# Patient Record
Sex: Female | Born: 1935 | Race: White | Hispanic: No | Smoking: Never smoker
Health system: Southern US, Community
[De-identification: ages and names within clinical notes are randomized; demographics above are authoritative.]

## PROBLEM LIST (undated history)

## (undated) DIAGNOSIS — I10 Essential (primary) hypertension: Secondary | ICD-10-CM

## (undated) DIAGNOSIS — I509 Heart failure, unspecified: Secondary | ICD-10-CM

## (undated) DIAGNOSIS — I4891 Unspecified atrial fibrillation: Secondary | ICD-10-CM

## (undated) DIAGNOSIS — E119 Type 2 diabetes mellitus without complications: Secondary | ICD-10-CM

## (undated) DIAGNOSIS — I639 Cerebral infarction, unspecified: Secondary | ICD-10-CM

## (undated) HISTORY — PX: CHOLECYSTECTOMY: SHX55

## (undated) HISTORY — DX: Cerebral infarction, unspecified: I63.9

---

## 2009-04-05 ENCOUNTER — Inpatient Hospital Stay: Payer: Self-pay | Admitting: Specialist

## 2009-05-02 ENCOUNTER — Inpatient Hospital Stay: Payer: Self-pay | Admitting: Internal Medicine

## 2009-06-04 ENCOUNTER — Ambulatory Visit: Payer: Self-pay | Admitting: Family

## 2009-07-02 ENCOUNTER — Ambulatory Visit: Payer: Self-pay | Admitting: Family

## 2009-07-15 ENCOUNTER — Ambulatory Visit: Payer: Self-pay | Admitting: Unknown Physician Specialty

## 2009-09-17 ENCOUNTER — Ambulatory Visit: Payer: Self-pay | Admitting: Family

## 2010-02-04 ENCOUNTER — Ambulatory Visit: Payer: Self-pay | Admitting: Family

## 2010-03-23 ENCOUNTER — Ambulatory Visit: Payer: Self-pay | Admitting: Family Medicine

## 2010-05-06 ENCOUNTER — Ambulatory Visit: Payer: Self-pay | Admitting: Family

## 2010-07-29 ENCOUNTER — Ambulatory Visit: Payer: Self-pay | Admitting: Family

## 2011-08-03 ENCOUNTER — Ambulatory Visit: Payer: Self-pay | Admitting: Vascular Surgery

## 2011-08-03 LAB — BASIC METABOLIC PANEL
Anion Gap: 10 (ref 7–16)
BUN: 16 mg/dL (ref 7–18)
Calcium, Total: 9.5 mg/dL (ref 8.5–10.1)
Creatinine: 1.07 mg/dL (ref 0.60–1.30)
EGFR (African American): 56 — ABNORMAL LOW
EGFR (Non-African Amer.): 48 — ABNORMAL LOW
Glucose: 149 mg/dL — ABNORMAL HIGH (ref 65–99)
Sodium: 141 mmol/L (ref 136–145)

## 2011-08-10 ENCOUNTER — Ambulatory Visit: Payer: Self-pay | Admitting: Vascular Surgery

## 2012-01-18 ENCOUNTER — Ambulatory Visit: Payer: Self-pay | Admitting: Unknown Physician Specialty

## 2012-07-03 ENCOUNTER — Ambulatory Visit: Payer: Self-pay | Admitting: Family Medicine

## 2012-07-19 ENCOUNTER — Ambulatory Visit: Payer: Self-pay | Admitting: Unknown Physician Specialty

## 2013-07-31 ENCOUNTER — Ambulatory Visit: Payer: Self-pay | Admitting: Unknown Physician Specialty

## 2013-11-08 DIAGNOSIS — E1129 Type 2 diabetes mellitus with other diabetic kidney complication: Secondary | ICD-10-CM | POA: Insufficient documentation

## 2013-11-08 DIAGNOSIS — E559 Vitamin D deficiency, unspecified: Secondary | ICD-10-CM | POA: Insufficient documentation

## 2014-08-04 NOTE — Op Note (Signed)
PATIENT NAME:  [name], [name] MR#:  [id number] DATE OF BIRTH:  07/17/1925  DATE OF PROCEDURE:  08/10/2011  PREOPERATIVE DIAGNOSIS: Atherosclerotic occlusive disease bilateral lower extremities with rest pain left lower extremity.   POSTOPERATIVE DIAGNOSIS: Atherosclerotic occlusive disease bilateral lower extremities with rest pain left lower extremity.   PROCEDURES PERFORMED:  1. Abdominal aortogram.  2. Bilateral lower extremity runoff, third order catheter placement left lower extremity.  3. Percutaneous transluminal angioplasty and stent placement left superficial femoral artery.  4. Percutaneous transluminal angioplasty of the peroneal to 3 mm.   PROCEDURE PERFORMED BY: Katha Cabal, MD   SEDATION: Versed 4 mg plus fentanyl 150 mcg administered IV. Continuous ECG, pulse oximetry, and cardiopulmonary monitoring was performed throughout the entire procedure by the interventional radiology nurse. Total sedation time was 45 minutes.   ACCESS: 6 French sheath, right common femoral artery.   FLUORO TIME: 13.6 minutes.   CONTRAST: Isovue 112 mL.   INDICATIONS: Ms. [name] is an 81 year old woman who presented to the office with increasing pain in her left lower extremity. Physical examination as well as noninvasive studies demonstrated significant deterioration of her arterial status and her ABI is now 0.39 consistent with critical limb ischemia. The risks and benefits for angiography with the hope for intervention were reviewed. All questions are answered. The patient agrees to proceed.   PROCEDURE: The patient is taken to the Special Procedures Suite and placed in the supine position. After adequate sedation is achieved, both groins are prepped and draped in a sterile fashion. 1% lidocaine is infiltrated in the soft tissues overlying the right groin area. Ultrasound is placed in a sterile sleeve. Ultrasound is utilized secondary to lack of appropriate landmarks and to avoid vascular injury.  Under direct ultrasound visualization, the common femoral artery is identified. It is echolucent, homogeneous, and pulsatile indicating patency. Image is recorded for the permanent record. Micropuncture needle is inserted into the anterior wall and microwire followed by micro sheath, J-wire followed by 5 French sheath and 5 French pigtail catheter. Pigtail catheter is positioned at the level of T12 and bolus injection of contrast is utilized to demonstrate the aorta in an AP projection. The pigtail catheter is then repositioned to above the bifurcation and an RAO projection is obtained. Pigtail catheter and stiff angled Glidewire are then used to cross the aortic bifurcation. The pigtail catheter is positioned in the SFA. This was after AP projection of the femorals down through the knee level is obtained bilaterally. Using a stiff angled Glidewire, the 5 [place] sheath is exchanged for a 6 [place] Ansel. 4000 units of heparin is given and an additional 1000 units of heparin is given later in the case. Using a CXI 0.035 and the stiff angled Glidewire, the SFA and popliteal occlusion is crossed. Confirmation of intraluminal positioning is made with the CXI catheter. High-grade greater than 80% stenosis of the peroneal is noted as well distally. Peroneal is patent. Anterior tibial and posterior tibial are occluded.   0.014 wire is then advanced through the CXI catheter and a 3 x 6 balloon is advanced across the peroneal lesion and inflated to 14 atmospheres for one minute. A 4 x 30 Ultraverse balloon is then used to angioplasty from the mid popliteal up to the common femoral artery. Follow-up angiography demonstrates the SFA and popliteal are now patent but there is a flow-limiting dissection in the mid popliteal and this is covered with a 6 x 60 LifeStent. This is postdilated with a 5 x  6 balloon. Follow-up angiography of the more proximal SFA demonstrates several discrete areas that are still under dilated and a 5 x  22 Ultraverse balloon is used to dilate the SFA from the level of Hunter's canal to the common femoral artery. Follow-up angiography now demonstrates wide patency of the SFA throughout its entire course. There is a non-flow limiting dissection at Hunter's canal. There is patency of the stent which is well approximated in the popliteal and there is now patency of the peroneal artery in its proximal portion down to the foot.   Sheath is pulled into the right groin, oblique view is obtained, and a StarClose device deployed successfully without difficulty. There are no immediate complications.   INTERPRETATION: The abdominal aorta is opacified with a bolus injection of contrast. The aorta, bilateral common iliac and external iliac arteries are widely patent. Common femoral and profunda femoris are patent bilaterally. SFA on the right demonstrates several subtotal occlusions in Hunter's canal and there is poor visualization of the mid popliteal and distally.   On the left there is diffuse disease throughout the SFA which occludes at Hunter's canal and remains occluded into the mid popliteal at the level of the femoral condyles. The tibial vessels are severely diseased with occlusions of the anterior tibial and posterior tibial throughout their entire course. Peroneal is patent but has a greater than 80 to 90% stenosis in its proximal one third.   Following angioplasty to 3 mm, the peroneal artery is well treated and is widely patent. Following angioplasty to 4 mm, there is a flow-limiting dissection of the popliteal which is treated with a stent and there are still multiple undersized areas. However, following angioplasty of the SFA and popliteal to 5 mm, there is now complete resolution of the flow-limiting dissection status post stent and there is also patency of the SFA throughout its course.   SUMMARY: Successful recanalization of the left lower extremity as described above.    ____________________________ Katha Cabal, MD ggs:drc D: 08/10/2011 10:39:36 ET T: 08/10/2011 11:07:09 ET JOB#: [id number]  cc: Katha Cabal, MD, <Dictator> Sofie Hartigan, MD Katha Cabal MD ELECTRONICALLY SIGNED 08/20/2011 7:51

## 2014-09-19 DIAGNOSIS — N1832 Chronic kidney disease, stage 3b: Secondary | ICD-10-CM | POA: Insufficient documentation

## 2015-10-30 DIAGNOSIS — I340 Nonrheumatic mitral (valve) insufficiency: Secondary | ICD-10-CM | POA: Insufficient documentation

## 2016-12-31 ENCOUNTER — Other Ambulatory Visit (INDEPENDENT_AMBULATORY_CARE_PROVIDER_SITE_OTHER): Payer: Self-pay | Admitting: Vascular Surgery

## 2016-12-31 DIAGNOSIS — I779 Disorder of arteries and arterioles, unspecified: Secondary | ICD-10-CM

## 2016-12-31 DIAGNOSIS — I739 Peripheral vascular disease, unspecified: Secondary | ICD-10-CM

## 2017-01-10 ENCOUNTER — Encounter (INDEPENDENT_AMBULATORY_CARE_PROVIDER_SITE_OTHER): Payer: Self-pay

## 2017-01-10 ENCOUNTER — Ambulatory Visit (INDEPENDENT_AMBULATORY_CARE_PROVIDER_SITE_OTHER): Payer: Self-pay | Admitting: Vascular Surgery

## 2017-05-30 ENCOUNTER — Emergency Department
Admission: EM | Admit: 2017-05-30 | Discharge: 2017-05-30 | Disposition: A | Discharge status: Home / Self Care | Payer: Medicare Other | Attending: Emergency Medicine | Admitting: Emergency Medicine

## 2017-05-30 ENCOUNTER — Emergency Department: Payer: Medicare Other

## 2017-05-30 ENCOUNTER — Encounter: Payer: Self-pay | Admitting: Emergency Medicine

## 2017-05-30 DIAGNOSIS — R079 Chest pain, unspecified: Secondary | ICD-10-CM | POA: Insufficient documentation

## 2017-05-30 DIAGNOSIS — I110 Hypertensive heart disease with heart failure: Secondary | ICD-10-CM | POA: Insufficient documentation

## 2017-05-30 DIAGNOSIS — I509 Heart failure, unspecified: Secondary | ICD-10-CM | POA: Diagnosis not present

## 2017-05-30 DIAGNOSIS — E119 Type 2 diabetes mellitus without complications: Secondary | ICD-10-CM | POA: Diagnosis not present

## 2017-05-30 HISTORY — DX: Type 2 diabetes mellitus without complications: E11.9

## 2017-05-30 HISTORY — DX: Unspecified atrial fibrillation: I48.91

## 2017-05-30 HISTORY — DX: Heart failure, unspecified: I50.9

## 2017-05-30 HISTORY — DX: Essential (primary) hypertension: I10

## 2017-05-30 LAB — BASIC METABOLIC PANEL
Anion gap: 10 (ref 5–15)
BUN: 17 mg/dL (ref 6–20)
CO2: 28 mmol/L (ref 22–32)
CREATININE: 1.01 mg/dL — AB (ref 0.44–1.00)
Calcium: 8.9 mg/dL (ref 8.9–10.3)
Chloride: 101 mmol/L (ref 101–111)
GFR calc non Af Amer: 48 mL/min — ABNORMAL LOW (ref >60)
GFR, EST AFRICAN AMERICAN: 55 mL/min — AB (ref >60)
Glucose, Bld: 111 mg/dL — ABNORMAL HIGH (ref 65–99)
Potassium: 3.6 mmol/L (ref 3.5–5.1)
Sodium: 139 mmol/L (ref 135–145)

## 2017-05-30 LAB — CBC
HEMATOCRIT: 46.5 % (ref 35.0–47.0)
HEMOGLOBIN: 15.5 g/dL (ref 12.0–16.0)
MCH: 32.3 pg (ref 26.0–34.0)
MCHC: 33.3 g/dL (ref 32.0–36.0)
MCV: 97.1 fL (ref 80.0–100.0)
Platelets: 198 10*3/uL (ref 150–440)
RBC: 4.79 MIL/uL (ref 3.80–5.20)
RDW: 13.5 % (ref 11.5–14.5)
WBC: 7.1 10*3/uL (ref 3.6–11.0)

## 2017-05-30 LAB — TROPONIN I: Troponin I: <0.03 ng/mL (ref <0.03)

## 2017-05-30 LAB — PROTIME-INR
INR: 2.24
Prothrombin Time: 24.6 seconds — ABNORMAL HIGH (ref 11.4–15.2)

## 2017-05-30 MED ORDER — ACETAMINOPHEN 500 MG PO TABS
1000.0000 mg | ORAL_TABLET | Freq: Once | ORAL | Status: AC
  Administered 2017-05-30: 1000 mg via ORAL
  Filled 2017-05-30: qty 2

## 2017-05-30 MED ORDER — ONDANSETRON 4 MG PO TBDP
4.0000 mg | ORAL_TABLET | Freq: Once | ORAL | Status: AC
  Administered 2017-05-30: 4 mg via ORAL
  Filled 2017-05-30: qty 1

## 2017-05-30 NOTE — ED Provider Notes (Signed)
[facility] Emergency Department Provider Note       Time seen: ----------------------------------------- 1:30 PM on 05/30/2017 -----------------------------------------   I have reviewed the triage vital signs and the nursing notes.  HISTORY   Chief Complaint Chest Pain and Shortness of Breath    HPI Consepcion Utt Carlson is a 84 y.o. female with a history of atrial fibrillation, CHF, diabetes and hypertension who presents to the ED for chest pain or shortness of breath.  Discomfort is 8 out of 10 on the right and left.  Family describes somewhat of a poor appetite since yesterday.  Patient has chest pain radiating to the right scapular area with a dry cough.  Past Medical History:  Diagnosis Date  . A-fib ([place])   . CHF (congestive heart failure) ([place])   . Diabetes mellitus without complication ([place])   . Hypertension     There are no active problems to display for this patient.   Past Surgical History:  Procedure Laterality Date  . CHOLECYSTECTOMY      Allergies Sulfa antibiotics  Social History Social History   Tobacco Use  . Smoking status: Never Smoker  . Smokeless tobacco: Never Used  Substance Use Topics  . Alcohol use: No    Frequency: Never  . Drug use: No    Review of Systems Constitutional: Negative for fever. Cardiovascular: Positive for chest pain Respiratory: Negative for shortness of breath.  Positive for dry cough Gastrointestinal: Negative for abdominal pain, vomiting and diarrhea. Genitourinary: Negative for dysuria. Musculoskeletal: Negative for back pain. Skin: Negative for rash. Neurological: Negative for headaches, focal weakness or numbness.  All systems negative/normal/unremarkable except as stated in the HPI  ____________________________________________   PHYSICAL EXAM:  VITAL SIGNS: ED Triage Vitals  Enc Vitals Group     BP 05/30/17 1100 (!) 184/70     Pulse Rate 05/30/17 1100 84     Resp 05/30/17 1100  20     Temp 05/30/17 1100 98.2 F (36.8 C)     Temp Source 05/30/17 1100 Oral     SpO2 05/30/17 1100 96 %     Weight 05/30/17 1101 132 lb (59.9 kg)     Height --      Head Circumference --      Peak Flow --      Pain Score 05/30/17 1100 9     Pain Loc --      Pain Edu? --      Excl. in [place]? --     Constitutional: Alert and oriented. Well appearing and in no distress. Eyes: Conjunctivae are normal. Normal extraocular movements. ENT   Head: Normocephalic and atraumatic.   Nose: No congestion/rhinnorhea.   Mouth/Throat: Mucous membranes are moist.   Neck: No stridor. Cardiovascular: Normal rate, regular rhythm.  Slight systolic murmur noted Respiratory: Normal respiratory effort without tachypnea nor retractions. Breath sounds are clear and equal bilaterally. No wheezes/rales/rhonchi. Gastrointestinal: Soft and nontender. Normal bowel sounds Musculoskeletal: Nontender with normal range of motion in extremities. No lower extremity tenderness nor edema. Neurologic:  Normal speech and language. No gross focal neurologic deficits are appreciated.  Skin:  Skin is warm, dry and intact. No rash noted. Psychiatric: Mood and affect are normal. Speech and behavior are normal.  ____________________________________________  EKG: Interpreted by me.  Atrial fibrillation with a rate of 87 bpm, possible septal infarct age-indeterminate, normal QRS size, normal QT  ____________________________________________  ED COURSE:  As part of my medical decision making, I reviewed the following data  within the [place] History obtained from family if available, nursing notes, old chart and ekg, as well as notes from prior ED visits. Patient presented for chest pain or shortness of breath, we will assess with labs and imaging as indicated at this time. Clinical Course as of May 30 1328  Mon May 30, 2017  1309 Troponin I: <0.03 [RN]    Clinical Course User Index [RN] Dia Sitter, Student-PA   Procedures ____________________________________________   LABS (pertinent positives/negatives)  Labs Reviewed  BASIC METABOLIC PANEL - Abnormal; Notable for the following components:      Result Value   Glucose, Bld 111 (*)    Creatinine, Ser 1.01 (*)    GFR calc non Af Amer 48 (*)    GFR calc Af Amer 55 (*)    All other components within normal limits  PROTIME-INR - Abnormal; Notable for the following components:   Prothrombin Time 24.6 (*)    All other components within normal limits  CBC  TROPONIN I  TROPONIN I    RADIOLOGY Images were viewed by me  Chest x-ray IMPRESSION: Cardiomegaly. COPD/chronic changes. No active disease. ____________________________________________  DIFFERENTIAL DIAGNOSIS   Unstable angina, PE, pneumonia, dissection, pneumothorax, musculoskeletal pain, GERD  FINAL ASSESSMENT AND PLAN  Chest pain, shortness of breath   Plan: Patient had presented for chest pain and shortness of breath. Patient's labs were reassuring. Patient's imaging are also reassuring.  No clear etiology for her symptoms.  She is cleared for close outpatient follow-up.   Laurence Aly, MD   Note: This note was generated in part or whole with voice recognition software. Voice recognition is usually quite accurate but there are transcription errors that can and very often do occur. I apologize for any typographical errors that were not detected and corrected.     Earleen Newport, MD 05/30/17 [phone]

## 2017-05-30 NOTE — ED Triage Notes (Signed)
Pt with chest pain and shortness of breath.

## 2017-06-22 DIAGNOSIS — I350 Nonrheumatic aortic (valve) stenosis: Secondary | ICD-10-CM | POA: Insufficient documentation

## 2017-06-22 DIAGNOSIS — I4819 Other persistent atrial fibrillation: Secondary | ICD-10-CM | POA: Insufficient documentation

## 2017-07-18 ENCOUNTER — Encounter (INDEPENDENT_AMBULATORY_CARE_PROVIDER_SITE_OTHER): Payer: Self-pay | Admitting: Vascular Surgery

## 2017-07-18 ENCOUNTER — Ambulatory Visit (INDEPENDENT_AMBULATORY_CARE_PROVIDER_SITE_OTHER): Payer: Medicare Other | Admitting: Vascular Surgery

## 2017-07-18 DIAGNOSIS — I739 Peripheral vascular disease, unspecified: Secondary | ICD-10-CM

## 2017-07-18 DIAGNOSIS — E1151 Type 2 diabetes mellitus with diabetic peripheral angiopathy without gangrene: Secondary | ICD-10-CM

## 2017-07-18 DIAGNOSIS — I10 Essential (primary) hypertension: Secondary | ICD-10-CM

## 2017-07-18 DIAGNOSIS — Z794 Long term (current) use of insulin: Secondary | ICD-10-CM | POA: Diagnosis not present

## 2017-07-18 DIAGNOSIS — E782 Mixed hyperlipidemia: Secondary | ICD-10-CM | POA: Diagnosis not present

## 2017-07-18 DIAGNOSIS — M79605 Pain in left leg: Secondary | ICD-10-CM | POA: Diagnosis not present

## 2017-07-19 ENCOUNTER — Encounter (INDEPENDENT_AMBULATORY_CARE_PROVIDER_SITE_OTHER): Payer: Self-pay | Admitting: Vascular Surgery

## 2017-07-19 DIAGNOSIS — E119 Type 2 diabetes mellitus without complications: Secondary | ICD-10-CM | POA: Insufficient documentation

## 2017-07-19 DIAGNOSIS — I739 Peripheral vascular disease, unspecified: Secondary | ICD-10-CM | POA: Insufficient documentation

## 2017-07-19 DIAGNOSIS — M79605 Pain in left leg: Secondary | ICD-10-CM | POA: Insufficient documentation

## 2017-07-19 DIAGNOSIS — E1122 Type 2 diabetes mellitus with diabetic chronic kidney disease: Secondary | ICD-10-CM | POA: Insufficient documentation

## 2017-07-19 DIAGNOSIS — I10 Essential (primary) hypertension: Secondary | ICD-10-CM | POA: Insufficient documentation

## 2017-07-19 DIAGNOSIS — E785 Hyperlipidemia, unspecified: Secondary | ICD-10-CM | POA: Insufficient documentation

## 2017-07-19 NOTE — Progress Notes (Signed)
MRN : [id number]  [name] is a 84 y.o. (Nov 28, 1925) female who presents with chief complaint of  Chief Complaint  Patient presents with  . Follow-up    Left leg painful  .  History of Present Illness: The patient is seen for evaluation of painful left lower extremity. Patient notes the pain occurred abruptly and is located in the left lateral thigh area.  She saw her primary who thought it was a hematoma.  She denies any black and bleu appearing at the knee level  The patient denies rest pain or dangling of an extremity off the side of the bed during the night for relief. No open wounds or sores at this time. No history of DVT or phlebitis. No prior interventions or surgeries.  There is a  history of back problems and DJD of the lumbar and sacral spine.    Current Meds  Medication Sig  . allopurinol (ZYLOPRIM) 100 MG tablet Take 100 mg by mouth daily.  Marland Kitchen diltiazem (CARDIZEM CD) 120 MG 24 hr capsule Take 1 capsule by mouth daily.  . furosemide (LASIX) 20 MG tablet Take 20 mg by mouth 2 (two) times daily.  Marland Kitchen HUMALOG KWIKPEN 100 UNIT/ML KiwkPen Inject 8-10 Units into the skin 3 (three) times daily before meals.  Marland Kitchen LEVEMIR FLEXTOUCH 100 UNIT/ML Pen   . levothyroxine (SYNTHROID, LEVOTHROID) 88 MCG tablet Take 88 mcg by mouth every morning.  Marland Kitchen lisinopril (PRINIVIL,ZESTRIL) 40 MG tablet Take 40 mg by mouth daily.  Marland Kitchen lovastatin (MEVACOR) 20 MG tablet Take 20 mg by mouth at bedtime.  . meclizine (ANTIVERT) 25 MG tablet   . metFORMIN (GLUCOPHAGE) 500 MG tablet Take 500 mg by mouth 2 (two) times daily.  Marland Kitchen warfarin (COUMADIN) 2 MG tablet Take 2 mg by mouth daily.    Past Medical History:  Diagnosis Date  . A-fib ([place])   . CHF (congestive heart failure) ([place])   . Diabetes mellitus without complication ([place])   . Hypertension     Past Surgical History:  Procedure Laterality Date  . CHOLECYSTECTOMY      Social History Social History   Tobacco Use  . Smoking status: Never  Smoker  . Smokeless tobacco: Never Used  Substance Use Topics  . Alcohol use: No    Frequency: Never  . Drug use: No    Family History History reviewed. No pertinent family history. No family history of bleeding/clotting disorders, porphyria or autoimmune disease   Allergies  Allergen Reactions  . Sulfa Antibiotics      REVIEW OF SYSTEMS (Negative unless checked)  Constitutional: [] Weight loss  [] Fever  [] Chills Cardiac: [] Chest pain   [] Chest pressure   [] Palpitations   [] Shortness of breath when laying flat   [] Shortness of breath with exertion. Vascular:  [] Pain in legs with walking   [x] Pain in legs at rest  [] History of DVT   [] Phlebitis   [x] Swelling in legs   [] Varicose veins   [] Non-healing ulcers Pulmonary:   [] Uses home oxygen   [] Productive cough   [] Hemoptysis   [] Wheeze  [] COPD   [] Asthma Neurologic:  [] Dizziness   [] Seizures   [] History of stroke   [] History of TIA  [] Aphasia   [] Vissual changes   [] Weakness or numbness in arm   [] Weakness or numbness in leg Musculoskeletal:   [] Joint swelling   [x] Joint pain   [x] Low back pain Hematologic:  [] Easy bruising  [] Easy bleeding   [] Hypercoagulable state   [] Anemic Gastrointestinal:  [] Diarrhea   []   Vomiting  [] Gastroesophageal reflux/heartburn   [] Difficulty swallowing. Genitourinary:  [] Chronic kidney disease   [] Difficult urination  [] Frequent urination   [] Blood in urine Skin:  [] Rashes   [] Ulcers  Psychological:  [] History of anxiety   []  History of major depression.  Physical Examination  Vitals:   07/18/17 1000  BP: (!) 176/95  Pulse: 93  Resp: 14  Weight: 58.1 kg (128 lb)  Height: 5\' 3"  (1.6 m)   Body mass index is 22.67 kg/m. Gen: WD/WN, NAD Head: [place]/AT, No temporalis wasting.  Ear/Nose/Throat: Hearing grossly intact, nares w/o erythema or drainage, poor dentition Eyes: PER, EOMI, sclera nonicteric.  Neck: Supple, no masses.  No bruit or JVD.  Pulmonary:  Good air movement, clear to auscultation  bilaterally, no use of accessory muscles.  Cardiac: RRR, normal S1, S2, no Murmurs. Vascular:  Vessel Right Left  Radial Palpable Palpable  PT Not Palpable Not Palpable  DP Not Palpable Not Palpable  Gastrointestinal: soft, non-distended. No guarding/no peritoneal signs.  Musculoskeletal: M/S 5/5 throughout.  No deformity or atrophy.  Neurologic: CN 2-12 intact. Pain and light touch intact in extremities.  Symmetrical.  Speech is fluent. Motor exam as listed above. Psychiatric: Judgment intact, Mood & affect appropriate for pt's clinical situation. Dermatologic: No rashes or ulcers noted.  No changes consistent with cellulitis. Lymph : No Cervical lymphadenopathy, no lichenification or skin changes of chronic lymphedema.  CBC Lab Results  Component Value Date   WBC 7.1 05/30/2017   HGB 15.5 05/30/2017   HCT 46.5 05/30/2017   MCV 97.1 05/30/2017   PLT 198 05/30/2017    BMET    Component Value Date/Time   NA 139 05/30/2017 1118   NA 141 08/03/2011 0729   K 3.6 05/30/2017 1118   K 3.6 08/03/2011 0729   CL 101 05/30/2017 1118   CL 105 08/03/2011 0729   CO2 28 05/30/2017 1118   CO2 26 08/03/2011 0729   GLUCOSE 111 (H) 05/30/2017 1118   GLUCOSE 149 (H) 08/03/2011 0729   BUN 17 05/30/2017 1118   BUN 16 08/03/2011 0729   CREATININE 1.01 (H) 05/30/2017 1118   CREATININE 1.07 08/03/2011 0729   CALCIUM 8.9 05/30/2017 1118   CALCIUM 9.5 08/03/2011 0729   GFRNONAA 48 (L) 05/30/2017 1118   GFRNONAA 48 (L) 08/03/2011 0729   GFRAA 55 (L) 05/30/2017 1118   GFRAA 56 (L) 08/03/2011 0729   CrCl cannot be calculated (Patient's most recent lab result is older than the maximum 21 days allowed.).  COAG Lab Results  Component Value Date   INR 2.24 05/30/2017    Radiology No results found.   Assessment/Plan 1. Leg pain, lateral, left Recommend:  Patient should undergo arterial duplex of the lower extremity ASAP because there has been a significant deterioration in the patient's  lower extremity symptoms.  The patient states they are having increased pain and a marked decrease in the distance that they can walk.  The risks and benefits as well as the alternatives were discussed in detail with the patient.  All questions were answered.  Patient agrees to proceed and understands this could be a prelude to angiography and intervention.  The patient will follow up with me in the office to review the studies.   - VAS [place] LOWER EXTREMITY ARTERIAL DUPLEX; Future  2. PAD (peripheral artery disease) ([place]) See #1 - VAS [place] ABI WITH/WO TBI; Future  3. Type 2 diabetes mellitus with diabetic peripheral angiopathy without gangrene, with long-term current use of insulin (  [place]) Continue hypoglycemic medications as already ordered, these medications have been reviewed and there are no changes at this time.  Hgb A1C to be monitored as already arranged by primary service   4. Mixed hyperlipidemia Continue statin as ordered and reviewed, no changes at this time   5. Essential hypertension Continue antihypertensive medications as already ordered, these medications have been reviewed and there are no changes at this time.     [name], MD  07/19/2017 9:27 AM

## 2017-07-20 ENCOUNTER — Ambulatory Visit (INDEPENDENT_AMBULATORY_CARE_PROVIDER_SITE_OTHER): Payer: Medicare Other

## 2017-07-20 ENCOUNTER — Encounter (INDEPENDENT_AMBULATORY_CARE_PROVIDER_SITE_OTHER): Payer: Self-pay | Admitting: Vascular Surgery

## 2017-07-20 ENCOUNTER — Ambulatory Visit (INDEPENDENT_AMBULATORY_CARE_PROVIDER_SITE_OTHER): Payer: Medicare Other | Admitting: Vascular Surgery

## 2017-07-20 VITALS — BP 177/93 | HR 74 | Resp 16 | Ht 63.0 in | Wt 127.6 lb

## 2017-07-20 DIAGNOSIS — I10 Essential (primary) hypertension: Secondary | ICD-10-CM

## 2017-07-20 DIAGNOSIS — I739 Peripheral vascular disease, unspecified: Secondary | ICD-10-CM | POA: Diagnosis not present

## 2017-07-20 DIAGNOSIS — M79605 Pain in left leg: Secondary | ICD-10-CM

## 2017-07-20 DIAGNOSIS — Z794 Long term (current) use of insulin: Secondary | ICD-10-CM | POA: Diagnosis not present

## 2017-07-20 DIAGNOSIS — E1151 Type 2 diabetes mellitus with diabetic peripheral angiopathy without gangrene: Secondary | ICD-10-CM

## 2017-07-20 DIAGNOSIS — E782 Mixed hyperlipidemia: Secondary | ICD-10-CM

## 2017-07-20 NOTE — Progress Notes (Signed)
Subjective:    Patient ID: [name], female    DOB: 1925/10/09, 84 y.o.   MRN: [id number] Chief Complaint  Patient presents with  . Follow-up    pt conv abi,lle art   Patient was last seen on July 18, 2017 for evaluation of left lower extremity pain.  The patient presents today to review vascular studies.  The patient's left thigh pain is stable.  The patient underwent a bilateral ABI which was notable for monophasic tibials and abnormal great toe waveforms bilaterally.  A right lower extremity arterial duplex was notable for a 75-99% stenosis at the distal SFA with monophasic blood flow distally.  A left lower extremity arterial duplex was notable for biphasic blood flow transitioning to monophasic at the distal popliteal artery where a 50-74% stenosis was noted in the popliteal stent.  Left posterior tibial artery is occluded.  The patient denies any rest pain or ulceration to the bilateral lower extremity.  The patient denies any fever, nausea vomiting.  Review of Systems  Constitutional: Negative.   HENT: Negative.   Eyes: Negative.   Respiratory: Negative.   Cardiovascular:       Left thigh pain Peripheral artery disease  Gastrointestinal: Negative.   Endocrine: Negative.   Genitourinary: Negative.   Musculoskeletal: Negative.   Skin: Negative.   Allergic/Immunologic: Negative.   Neurological: Negative.   Hematological: Negative.   Psychiatric/Behavioral: Negative.       Objective:   Physical Exam  Constitutional: She is oriented to person, place, and time. She appears well-developed and well-nourished. No distress.  HENT:  Head: Normocephalic and atraumatic.  Eyes: Pupils are equal, round, and reactive to light. Conjunctivae are normal.  Neck: Normal range of motion.  Cardiovascular: Normal rate, regular rhythm, normal heart sounds and intact distal pulses.  Pulses:      Radial pulses are 2+ on the right side, and 2+ on the left side.  Hard to palpate pedal pulses  however the bilateral feet are warm  Pulmonary/Chest: Effort normal and breath sounds normal.  Musculoskeletal: Normal range of motion.  Neurological: She is alert and oriented to person, place, and time.  Skin: Skin is warm and dry. She is not diaphoretic.  Psychiatric: She has a normal mood and affect. Her behavior is normal. Judgment and thought content normal.  Vitals reviewed.  BP (!) 177/93 (BP Location: Right Arm)   Pulse 74   Resp 16   Ht 5\' 3"  (1.6 m)   Wt 127 lb 9.6 oz (57.9 kg)   BMI 22.60 kg/m   Past Medical History:  Diagnosis Date  . A-fib ([place])   . CHF (congestive heart failure) ([place])   . Diabetes mellitus without complication ([place])   . Hypertension    Social History   Socioeconomic History  . Marital status: Married    Spouse name: Not on file  . Number of children: Not on file  . Years of education: Not on file  . Highest education level: Not on file  Occupational History  . Not on file  Social Needs  . Financial resource strain: Not on file  . Food insecurity:    Worry: Not on file    Inability: Not on file  . Transportation needs:    Medical: Not on file    Non-medical: Not on file  Tobacco Use  . Smoking status: Never Smoker  . Smokeless tobacco: Never Used  Substance and Sexual Activity  . Alcohol use: No    Frequency:  Never  . Drug use: No  . Sexual activity: Not on file  Lifestyle  . Physical activity:    Days per week: Not on file    Minutes per session: Not on file  . Stress: Not on file  Relationships  . Social connections:    Talks on phone: Not on file    Gets together: Not on file    Attends religious service: Not on file    Active member of club or organization: Not on file    Attends meetings of clubs or organizations: Not on file    Relationship status: Not on file  . Intimate partner violence:    Fear of current or ex partner: Not on file    Emotionally abused: Not on file    Physically abused: Not on file    Forced  sexual activity: Not on file  Other Topics Concern  . Not on file  Social History Narrative  . Not on file   Past Surgical History:  Procedure Laterality Date  . CHOLECYSTECTOMY     No family history on file.  Allergies  Allergen Reactions  . Sulfa Antibiotics       Assessment & Plan:  Patient was last seen on July 18, 2017 for evaluation of left lower extremity pain.  The patient presents today to review vascular studies.  The patient's left thigh pain is stable.  The patient underwent a bilateral ABI which was notable for monophasic tibials and abnormal great toe waveforms bilaterally.  A right lower extremity arterial duplex was notable for a 75-99% stenosis at the distal SFA with monophasic blood flow distally.  A left lower extremity arterial duplex was notable for biphasic blood flow transitioning to monophasic at the distal popliteal artery where a 50-74% stenosis was noted in the popliteal stent.  Left posterior tibial artery is occluded.  The patient denies any rest pain or ulceration to the bilateral lower extremity.  The patient denies any fever, nausea vomiting.  1. PAD (peripheral artery disease) ([place]) - Stable Patient with a past medical history of peripheral artery disease with endovascular intervention to the left popliteal artery with stent Patient noted to have worsening disease to the bilateral lower extremity on arterial duplex today Recommend a right lower extremity angiogram with possible intervention to assess the patient's anatomy and for appropriate correct the area of stenosis in the distal SFA and attempt to revascularize the patient's leg.  I explained to the patient that this area of stenosis would likely worsen over time which could create an ischemic event in the future. At this time, the patient would like to think about it.  If the patient decides to move forward with the procedure she will call our office. I will bring the patient back in 6 months for a  repeat bilateral lower extremity arterial duplex to continue to monitor her disease as she may also need a left lower extremity intervention in the future. I have discussed with the patient at length the risk factors for and pathogenesis of atherosclerotic disease and encouraged a healthy diet, regular exercise regimen and blood pressure / glucose control.  The patient was encouraged to call the office in the interim if he experiences any claudication like symptoms, rest pain or ulcers to his feet / toes.  - VAS [place] ABI WITH/WO TBI; Future - VAS [place] LOWER EXTREMITY ARTERIAL DUPLEX; Future  2. Type 2 diabetes mellitus with diabetic peripheral angiopathy without gangrene, with long-term current use of insulin (  [place]) - Stable Encouraged good control as its slows the progression of atherosclerotic disease  3. Mixed hyperlipidemia - Stable Encouraged good control as its slows the progression of atherosclerotic disease  4. Essential hypertension - Stable Encouraged good control as its slows the progression of atherosclerotic disease  Current Outpatient Medications on File Prior to Visit  Medication Sig Dispense Refill  . allopurinol (ZYLOPRIM) 100 MG tablet Take 100 mg by mouth daily.    Marland Kitchen diltiazem (CARDIZEM CD) 120 MG 24 hr capsule Take 1 capsule by mouth daily.    . furosemide (LASIX) 20 MG tablet Take 20 mg by mouth 2 (two) times daily.    Marland Kitchen HUMALOG KWIKPEN 100 UNIT/ML KiwkPen Inject 8-10 Units into the skin 3 (three) times daily before meals.    Marland Kitchen LEVEMIR FLEXTOUCH 100 UNIT/ML Pen     . levothyroxine (SYNTHROID, LEVOTHROID) 88 MCG tablet Take 88 mcg by mouth every morning.    Marland Kitchen lisinopril (PRINIVIL,ZESTRIL) 40 MG tablet Take 40 mg by mouth daily.    Marland Kitchen lovastatin (MEVACOR) 20 MG tablet Take 20 mg by mouth at bedtime.    . meclizine (ANTIVERT) 25 MG tablet     . metFORMIN (GLUCOPHAGE) 500 MG tablet Take 500 mg by mouth 2 (two) times daily.    Marland Kitchen warfarin (COUMADIN) 2 MG tablet Take 2 mg by mouth  daily.     No current facility-administered medications on file prior to visit.    There are no Patient Instructions on file for this visit. No follow-ups on file.  KIMBERLY A STEGMAYER, PA-C

## 2017-08-01 ENCOUNTER — Telehealth (INDEPENDENT_AMBULATORY_CARE_PROVIDER_SITE_OTHER): Payer: Self-pay

## 2017-08-01 NOTE — Telephone Encounter (Signed)
Patient called and stated that she has had time to think about getting the procedure done, and that she is ready to move forward with getting the procedure scheduled.  It looks like you suggested a right leg angio with possible intervention? Is this what the patient needs done first?

## 2017-08-02 ENCOUNTER — Telehealth (INDEPENDENT_AMBULATORY_CARE_PROVIDER_SITE_OTHER): Payer: Self-pay | Admitting: Vascular Surgery

## 2017-08-02 ENCOUNTER — Encounter (INDEPENDENT_AMBULATORY_CARE_PROVIDER_SITE_OTHER): Payer: Self-pay

## 2017-08-02 NOTE — Telephone Encounter (Signed)
Patient called and said that she needs to have her rt leg catheter done.

## 2017-08-02 NOTE — Telephone Encounter (Signed)
The patient will need intervention on both of her legs.  I would start with the right extremity and then the left.

## 2017-08-02 NOTE — Telephone Encounter (Signed)
I got the patient scheduled for the Right leg angio with possible intervention. The patient didn't want to move forward with getting the Left leg done yet. She is scheduled for Aug 14, 2017 at 7:15 am. I mailed the patient her information today, and I am now waiting to hear back from Dr. Alveria Apley office concerning if the patient needs to stop her Warfarin for the procedure?

## 2017-08-08 ENCOUNTER — Other Ambulatory Visit (INDEPENDENT_AMBULATORY_CARE_PROVIDER_SITE_OTHER): Payer: Self-pay | Admitting: Vascular Surgery

## 2017-08-11 MED ORDER — CEFAZOLIN SODIUM-DEXTROSE 2-4 GM/100ML-% IV SOLN
2.0000 g | Freq: Once | INTRAVENOUS | Status: AC
  Administered 2017-08-12: 2 g via INTRAVENOUS

## 2017-08-12 ENCOUNTER — Ambulatory Visit
Admission: RE | Admit: 2017-08-12 | Discharge: 2017-08-12 | Disposition: A | Discharge status: Home / Self Care | Payer: Medicare Other | Source: Ambulatory Visit | Attending: Vascular Surgery | Admitting: Vascular Surgery

## 2017-08-12 ENCOUNTER — Encounter: Payer: Self-pay | Admitting: *Deleted

## 2017-08-12 ENCOUNTER — Encounter
Admission: RE | Disposition: A | Discharge status: Home / Self Care | Payer: Self-pay | Source: Ambulatory Visit | Attending: Vascular Surgery

## 2017-08-12 DIAGNOSIS — E782 Mixed hyperlipidemia: Secondary | ICD-10-CM | POA: Insufficient documentation

## 2017-08-12 DIAGNOSIS — I110 Hypertensive heart disease with heart failure: Secondary | ICD-10-CM | POA: Diagnosis not present

## 2017-08-12 DIAGNOSIS — I509 Heart failure, unspecified: Secondary | ICD-10-CM | POA: Insufficient documentation

## 2017-08-12 DIAGNOSIS — Z79899 Other long term (current) drug therapy: Secondary | ICD-10-CM | POA: Diagnosis not present

## 2017-08-12 DIAGNOSIS — Z9049 Acquired absence of other specified parts of digestive tract: Secondary | ICD-10-CM | POA: Insufficient documentation

## 2017-08-12 DIAGNOSIS — Z7901 Long term (current) use of anticoagulants: Secondary | ICD-10-CM | POA: Insufficient documentation

## 2017-08-12 DIAGNOSIS — Z79890 Hormone replacement therapy (postmenopausal): Secondary | ICD-10-CM | POA: Diagnosis not present

## 2017-08-12 DIAGNOSIS — I70221 Atherosclerosis of native arteries of extremities with rest pain, right leg: Secondary | ICD-10-CM | POA: Diagnosis not present

## 2017-08-12 DIAGNOSIS — E1151 Type 2 diabetes mellitus with diabetic peripheral angiopathy without gangrene: Secondary | ICD-10-CM | POA: Diagnosis not present

## 2017-08-12 DIAGNOSIS — Z794 Long term (current) use of insulin: Secondary | ICD-10-CM | POA: Insufficient documentation

## 2017-08-12 DIAGNOSIS — I4891 Unspecified atrial fibrillation: Secondary | ICD-10-CM | POA: Insufficient documentation

## 2017-08-12 DIAGNOSIS — Z882 Allergy status to sulfonamides status: Secondary | ICD-10-CM | POA: Diagnosis not present

## 2017-08-12 DIAGNOSIS — I70223 Atherosclerosis of native arteries of extremities with rest pain, bilateral legs: Secondary | ICD-10-CM | POA: Insufficient documentation

## 2017-08-12 DIAGNOSIS — I739 Peripheral vascular disease, unspecified: Secondary | ICD-10-CM

## 2017-08-12 HISTORY — PX: LOWER EXTREMITY ANGIOGRAPHY: CATH118251

## 2017-08-12 LAB — CREATININE, SERUM
CREATININE: 1.02 mg/dL — AB (ref 0.44–1.00)
GFR calc Af Amer: 55 mL/min — ABNORMAL LOW (ref >60)
GFR calc non Af Amer: 47 mL/min — ABNORMAL LOW (ref >60)

## 2017-08-12 LAB — GLUCOSE, CAPILLARY: GLUCOSE-CAPILLARY: 91 mg/dL (ref 65–99)

## 2017-08-12 LAB — PROTIME-INR
INR: 1.13
PROTHROMBIN TIME: 14.4 s (ref 11.4–15.2)

## 2017-08-12 LAB — BUN: BUN: 15 mg/dL (ref 6–20)

## 2017-08-12 LAB — POTASSIUM (ARMC VASCULAR LAB ONLY): POTASSIUM (ARMC VASCULAR LAB): 3.1 — AB (ref 3.5–5.1)

## 2017-08-12 SURGERY — LOWER EXTREMITY ANGIOGRAPHY
Anesthesia: Moderate Sedation | Laterality: Right

## 2017-08-12 MED ORDER — HEPARIN SODIUM (PORCINE) 1000 UNIT/ML IJ SOLN
INTRAMUSCULAR | Status: AC
  Filled 2017-08-12: qty 1

## 2017-08-12 MED ORDER — HYDROMORPHONE HCL 1 MG/ML IJ SOLN: 1.0000 mg | Freq: Once | INTRAMUSCULAR | Status: DC | PRN

## 2017-08-12 MED ORDER — ONDANSETRON HCL 4 MG/2ML IJ SOLN: 4.0000 mg | Freq: Four times a day (QID) | INTRAMUSCULAR | Status: DC | PRN

## 2017-08-12 MED ORDER — FENTANYL CITRATE (PF) 100 MCG/2ML IJ SOLN
INTRAMUSCULAR | Status: AC
  Filled 2017-08-12: qty 2

## 2017-08-12 MED ORDER — MIDAZOLAM HCL 2 MG/2ML IJ SOLN
INTRAMUSCULAR | Status: DC | PRN
  Administered 2017-08-12 (×2): 0.5 mg via INTRAVENOUS
  Administered 2017-08-12: 1 mg via INTRAVENOUS

## 2017-08-12 MED ORDER — LABETALOL HCL 5 MG/ML IV SOLN: 10.0000 mg | INTRAVENOUS | Status: DC | PRN

## 2017-08-12 MED ORDER — CLOPIDOGREL BISULFATE 75 MG PO TABS: 75.0000 mg | ORAL_TABLET | Freq: Every day | ORAL | 1 refills | Status: DC

## 2017-08-12 MED ORDER — ASPIRIN EC 81 MG PO TBEC: 81.0000 mg | DELAYED_RELEASE_TABLET | ORAL | Status: DC

## 2017-08-12 MED ORDER — HYDRALAZINE HCL 20 MG/ML IJ SOLN: 5.0000 mg | INTRAMUSCULAR | Status: DC | PRN

## 2017-08-12 MED ORDER — SODIUM CHLORIDE 0.9 % IV SOLN
INTRAVENOUS | Status: DC
  Administered 2017-08-12: 08:00:00 via INTRAVENOUS

## 2017-08-12 MED ORDER — LIDOCAINE HCL (PF) 1 % IJ SOLN
INTRAMUSCULAR | Status: AC
  Filled 2017-08-12: qty 30

## 2017-08-12 MED ORDER — FENTANYL CITRATE (PF) 100 MCG/2ML IJ SOLN
INTRAMUSCULAR | Status: DC | PRN
  Administered 2017-08-12 (×2): 12.5 ug via INTRAVENOUS
  Administered 2017-08-12: 50 ug via INTRAVENOUS

## 2017-08-12 MED ORDER — SODIUM CHLORIDE 0.9% FLUSH: 3.0000 mL | Freq: Two times a day (BID) | INTRAVENOUS | Status: DC

## 2017-08-12 MED ORDER — MORPHINE SULFATE (PF) 4 MG/ML IV SOLN: 2.0000 mg | INTRAVENOUS | Status: DC | PRN

## 2017-08-12 MED ORDER — FAMOTIDINE 20 MG PO TABS: 40.0000 mg | ORAL_TABLET | ORAL | Status: DC | PRN

## 2017-08-12 MED ORDER — ASPIRIN EC 81 MG PO TBEC: 81.0000 mg | DELAYED_RELEASE_TABLET | Freq: Every day | ORAL | 2 refills | Status: DC

## 2017-08-12 MED ORDER — METHYLPREDNISOLONE SODIUM SUCC 125 MG IJ SOLR: 125.0000 mg | INTRAMUSCULAR | Status: DC | PRN

## 2017-08-12 MED ORDER — SODIUM CHLORIDE 0.9 % IV SOLN: 250.0000 mL | INTRAVENOUS | Status: DC | PRN

## 2017-08-12 MED ORDER — HEPARIN (PORCINE) IN NACL 1000-0.9 UT/500ML-% IV SOLN
INTRAVENOUS | Status: AC
  Filled 2017-08-12: qty 1000

## 2017-08-12 MED ORDER — MIDAZOLAM HCL 5 MG/5ML IJ SOLN
INTRAMUSCULAR | Status: AC
  Filled 2017-08-12: qty 5

## 2017-08-12 MED ORDER — CLOPIDOGREL BISULFATE 75 MG PO TABS: 75.0000 mg | ORAL_TABLET | Freq: Every day | ORAL | 0 refills | Status: AC

## 2017-08-12 MED ORDER — IOPAMIDOL (ISOVUE-300) INJECTION 61%
INTRAVENOUS | Status: DC | PRN
  Administered 2017-08-12: 90 mL via INTRA_ARTERIAL

## 2017-08-12 MED ORDER — HEPARIN SODIUM (PORCINE) 1000 UNIT/ML IJ SOLN
INTRAMUSCULAR | Status: DC | PRN
  Administered 2017-08-12: 4000 [IU] via INTRAVENOUS

## 2017-08-12 MED ORDER — CLOPIDOGREL BISULFATE 75 MG PO TABS
150.0000 mg | ORAL_TABLET | ORAL | Status: AC
  Administered 2017-08-12: 150 mg via ORAL

## 2017-08-12 MED ORDER — SODIUM CHLORIDE 0.9 % IV SOLN: Freq: Once | INTRAVENOUS | Status: DC

## 2017-08-12 MED ORDER — SODIUM CHLORIDE 0.9 % IV SOLN: INTRAVENOUS | Status: DC

## 2017-08-12 MED ORDER — SODIUM CHLORIDE 0.9% FLUSH: 3.0000 mL | INTRAVENOUS | Status: DC | PRN

## 2017-08-12 MED ORDER — CLOPIDOGREL BISULFATE 75 MG PO TABS
ORAL_TABLET | ORAL | Status: AC
  Filled 2017-08-12: qty 2

## 2017-08-12 MED ORDER — OXYCODONE HCL 5 MG PO TABS: 5.0000 mg | ORAL_TABLET | ORAL | Status: DC | PRN

## 2017-08-12 MED ORDER — ACETAMINOPHEN 325 MG PO TABS: 650.0000 mg | ORAL_TABLET | ORAL | Status: DC | PRN

## 2017-08-12 SURGICAL SUPPLY — 30 items
BALLN LUTONIX 5X220X130 (BALLOONS) ×6
BALLN LUTONIX DCB 5X80X130 (BALLOONS) ×3
BALLN ULTRASCORE 4X150X130 (BALLOONS) ×3
BALLN ULTRVRSE 3X40X150 (BALLOONS) ×3
BALLOON LUTONIX 5X220X130 (BALLOONS) ×2 IMPLANT
BALLOON LUTONIX DCB 5X80X130 (BALLOONS) ×1 IMPLANT
BALLOON ULTRASCORE 4X150X130 (BALLOONS) ×1 IMPLANT
BALLOON ULTRVRSE 3X40X150 (BALLOONS) ×1 IMPLANT
CATH CROSSER 14S OTW 146CM (CATHETERS) ×3 IMPLANT
CATH CXI SUPP ANG 4FR 135 (CATHETERS) ×1 IMPLANT
CATH CXI SUPP ANG 4FR 135CM (CATHETERS) ×3
CATH PIG 70CM (CATHETERS) ×3 IMPLANT
CATH SIDEKICK XL ST 110CM (SHEATH) ×3 IMPLANT
DEVICE PRESTO INFLATION (MISCELLANEOUS) ×3 IMPLANT
DEVICE STARCLOSE SE CLOSURE (Vascular Products) ×3 IMPLANT
DEVICE TORQUE (MISCELLANEOUS) ×3 IMPLANT
GLIDECATH ANGLED 4FR 120CM (CATHETERS) ×3 IMPLANT
GLIDEWIRE ANGLED SS 035X260CM (WIRE) ×3 IMPLANT
KIT FLOWMATE PROCEDURAL (KITS) ×3 IMPLANT
LIFESTENT SOLO 6X200X135 (Permanent Stent) ×3 IMPLANT
NEEDLE ENTRY 21GA 7CM ECHOTIP (NEEDLE) ×3 IMPLANT
PACK ANGIOGRAPHY (CUSTOM PROCEDURE TRAY) ×3 IMPLANT
SET INTRO CAPELLA COAXIAL (SET/KITS/TRAYS/PACK) ×3 IMPLANT
SHEATH BRITE TIP 5FRX11 (SHEATH) ×3 IMPLANT
SHEATH FLEXOR ANSEL2 7FRX45 (SHEATH) ×3 IMPLANT
SHIELD RADPAD SCOOP 12X17 (MISCELLANEOUS) ×3 IMPLANT
TUBING CONTRAST HIGH PRESS 72 (TUBING) ×3 IMPLANT
WIRE J 3MM .035X145CM (WIRE) ×3 IMPLANT
WIRE MAGIC TORQUE 315CM (WIRE) ×3 IMPLANT
WIRE SPARTACORE .014X300CM (WIRE) ×6 IMPLANT

## 2017-08-12 NOTE — OR Nursing (Signed)
Patient received from vir lab after procedure.  Left groin site bleeding with small hematoma formation lateral to pubis.  erin maynor, rn held manual pressure from 11:20 to 11:40. Site now clear and vss.

## 2017-08-12 NOTE — Op Note (Signed)
[facility] Percutaneous Study/Intervention Procedural Note   Date of Surgery: 08/12/2017  Surgeon:  Katha Cabal, MD.  Pre-operative Diagnosis: Atherosclerotic occlusive disease bilateral lower extremities with rest pain bilateral lower extremities  Post-operative diagnosis: Same  Procedure(s) Performed: 1. Introduction catheter into right lower extremity 3rd order catheter placement  2. Contrast injection right lower extremity for distal runoff   3. Crosser atherectomy of the right SFA and popliteal arteries 4.  Percutaneous transluminal angioplasty and stent placement right superficial femoral artery and popliteal             5.  Crosser atherectomy of the right anterior tibial artery                         6.     Percutaneous transluminal angioplasty of the right anterior tibial artery                        7.   Star close closure left common femoral arteriotomy  Anesthesia: Conscious sedation was administered under my direct supervision by the interventional radiology RN. IV Versed plus fentanyl were utilized. Continuous ECG, pulse oximetry and blood pressure was monitored throughout the entire procedure. Conscious sedation was for a total of 128 minutes.  Sheath: 7 [place] Ansell left common femoral  Contrast: 90 cc  Fluoroscopy Time: 19.1 minutes  Indications: [name] Minshall presents with rest pain associated with atherosclerotic changes of the lower extremities.  Right leg is worse than the left.  This represents limb threatening ischemia.  The risks and benefits for revascularization for limb salvage are reviewed all questions answered patient agrees to proceed.  Procedure: [name] [name] Stillman is a 84 y.o. y.o. female who was identified and appropriate procedural time out was performed. The patient was then placed supine on the table and prepped and draped in the usual sterile fashion.   Ultrasound  was placed in the sterile sleeve and the left groin was evaluated the left common femoral artery was echolucent and pulsatile indicating patency.  Image was recorded for the permanent record and under real-time visualization a microneedle was inserted into the common femoral artery microwire followed by a micro-sheath.  A J-wire was then advanced through the micro-sheath and a  5 [place] sheath was then inserted over a J-wire. J-wire was then advanced and a 5 French pigtail catheter was positioned at the level of T12. AP projection of the aorta was then obtained. Pigtail catheter was repositioned to above the bifurcation and a LAO view of the pelvis was obtained.  Subsequently a pigtail catheter with the stiff angle Glidewire was used to cross the aortic bifurcation the catheter wire were advanced down into the right distal external iliac artery. Oblique view of the femoral bifurcation was then obtained and subsequently the wire was reintroduced and the pigtail catheter negotiated into the SFA representing third order catheter placement. Distal runoff was then performed.  5000 units of heparin was then given and allowed to circulate and a 7 [place] Ansell sheath was advanced up and over the bifurcation and positioned in the femoral artery  The 14 S Crosser catheter was then prepped on the field and a straight micro catheter was advanced into the cul-de-sac of the SFA under magnified imaging in the LAO projection. Using the Crosser catheter the occlusion of the SFA and popliteal was negotiated.  Injection of contrast confirmed intraluminal positioning.  Angled glide catheter and  stiff angle Glidewire were then advanced down into the distal popliteal.  Distal runoff was then completed by hand injection through the catheter.  The catheter was then advanced down to the level of the occluded tibial. Catheter was exchanged for the micro catheter and the 14 S Crosser was reintroduced. The occluded anterior tibial  artery was then treated. Hand injection of contrast was used to verify that the occluded segment had been crossed and that intraluminal positioning had been secured.  A 3 mm x 40 mm Ultraverse balloon was then used to angioplasty the anterior tibial artery. Inflations was to 10 atm for 1 full minute. Follow-up imaging demonstrated patency of the tibial artery and the SFA and popliteal were now addressed  A 4 mm x 200 mm ultra score balloon was used to angioplasty the superficial femoral and popliteal arteries. Inflations were to 12 atmospheres for 2 minutes. Follow-up imaging demonstrated patency with adequate preparation of the vessel for a drug-coated balloon.  Subsequently, a 5 mm x 22 mm Lutonix drug-eluting balloon and then a 5 mm x 80 mm Lutonix drug-eluting balloon was utilized inflating to 12 atm for 2 full minutes. Follow-up imaging demonstrated greater than 50% residual stenosis and therefore a 6 mm x 200 mm life stent was deployed and subsequently postdilated with a 6 mm Lutonix drug-eluting balloon.  Distal runoff was then reassessed.  After review of these images the sheath is pulled into the left external iliac oblique of the common femoral is obtained and a Star close device deployed. There no immediate Complications.  Findings: The abdominal aorta is opacified with a bolus injection contrast. Renal arteries are single and moderately diseased with a 40 to 50% stenosis on the right and a 60% stenosis on the left. The aorta itself has diffuse disease but no hemodynamically significant lesions. The common and external iliac arteries are widely patent bilaterally.  The right common femoral is widely patent as is the profunda femoris.  The SFA does indeed have a significant stenosis which then tapers to an occlusion just above Hunter's canal.  There is reconstitution at the knee with diffuse disease to the level of the tibial plateau within the popliteal artery.  The distal popliteal  demonstrates increasing disease and the trifurcation is heavily diseased with occlusion of the anterior tibial and posterior tibial artery.  Peroneal arteries demonstrate patency but is moderately diseased down to the ankle.  There is reconstitution of the anterior tibial which also then crosses the ankle.  Following angioplasty anterior tibial tibial now is in-line flow and looks quite nice with less than 5% residual stenosis. Angioplasty of the SFA and popliteal there is significant residual stenosis and therefore 6 mm life stent are placed and postdilated to 5 mm with excellent result with less than 10% residual stenosis.    Disposition: Patient was taken to the recovery room in stable condition having tolerated the procedure well.  Belenda Cruise Raymondo Garcialopez 08/12/2017,11:17 AM

## 2017-08-12 NOTE — H&P (Signed)
[facility] History & Physical Update  The patient was interviewed and re-examined.  The patient's previous History and Physical has been reviewed and is unchanged.  There is no change in the plan of care. We plan to proceed with the scheduled procedure.  [name], MD  08/12/2017, 7:45 AM

## 2017-08-15 ENCOUNTER — Encounter: Payer: Self-pay | Admitting: Vascular Surgery

## 2017-08-17 ENCOUNTER — Encounter (INDEPENDENT_AMBULATORY_CARE_PROVIDER_SITE_OTHER): Payer: Medicare Other

## 2017-08-17 ENCOUNTER — Ambulatory Visit (INDEPENDENT_AMBULATORY_CARE_PROVIDER_SITE_OTHER): Payer: Medicare Other | Admitting: Vascular Surgery

## 2017-08-22 ENCOUNTER — Ambulatory Visit (INDEPENDENT_AMBULATORY_CARE_PROVIDER_SITE_OTHER): Payer: Medicare Other

## 2017-08-22 ENCOUNTER — Encounter (INDEPENDENT_AMBULATORY_CARE_PROVIDER_SITE_OTHER): Payer: Self-pay | Admitting: Vascular Surgery

## 2017-08-22 ENCOUNTER — Ambulatory Visit (INDEPENDENT_AMBULATORY_CARE_PROVIDER_SITE_OTHER): Payer: Medicare Other | Admitting: Vascular Surgery

## 2017-08-22 VITALS — BP 178/93 | HR 67 | Resp 14 | Ht 62.5 in | Wt 127.0 lb

## 2017-08-22 DIAGNOSIS — E1151 Type 2 diabetes mellitus with diabetic peripheral angiopathy without gangrene: Secondary | ICD-10-CM

## 2017-08-22 DIAGNOSIS — I739 Peripheral vascular disease, unspecified: Secondary | ICD-10-CM

## 2017-08-22 DIAGNOSIS — E782 Mixed hyperlipidemia: Secondary | ICD-10-CM | POA: Diagnosis not present

## 2017-08-22 DIAGNOSIS — Z794 Long term (current) use of insulin: Secondary | ICD-10-CM

## 2017-08-22 DIAGNOSIS — I10 Essential (primary) hypertension: Secondary | ICD-10-CM | POA: Diagnosis not present

## 2017-08-23 ENCOUNTER — Encounter (INDEPENDENT_AMBULATORY_CARE_PROVIDER_SITE_OTHER): Payer: Self-pay | Admitting: Vascular Surgery

## 2017-08-23 NOTE — Progress Notes (Signed)
MRN : [id number]  [name] is a 84 y.o. (06-05-1925) female who presents with chief complaint of  Chief Complaint  Patient presents with  . Follow-up    2 week [facility] f/u  .  History of Present Illness:  The patient returns to the office for followup and review status post angiogram with intervention.   Procedure(s) Performed 08/12/2017: 1. Introduction catheter into right lower extremity 3rd order catheter placement  2. Contrast injection right lower extremity for distal runoff   3. Crosser atherectomy of the right SFA and popliteal arteries 4.  Percutaneous transluminal angioplasty and stent placement right superficial femoral artery and popliteal             5.  Crosser atherectomy of the right anterior tibial artery                         6.     Percutaneous transluminal angioplasty of the right anterior tibial artery     The patient notes improvement in the lower extremity symptoms. No interval shortening of the patient's claudication distance or rest pain symptoms. Previous wounds have now healed.  No new ulcers or wounds have occurred since the last visit.  There have been no significant changes to the patient's overall health care.  The patient denies amaurosis fugax or recent TIA symptoms. There are no recent neurological changes noted. The patient denies history of DVT, PE or superficial thrombophlebitis. The patient denies recent episodes of angina or shortness of breath.   ABI's Rt=1.00 and Lt=0.72  (previous ABI's Rt=0.74 and Lt=1.09)   Current Meds  Medication Sig  . ACCU-CHEK AVIVA PLUS test strip   . allopurinol (ZYLOPRIM) 100 MG tablet Take 100 mg by mouth daily.  Marland Kitchen aspirin EC 81 MG tablet Take 1 tablet (81 mg total) by mouth daily.  . [EXPIRED] clopidogrel (PLAVIX) 75 MG tablet Take 1 tablet (75 mg total) by mouth daily for 10 doses.  Marland Kitchen diltiazem (CARDIZEM CD) 120 MG 24 hr capsule Take 120 mg by mouth  daily.   . furosemide (LASIX) 20 MG tablet Take 40 mg by mouth daily.   Marland Kitchen HUMALOG KWIKPEN 100 UNIT/ML KiwkPen Inject 8-10 Units into the skin 3 (three) times daily before meals. Per sliding scale  . LEVEMIR FLEXTOUCH 100 UNIT/ML Pen Inject 12 Units into the skin at bedtime.   Marland Kitchen levothyroxine (SYNTHROID, LEVOTHROID) 88 MCG tablet Take 88 mcg by mouth daily before breakfast.   . lisinopril (PRINIVIL,ZESTRIL) 40 MG tablet Take 40 mg by mouth daily.  Marland Kitchen lovastatin (MEVACOR) 20 MG tablet Take 20 mg by mouth at bedtime.  . meclizine (ANTIVERT) 25 MG tablet Take 25 mg by mouth 3 (three) times daily as needed for dizziness.   . metFORMIN (GLUCOPHAGE) 500 MG tablet Take 500 mg by mouth 2 (two) times daily with a meal.   . warfarin (COUMADIN) 2 MG tablet Take 2 mg by mouth at bedtime.     Past Medical History:  Diagnosis Date  . A-fib ([place])   . CHF (congestive heart failure) ([place])   . Diabetes mellitus without complication ([place])   . Hypertension     Past Surgical History:  Procedure Laterality Date  . CHOLECYSTECTOMY    . LOWER EXTREMITY ANGIOGRAPHY Right 08/12/2017   Procedure: LOWER EXTREMITY ANGIOGRAPHY;  Surgeon: Katha Cabal, MD;  Location: [facility];  Service: Cardiovascular;  Laterality: Right;    Social History Social History  Tobacco Use  . Smoking status: Never Smoker  . Smokeless tobacco: Never Used  Substance Use Topics  . Alcohol use: No    Frequency: Never  . Drug use: No    Family History History reviewed. No pertinent family history.  Allergies  Allergen Reactions  . Sulfa Antibiotics Hives     REVIEW OF SYSTEMS (Negative unless checked)  Constitutional: [] Weight loss  [] Fever  [] Chills Cardiac: [] Chest pain   [] Chest pressure   [] Palpitations   [] Shortness of breath when laying flat   [] Shortness of breath with exertion. Vascular:  [x] Pain in legs with walking   [] Pain in legs at rest  [] History of DVT   [] Phlebitis   [] Swelling in legs    [] Varicose veins   [] Non-healing ulcers Pulmonary:   [] Uses home oxygen   [] Productive cough   [] Hemoptysis   [] Wheeze  [] COPD   [] Asthma Neurologic:  [] Dizziness   [] Seizures   [] History of stroke   [] History of TIA  [] Aphasia   [] Vissual changes   [] Weakness or numbness in arm   [] Weakness or numbness in leg Musculoskeletal:   [] Joint swelling   [] Joint pain   [] Low back pain Hematologic:  [] Easy bruising  [] Easy bleeding   [] Hypercoagulable state   [] Anemic Gastrointestinal:  [] Diarrhea   [] Vomiting  [] Gastroesophageal reflux/heartburn   [] Difficulty swallowing. Genitourinary:  [] Chronic kidney disease   [] Difficult urination  [] Frequent urination   [] Blood in urine Skin:  [] Rashes   [] Ulcers  Psychological:  [] History of anxiety   []  History of major depression.  Physical Examination  Vitals:   08/22/17 1440  BP: (!) 178/93  Pulse: 67  Resp: 14  Weight: 127 lb (57.6 kg)  Height: 5' 2.5" (1.588 m)   Body mass index is 22.86 kg/m. Gen: WD/WN, NAD Head: [place]/AT, No temporalis wasting.  Ear/Nose/Throat: Hearing grossly intact, nares w/o erythema or drainage Eyes: PER, EOMI, sclera nonicteric.  Neck: Supple, no large masses.   Pulmonary:  Good air movement, no audible wheezing bilaterally, no use of accessory muscles.  Cardiac: RRR, no JVD Vascular:  Vessel Right Left  Radial Palpable Palpable  PT Not Palpable Not Palpable  DP Not Palpable Not Palpable  Gastrointestinal: Non-distended. No guarding/no peritoneal signs.  Musculoskeletal: M/S 5/5 throughout.  No deformity or atrophy.  Neurologic: CN 2-12 intact. Symmetrical.  Speech is fluent. Motor exam as listed above. Psychiatric: Judgment intact, Mood & affect appropriate for pt's clinical situation. Dermatologic: No rashes or ulcers noted.  No changes consistent with cellulitis. Lymph : No lichenification or skin changes of chronic lymphedema.  CBC Lab Results  Component Value Date   WBC 7.1 05/30/2017   HGB 15.5  05/30/2017   HCT 46.5 05/30/2017   MCV 97.1 05/30/2017   PLT 198 05/30/2017    BMET    Component Value Date/Time   NA 139 05/30/2017 1118   NA 141 08/03/2011 0729   K 3.6 05/30/2017 1118   K 3.6 08/03/2011 0729   CL 101 05/30/2017 1118   CL 105 08/03/2011 0729   CO2 28 05/30/2017 1118   CO2 26 08/03/2011 0729   GLUCOSE 111 (H) 05/30/2017 1118   GLUCOSE 149 (H) 08/03/2011 0729   BUN 15 08/12/2017 0734   BUN 16 08/03/2011 0729   CREATININE 1.02 (H) 08/12/2017 0734   CREATININE 1.07 08/03/2011 0729   CALCIUM 8.9 05/30/2017 1118   CALCIUM 9.5 08/03/2011 0729   GFRNONAA 47 (L) 08/12/2017 0734   GFRNONAA 48 (L) 08/03/2011 6295  GFRAA 55 (L) 08/12/2017 0734   GFRAA 56 (L) 08/03/2011 0729   Estimated Creatinine Clearance: 30.3 mL/min (A) (by C-G formula based on SCr of 1.02 mg/dL (H)).  COAG Lab Results  Component Value Date   INR 1.13 08/12/2017   INR 2.24 05/30/2017    Radiology No results found.  Assessment/Plan 1. PAD (peripheral artery disease) (HCC)  Recommend:  The patient has evidence of atherosclerosis of the lower extremities with claudication.  The patient does not voice lifestyle limiting changes at this point in time.  Noninvasive studies do not suggest clinically significant change.  No invasive studies, angiography or surgery at this time The patient should continue walking and begin a more formal exercise program.  The patient should continue antiplatelet therapy and aggressive treatment of the lipid abnormalities  No changes in the patient's medications at this time  2. Essential hypertension Continue antihypertensive medications as already ordered, these medications have been reviewed and there are no changes at this time.   3. Type 2 diabetes mellitus with diabetic peripheral angiopathy without gangrene, with long-term current use of insulin ([place]) Continue hypoglycemic medications as already ordered, these medications have been reviewed and  there are no changes at this time.  Hgb A1C to be monitored as already arranged by primary service   4. Mixed hyperlipidemia Continue statin as ordered and reviewed, no changes at this time     [name], MD  08/23/2017 8:38 PM

## 2017-10-26 ENCOUNTER — Other Ambulatory Visit: Payer: Self-pay | Admitting: Nephrology

## 2017-10-26 DIAGNOSIS — N183 Chronic kidney disease, stage 3 unspecified: Secondary | ICD-10-CM

## 2017-10-31 ENCOUNTER — Ambulatory Visit
Admission: RE | Admit: 2017-10-31 | Discharge: 2017-10-31 | Disposition: A | Discharge status: Home / Self Care | Payer: Medicare Other | Source: Ambulatory Visit | Attending: Nephrology | Admitting: Nephrology

## 2017-10-31 DIAGNOSIS — N183 Chronic kidney disease, stage 3 unspecified: Secondary | ICD-10-CM

## 2017-10-31 DIAGNOSIS — N281 Cyst of kidney, acquired: Secondary | ICD-10-CM | POA: Insufficient documentation

## 2017-11-30 DIAGNOSIS — Z8639 Personal history of other endocrine, nutritional and metabolic disease: Secondary | ICD-10-CM | POA: Insufficient documentation

## 2017-11-30 DIAGNOSIS — E1169 Type 2 diabetes mellitus with other specified complication: Secondary | ICD-10-CM | POA: Insufficient documentation

## 2017-11-30 DIAGNOSIS — E785 Hyperlipidemia, unspecified: Secondary | ICD-10-CM | POA: Insufficient documentation

## 2017-12-07 ENCOUNTER — Other Ambulatory Visit: Payer: Self-pay | Admitting: Hematology and Oncology

## 2017-12-07 ENCOUNTER — Inpatient Hospital Stay: Payer: Medicare Other | Attending: Hematology and Oncology | Admitting: Hematology and Oncology

## 2017-12-07 ENCOUNTER — Encounter: Payer: Self-pay | Admitting: Hematology and Oncology

## 2017-12-07 ENCOUNTER — Inpatient Hospital Stay: Payer: Medicare Other

## 2017-12-07 VITALS — BP 166/97 | HR 85 | Temp 98.0°F | Resp 18 | Ht 63.0 in | Wt 126.1 lb

## 2017-12-07 DIAGNOSIS — I4891 Unspecified atrial fibrillation: Secondary | ICD-10-CM

## 2017-12-07 DIAGNOSIS — Z7982 Long term (current) use of aspirin: Secondary | ICD-10-CM | POA: Insufficient documentation

## 2017-12-07 DIAGNOSIS — Z79899 Other long term (current) drug therapy: Secondary | ICD-10-CM | POA: Diagnosis not present

## 2017-12-07 DIAGNOSIS — N183 Chronic kidney disease, stage 3 (moderate): Secondary | ICD-10-CM

## 2017-12-07 DIAGNOSIS — Z800 Family history of malignant neoplasm of digestive organs: Secondary | ICD-10-CM | POA: Insufficient documentation

## 2017-12-07 DIAGNOSIS — Z794 Long term (current) use of insulin: Secondary | ICD-10-CM | POA: Diagnosis not present

## 2017-12-07 DIAGNOSIS — Z7901 Long term (current) use of anticoagulants: Secondary | ICD-10-CM | POA: Insufficient documentation

## 2017-12-07 DIAGNOSIS — R778 Other specified abnormalities of plasma proteins: Secondary | ICD-10-CM | POA: Insufficient documentation

## 2017-12-07 DIAGNOSIS — E1122 Type 2 diabetes mellitus with diabetic chronic kidney disease: Secondary | ICD-10-CM | POA: Diagnosis not present

## 2017-12-07 DIAGNOSIS — I130 Hypertensive heart and chronic kidney disease with heart failure and stage 1 through stage 4 chronic kidney disease, or unspecified chronic kidney disease: Secondary | ICD-10-CM | POA: Insufficient documentation

## 2017-12-07 LAB — COMPREHENSIVE METABOLIC PANEL
ALT: 14 U/L (ref 0–44)
AST: 26 U/L (ref 15–41)
Albumin: 4.4 g/dL (ref 3.5–5.0)
Alkaline Phosphatase: 83 U/L (ref 38–126)
Anion gap: 12 (ref 5–15)
BUN: 18 mg/dL (ref 8–23)
CO2: 26 mmol/L (ref 22–32)
Calcium: 9.2 mg/dL (ref 8.9–10.3)
Chloride: 102 mmol/L (ref 98–111)
Creatinine, Ser: 1.13 mg/dL — ABNORMAL HIGH (ref 0.44–1.00)
GFR calc Af Amer: 48 mL/min — ABNORMAL LOW (ref >60)
GFR calc non Af Amer: 41 mL/min — ABNORMAL LOW (ref >60)
Glucose, Bld: 98 mg/dL (ref 70–99)
Potassium: 3.7 mmol/L (ref 3.5–5.1)
Sodium: 140 mmol/L (ref 135–145)
Total Bilirubin: 0.6 mg/dL (ref 0.3–1.2)
Total Protein: 8.4 g/dL — ABNORMAL HIGH (ref 6.5–8.1)

## 2017-12-07 LAB — CBC WITH DIFFERENTIAL/PLATELET
Basophils Absolute: 0.1 10*3/uL (ref 0–0.1)
Basophils Relative: 1 %
Eosinophils Absolute: 0.2 10*3/uL (ref 0–0.7)
Eosinophils Relative: 2 %
HCT: 45.6 % (ref 35.0–47.0)
Hemoglobin: 15.1 g/dL (ref 12.0–16.0)
Lymphocytes Relative: 28 %
Lymphs Abs: 2.1 10*3/uL (ref 1.0–3.6)
MCH: 31.8 pg (ref 26.0–34.0)
MCHC: 33.2 g/dL (ref 32.0–36.0)
MCV: 96 fL (ref 80.0–100.0)
Monocytes Absolute: 0.8 10*3/uL (ref 0.2–0.9)
Monocytes Relative: 11 %
Neutro Abs: 4.4 10*3/uL (ref 1.4–6.5)
Neutrophils Relative %: 58 %
Platelets: 233 10*3/uL (ref 150–440)
RBC: 4.76 MIL/uL (ref 3.80–5.20)
RDW: 14.9 % — ABNORMAL HIGH (ref 11.5–14.5)
WBC: 7.5 10*3/uL (ref 3.6–11.0)

## 2017-12-07 NOTE — Progress Notes (Signed)
Patient here today as new evaluation regarding abnormal serum  immuniofixation.  Referred by Dr. Holley Raring.

## 2017-12-07 NOTE — Progress Notes (Addendum)
[facility] day:  12/07/2017  Chief Complaint: [name] is a 84 y.o. female with an abnormal SPEP who is referred in consultation by Dr. Anthonette Legato for assessment and management.  HPI:  The patient has stage III chronic kidney disease, proteinuria, diabetes, and hypertension.  She was referred to Dr Holley Raring for chronic kidney disease and proteinuria.  Renal ultrasound revealed no hydronephrosis, increased echogenicity, and a left lower pole slightly complex renal cyst.  Labs on 09/24/2017 revealed a creatinine 1.26, calcium 9.3, albumen 3.8, and protein 7.0  Immunofixation revealed a poorly defined area of restricted protein mobility c/w lambda light chain.    Labs on 10/24/2017 revealed a hematocrit of 45.4, hemoglobin 15.6, MCV 93.7, platelets 272,000, WBC 8600.  Creatinine was 1.32.  SPEP revealed a faint protein band restricted in the gamma globulins that most likely represents circulating immune complexes.  UPEP revealed no abnormal protein bands.  ANA was positive.  Symptomatically, she feels "pretty good most of the time".  She notes of "bout of inner ear problems" on several different occasions.  She states that meclizine helps.  She denies any recurrent infections.  Weight has been stable except for a decrease of 10 pounds in the spring of 2019.  She was watching what she ate and eating small portions secondary to diabetes.  She denies any bone pain.  Her brother had esophageal cancer.   Past Medical History:  Diagnosis Date  . A-fib ([place])   . CHF (congestive heart failure) ([place])   . Diabetes mellitus without complication ([place])   . Hypertension     Past Surgical History:  Procedure Laterality Date  . CHOLECYSTECTOMY    . LOWER EXTREMITY ANGIOGRAPHY Right 08/12/2017   Procedure: LOWER EXTREMITY ANGIOGRAPHY;  Surgeon: Katha Cabal, MD;  Location: [facility];  Service: Cardiovascular;  Laterality: Right;    Family  History  Problem Relation Age of Onset  . Cancer Brother     Social History:  reports that she has never smoked. She has never used smokeless tobacco. She reports that she does not drink alcohol or use drugs.  She denies any exposure to radiation or toxins.  Her husband is [name]; they have been married for 83 years.  She lives in [place].  The patient is alone today.  Allergies:  Allergies  Allergen Reactions  . Sulfa Antibiotics Hives    Current Medications: Current Outpatient Medications  Medication Sig Dispense Refill  . ACCU-CHEK AVIVA PLUS test strip     . allopurinol (ZYLOPRIM) 100 MG tablet Take 100 mg by mouth daily.    Marland Kitchen aspirin EC 81 MG tablet Take 1 tablet (81 mg total) by mouth daily. 150 tablet 2  . Calcium Carbonate-Vitamin D 600-400 MG-UNIT tablet Take 1 tablet by mouth daily.    Marland Kitchen diltiazem (CARDIZEM CD) 120 MG 24 hr capsule Take 120 mg by mouth daily.     . furosemide (LASIX) 20 MG tablet Take 40 mg by mouth daily.     Marland Kitchen gabapentin (NEURONTIN) 100 MG capsule Take 100 mg by mouth at bedtime.    Marland Kitchen HUMALOG KWIKPEN 100 UNIT/ML KiwkPen Inject 8-10 Units into the skin 3 (three) times daily before meals. Per sliding scale    . LEVEMIR FLEXTOUCH 100 UNIT/ML Pen Inject 12 Units into the skin at bedtime.     Marland Kitchen levothyroxine (SYNTHROID, LEVOTHROID) 88 MCG tablet Take 88 mcg by mouth daily before breakfast.     .  lisinopril (PRINIVIL,ZESTRIL) 40 MG tablet Take 40 mg by mouth daily.    Marland Kitchen lovastatin (MEVACOR) 20 MG tablet Take 20 mg by mouth at bedtime.    . meclizine (ANTIVERT) 25 MG tablet Take 25 mg by mouth 3 (three) times daily as needed for dizziness.     . metFORMIN (GLUCOPHAGE) 500 MG tablet Take 500 mg by mouth 2 (two) times daily with a meal.     . potassium chloride SA (K-DUR,KLOR-CON) 20 MEQ tablet Take 20 mEq by mouth as needed.    . warfarin (COUMADIN) 2 MG tablet Take 2 mg by mouth at bedtime.      No current facility-administered medications for this visit.      Review of Systems:  GENERAL:  Feels "pretty good most of the time".  No fevers, sweats.  Weight loss of 10 pounds in the spring. PERFORMANCE STATUS (ECOG):  1 HEENT:  Macular degeneration.  No visual changes, runny nose, sore throat, mouth sores or tenderness. Lungs: Shortness of breath with exertion.  Cough secondary to allergies.  No hemoptysis. Cardiac:  Atrial fibrillation.  No chest pain, palpitations, orthopnea, or PND. GI:  Little constipation.  No nausea, vomiting, diarrhea, melena or hematochezia. GU:  Urgency.  Nofrequency, dysuria, or hematuria. Musculoskeletal:  No back pain.  No joint pain.  No muscle tenderness. Extremities:  No pain or swelling. Skin:  No rashes or skin changes. Neuro:  Headaches.  No numbness or weakness, balance or coordination issues. Endocrine:  No diabetes, thyroid issues, hot flashes or night sweats. Psych:  No mood changes, depression or anxiety. Pain:  No focal pain. Review of systems:  All other systems reviewed and found to be negative.  Physical Exam: Blood pressure (!) 166/97, pulse 85, temperature 98 F (36.7 C), temperature source Tympanic, resp. rate 18, height _0  (1.6 m), weight 126 lb 1.7 oz (57.2 kg). GENERAL:  Thin elderly woman sitting comfortably in the exam room in no acute distress. MENTAL STATUS:  Alert and oriented to person, place and time. HEAD:  Curly short hair.  Normocephalic, atraumatic, face symmetric, no Cushingoid features. EYES:  Glasses.  Blue eyes s/p cataract surgery.  Pupils equal round and reactive to light and accomodation.  No conjunctivitis or scleral icterus. ENT:  Oropharynx clear without lesion.  Tongue normal.  Dentures.  Mucous membranes moist.  RESPIRATORY:  Clear to auscultation without rales, wheezes or rhonchi. CARDIOVASCULAR:  Regular rate and rhythm without murmur, rub or gallop. ABDOMEN:  Soft, non-tender, with active bowel sounds, and no hepatosplenomegaly.  No masses. SKIN:  No rashes, ulcers  or lesions. EXTREMITIES: No edema, no skin discoloration or tenderness.  No palpable cords. LYMPH NODES: No palpable cervical, supraclavicular, axillary or inguinal adenopathy  NEUROLOGICAL: Unremarkable. PSYCH:  Appropriate.   Appointment on 12/07/2017  Component Date Value Ref Range Status  . Kappa free light chain 12/07/2017 71.9* 3.3 - 19.4 mg/L Final  . Lamda free light chains 12/07/2017 31.9* 5.7 - 26.3 mg/L Final  . Kappa, lamda light chain ratio 12/07/2017 2.25* 0.26 - 1.65 Final   Comment: (NOTE) Performed At: [facility] [address] [place], [place] [id number] Rush Farmer MD LH:[id number]   . IgG (Immunoglobin G), Serum 12/07/2017 1,370  700 - 1,600 mg/dL Final  . IgA 12/07/2017 286  64 - 422 mg/dL Final  . IgM (Immunoglobulin M), Srm 12/07/2017 125  26 - 217 mg/dL Final  . Total Protein ELP 12/07/2017 7.6  6.0 - 8.5 g/dL Corrected  . Albumin  SerPl Elph-Mcnc 12/07/2017 4.1  2.9 - 4.4 g/dL Corrected  . Alpha 1 12/07/2017 0.2  0.0 - 0.4 g/dL Corrected  . Alpha2 Glob SerPl Elph-Mcnc 12/07/2017 1.0  0.4 - 1.0 g/dL Corrected  . B-Globulin SerPl Elph-Mcnc 12/07/2017 1.2  0.7 - 1.3 g/dL Corrected  . Gamma Glob SerPl Elph-Mcnc 12/07/2017 1.2  0.4 - 1.8 g/dL Corrected  . M Protein SerPl Elph-Mcnc 12/07/2017 Not Observed  Not Observed g/dL Corrected  . Globulin, Total 12/07/2017 3.5  2.2 - 3.9 g/dL Corrected  . Albumin/Glob SerPl 12/07/2017 1.2  0.7 - 1.7 Corrected  . IFE 1 12/07/2017 Comment   Corrected   An apparent normal immunofixation pattern.  . Please Note 12/07/2017 Comment   Corrected   Comment: (NOTE) Protein electrophoresis scan will follow via computer, mail, or courier delivery. Performed At: [facility] [place], [place] [id number] Rush Farmer MD LG:[id number]   . Sodium 12/07/2017 140  135 - 145 mmol/L Final  . Potassium 12/07/2017 3.7  3.5 - 5.1 mmol/L Final  . Chloride 12/07/2017 102  98 - 111 mmol/L Final  . CO2  12/07/2017 26  22 - 32 mmol/L Final  . Glucose, Bld 12/07/2017 98  70 - 99 mg/dL Final  . BUN 12/07/2017 18  8 - 23 mg/dL Final  . Creatinine, Ser 12/07/2017 1.13* 0.44 - 1.00 mg/dL Final  . Calcium 12/07/2017 9.2  8.9 - 10.3 mg/dL Final  . Total Protein 12/07/2017 8.4* 6.5 - 8.1 g/dL Final  . Albumin 12/07/2017 4.4  3.5 - 5.0 g/dL Final  . AST 12/07/2017 26  15 - 41 U/L Final  . ALT 12/07/2017 14  0 - 44 U/L Final  . Alkaline Phosphatase 12/07/2017 83  38 - 126 U/L Final  . Total Bilirubin 12/07/2017 0.6  0.3 - 1.2 mg/dL Final  . GFR calc non Af Amer 12/07/2017 41* >60 mL/min Final  . GFR calc Af Amer 12/07/2017 48* >60 mL/min Final   Comment: (NOTE) The eGFR has been calculated using the CKD EPI equation. This calculation has not been validated in all clinical situations. eGFR's persistently <60 mL/min signify possible Chronic Kidney Disease.   Georgiann Hahn gap 12/07/2017 12  5 - 15 Final   Performed at [facility], [address], [place], [place] [address]  . WBC 12/07/2017 7.5  3.6 - 11.0 K/uL Final  . RBC 12/07/2017 4.76  3.80 - 5.20 MIL/uL Final  . Hemoglobin 12/07/2017 15.1  12.0 - 16.0 g/dL Final  . HCT 12/07/2017 45.6  35.0 - 47.0 % Final  . MCV 12/07/2017 96.0  80.0 - 100.0 fL Final  . MCH 12/07/2017 31.8  26.0 - 34.0 pg Final  . MCHC 12/07/2017 33.2  32.0 - 36.0 g/dL Final  . RDW 12/07/2017 14.9* 11.5 - 14.5 % Final  . Platelets 12/07/2017 233  150 - 440 K/uL Final  . Neutrophils Relative % 12/07/2017 58  % Final  . Neutro Abs 12/07/2017 4.4  1.4 - 6.5 K/uL Final  . Lymphocytes Relative 12/07/2017 28  % Final  . Lymphs Abs 12/07/2017 2.1  1.0 - 3.6 K/uL Final  . Monocytes Relative 12/07/2017 11  % Final  . Monocytes Absolute 12/07/2017 0.8  0.2 - 0.9 K/uL Final  . Eosinophils Relative 12/07/2017 2  % Final  . Eosinophils Absolute 12/07/2017 0.2  0 - 0.7 K/uL Final  . Basophils Relative 12/07/2017 1  % Final  . Basophils Absolute 12/07/2017 0.1  0 - 0.1 K/uL  Final   Performed at [facility], [address], [place], [place] [address]    Assessment:  Chalice Philbert Cancelliere is a 84 y.o. female with stage III chronic kidney disease and an abnormal SPEP.  SPEP on 10/24/2017 revealed a faint protein band restricted in the gamma globulins that most likely represents circulating immune complexes.  Immunofixation revealed a poorly defined area of restricted protein mobility c/w lambda light chain.  UPEP revealed no abnormal protein bands.   She denies any recurrent infections or bone pain.  CBC, calcium, albumen, and protein are normal.  Symptomatically, she feels "good most of the time".  Exam is unremarkable.  Plan: 1.  Discuss abnormal SPEP.  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  Differential includes a lymphoproliferative disorder such as multiple myeloma (doubt).  MGUS and myeloma discussed in general terms.  Discuss laboratory work-up.   2.  Labs today:  CBC with diff, CMP, myeloma panel, FLCA. 3.  24 hour UPEP with free light chains. 4.  RTC in 2 weeks for MD assessment, review of work-up and discussion regarding direction of therapy.   Lequita Asal, MD  12/07/2017, 5:35 PM

## 2017-12-08 DIAGNOSIS — R778 Other specified abnormalities of plasma proteins: Secondary | ICD-10-CM | POA: Diagnosis not present

## 2017-12-08 LAB — KAPPA/LAMBDA LIGHT CHAINS
Kappa free light chain: 71.9 mg/L — ABNORMAL HIGH (ref 3.3–19.4)
Kappa, lambda light chain ratio: 2.25 — ABNORMAL HIGH (ref 0.26–1.65)
Lambda free light chains: 31.9 mg/L — ABNORMAL HIGH (ref 5.7–26.3)

## 2017-12-09 ENCOUNTER — Inpatient Hospital Stay: Payer: Medicare Other

## 2017-12-09 DIAGNOSIS — R778 Other specified abnormalities of plasma proteins: Secondary | ICD-10-CM

## 2017-12-10 LAB — MULTIPLE MYELOMA PANEL, SERUM
Albumin SerPl Elph-Mcnc: 4.1 g/dL (ref 2.9–4.4)
Albumin/Glob SerPl: 1.2 (ref 0.7–1.7)
Alpha 1: 0.2 g/dL (ref 0.0–0.4)
Alpha2 Glob SerPl Elph-Mcnc: 1 g/dL (ref 0.4–1.0)
B-Globulin SerPl Elph-Mcnc: 1.2 g/dL (ref 0.7–1.3)
Gamma Glob SerPl Elph-Mcnc: 1.2 g/dL (ref 0.4–1.8)
Globulin, Total: 3.5 g/dL (ref 2.2–3.9)
IgA: 286 mg/dL (ref 64–422)
IgG (Immunoglobin G), Serum: 1370 mg/dL (ref 700–1600)
IgM (Immunoglobulin M), Srm: 125 mg/dL (ref 26–217)
Total Protein ELP: 7.6 g/dL (ref 6.0–8.5)

## 2017-12-19 LAB — IFE+PROTEIN ELECTRO, 24-HR UR
% BETA, Urine: 9.6 %
ALPHA 1 URINE: 2.5 %
Albumin, U: 72.8 %
Alpha 2, Urine: 6.4 %
GAMMA GLOBULIN URINE: 8.8 %
Total Protein, Urine-Ur/day: 190 mg/24 hr — ABNORMAL HIGH (ref 30–150)
Total Protein, Urine: 23.8 mg/dL
Total Volume: 800

## 2017-12-21 ENCOUNTER — Other Ambulatory Visit: Payer: Self-pay

## 2017-12-21 ENCOUNTER — Inpatient Hospital Stay: Payer: Medicare Other | Attending: Hematology and Oncology | Admitting: Hematology and Oncology

## 2017-12-21 ENCOUNTER — Encounter: Payer: Self-pay | Admitting: Hematology and Oncology

## 2017-12-21 VITALS — BP 160/74 | HR 69 | Temp 97.7°F | Resp 18 | Wt 126.5 lb

## 2017-12-21 DIAGNOSIS — Z7984 Long term (current) use of oral hypoglycemic drugs: Secondary | ICD-10-CM

## 2017-12-21 DIAGNOSIS — R778 Other specified abnormalities of plasma proteins: Secondary | ICD-10-CM

## 2017-12-21 DIAGNOSIS — E1122 Type 2 diabetes mellitus with diabetic chronic kidney disease: Secondary | ICD-10-CM

## 2017-12-21 DIAGNOSIS — N183 Chronic kidney disease, stage 3 (moderate): Secondary | ICD-10-CM

## 2017-12-21 DIAGNOSIS — I130 Hypertensive heart and chronic kidney disease with heart failure and stage 1 through stage 4 chronic kidney disease, or unspecified chronic kidney disease: Secondary | ICD-10-CM | POA: Diagnosis not present

## 2017-12-21 DIAGNOSIS — Z794 Long term (current) use of insulin: Secondary | ICD-10-CM | POA: Diagnosis not present

## 2017-12-21 DIAGNOSIS — Z79899 Other long term (current) drug therapy: Secondary | ICD-10-CM | POA: Diagnosis not present

## 2017-12-21 DIAGNOSIS — I4891 Unspecified atrial fibrillation: Secondary | ICD-10-CM | POA: Diagnosis not present

## 2017-12-21 DIAGNOSIS — Z7982 Long term (current) use of aspirin: Secondary | ICD-10-CM | POA: Diagnosis not present

## 2017-12-21 DIAGNOSIS — Z7901 Long term (current) use of anticoagulants: Secondary | ICD-10-CM | POA: Diagnosis not present

## 2017-12-21 NOTE — Progress Notes (Signed)
Here for follow up. Doing " well ...considering my age" per pt

## 2017-12-21 NOTE — Progress Notes (Signed)
[facility] day:  12/21/2017  Chief Complaint: [name] is a 84 y.o. female with an abnormal SPEP who is seen for review of work-up and discussion regarding direction of therapy.  HPI:  The patient was last seen in the hematology clinic on 12/07/2017 for initial consultation.  She was noted to have stage III chronic kidney disease and an abnormal SPEP.  Work-up on 12/07/2017 revealed a hematocrit of 45.6, hemoglobin 15.1, MCV 96.0, platelets 233,000, WBC 7500 with an ANC of 4400.  SPEP revealed no monoclonal protein.  Immunoglobulin levels were normal.  Kappa free light chains were 71.9, lambda free light chains 31.9 with a ratio of 2.25 (0.26-1.65).  Creatinine was 1.13 (CrCl 27.4 ml/min).  24 hour UPEP revealed 190 mg/24 hours (30-150) with no monoclonal protein and a normal immunofixation.  During the interim, she has done well.  She voices no new concerns.   Past Medical History:  Diagnosis Date  . A-fib ([place])   . CHF (congestive heart failure) ([place])   . Diabetes mellitus without complication ([place])   . Hypertension     Past Surgical History:  Procedure Laterality Date  . CHOLECYSTECTOMY    . LOWER EXTREMITY ANGIOGRAPHY Right 08/12/2017   Procedure: LOWER EXTREMITY ANGIOGRAPHY;  Surgeon: Katha Cabal, MD;  Location: [facility];  Service: Cardiovascular;  Laterality: Right;    Family History  Problem Relation Age of Onset  . Cancer Brother     Social History:  reports that she has never smoked. She has never used smokeless tobacco. She reports that she does not drink alcohol or use drugs.  She denies any exposure to radiation or toxins.  Her husband is [name]; they have been married for 33 years.  She lives in [place].  The patient is accompanied by her husband, [name], today.  Allergies:  Allergies  Allergen Reactions  . Sulfa Antibiotics Hives    Current Medications: Current Outpatient Medications  Medication  Sig Dispense Refill  . allopurinol (ZYLOPRIM) 100 MG tablet Take 100 mg by mouth daily.    Marland Kitchen aspirin EC 81 MG tablet Take 1 tablet (81 mg total) by mouth daily. 150 tablet 2  . Calcium Carbonate-Vitamin D 600-400 MG-UNIT tablet Take 1 tablet by mouth daily.    Marland Kitchen diltiazem (CARDIZEM CD) 120 MG 24 hr capsule Take 120 mg by mouth daily.     . furosemide (LASIX) 20 MG tablet Take 40 mg by mouth daily.     Marland Kitchen HUMALOG KWIKPEN 100 UNIT/ML KiwkPen Inject 8-10 Units into the skin 3 (three) times daily before meals. Per sliding scale    . LEVEMIR FLEXTOUCH 100 UNIT/ML Pen Inject 12 Units into the skin at bedtime.     Marland Kitchen levothyroxine (SYNTHROID, LEVOTHROID) 88 MCG tablet Take 88 mcg by mouth daily before breakfast.     . lisinopril (PRINIVIL,ZESTRIL) 40 MG tablet Take 40 mg by mouth daily.    Marland Kitchen lovastatin (MEVACOR) 20 MG tablet Take 20 mg by mouth at bedtime.    . metFORMIN (GLUCOPHAGE) 500 MG tablet Take 500 mg by mouth 2 (two) times daily with a meal.     . potassium chloride SA (K-DUR,KLOR-CON) 20 MEQ tablet Take 20 mEq by mouth as needed.    . warfarin (COUMADIN) 2 MG tablet Take 2 mg by mouth at bedtime.     Marland Kitchen ACCU-CHEK AVIVA PLUS test strip     . gabapentin (NEURONTIN) 100 MG capsule Take  100 mg by mouth at bedtime.    . meclizine (ANTIVERT) 25 MG tablet Take 25 mg by mouth 3 (three) times daily as needed for dizziness.      No current facility-administered medications for this visit.     Review of Systems:  GENERAL:  Feels good.  No fevers, sweats or weight loss. PERFORMANCE STATUS (ECOG):  1 HEENT:  Macular degeneration.  No visual changes, runny nose, sore throat, mouth sores or tenderness. Lungs: Shortness of breath with exertion.  Allergy related cough.  No hemoptysis. Cardiac:  Atrial fibrillation.  No chest pain, palpitations, orthopnea, or PND. GI:  Some constipation.  No nausea, vomiting, diarrhea, melena or hematochezia. GU:  Urgency.  No frequency, dysuria, or  hematuria. Musculoskeletal:  No back pain.  No joint pain.  No muscle tenderness. Extremities:  No pain or swelling. Skin:  No rashes or skin changes. Neuro:  No headache, numbness or weakness, balance or coordination issues. Endocrine:  No diabetes, thyroid issues, hot flashes or night sweats. Psych:  No mood changes, depression or anxiety. Pain:  No focal pain. Review of systems:  All other systems reviewed and found to be negative.   Physical Exam: Blood pressure (!) 160/74, pulse 69, temperature 97.7 F (36.5 C), temperature source Tympanic, resp. rate 18, weight 126 lb 8.7 oz (57.4 kg). GENERAL:  Thin elderly woman sitting comfortably in the exam room in no acute distress. MENTAL STATUS:  Alert and oriented to person, place and time. HEAD:  Blonde/gray hair.  Normocephalic, atraumatic, face symmetric, no Cushingoid features. EYES:  Glasses.  Blue eyes s/p cataract surgery.  No conjunctivitis or scleral icterus. NEUROLOGICAL: Unremarkable. PSYCH:  Appropriate.    No visits with results within 3 Day(s) from this visit.  Latest known visit with results is:  Appointment on 12/09/2017  Component Date Value Ref Range Status  . Total Protein, Urine 12/08/2017 23.8  Not Estab. mg/dL Final  . Total Protein, Urine-Ur/day 12/08/2017 190* 30 - 150 mg/24 hr Final  . Albumin, U 12/08/2017 72.8  % Final  . ALPHA 1 URINE 12/08/2017 2.5  % Final  . Alpha 2, Urine 12/08/2017 6.4  % Final  . % BETA, Urine 12/08/2017 9.6  % Final  . GAMMA GLOBULIN URINE 12/08/2017 8.8  % Final  . M-SPIKE %, Urine 12/08/2017 Not Observed  Not Observed % Final  . Immunofixation Result, Urine 12/08/2017 Comment   Final   An apparent normal immunofixation pattern.  . Note: 12/08/2017 Comment   Final   Comment: (NOTE) Protein electrophoresis scan will follow via computer, mail, or courier delivery. Performed At: [facility] [place], [place] [id number] Rush Farmer MD [id number]   .  Total Volume 12/08/2017 800   Final   Performed at [facility], [address] [place], [place] [address]    Assessment:  [name] is a 84 y.o. female with stage III chronic kidney disease and an abnormal SPEP.  SPEP on 10/24/2017 revealed a faint protein band restricted in the gamma globulins that most likely represents circulating immune complexes.  Immunofixation revealed a poorly defined area of restricted protein mobility c/w lambda light chain.  UPEP revealed no abnormal protein bands.   Work-up on 12/07/2017 revealed a hematocrit of 45.6, hemoglobin 15.1, MCV 96.0, platelets 233,000, WBC 7500 with an ANC of 4400.  SPEP revealed no monoclonal protein.  Immunoglobulin levels were normal.  Kappa free light chains were 71.9, lambda free light chains 31.9 with a  ratio of 2.25 (0.26-1.65).  Creatinine was 1.13 (CrCl 27.4 ml/min).  24 hour UPEP revealed 190 mg/24 hours (30-150) with no monoclonal protein and a normal immunofixation.  She denies any recurrent infections or bone pain.  CBC, calcium, albumen, and protein are normal.  Symptomatically, she denies any concerns.  Plan: 1.  Review work-up.  No evidence of a monoclonal gammopathy. 2.  RTC prn.   Lequita Asal, MD  12/21/2017, 3:44 PM

## 2018-01-19 ENCOUNTER — Ambulatory Visit (INDEPENDENT_AMBULATORY_CARE_PROVIDER_SITE_OTHER): Payer: Medicare Other | Admitting: Vascular Surgery

## 2018-01-19 ENCOUNTER — Encounter (INDEPENDENT_AMBULATORY_CARE_PROVIDER_SITE_OTHER): Payer: Medicare Other

## 2018-02-02 ENCOUNTER — Encounter: Payer: Self-pay | Admitting: Nephrology

## 2018-02-27 ENCOUNTER — Ambulatory Visit (INDEPENDENT_AMBULATORY_CARE_PROVIDER_SITE_OTHER): Payer: Medicare Other | Admitting: Vascular Surgery

## 2018-02-27 ENCOUNTER — Ambulatory Visit (INDEPENDENT_AMBULATORY_CARE_PROVIDER_SITE_OTHER): Payer: Medicare Other

## 2018-02-27 ENCOUNTER — Encounter (INDEPENDENT_AMBULATORY_CARE_PROVIDER_SITE_OTHER): Payer: Self-pay | Admitting: Vascular Surgery

## 2018-02-27 VITALS — BP 161/88 | HR 66 | Resp 16 | Ht 63.0 in | Wt 127.6 lb

## 2018-02-27 DIAGNOSIS — I10 Essential (primary) hypertension: Secondary | ICD-10-CM

## 2018-02-27 DIAGNOSIS — E782 Mixed hyperlipidemia: Secondary | ICD-10-CM | POA: Diagnosis not present

## 2018-02-27 DIAGNOSIS — I739 Peripheral vascular disease, unspecified: Secondary | ICD-10-CM

## 2018-02-27 DIAGNOSIS — Z794 Long term (current) use of insulin: Secondary | ICD-10-CM

## 2018-02-27 DIAGNOSIS — E1151 Type 2 diabetes mellitus with diabetic peripheral angiopathy without gangrene: Secondary | ICD-10-CM

## 2018-02-27 NOTE — Progress Notes (Signed)
MRN : [id number]  [name] is a 84 y.o. (1925/04/22) female who presents with chief complaint of  Chief Complaint  Patient presents with  . Follow-up    68month ultrasound follow up  .  History of Present Illness: The patient returns to the office for followup and review of the noninvasive studies. There have been no interval changes in lower extremity symptoms. No interval shortening of the patient's claudication distance or development of rest pain symptoms. No new ulcers or wounds have occurred since the last visit.  There have been no significant changes to the patient's overall health care.  The patient denies amaurosis fugax or recent TIA symptoms. There are no recent neurological changes noted. The patient denies history of DVT, PE or superficial thrombophlebitis. The patient denies recent episodes of angina or shortness of breath.   ABI Rt=0.89 and Lt=1.13  (previous ABI's Rt=1.00 and Lt=0.72) Duplex ultrasound of the bilateral lower extremities shows a >70% stenosis of the right SFA at the leading edge of the stent with monophasic signals distally, left leg does not show a focal stenoiss and there are bi to triphasic signals distally  Current Meds  Medication Sig  . ACCU-CHEK AVIVA PLUS test strip   . allopurinol (ZYLOPRIM) 100 MG tablet Take 100 mg by mouth daily.  Marland Kitchen aspirin EC 81 MG tablet Take 1 tablet (81 mg total) by mouth daily.  . Calcium Carbonate-Vitamin D 600-400 MG-UNIT tablet Take 1 tablet by mouth daily.  Marland Kitchen diltiazem (CARDIZEM CD) 120 MG 24 hr capsule Take 120 mg by mouth daily.   . furosemide (LASIX) 20 MG tablet Take 40 mg by mouth daily.   Marland Kitchen gabapentin (NEURONTIN) 100 MG capsule Take 100 mg by mouth at bedtime.  Marland Kitchen HUMALOG KWIKPEN 100 UNIT/ML KiwkPen Inject 8-10 Units into the skin 3 (three) times daily before meals. Per sliding scale  . LEVEMIR FLEXTOUCH 100 UNIT/ML Pen Inject 12 Units into the skin at bedtime.   Marland Kitchen levothyroxine (SYNTHROID, LEVOTHROID)  88 MCG tablet Take 88 mcg by mouth daily before breakfast.   . lisinopril (PRINIVIL,ZESTRIL) 40 MG tablet Take 40 mg by mouth daily.  Marland Kitchen lovastatin (MEVACOR) 20 MG tablet Take 20 mg by mouth at bedtime.  . meclizine (ANTIVERT) 25 MG tablet Take 25 mg by mouth 3 (three) times daily as needed for dizziness.   . metFORMIN (GLUCOPHAGE) 500 MG tablet Take 500 mg by mouth 2 (two) times daily with a meal.   . potassium chloride SA (K-DUR,KLOR-CON) 20 MEQ tablet Take 20 mEq by mouth as needed.  . warfarin (COUMADIN) 2 MG tablet Take 2 mg by mouth at bedtime.     Past Medical History:  Diagnosis Date  . A-fib ([place])   . CHF (congestive heart failure) ([place])   . Diabetes mellitus without complication ([place])   . Hypertension     Past Surgical History:  Procedure Laterality Date  . CHOLECYSTECTOMY    . LOWER EXTREMITY ANGIOGRAPHY Right 08/12/2017   Procedure: LOWER EXTREMITY ANGIOGRAPHY;  Surgeon: Katha Cabal, MD;  Location: [facility];  Service: Cardiovascular;  Laterality: Right;    Social History Social History   Tobacco Use  . Smoking status: Never Smoker  . Smokeless tobacco: Never Used  Substance Use Topics  . Alcohol use: No    Frequency: Never  . Drug use: No    Family History Family History  Problem Relation Age of Onset  . Cancer Brother     Allergies  Allergen Reactions  . Sulfa Antibiotics Hives     REVIEW OF SYSTEMS (Negative unless checked)  Constitutional: [] Weight loss  [] Fever  [] Chills Cardiac: [] Chest pain   [] Chest pressure   [] Palpitations   [] Shortness of breath when laying flat   [] Shortness of breath with exertion. Vascular:  [x] Pain in legs with walking   [] Pain in legs at rest  [] History of DVT   [] Phlebitis   [] Swelling in legs   [] Varicose veins   [] Non-healing ulcers Pulmonary:   [] Uses home oxygen   [] Productive cough   [] Hemoptysis   [] Wheeze  [] COPD   [] Asthma Neurologic:  [] Dizziness   [] Seizures   [] History of stroke   [] History  of TIA  [] Aphasia   [] Vissual changes   [] Weakness or numbness in arm   [x] Weakness or numbness in leg Musculoskeletal:   [] Joint swelling   [] Joint pain   [] Low back pain Hematologic:  [] Easy bruising  [] Easy bleeding   [] Hypercoagulable state   [] Anemic Gastrointestinal:  [] Diarrhea   [] Vomiting  [] Gastroesophageal reflux/heartburn   [] Difficulty swallowing. Genitourinary:  [] Chronic kidney disease   [] Difficult urination  [] Frequent urination   [] Blood in urine Skin:  [] Rashes   [] Ulcers  Psychological:  [] History of anxiety   []  History of major depression.  Physical Examination  Vitals:   02/27/18 1511  BP: (!) 161/88  Pulse: 66  Resp: 16  Weight: 127 lb 9.6 oz (57.9 kg)  Height: 5\' 3"  (1.6 m)   Body mass index is 22.6 kg/m. Gen: WD/WN, NAD Head: [place]/AT, No temporalis wasting.  Ear/Nose/Throat: Hearing grossly intact, nares w/o erythema or drainage Eyes: PER, EOMI, sclera nonicteric.  Neck: Supple, no large masses.   Pulmonary:  Good air movement, no audible wheezing bilaterally, no use of accessory muscles.  Cardiac: RRR, no JVD Vascular:  Vessel Right Left  Radial Palpable Palpable  PT Not Palpable Trace Palpable  DP Not Palpable Not Palpable  Gastrointestinal: Non-distended. No guarding/no peritoneal signs.  Musculoskeletal: M/S 5/5 throughout.  No deformity or atrophy.  Neurologic: CN 2-12 intact. Symmetrical.  Speech is fluent. Motor exam as listed above. Psychiatric: Judgment intact, Mood & affect appropriate for pt's clinical situation. Dermatologic: No rashes or ulcers noted.  No changes consistent with cellulitis. Lymph : No lichenification or skin changes of chronic lymphedema.  CBC Lab Results  Component Value Date   WBC 7.5 12/07/2017   HGB 15.1 12/07/2017   HCT 45.6 12/07/2017   MCV 96.0 12/07/2017   PLT 233 12/07/2017    BMET    Component Value Date/Time   NA 140 12/07/2017 1212   NA 141 08/03/2011 0729   K 3.7 12/07/2017 1212   K 3.6  08/03/2011 0729   CL 102 12/07/2017 1212   CL 105 08/03/2011 0729   CO2 26 12/07/2017 1212   CO2 26 08/03/2011 0729   GLUCOSE 98 12/07/2017 1212   GLUCOSE 149 (H) 08/03/2011 0729   BUN 18 12/07/2017 1212   BUN 16 08/03/2011 0729   CREATININE 1.13 (H) 12/07/2017 1212   CREATININE 1.07 08/03/2011 0729   CALCIUM 9.2 12/07/2017 1212   CALCIUM 9.5 08/03/2011 0729   GFRNONAA 41 (L) 12/07/2017 1212   GFRNONAA 48 (L) 08/03/2011 0729   GFRAA 48 (L) 12/07/2017 1212   GFRAA 56 (L) 08/03/2011 0729   CrCl cannot be calculated (Patient's most recent lab result is older than the maximum 21 days allowed.).  COAG Lab Results  Component Value Date   INR 1.13 08/12/2017  INR 2.24 05/30/2017    Radiology Vas [place] Burnard Bunting With/wo Tbi  Result Date: 02/27/2018 LOWER EXTREMITY DOPPLER STUDY Indications: Peripheral artery disease. High Risk Factors: Hypertension.  Vascular Interventions: 08/12/2017 PTA of the right SFA, popliteal and anterior                         tibial arteries with stent placement to the right SFA                         and popliteal arteries. Comparison Study: 08/22/2017 Performing Technologist: Concha Norway RVT  Examination Guidelines: A complete evaluation includes at minimum, Doppler waveform signals and systolic blood pressure reading at the level of bilateral brachial, anterior tibial, and posterior tibial arteries, when vessel segments are accessible. Bilateral testing is considered an integral part of a complete examination. Photoelectric Plethysmograph (PPG) waveforms and toe systolic pressure readings are included as required and additional duplex testing as needed. Limited examinations for reoccurring indications may be performed as noted.  ABI Findings: +---------+------------------+-----+----------+--------+ Right    Rt Pressure (mmHg)IndexWaveform  Comment  +---------+------------------+-----+----------+--------+ Brachial 157                                        +---------+------------------+-----+----------+--------+ ATA      103               0.66 monophasic         +---------+------------------+-----+----------+--------+ PTA      140               0.89 monophasic         +---------+------------------+-----+----------+--------+ Great Toe57                0.36 Abnormal           +---------+------------------+-----+----------+--------+ +---------+------------------+-----+----------+-------+ Left     Lt Pressure (mmHg)IndexWaveform  Comment +---------+------------------+-----+----------+-------+ Brachial 157                                      +---------+------------------+-----+----------+-------+ ATA      177               1.13 monophasic        +---------+------------------+-----+----------+-------+ PTA      170               1.08 biphasic          +---------+------------------+-----+----------+-------+ Great Toe79                0.50 Normal            +---------+------------------+-----+----------+-------+ +-------+-----------+-----------+------------+------------+ ABI/TBIToday's ABIToday's TBIPrevious ABIPrevious TBI +-------+-----------+-----------+------------+------------+ Right  .89        .36        1.00        .51          +-------+-----------+-----------+------------+------------+ Left   1.13       .50        .72         .50          +-------+-----------+-----------+------------+------------+ Right ABIs and TBIs appear decreased compared to prior study on 08/22/2017. Left ABIs appear increased compared to prior study on 08/22/2017.  Summary: Right: Resting right ankle-brachial index indicates mild right lower extremity arterial disease. The right  toe-brachial index is abnormal. PPG tracings appear dampened. Left: Resting left ankle-brachial index is within normal range. No evidence of significant left lower extremity arterial disease. The left toe-brachial index is normal.  *See table(s) above for  measurements and observations.  Electronically signed by [name] MD on 02/27/2018 at 4:50:01 PM.    Final    Vas [place] Lower Extremity Arterial Duplex  Result Date: 02/27/2018 LOWER EXTREMITY ARTERIAL DUPLEX STUDY  Vascular Interventions: H/O left popliteal artery stent; Rt PTA and SFA to Pop                         stent5/06/2017. Current ABI:            rt = .89 lt = 1.13 Comparison Study: 07/20/2017 pre Rt PTA/stent Performing Technologist: Concha Norway RVT  Examination Guidelines: A complete evaluation includes B-mode imaging, spectral Doppler, color Doppler, and power Doppler as needed of all accessible portions of each vessel. Bilateral testing is considered an integral part of a complete examination. Limited examinations for reoccurring indications may be performed as noted.  Right Duplex Findings: +----------+-------+-----+--------+----------+---------------------------------+           PSV    RatioStenosisWaveform  Comments                                    cm/s                                                            +----------+-------+-----+--------+----------+---------------------------------+ CFA Mid   73                  triphasic                                   +----------+-------+-----+--------+----------+---------------------------------+ DFA       56                  triphasic                                   +----------+-------+-----+--------+----------+---------------------------------+ SFA Prox  74                  monophasic                                  +----------+-------+-----+--------+----------+---------------------------------+ SFA Mid   460                 monophasicstent origin, stenotic proximal                                           stent                             +----------+-------+-----+--------+----------+---------------------------------+ SFA Distal36                  monophasicstent                              +----------+-------+-----+--------+----------+---------------------------------+  POP Distal22                  monophasic                                  +----------+-------+-----+--------+----------+---------------------------------+ ATA Prox  35                  monophasic                                  +----------+-------+-----+--------+----------+---------------------------------+ ATA Distal20                  monophasic                                  +----------+-------+-----+--------+----------+---------------------------------+ PTA Prox  53                  monophasic                                  +----------+-------+-----+--------+----------+---------------------------------+ PTA Distal21                  monophasic                                  +----------+-------+-----+--------+----------+---------------------------------+  Left Duplex Findings: +----------+--------+-----+--------+----------+--------+           PSV cm/sRatioStenosisWaveform  Comments +----------+--------+-----+--------+----------+--------+ CFA Mid   79                   biphasic           +----------+--------+-----+--------+----------+--------+ DFA       78                   biphasic           +----------+--------+-----+--------+----------+--------+ SFA Prox  87                   biphasic           +----------+--------+-----+--------+----------+--------+ SFA Mid   73                   biphasic           +----------+--------+-----+--------+----------+--------+ SFA Distal147                  biphasic  stenotic +----------+--------+-----+--------+----------+--------+ POP Distal130                  biphasic  stent    +----------+--------+-----+--------+----------+--------+ ATA Prox  58                   biphasic           +----------+--------+-----+--------+----------+--------+ ATA Distal20                   monophasic          +----------+--------+-----+--------+----------+--------+ PTA Prox  114                  biphasic           +----------+--------+-----+--------+----------+--------+ PTA Distal15  monophasic         +----------+--------+-----+--------+----------+--------+  Summary: Right: PPG tracings appear dampened. Patent right SFA to Popliteal stent with severe stenosis in the mid thigh area with velocities increasing from 74cm/s to 460 cm/sec and a ratio of 6.2 consistent with 50-99% stent stenosis. Monophasic flow distal to stenosis. Left: Distal SFA shows a moderate stenosis in the native vessel with velocities increasing from 73cm/s to 147cm/s and a ratio of 2.01 consistent with 40-59%. Patent distal popliteal TP trunk stent with no evidence of stenosis.  See table(s) above for measurements and observations. Electronically signed by [name] MD on 02/27/2018 at 4:49:57 PM.    Final      Assessment/Plan 1. PAD (peripheral artery disease) (HCC) Recommend:  The patient has not experienced increased symptoms but given the focal stenosis associated with the stents and the monophasic signals puts her right leg at high risk for failure and loss of her stents and return of her rest pain.   Given the above findings associated with the patient's lower extremity symptoms the patient should undergo right leg angiography and intervention.  Risk and benefits were reviewed the patient.  Indications for the procedure were reviewed.  All questions were answered, the patient does not agree to proceed she has requested that she call back to the office after she has thought about it.   The patient should continue walking and begin a more formal exercise program.  The patient should continue antiplatelet therapy and aggressive treatment of the lipid abnormalities  The patient will follow up with me after the right leg angiogram.   2. Mixed hyperlipidemia Continue statin as ordered and  reviewed, no changes at this time   3. Type 2 diabetes mellitus with diabetic peripheral angiopathy without gangrene, with long-term current use of insulin (HCC) Continue hypoglycemic medications as already ordered, these medications have been reviewed and there are no changes at this time.  Hgb A1C to be monitored as already arranged by primary service   4. Essential hypertension Continue antihypertensive medications as already ordered, these medications have been reviewed and there are no changes at this time.    [name], MD  02/27/2018 8:18 PM

## 2018-03-08 ENCOUNTER — Encounter (INDEPENDENT_AMBULATORY_CARE_PROVIDER_SITE_OTHER): Payer: Self-pay

## 2018-03-08 DIAGNOSIS — S065X9A Traumatic subdural hemorrhage with loss of consciousness of unspecified duration, initial encounter: Secondary | ICD-10-CM | POA: Insufficient documentation

## 2018-03-08 DIAGNOSIS — S065XAA Traumatic subdural hemorrhage with loss of consciousness status unknown, initial encounter: Secondary | ICD-10-CM | POA: Insufficient documentation

## 2018-03-16 ENCOUNTER — Telehealth (INDEPENDENT_AMBULATORY_CARE_PROVIDER_SITE_OTHER): Payer: Self-pay

## 2018-03-16 NOTE — Telephone Encounter (Signed)
Patients husband called wanting to cancel the patients procedure that was scheduled for 12/17 due to the patient being in the hospital. The patient had a fall and had to have surgery on her head and will be going to a rehab center.

## 2018-03-27 DIAGNOSIS — M109 Gout, unspecified: Secondary | ICD-10-CM | POA: Insufficient documentation

## 2018-03-27 DIAGNOSIS — E039 Hypothyroidism, unspecified: Secondary | ICD-10-CM | POA: Insufficient documentation

## 2018-03-28 ENCOUNTER — Other Ambulatory Visit (INDEPENDENT_AMBULATORY_CARE_PROVIDER_SITE_OTHER): Payer: Self-pay | Admitting: Nurse Practitioner

## 2018-03-28 ENCOUNTER — Encounter: Admission: RE | Payer: Self-pay | Source: Home / Self Care

## 2018-03-28 ENCOUNTER — Ambulatory Visit: Admission: RE | Admit: 2018-03-28 | Payer: Medicare Other | Source: Home / Self Care | Admitting: Vascular Surgery

## 2018-03-28 SURGERY — LOWER EXTREMITY ANGIOGRAPHY
Anesthesia: Moderate Sedation | Laterality: Right

## 2018-05-03 ENCOUNTER — Other Ambulatory Visit: Payer: Self-pay | Admitting: Nephrology

## 2018-05-03 DIAGNOSIS — N281 Cyst of kidney, acquired: Secondary | ICD-10-CM

## 2018-05-10 ENCOUNTER — Ambulatory Visit
Admission: RE | Admit: 2018-05-10 | Discharge: 2018-05-10 | Disposition: A | Discharge status: Home / Self Care | Payer: Medicare Other | Source: Ambulatory Visit | Attending: Nephrology | Admitting: Nephrology

## 2018-05-10 DIAGNOSIS — N281 Cyst of kidney, acquired: Secondary | ICD-10-CM | POA: Diagnosis present

## 2018-05-20 DIAGNOSIS — S22079A Unspecified fracture of T9-T10 vertebra, initial encounter for closed fracture: Secondary | ICD-10-CM | POA: Insufficient documentation

## 2018-05-20 DIAGNOSIS — M8008XA Age-related osteoporosis with current pathological fracture, vertebra(e), initial encounter for fracture: Secondary | ICD-10-CM | POA: Insufficient documentation

## 2018-06-02 ENCOUNTER — Ambulatory Visit
Admission: RE | Admit: 2018-06-02 | Discharge: 2018-06-02 | Disposition: A | Discharge status: Home / Self Care | Payer: Medicare Other | Source: Ambulatory Visit | Attending: Physician Assistant | Admitting: Physician Assistant

## 2018-06-02 ENCOUNTER — Encounter: Payer: Medicare Other | Attending: Physician Assistant | Admitting: Physician Assistant

## 2018-06-02 ENCOUNTER — Other Ambulatory Visit: Payer: Self-pay | Admitting: Physician Assistant

## 2018-06-02 DIAGNOSIS — L089 Local infection of the skin and subcutaneous tissue, unspecified: Secondary | ICD-10-CM

## 2018-06-02 DIAGNOSIS — N183 Chronic kidney disease, stage 3 (moderate): Secondary | ICD-10-CM | POA: Insufficient documentation

## 2018-06-02 DIAGNOSIS — L97818 Non-pressure chronic ulcer of other part of right lower leg with other specified severity: Secondary | ICD-10-CM | POA: Insufficient documentation

## 2018-06-02 DIAGNOSIS — I132 Hypertensive heart and chronic kidney disease with heart failure and with stage 5 chronic kidney disease, or end stage renal disease: Secondary | ICD-10-CM | POA: Insufficient documentation

## 2018-06-02 DIAGNOSIS — I509 Heart failure, unspecified: Secondary | ICD-10-CM | POA: Diagnosis not present

## 2018-06-02 DIAGNOSIS — E1122 Type 2 diabetes mellitus with diabetic chronic kidney disease: Secondary | ICD-10-CM | POA: Insufficient documentation

## 2018-06-02 DIAGNOSIS — I7389 Other specified peripheral vascular diseases: Secondary | ICD-10-CM | POA: Insufficient documentation

## 2018-06-02 DIAGNOSIS — I482 Chronic atrial fibrillation, unspecified: Secondary | ICD-10-CM | POA: Diagnosis not present

## 2018-06-04 NOTE — Progress Notes (Signed)
Kathleen, Carlson ([id number]) Visit Report for 06/02/2018 Abuse/Suicide Risk Screen Details Patient Name: ELYSIA, Carlson Date of Service: 06/02/2018 1:00 PM Medical Record Number: [id number] Patient Account Number: [id number] Date of Birth/Sex: 06-09-1925 (85 y.o. F) Treating RN: [name] Primary Care Saed Hudlow: Thereasa Distance Other Clinician: Referring Stepanie Graver: Thereasa Distance Treating Marria Mathison/Extender: Melburn Hake, HOYT Weeks in Treatment: 0 Abuse/Suicide Risk Screen Items Answer ABUSE/SUICIDE RISK SCREEN: Has anyone close to you tried to hurt or harm you recentlyo No Do you feel uncomfortable with anyone in your familyo No Has anyone forced you do things that you didnot want to doo No Do you have any thoughts of harming yourselfo No Patient displays signs or symptoms of abuse and/or neglect. No Electronic Signature(s) Signed: 06/02/2018 4:37:43 PM By: [name] Entered By: [name] on 06/02/2018 13:11:46 Carlson, Kathleen Long ([id number]) -------------------------------------------------------------------------------- Activities of Daily Living Details Patient Name: Kathleen, Carlson Date of Service: 06/02/2018 1:00 PM Medical Record Number: [id number] Patient Account Number: [id number] Date of Birth/Sex: [birth year] (85 y.o. F) Treating RN: [name] Primary Care Shoua Ressler: Thereasa Distance Other Clinician: Referring Jammy Plotkin: Thereasa Distance Treating Natalia Wittmeyer/Extender: Melburn Hake, HOYT Weeks in Treatment: 0 Activities of Daily Living Items Answer Activities of Daily Living (Please select one for each item) Drive Automobile Not Able Take Medications Completely Able Use Telephone Completely Able Care for Appearance Need Assistance Use Toilet Need Assistance Bath / Shower Need Assistance Dress Self Need Assistance Feed Self Completely Able Walk Need Assistance Get In / Out Bed Need Assistance Housework Need Assistance Prepare Meals Need Assistance Handle Money Need  Assistance Shop for Self Need Assistance Electronic Signature(s) Signed: 06/02/2018 4:37:43 PM By: [name] Entered By: [name] on 06/02/2018 13:12:15 Carlson, Kathleen Long ([id number]) -------------------------------------------------------------------------------- Education Assessment Details Patient Name: [name] Date of Service: 06/02/2018 1:00 PM Medical Record Number: [id number] Patient Account Number: [id number] Date of Birth/Sex: May 28, 1925 (85 y.o. F) Treating RN: [name] Primary Care Lan Mcneill: Thereasa Distance Other Clinician: Referring Derra Shartzer: Thereasa Distance Treating Omkar Stratmann/Extender: Melburn Hake, HOYT Weeks in Treatment: 0 Primary Learner Assessed: Patient Learning Preferences/Education Level/Primary Language Learning Preference: Explanation, Demonstration Highest Education Level: High School Preferred Language: English Cognitive Barrier Assessment/Beliefs Language Barrier: No Translator Needed: No Memory Deficit: No Emotional Barrier: No Cultural/Religious Beliefs Affecting Medical Care: No Physical Barrier Assessment Impaired Vision: No Impaired Hearing: No Decreased Hand dexterity: No Knowledge/Comprehension Assessment Knowledge Level: Medium Comprehension Level: Medium Ability to understand written Medium instructions: Ability to understand verbal Medium instructions: Motivation Assessment Anxiety Level: Calm Cooperation: Cooperative Education Importance: Acknowledges Need Interest in Health Problems: Asks Questions Perception: Coherent Willingness to Engage in Self- Medium Management Activities: Readiness to Engage in Self- Medium Management Activities: Electronic Signature(s) Signed: 06/02/2018 4:37:43 PM By: [name] Entered By: [name] on 06/02/2018 13:12:37 Carlson, Kathleen Long ([id number]) -------------------------------------------------------------------------------- Fall Risk Assessment Details Patient Name:  [name] Date of Service: 06/02/2018 1:00 PM Medical Record Number: [id number] Patient Account Number: [id number] Date of Birth/Sex: 1926-03-05 (85 y.o. F) Treating RN: [name] Primary Care Dequavion Follette: Thereasa Distance Other Clinician: Referring Kurtis Anastasia: Thereasa Distance Treating Luisalberto Beegle/Extender: Melburn Hake, HOYT Weeks in Treatment: 0 Fall Risk Assessment Items Have you had 2 or more falls in the last 12 monthso 0 Yes Have you had any fall that resulted in injury in the last 12 monthso 0 Yes FALL RISK ASSESSMENT: History of falling - immediate or within 3 months 25 Yes Secondary diagnosis 0 No Ambulatory aid None/bed rest/wheelchair/nurse 0 No Crutches/cane/walker 15 Yes Furniture 0  No IV Access/Saline Lock 0 No Gait/Training Normal/bed rest/immobile 0 No Weak 10 Yes Impaired 20 Yes Mental Status Oriented to own ability 0 Yes Electronic Signature(s) Signed: 06/02/2018 4:37:43 PM By: [name] Entered By: [name] on 06/02/2018 13:12:46 Carlson, Kathleen Long ([id number]) -------------------------------------------------------------------------------- Foot Assessment Details Patient Name: [name] Date of Service: 06/02/2018 1:00 PM Medical Record Number: [id number] Patient Account Number: [id number] Date of Birth/Sex: 1926-03-04 (85 y.o. F) Treating RN: [name] Primary Care Early Steel: Thereasa Distance Other Clinician: Referring Davina Howlett: Thereasa Distance Treating Buryl Bamber/Extender: Melburn Hake, HOYT Weeks in Treatment: 0 Foot Assessment Items Site Locations + = Sensation present, - = Sensation absent, C = Callus, U = Ulcer R = Redness, W = Warmth, M = Maceration, PU = Pre-ulcerative lesion F = Fissure, S = Swelling, D = Dryness Assessment Right: Left: Other Deformity: No No Prior Foot Ulcer: No No Prior Amputation: No No Charcot Joint: No No Ambulatory Status: Ambulatory With Help Assistance Device: Walker Gait: Actor) Signed: 06/02/2018 4:37:43 PM By: [name] Entered By: [name] on 06/02/2018 13:21:03 Carlson, Kathleen Long ([id number]) -------------------------------------------------------------------------------- Nutrition Risk Assessment Details Patient Name: [name] Date of Service: 06/02/2018 1:00 PM Medical Record Number: [id number] Patient Account Number: [id number] Date of Birth/Sex: 01/04/1926 (85 y.o. F) Treating RN: [name] Primary Care Asya Derryberry: Thereasa Distance Other Clinician: Referring Mayra Brahm: Thereasa Distance Treating Chade Pitner/Extender: Melburn Hake, HOYT Weeks in Treatment: 0 Height (in): 63 Weight (lbs): 102 Body Mass Index (BMI): 18.1 Nutrition Risk Assessment Items NUTRITION RISK SCREEN: I have an illness or condition that made me change the kind and/or amount of 2 Yes food I eat I eat fewer than two meals per day 0 No I eat few fruits and vegetables, or milk products 0 No I have three or more drinks of beer, liquor or wine almost every day 0 No I have tooth or mouth problems that make it hard for me to eat 0 No I don't always have enough money to buy the food I need 0 No I eat alone most of the time 0 No I take three or more different prescribed or over-the-counter drugs a day 1 Yes Without wanting to, I have lost or gained 10 pounds in the last six months 0 No I am not always physically able to shop, cook and/or feed myself 2 Yes Nutrition Protocols Good Risk Protocol Provide education on Moderate Risk Protocol 0 nutrition Electronic Signature(s) Signed: 06/02/2018 4:37:43 PM By: [name] Entered By: [name] on 06/02/2018 13:13:12

## 2018-06-04 NOTE — Progress Notes (Signed)
RAYCHEL, DOWLER ([id number]) Visit Report for 06/02/2018 Chief Complaint Document Details Patient Name: [name], [name] Date of Service: 06/02/2018 1:00 PM Medical Record Number: [id number] Patient Account Number: [id number] Date of Birth/Sex: Sep 23, 1925 (85 y.o. F) Treating RN: Harold Barban Primary Care Provider: Thereasa Distance Other Clinician: Referring Provider: Thereasa Distance Treating Provider/Extender: Melburn Hake, Lukasz Rogus Weeks in Treatment: 0 Information Obtained from: Patient Chief Complaint Right Lower leg Achilles ulcer Electronic Signature(s) Signed: 06/02/2018 2:25:44 PM By: Worthy Keeler PA-C Entered By: Worthy Keeler on 06/02/2018 13:30:40 [place], [name] -------------------------------------------------------------------------------- HPI Details Patient Name: [name] Date of Service: 06/02/2018 1:00 PM Medical Record Number: [id number] Patient Account Number: [id number] Date of Birth/Sex: [birth year] (85 y.o. F) Treating RN: Harold Barban Primary Care Provider: Thereasa Distance Other Clinician: Referring Provider: Thereasa Distance Treating Provider/Extender: Melburn Hake, Finch Costanzo Weeks in Treatment: 0 History of Present Illness HPI Description: 06/02/18 patient presents today for initial evaluation our clinic concerning the ulcers that she has on the right Achilles location. 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 She was subsequently supposed to have had a scheduled likely stent placement in December 2019 unfortunately she had a fall with a sub dural hematoma which subsequently had to be evacuated partially at least. For that reason she has never gotten back in with Dr. Delana Meyer to have the vascular procedure. She does not have any appointment scheduled with him at this time. The patient does have a history of chronic kidney disease stage III, congestive heart failure, trophy relation, and at  this point home health who has been coming out has been using [facility]. This is been since she's been home towards the end of December as best we can tell. She does have a hemoglobin A1c of 7.0 which was obtained on 11/18/17. Currently the patient's right lower extremity that showed diminished blood flow according to her arterial study which is dated 02/27/18. At that point her right ABI was 0.89 with a TBI of 0.36 and her left ABI was 1.13 with a TBI of 0.50. Her prior study which was in May on the 13th 2019 showed that she had a right ABI of one and a right TBI of 0.51 with a left ABI of 0.72 and a TBI of 0.50. Obviously since May till now her right leg has diminished as far as the blood flows concerned patient's son who was with her today states that Dr. 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 I do believe that for limb salvage this patient needs to have the above stated procedure with Dr. Delana Meyer as soon as possible. Electronic Signature(s) Signed: 06/02/2018 2:25:44 PM By: Worthy Keeler PA-C Entered By: Worthy Keeler on 06/02/2018 14:20:15 [place], [name] ([id number]) -------------------------------------------------------------------------------- Physical Exam Details Patient Name: [name] Date of Service: 06/02/2018 1:00 PM Medical Record Number: [id number] Patient Account Number: [id number] Date of Birth/Sex: [birth year] (85 y.o. F) Treating RN: Harold Barban Primary Care Provider: Thereasa Distance Other Clinician: Referring Provider: Thereasa Distance Treating Provider/Extender: Melburn Hake, Alben Jepsen Weeks in Treatment: 0 Constitutional sitting or standing blood pressure is within target range for patient.. pulse regular and within target range for patient.Marland Kitchen respirations regular, non-labored and within target range for patient.Marland Kitchen temperature within target range for patient.. Well- nourished and well-hydrated in no acute distress. Eyes conjunctiva  clear no eyelid edema noted. pupils equal round and reactive to light and accommodation. Ears, Nose,  Mouth, and Throat no gross abnormality of ear auricles or external auditory canals. normal hearing noted during conversation. mucus membranes moist. Respiratory normal breathing without difficulty. clear to auscultation bilaterally. Cardiovascular regular rate and rhythm with normal S1, S2. Absent posterior tibial and dorsalis pedis pulses bilateral lower extremities. trace pitting edema of the bilateral lower extremities. Gastrointestinal (GI) soft, non-tender, non-distended, +BS. no ventral hernia noted. Musculoskeletal Patient unable to walk without assistance. no significant deformity or arthritic changes, no loss or range of motion, no clubbing. Psychiatric this patient is able to make decisions and demonstrates good insight into disease process. Alert and Oriented x 3. pleasant and cooperative. Notes Patient's wound bed currently actually shows to be necrotic tissue overlying the surface of the wound in the posterior Achilles region on the right. Subsequently this appears to be necrotic tendon as well no debridement was performed today due to the patient's vascular status I think that this would be more detrimental than good. With that being said I do think the necrotic tissue needs to be loose enough I believe Santyl would be appropriate in this regard. Electronic Signature(s) Signed: 06/02/2018 2:25:44 PM By: Worthy Keeler PA-C Entered By: Worthy Keeler on 06/02/2018 14:21:12 [place], [name] ([id number]) -------------------------------------------------------------------------------- Physician Orders Details Patient Name: [name] Date of Service: 06/02/2018 1:00 PM Medical Record Number: [id number] Patient Account Number: [id number] Date of Birth/Sex: Jun 03, 1925 (85 y.o. F) Treating RN: Harold Barban Primary Care Provider: Thereasa Distance Other Clinician: Referring  Provider: Thereasa Distance Treating Provider/Extender: Melburn Hake, Anjanae Woehrle Weeks in Treatment: 0 Verbal / Phone Orders: No Diagnosis Coding ICD-10 Coding Code Description I73.89 Other specified peripheral vascular diseases L97.818 Non-pressure chronic ulcer of other part of right lower leg with other specified severity N18.3 Chronic kidney disease, stage 3 (moderate) I48.20 Chronic atrial fibrillation, unspecified Wound Cleansing Wound #1 Right Achilles o Clean wound with Normal Saline. Primary Wound Dressing Wound #1 Right Achilles o Santyl Ointment Secondary Dressing Wound #1 Right Achilles o ABD and Kerlix/Conform Dressing Change Frequency Wound #1 Right Achilles o Change dressing every day. Follow-up Appointments Wound #1 Right Achilles o Return Appointment in 1 week. Medications-please add to medication list. Wound #1 Right Achilles o P.O. Antibiotics - Complete all medications Consults o Vascular - Dr. Delana Meyer, evaluation Radiology o X-ray, ankle - Right Achilles Patient Medications Allergies: Sulfa (Sulfonamide Antibiotics) [place], Brandie J. ([id number]) Notifications Medication Indication Start End Santyl 06/02/2018 DOSE 0 - topical 250 unit/gram ointment - ointment topical applied nickel thick to the wound bed and then cover with a dressing as directed doxycycline hyclate 06/02/2018 DOSE 2 - oral 50 mg capsule - 2 capsule oral taken 2 times a day for 14 days Electronic Signature(s) Signed: 06/02/2018 2:23:55 PM By: Worthy Keeler PA-C Entered By: Worthy Keeler on 06/02/2018 14:23:54 [place], [name] ([id number]) -------------------------------------------------------------------------------- Problem List Details Patient Name: [name] Date of Service: 06/02/2018 1:00 PM Medical Record Number: [id number] Patient Account Number: [id number] Date of Birth/Sex: [birth year] (85 y.o. F) Treating RN: Harold Barban Primary Care Provider: Thereasa Distance  Other Clinician: Referring Provider: Thereasa Distance Treating Provider/Extender: Melburn Hake, Chai Routh Weeks in Treatment: 0 Active Problems ICD-10 Evaluated Encounter Code Description Active Date Today Diagnosis I73.89 Other specified peripheral vascular diseases 06/02/2018 No Yes L97.818 Non-pressure chronic ulcer of other part of right lower leg 06/02/2018 No Yes with other specified severity N18.3 Chronic kidney disease, stage 3 (moderate) 06/02/2018 No Yes I48.20 Chronic atrial fibrillation, unspecified 06/02/2018 No Yes Inactive Problems Resolved Problems  Electronic Signature(s) Signed: 06/02/2018 2:25:44 PM By: Worthy Keeler PA-C Entered By: Worthy Keeler on 06/02/2018 13:30:18 [place]clen[place], [name] ([id number]) -------------------------------------------------------------------------------- Progress Note Details Patient Name: [name] Date of Service: 06/02/2018 1:00 PM Medical Record Number: [id number] Patient Account Number: [id number] Date of Birth/Sex: 05-Oct-1925 (85 y.o. F) Treating RN: Harold Barban Primary Care Provider: Thereasa Distance Other Clinician: Referring Provider: Thereasa Distance Treating Provider/Extender: Melburn Hake, Lovell Roe Weeks in Treatment: 0 Subjective Chief Complaint Information obtained from Patient Right Lower leg Achilles ulcer History of Present Illness (HPI) 06/02/18 patient presents today for initial evaluation our clinic concerning the ulcers that she has on the right Achilles location. 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 She was subsequently supposed to have had a scheduled likely stent placement in December 2019 unfortunately she had a fall with a sub dural hematoma which subsequently had to be evacuated partially at least. For that reason she has never gotten back in with Dr. Delana Meyer to have the vascular procedure. She does not have any appointment scheduled with him at this  time. The patient does have a history of chronic kidney disease stage III, congestive heart failure, trophy relation, and at this point home health who has been coming out has been using [facility]. This is been since she's been home towards the end of December as best we can tell. She does have a hemoglobin A1c of 7.0 which was obtained on 11/18/17. Currently the patient's right lower extremity that showed diminished blood flow according to her arterial study which is dated 02/27/18. At that point her right ABI was 0.89 with a TBI of 0.36 and her left ABI was 1.13 with a TBI of 0.50. Her prior study which was in May on the 13th 2019 showed that she had a right ABI of one and a right TBI of 0.51 with a left ABI of 0.72 and a TBI of 0.50. Obviously since May till now her right leg has diminished as far as the blood flows concerned patient's son who was with her today states that Dr. 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 I do believe that for limb salvage this patient needs to have the above stated procedure with Dr. Delana Meyer as soon as possible. Wound History Patient presents with 1 open wound that has been present for approximately 2 months. Patient has been treating wound in the following manner: medihoney. Laboratory tests have not been performed in the last month. Patient reportedly has not tested positive for an antibiotic resistant organism. Patient reportedly has not tested positive for osteomyelitis. Patient reportedly has had testing performed to evaluate circulation in the legs. Patient History Information obtained from Patient. Allergies Sulfa (Sulfonamide Antibiotics) Family History Cancer - Siblings, Heart Disease - Mother,Siblings,Father, Hypertension - Mother,Father,Siblings, Stroke - Father, No family history of Diabetes, Hereditary Spherocytosis, Kidney Disease, Lung Disease, Seizures, Thyroid Problems, Tuberculosis. Social History Never smoker,  Marital Status - Married, Alcohol Use - Never, Drug Use - No History, Caffeine Use - Daily. Medical History Eyes Denies history of Cataracts, Glaucoma Ear/Nose/Mouth/Throat Daddona, NYKIRA REDDIX. ([id number]) Denies history of Chronic sinus problems/congestion, Middle ear problems Hematologic/Lymphatic Patient has history of Anemia Denies history of Hemophilia, Human Immunodeficiency Virus, Lymphedema, Sickle Cell Disease Respiratory Denies history of Aspiration, Asthma, Chronic Obstructive Pulmonary Disease (COPD), Pneumothorax, Sleep Apnea, Tuberculosis Cardiovascular Patient has history of Arrhythmia - a fib, Congestive Heart Failure, Hypertension Denies history of Angina,  Coronary Artery Disease, Deep Vein Thrombosis, Hypotension, Myocardial Infarction, Peripheral Arterial Disease, Peripheral Venous Disease, Phlebitis, Vasculitis Gastrointestinal Denies history of Cirrhosis , Colitis, Crohn s, Hepatitis A, Hepatitis B, Hepatitis C Endocrine Patient has history of Type II Diabetes Denies history of Type I Diabetes Genitourinary Patient has history of End Stage Renal Disease - CKD stage 3 Immunological Denies history of Lupus Erythematosus, Raynaud s, Scleroderma Integumentary (Skin) Denies history of History of Burn, History of pressure wounds Musculoskeletal Patient has history of Osteoarthritis Denies history of Gout, Rheumatoid Arthritis, Osteomyelitis Neurologic Denies history of Dementia, Neuropathy, Quadriplegia, Paraplegia, Seizure Disorder Oncologic Denies history of Received Chemotherapy, Received Radiation Psychiatric Denies history of Anorexia/bulimia, Confinement Anxiety Patient is treated with Insulin. Blood sugar is tested. Medical And Surgical History Notes Cardiovascular HLD, mitral insufficiency Musculoskeletal 4 broken ribs Review of Systems (ROS) Constitutional Symptoms (General Health) Denies complaints or symptoms of Fatigue, Fever, Chills, Marked Weight  Change. Eyes Complains or has symptoms of Glasses / Contacts - glasses. Denies complaints or symptoms of Dry Eyes, Vision Changes. Ear/Nose/Mouth/Throat Denies complaints or symptoms of Difficult clearing ears, Sinusitis. Hematologic/Lymphatic Denies complaints or symptoms of Bleeding / Clotting Disorders, Human Immunodeficiency Virus. Respiratory Denies complaints or symptoms of Chronic or frequent coughs. Cardiovascular Denies complaints or symptoms of Chest pain, LE edema. Gastrointestinal Denies complaints or symptoms of Frequent diarrhea, Nausea, Vomiting. Endocrine Denies complaints or symptoms of Hepatitis, Thyroid disease, Polydypsia (Excessive Thirst). Genitourinary Complains or has symptoms of Kidney failure/ Dialysis - CKD stage 3. JAYDY, FITZHENRY. ([id number]) Denies complaints or symptoms of Incontinence/dribbling. Immunological Denies complaints or symptoms of Hives, Itching. Integumentary (Skin) Complains or has symptoms of Wounds. Denies complaints or symptoms of Bleeding or bruising tendency, Breakdown, Swelling. Musculoskeletal Denies complaints or symptoms of Muscle Pain, Muscle Weakness. Neurologic Denies complaints or symptoms of Numbness/parasthesias, Focal/Weakness. Psychiatric Denies complaints or symptoms of Anxiety, Claustrophobia. Objective Constitutional sitting or standing blood pressure is within target range for patient.. pulse regular and within target range for patient.Marland Kitchen respirations regular, non-labored and within target range for patient.Marland Kitchen temperature within target range for patient.. Well- nourished and well-hydrated in no acute distress. Vitals Time Taken: 12:57 PM, Height: 63 in, Source: Stated, Weight: 102 lbs, Source: Stated, BMI: 18.1, Temperature: 97.4  F, Pulse: 74 bpm, Respiratory Rate: 16 breaths/min, Blood Pressure: 124/70 mmHg. Eyes conjunctiva clear no eyelid edema noted. pupils equal round and reactive to light and  accommodation. Ears, Nose, Mouth, and Throat no gross abnormality of ear auricles or external auditory canals. normal hearing noted during conversation. mucus membranes moist. Respiratory normal breathing without difficulty. clear to auscultation bilaterally. Cardiovascular regular rate and rhythm with normal S1, S2. Absent posterior tibial and dorsalis pedis pulses bilateral lower extremities. trace pitting edema of the bilateral lower extremities. Gastrointestinal (GI) soft, non-tender, non-distended, +BS. no ventral hernia noted. Musculoskeletal Patient unable to walk without assistance. no significant deformity or arthritic changes, no loss or range of motion, no clubbing. Psychiatric this patient is able to make decisions and demonstrates good insight into disease process. Alert and Oriented x 3. pleasant and cooperative. General Notes: Patient's wound bed currently actually shows to be necrotic tissue overlying the surface of the wound in the [place], Arwilda J. ([id number]) posterior Achilles region on the right. Subsequently this appears to be necrotic tendon as well no debridement was performed today due to the patient's vascular status I think that this would be more detrimental than good. With that being said I do think the necrotic tissue needs to be  loose enough I believe Santyl would be appropriate in this regard. Integumentary (Hair, Skin) Wound #1 status is Open. Original cause of wound was Gradually Appeared. The wound is located on the Right Achilles. The wound measures 3.2cm length x 1.9cm width x 0.1cm depth; 4.775cm^2 area and 0.478cm^3 volume. There is Fat Layer (Subcutaneous Tissue) Exposed exposed. There is no tunneling or undermining noted. There is a medium amount of purulent drainage noted. The wound margin is flat and intact. There is no granulation within the wound bed. There is a large (67-100%) amount of necrotic tissue within the wound bed including Eschar and  Adherent Slough. The periwound skin appearance exhibited: Erythema. The periwound skin appearance did not exhibit: Callus, Crepitus, Excoriation, Induration, Rash, Scarring, Dry/Scaly, Maceration, Atrophie Blanche, Cyanosis, Ecchymosis, Hemosiderin Staining, Mottled, Pallor, Rubor. The surrounding wound skin color is noted with erythema which is circumferential. Periwound temperature was noted as No Abnormality. The periwound has tenderness on palpation. Assessment Active Problems ICD-10 Other specified peripheral vascular diseases Non-pressure chronic ulcer of other part of right lower leg with other specified severity Chronic kidney disease, stage 3 (moderate) Chronic atrial fibrillation, unspecified Plan Wound Cleansing: Wound #1 Right Achilles: Clean wound with Normal Saline. Primary Wound Dressing: Wound #1 Right Achilles: Santyl Ointment Secondary Dressing: Wound #1 Right Achilles: ABD and Kerlix/Conform Dressing Change Frequency: Wound #1 Right Achilles: Change dressing every day. Follow-up Appointments: Wound #1 Right Achilles: Return Appointment in 1 week. Medications-please add to medication list.: Wound #1 Right Achilles: P.O. Antibiotics - Complete all medications Radiology ordered were: X-ray, ankle - Right Achilles Consults ordered were: Vascular - Dr. Delana Meyer, evaluation [place], [name] ([id number]) The following medication(s) was prescribed: Santyl topical 250 unit/gram ointment 0 ointment topical applied nickel thick to the wound bed and then cover with a dressing as directed starting 06/02/2018 doxycycline hyclate oral 50 mg capsule 2 2 capsule oral taken 2 times a day for 14 days starting 06/02/2018 My suggestion currently is gonna be to initiate treatment with [facility] for the time being. Also think that the patient needs to get in to see Dr. Delana Meyer as soon as possible. This was discussed with the patient and her son today as well. There are in agreement with  this plan. Subsequently we are gonna see were things stand at follow-up in one weeks time as part of empiric therapy I am gonna also go ahead and place the patient on doxycycline as it does appear there's a bigger thing on potentially this infection could be something that would be more significant if left unchecked. I am gonna give her 50 mg pills to take two of these two times a day due to her inability to swallow larger pills which I completely understand. We will see were things stand at follow-up. Please see above for specific wound care orders. We will see patient for re-evaluation in 1 week(s) here in the clinic. If anything worsens or changes patient will contact our office for additional recommendations. I'm also sending the patient for an x-ray today further evaluate for the deeper infection.cc Electronic Signature(s) Signed: 06/02/2018 2:25:44 PM By: Worthy Keeler PA-C Entered By: Worthy Keeler on 06/02/2018 14:24:04 [place], [name] ([id number]) -------------------------------------------------------------------------------- ROS/PFSH Details Patient Name: [name] Date of Service: 06/02/2018 1:00 PM Medical Record Number: [id number] Patient Account Number: [id number] Date of Birth/Sex: November 25, 1925 (85 y.o. F) Treating RN: [name] Primary Care Provider: Thereasa Distance Other Clinician: Referring Provider: Thereasa Distance Treating Provider/Extender: Melburn Hake, Casimer Russett Weeks in Treatment:  0 Information Obtained From Patient Wound History Do you currently have one or more open woundso Yes How many open wounds do you currently haveo 1 Approximately how long have you had your woundso 2 months How have you been treating your wound(s) until nowo medihoney Has your wound(s) ever healed and then re-openedo No Have you had any lab work done in the past montho No Have you tested positive for an antibiotic resistant organism (MRSA, VRE)o No Have you tested positive for  osteomyelitis (bone infection)o No Have you had any tests for circulation on your legso Yes Where was the test doneo [facility] Constitutional Symptoms (General Health) Complaints and Symptoms: Negative for: Fatigue; Fever; Chills; Marked Weight Change Eyes Complaints and Symptoms: Positive for: Glasses / Contacts - glasses Negative for: Dry Eyes; Vision Changes Medical History: Negative for: Cataracts; Glaucoma Ear/Nose/Mouth/Throat Complaints and Symptoms: Negative for: Difficult clearing ears; Sinusitis Medical History: Negative for: Chronic sinus problems/congestion; Middle ear problems Hematologic/Lymphatic Complaints and Symptoms: Negative for: Bleeding / Clotting Disorders; Human Immunodeficiency Virus Medical History: Positive for: Anemia Negative for: Hemophilia; Human Immunodeficiency Virus; Lymphedema; Sickle Cell Disease Respiratory Complaints and Symptoms: Negative for: Chronic or frequent coughs Basulto, Dewey J. ([id number]) Medical History: Negative for: Aspiration; Asthma; Chronic Obstructive Pulmonary Disease (COPD); Pneumothorax; Sleep Apnea; Tuberculosis Cardiovascular Complaints and Symptoms: Negative for: Chest pain; LE edema Medical History: Positive for: Arrhythmia - a fib; Congestive Heart Failure; Hypertension Negative for: Angina; Coronary Artery Disease; Deep Vein Thrombosis; Hypotension; Myocardial Infarction; Peripheral Arterial Disease; Peripheral Venous Disease; Phlebitis; Vasculitis Past Medical History Notes: HLD, mitral insufficiency Gastrointestinal Complaints and Symptoms: Negative for: Frequent diarrhea; Nausea; Vomiting Medical History: Negative for: Cirrhosis ; Colitis; Crohnos; Hepatitis A; Hepatitis B; Hepatitis C Endocrine Complaints and Symptoms: Negative for: Hepatitis; Thyroid disease; Polydypsia (Excessive Thirst) Medical History: Positive for: Type II Diabetes Negative for: Type I Diabetes Treated with: Insulin Blood sugar  tested every day: Yes Tested : Genitourinary Complaints and Symptoms: Positive for: Kidney failure/ Dialysis - CKD stage 3 Negative for: Incontinence/dribbling Medical History: Positive for: End Stage Renal Disease - CKD stage 3 Immunological Complaints and Symptoms: Negative for: Hives; Itching Medical History: Negative for: Lupus Erythematosus; Raynaudos; Scleroderma Integumentary (Skin) Complaints and Symptoms: Positive for: Wounds Negative for: Bleeding or bruising tendency; Breakdown; Swelling Ossa, Karmela J. ([id number]) Medical History: Negative for: History of Burn; History of pressure wounds Musculoskeletal Complaints and Symptoms: Negative for: Muscle Pain; Muscle Weakness Medical History: Positive for: Osteoarthritis Negative for: Gout; Rheumatoid Arthritis; Osteomyelitis Past Medical History Notes: 4 broken ribs Neurologic Complaints and Symptoms: Negative for: Numbness/parasthesias; Focal/Weakness Medical History: Negative for: Dementia; Neuropathy; Quadriplegia; Paraplegia; Seizure Disorder Psychiatric Complaints and Symptoms: Negative for: Anxiety; Claustrophobia Medical History: Negative for: Anorexia/bulimia; Confinement Anxiety Oncologic Medical History: Negative for: Received Chemotherapy; Received Radiation Immunizations Pneumococcal Vaccine: Received Pneumococcal Vaccination: Yes Immunization Notes: up to date Implantable Devices Family and Social History Cancer: Yes - Siblings; Diabetes: No; Heart Disease: Yes - Mother,Siblings,Father; Hereditary Spherocytosis: No; Hypertension: Yes - Mother,Father,Siblings; Kidney Disease: No; Lung Disease: No; Seizures: No; Stroke: Yes - Father; Thyroid Problems: No; Tuberculosis: No; Never smoker; Marital Status - Married; Alcohol Use: Never; Drug Use: No History; Caffeine Use: Daily; Financial Concerns: No; Food, Clothing or Shelter Needs: No; Support System Lacking: No; Transportation Concerns: No; Advanced  Directives: Yes; Medical Power of Attorney: Yes - Gaylene Brooks Electronic Signature(s) Signed: 06/02/2018 2:25:44 PM By: Worthy Keeler PA-C Signed: 06/02/2018 4:37:43 PM By: [name] Entered By: [name] on 06/02/2018 13:19:11 [place], [place]  Lenna Sciara ([id number]) -------------------------------------------------------------------------------- SuperBill Details Patient Name: CHERELL, COLVIN Date of Service: 06/02/2018 Medical Record Number: [id number] Patient Account Number: [id number] Date of Birth/Sex: 28-Sep-1925 (85 y.o. F) Treating RN: Harold Barban Primary Care Provider: Thereasa Distance Other Clinician: Referring Provider: Thereasa Distance Treating Provider/Extender: Melburn Hake, Khandi Kernes Weeks in Treatment: 0 Diagnosis Coding ICD-10 Codes Code Description I73.89 Other specified peripheral vascular diseases L97.818 Non-pressure chronic ulcer of other part of right lower leg with other specified severity N18.3 Chronic kidney disease, stage 3 (moderate) I48.20 Chronic atrial fibrillation, unspecified Facility Procedures CPT4 Code: [id number] Description: 99213 - WOUND CARE VISIT-LEV 3 EST PT Modifier: Quantity: 1 Physician Procedures CPT4 Code Description: [id number] 47207 - WC PHYS LEVEL 4 - NEW PT ICD-10 Diagnosis Description I73.89 Other specified peripheral vascular diseases L97.818 Non-pressure chronic ulcer of other part of right lower leg wit N18.3 Chronic kidney disease, stage 3  (moderate) I48.20 Chronic atrial fibrillation, unspecified Modifier: h other specified Quantity: 1 severity Electronic Signature(s) Signed: 06/02/2018 2:25:44 PM By: Worthy Keeler PA-C Entered By: Worthy Keeler on 06/02/2018 14:24:15

## 2018-06-05 ENCOUNTER — Other Ambulatory Visit (INDEPENDENT_AMBULATORY_CARE_PROVIDER_SITE_OTHER): Payer: Self-pay | Admitting: Vascular Surgery

## 2018-06-05 ENCOUNTER — Telehealth (INDEPENDENT_AMBULATORY_CARE_PROVIDER_SITE_OTHER): Payer: Self-pay | Admitting: Vascular Surgery

## 2018-06-05 DIAGNOSIS — I739 Peripheral vascular disease, unspecified: Secondary | ICD-10-CM

## 2018-06-05 NOTE — Telephone Encounter (Signed)
I spoke with Schnier and the patient need to be schedule for a asap abi and right le arterial see provider

## 2018-06-06 ENCOUNTER — Encounter (INDEPENDENT_AMBULATORY_CARE_PROVIDER_SITE_OTHER): Payer: Self-pay

## 2018-06-06 NOTE — Progress Notes (Signed)
[name], [name] ([id number]) Visit Report for 06/02/2018 Allergy List Details Patient Name: [name], [name] Date of Service: 06/02/2018 1:00 PM Medical Record Number: [id number] Patient Account Number: [id number] Date of Birth/Sex: 1926/01/04 (85 y.o. F) Treating RN: [name] Primary Care Sheralyn Pinegar: Thereasa Distance Other Clinician: Referring Tykee Heideman: Thereasa Distance Treating Lorelie Biermann/Extender: Melburn Hake, HOYT Weeks in Treatment: 0 Allergies Active Allergies Sulfa (Sulfonamide Antibiotics) Allergy Notes Electronic Signature(s) Signed: 06/02/2018 4:37:43 PM By: [name] Entered By: [name] on 06/02/2018 13:11:37 Finnigan, [name] ([id number]) -------------------------------------------------------------------------------- Arrival Information Details Patient Name: [name] Date of Service: 06/02/2018 1:00 PM Medical Record Number: [id number] Patient Account Number: [id number] Date of Birth/Sex: 1925-07-18 (85 y.o. F) Treating RN: Harold Barban Primary Care Klever Twyford: Thereasa Distance Other Clinician: Referring Remona Boom: Thereasa Distance Treating Keelyn Fjelstad/Extender: Melburn Hake, HOYT Weeks in Treatment: 0 Visit Information Patient Arrived: Wheel Chair Arrival Time: 12:56 Accompanied By: son [name] Transfer Assistance: Manual Patient Identification Verified: Yes Secondary Verification Process Completed: Yes Patient Has Alerts: Yes Patient Alerts: DMII Electronic Signature(s) Signed: 06/02/2018 4:37:43 PM By: [name] Entered By: [name] on 06/02/2018 13:23:35 [name], [name] ([id number]) -------------------------------------------------------------------------------- Clinic Level of Care Assessment Details Patient Name: [name] Date of Service: 06/02/2018 1:00 PM Medical Record Number: [id number] Patient Account Number: [id number] Date of Birth/Sex: 10-23-1925 (85 y.o. F) Treating RN: Harold Barban Primary Care Kealohilani Maiorino: Thereasa Distance Other  Clinician: Referring Emberly Tomasso: Thereasa Distance Treating Netha Dafoe/Extender: Melburn Hake, HOYT Weeks in Treatment: 0 Clinic Level of Care Assessment Items TOOL 4 Quantity Score []  - Use when only an EandM is performed on FOLLOW-UP visit 0 ASSESSMENTS - Nursing Assessment / Reassessment X - Reassessment of Co-morbidities (includes updates in patient status) 1 10 X- 1 5 Reassessment of Adherence to Treatment Plan ASSESSMENTS - Wound and Skin Assessment / Reassessment X - Simple Wound Assessment / Reassessment - one wound 1 5 []  - 0 Complex Wound Assessment / Reassessment - multiple wounds []  - 0 Dermatologic / Skin Assessment (not related to wound area) ASSESSMENTS - Focused Assessment []  - Circumferential Edema Measurements - multi extremities 0 []  - 0 Nutritional Assessment / Counseling / Intervention []  - 0 Lower Extremity Assessment (monofilament, tuning fork, pulses) []  - 0 Peripheral Arterial Disease Assessment (using hand held doppler) ASSESSMENTS - Ostomy and/or Continence Assessment and Care []  - Incontinence Assessment and Management 0 []  - 0 Ostomy Care Assessment and Management (repouching, etc.) PROCESS - Coordination of Care X - Simple Patient / Family Education for ongoing care 1 15 []  - 0 Complex (extensive) Patient / Family Education for ongoing care X- 1 10 Staff obtains Programmer, systems, Records, Test Results / Process Orders X- 1 10 Staff telephones HHA, Nursing Homes / Clarify orders / etc []  - 0 Routine Transfer to another Facility (non-emergent condition) []  - 0 Routine Hospital Admission (non-emergent condition) []  - 0 New Admissions / Biomedical engineer / Ordering NPWT, Apligraf, etc. []  - 0 Emergency Hospital Admission (emergent condition) X- 1 10 Simple Discharge Coordination Monarrez, Zaiya J. ([id number]) []  - 0 Complex (extensive) Discharge Coordination PROCESS - Special Needs []  - Pediatric / Minor Patient Management 0 []  - 0 Isolation Patient  Management []  - 0 Hearing / Language / Visual special needs []  - 0 Assessment of Community assistance (transportation, D/C planning, etc.) []  - 0 Additional assistance / Altered mentation []  - 0 Support Surface(s) Assessment (bed, cushion, seat, etc.) INTERVENTIONS - Wound Cleansing / Measurement X - Simple Wound Cleansing - one wound 1 5 []  -  0 Complex Wound Cleansing - multiple wounds X- 1 5 Wound Imaging (photographs - any number of wounds) []  - 0 Wound Tracing (instead of photographs) X- 1 5 Simple Wound Measurement - one wound []  - 0 Complex Wound Measurement - multiple wounds INTERVENTIONS - Wound Dressings X - Small Wound Dressing one or multiple wounds 1 10 []  - 0 Medium Wound Dressing one or multiple wounds []  - 0 Large Wound Dressing one or multiple wounds X- 1 5 Application of Medications - topical []  - 0 Application of Medications - injection INTERVENTIONS - Miscellaneous []  - External ear exam 0 []  - 0 Specimen Collection (cultures, biopsies, blood, body fluids, etc.) []  - 0 Specimen(s) / Culture(s) sent or taken to Lab for analysis []  - 0 Patient Transfer (multiple staff / Civil Service fast streamer / Similar devices) []  - 0 Simple Staple / Suture removal (25 or less) []  - 0 Complex Staple / Suture removal (26 or more) []  - 0 Hypo / Hyperglycemic Management (close monitor of Blood Glucose) []  - 0 Ankle / Brachial Index (ABI) - do not check if billed separately X- 1 5 Vital Signs [name], Kathleen J. ([id number]) Has the patient been seen at the hospital within the last three years: Yes Total Score: 100 Level Of Care: New/Established - Level 3 Electronic Signature(s) Signed: 06/06/2018 11:13:25 AM By: Harold Barban Entered By: Harold Barban on 06/02/2018 13:48:06 [name], [name] ([id number]) -------------------------------------------------------------------------------- Encounter Discharge Information Details Patient Name: [name] Date of Service: 06/02/2018  1:00 PM Medical Record Number: [id number] Patient Account Number: [id number] Date of Birth/Sex: 1925/07/19 (85 y.o. F) Treating RN: Army Melia Primary Care Roshini Fulwider: Thereasa Distance Other Clinician: Referring Aleesa Sweigert: Thereasa Distance Treating Marialena Wollen/Extender: Melburn Hake, HOYT Weeks in Treatment: 0 Encounter Discharge Information Items Discharge Condition: Stable Ambulatory Status: Wheelchair Discharge Destination: Home Transportation: Private Auto Accompanied By: son Schedule Follow-up Appointment: Yes Clinical Summary of Care: Electronic Signature(s) Signed: 06/02/2018 1:56:11 PM By: Army Melia Entered By: Army Melia on 06/02/2018 13:56:10 Blankenbaker, [name] ([id number]) -------------------------------------------------------------------------------- Lower Extremity Assessment Details Patient Name: [name] Date of Service: 06/02/2018 1:00 PM Medical Record Number: [id number] Patient Account Number: [id number] Date of Birth/Sex: 08-21-1925 (85 y.o. F) Treating RN: [name] Primary Care Shantara Goosby: Thereasa Distance Other Clinician: Referring Riese Hellard: Thereasa Distance Treating Destiny Hagin/Extender: Melburn Hake, HOYT Weeks in Treatment: 0 Edema Assessment Assessed: [Left: No] [Right: No] Edema: [Left: No] [Right: No] Vascular Assessment Pulses: Dorsalis Pedis Palpable: [Left:Yes] [Right:Yes] Doppler Audible: [Left:Yes] [Right:Yes] Posterior Tibial Palpable: [Left:Yes] [Right:Yes] Doppler Audible: [Left:Yes] [Right:Yes] Extremity colors, hair growth, and conditions: Extremity Color: [Left:Normal] [Right:Normal] Hair Growth on Extremity: [Left:No] [Right:No] Temperature of Extremity: [Left:Cold] [Right:Cold] Capillary Refill: [Left:> 3 seconds] [Right:> 3 seconds] Toe Nail Assessment Left: Right: Thick: Yes Yes Discolored: Yes Yes Deformed: Yes Yes Improper Length and Hygiene: Yes Yes Notes ABI 02/27/18 L 1.13 R .89 with TBI L.5 and R .36 Electronic  Signature(s) Signed: 06/02/2018 4:37:43 PM By: [name] Entered By: [name] on 06/02/2018 13:23:23 [name], [name] ([id number]) -------------------------------------------------------------------------------- Multi Wound Chart Details Patient Name: [name] Date of Service: 06/02/2018 1:00 PM Medical Record Number: [id number] Patient Account Number: [id number] Date of Birth/Sex: [birth year] (85 y.o. F) Treating RN: Harold Barban Primary Care Tarryn Bogdan: Thereasa Distance Other Clinician: Referring Kerith Sherley: Thereasa Distance Treating Ineta Sinning/Extender: Melburn Hake, HOYT Weeks in Treatment: 0 Vital Signs Height(in): 63 Pulse(bpm): 74 Weight(lbs): 102 Blood Pressure(mmHg): 124/70 Body Mass Index(BMI): 18 Temperature(F): 97.4 Respiratory Rate 16 (breaths/min): Photos: [1:No Photos] [N/A:N/A] Wound Location: [1:Right  Achilles] [N/A:N/A] Wounding Event: [1:Gradually Appeared] [N/A:N/A] Primary Etiology: [1:Arterial Insufficiency Ulcer] [N/A:N/A] Secondary Etiology: [1:Diabetic Wound/Ulcer of the Lower Extremity] [N/A:N/A] Comorbid History: [1:Anemia, Arrhythmia, Congestive Heart Failure, Hypertension, Type II Diabetes, End Stage Renal Disease, Osteoarthritis] [N/A:N/A] Date Acquired: [1:04/01/2018] [N/A:N/A] Weeks of Treatment: [1:0] [N/A:N/A] Wound Status: [1:Open] [N/A:N/A] Pending Amputation on [1:Yes] [N/A:N/A] Presentation: Measurements L x W x D [1:3.2x1.9x0.1] [N/A:N/A] (cm) Area (cm) : [1:4.775] [N/A:N/A] Volume (cm) : [1:0.478] [N/A:N/A] Classification: [1:Unclassifiable] [N/A:N/A] Exudate Amount: [1:Medium] [N/A:N/A] Exudate Type: [1:Purulent] [N/A:N/A] Exudate Color: [1:yellow, brown, green] [N/A:N/A] Wound Margin: [1:Flat and Intact] [N/A:N/A] Granulation Amount: [1:None Present (0%)] [N/A:N/A] Necrotic Amount: [1:Large (67-100%)] [N/A:N/A] Necrotic Tissue: [1:Eschar, Adherent Slough] [N/A:N/A] Exposed Structures: [1:Fat Layer (Subcutaneous  Tissue) Exposed: Yes Fascia: No Tendon: No Muscle: No Joint: No Bone: No] [N/A:N/A] Epithelialization: [1:None] [N/A:N/A] Periwound Skin Texture: [N/A:N/A] Excoriation: No Induration: No Callus: No Crepitus: No Rash: No Scarring: No Periwound Skin Moisture: Maceration: No N/A N/A Dry/Scaly: No Periwound Skin Color: Erythema: Yes N/A N/A Atrophie Blanche: No Cyanosis: No Ecchymosis: No Hemosiderin Staining: No Mottled: No Pallor: No Rubor: No Erythema Location: Circumferential N/A N/A Temperature: No Abnormality N/A N/A Tenderness on Palpation: Yes N/A N/A Wound Preparation: Ulcer Cleansing: N/A N/A Rinsed/Irrigated with Saline Topical Anesthetic Applied: Other: lidocaine 4% Treatment Notes Electronic Signature(s) Signed: 06/06/2018 11:13:25 AM By: Harold Barban Entered By: Harold Barban on 06/02/2018 13:34:36 [name], [name] ([id number]) -------------------------------------------------------------------------------- [place] Details Patient Name: [name] Date of Service: 06/02/2018 1:00 PM Medical Record Number: [id number] Patient Account Number: [id number] Date of Birth/Sex: 12-10-1925 (85 y.o. F) Treating RN: Harold Barban Primary Care Landree Fernholz: Thereasa Distance Other Clinician: Referring Lennis Korb: Thereasa Distance Treating Chay Mazzoni/Extender: Melburn Hake, HOYT Weeks in Treatment: 0 Active Inactive Wound/Skin Impairment Nursing Diagnoses: Impaired tissue integrity Knowledge deficit related to ulceration/compromised skin integrity Goals: Ulcer/skin breakdown will have a volume reduction of 30% by week 4 Date Initiated: 06/02/2018 Target Resolution Date: 07/01/2018 Goal Status: Active Interventions: Assess patient/caregiver ability to obtain necessary supplies Assess patient/caregiver ability to perform ulcer/skin care regimen upon admission and as needed Assess ulceration(s) every visit Notes: Electronic Signature(s) Signed:  06/06/2018 11:13:25 AM By: Harold Barban Entered By: Harold Barban on 06/02/2018 13:34:01 [name], [name] ([id number]) -------------------------------------------------------------------------------- Pain Assessment Details Patient Name: [name] Date of Service: 06/02/2018 1:00 PM Medical Record Number: [id number] Patient Account Number: [id number] Date of Birth/Sex: 08/13/1925 (85 y.o. F) Treating RN: Harold Barban Primary Care Claribel Sachs: Thereasa Distance Other Clinician: Referring Makih Stefanko: Thereasa Distance Treating Meili Kleckley/Extender: Melburn Hake, HOYT Weeks in Treatment: 0 Active Problems Location of Pain Severity and Description of Pain Patient Has Paino Yes Site Locations Rate the pain. Current Pain Level: 8 Pain Management and Medication Current Pain Management: Electronic Signature(s) Signed: 06/02/2018 3:46:48 PM By: Lorine Bears RCP, RRT, CHT Signed: 06/06/2018 11:13:25 AM By: Harold Barban Entered By: Lorine Bears on 06/02/2018 12:57:20 [name], [name] ([id number]) -------------------------------------------------------------------------------- Patient/Caregiver Education Details Patient Name: [name] Date of Service: 06/02/2018 1:00 PM Medical Record Number: [id number] Patient Account Number: [id number] Date of Birth/Gender: 1926/02/03 (85 y.o. F) Treating RN: Harold Barban Primary Care Physician: Thereasa Distance Other Clinician: Referring Physician: Thereasa Distance Treating Physician/Extender: Sharalyn Ink in Treatment: 0 Education Assessment Education Provided To: Patient Education Topics Provided Wound/Skin Impairment: Handouts: Caring for Your Ulcer Methods: Demonstration, Explain/Verbal Responses: State content correctly Electronic Signature(s) Signed: 06/06/2018 11:13:25 AM By: Harold Barban Entered By: Harold Barban on 06/02/2018 13:34:49 [name], [name]  ([id number]) -------------------------------------------------------------------------------- Wound Assessment Details  Patient Name: [name], [name] Date of Service: 06/02/2018 1:00 PM Medical Record Number: [id number] Patient Account Number: [id number] Date of Birth/Sex: 04-17-1925 (85 y.o. 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 Wounding Event: Gradually Appeared Etiology: Date Acquired: 04/01/2018 Wound Open Weeks Of Treatment: 0 Status: Clustered Wound: No Comorbid Anemia, Arrhythmia, Congestive Heart Failure, Pending Amputation On Presentation History: Hypertension, Type II Diabetes, End Stage Renal Disease, Osteoarthritis Photos Photo Uploaded By: [name] on 06/02/2018 16:35:13 Wound Measurements Length: (cm) 3.2 Width: (cm) 1.9 Depth: (cm) 0.1 Area: (cm) 4.775 Volume: (cm) 0.478 % Reduction in Area: % Reduction in Volume: Epithelialization: None Tunneling: No Undermining: No Wound Description Classification: Unclassifiable Wound Margin: Flat and Intact Exudate Amount: Medium Exudate Type: Purulent Exudate Color: yellow, brown, green Foul Odor After Cleansing: No Slough/Fibrino Yes Wound Bed Granulation Amount: None Present (0%) Exposed Structure Necrotic Amount: Large (67-100%) Fascia Exposed: No Necrotic Quality: Eschar, Adherent Slough Fat Layer (Subcutaneous Tissue) Exposed: Yes Tendon Exposed: No Muscle Exposed: No Joint Exposed: No Bone Exposed: No [name], Kathleen J. ([id number]) Periwound Skin Texture Texture Color No Abnormalities Noted: No No Abnormalities Noted: No Callus: No Atrophie Blanche: No Crepitus: No Cyanosis:  No Excoriation: No Ecchymosis: No Induration: No Erythema: Yes Rash: No Erythema Location: Circumferential Scarring: No Hemosiderin Staining: No Mottled: No Moisture Pallor: No No Abnormalities Noted: No Rubor: No Dry / Scaly: No Maceration: No Temperature / Pain Temperature: No Abnormality Tenderness on Palpation: Yes Wound Preparation Ulcer Cleansing: Rinsed/Irrigated with Saline Topical Anesthetic Applied: Other: lidocaine 4%, Treatment Notes Wound #1 (Right Achilles) 1. Cleansed with: Clean wound with Normal Saline 4. Dressing Applied: Santyl Ointment 5. Secondary Dressing Applied Bordered Foam Dressing Electronic Signature(s) Signed: 06/02/2018 4:37:43 PM By: [name] Entered By: [name] on 06/02/2018 13:20:19 [name], [name] ([id number]) -------------------------------------------------------------------------------- Vitals Details Patient Name: [name] Date of Service: 06/02/2018 1:00 PM Medical Record Number: [id number] Patient Account Number: [id number] Date of Birth/Sex: 1925/06/16 (85 y.o. F) Treating RN: Harold Barban Primary Care Marteze Vecchio: Thereasa Distance Other Clinician: Referring Gaelyn Tukes: Thereasa Distance Treating Cameryn Schum/Extender: Melburn Hake, HOYT Weeks in Treatment: 0 Vital Signs Time Taken: 12:57 Temperature (F): 97.4 Height (in): 63 Pulse (bpm): 74 Source: Stated Respiratory Rate (breaths/min): 16 Weight (lbs): 102 Blood Pressure (mmHg): 124/70 Source: Stated Reference Range: 80 - 120 mg / dl Body Mass Index (BMI): 18.1 Electronic Signature(s) Signed: 06/02/2018 3:46:48 PM By: Lorine Bears RCP, RRT, CHT Entered By: Lorine Bears on 06/02/2018 13:01:36

## 2018-06-07 ENCOUNTER — Encounter (INDEPENDENT_AMBULATORY_CARE_PROVIDER_SITE_OTHER): Payer: Medicare Other

## 2018-06-07 ENCOUNTER — Ambulatory Visit (INDEPENDENT_AMBULATORY_CARE_PROVIDER_SITE_OTHER): Payer: Medicare Other | Admitting: Vascular Surgery

## 2018-06-07 ENCOUNTER — Other Ambulatory Visit (INDEPENDENT_AMBULATORY_CARE_PROVIDER_SITE_OTHER): Payer: Self-pay | Admitting: Nurse Practitioner

## 2018-06-09 ENCOUNTER — Encounter: Payer: Medicare Other | Admitting: Physician Assistant

## 2018-06-09 DIAGNOSIS — L97818 Non-pressure chronic ulcer of other part of right lower leg with other specified severity: Secondary | ICD-10-CM | POA: Diagnosis not present

## 2018-06-11 NOTE — Progress Notes (Signed)
RILLIE, RIFFEL ([id number]) Visit Report for 06/09/2018 Chief Complaint Document Details Patient Name: [name], [name] Date of Service: 06/09/2018 3:00 PM Medical Record Number: [id number] Patient Account Number: [id number] Date of Birth/Sex: Sep 17, 1925 (85 y.o. Female) Treating RN: Harold Barban Primary Care Provider: Thereasa Distance Other Clinician: Referring Provider: Thereasa Distance Treating Provider/Extender: Melburn Hake, HOYT Weeks in Treatment: 1 Information Obtained from: Patient Chief Complaint Right Lower leg Achilles ulcer Electronic Signature(s) Signed: 06/09/2018 5:19:05 PM By: Worthy Keeler PA-C Entered By: Worthy Keeler on 06/09/2018 15:36:42 Resurreccion, [name] ([id number]) -------------------------------------------------------------------------------- HPI Details Patient Name: [name] Date of Service: 06/09/2018 3:00 PM Medical Record Number: [id number] Patient Account Number: [id number] Date of Birth/Sex: 10-23-1925 (85 y.o. Female) Treating RN: Harold Barban Primary Care Provider: Thereasa Distance Other Clinician: Referring Provider: Thereasa Distance Treating Provider/Extender: Melburn Hake, HOYT Weeks in Treatment: 1 History of Present Illness HPI Description: 06/02/18 patient presents today for initial evaluation our clinic concerning the ulcers that she has on the right Achilles location. 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 She was subsequently supposed to have had a scheduled likely stent placement in December 2019 unfortunately she had a fall with a sub dural hematoma which subsequently had to be evacuated partially at least. For that reason she has never gotten back in with Dr. Delana Meyer to have the vascular procedure. She does not have any appointment scheduled with him at this time. The patient does have a history of chronic kidney disease stage III, congestive heart failure, trophy  relation, and at this point home health who has been coming out has been using [facility]. This is been since she's been home towards the end of December as best we can tell. She does have a hemoglobin A1c of 7.0 which was obtained on 11/18/17. Currently the patient's right lower extremity that showed diminished blood flow according to her arterial study which is dated 02/27/18. At that point her right ABI was 0.89 with a TBI of 0.36 and her left ABI was 1.13 with a TBI of 0.50. Her prior study which was in May on the 13th 2019 showed that she had a right ABI of one and a right TBI of 0.51 with a left ABI of 0.72 and a TBI of 0.50. Obviously since May till now her right leg has diminished as far as the blood flows concerned patient's son who was with her today states that Dr. 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 I do believe that for limb salvage this patient needs to have the above stated procedure with Dr. Delana Meyer as soon as possible. 06/09/18 on evaluation today patient actually appears to be doing about the same in regard to her Achilles ulcer. This actually does have tended exposed however the tenant is remaining moist which is good news that's exactly what we want to see. Fortunately there's no signs of infection she does have an appointment with Dr. Delana Meyer this coming Tuesday, June 13, 2018. Electronic Signature(s) Signed: 06/09/2018 5:19:05 PM By: Worthy Keeler PA-C Entered By: Worthy Keeler on 06/09/2018 15:58:42 Stotler, [name] ([id number]) -------------------------------------------------------------------------------- Physical Exam Details Patient Name: [name] Date of Service: 06/09/2018 3:00 PM Medical Record Number: [id number] Patient Account Number: [id number] Date of Birth/Sex: [birth year] (85 y.o. Female) Treating RN: Harold Barban Primary Care Provider: Thereasa Distance Other Clinician: Referring Provider: Thereasa Distance Treating  Provider/Extender: Melburn Hake, HOYT Weeks in Treatment:  1 Constitutional Well-nourished and well-hydrated in no acute distress. Respiratory normal breathing without difficulty. clear to auscultation bilaterally. Cardiovascular regular rate and rhythm with normal S1, S2. Psychiatric this patient is able to make decisions and demonstrates good insight into disease process. Alert and Oriented x 3. pleasant and cooperative. Notes Patient's wound bed currently shows evidence of exposed tendon with some of this be necrotic but fortunately nothing appears to be worse compared to last week. Fortunately there is no signs of worsening infection. She is tolerating the doxycycline very well. At this time which is also good news. I'm very pleased in this regard. Electronic Signature(s) Signed: 06/09/2018 5:19:05 PM By: Worthy Keeler PA-C Entered By: Worthy Keeler on 06/09/2018 15:59:42 Tinch, [name] ([id number]) -------------------------------------------------------------------------------- Physician Orders Details Patient Name: [name] Date of Service: 06/09/2018 3:00 PM Medical Record Number: [id number] Patient Account Number: [id number] Date of Birth/Sex: 10-30-1925 (85 y.o. Female) Treating RN: Harold Barban Primary Care Provider: Thereasa Distance Other Clinician: Referring Provider: Thereasa Distance Treating Provider/Extender: Melburn Hake, HOYT Weeks in Treatment: 1 Verbal / Phone Orders: No Diagnosis Coding ICD-10 Coding Code Description I73.89 Other specified peripheral vascular diseases L97.818 Non-pressure chronic ulcer of other part of right lower leg with other specified severity N18.3 Chronic kidney disease, stage 3 (moderate) I48.20 Chronic atrial fibrillation, unspecified Wound Cleansing Wound #1 Right Achilles o Clean wound with Normal Saline. Primary Wound Dressing Wound #1 Right Achilles o Santyl Ointment Secondary Dressing Wound #1 Right Achilles o ABD  and Kerlix/Conform Dressing Change Frequency Wound #1 Right Achilles o Change dressing every day. Follow-up Appointments Wound #1 Right Achilles o Return Appointment in 1 week. Medications-please add to medication list. Wound #1 Right Achilles o P.O. Antibiotics - Complete all medications Electronic Signature(s) Signed: 06/09/2018 5:19:05 PM By: Worthy Keeler PA-C Signed: 06/09/2018 5:19:20 PM By: Harold Barban Entered By: Harold Barban on 06/09/2018 15:57:30 Chalfant, [name] ([id number]) -------------------------------------------------------------------------------- Problem List Details Patient Name: [name] Date of Service: 06/09/2018 3:00 PM Medical Record Number: [id number] Patient Account Number: [id number] Date of Birth/Sex: 07-02-1925 (85 y.o. Female) Treating RN: Harold Barban Primary Care Provider: Thereasa Distance Other Clinician: Referring Provider: Thereasa Distance Treating Provider/Extender: Melburn Hake, HOYT Weeks in Treatment: 1 Active Problems ICD-10 Evaluated Encounter Code Description Active Date Today Diagnosis I73.89 Other specified peripheral vascular diseases 06/02/2018 No Yes L97.818 Non-pressure chronic ulcer of other part of right lower leg 06/02/2018 No Yes with other specified severity N18.3 Chronic kidney disease, stage 3 (moderate) 06/02/2018 No Yes I48.20 Chronic atrial fibrillation, unspecified 06/02/2018 No Yes Inactive Problems Resolved Problems Electronic Signature(s) Signed: 06/09/2018 5:19:05 PM By: Worthy Keeler PA-C Entered By: Worthy Keeler on 06/09/2018 15:36:38 Spilde, [name] ([id number]) -------------------------------------------------------------------------------- Progress Note Details Patient Name: [name] Date of Service: 06/09/2018 3:00 PM Medical Record Number: [id number] Patient Account Number: [id number] Date of Birth/Sex: Apr 28, 1925 (85 y.o. Female) Treating RN: Harold Barban Primary Care Provider:  Thereasa Distance Other Clinician: Referring Provider: Thereasa Distance Treating Provider/Extender: Melburn Hake, HOYT Weeks in Treatment: 1 Subjective Chief Complaint Information obtained from Patient Right Lower leg Achilles ulcer History of Present Illness (HPI) 06/02/18 patient presents today for initial evaluation our clinic concerning the ulcers that she has on the right Achilles location. 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 She was subsequently supposed to have had a scheduled likely stent placement in December 2019 unfortunately she had a fall with a sub dural hematoma  which subsequently had to be evacuated partially at least. For that reason she has never gotten back in with Dr. Delana Meyer to have the vascular procedure. She does not have any appointment scheduled with him at this time. The patient does have a history of chronic kidney disease stage III, congestive heart failure, trophy relation, and at this point home health who has been coming out has been using [facility]. This is been since she's been home towards the end of December as best we can tell. She does have a hemoglobin A1c of 7.0 which was obtained on 11/18/17. Currently the patient's right lower extremity that showed diminished blood flow according to her arterial study which is dated 02/27/18. At that point her right ABI was 0.89 with a TBI of 0.36 and her left ABI was 1.13 with a TBI of 0.50. Her prior study which was in May on the 13th 2019 showed that she had a right ABI of one and a right TBI of 0.51 with a left ABI of 0.72 and a TBI of 0.50. Obviously since May till now her right leg has diminished as far as the blood flows concerned patient's son who was with her today states that Dr. 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 I do believe that for limb salvage this patient needs to have the above stated procedure with Dr.  Delana Meyer as soon as possible. 06/09/18 on evaluation today patient actually appears to be doing about the same in regard to her Achilles ulcer. This actually does have tended exposed however the tenant is remaining moist which is good news that's exactly what we want to see. Fortunately there's no signs of infection she does have an appointment with Dr. Delana Meyer this coming Tuesday, June 13, 2018. Patient History Information obtained from Patient. Family History Cancer - Siblings, Heart Disease - Mother,Siblings,Father, Hypertension - Mother,Father,Siblings, Stroke - Father, No family history of Diabetes, Hereditary Spherocytosis, Kidney Disease, Lung Disease, Seizures, Thyroid Problems, Tuberculosis. Social History Never smoker, Marital Status - Married, Alcohol Use - Never, Drug Use - No History, Caffeine Use - Daily. Medical History Eyes Denies history of Cataracts, Glaucoma Ear/Nose/Mouth/Throat Denies history of Chronic sinus problems/congestion, Middle ear problems Hematologic/Lymphatic Patient has history of Anemia Denies history of Hemophilia, Human Immunodeficiency Virus, Lymphedema, Sickle Cell Disease Respiratory Denies history of Aspiration, Asthma, Chronic Obstructive Pulmonary Disease (COPD), Pneumothorax, Sleep Apnea, Knoff, Brittny J. ([id number]) Tuberculosis Cardiovascular Patient has history of Arrhythmia - a fib, Congestive Heart Failure, Hypertension Denies history of Angina, Coronary Artery Disease, Deep Vein Thrombosis, Hypotension, Myocardial Infarction, Peripheral Arterial Disease, Peripheral Venous Disease, Phlebitis, Vasculitis Gastrointestinal Denies history of Cirrhosis , Colitis, Crohn s, Hepatitis A, Hepatitis B, Hepatitis C Endocrine Patient has history of Type II Diabetes Denies history of Type I Diabetes Genitourinary Patient has history of End Stage Renal Disease - CKD stage 3 Immunological Denies history of Lupus Erythematosus, Raynaud s,  Scleroderma Integumentary (Skin) Denies history of History of Burn, History of pressure wounds Musculoskeletal Patient has history of Osteoarthritis Denies history of Gout, Rheumatoid Arthritis, Osteomyelitis Neurologic Denies history of Dementia, Neuropathy, Quadriplegia, Paraplegia, Seizure Disorder Oncologic Denies history of Received Chemotherapy, Received Radiation Psychiatric Denies history of Anorexia/bulimia, Confinement Anxiety Medical And Surgical History Notes Cardiovascular HLD, mitral insufficiency Musculoskeletal 4 broken ribs Review of Systems (ROS) Constitutional Symptoms (General Health) Denies complaints or symptoms of Fever, Chills. Respiratory The patient has no complaints or symptoms. Cardiovascular The patient has no complaints or symptoms. Psychiatric The patient  has no complaints or symptoms. Objective Constitutional Well-nourished and well-hydrated in no acute distress. Vitals Time Taken: 3:21 PM, Height: 63 in, Weight: 102 lbs, BMI: 18.1, Temperature: 97.9 F, Pulse: 66 bpm, Respiratory Rate: 16 breaths/min, Blood Pressure: 134/60 mmHg. Yarbro, Aiman J. ([id number]) Respiratory normal breathing without difficulty. clear to auscultation bilaterally. Cardiovascular regular rate and rhythm with normal S1, S2. Psychiatric this patient is able to make decisions and demonstrates good insight into disease process. Alert and Oriented x 3. pleasant and cooperative. General Notes: Patient's wound bed currently shows evidence of exposed tendon with some of this be necrotic but fortunately nothing appears to be worse compared to last week. Fortunately there is no signs of worsening infection. She is tolerating the doxycycline very well. At this time which is also good news. I'm very pleased in this regard. Integumentary (Hair, Skin) Wound #1 status is Open. Original cause of wound was Gradually Appeared. The wound is located on the Right Achilles. The wound  measures 3.2cm length x 1.9cm width x 0.1cm depth; 4.775cm^2 area and 0.478cm^3 volume. There is Fat Layer (Subcutaneous Tissue) Exposed exposed. There is no tunneling or undermining noted. There is a medium amount of serous drainage noted. The wound margin is flat and intact. There is no granulation within the wound bed. There is a small (1-33%) amount of necrotic tissue within the wound bed including Eschar and Adherent Slough. The periwound skin appearance exhibited: Erythema. The periwound skin appearance did not exhibit: Callus, Crepitus, Excoriation, Induration, Rash, Scarring, Dry/Scaly, Maceration, Atrophie Blanche, Cyanosis, Ecchymosis, Hemosiderin Staining, Mottled, Pallor, Rubor. The surrounding wound skin color is noted with erythema which is circumferential. Periwound temperature was noted as No Abnormality. The periwound has tenderness on palpation. Assessment Active Problems ICD-10 Other specified peripheral vascular diseases Non-pressure chronic ulcer of other part of right lower leg with other specified severity Chronic kidney disease, stage 3 (moderate) Chronic atrial fibrillation, unspecified Plan Wound Cleansing: Wound #1 Right Achilles: Clean wound with Normal Saline. Primary Wound Dressing: Wound #1 Right Achilles: Santyl Ointment Secondary Dressing: Wound #1 Right Achilles: ABD and Kerlix/Conform Dressing Change Frequency: Wound #1 Right Achilles: Change dressing every day. BRYNLEE, PENNYWELL ([id number]) Follow-up Appointments: Wound #1 Right Achilles: Return Appointment in 1 week. Medications-please add to medication list.: Wound #1 Right Achilles: P.O. Antibiotics - Complete all medications I'm gonna recommend that we continue with the Current wound care measures we will wait and see what Dr. Delana Meyer says next week following her angiogram. If anything changes meantime patient or family will contact her office and let us know. Please see above for specific  wound care orders. We will see patient for re-evaluation in 1 week(s) here in the clinic. If anything worsens or changes patient will contact our office for additional recommendations. Electronic Signature(s) Signed: 06/09/2018 5:19:05 PM By: Worthy Keeler PA-C Entered By: Worthy Keeler on 06/09/2018 16:00:15 Brissette, [name] ([id number]) -------------------------------------------------------------------------------- ROS/PFSH Details Patient Name: [name] Date of Service: 06/09/2018 3:00 PM Medical Record Number: [id number] Patient Account Number: [id number] Date of Birth/Sex: [birth year] (85 y.o. Female) Treating RN: Harold Barban Primary Care Provider: Thereasa Distance Other Clinician: Referring Provider: Thereasa Distance Treating Provider/Extender: Melburn Hake, HOYT Weeks in Treatment: 1 Information Obtained From Patient Wound History Do you currently have one or more open woundso Yes How many open wounds do you currently haveo 1 Approximately how long have you had your woundso 2 months How have you been treating your wound(s) until nowo [facility] Has your wound(s) ever  healed and then re-openedo No Have you had any lab work done in the past montho No Have you tested positive for an antibiotic resistant organism (MRSA, VRE)o No Have you tested positive for osteomyelitis (bone infection)o No Have you had any tests for circulation on your legso Yes Where was the test doneo [facility] Constitutional Symptoms (General Health) Complaints and Symptoms: Negative for: Fever; Chills Eyes Medical History: Negative for: Cataracts; Glaucoma Ear/Nose/Mouth/Throat Medical History: Negative for: Chronic sinus problems/congestion; Middle ear problems Hematologic/Lymphatic Medical History: Positive for: Anemia Negative for: Hemophilia; Human Immunodeficiency Virus; Lymphedema; Sickle Cell Disease Respiratory Complaints and Symptoms: No Complaints or Symptoms Medical History: Negative  for: Aspiration; Asthma; Chronic Obstructive Pulmonary Disease (COPD); Pneumothorax; Sleep Apnea; Tuberculosis Cardiovascular Complaints and Symptoms: No Complaints or Symptoms Yebra, Anoushka J. ([id number]) Medical History: Positive for: Arrhythmia - a fib; Congestive Heart Failure; Hypertension Negative for: Angina; Coronary Artery Disease; Deep Vein Thrombosis; Hypotension; Myocardial Infarction; Peripheral Arterial Disease; Peripheral Venous Disease; Phlebitis; Vasculitis Past Medical History Notes: HLD, mitral insufficiency Gastrointestinal Medical History: Negative for: Cirrhosis ; Colitis; Crohnos; Hepatitis A; Hepatitis B; Hepatitis C Endocrine Medical History: Positive for: Type II Diabetes Negative for: Type I Diabetes Treated with: Insulin Blood sugar tested every day: Yes Tested : Genitourinary Medical History: Positive for: End Stage Renal Disease - CKD stage 3 Immunological Medical History: Negative for: Lupus Erythematosus; Raynaudos; Scleroderma Integumentary (Skin) Medical History: Negative for: History of Burn; History of pressure wounds Musculoskeletal Medical History: Positive for: Osteoarthritis Negative for: Gout; Rheumatoid Arthritis; Osteomyelitis Past Medical History Notes: 4 broken ribs Neurologic Medical History: Negative for: Dementia; Neuropathy; Quadriplegia; Paraplegia; Seizure Disorder Oncologic Medical History: Negative for: Received Chemotherapy; Received Radiation Psychiatric Complaints and Symptoms: No Complaints or Symptoms Medical History: MINAH, AXELROD ([id number]) Negative for: Anorexia/bulimia; Confinement Anxiety Immunizations Pneumococcal Vaccine: Received Pneumococcal Vaccination: Yes Immunization Notes: up to date Implantable Devices No devices added Family and Social History Cancer: Yes - Siblings; Diabetes: No; Heart Disease: Yes - Mother,Siblings,Father; Hereditary Spherocytosis: No; Hypertension: Yes -  Mother,Father,Siblings; Kidney Disease: No; Lung Disease: No; Seizures: No; Stroke: Yes - Father; Thyroid Problems: No; Tuberculosis: No; Never smoker; Marital Status - Married; Alcohol Use: Never; Drug Use: No History; Caffeine Use: Daily; Financial Concerns: No; Food, Clothing or Shelter Needs: No; Support System Lacking: No; Transportation Concerns: No; Advanced Directives: Yes (Not Provided); Patient does not want information on Advanced Directives; Medical Power of Attorney: Yes - Diany Formosa Joaquim Lai (Not Provided) Physician Affirmation I have reviewed and agree with the above information. Electronic Signature(s) Signed: 06/09/2018 5:19:05 PM By: Worthy Keeler PA-C Signed: 06/09/2018 5:19:20 PM By: Harold Barban Entered By: Worthy Keeler on 06/09/2018 15:58:59 Olshefski, [name] ([id number]) -------------------------------------------------------------------------------- SuperBill Details Patient Name: [name] Date of Service: 06/09/2018 Medical Record Number: [id number] Patient Account Number: [id number] Date of Birth/Sex: 1926-03-28 (85 y.o. Female) Treating RN: Harold Barban Primary Care Provider: Thereasa Distance Other Clinician: Referring Provider: Thereasa Distance Treating Provider/Extender: Melburn Hake, HOYT Weeks in Treatment: 1 Diagnosis Coding ICD-10 Codes Code Description I73.89 Other specified peripheral vascular diseases L97.818 Non-pressure chronic ulcer of other part of right lower leg with other specified severity N18.3 Chronic kidney disease, stage 3 (moderate) I48.20 Chronic atrial fibrillation, unspecified Facility Procedures CPT4 Code: [id number] Description: 98921 - WOUND CARE VISIT-LEV 2 EST PT Modifier: Quantity: 1 Physician Procedures CPT4 Code Description: [id number] 99214 - WC PHYS LEVEL 4 - EST PT ICD-10 Diagnosis Description I73.89 Other specified peripheral vascular diseases L97.818 Non-pressure chronic ulcer of other part of  right lower leg wit  N18.3 Chronic kidney disease, stage 3  (moderate) I48.20 Chronic atrial fibrillation, unspecified Modifier: h other specified Quantity: 1 severity Electronic Signature(s) Signed: 06/09/2018 5:19:05 PM By: Worthy Keeler PA-C Entered By: Worthy Keeler on 06/09/2018 16:00:28

## 2018-06-11 NOTE — Progress Notes (Signed)
[name], [name] ([id number]) Visit Report for 06/09/2018 Arrival Information Details Patient Name: [name], [name] Date of Service: 06/09/2018 3:00 PM Medical Record Number: [id number] Patient Account Number: [id number] Date of Birth/Sex: December 13, 1925 (85 y.o. Female) Treating RN: [name] Primary Care Sinthia Karabin: Thereasa Distance Other Clinician: Referring Levon Penning: Thereasa Distance Treating Jacaden Forbush/Extender: [name], [name] Weeks in Treatment: 1 Visit Information History Since Last Visit Added or deleted any medications: No Patient Arrived: Wheel Chair Any new allergies or adverse reactions: No Arrival Time: 15:19 Had a fall or experienced change in No Accompanied By: daughter activities of daily living that may affect Transfer Assistance: Manual risk of falls: Patient Identification Verified: Yes Signs or symptoms of abuse/neglect since last visito No Secondary Verification Process Completed: Yes Hospitalized since last visit: No Patient Has Alerts: Yes Implantable device outside of the clinic excluding No Patient Alerts: DMII cellular tissue based products placed in the center since last visit: Has Dressing in Place as Prescribed: Yes Pain Present Now: Yes Electronic Signature(s) Signed: 06/09/2018 4:16:49 PM By: Lorine Bears RCP, RRT, CHT Entered By: Lorine Bears on 06/09/2018 15:20:54 Galbreath, [name] ([id number]) -------------------------------------------------------------------------------- Clinic Level of Care Assessment Details Patient Name: [name] Date of Service: 06/09/2018 3:00 PM Medical Record Number: [id number] Patient Account Number: [id number] Date of Birth/Sex: 06/19/1925 (85 y.o. Female) Treating RN: [name] Primary Care Kehinde Bowdish: Thereasa Distance Other Clinician: Referring Abdulkarim Eberlin: Thereasa Distance Treating [name]/Extender: [name], [name] Weeks in Treatment: 1 Clinic Level of Care Assessment Items TOOL 4  Quantity Score []  - Use when only an EandM is performed on FOLLOW-UP visit 0 ASSESSMENTS - Nursing Assessment / Reassessment X - Reassessment of Co-morbidities (includes updates in patient status) 1 10 X- 1 5 Reassessment of Adherence to Treatment Plan ASSESSMENTS - Wound and Skin Assessment / Reassessment X - Simple Wound Assessment / Reassessment - one wound 1 5 []  - 0 Complex Wound Assessment / Reassessment - multiple wounds []  - 0 Dermatologic / Skin Assessment (not related to wound area) ASSESSMENTS - Focused Assessment []  - Circumferential Edema Measurements - multi extremities 0 []  - 0 Nutritional Assessment / Counseling / Intervention []  - 0 Lower Extremity Assessment (monofilament, tuning fork, pulses) []  - 0 Peripheral Arterial Disease Assessment (using hand held doppler) ASSESSMENTS - Ostomy and/or Continence Assessment and Care []  - Incontinence Assessment and Management 0 []  - 0 Ostomy Care Assessment and Management (repouching, etc.) PROCESS - Coordination of Care X - Simple Patient / Family Education for ongoing care 1 15 []  - 0 Complex (extensive) Patient / Family Education for ongoing care []  - 0 Staff obtains Programmer, systems, Records, Test Results / Process Orders []  - 0 Staff telephones HHA, Nursing Homes / Clarify orders / etc []  - 0 Routine Transfer to another Facility (non-emergent condition) []  - 0 Routine Hospital Admission (non-emergent condition) []  - 0 New Admissions / Biomedical engineer / Ordering NPWT, Apligraf, etc. []  - 0 Emergency Hospital Admission (emergent condition) X- 1 10 Simple Discharge Coordination Aspinall, Aleah J. ([id number]) []  - 0 Complex (extensive) Discharge Coordination PROCESS - Special Needs []  - Pediatric / Minor Patient Management 0 []  - 0 Isolation Patient Management []  - 0 Hearing / Language / Visual special needs []  - 0 Assessment of Community assistance (transportation, D/C planning, etc.) []  - 0 Additional  assistance / Altered mentation []  - 0 Support Surface(s) Assessment (bed, cushion, seat, etc.) INTERVENTIONS - Wound Cleansing / Measurement X - Simple Wound Cleansing - one wound 1 5 []  -  0 Complex Wound Cleansing - multiple wounds X- 1 5 Wound Imaging (photographs - any number of wounds) []  - 0 Wound Tracing (instead of photographs) X- 1 5 Simple Wound Measurement - one wound []  - 0 Complex Wound Measurement - multiple wounds INTERVENTIONS - Wound Dressings X - Small Wound Dressing one or multiple wounds 1 10 []  - 0 Medium Wound Dressing one or multiple wounds []  - 0 Large Wound Dressing one or multiple wounds []  - 0 Application of Medications - topical []  - 0 Application of Medications - injection INTERVENTIONS - Miscellaneous []  - External ear exam 0 []  - 0 Specimen Collection (cultures, biopsies, blood, body fluids, etc.) []  - 0 Specimen(s) / Culture(s) sent or taken to Lab for analysis []  - 0 Patient Transfer (multiple staff / Civil Service fast streamer / Similar devices) []  - 0 Simple Staple / Suture removal (25 or less) []  - 0 Complex Staple / Suture removal (26 or more) []  - 0 Hypo / Hyperglycemic Management (close monitor of Blood Glucose) []  - 0 Ankle / Brachial Index (ABI) - do not check if billed separately X- 1 5 Vital Signs Mcculley, Camylle J. ([id number]) Has the patient been seen at the hospital within the last three years: Yes Total Score: 75 Level Of Care: New/Established - Level 2 Electronic Signature(s) Signed: 06/09/2018 5:19:20 PM By: [name] Entered By: [name] on 06/09/2018 15:58:03 Jenniges, [name] ([id number]) -------------------------------------------------------------------------------- Encounter Discharge Information Details Patient Name: [name] Date of Service: 06/09/2018 3:00 PM Medical Record Number: [id number] Patient Account Number: [id number] Date of Birth/Sex: 1926/02/23 (85 y.o. Female) Treating RN: [name] Primary  Care Derita Michelsen: Thereasa Distance Other Clinician: Referring Shakeena Kafer: Thereasa Distance Treating Lucciana Head/Extender: [name], [name] Weeks in Treatment: 1 Encounter Discharge Information Items Discharge Condition: Stable Ambulatory Status: Wheelchair Discharge Destination: Home Transportation: Private Auto Accompanied By: daughter Schedule Follow-up Appointment: Yes Clinical Summary of Care: Electronic Signature(s) Signed: 06/09/2018 5:19:20 PM By: [name] Entered By: [name] on 06/09/2018 15:58:52 Ragone, [name] ([id number]) -------------------------------------------------------------------------------- Lower Extremity Assessment Details Patient Name: [name] Date of Service: 06/09/2018 3:00 PM Medical Record Number: [id number] Patient Account Number: [id number] Date of Birth/Sex: [birth year] (85 y.o. Female) Treating RN: Secundino Ginger Primary Care Jendaya Gossett: Thereasa Distance Other Clinician: Referring Ardell Aaronson: Thereasa Distance Treating Mihail Prettyman/Extender: [name], [name] Weeks in Treatment: 1 Edema Assessment Assessed: [Left: No] [Right: No] Edema: [Left: N] [Right: o] Calf Left: Right: Point of Measurement: 31 cm From Medial Instep cm 25 cm Ankle Left: Right: Point of Measurement: 12 cm From Medial Instep cm 18 cm Vascular Assessment Claudication: Claudication Assessment [Right:None] Pulses: Dorsalis Pedis Palpable: [Right:Yes] Posterior Tibial Extremity colors, hair growth, and conditions: Extremity Color: [Right:Normal] Hair Growth on Extremity: [Right:No] Temperature of Extremity: [Right:Warm] Capillary Refill: [Right:> 3 seconds] Toe Nail Assessment Left: Right: Thick: Yes Discolored: Yes Deformed: Yes Improper Length and Hygiene: Yes Electronic Signature(s) Signed: 06/09/2018 4:21:56 PM By: Secundino Ginger Entered By: Secundino Ginger on 06/09/2018 15:37:13 [place]bury, [name]  ([id number]) -------------------------------------------------------------------------------- Multi Wound Chart Details Patient Name: [name] Date of Service: 06/09/2018 3:00 PM Medical Record Number: [id number] Patient Account Number: [id number] Date of Birth/Sex: 03/09/1926 (85 y.o. Female) Treating RN: [name] Primary Care Jerrica Thorman: Thereasa Distance Other Clinician: Referring Francine Hannan: Thereasa Distance Treating Hind Chesler/Extender: [name], [name] Weeks in Treatment: 1 Vital Signs Height(in): 63 Pulse(bpm): 51 Weight(lbs): 102 Blood Pressure(mmHg): 134/60 Body Mass Index(BMI): 18 Temperature(F): 97.9 Respiratory Rate 16 (breaths/min): Photos: [1:No Photos] [N/A:N/A] Wound Location: [1:Right Achilles] [N/A:N/A] Wounding  Event: [1:Gradually Appeared] [N/A:N/A] Primary Etiology: [1:Arterial Insufficiency Ulcer] [N/A:N/A] Secondary Etiology: [1:Diabetic Wound/Ulcer of the Lower Extremity] [N/A:N/A] Comorbid History: [1:Anemia, Arrhythmia, Congestive Heart Failure, Hypertension, Type II Diabetes, End Stage Renal Disease, Osteoarthritis] [N/A:N/A] Date Acquired: [1:04/01/2018] [N/A:N/A] Weeks of Treatment: [1:1] [N/A:N/A] Wound Status: [1:Open] [N/A:N/A] Pending Amputation on [1:Yes] [N/A:N/A] Presentation: Measurements L x W x D [1:3.2x1.9x0.1] [N/A:N/A] (cm) Area (cm) : [1:4.775] [N/A:N/A] Volume (cm) : [1:0.478] [N/A:N/A] % Reduction in Area: [1:0.00%] [N/A:N/A] % Reduction in Volume: [1:0.00%] [N/A:N/A] Classification: [1:Unclassifiable] [N/A:N/A] Exudate Amount: [1:Medium] [N/A:N/A] Exudate Type: [1:Serous] [N/A:N/A] Exudate Color: [1:amber] [N/A:N/A] Wound Margin: [1:Flat and Intact] [N/A:N/A] Granulation Amount: [1:None Present (0%)] [N/A:N/A] Necrotic Amount: [1:Small (1-33%)] [N/A:N/A] Necrotic Tissue: [1:Eschar, Adherent Slough] [N/A:N/A] Exposed Structures: [1:Fat Layer (Subcutaneous Tissue) Exposed: Yes Fascia: No Tendon: No Muscle: No Joint: No  Bone: No] [N/A:N/A] Epithelialization: None N/A N/A Periwound Skin Texture: Excoriation: No N/A N/A Induration: No Callus: No Crepitus: No Rash: No Scarring: No Periwound Skin Moisture: Maceration: No N/A N/A Dry/Scaly: No Periwound Skin Color: Erythema: Yes N/A N/A Atrophie Blanche: No Cyanosis: No Ecchymosis: No Hemosiderin Staining: No Mottled: No Pallor: No Rubor: No Erythema Location: Circumferential N/A N/A Temperature: No Abnormality N/A N/A Tenderness on Palpation: Yes N/A N/A Wound Preparation: Ulcer Cleansing: N/A N/A Rinsed/Irrigated with Saline Topical Anesthetic Applied: Other: lidocaine 4% Treatment Notes Electronic Signature(s) Signed: 06/09/2018 5:19:20 PM By: [name] Entered By: [name] on 06/09/2018 15:55:49 Lecy, [name] ([id number]) -------------------------------------------------------------------------------- Multi-Disciplinary Care Plan Details Patient Name: [name] Date of Service: 06/09/2018 3:00 PM Medical Record Number: [id number] Patient Account Number: [id number] Date of Birth/Sex: 10-Apr-1926 (85 y.o. Female) Treating RN: [name] Primary Care Adesuwa Osgood: Thereasa Distance Other Clinician: Referring Naveyah Iacovelli: Thereasa Distance Treating Ashiyah Pavlak/Extender: [name], [name] Weeks in Treatment: 1 Active Inactive Wound/Skin Impairment Nursing Diagnoses: Impaired tissue integrity Knowledge deficit related to ulceration/compromised skin integrity Goals: Ulcer/skin breakdown will have a volume reduction of 30% by week 4 Date Initiated: 06/02/2018 Target Resolution Date: 07/01/2018 Goal Status: Active Interventions: Assess patient/caregiver ability to obtain necessary supplies Assess patient/caregiver ability to perform ulcer/skin care regimen upon admission and as needed Assess ulceration(s) every visit Notes: Electronic Signature(s) Signed: 06/09/2018 5:19:20 PM By: [name] Entered By: [name] on  06/09/2018 15:55:30 Donalson, [name] ([id number]) -------------------------------------------------------------------------------- Pain Assessment Details Patient Name: [name] Date of Service: 06/09/2018 3:00 PM Medical Record Number: [id number] Patient Account Number: [id number] Date of Birth/Sex: 06/18/1925 (85 y.o. Female) Treating RN: [name] Primary Care Nezzie Manera: Thereasa Distance Other Clinician: Referring Jezlyn Westerfield: Thereasa Distance Treating Evian Derringer/Extender: [name], [name] Weeks in Treatment: 1 Active Problems Location of Pain Severity and Description of Pain Patient Has Paino Yes Site Locations Rate the pain. Current Pain Level: 8 Pain Management and Medication Current Pain Management: Electronic Signature(s) Signed: 06/09/2018 4:16:49 PM By: Lorine Bears RCP, RRT, CHT Signed: 06/09/2018 5:19:20 PM By: [name] Entered By: Lorine Bears on 06/09/2018 15:21:07 Ruesch, [name] ([id number]) -------------------------------------------------------------------------------- Patient/Caregiver Education Details Patient Name: [name] Date of Service: 06/09/2018 3:00 PM Medical Record Number: [id number] Patient Account Number: [id number] Date of Birth/Gender: 12/19/1925 (85 y.o. Female) Treating RN: [name] Primary Care Physician: Thereasa Distance Other Clinician: Referring Physician: Thereasa Distance Treating Physician/Extender: Sharalyn Ink in Treatment: 1 Education Assessment Education Provided To: Patient Education Topics Provided Wound/Skin Impairment: Handouts: Caring for Your Ulcer Methods: Demonstration, Explain/Verbal Responses: State content correctly Electronic Signature(s) Signed: 06/09/2018 5:19:20 PM By: [name] Entered By: [name] on 06/09/2018 15:59:00  [name], [name] ([id number]) -------------------------------------------------------------------------------- Wound  Assessment Details Patient Name: [name], [name] Date of Service: 06/09/2018 3:00 PM Medical Record Number: [id number] Patient Account Number: [id number] Date of Birth/Sex: [birth year] (85 y.o. 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 Wounding Event: Gradually Appeared Etiology: Date Acquired: 04/01/2018 Wound Open Weeks Of Treatment: 1 Status: Clustered Wound: No Comorbid Anemia, Arrhythmia, Congestive Heart Failure, Pending Amputation On Presentation History: Hypertension, Type II Diabetes, End Stage Renal Disease, Osteoarthritis Photos Photo Uploaded By: Secundino Ginger on 06/09/2018 16:18:25 Wound Measurements Length: (cm) 3.2 Width: (cm) 1.9 Depth: (cm) 0.1 Area: (cm) 4.775 Volume: (cm) 0.478 % Reduction in Area: 0% % Reduction in Volume: 0% Epithelialization: None Tunneling: No Undermining: No Wound Description Classification: Unclassifiable Foul Odor A Wound Margin: Flat and Intact Slough/Fibr Exudate Amount: Medium Exudate Type: Serous Exudate Color: amber fter Cleansing: No ino Yes Wound Bed Granulation Amount: None Present (0%) Exposed Structure Necrotic Amount: Small (1-33%) Fascia Exposed: No Necrotic Quality: Eschar, Adherent Slough Fat Layer (Subcutaneous Tissue) Exposed: Yes Tendon Exposed: No Muscle Exposed: No Joint Exposed: No Bone Exposed: No Ishee, Letta J. ([id number]) Periwound Skin Texture Texture Color No Abnormalities Noted: No No Abnormalities Noted: No Callus: No Atrophie Blanche: No Crepitus: No Cyanosis: No Excoriation: No Ecchymosis: No Induration: No Erythema: Yes Rash: No Erythema Location: Circumferential Scarring:  No Hemosiderin Staining: No Mottled: No Moisture Pallor: No No Abnormalities Noted: No Rubor: No Dry / Scaly: No Maceration: No Temperature / Pain Temperature: No Abnormality Tenderness on Palpation: Yes Wound Preparation Ulcer Cleansing: Rinsed/Irrigated with Saline Topical Anesthetic Applied: Other: lidocaine 4%, Treatment Notes Wound #1 (Right Achilles) Notes santyl, BFD Electronic Signature(s) Signed: 06/09/2018 4:21:56 PM By: Secundino Ginger Entered By: Secundino Ginger on 06/09/2018 15:33:50 Breden, [name] ([id number]) -------------------------------------------------------------------------------- [place] Details Patient Name: [name] Date of Service: 06/09/2018 3:00 PM Medical Record Number: [id number] Patient Account Number: [id number] Date of Birth/Sex: 1925-10-03 (85 y.o. Female) Treating RN: [name] Primary Care Nikolay Demetriou: Thereasa Distance Other Clinician: Referring Donterrius Santucci: Thereasa Distance Treating Leimomi Zervas/Extender: [name], [name] Weeks in Treatment: 1 Vital Signs Time Taken: 15:21 Temperature (F): 97.9 Height (in): 63 Pulse (bpm): 66 Weight (lbs): 102 Respiratory Rate (breaths/min): 16 Body Mass Index (BMI): 18.1 Blood Pressure (mmHg): 134/60 Reference Range: 80 - 120 mg / dl Electronic Signature(s) Signed: 06/09/2018 4:16:49 PM By: Lorine Bears RCP, RRT, CHT Entered By: Lorine Bears on 06/09/2018 15:23:37

## 2018-06-12 MED ORDER — DEXTROSE 5 % IV SOLN
2.0000 g | Freq: Once | INTRAVENOUS | Status: DC
  Filled 2018-06-12: qty 20

## 2018-06-13 ENCOUNTER — Other Ambulatory Visit: Payer: Self-pay

## 2018-06-13 ENCOUNTER — Telehealth (INDEPENDENT_AMBULATORY_CARE_PROVIDER_SITE_OTHER): Payer: Self-pay | Admitting: Vascular Surgery

## 2018-06-13 ENCOUNTER — Encounter
Admission: RE | Disposition: A | Discharge status: Home / Self Care | Payer: Self-pay | Source: Home / Self Care | Attending: Vascular Surgery

## 2018-06-13 ENCOUNTER — Ambulatory Visit
Admission: RE | Admit: 2018-06-13 | Discharge: 2018-06-13 | Disposition: A | Discharge status: Home / Self Care | Payer: Medicare Other | Attending: Vascular Surgery | Admitting: Vascular Surgery

## 2018-06-13 DIAGNOSIS — I4891 Unspecified atrial fibrillation: Secondary | ICD-10-CM | POA: Insufficient documentation

## 2018-06-13 DIAGNOSIS — I70299 Other atherosclerosis of native arteries of extremities, unspecified extremity: Secondary | ICD-10-CM

## 2018-06-13 DIAGNOSIS — I70223 Atherosclerosis of native arteries of extremities with rest pain, bilateral legs: Secondary | ICD-10-CM | POA: Diagnosis not present

## 2018-06-13 DIAGNOSIS — I739 Peripheral vascular disease, unspecified: Secondary | ICD-10-CM | POA: Diagnosis not present

## 2018-06-13 DIAGNOSIS — E782 Mixed hyperlipidemia: Secondary | ICD-10-CM | POA: Diagnosis not present

## 2018-06-13 DIAGNOSIS — Z794 Long term (current) use of insulin: Secondary | ICD-10-CM | POA: Diagnosis not present

## 2018-06-13 DIAGNOSIS — I509 Heart failure, unspecified: Secondary | ICD-10-CM | POA: Insufficient documentation

## 2018-06-13 DIAGNOSIS — Z882 Allergy status to sulfonamides status: Secondary | ICD-10-CM | POA: Diagnosis not present

## 2018-06-13 DIAGNOSIS — Z79899 Other long term (current) drug therapy: Secondary | ICD-10-CM

## 2018-06-13 DIAGNOSIS — E1151 Type 2 diabetes mellitus with diabetic peripheral angiopathy without gangrene: Secondary | ICD-10-CM | POA: Diagnosis not present

## 2018-06-13 DIAGNOSIS — I70221 Atherosclerosis of native arteries of extremities with rest pain, right leg: Secondary | ICD-10-CM | POA: Diagnosis not present

## 2018-06-13 DIAGNOSIS — I110 Hypertensive heart disease with heart failure: Secondary | ICD-10-CM | POA: Diagnosis not present

## 2018-06-13 DIAGNOSIS — I10 Essential (primary) hypertension: Secondary | ICD-10-CM

## 2018-06-13 DIAGNOSIS — L97909 Non-pressure chronic ulcer of unspecified part of unspecified lower leg with unspecified severity: Secondary | ICD-10-CM

## 2018-06-13 HISTORY — PX: LOWER EXTREMITY ANGIOGRAPHY: CATH118251

## 2018-06-13 LAB — CREATININE, SERUM
Creatinine, Ser: 0.91 mg/dL (ref 0.44–1.00)
GFR calc Af Amer: >60 mL/min (ref >60)
GFR calc non Af Amer: 56 mL/min — ABNORMAL LOW (ref >60)

## 2018-06-13 LAB — GLUCOSE, CAPILLARY
Glucose-Capillary: 82 mg/dL (ref 70–99)
Glucose-Capillary: 134 mg/dL — ABNORMAL HIGH (ref 70–99)

## 2018-06-13 LAB — BUN: BUN: 20 mg/dL (ref 8–23)

## 2018-06-13 SURGERY — LOWER EXTREMITY ANGIOGRAPHY
Anesthesia: Moderate Sedation | Site: Leg Lower | Laterality: Right

## 2018-06-13 MED ORDER — LABETALOL HCL 5 MG/ML IV SOLN
10.0000 mg | Freq: Once | INTRAVENOUS | Status: AC
  Administered 2018-06-13: 10 mg via INTRAVENOUS

## 2018-06-13 MED ORDER — CLOPIDOGREL BISULFATE 75 MG PO TABS
150.0000 mg | ORAL_TABLET | ORAL | Status: AC
  Administered 2018-06-13: 150 mg via ORAL

## 2018-06-13 MED ORDER — FENTANYL CITRATE (PF) 100 MCG/2ML IJ SOLN
INTRAMUSCULAR | Status: AC
  Filled 2018-06-13: qty 2

## 2018-06-13 MED ORDER — CLOPIDOGREL BISULFATE 75 MG PO TABS
ORAL_TABLET | ORAL | Status: AC
  Administered 2018-06-13: 150 mg via ORAL
  Filled 2018-06-13: qty 2

## 2018-06-13 MED ORDER — CLOPIDOGREL BISULFATE 75 MG PO TABS: 75.0000 mg | ORAL_TABLET | Freq: Every day | ORAL | 11 refills | Status: DC

## 2018-06-13 MED ORDER — LABETALOL HCL 5 MG/ML IV SOLN
INTRAVENOUS | Status: AC
  Filled 2018-06-13: qty 4

## 2018-06-13 MED ORDER — HEPARIN SODIUM (PORCINE) 1000 UNIT/ML IJ SOLN
INTRAMUSCULAR | Status: AC
  Filled 2018-06-13: qty 1

## 2018-06-13 MED ORDER — HEPARIN (PORCINE) IN NACL 1000-0.9 UT/500ML-% IV SOLN
INTRAVENOUS | Status: AC
  Filled 2018-06-13: qty 500

## 2018-06-13 MED ORDER — NITROGLYCERIN 1 MG/10 ML FOR IR/CATH LAB
INTRA_ARTERIAL | Status: DC | PRN
  Administered 2018-06-13: 250 ug
  Administered 2018-06-13: 500 ug

## 2018-06-13 MED ORDER — HYDROMORPHONE HCL 1 MG/ML IJ SOLN
INTRAMUSCULAR | Status: AC
  Filled 2018-06-13: qty 0.5

## 2018-06-13 MED ORDER — ONDANSETRON HCL 4 MG/2ML IJ SOLN
INTRAMUSCULAR | Status: AC
  Administered 2018-06-13: 4 mg via INTRAVENOUS
  Filled 2018-06-13: qty 2

## 2018-06-13 MED ORDER — HEPARIN (PORCINE) IN NACL 1000-0.9 UT/500ML-% IV SOLN
INTRAVENOUS | Status: AC
  Filled 2018-06-13: qty 1000

## 2018-06-13 MED ORDER — DIPHENHYDRAMINE HCL 50 MG/ML IJ SOLN: 50.0000 mg | Freq: Once | INTRAMUSCULAR | Status: DC | PRN

## 2018-06-13 MED ORDER — LABETALOL HCL 5 MG/ML IV SOLN
INTRAVENOUS | Status: DC | PRN
  Administered 2018-06-13: 10 mg via INTRAVENOUS

## 2018-06-13 MED ORDER — FAMOTIDINE 20 MG PO TABS: 40.0000 mg | ORAL_TABLET | Freq: Once | ORAL | Status: DC | PRN

## 2018-06-13 MED ORDER — SODIUM CHLORIDE 0.9 % IV SOLN
INTRAVENOUS | Status: DC
  Administered 2018-06-13: 14:00:00 via INTRAVENOUS

## 2018-06-13 MED ORDER — IOHEXOL 300 MG/ML  SOLN
INTRAMUSCULAR | Status: DC | PRN
  Administered 2018-06-13: 125 mL via INTRAVENOUS

## 2018-06-13 MED ORDER — LIDOCAINE HCL (PF) 1 % IJ SOLN
INTRAMUSCULAR | Status: AC
  Filled 2018-06-13: qty 30

## 2018-06-13 MED ORDER — SODIUM CHLORIDE 0.9% FLUSH: 3.0000 mL | INTRAVENOUS | Status: DC | PRN

## 2018-06-13 MED ORDER — SODIUM CHLORIDE (PF) 0.9 % IJ SOLN
INTRAMUSCULAR | Status: AC
  Filled 2018-06-13: qty 50

## 2018-06-13 MED ORDER — ONDANSETRON HCL 4 MG/2ML IJ SOLN
4.0000 mg | Freq: Four times a day (QID) | INTRAMUSCULAR | Status: DC | PRN
  Administered 2018-06-13: 4 mg via INTRAVENOUS

## 2018-06-13 MED ORDER — HEPARIN SODIUM (PORCINE) 1000 UNIT/ML IJ SOLN
INTRAMUSCULAR | Status: DC | PRN
  Administered 2018-06-13: 1000 [IU] via INTRAVENOUS
  Administered 2018-06-13: 4000 [IU] via INTRAVENOUS

## 2018-06-13 MED ORDER — MIDAZOLAM HCL 2 MG/2ML IJ SOLN
INTRAMUSCULAR | Status: AC
  Filled 2018-06-13: qty 2

## 2018-06-13 MED ORDER — SODIUM CHLORIDE 0.9% FLUSH: 3.0000 mL | Freq: Two times a day (BID) | INTRAVENOUS | Status: DC

## 2018-06-13 MED ORDER — METHYLPREDNISOLONE SODIUM SUCC 125 MG IJ SOLR: 125.0000 mg | Freq: Once | INTRAMUSCULAR | Status: DC | PRN

## 2018-06-13 MED ORDER — LABETALOL HCL 5 MG/ML IV SOLN: 10.0000 mg | INTRAVENOUS | Status: DC | PRN

## 2018-06-13 MED ORDER — HYDRALAZINE HCL 20 MG/ML IJ SOLN: 5.0000 mg | INTRAMUSCULAR | Status: DC | PRN

## 2018-06-13 MED ORDER — MIDAZOLAM HCL 2 MG/2ML IJ SOLN
INTRAMUSCULAR | Status: DC | PRN
  Administered 2018-06-13: 1 mg via INTRAVENOUS

## 2018-06-13 MED ORDER — MORPHINE SULFATE (PF) 4 MG/ML IV SOLN: 2.0000 mg | INTRAVENOUS | Status: DC | PRN

## 2018-06-13 MED ORDER — SODIUM CHLORIDE 0.9 % IV SOLN
INTRAVENOUS | Status: DC
  Administered 2018-06-13: 10:00:00 via INTRAVENOUS

## 2018-06-13 MED ORDER — ONDANSETRON HCL 4 MG/2ML IJ SOLN: 4.0000 mg | Freq: Four times a day (QID) | INTRAMUSCULAR | Status: DC | PRN

## 2018-06-13 MED ORDER — FENTANYL CITRATE (PF) 100 MCG/2ML IJ SOLN
INTRAMUSCULAR | Status: DC | PRN
  Administered 2018-06-13: 12.5 ug via INTRAVENOUS
  Administered 2018-06-13: 25 ug via INTRAVENOUS

## 2018-06-13 MED ORDER — OXYCODONE HCL 5 MG PO TABS: 5.0000 mg | ORAL_TABLET | ORAL | Status: DC | PRN

## 2018-06-13 MED ORDER — SODIUM CHLORIDE 0.9 % IV SOLN: 250.0000 mL | INTRAVENOUS | Status: DC | PRN

## 2018-06-13 MED ORDER — ACETAMINOPHEN 325 MG PO TABS: 650.0000 mg | ORAL_TABLET | ORAL | Status: DC | PRN

## 2018-06-13 MED ORDER — NITROGLYCERIN 5 MG/ML IV SOLN
INTRAVENOUS | Status: AC
  Filled 2018-06-13: qty 10

## 2018-06-13 MED ORDER — MIDAZOLAM HCL 2 MG/ML PO SYRP: 8.0000 mg | ORAL_SOLUTION | Freq: Once | ORAL | Status: DC | PRN

## 2018-06-13 MED ORDER — HYDROMORPHONE HCL 1 MG/ML IJ SOLN
1.0000 mg | Freq: Once | INTRAMUSCULAR | Status: AC | PRN
  Administered 2018-06-13: 1 mg via INTRAVENOUS

## 2018-06-13 SURGICAL SUPPLY — 37 items
BALLN LUTONIX 018 5X100X130 (BALLOONS) ×3
BALLN LUTONIX 018 5X220X130 (BALLOONS) ×3
BALLN LUTONIX 018 5X80X130 (BALLOONS) ×3
BALLN LUTONIX 018 6X150X130 (BALLOONS) ×3
BALLN ULTRASCORE 014 3X40X150 (BALLOONS) ×3
BALLN ULTRASCORE 014 4X200X150 (BALLOONS) ×3
BALLOON LUTONIX 018 5X100X130 (BALLOONS) IMPLANT
BALLOON LUTONIX 018 5X220X130 (BALLOONS) IMPLANT
BALLOON LUTONIX 018 5X80X130 (BALLOONS) IMPLANT
BALLOON LUTONIX 018 6X150X130 (BALLOONS) IMPLANT
BALLOON ULTRSCRE 014 3X40X150 (BALLOONS) IMPLANT
BALLOON ULTRSCRE 014 4X200X150 (BALLOONS) IMPLANT
CATH CROSSER 14S OTW 146CM (CATHETERS) ×2 IMPLANT
CATH PIG 70CM (CATHETERS) ×2 IMPLANT
CATH SIDEKICK XL ST 110CM (SHEATH) ×2 IMPLANT
CATH VERT 5FR 125CM (CATHETERS) ×2 IMPLANT
DEVICE PRESTO INFLATION (MISCELLANEOUS) ×2 IMPLANT
DEVICE STARCLOSE SE CLOSURE (Vascular Products) ×2 IMPLANT
DEVICE TORQUE .025-.038 (MISCELLANEOUS) ×2 IMPLANT
GLIDEWIRE ADV .035X260CM (WIRE) ×2 IMPLANT
IV NS 250ML (IV SOLUTION) ×2
IV NS 250ML BAXH (IV SOLUTION) IMPLANT
KIT FLOWMATE PROCEDURAL (KITS) ×2 IMPLANT
NDL ENTRY 21GA 7CM ECHOTIP (NEEDLE) IMPLANT
NEEDLE ENTRY 21GA 7CM ECHOTIP (NEEDLE) ×3 IMPLANT
PACK ANGIOGRAPHY (CUSTOM PROCEDURE TRAY) ×3 IMPLANT
SET INTRO CAPELLA COAXIAL (SET/KITS/TRAYS/PACK) ×2 IMPLANT
SHEATH BRITE TIP 5FRX11 (SHEATH) ×2 IMPLANT
SHEATH FLEXOR ANSEL2 7FRX45 (SHEATH) ×2 IMPLANT
STENT LIFESTENT 5F 6X150X135 (Permanent Stent) ×2 IMPLANT
STENT LIFESTENT 5F 6X80X135 (Permanent Stent) ×2 IMPLANT
STENT LIFESTENT 6X170X130 (Permanent Stent) ×2 IMPLANT
SYR MEDRAD MARK 7 150ML (SYRINGE) ×2 IMPLANT
TUBING CONTRAST HIGH PRESS 72 (TUBING) ×2 IMPLANT
VALVE HEMO TOUHY BORST Y (ADAPTER) ×2 IMPLANT
WIRE J 3MM .035X145CM (WIRE) ×2 IMPLANT
WIRE SPARTACORE .014X300CM (WIRE) ×2 IMPLANT

## 2018-06-13 NOTE — Op Note (Signed)
[facility] Percutaneous Study/Intervention Procedural Note   Date of Surgery: 06/13/2018  Surgeon:  Katha Cabal, MD.  Pre-operative Diagnosis: Atherosclerotic occlusive disease bilateral lower extremities with increasing rest pain of the right lower extremity  Post-operative diagnosis: Same  Procedure(s) Performed: 1. Introduction catheter into right lower extremity 3rd order catheter placement  2. Contrast injection right lower extremity for distal runoff   3. Crosser atherectomy of the right SFA and popliteal arteries 4.  Percutaneous transluminal angioplasty and stent placement right superficial femoral artery and popliteal             5.  Percutaneous transluminal angioplasty to 3 mm right anterior tibial artery                         6.     Percutaneous transluminal angioplasty to 3 mm of the right peroneal artery               7.   Star close closure left common femoral arteriotomy  Anesthesia: Conscious sedation was administered by the radiology RN under my direct supervision. IV Versed plus fentanyl were utilized. Continuous ECG, pulse oximetry and blood pressure was monitored throughout the entire procedure. Conscious sedation was for a total of 126 minutes.  Sheath: 7 [place] Ansell left common femoral retrograde  Contrast: 125 cc  Fluoroscopy Time: 27.3 minutes  Indications: [name] presents with worsening right lower extremity pain.  [name] has known atherosclerotic occlusive disease and significant deterioration since her previous visit.  The risks and benefits are reviewed all questions answered patient agrees to proceed.  Procedure: [name] is a 85 y.o. y.o. female who was identified and appropriate procedural time out was performed. The patient was then placed supine on the table and prepped and draped in the usual sterile fashion.   Ultrasound was placed in the sterile  sleeve and the left groin was evaluated the left common femoral artery was echolucent and pulsatile indicating patency.  Image was recorded for the permanent record and under real-time visualization a microneedle was inserted into the common femoral artery microwire followed by a micro-sheath.  A J-wire was then advanced through the micro-sheath and a  5 [place] sheath was then inserted over a J-wire. J-wire was then advanced and a 5 French pigtail catheter was positioned at the level of T12. AP projection of the aorta was then obtained. Pigtail catheter was repositioned to above the bifurcation and a LAO view of the pelvis was obtained.  Subsequently a pigtail catheter with the stiff angle Glidewire was used to cross the aortic bifurcation the catheter wire were advanced down into the right distal external iliac artery. Oblique view of the femoral bifurcation was then obtained and subsequently the wire was reintroduced and the pigtail catheter negotiated into the profunda femoris representing third order catheter placement. Distal runoff was then performed.  Interpretation: The abdominal aorta is opacified with a bolus injection of contrast.  Is diffusely diseased but there are no hemodynamically significant lesions identified.  There are single renal arteries noted bilaterally.  Left renal artery appears to have a 60 to 70% stenosis in its midportion.  Bilateral nephrograms are normal size.  The bilateral common and external iliac arteries are patent without hemodynamically significant stenosis.  The right common femoral and profunda femoris are patent with diffuse disease but no hemodynamically significant lesions are identified.  The SFA is now occluded up to its origin.  There appears to be reconstitution of the mid to distal popliteal.  Trifurcation is diffusely diseased with a greater 70% stenosis at the origin of the anterior tibial and a greater than 90% stenosis at the origin of the peroneal.  The  posterior tibial is completely nonvisualized throughout its course.  The distal tibial vessels are poorly visualized secondary to limitations of contrast from more proximal injections.  Based on these findings I elected to proceed with intervention and revascularization.  5000 units of heparin was then given and allowed to circulate and a 7 [place] Ansell sheath was advanced up and over the bifurcation and positioned in the femoral artery  The 14 S Crosser catheter was then prepped on the field and a straight side kick catheter was advanced into the cul-de-sac of the SFA under magnified imaging in the LAO projection. Using the Crosser catheter the occlusion of the SFA and popliteal was negotiated.  Hand-injection of contrast through the side kick demonstrated the distal anatomy.  And an advantage wire and a vertebral catheter were then advanced down into the tibioperoneal trunk and then peroneal.    A 4 mm x 20 cm ultra score balloon was used to angioplasty the superficial femoral and popliteal arteries. Inflations were to 10-12 atmospheres for 1 minute a total of 3 inflations were required. Follow-up imaging demonstrated patency with multiple areas of greater than 60% residual stenosis both proximally in the midsection and throughout the entire length of the popliteal.  Given this finding and her previous intervention I elected to restent beginning at the origin of the SFA a 6 mm x 200 millimeters life stent was deployed this was subsequently postdilated with a 5 mm x 200 mm Lutonix drug-eluting balloon inflated to 10 atm for 1 minute.  Next a 6 mm x 170 mm life stent was deployed across Hunter's canal and this was postdilated with a 5 mm x 200 mm Lutonix drug-eluting balloon again inflated to 10 atm for 1 minute.  Secondary inflation across Hunter's canal was utilized with a 6 mm balloon inflated to 12 atm for 1 minute.  Lastly a 6 mm x 80 mm life stent was deployed extending down to the distal popliteal  and this was postdilated with a 5 mm x 80 mm Lutonix drug-eluting balloon inflated to 8 atm for 1 minute.  Follow-up imaging demonstrated less than 5% residual stenosis throughout the SFA popliteal stent.  Attention was then turned to the peroneal where the wire and catheter had already cross this lesion and a 3 mm x 40 mm ultra score balloon was inflated to 12 atm for 1 minute.  Follow-up imaging demonstrated less than 5% residual stenosis.  Next the wire was then negotiated into the anterior tibial and the catheter was advanced.  This represents additional third order catheter placement.  A 3 mm x 40 mm ultra score balloon was used to treat the greater than 80% stenosis at the ostia of the anterior tibial artery.  The vertebral catheter was then advanced over the 014 wire down to the distal popliteal where hand-injection contrast was used to demonstrate distal runoff.  Both angioplasty sites were widely patent with less than 5% residual stenosis.  The peroneal appears to occlude at the level of the ankle.  The anterior tibial is patent and crosses the foot and the dorsalis pedis.  Forefoot remains poorly visualized.  After review of these images the sheath is pulled into the left external iliac oblique of the common femoral is  obtained and a Star close device deployed. There no immediate Complications.  Findings:  The abdominal aorta is opacified with a bolus injection of contrast.  Is diffusely diseased but there are no hemodynamically significant lesions identified.  There are single renal arteries noted bilaterally.  Left renal artery appears to have a 60 to 70% stenosis in its midportion.  Bilateral nephrograms are normal size.  The bilateral common and external iliac arteries are patent without hemodynamically significant stenosis.  The right common femoral and profunda femoris are patent with diffuse disease but no hemodynamically significant lesions are identified.  The SFA is now occluded up  to its origin.  There appears to be reconstitution of the mid to distal popliteal.  Trifurcation is diffusely diseased with a greater 70% stenosis at the origin of the anterior tibial and a greater than 90% stenosis at the origin of the peroneal.  The posterior tibial is completely nonvisualized throughout its course.  The distal tibial vessels are poorly visualized secondary to limitations of contrast from more proximal injections.   Following crosser atherectomy there is successful recanalization but high-grade residual stenosis throughout the SFA and popliteal.  Following angioplasty and stent placement the SFA and popliteal are now widely patent with less than 5% residual stenosis.  Following angioplasty of the anterior tibial artery there is less than 5% residual stenosis at the origin and there now is in-line flow and looks quite nice. Angioplasty of the origin of the peroneal to 3 mm there is an excellent result with less than 5 % residual stenosis.  Now the there has been successful recanalization distal runoff is completed.  The peroneal appears to occlude at the level of the ankle.  The anterior tibial is patent and crosses the foot and the dorsalis pedis.  Forefoot remains poorly visualized.    Disposition: Patient was taken to the recovery room in stable condition having tolerated the procedure well.  [name] 06/13/2018,1:48 PM

## 2018-06-13 NOTE — Progress Notes (Signed)
Dr. Delana Meyer informed face to face that [name]'s right foot produced a PT pulse prior to procedure. PT pulse is unable to be found post procedure. Dr. Ronalee Belts observed the patient at the bedside and did not wish to make any changes or place any orders.

## 2018-06-13 NOTE — Discharge Instructions (Signed)
Moderate Conscious Sedation, Adult, Care After °These instructions provide you with information about caring for yourself after your procedure. Your health care provider may also give you more specific instructions. Your treatment has been planned according to current medical practices, but problems sometimes occur. Call your health care provider if you have any problems or questions after your procedure. °What can I expect after the procedure? °After your procedure, it is common: °· To feel sleepy for several hours. °· To feel clumsy and have poor balance for several hours. °· To have poor judgment for several hours. °· To vomit if you eat too soon. °Follow these instructions at home: °For at least 24 hours after the procedure: ° °· Do not: °? Participate in activities where you could fall or become injured. °? Drive. °? Use heavy machinery. °? Drink alcohol. °? Take sleeping pills or medicines that cause drowsiness. °? Make important decisions or sign legal documents. °? Take care of children on your own. °· Rest. °Eating and drinking °· Follow the diet recommended by your health care provider. °· If you vomit: °? Drink water, juice, or soup when you can drink without vomiting. °? Make sure you have little or no nausea before eating solid foods. °General instructions °· Have a responsible adult stay with you until you are awake and alert. °· Take over-the-counter and prescription medicines only as told by your health care provider. °· If you smoke, do not smoke without supervision. °· Keep all follow-up visits as told by your health care provider. This is important. °Contact a health care provider if: °· You keep feeling nauseous or you keep vomiting. °· You feel light-headed. °· You develop a rash. °· You have a fever. °Get help right away if: °· You have trouble breathing. °This information is not intended to replace advice given to you by your health care provider. Make sure you discuss any questions you have  with your health care provider. °Document Released: 01/17/2013 Document Revised: 09/01/2015 Document Reviewed: 07/19/2015 °Elsevier Interactive Patient Education © 2019 [employer] °Angiogram, Care After °This sheet gives you information about how to care for yourself after your procedure. Your doctor may also give you more specific instructions. If you have problems or questions, contact your doctor. °Follow these instructions at home: °Insertion site care °· Follow instructions from your doctor about how to take care of your long, thin tube (catheter) insertion area. Make sure you: °? Wash your hands with soap and water before you change your bandage (dressing). If you cannot use soap and water, use hand sanitizer. °? Change your bandage as told by your doctor. °? Leave stitches (sutures), skin glue, or skin tape (adhesive) strips in place. They may need to stay in place for 2 weeks or longer. If tape strips get loose and curl up, you may trim the loose edges. Do not remove tape strips completely unless your doctor says it is okay. °· Do not take baths, swim, or use a hot tub until your doctor says it is okay. °· You may shower 24-48 hours after the procedure or as told by your doctor. °? Gently wash the area with plain soap and water. °? Pat the area dry with a clean towel. °? Do not rub the area. This may cause bleeding. °· Do not apply powder or lotion to the area. Keep the area clean and dry. °· Check your insertion area every day for signs of infection. Check for: °? More redness, swelling, or pain. °?   Fluid or blood. °? Warmth. °? Pus or a bad smell. °Activity °· Rest as told by your doctor, usually for 1-2 days. °· Do not lift anything that is heavier than 10 lbs. (4.5 kg) or as told by your doctor. °· Do not drive for 24 hours if you were given a medicine to help you relax (sedative). °· Do not drive or use heavy machinery while taking prescription pain medicine. °General instructions ° °· Go back to  your normal activities as told by your doctor, usually in about a week. Ask your doctor what activities are safe for you. °· If the insertion area starts to bleed, lie flat and put pressure on the area. If the bleeding does not stop, get help right away. This is an emergency. °· Drink enough fluid to keep your pee (urine) clear or pale yellow. °· Take over-the-counter and prescription medicines only as told by your doctor. °· Keep all follow-up visits as told by your doctor. This is important. °Contact a doctor if: °· You have a fever. °· You have chills. °· You have more redness, swelling, or pain around your insertion area. °· You have fluid or blood coming from your insertion area. °· The insertion area feels warm to the touch. °· You have pus or a bad smell coming from your insertion area. °· You have more bruising around the insertion area. °· Blood collects in the tissue around the insertion area (hematoma) that may be painful to the touch. °Get help right away if: °· You have a lot of pain in the insertion area. °· The insertion area swells very fast. °· The insertion area is bleeding, and the bleeding does not stop after holding steady pressure on the area. °· The area near or just beyond the insertion area becomes pale, cool, tingly, or numb. °These symptoms may be an emergency. Do not wait to see if the symptoms will go away. Get medical help right away. Call your local emergency services (911 in the [place] Do not drive yourself to the hospital. °Summary °· After the procedure, it is common to have bruising and tenderness at the long, thin tube insertion area. °· After the procedure, it is important to rest and drink plenty of fluids. °· Do not take baths, swim, or use a hot tub until your doctor says it is okay to do so. You may shower 24-48 hours after the procedure or as told by your doctor. °· If the insertion area starts to bleed, lie flat and put pressure on the area. If the bleeding does not stop,  get help right away. This is an emergency. °This information is not intended to replace advice given to you by your health care provider. Make sure you discuss any questions you have with your health care provider. °Document Released: 06/25/2008 Document Revised: 03/23/2016 Document Reviewed: 03/23/2016 °Elsevier Interactive Patient Education © 2019 [employer] ° °

## 2018-06-13 NOTE — H&P (Addendum)
[facility] Admission History & Physical  MRN : [id number]  [name] is a 85 y.o. ([birth year]) female who presents with chief complaint of pain of my right leg.  History of Present Illness:   The patient presents to [facility] today for treatment secondary to increasing pain of her right lower extremity. There has been a significant deterioration in the lower extremity symptoms.  The patient notes interval shortening of their claudication distance and development of mild rest pain symptoms. No new ulcers or wounds have occurred since the last visit.  There have been no significant changes to the patient's overall health care.  The patient denies amaurosis fugax or recent TIA symptoms. There are no recent neurological changes noted. The patient denies history of DVT, PE or superficial thrombophlebitis. The patient denies recent episodes of angina or shortness of breath.   Previous ABI's Rt= 0.89 and Lt= 1.13 (previous ABI's Rt= 1.00 and Lt= 0.72) Previous duplex US of the lower extremity arterial system shows greater than 70% stenosis of the right SFA  Current Facility-Administered Medications  Medication Dose Route Frequency Provider Last Rate Last Dose  . labetalol (NORMODYNE,TRANDATE) 5 MG/ML injection           . 0.9 %  sodium chloride infusion   Intravenous Continuous Kris Hartmann, NP 75 mL/hr at 06/13/18 1013    . ceFAZolin (ANCEF) 2 g in dextrose 5 % 50 mL IVPB  2 g Intravenous Once Eulogio Ditch E, NP      . diphenhydrAMINE (BENADRYL) injection 50 mg  50 mg Intravenous Once PRN Kris Hartmann, NP      . famotidine (PEPCID) tablet 40 mg  40 mg Oral Once PRN Kris Hartmann, NP      . fentaNYL (SUBLIMAZE) 100 MCG/2ML injection           . Heparin (Porcine) in NaCl 1000-0.9 UT/500ML-% SOLN           . heparin 1000 UNIT/ML injection           . HYDROmorphone (DILAUDID) injection 1 mg  1 mg Intravenous Once PRN Eulogio Ditch E, NP      .  lidocaine (PF) (XYLOCAINE) 1 % injection           . methylPREDNISolone sodium succinate (SOLU-MEDROL) 125 mg/2 mL injection 125 mg  125 mg Intravenous Once PRN Kris Hartmann, NP      . midazolam (VERSED) 2 MG/2ML injection           . midazolam (VERSED) 2 MG/ML syrup 8 mg  8 mg Oral Once PRN Kris Hartmann, NP      . ondansetron [facility]) injection 4 mg  4 mg Intravenous Q6H PRN Kris Hartmann, NP        Past Medical History:  Diagnosis Date  . A-fib ([place])   . CHF (congestive heart failure) ([place])   . Diabetes mellitus without complication ([place])   . Hypertension     Past Surgical History:  Procedure Laterality Date  . CHOLECYSTECTOMY    . LOWER EXTREMITY ANGIOGRAPHY Right 08/12/2017   Procedure: LOWER EXTREMITY ANGIOGRAPHY;  Surgeon: Katha Cabal, MD;  Location: [facility];  Service: Cardiovascular;  Laterality: Right;    Social History Social History   Tobacco Use  . Smoking status: Never Smoker  . Smokeless tobacco: Never Used  Substance Use Topics  . Alcohol use: No    Frequency: Never  . Drug use: No  Family History Family History  Problem Relation Age of Onset  . Cancer Brother   No family history of bleeding/clotting disorders, porphyria or autoimmune disease   Allergies  Allergen Reactions  . Sulfa Antibiotics Hives     REVIEW OF SYSTEMS (Negative unless checked)  Constitutional: [] Weight loss  [] Fever  [] Chills Cardiac: [] Chest pain   [] Chest pressure   [] Palpitations   [] Shortness of breath when laying flat   [] Shortness of breath at rest   [] Shortness of breath with exertion. Vascular:  [x] Pain in legs with walking   [x] Pain in legs at rest   [] Pain in legs when laying flat   [] Claudication   [] Pain in feet when walking  [] Pain in feet at rest  [] Pain in feet when laying flat   [] History of DVT   [] Phlebitis   [] Swelling in legs   [] Varicose veins   [] Non-healing ulcers Pulmonary:   [] Uses home oxygen   [] Productive cough   [] Hemoptysis    [] Wheeze  [] COPD   [] Asthma Neurologic:  [] Dizziness  [] Blackouts   [] Seizures   [] History of stroke   [] History of TIA  [] Aphasia   [] Temporary blindness   [] Dysphagia   [] Weakness or numbness in arms   [] Weakness or numbness in legs Musculoskeletal:  [] Arthritis   [] Joint swelling   [] Joint pain   [] Low back pain Hematologic:  [] Easy bruising  [] Easy bleeding   [] Hypercoagulable state   [] Anemic  [] Hepatitis Gastrointestinal:  [] Blood in stool   [] Vomiting blood  [] Gastroesophageal reflux/heartburn   [] Difficulty swallowing. Genitourinary:  [] Chronic kidney disease   [] Difficult urination  [] Frequent urination  [] Burning with urination   [] Blood in urine Skin:  [] Rashes   [] Ulcers   [] Wounds Psychological:  [] History of anxiety   []  History of major depression.  Physical Examination  Vitals:   06/13/18 1001 06/13/18 1002  BP: (!) 160/107 (!) 201/86  Pulse: 84 80  Resp: (!) 24 (!) 27  Temp: (!) 97.5 F (36.4 C)   SpO2: 97% 96%  Weight: 56.2 kg   Height: 5\' 2"  (1.575 m)    Body mass index is 22.68 kg/m. Gen: WD/WN, NAD frail Head: [place]/AT, No temporalis wasting.  Ear/Nose/Throat: Hearing grossly intact, nares w/o erythema or drainage, oropharynx w/o Erythema/Exudate, Eyes: Sclera non-icteric, conjunctiva clear Neck: Supple, no nuchal rigidity.  No JVD.  Pulmonary:  Good air movement, no increased work of respiration or use of accessory muscles  Cardiac: RRR, normal S1, S2, no Murmurs, rubs or gallops. Vascular:  Vessel Right Left  Radial Palpable Palpable  PT Not Palpable Trace Palpable  DP Not Palpable Trace Palpable  Gastrointestinal: soft, non-tender/non-distended. No guarding/reflex. No masses, surgical incisions, or scars. Musculoskeletal: M/S 5/5 throughout.  No deformity or atrophy.  1+ edema bilaterally Neurologic: Sensation grossly intact in extremities.  Symmetrical.  Speech is fluent. Motor exam as listed above. Psychiatric: Judgment intact, Mood & affect appropriate  for pt's clinical situation. Dermatologic: No rashes or ulcers noted.  No cellulitis or open wounds. Lymph : No Cervical, Axillary, or Inguinal lymphadenopathy.      CBC Lab Results  Component Value Date   WBC 7.5 12/07/2017   HGB 15.1 12/07/2017   HCT 45.6 12/07/2017   MCV 96.0 12/07/2017   PLT 233 12/07/2017    BMET    Component Value Date/Time   NA 140 12/07/2017 1212   NA 141 08/03/2011 0729   K 3.7 12/07/2017 1212   K 3.6 08/03/2011 0729   CL 102 12/07/2017 1212  CL 105 08/03/2011 0729   CO2 26 12/07/2017 1212   CO2 26 08/03/2011 0729   GLUCOSE 98 12/07/2017 1212   GLUCOSE 149 (H) 08/03/2011 0729   BUN 20 06/13/2018 1008   BUN 16 08/03/2011 0729   CREATININE 0.91 06/13/2018 1008   CREATININE 1.07 08/03/2011 0729   CALCIUM 9.2 12/07/2017 1212   CALCIUM 9.5 08/03/2011 0729   GFRNONAA 56 (L) 06/13/2018 1008   GFRNONAA 48 (L) 08/03/2011 0729   GFRAA >60 06/13/2018 1008   GFRAA 56 (L) 08/03/2011 0729   Estimated Creatinine Clearance: 32.5 mL/min (by C-G formula based on SCr of 0.91 mg/dL).  COAG Lab Results  Component Value Date   INR 1.13 08/12/2017   INR 2.24 05/30/2017    Radiology Dg Ankle Complete Right  Result Date: 06/02/2018 CLINICAL DATA:  Open wound of the posterior right ankle for 1 month. EXAM: RIGHT ANKLE - COMPLETE 3+ VIEW COMPARISON:  None. FINDINGS: There is no evidence of fracture, dislocation, or joint effusion. Mild degenerative joint changes are identified of the midfoot. Soft tissues are unremarkable. IMPRESSION: No plain film evidence of osteomyelitis. Electronically Signed   By: Abelardo Diesel M.D.   On: 06/02/2018 16:04      Assessment/Plan: 1. PAD (peripheral artery disease) (HCC) Recommend:  The patient has not experienced increased symptoms but given the focal stenosis associated with the stents and the monophasic signals puts her right leg at high risk for failure and loss of her stents and return of her rest  pain.   Given the above findings associated with the patient's lower extremity symptoms the patient should undergo right leg angiography and intervention.  Risk and benefits were reviewed the patient.  Indications for the procedure were reviewed.  All questions were answered, the patient does not agree to proceed she has requested that she call back to the office after she has thought about it.   The patient should continue walking and begin a more formal exercise program.  The patient should continue antiplatelet therapy and aggressive treatment of the lipid abnormalities  The patient will follow up with me after the right leg angiogram.   2. Mixed hyperlipidemia Continue statin as ordered and reviewed, no changes at this time   3. Type 2 diabetes mellitus with diabetic peripheral angiopathy without gangrene, with long-term current use of insulin (HCC) Continue hypoglycemic medications as already ordered, these medications have been reviewed and there are no changes at this time.  Hgb A1C to be monitored as already arranged by primary service   4. Essential hypertension Continue antihypertensive medications as already ordered, these medications have been reviewed and there are no changes at this time.   [name], MD  06/13/2018 10:34 AM

## 2018-06-14 ENCOUNTER — Encounter: Payer: Self-pay | Admitting: Vascular Surgery

## 2018-06-15 ENCOUNTER — Telehealth (INDEPENDENT_AMBULATORY_CARE_PROVIDER_SITE_OTHER): Payer: Self-pay | Admitting: Vascular Surgery

## 2018-06-15 NOTE — Telephone Encounter (Signed)
Tiffany called stating that patient R leg is swollen, red and warm to touch. Also stated that her blood sugar was 503 at 1pm. Patient took insulin and at 3pm blood sugar was 193.   Spoke with Dr. Delana Meyer, he advised that the swelling and redness are normal. Advised getting patient in clinic on Monday to be seen by him. Advised patient to go to ED if patient in pain or if patient becomes concerned about infection.  [facility]/18.  Spoke with Tiffany (homehealth nurse) and advised her of the above. Also spoke with patient husband and patient son to advise of the above. They all verbalized understanding.  Patient scheduled to come in and see Schnier Monday at 10am.   Patient son did have questions about medication. Says patient hasnt been taking medications because after visit summery from discharge says not to. Per Schnier patient is advised to start taking all medications as prescribed including Plavix. Son verbalized understanding. AS, CMA

## 2018-06-16 ENCOUNTER — Encounter: Payer: Medicare Other | Attending: Physician Assistant | Admitting: Physician Assistant

## 2018-06-16 DIAGNOSIS — I482 Chronic atrial fibrillation, unspecified: Secondary | ICD-10-CM | POA: Diagnosis not present

## 2018-06-16 DIAGNOSIS — I132 Hypertensive heart and chronic kidney disease with heart failure and with stage 5 chronic kidney disease, or end stage renal disease: Secondary | ICD-10-CM | POA: Diagnosis not present

## 2018-06-16 DIAGNOSIS — Z823 Family history of stroke: Secondary | ICD-10-CM | POA: Insufficient documentation

## 2018-06-16 DIAGNOSIS — E785 Hyperlipidemia, unspecified: Secondary | ICD-10-CM | POA: Insufficient documentation

## 2018-06-16 DIAGNOSIS — I509 Heart failure, unspecified: Secondary | ICD-10-CM | POA: Diagnosis not present

## 2018-06-16 DIAGNOSIS — I340 Nonrheumatic mitral (valve) insufficiency: Secondary | ICD-10-CM | POA: Insufficient documentation

## 2018-06-16 DIAGNOSIS — Z809 Family history of malignant neoplasm, unspecified: Secondary | ICD-10-CM | POA: Insufficient documentation

## 2018-06-16 DIAGNOSIS — N186 End stage renal disease: Secondary | ICD-10-CM | POA: Diagnosis not present

## 2018-06-16 DIAGNOSIS — M1990 Unspecified osteoarthritis, unspecified site: Secondary | ICD-10-CM | POA: Insufficient documentation

## 2018-06-16 DIAGNOSIS — E11622 Type 2 diabetes mellitus with other skin ulcer: Secondary | ICD-10-CM | POA: Insufficient documentation

## 2018-06-16 DIAGNOSIS — Z8249 Family history of ischemic heart disease and other diseases of the circulatory system: Secondary | ICD-10-CM | POA: Insufficient documentation

## 2018-06-16 DIAGNOSIS — E1122 Type 2 diabetes mellitus with diabetic chronic kidney disease: Secondary | ICD-10-CM | POA: Diagnosis not present

## 2018-06-16 DIAGNOSIS — L97818 Non-pressure chronic ulcer of other part of right lower leg with other specified severity: Secondary | ICD-10-CM | POA: Diagnosis not present

## 2018-06-18 NOTE — Progress Notes (Signed)
[name] ([id number]) Visit Report for 06/16/2018 Arrival Information Details Patient Name: [name], [name] Date of Service: 06/16/2018 2:00 PM Medical Record Number: [id number] Patient Account Number: [id number] Date of Birth/Sex: 1925-09-09 (85 y.o. F) Treating RN: Harold Barban Primary Care Savior Himebaugh: Thereasa Distance Other Clinician: Referring Vladimir Lenhoff: Thereasa Distance Treating Latise Dilley/Extender: Melburn Hake, HOYT Weeks in Treatment: 2 Visit Information History Since Last Visit Added or deleted any medications: No Patient Arrived: Wheel Chair Any new allergies or adverse reactions: No Arrival Time: 14:18 Had a fall or experienced change in No Accompanied By: son activities of daily living that may affect Transfer Assistance: None risk of falls: Patient Identification Verified: Yes Signs or symptoms of abuse/neglect since last visito No Secondary Verification Process Completed: Yes Hospitalized since last visit: No Patient Has Alerts: Yes Has Dressing in Place as Prescribed: Yes Patient Alerts: DMII Pain Present Now: Yes Electronic Signature(s) Signed: 06/16/2018 3:21:09 PM By: Harold Barban Entered By: Harold Barban on 06/16/2018 14:18:56 Korf, [name] ([id number]) -------------------------------------------------------------------------------- Clinic Level of Care Assessment Details Patient Name: [name] Date of Service: 06/16/2018 2:00 PM Medical Record Number: [id number] Patient Account Number: [id number] Date of Birth/Sex: 03/18/1926 (85 y.o. F) Treating RN: [name] Primary Care Daina Cara: Thereasa Distance Other Clinician: Referring Viki Carrera: Thereasa Distance Treating Lorah Kalina/Extender: Melburn Hake, HOYT Weeks in Treatment: 2 Clinic Level of Care Assessment Items TOOL 4 Quantity Score []  - Use when only an EandM is performed on FOLLOW-UP visit 0 ASSESSMENTS - Nursing Assessment / Reassessment X - Reassessment of Co-morbidities (includes updates in  patient status) 1 10 X- 1 5 Reassessment of Adherence to Treatment Plan ASSESSMENTS - Wound and Skin Assessment / Reassessment X - Simple Wound Assessment / Reassessment - one wound 1 5 []  - 0 Complex Wound Assessment / Reassessment - multiple wounds []  - 0 Dermatologic / Skin Assessment (not related to wound area) ASSESSMENTS - Focused Assessment []  - Circumferential Edema Measurements - multi extremities 0 []  - 0 Nutritional Assessment / Counseling / Intervention X- 1 5 Lower Extremity Assessment (monofilament, tuning fork, pulses) []  - 0 Peripheral Arterial Disease Assessment (using hand held doppler) ASSESSMENTS - Ostomy and/or Continence Assessment and Care []  - Incontinence Assessment and Management 0 []  - 0 Ostomy Care Assessment and Management (repouching, etc.) PROCESS - Coordination of Care X - Simple Patient / Family Education for ongoing care 1 15 []  - 0 Complex (extensive) Patient / Family Education for ongoing care X- 1 10 Staff obtains Programmer, systems, Records, Test Results / Process Orders []  - 0 Staff telephones HHA, Nursing Homes / Clarify orders / etc []  - 0 Routine Transfer to another Facility (non-emergent condition) []  - 0 Routine Hospital Admission (non-emergent condition) X- 1 15 New Admissions / Biomedical engineer / Ordering NPWT, Apligraf, etc. []  - 0 Emergency Hospital Admission (emergent condition) X- 1 10 Simple Discharge Coordination Liddy, Lashawndra J. ([id number]) []  - 0 Complex (extensive) Discharge Coordination PROCESS - Special Needs []  - Pediatric / Minor Patient Management 0 []  - 0 Isolation Patient Management []  - 0 Hearing / Language / Visual special needs []  - 0 Assessment of Community assistance (transportation, D/C planning, etc.) []  - 0 Additional assistance / Altered mentation []  - 0 Support Surface(s) Assessment (bed, cushion, seat, etc.) INTERVENTIONS - Wound Cleansing / Measurement X - Simple Wound Cleansing - one wound 1  5 []  - 0 Complex Wound Cleansing - multiple wounds X- 1 5 Wound Imaging (photographs - any number of wounds) []  - 0 Wound Tracing (  instead of photographs) X- 1 5 Simple Wound Measurement - one wound []  - 0 Complex Wound Measurement - multiple wounds INTERVENTIONS - Wound Dressings []  - Small Wound Dressing one or multiple wounds 0 X- 1 15 Medium Wound Dressing one or multiple wounds []  - 0 Large Wound Dressing one or multiple wounds []  - 0 Application of Medications - topical []  - 0 Application of Medications - injection INTERVENTIONS - Miscellaneous []  - External ear exam 0 []  - 0 Specimen Collection (cultures, biopsies, blood, body fluids, etc.) []  - 0 Specimen(s) / Culture(s) sent or taken to Lab for analysis []  - 0 Patient Transfer (multiple staff / Civil Service fast streamer / Similar devices) []  - 0 Simple Staple / Suture removal (25 or less) []  - 0 Complex Staple / Suture removal (26 or more) []  - 0 Hypo / Hyperglycemic Management (close monitor of Blood Glucose) []  - 0 Ankle / Brachial Index (ABI) - do not check if billed separately X- 1 5 Vital Signs Killam, Adlene J. ([id number]) Has the patient been seen at the hospital within the last three years: Yes Total Score: 110 Level Of Care: New/Established - Level 3 Electronic Signature(s) Signed: 06/16/2018 4:27:08 PM By: [name] Entered By: [name] on 06/16/2018 14:49:45 Stoy, [name] ([id number]) -------------------------------------------------------------------------------- Lower Extremity Assessment Details Patient Name: [name] Date of Service: 06/16/2018 2:00 PM Medical Record Number: [id number] Patient Account Number: [id number] Date of Birth/Sex: [birth year] (85 y.o. F) Treating RN: Harold Barban Primary Care Libertie Hausler: Thereasa Distance Other Clinician: Referring Elga Santy: Thereasa Distance Treating Thaison Kolodziejski/Extender: Melburn Hake, HOYT Weeks in Treatment: 2 Edema Assessment Assessed: [Left: No] [Right:  No] [Left: Edema] [Right: :] Calf Left: Right: Point of Measurement: 31 cm From Medial Instep cm 28 cm Ankle Left: Right: Point of Measurement: 12 cm From Medial Instep cm 21 cm Vascular Assessment Pulses: Dorsalis Pedis Palpable: [Right:Yes] Posterior Tibial Palpable: [Right:Yes] Extremity colors, hair growth, and conditions: Extremity Color: [Right:Red] Hair Growth on Extremity: [Right:No] Temperature of Extremity: [Right:Warm] Capillary Refill: [Right:< 3 seconds] Toe Nail Assessment Left: Right: Thick: Yes Discolored: Yes Deformed: Yes Improper Length and Hygiene: Yes Electronic Signature(s) Signed: 06/16/2018 3:21:09 PM By: Harold Barban Entered By: Harold Barban on 06/16/2018 14:24:16 Larsh, [name] ([id number]) -------------------------------------------------------------------------------- Multi Wound Chart Details Patient Name: [name] Date of Service: 06/16/2018 2:00 PM Medical Record Number: [id number] Patient Account Number: [id number] Date of Birth/Sex: 1926-02-06 (85 y.o. F) Treating RN: [name] Primary Care Geovany Trudo: Thereasa Distance Other Clinician: Referring Consetta Cosner: Thereasa Distance Treating Alyxis Grippi/Extender: Melburn Hake, HOYT Weeks in Treatment: 2 Vital Signs Height(in): 48 Pulse(bpm): 9 Weight(lbs): 102 Blood Pressure(mmHg): 152/76 Body Mass Index(BMI): 18 Temperature(F): 97.8 Respiratory Rate 18 (breaths/min): Photos: [1:No Photos] [N/A:N/A] Wound Location: [1:Right Achilles] [N/A:N/A] Wounding Event: [1:Gradually Appeared] [N/A:N/A] Primary Etiology: [1:Arterial Insufficiency Ulcer] [N/A:N/A] Secondary Etiology: [1:Diabetic Wound/Ulcer of the Lower Extremity] [N/A:N/A] Comorbid History: [1:Anemia, Arrhythmia, Congestive Heart Failure, Hypertension, Type II Diabetes, End Stage Renal Disease, Osteoarthritis] [N/A:N/A] Date Acquired: [1:04/01/2018] [N/A:N/A] Weeks of Treatment: [1:2] [N/A:N/A] Wound Status: [1:Open]  [N/A:N/A] Pending Amputation on [1:Yes] [N/A:N/A] Presentation: Measurements L x W x D [1:3.4x2.5x0.3] [N/A:N/A] (cm) Area (cm) : [1:6.676] [N/A:N/A] Volume (cm) : [1:2.003] [N/A:N/A] % Reduction in Area: [1:-39.80%] [N/A:N/A] % Reduction in Volume: [1:-319.00%] [N/A:N/A] Classification: [1:Unclassifiable] [N/A:N/A] Exudate Amount: [1:Medium] [N/A:N/A] Exudate Type: [1:Serosanguineous] [N/A:N/A] Exudate Color: [1:red, brown] [N/A:N/A] Wound Margin: [1:Flat and Intact] [N/A:N/A] Granulation Amount: [1:None Present (0%)] [N/A:N/A] Necrotic Amount: [1:Small (1-33%)] [N/A:N/A] Necrotic Tissue: [1:Eschar, Adherent Slough] [N/A:N/A] Exposed Structures: [1:Fat Layer (  Subcutaneous Tissue) Exposed: Yes Fascia: No Tendon: No Muscle: No Joint: No Bone: No] [N/A:N/A] Epithelialization: None N/A N/A Periwound Skin Texture: Excoriation: No N/A N/A Induration: No Callus: No Crepitus: No Rash: No Scarring: No Periwound Skin Moisture: Maceration: No N/A N/A Dry/Scaly: No Periwound Skin Color: Erythema: Yes N/A N/A Atrophie Blanche: No Cyanosis: No Ecchymosis: No Hemosiderin Staining: No Mottled: No Pallor: No Rubor: No Erythema Location: Circumferential N/A N/A Temperature: No Abnormality N/A N/A Tenderness on Palpation: Yes N/A N/A Wound Preparation: Ulcer Cleansing: N/A N/A Rinsed/Irrigated with Saline Topical Anesthetic Applied: Other: lidocaine 4% Treatment Notes Electronic Signature(s) Signed: 06/16/2018 4:27:08 PM By: [name] Entered By: [name] on 06/16/2018 14:41:40 Hoefle, [name] ([id number]) -------------------------------------------------------------------------------- Multi-Disciplinary Care Plan Details Patient Name: [name] Date of Service: 06/16/2018 2:00 PM Medical Record Number: [id number] Patient Account Number: [id number] Date of Birth/Sex: [birth year] (85 y.o. F) Treating RN: [name] Primary Care Delores Edelstein: Thereasa Distance Other  Clinician: Referring Egan Berkheimer: Thereasa Distance Treating Afsheen Antony/Extender: Melburn Hake, HOYT Weeks in Treatment: 2 Active Inactive Abuse / Safety / Falls / Self Care Management Nursing Diagnoses: Potential for falls Goals: Patient will not experience any injury related to falls Date Initiated: 06/16/2018 Target Resolution Date: 09/16/2018 Goal Status: Active Interventions: Assess fall risk on admission and as needed Notes: Necrotic Tissue Nursing Diagnoses: Knowledge deficit related to management of necrotic/devitalized tissue Goals: Patient/caregiver will verbalize understanding of reason and process for debridement of necrotic tissue Date Initiated: 06/16/2018 Target Resolution Date: 09/16/2018 Goal Status: Active Interventions: Provide education on necrotic tissue and debridement process Notes: Nutrition Nursing Diagnoses: Imbalanced nutrition Goals: Patient/caregiver agrees to and verbalizes understanding of need to use nutritional supplements and/or vitamins as prescribed Date Initiated: 06/16/2018 Target Resolution Date: 09/16/2018 Goal Status: Active Interventions: Assess patient nutrition upon admission and as needed per policy RHYTHM, GUBBELS ([id number]) Notes: Wound/Skin Impairment Nursing Diagnoses: Impaired tissue integrity Knowledge deficit related to ulceration/compromised skin integrity Goals: Ulcer/skin breakdown will have a volume reduction of 30% by week 4 Date Initiated: 06/02/2018 Target Resolution Date: 07/01/2018 Goal Status: Active Interventions: Assess patient/caregiver ability to obtain necessary supplies Assess patient/caregiver ability to perform ulcer/skin care regimen upon admission and as needed Assess ulceration(s) every visit Notes: Electronic Signature(s) Signed: 06/16/2018 4:27:08 PM By: [name] Entered By: [name] on 06/16/2018 14:41:27 Ishee, [name]  ([id number]) -------------------------------------------------------------------------------- Pain Assessment Details Patient Name: [name] Date of Service: 06/16/2018 2:00 PM Medical Record Number: [id number] Patient Account Number: [id number] Date of Birth/Sex: 08-Jan-1926 (85 y.o. F) Treating RN: Harold Barban Primary Care Vicki Pasqual: Thereasa Distance Other Clinician: Referring Ayanah Snader: Thereasa Distance Treating Charlee Squibb/Extender: Melburn Hake, HOYT Weeks in Treatment: 2 Active Problems Location of Pain Severity and Description of Pain Patient Has Paino Yes Site Locations Pain Location: Pain in Ulcers Rate the pain. Current Pain Level: 5 Pain Management and Medication Current Pain Management: Electronic Signature(s) Signed: 06/16/2018 3:21:09 PM By: Harold Barban Entered By: Harold Barban on 06/16/2018 14:20:09 Garlitz, [name] ([id number]) -------------------------------------------------------------------------------- Patient/Caregiver Education Details Patient Name: [name] Date of Service: 06/16/2018 2:00 PM Medical Record Number: [id number] Patient Account Number: [id number] Date of Birth/Gender: 07/10/1925 (85 y.o. F) Treating RN: [name] Primary Care Physician: Thereasa Distance Other Clinician: Referring Physician: Thereasa Distance Treating Physician/Extender: Sharalyn Ink in Treatment: 2 Education Assessment Education Provided To: Patient and Caregiver Education Topics Provided Wound/Skin Impairment: Handouts: Other: wound care as ordered Methods: Demonstration, Explain/Verbal Responses: State content correctly Electronic Signature(s) Signed: 06/16/2018 4:27:08 PM By: [name] Entered  By: [name] on 06/16/2018 14:50:05 Setter, [name] ([id number]) -------------------------------------------------------------------------------- Wound Assessment Details Patient Name: DEALVA, LAFOY Date of Service: 06/16/2018 2:00 PM Medical  Record Number: [id number] Patient Account Number: [id number] Date of Birth/Sex: 14-Sep-1925 (85 y.o. 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 Wounding Event: Gradually Appeared Etiology: Date Acquired: 04/01/2018 Wound Open Weeks Of Treatment: 2 Status: Clustered Wound: No Comorbid Anemia, Arrhythmia, Congestive Heart Failure, Pending Amputation On Presentation History: Hypertension, Type II Diabetes, End Stage Renal Disease, Osteoarthritis Photos Photo Uploaded By: Army Melia on 06/16/2018 15:14:30 Wound Measurements Length: (cm) 3.4 Width: (cm) 2.5 Depth: (cm) 0.3 Area: (cm) 6.676 Volume: (cm) 2.003 % Reduction in Area: -39.8% % Reduction in Volume: -319% Epithelialization: None Tunneling: No Undermining: No Wound Description Classification: Unclassifiable Foul Odo Wound Margin: Flat and Intact Slough/F Exudate Amount: Medium Exudate Type: Serosanguineous Exudate Color: red, brown r After Cleansing: No ibrino Yes Wound Bed Granulation Amount: None Present (0%) Exposed Structure Necrotic Amount: Small (1-33%) Fascia Exposed: No Necrotic Quality: Eschar, Adherent Slough Fat Layer (Subcutaneous Tissue) Exposed: Yes Tendon Exposed: No Muscle Exposed: No Joint Exposed: No Bone Exposed: No Barnhart, Meta J. ([id number]) Periwound Skin Texture Texture Color No Abnormalities Noted: No No Abnormalities Noted: No Callus: No Atrophie Blanche: No Crepitus: No Cyanosis: No Excoriation: No Ecchymosis: No Induration: No Erythema: Yes Rash: No Erythema Location: Circumferential Scarring: No Hemosiderin Staining: No Mottled: No Moisture Pallor: No No  Abnormalities Noted: No Rubor: No Dry / Scaly: No Maceration: No Temperature / Pain Temperature: No Abnormality Tenderness on Palpation: Yes Wound Preparation Ulcer Cleansing: Rinsed/Irrigated with Saline Topical Anesthetic Applied: Other: lidocaine 4%, Electronic Signature(s) Signed: 06/16/2018 3:21:09 PM By: Harold Barban Entered By: Harold Barban on 06/16/2018 14:22:17 Wollman, [name] ([id number]) -------------------------------------------------------------------------------- Vitals Details Patient Name: [name] Date of Service: 06/16/2018 2:00 PM Medical Record Number: [id number] Patient Account Number: [id number] Date of Birth/Sex: Aug 13, 1925 (85 y.o. F) Treating RN: Harold Barban Primary Care Lonia Roane: Thereasa Distance Other Clinician: Referring Lyan Moyano: Thereasa Distance Treating Maejor Erven/Extender: Melburn Hake, HOYT Weeks in Treatment: 2 Vital Signs Time Taken: 14:20 Temperature (F): 97.8 Height (in): 63 Pulse (bpm): 85 Weight (lbs): 102 Respiratory Rate (breaths/min): 18 Body Mass Index (BMI): 18.1 Blood Pressure (mmHg): 152/76 Reference Range: 80 - 120 mg / dl Electronic Signature(s) Signed: 06/16/2018 3:21:09 PM By: Harold Barban Entered By: Harold Barban on 06/16/2018 14:20:33

## 2018-06-19 ENCOUNTER — Encounter (INDEPENDENT_AMBULATORY_CARE_PROVIDER_SITE_OTHER): Payer: Self-pay | Admitting: Vascular Surgery

## 2018-06-19 ENCOUNTER — Other Ambulatory Visit: Payer: Self-pay

## 2018-06-19 ENCOUNTER — Ambulatory Visit (INDEPENDENT_AMBULATORY_CARE_PROVIDER_SITE_OTHER): Payer: Medicare Other | Admitting: Vascular Surgery

## 2018-06-19 VITALS — BP 155/81 | HR 83 | Resp 10 | Ht 62.0 in | Wt 124.0 lb

## 2018-06-19 DIAGNOSIS — I739 Peripheral vascular disease, unspecified: Secondary | ICD-10-CM | POA: Diagnosis not present

## 2018-06-19 DIAGNOSIS — E1151 Type 2 diabetes mellitus with diabetic peripheral angiopathy without gangrene: Secondary | ICD-10-CM | POA: Diagnosis not present

## 2018-06-19 DIAGNOSIS — I10 Essential (primary) hypertension: Secondary | ICD-10-CM

## 2018-06-19 DIAGNOSIS — E782 Mixed hyperlipidemia: Secondary | ICD-10-CM

## 2018-06-19 DIAGNOSIS — Z794 Long term (current) use of insulin: Secondary | ICD-10-CM

## 2018-06-19 DIAGNOSIS — Z9862 Peripheral vascular angioplasty status: Secondary | ICD-10-CM

## 2018-06-19 DIAGNOSIS — Z79899 Other long term (current) drug therapy: Secondary | ICD-10-CM

## 2018-06-19 NOTE — Progress Notes (Signed)
TAHANI, POTIER ([id number]) Visit Report for 06/16/2018 Chief Complaint Document Details Patient Name: [name], [name] Date of Service: 06/16/2018 2:00 PM Medical Record Number: [id number] Patient Account Number: [id number] Date of Birth/Sex: 1925-11-07 (85 y.o. F) Treating RN: [name] Primary Care Provider: Thereasa Distance Other Clinician: Referring Provider: Thereasa Distance Treating Provider/Extender: Melburn Hake, HOYT Weeks in Treatment: 2 Information Obtained from: Patient Chief Complaint Right Lower leg Achilles ulcer Electronic Signature(s) Signed: 06/17/2018 6:13:30 AM By: Worthy Keeler PA-C Entered By: Worthy Keeler on 06/16/2018 14:37:17 [place], [name] ([id number]) -------------------------------------------------------------------------------- HPI Details Patient Name: [name] Date of Service: 06/16/2018 2:00 PM Medical Record Number: [id number] Patient Account Number: [id number] Date of Birth/Sex: Oct 11, 1925 (85 y.o. F) Treating RN: [name] Primary Care Provider: Thereasa Distance Other Clinician: Referring Provider: Thereasa Distance Treating Provider/Extender: Melburn Hake, HOYT Weeks in Treatment: 2 History of Present Illness HPI Description: 06/02/18 patient presents today for initial evaluation our clinic concerning the ulcers that she has on the right Achilles location. 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 She was subsequently supposed to have had a scheduled likely stent placement in December 2019 unfortunately she had a fall with a sub dural hematoma which subsequently had to be evacuated partially at least. For that reason she has never gotten back in with Dr. Delana Meyer to have the vascular procedure. She does not have any appointment scheduled with him at this time. The patient does have a history of chronic kidney disease stage III, congestive heart failure, trophy relation, and at this  point home health who has been coming out has been using [facility]. This is been since she's been home towards the end of December as best we can tell. She does have a hemoglobin A1c of 7.0 which was obtained on 11/18/17. Currently the patient's right lower extremity that showed diminished blood flow according to her arterial study which is dated 02/27/18. At that point her right ABI was 0.89 with a TBI of 0.36 and her left ABI was 1.13 with a TBI of 0.50. Her prior study which was in May on the 13th 2019 showed that she had a right ABI of one and a right TBI of 0.51 with a left ABI of 0.72 and a TBI of 0.50. Obviously since May till now her right leg has diminished as far as the blood flows concerned patient's son who was with her today states that Dr. 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 I do believe that for limb salvage this patient needs to have the above stated procedure with Dr. Delana Meyer as soon as possible. 06/09/18 on evaluation today patient actually appears to be doing about the same in regard to her Achilles ulcer. This actually does have tended exposed however the tenant is remaining moist which is good news that's exactly what we want to see. Fortunately there's no signs of infection she does have an appointment with Dr. Delana Meyer this coming Tuesday, June 13, 2018. 06/16/18 on evaluation today patient actually appears to be doing rather well in regard to her right lower extremity at this point. The Achilles ulcer actually showed signs of being a little bit better as far as the overall appearance of the wound was concerned today. Fortunately there did not appear to be the evidence of infection at this time which is good news. With that being said she did have her angiogram where she did actually  have a significant blockage of the 90% into her lower extremity. Fortunately between the balloon angioplasty followed by stating this was able to be improved to less  than 5% residual stenosis according to Dr. Nino Parsley note that I read. She still may have some limitation into her foot in particular as far as the runoff is concerned but nonetheless in general appears to be doing much better which is great news. Fortunately there is no sign of infection at this time. Electronic Signature(s) Signed: 06/17/2018 6:13:30 AM By: Worthy Keeler PA-C Entered By: Worthy Keeler on 06/17/2018 06:10:17 [place], [name] ([id number]) -------------------------------------------------------------------------------- Physical Exam Details Patient Name: [name] Date of Service: 06/16/2018 2:00 PM Medical Record Number: [id number] Patient Account Number: [id number] Date of Birth/Sex: Feb 08, 1926 (85 y.o. F) Treating RN: [name] Primary Care Provider: Thereasa Distance Other Clinician: Referring Provider: Thereasa Distance Treating Provider/Extender: Melburn Hake, HOYT Weeks in Treatment: 2 Constitutional Chronically ill appearing but in no apparent acute distress. Respiratory normal breathing without difficulty. clear to auscultation bilaterally. Cardiovascular regular rate and rhythm with normal S1, S2. Psychiatric this patient is able to make decisions and demonstrates good insight into disease process. Alert and Oriented x 3. pleasant and cooperative. Notes Patient's wound bed currently shows evidence of significant tenant exposure in the Achilles region unfortunately. Due to her multiple comorbidities I do not believe she's likely candidate for plastic surgery although this was something that was discussed today as well. Her son tends to think that she would not be a candidate really for much at all in relation to surgery. I'm definitely in agreement with this. With that being said I do think we could consider a skin substitute for her this may be of great benefit. Fortunately there's no signs of infection at this time which is good news. Electronic  Signature(s) Signed: 06/17/2018 6:13:30 AM By: Worthy Keeler PA-C Entered By: Worthy Keeler on 06/17/2018 06:11:18 [place], [name] ([id number]) -------------------------------------------------------------------------------- Physician Orders Details Patient Name: [name] Date of Service: 06/16/2018 2:00 PM Medical Record Number: [id number] Patient Account Number: [id number] Date of Birth/Sex: 1925/11/13 (85 y.o. F) Treating RN: [name] Primary Care Provider: Thereasa Distance Other Clinician: Referring Provider: Thereasa Distance Treating Provider/Extender: Melburn Hake, HOYT Weeks in Treatment: 2 Verbal / Phone Orders: No Diagnosis Coding ICD-10 Coding Code Description I73.89 Other specified peripheral vascular diseases L97.818 Non-pressure chronic ulcer of other part of right lower leg with other specified severity N18.3 Chronic kidney disease, stage 3 (moderate) I48.20 Chronic atrial fibrillation, unspecified Wound Cleansing Wound #1 Right Achilles o Clean wound with Normal Saline. Primary Wound Dressing Wound #1 Right Achilles o Mepitel One Contact layer - cover hydrogel and tendon o Hydrogel - or [place] Jelly over exposed tendon o Silver Collagen - over entire wound surface Secondary Dressing Wound #1 Right Achilles o ABD and Kerlix/Conform Dressing Change Frequency Wound #1 Right Achilles o Change dressing every other day. Follow-up Appointments Wound #1 Right Achilles o Return Appointment in 1 week. Home Health Wound #1 Right Achilles o Continue Home Health Visits o Home Health Nurse may visit PRN to address patientos wound care needs. o FACE TO FACE ENCOUNTER: MEDICARE and MEDICAID PATIENTS: I certify that this patient is under my care and that I had a face-to-face encounter that meets the physician face-to-face encounter requirements with this patient on this date. The encounter with the patient was in whole or in part for the following  MEDICAL CONDITION: (primary reason for [place]) MEDICAL  NECESSITY: I certify, that based on my findings, NURSING services are a medically necessary home health service. HOME BOUND STATUS: I certify that my clinical findings support that this patient is homebound (i.e., Due to illness or injury, pt requires aid of supportive devices such as crutches, cane, wheelchairs, walkers, the use of special transportation or the assistance of another person to leave their place of residence. There is a normal inability to leave the home Veloso, Lasharon J. ([id number]) and doing so requires considerable and taxing effort. Other absences are for medical reasons / religious services and are infrequent or of short duration when for other reasons). o If current dressing causes regression in wound condition, may D/C ordered dressing product/s and apply Normal Saline Moist Dressing daily until next [place] / Other MD appointment. [place] of regression in wound condition at [phone]. o Please direct any NON-WOUND related issues/requests for orders to patient's Primary Care Physician Medications-please add to medication list. Wound #1 Right Achilles o P.O. Antibiotics - Complete all medications Electronic Signature(s) Signed: 06/16/2018 4:27:08 PM By: [name] Signed: 06/17/2018 6:13:30 AM By: Worthy Keeler PA-C Entered By: [name] on 06/16/2018 14:49:09 Jarboe, [name] ([id number]) -------------------------------------------------------------------------------- Problem List Details Patient Name: [name] Date of Service: 06/16/2018 2:00 PM Medical Record Number: [id number] Patient Account Number: [id number] Date of Birth/Sex: 18-Nov-1925 (85 y.o. F) Treating RN: [name] Primary Care Provider: Thereasa Distance Other Clinician: Referring Provider: Thereasa Distance Treating Provider/Extender: Melburn Hake, HOYT Weeks in Treatment: 2 Active  Problems ICD-10 Evaluated Encounter Code Description Active Date Today Diagnosis I73.89 Other specified peripheral vascular diseases 06/02/2018 No Yes L97.818 Non-pressure chronic ulcer of other part of right lower leg 06/02/2018 No Yes with other specified severity N18.3 Chronic kidney disease, stage 3 (moderate) 06/02/2018 No Yes I48.20 Chronic atrial fibrillation, unspecified 06/02/2018 No Yes Inactive Problems Resolved Problems Electronic Signature(s) Signed: 06/17/2018 6:13:30 AM By: Worthy Keeler PA-C Entered By: Worthy Keeler on 06/16/2018 14:37:03 [place], [name] ([id number]) -------------------------------------------------------------------------------- Progress Note Details Patient Name: [name] Date of Service: 06/16/2018 2:00 PM Medical Record Number: [id number] Patient Account Number: [id number] Date of Birth/Sex: 1925/11/29 (85 y.o. F) Treating RN: [name] Primary Care Provider: Thereasa Distance Other Clinician: Referring Provider: Thereasa Distance Treating Provider/Extender: Melburn Hake, HOYT Weeks in Treatment: 2 Subjective Chief Complaint Information obtained from Patient Right Lower leg Achilles ulcer History of Present Illness (HPI) 06/02/18 patient presents today for initial evaluation our clinic concerning the ulcers that she has on the right Achilles location. 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 She was subsequently supposed to have had a scheduled likely stent placement in December 2019 unfortunately she had a fall with a sub dural hematoma which subsequently had to be evacuated partially at least. For that reason she has never gotten back in with Dr. Delana Meyer to have the vascular procedure. She does not have any appointment scheduled with him at this time. The patient does have a history of chronic kidney disease stage III, congestive heart failure, trophy relation, and at this point  home health who has been coming out has been using [facility]. This is been since she's been home towards the end of December as best we can tell. She does have a hemoglobin A1c of 7.0 which was obtained on 11/18/17. Currently the patient's right lower extremity that showed diminished blood flow according to her arterial study which is dated 02/27/18.  At that point her right ABI was 0.89 with a TBI of 0.36 and her left ABI was 1.13 with a TBI of 0.50. Her prior study which was in May on the 13th 2019 showed that she had a right ABI of one and a right TBI of 0.51 with a left ABI of 0.72 and a TBI of 0.50. Obviously since May till now her right leg has diminished as far as the blood flows concerned patient's son who was with her today states that Dr. 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 I do believe that for limb salvage this patient needs to have the above stated procedure with Dr. Delana Meyer as soon as possible. 06/09/18 on evaluation today patient actually appears to be doing about the same in regard to her Achilles ulcer. This actually does have tended exposed however the tenant is remaining moist which is good news that's exactly what we want to see. Fortunately there's no signs of infection she does have an appointment with Dr. Delana Meyer this coming Tuesday, June 13, 2018. 06/16/18 on evaluation today patient actually appears to be doing rather well in regard to her right lower extremity at this point. The Achilles ulcer actually showed signs of being a little bit better as far as the overall appearance of the wound was concerned today. Fortunately there did not appear to be the evidence of infection at this time which is good news. With that being said she did have her angiogram where she did actually have a significant blockage of the 90% into her lower extremity. Fortunately between the balloon angioplasty followed by stating this was able to be improved to less than 5%  residual stenosis according to Dr. Nino Parsley note that I read. She still may have some limitation into her foot in particular as far as the runoff is concerned but nonetheless in general appears to be doing much better which is great news. Fortunately there is no sign of infection at this time. Patient History Information obtained from Patient. Family History Cancer - Siblings, Heart Disease - Mother,Siblings,Father, Hypertension - Mother,Father,Siblings, Stroke - Father, No family history of Diabetes, Hereditary Spherocytosis, Kidney Disease, Lung Disease, Seizures, Thyroid Problems, Tuberculosis. Social History Never smoker, Marital Status - Married, Alcohol Use - Never, Drug Use - No History, Caffeine Use - Daily. Medical History [name], [name] ([id number]) Eyes Denies history of Cataracts, Glaucoma Ear/Nose/Mouth/Throat Denies history of Chronic sinus problems/congestion, Middle ear problems Hematologic/Lymphatic Patient has history of Anemia Denies history of Hemophilia, Human Immunodeficiency Virus, Lymphedema, Sickle Cell Disease Respiratory Denies history of Aspiration, Asthma, Chronic Obstructive Pulmonary Disease (COPD), Pneumothorax, Sleep Apnea, Tuberculosis Cardiovascular Patient has history of Arrhythmia - a fib, Congestive Heart Failure, Hypertension Denies history of Angina, Coronary Artery Disease, Deep Vein Thrombosis, Hypotension, Myocardial Infarction, Peripheral Arterial Disease, Peripheral Venous Disease, Phlebitis, Vasculitis Gastrointestinal Denies history of Cirrhosis , Colitis, Crohn s, Hepatitis A, Hepatitis B, Hepatitis C Endocrine Patient has history of Type II Diabetes Denies history of Type I Diabetes Genitourinary Patient has history of End Stage Renal Disease - CKD stage 3 Immunological Denies history of Lupus Erythematosus, Raynaud s, Scleroderma Integumentary (Skin) Denies history of History of Burn, History of pressure  wounds Musculoskeletal Patient has history of Osteoarthritis Denies history of Gout, Rheumatoid Arthritis, Osteomyelitis Neurologic Denies history of Dementia, Neuropathy, Quadriplegia, Paraplegia, Seizure Disorder Oncologic Denies history of Received Chemotherapy, Received Radiation Psychiatric Denies history of Anorexia/bulimia, Confinement Anxiety Medical And Surgical History Notes Cardiovascular HLD, mitral insufficiency  Musculoskeletal 4 broken ribs Review of Systems (ROS) Constitutional Symptoms (General Health) Denies complaints or symptoms of Fever, Chills. Respiratory The patient has no complaints or symptoms. Cardiovascular The patient has no complaints or symptoms. Psychiatric The patient has no complaints or symptoms. [name], [name]. ([id number]) Objective Constitutional Chronically ill appearing but in no apparent acute distress. Vitals Time Taken: 2:20 PM, Height: 63 in, Weight: 102 lbs, BMI: 18.1, Temperature: 97.8 F, Pulse: 85 bpm, Respiratory Rate: 18 breaths/min, Blood Pressure: 152/76 mmHg. Respiratory normal breathing without difficulty. clear to auscultation bilaterally. Cardiovascular regular rate and rhythm with normal S1, S2. Psychiatric this patient is able to make decisions and demonstrates good insight into disease process. Alert and Oriented x 3. pleasant and cooperative. General Notes: Patient's wound bed currently shows evidence of significant tenant exposure in the Achilles region unfortunately. Due to her multiple comorbidities I do not believe she's likely candidate for plastic surgery although this was something that was discussed today as well. Her son tends to think that she would not be a candidate really for much at all in relation to surgery. I'm definitely in agreement with this. With that being said I do think we could consider a skin substitute for her this may be of great benefit. Fortunately there's no signs of infection at this  time which is good news. Integumentary (Hair, Skin) Wound #1 status is Open. Original cause of wound was Gradually Appeared. The wound is located on the Right Achilles. The wound measures 3.4cm length x 2.5cm width x 0.3cm depth; 6.676cm^2 area and 2.003cm^3 volume. There is Fat Layer (Subcutaneous Tissue) Exposed exposed. There is no tunneling or undermining noted. There is a medium amount of serosanguineous drainage noted. The wound margin is flat and intact. There is no granulation within the wound bed. There is a small (1-33%) amount of necrotic tissue within the wound bed including Eschar and Adherent Slough. The periwound skin appearance exhibited: Erythema. The periwound skin appearance did not exhibit: Callus, Crepitus, Excoriation, Induration, Rash, Scarring, Dry/Scaly, Maceration, Atrophie Blanche, Cyanosis, Ecchymosis, Hemosiderin Staining, Mottled, Pallor, Rubor. The surrounding wound skin color is noted with erythema which is circumferential. Periwound temperature was noted as No Abnormality. The periwound has tenderness on palpation. Assessment Active Problems ICD-10 Other specified peripheral vascular diseases Non-pressure chronic ulcer of other part of right lower leg with other specified severity Chronic kidney disease, stage 3 (moderate) Chronic atrial fibrillation, unspecified [place], Alcie J. ([id number]) Wound Cleansing: Wound #1 Right Achilles: Clean wound with Normal Saline. Primary Wound Dressing: Wound #1 Right Achilles: Mepitel One Contact layer - cover hydrogel and tendon Hydrogel - or [place] Jelly over exposed tendon Silver Collagen - over entire wound surface Secondary Dressing: Wound #1 Right Achilles: ABD and Kerlix/Conform Dressing Change Frequency: Wound #1 Right Achilles: Change dressing every other day. Follow-up Appointments: Wound #1 Right Achilles: Return Appointment in 1 week. Home Health: Wound #1 Right Achilles: [place] Nurse may visit PRN to address patient s wound care needs. FACE TO FACE ENCOUNTER: MEDICARE and MEDICAID PATIENTS: I certify that this patient is under my care and that I had a face-to-face encounter that meets the physician face-to-face encounter requirements with this patient on this date. The encounter with the patient was in whole or in part for the following MEDICAL CONDITION: (primary reason for [place]) MEDICAL NECESSITY: I certify, that based on my findings, NURSING services are a medically necessary home health service. HOME BOUND STATUS: I certify that my  clinical findings support that this patient is homebound (i.e., Due to illness or injury, pt requires aid of supportive devices such as crutches, cane, wheelchairs, walkers, the use of special transportation or the assistance of another person to leave their place of residence. There is a normal inability to leave the home and doing so requires considerable and taxing effort. Other absences are for medical reasons / religious services and are infrequent or of short duration when for other reasons). If current dressing causes regression in wound condition, may D/C ordered dressing product/s and apply Normal Saline Moist Dressing daily until next [place] / Other MD appointment. [place] of regression in wound condition at [phone]. Please direct any NON-WOUND related issues/requests for orders to patient's Primary Care Physician Medications-please add to medication list.: Wound #1 Right Achilles: P.O. Antibiotics - Complete all medications My suggestion is that we could consider EpiFix for the patient to see if this will be beneficial as far as attempting to allow for new skin growth. I want to watch this at least however until she has her follow-up appointment with Dr. Delana Meyer to ensure that she overall still appears to be doing well as far as her arterial flow is concerned.  In the meantime we will however check on approval for this especially in light of the fact that he patient appears to have much better blood flow into her right lower extremity at this point status post procedure. We will see were things stand at follow-up. Please see above for specific wound care orders. We will see patient for re-evaluation in 1 week(s) here in the clinic. If anything worsens or changes patient will contact our office for additional recommendations. Electronic Signature(s) Signed: 06/17/2018 6:13:30 AM By: Worthy Keeler PA-C Entered By: Worthy Keeler on 06/17/2018 06:12:19 [place], [name] ([id number]) -------------------------------------------------------------------------------- ROS/PFSH Details Patient Name: [name] Date of Service: 06/16/2018 2:00 PM Medical Record Number: [id number] Patient Account Number: [id number] Date of Birth/Sex: October 02, 1925 (85 y.o. F) Treating RN: [name] Primary Care Provider: Thereasa Distance Other Clinician: Referring Provider: Thereasa Distance Treating Provider/Extender: Melburn Hake, HOYT Weeks in Treatment: 2 Information Obtained From Patient Wound History Do you currently have one or more open woundso Yes How many open wounds do you currently haveo 1 Approximately how long have you had your woundso 2 months How have you been treating your wound(s) until nowo [facility] Has your wound(s) ever healed and then re-openedo No Have you had any lab work done in the past montho No Have you tested positive for an antibiotic resistant organism (MRSA, VRE)o No Have you tested positive for osteomyelitis (bone infection)o No Have you had any tests for circulation on your legso Yes Where was the test doneo [facility] Constitutional Symptoms (General Health) Complaints and Symptoms: Negative for: Fever; Chills Eyes Medical History: Negative for: Cataracts; Glaucoma Ear/Nose/Mouth/Throat Medical History: Negative for: Chronic sinus  problems/congestion; Middle ear problems Hematologic/Lymphatic Medical History: Positive for: Anemia Negative for: Hemophilia; Human Immunodeficiency Virus; Lymphedema; Sickle Cell Disease Respiratory Complaints and Symptoms: No Complaints or Symptoms Medical History: Negative for: Aspiration; Asthma; Chronic Obstructive Pulmonary Disease (COPD); Pneumothorax; Sleep Apnea; Tuberculosis Cardiovascular Complaints and Symptoms: No Complaints or Symptoms Eberlin, Nickey J. ([id number]) Medical History: Positive for: Arrhythmia - a fib; Congestive Heart Failure; Hypertension Negative for: Angina; Coronary Artery Disease; Deep Vein Thrombosis; Hypotension; Myocardial Infarction; Peripheral Arterial Disease; Peripheral Venous Disease; Phlebitis; Vasculitis Past Medical History Notes: HLD, mitral insufficiency Gastrointestinal Medical History: Negative for: Cirrhosis ;  Colitis; [place]; Hepatitis A; Hepatitis B; Hepatitis C Endocrine Medical History: Positive for: Type II Diabetes Negative for: Type I Diabetes Treated with: Insulin Blood sugar tested every day: Yes Tested : Genitourinary Medical History: Positive for: End Stage Renal Disease - CKD stage 3 Immunological Medical History: Negative for: Lupus Erythematosus; Raynaudos; Scleroderma Integumentary (Skin) Medical History: Negative for: History of Burn; History of pressure wounds Musculoskeletal Medical History: Positive for: Osteoarthritis Negative for: Gout; Rheumatoid Arthritis; Osteomyelitis Past Medical History Notes: 4 broken ribs Neurologic Medical History: Negative for: Dementia; Neuropathy; Quadriplegia; Paraplegia; Seizure Disorder Oncologic Medical History: Negative for: Received Chemotherapy; Received Radiation Psychiatric Complaints and Symptoms: No Complaints or Symptoms Medical History: [name], [name] ([id number]) Negative for: Anorexia/bulimia; Confinement Anxiety Immunizations Pneumococcal  Vaccine: Received Pneumococcal Vaccination: Yes Immunization Notes: up to date Implantable Devices No devices added Family and Social History Cancer: Yes - Siblings; Diabetes: No; Heart Disease: Yes - Mother,Siblings,Father; Hereditary Spherocytosis: No; Hypertension: Yes - Mother,Father,Siblings; Kidney Disease: No; Lung Disease: No; Seizures: No; Stroke: Yes - Father; Thyroid Problems: No; Tuberculosis: No; Never smoker; Marital Status - Married; Alcohol Use: Never; Drug Use: No History; Caffeine Use: Daily; Financial Concerns: No; Food, Clothing or Shelter Needs: No; Support System Lacking: No; Transportation Concerns: No; Advanced Directives: Yes (Not Provided); Patient does not want information on Advanced Directives; Medical Power of Attorney: Yes - Tyishia Aune Joaquim Lai (Not Provided) Physician Affirmation I have reviewed and agree with the above information. Electronic Signature(s) Signed: 06/17/2018 6:13:30 AM By: Worthy Keeler PA-C Signed: 06/19/2018 10:44:46 AM By: [name] Entered By: Worthy Keeler on 06/17/2018 06:10:35 Hammers, [name] ([id number]) -------------------------------------------------------------------------------- SuperBill Details Patient Name: [name] Date of Service: 06/16/2018 Medical Record Number: [id number] Patient Account Number: [id number] Date of Birth/Sex: [birth year] (85 y.o. F) Treating RN: [name] Primary Care Provider: Thereasa Distance Other Clinician: Referring Provider: Thereasa Distance Treating Provider/Extender: Melburn Hake, HOYT Weeks in Treatment: 2 Diagnosis Coding ICD-10 Codes Code Description I73.89 Other specified peripheral vascular diseases L97.818 Non-pressure chronic ulcer of other part of right lower leg with other specified severity N18.3 Chronic kidney disease, stage 3 (moderate) I48.20 Chronic atrial fibrillation, unspecified Facility Procedures CPT4 Code: [id number] Description: 99213 - WOUND CARE VISIT-LEV 3  EST PT Modifier: Quantity: 1 Physician Procedures CPT4 Code Description: 2122482 50037 - WC PHYS LEVEL 4 - EST PT ICD-10 Diagnosis Description I73.89 Other specified peripheral vascular diseases L97.818 Non-pressure chronic ulcer of other part of right lower leg wit N18.3 Chronic kidney disease, stage 3  (moderate) I48.20 Chronic atrial fibrillation, unspecified Modifier: h other specified Quantity: 1 severity Electronic Signature(s) Signed: 06/17/2018 6:13:30 AM By: Worthy Keeler PA-C Entered By: Worthy Keeler on 06/17/2018 06:12:36

## 2018-06-19 NOTE — Progress Notes (Signed)
MRN : [id number]  [name] is a 85 y.o. (Nov 25, 1925) female who presents with chief complaint of No chief complaint on file. Marland Kitchen  History of Present Illness:   The patient returns to the office for followup and review status post right leg angiogram with intervention on 06/13/2018.   Procedure(s) Performed: 1. Introduction catheter into right lower extremity 3rd order catheter placement  2. Contrast injection right lower extremity for distal runoff   3. Crosser atherectomy of the right SFA and popliteal arteries 4.  Percutaneous transluminal angioplasty and stent placement right superficial femoral artery and popliteal             5.  Percutaneous transluminal angioplasty to 3 mm right anterior tibial artery                         6.     Percutaneous transluminal angioplasty to 3 mm of the right peroneal artery               7.   Star close closure left common femoral arteriotomy  The patient notes her right leg has become very swollen and red with increased tenderness.  Previous wounds have not yet healed.  No new ulcers or wounds have occurred since the last visit.  There have been no significant changes to the patient's overall health care.  The patient denies amaurosis fugax or recent TIA symptoms. There are no recent neurological changes noted. The patient denies history of DVT, PE or superficial thrombophlebitis. The patient denies recent episodes of angina or shortness of breath.     No outpatient medications have been marked as taking for the 06/19/18 encounter (Appointment) with Delana Meyer, Dolores Lory, MD.    Past Medical History:  Diagnosis Date  . A-fib ([place])   . CHF (congestive heart failure) ([place])   . Diabetes mellitus without complication ([place])   . Hypertension     Past Surgical History:  Procedure Laterality Date  . CHOLECYSTECTOMY    . LOWER EXTREMITY ANGIOGRAPHY Right 08/12/2017   Procedure: LOWER EXTREMITY  ANGIOGRAPHY;  Surgeon: Katha Cabal, MD;  Location: [facility];  Service: Cardiovascular;  Laterality: Right;  . LOWER EXTREMITY ANGIOGRAPHY Right 06/13/2018   Procedure: LOWER EXTREMITY ANGIOGRAPHY;  Surgeon: Katha Cabal, MD;  Location: [facility];  Service: Cardiovascular;  Laterality: Right;    Social History Social History   Tobacco Use  . Smoking status: Never Smoker  . Smokeless tobacco: Never Used  Substance Use Topics  . Alcohol use: No    Frequency: Never  . Drug use: No    Family History Family History  Problem Relation Age of Onset  . Cancer Brother     Allergies  Allergen Reactions  . Sulfa Antibiotics Hives     REVIEW OF SYSTEMS (Negative unless checked)  Constitutional: [] Weight loss  [] Fever  [] Chills Cardiac: [] Chest pain   [] Chest pressure   [] Palpitations   [] Shortness of breath when laying flat   [x] Shortness of breath with exertion. Vascular:  [x] Pain in legs with walking   [x] Pain in legs at rest  [] History of DVT   [] Phlebitis   [x] Swelling in legs   [] Varicose veins   [] Non-healing ulcers Pulmonary:   [] Uses home oxygen   [] Productive cough   [] Hemoptysis   [] Wheeze  [x] COPD   [] Asthma Neurologic:  [] Dizziness   [] Seizures   [x] History of stroke   [] History of TIA  [] Aphasia   []   Vissual changes   [] Weakness or numbness in arm   [] Weakness or numbness in leg Musculoskeletal:   [] Joint swelling   [x] Joint pain   [] Low back pain Hematologic:  [] Easy bruising  [] Easy bleeding   [] Hypercoagulable state   [] Anemic Gastrointestinal:  [] Diarrhea   [] Vomiting  [] Gastroesophageal reflux/heartburn   [] Difficulty swallowing. Genitourinary:  [] Chronic kidney disease   [] Difficult urination  [] Frequent urination   [] Blood in urine Skin:  [] Rashes   [] Ulcers  Psychological:  [] History of anxiety   []  History of major depression.  Physical Examination  There were no vitals filed for this visit. There is no height or weight on file to  calculate BMI. Gen: WD/WN, NAD Head: [place]/AT, No temporalis wasting.  Ear/Nose/Throat: Hearing grossly intact, nares w/o erythema or drainage Eyes: PER, EOMI, sclera nonicteric.  Neck: Supple, no large masses.   Pulmonary:  Good air movement, no audible wheezing bilaterally, no use of accessory muscles.  Cardiac: RRR, no JVD Vascular: right leg is red and warm hyperemic 3+ edema  Ulcers stable  Vessel Right Left  Radial Palpable Palpable  Popliteal Palpable Palpable  PT Not Palpable Not Palpable  DP Not Palpable Not Palpable  Gastrointestinal: Non-distended. No guarding/no peritoneal signs.  Musculoskeletal: M/S 5/5 throughout.  No deformity or atrophy.  Neurologic: CN 2-12 intact. Symmetrical.  Speech is fluent. Motor exam as listed above. Psychiatric: Judgment intact, Mood & affect appropriate for pt's clinical situation. Dermatologic: No rashes or ulcers noted.  No changes consistent with cellulitis. Lymph : No lichenification or skin changes of chronic lymphedema.  CBC Lab Results  Component Value Date   WBC 7.5 12/07/2017   HGB 15.1 12/07/2017   HCT 45.6 12/07/2017   MCV 96.0 12/07/2017   PLT 233 12/07/2017    BMET    Component Value Date/Time   NA 140 12/07/2017 1212   NA 141 08/03/2011 0729   K 3.7 12/07/2017 1212   K 3.6 08/03/2011 0729   CL 102 12/07/2017 1212   CL 105 08/03/2011 0729   CO2 26 12/07/2017 1212   CO2 26 08/03/2011 0729   GLUCOSE 98 12/07/2017 1212   GLUCOSE 149 (H) 08/03/2011 0729   BUN 20 06/13/2018 1008   BUN 16 08/03/2011 0729   CREATININE 0.91 06/13/2018 1008   CREATININE 1.07 08/03/2011 0729   CALCIUM 9.2 12/07/2017 1212   CALCIUM 9.5 08/03/2011 0729   GFRNONAA 56 (L) 06/13/2018 1008   GFRNONAA 48 (L) 08/03/2011 0729   GFRAA >60 06/13/2018 1008   GFRAA 56 (L) 08/03/2011 0729   Estimated Creatinine Clearance: 32.5 mL/min (by C-G formula based on SCr of 0.91 mg/dL).  COAG Lab Results  Component Value Date   INR 1.13 08/12/2017    INR 2.24 05/30/2017    Radiology Dg Ankle Complete Right  Result Date: 06/02/2018 CLINICAL DATA:  Open wound of the posterior right ankle for 1 month. EXAM: RIGHT ANKLE - COMPLETE 3+ VIEW COMPARISON:  None. FINDINGS: There is no evidence of fracture, dislocation, or joint effusion. Mild degenerative joint changes are identified of the midfoot. Soft tissues are unremarkable. IMPRESSION: No plain film evidence of osteomyelitis. Electronically Signed   By: Abelardo Diesel M.D.   On: 06/02/2018 16:04     Assessment/Plan 1. PAD (peripheral artery disease) ([place]) I believe she has moderate reperfusion edema  I will order an ABI   A total of 30 minutes was spent with this patient and her family and greater than 50% was spent in counseling and coordination  of care with the patient.  Discussion included the treatment options for vascular disease including indications for surgery and intervention.  Also discussed is the appropriate timing of treatment.  In addition medical therapy was discussed.  - VAS [place] ABI WITH/WO TBI; Future  2. Mixed hyperlipidemia Continue statin as ordered and reviewed, no changes at this time   3. Essential hypertension Continue antihypertensive medications as already ordered, these medications have been reviewed and there are no changes at this time.   4. Type 2 diabetes mellitus with diabetic peripheral angiopathy without gangrene, with long-term current use of insulin ([place]) Continue hypoglycemic medications as already ordered, these medications have been reviewed and there are no changes at this time.  Hgb A1C to be monitored as already arranged by primary service    [name], MD  06/19/2018 8:26 AM

## 2018-06-22 ENCOUNTER — Encounter (INDEPENDENT_AMBULATORY_CARE_PROVIDER_SITE_OTHER): Payer: Self-pay | Admitting: Vascular Surgery

## 2018-06-23 ENCOUNTER — Encounter: Payer: Medicare Other | Admitting: Physician Assistant

## 2018-06-23 ENCOUNTER — Other Ambulatory Visit: Payer: Self-pay

## 2018-06-23 DIAGNOSIS — E11622 Type 2 diabetes mellitus with other skin ulcer: Secondary | ICD-10-CM | POA: Diagnosis not present

## 2018-06-25 NOTE — Progress Notes (Signed)
ERINA, HAMME ([id number]) Visit Report for 06/23/2018 Chief Complaint Document Details Patient Name: [name], [name] Date of Service: 06/23/2018 2:00 PM Medical Record Number: [id number] Patient Account Number: [id number] Date of Birth/Sex: [birth year] (85 y.o. F) Treating RN: [name] Primary Care Provider: Thereasa Distance Other Clinician: Referring Provider: Thereasa Distance Treating Provider/Extender: Melburn Hake, Jontae Sonier Weeks in Treatment: 3 Information Obtained from: Patient Chief Complaint Right Lower leg Achilles ulcer Electronic Signature(s) Signed: 06/23/2018 5:40:27 PM By: Worthy Keeler PA-C Entered By: Worthy Keeler on 06/23/2018 14:02:16 Trimble, [name] ([id number]) -------------------------------------------------------------------------------- HPI Details Patient Name: [name] Date of Service: 06/23/2018 2:00 PM Medical Record Number: [id number] Patient Account Number: [id number] Date of Birth/Sex: 05/28/1925 (85 y.o. F) Treating RN: [name] Primary Care Provider: Thereasa Distance Other Clinician: Referring Provider: Thereasa Distance Treating Provider/Extender: Melburn Hake, Minyon Billiter Weeks in Treatment: 3 History of Present Illness HPI Description: 06/02/18 patient presents today for initial evaluation our clinic concerning the ulcers that she has on the right Achilles location. 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 She was subsequently supposed to have had a scheduled likely stent placement in December 2019 unfortunately she had a fall with a sub dural hematoma which subsequently had to be evacuated partially at least. For that reason she has never gotten back in with Dr. Delana Meyer to have the vascular procedure. She does not have any appointment scheduled with him at this time. The patient does have a history of chronic kidney disease stage III, congestive heart failure, trophy relation, and at  this point home health who has been coming out has been using [facility]. This is been since she's been home towards the end of December as best we can tell. She does have a hemoglobin A1c of 7.0 which was obtained on 11/18/17. Currently the patient's right lower extremity that showed diminished blood flow according to her arterial study which is dated 02/27/18. At that point her right ABI was 0.89 with a TBI of 0.36 and her left ABI was 1.13 with a TBI of 0.50. Her prior study which was in May on the 13th 2019 showed that she had a right ABI of one and a right TBI of 0.51 with a left ABI of 0.72 and a TBI of 0.50. Obviously since May till now her right leg has diminished as far as the blood flows concerned patient's son who was with her today states that Dr. 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 I do believe that for limb salvage this patient needs to have the above stated procedure with Dr. Delana Meyer as soon as possible. 06/09/18 on evaluation today patient actually appears to be doing about the same in regard to her Achilles ulcer. This actually does have tended exposed however the tenant is remaining moist which is good news that's exactly what we want to see. Fortunately there's no signs of infection she does have an appointment with Dr. Delana Meyer this coming Tuesday, June 13, 2018. 06/16/18 on evaluation today patient actually appears to be doing rather well in regard to her right lower extremity at this point. The Achilles ulcer actually showed signs of being a little bit better as far as the overall appearance of the wound was concerned today. Fortunately there did not appear to be the evidence of infection at this time which is good news. With that being said she did have her angiogram where she did actually  have a significant blockage of the 90% into her lower extremity. Fortunately between the balloon angioplasty followed by stating this was able to be improved to  less than 5% residual stenosis according to Dr. Nino Parsley note that I read. She still may have some limitation into her foot in particular as far as the runoff is concerned but nonetheless in general appears to be doing much better which is great news. Fortunately there is no sign of infection at this time. 06/23/18 on evaluation today patient's wound bed actually appears to be doing about the same although the central portion of the tendon actually appears to be somewhat dry this is definitely not good news. We want to try to keep this moist while allowing the edges of the wound hopefully start to cover the tendon that is the goal. Nonetheless I think that we may need to switch things up a little bit to try to improve the moisture trapping at this point. She does have better blood flow which is great we have applied for EpiFix although unfortunately as of right now this shows to be not covered. I think that we need to try to see about getting this approved as with the tenant expose me to do everything we can to try to get this covering of the skin as soon as possible. Again I'm not even sure that she'd be a candidate for plastic surgery intervention. Electronic Signature(s) Signed: 06/23/2018 5:40:27 PM By: Worthy Keeler PA-C Entered By: Worthy Keeler on 06/23/2018 15:01:55 Jared, [name] ([id number]) -------------------------------------------------------------------------------- Physical Exam Details Patient Name: [name] Date of Service: 06/23/2018 2:00 PM Medical Record Number: [id number] Patient Account Number: [id number] Date of Birth/Sex: 03-Sep-1925 (85 y.o. F) Treating RN: [name] Primary Care Provider: Thereasa Distance Other Clinician: Referring Provider: Thereasa Distance Treating Provider/Extender: Melburn Hake, Blayton Huttner Weeks in Treatment: 3 Constitutional Well-nourished and well-hydrated in no acute distress. Respiratory normal breathing without difficulty. clear to  auscultation bilaterally. Cardiovascular regular rate and rhythm with normal S1, S2. Psychiatric this patient is able to make decisions and demonstrates good insight into disease process. Alert and Oriented x 3. pleasant and cooperative. Notes Patient's wound currently again shows tenant exposure in the central portion of the attending somewhat dry around the edges is moist we need to make sure this doesn't dry out anymore. I'm gonna focus on trying to prevent any moisture loss will get you Xeroform galls see if this will be of benefit for her. No debridement was performed today. Electronic Signature(s) Signed: 06/23/2018 5:40:27 PM By: Worthy Keeler PA-C Entered By: Worthy Keeler on 06/23/2018 15:02:40 Neeson, [name] ([id number]) -------------------------------------------------------------------------------- Physician Orders Details Patient Name: [name] Date of Service: 06/23/2018 2:00 PM Medical Record Number: [id number] Patient Account Number: [id number] Date of Birth/Sex: 09-21-1925 (85 y.o. F) Treating RN: [name] Primary Care Provider: Thereasa Distance Other Clinician: Referring Provider: Thereasa Distance Treating Provider/Extender: Melburn Hake, Laylynn Campanella Weeks in Treatment: 3 Verbal / Phone Orders: No Diagnosis Coding ICD-10 Coding Code Description I73.89 Other specified peripheral vascular diseases L97.818 Non-pressure chronic ulcer of other part of right lower leg with other specified severity N18.3 Chronic kidney disease, stage 3 (moderate) I48.20 Chronic atrial fibrillation, unspecified Wound Cleansing Wound #1 Right Achilles o Clean wound with Normal Saline. Primary Wound Dressing Wound #1 Right Achilles o Hydrogel - or [place] Jelly over exposed tendon to prevent tendon from drying out o Silver Collagen - over entire wound surface o Xeroform - on top of silver  collagen to trap moisture on tendon Secondary Dressing Wound #1 Right Achilles o ABD and  Kerlix/Conform Dressing Change Frequency Wound #1 Right Achilles o Change dressing every other day. Follow-up Appointments Wound #1 Right Achilles o Return Appointment in 1 week. Home Health Wound #1 Right Achilles o Continue Home Health Visits o Home Health Nurse may visit PRN to address patientos wound care needs. o FACE TO FACE ENCOUNTER: MEDICARE and MEDICAID PATIENTS: I certify that this patient is under my care and that I had a face-to-face encounter that meets the physician face-to-face encounter requirements with this patient on this date. The encounter with the patient was in whole or in part for the following MEDICAL CONDITION: (primary reason for [place]) MEDICAL NECESSITY: I certify, that based on my findings, NURSING services are a medically necessary home health service. HOME BOUND STATUS: I certify that my clinical findings support that this patient is homebound (i.e., Due to illness or injury, pt requires aid of supportive devices such as crutches, cane, wheelchairs, walkers, the use of special transportation or the assistance of another person to leave their place of residence. There is a normal inability to leave the home Slayden, Lorea J. ([id number]) and doing so requires considerable and taxing effort. Other absences are for medical reasons / religious services and are infrequent or of short duration when for other reasons). o If current dressing causes regression in wound condition, may D/C ordered dressing product/s and apply Normal Saline Moist Dressing daily until next [place] / Other MD appointment. [place] of regression in wound condition at [phone]. o Please direct any NON-WOUND related issues/requests for orders to patient's Primary Care Physician Electronic Signature(s) Signed: 06/23/2018 5:05:27 PM By: [name] Signed: 06/23/2018 5:40:27 PM By: Worthy Keeler PA-C Entered By: [name] on  06/23/2018 14:44:50 Harwick, [name] ([id number]) -------------------------------------------------------------------------------- Problem List Details Patient Name: [name] Date of Service: 06/23/2018 2:00 PM Medical Record Number: [id number] Patient Account Number: [id number] Date of Birth/Sex: [birth year] (85 y.o. F) Treating RN: [name] Primary Care Provider: Thereasa Distance Other Clinician: Referring Provider: Thereasa Distance Treating Provider/Extender: Melburn Hake, Thimothy Barretta Weeks in Treatment: 3 Active Problems ICD-10 Evaluated Encounter Code Description Active Date Today Diagnosis I73.89 Other specified peripheral vascular diseases 06/02/2018 No Yes L97.818 Non-pressure chronic ulcer of other part of right lower leg 06/02/2018 No Yes with other specified severity N18.3 Chronic kidney disease, stage 3 (moderate) 06/02/2018 No Yes I48.20 Chronic atrial fibrillation, unspecified 06/02/2018 No Yes Inactive Problems Resolved Problems Electronic Signature(s) Signed: 06/23/2018 5:40:27 PM By: Worthy Keeler PA-C Entered By: Worthy Keeler on 06/23/2018 14:02:09 Mendell, [name] ([id number]) -------------------------------------------------------------------------------- Progress Note Details Patient Name: [name] Date of Service: 06/23/2018 2:00 PM Medical Record Number: [id number] Patient Account Number: [id number] Date of Birth/Sex: 09/30/1925 (85 y.o. F) Treating RN: [name] Primary Care Provider: Thereasa Distance Other Clinician: Referring Provider: Thereasa Distance Treating Provider/Extender: Melburn Hake, Antoni Stefan Weeks in Treatment: 3 Subjective Chief Complaint Information obtained from Patient Right Lower leg Achilles ulcer History of Present Illness (HPI) 06/02/18 patient presents today for initial evaluation our clinic concerning the ulcers that she has on the right Achilles location. 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 She was subsequently supposed to have had a scheduled likely stent placement in December 2019 unfortunately she had a fall with a sub dural hematoma which subsequently had to be evacuated partially at least. For  that reason she has never gotten back in with Dr. Delana Meyer to have the vascular procedure. She does not have any appointment scheduled with him at this time. The patient does have a history of chronic kidney disease stage III, congestive heart failure, trophy relation, and at this point home health who has been coming out has been using [facility]. This is been since she's been home towards the end of December as best we can tell. She does have a hemoglobin A1c of 7.0 which was obtained on 11/18/17. Currently the patient's right lower extremity that showed diminished blood flow according to her arterial study which is dated 02/27/18. At that point her right ABI was 0.89 with a TBI of 0.36 and her left ABI was 1.13 with a TBI of 0.50. Her prior study which was in May on the 13th 2019 showed that she had a right ABI of one and a right TBI of 0.51 with a left ABI of 0.72 and a TBI of 0.50. Obviously since May till now her right leg has diminished as far as the blood flows concerned patient's son who was with her today states that Dr. 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 I do believe that for limb salvage this patient needs to have the above stated procedure with Dr. Delana Meyer as soon as possible. 06/09/18 on evaluation today patient actually appears to be doing about the same in regard to her Achilles ulcer. This actually does have tended exposed however the tenant is remaining moist which is good news that's exactly what we want to see. Fortunately there's no signs of infection she does have an appointment with Dr. Delana Meyer this coming Tuesday, June 13, 2018. 06/16/18 on evaluation today patient actually appears to be doing rather well in  regard to her right lower extremity at this point. The Achilles ulcer actually showed signs of being a little bit better as far as the overall appearance of the wound was concerned today. Fortunately there did not appear to be the evidence of infection at this time which is good news. With that being said she did have her angiogram where she did actually have a significant blockage of the 90% into her lower extremity. Fortunately between the balloon angioplasty followed by stating this was able to be improved to less than 5% residual stenosis according to Dr. Nino Parsley note that I read. She still may have some limitation into her foot in particular as far as the runoff is concerned but nonetheless in general appears to be doing much better which is great news. Fortunately there is no sign of infection at this time. 06/23/18 on evaluation today patient's wound bed actually appears to be doing about the same although the central portion of the tendon actually appears to be somewhat dry this is definitely not good news. We want to try to keep this moist while allowing the edges of the wound hopefully start to cover the tendon that is the goal. Nonetheless I think that we may need to switch things up a little bit to try to improve the moisture trapping at this point. She does have better blood flow which is great we have applied for EpiFix although unfortunately as of right now this shows to be not covered. I think that we need to try to see about getting this approved as with the tenant expose me to do everything we can to try to get this covering of the skin as soon as possible.  Again I'm not even sure that she'd be a candidate for plastic surgery intervention. Patient History Information obtained from Patient. Family History Hobby, CATHLEEN YAGI ([id number]) Cancer - Siblings, Heart Disease - Mother,Siblings,Father, Hypertension - Mother,Father,Siblings, Stroke - Father, No family history of Diabetes,  Hereditary Spherocytosis, Kidney Disease, Lung Disease, Seizures, Thyroid Problems, Tuberculosis. Social History Never smoker, Marital Status - Married, Alcohol Use - Never, Drug Use - No History, Caffeine Use - Daily. Medical History Eyes Denies history of Cataracts, Glaucoma Ear/Nose/Mouth/Throat Denies history of Chronic sinus problems/congestion, Middle ear problems Hematologic/Lymphatic Patient has history of Anemia Denies history of Hemophilia, Human Immunodeficiency Virus, Lymphedema, Sickle Cell Disease Respiratory Denies history of Aspiration, Asthma, Chronic Obstructive Pulmonary Disease (COPD), Pneumothorax, Sleep Apnea, Tuberculosis Cardiovascular Patient has history of Arrhythmia - a fib, Congestive Heart Failure, Hypertension Denies history of Angina, Coronary Artery Disease, Deep Vein Thrombosis, Hypotension, Myocardial Infarction, Peripheral Arterial Disease, Peripheral Venous Disease, Phlebitis, Vasculitis Gastrointestinal Denies history of Cirrhosis , Colitis, Crohn s, Hepatitis A, Hepatitis B, Hepatitis C Endocrine Patient has history of Type II Diabetes Denies history of Type I Diabetes Genitourinary Patient has history of End Stage Renal Disease - CKD stage 3 Immunological Denies history of Lupus Erythematosus, Raynaud s, Scleroderma Integumentary (Skin) Denies history of History of Burn, History of pressure wounds Musculoskeletal Patient has history of Osteoarthritis Denies history of Gout, Rheumatoid Arthritis, Osteomyelitis Neurologic Denies history of Dementia, Neuropathy, Quadriplegia, Paraplegia, Seizure Disorder Oncologic Denies history of Received Chemotherapy, Received Radiation Psychiatric Denies history of Anorexia/bulimia, Confinement Anxiety Medical And Surgical History Notes Cardiovascular HLD, mitral insufficiency Musculoskeletal 4 broken ribs Review of Systems (ROS) Constitutional Symptoms (General Health) Denies complaints or  symptoms of Fever, Chills. Respiratory The patient has no complaints or symptoms. Cardiovascular The patient has no complaints or symptoms. Psychiatric The patient has no complaints or symptoms. SELENE, PELTZER ([id number]) Objective Constitutional Well-nourished and well-hydrated in no acute distress. Vitals Time Taken: 2:24 PM, Height: 63 in, Weight: 102 lbs, BMI: 18.1, Temperature: 98.2 F, Pulse: 66 bpm, Respiratory Rate: 16 breaths/min, Blood Pressure: 153/59 mmHg. Respiratory normal breathing without difficulty. clear to auscultation bilaterally. Cardiovascular regular rate and rhythm with normal S1, S2. Psychiatric this patient is able to make decisions and demonstrates good insight into disease process. Alert and Oriented x 3. pleasant and cooperative. General Notes: Patient's wound currently again shows tenant exposure in the central portion of the attending somewhat dry around the edges is moist we need to make sure this doesn't dry out anymore. I'm gonna focus on trying to prevent any moisture loss will get you Xeroform galls see if this will be of benefit for her. No debridement was performed today. Integumentary (Hair, Skin) Wound #1 status is Open. Original cause of wound was Gradually Appeared. The wound is located on the Right Achilles. The wound measures 4cm length x 2.4cm width x 0.3cm depth; 7.54cm^2 area and 2.262cm^3 volume. There is Fat Layer (Subcutaneous Tissue) Exposed exposed. There is no tunneling or undermining noted. There is a medium amount of serosanguineous drainage noted. The wound margin is flat and intact. There is small (1-33%) red granulation within the wound bed. There is a medium (34-66%) amount of necrotic tissue within the wound bed including Eschar and Adherent Slough. The periwound skin appearance exhibited: Erythema. The periwound skin appearance did not exhibit: Callus, Crepitus, Excoriation, Induration, Rash, Scarring, Dry/Scaly, Maceration,  Atrophie Blanche, Cyanosis, Ecchymosis, Hemosiderin Staining, Mottled, Pallor, Rubor. The surrounding wound skin color is noted with erythema which is circumferential. Periwound temperature  was noted as No Abnormality. The periwound has tenderness on palpation. Assessment Active Problems ICD-10 Other specified peripheral vascular diseases Non-pressure chronic ulcer of other part of right lower leg with other specified severity Chronic kidney disease, stage 3 (moderate) Chronic atrial fibrillation, unspecified Rohner, Jaydee J. ([id number]) Plan Wound Cleansing: Wound #1 Right Achilles: Clean wound with Normal Saline. Primary Wound Dressing: Wound #1 Right Achilles: Hydrogel - or [place] Jelly over exposed tendon to prevent tendon from drying out Silver Collagen - over entire wound surface Xeroform - on top of silver collagen to trap moisture on tendon Secondary Dressing: Wound #1 Right Achilles: ABD and Kerlix/Conform Dressing Change Frequency: Wound #1 Right Achilles: Change dressing every other day. Follow-up Appointments: Wound #1 Right Achilles: Return Appointment in 1 week. Home Health: Wound #1 Right Achilles: [place] Nurse may visit PRN to address patient s wound care needs. FACE TO FACE ENCOUNTER: MEDICARE and MEDICAID PATIENTS: I certify that this patient is under my care and that I had a face-to-face encounter that meets the physician face-to-face encounter requirements with this patient on this date. The encounter with the patient was in whole or in part for the following MEDICAL CONDITION: (primary reason for [place]) MEDICAL NECESSITY: I certify, that based on my findings, NURSING services are a medically necessary home health service. HOME BOUND STATUS: I certify that my clinical findings support that this patient is homebound (i.e., Due to illness or injury, pt requires aid of supportive devices such as crutches, cane, wheelchairs,  walkers, the use of special transportation or the assistance of another person to leave their place of residence. There is a normal inability to leave the home and doing so requires considerable and taxing effort. Other absences are for medical reasons / religious services and are infrequent or of short duration when for other reasons). If current dressing causes regression in wound condition, may D/C ordered dressing product/s and apply Normal Saline Moist Dressing daily until next [place] / Other MD appointment. [place] of regression in wound condition at [phone]. Please direct any NON-WOUND related issues/requests for orders to patient's Primary Care Physician My suggestion currently is to give this one more week this is the utilization of Prisma along with [place] jelly to help prevent this from drying out. Such will he will use your form over top of all this in order to prevent again loss of moisture and hopefully show signs of improvement over the next week. The patient is in agreement with plan as is her son. Subsequently if this is not doing significantly better than we may need to consider a referral to plastic surgery to see what they could potentially do although again I'm not sure there's much they could do at this point. Especially in regard to her health status. I discussed this with the sun previously as well. Otherwise will see were things stand in one weeks time. Please see above for specific wound care orders. We will see patient for re-evaluation in 1 week(s) here in the clinic. If anything worsens or changes patient will contact our office for additional recommendations. [place] Physicist, medical) Signed: 06/23/2018 5:40:27 PM By: Worthy Keeler PA-C Entered By: Worthy Keeler on 06/23/2018 15:03:41 Choate, TERRIONNA BRIDWELL ([id number]) JAZZMEN, RESTIVO  ([id number]) -------------------------------------------------------------------------------- ROS/PFSH Details Patient Name: [name] Date of Service: 06/23/2018 2:00 PM Medical Record Number: [id number] Patient Account Number: [id number] Date of Birth/Sex: 02-16-1926 (85 y.o. F) Treating  RN: [name] Primary Care Provider: Thereasa Distance Other Clinician: Referring Provider: Thereasa Distance Treating Provider/Extender: Melburn Hake, Avonne Berkery Weeks in Treatment: 3 Information Obtained From Patient Wound History Do you currently have one or more open woundso Yes How many open wounds do you currently haveo 1 Approximately how long have you had your woundso 2 months How have you been treating your wound(s) until nowo [facility] Has your wound(s) ever healed and then re-openedo No Have you had any lab work done in the past montho No Have you tested positive for an antibiotic resistant organism (MRSA, VRE)o No Have you tested positive for osteomyelitis (bone infection)o No Have you had any tests for circulation on your legso Yes Where was the test doneo [facility] Constitutional Symptoms (General Health) Complaints and Symptoms: Negative for: Fever; Chills Eyes Medical History: Negative for: Cataracts; Glaucoma Ear/Nose/Mouth/Throat Medical History: Negative for: Chronic sinus problems/congestion; Middle ear problems Hematologic/Lymphatic Medical History: Positive for: Anemia Negative for: Hemophilia; Human Immunodeficiency Virus; Lymphedema; Sickle Cell Disease Respiratory Complaints and Symptoms: No Complaints or Symptoms Medical History: Negative for: Aspiration; Asthma; Chronic Obstructive Pulmonary Disease (COPD); Pneumothorax; Sleep Apnea; Tuberculosis Cardiovascular Complaints and Symptoms: No Complaints or Symptoms Boch, Cyniah J. ([id number]) Medical History: Positive for: Arrhythmia - a fib; Congestive Heart Failure; Hypertension Negative for: Angina; Coronary Artery  Disease; Deep Vein Thrombosis; Hypotension; Myocardial Infarction; Peripheral Arterial Disease; Peripheral Venous Disease; Phlebitis; Vasculitis Past Medical History Notes: HLD, mitral insufficiency Gastrointestinal Medical History: Negative for: Cirrhosis ; Colitis; Crohnos; Hepatitis A; Hepatitis B; Hepatitis C Endocrine Medical History: Positive for: Type II Diabetes Negative for: Type I Diabetes Treated with: Insulin Blood sugar tested every day: Yes Tested : Genitourinary Medical History: Positive for: End Stage Renal Disease - CKD stage 3 Immunological Medical History: Negative for: Lupus Erythematosus; Raynaudos; Scleroderma Integumentary (Skin) Medical History: Negative for: History of Burn; History of pressure wounds Musculoskeletal Medical History: Positive for: Osteoarthritis Negative for: Gout; Rheumatoid Arthritis; Osteomyelitis Past Medical History Notes: 4 broken ribs Neurologic Medical History: Negative for: Dementia; Neuropathy; Quadriplegia; Paraplegia; Seizure Disorder Oncologic Medical History: Negative for: Received Chemotherapy; Received Radiation Psychiatric Complaints and Symptoms: No Complaints or Symptoms Medical History: MANESSA, BULEY ([id number]) Negative for: Anorexia/bulimia; Confinement Anxiety Immunizations Pneumococcal Vaccine: Received Pneumococcal Vaccination: Yes Immunization Notes: up to date Implantable Devices No devices added Family and Social History Cancer: Yes - Siblings; Diabetes: No; Heart Disease: Yes - Mother,Siblings,Father; Hereditary Spherocytosis: No; Hypertension: Yes - Mother,Father,Siblings; Kidney Disease: No; Lung Disease: No; Seizures: No; Stroke: Yes - Father; Thyroid Problems: No; Tuberculosis: No; Never smoker; Marital Status - Married; Alcohol Use: Never; Drug Use: No History; Caffeine Use: Daily; Financial Concerns: No; Food, Clothing or Shelter Needs: No; Support System Lacking: No; Transportation  Concerns: No; Advanced Directives: Yes (Not Provided); Patient does not want information on Advanced Directives; Medical Power of Attorney: Yes - Wilburta Milbourn Joaquim Lai (Not Provided) Physician Affirmation I have reviewed and agree with the above information. Electronic Signature(s) Signed: 06/23/2018 5:05:27 PM By: [name] Signed: 06/23/2018 5:40:27 PM By: Worthy Keeler PA-C Entered By: Worthy Keeler on 06/23/2018 15:02:19 Samonte, [name] ([id number]) -------------------------------------------------------------------------------- SuperBill Details Patient Name: [name] Date of Service: 06/23/2018 Medical Record Number: [id number] Patient Account Number: [id number] Date of Birth/Sex: 09/13/1925 (85 y.o. F) Treating RN: [name] Primary Care Provider: Thereasa Distance Other Clinician: Referring Provider: Thereasa Distance Treating Provider/Extender: Melburn Hake, Elan Mcelvain Weeks in Treatment: 3 Diagnosis Coding ICD-10 Codes Code Description I73.89 Other specified peripheral vascular diseases L97.818 Non-pressure chronic ulcer  of other part of right lower leg with other specified severity N18.3 Chronic kidney disease, stage 3 (moderate) I48.20 Chronic atrial fibrillation, unspecified Facility Procedures CPT4 Code: 82518984 Description: 99213 - WOUND CARE VISIT-LEV 3 EST PT Modifier: Quantity: 1 Physician Procedures CPT4 Code Description: 2103128 11886 - WC PHYS LEVEL 4 - EST PT ICD-10 Diagnosis Description I73.89 Other specified peripheral vascular diseases L97.818 Non-pressure chronic ulcer of other part of right lower leg wit N18.3 Chronic kidney disease, stage 3  (moderate) I48.20 Chronic atrial fibrillation, unspecified Modifier: h other specified Quantity: 1 severity Electronic Signature(s) Signed: 06/23/2018 5:40:27 PM By: Worthy Keeler PA-C Entered By: Worthy Keeler on 06/23/2018 15:03:52

## 2018-06-27 NOTE — Progress Notes (Signed)
CYSTAL, SHANNAHAN ([id number]) Visit Report for 06/23/2018 Arrival Information Details Patient Name: [name], [name] Date of Service: 06/23/2018 2:00 PM Medical Record Number: [id number] Patient Account Number: [id number] Date of Birth/Sex: 03/13/1926 (85 y.o. F) Treating RN: Army Melia Primary Care Jovontae Banko: Thereasa Distance Other Clinician: Referring Kaedan Richert: Thereasa Distance Treating Neilah Fulwider/Extender: Melburn Hake, HOYT Weeks in Treatment: 3 Visit Information History Since Last Visit Added or deleted any medications: No Patient Arrived: Wheel Chair Any new allergies or adverse reactions: No Arrival Time: 14:22 Had a fall or experienced change in No Accompanied By: son activities of daily living that may affect Transfer Assistance: None risk of falls: Patient Has Alerts: Yes Signs or symptoms of abuse/neglect since last visito No Patient Alerts: DMII Hospitalized since last visit: No Implantable device outside of the clinic excluding No cellular tissue based products placed in the center since last visit: Has Dressing in Place as Prescribed: Yes Pain Present Now: Yes Electronic Signature(s) Signed: 06/23/2018 3:17:15 PM By: Army Melia Entered By: Army Melia on 06/23/2018 14:23:10 Gavel, [name] ([id number]) -------------------------------------------------------------------------------- Clinic Level of Care Assessment Details Patient Name: [name] Date of Service: 06/23/2018 2:00 PM Medical Record Number: [id number] Patient Account Number: [id number] Date of Birth/Sex: 08/27/1925 (85 y.o. F) Treating RN: [name] Primary Care Oneill Bais: Thereasa Distance Other Clinician: Referring Brigit Doke: Thereasa Distance Treating Afrika Brick/Extender: Melburn Hake, HOYT Weeks in Treatment: 3 Clinic Level of Care Assessment Items TOOL 4 Quantity Score []  - Use when only an EandM is performed on FOLLOW-UP visit 0 ASSESSMENTS - Nursing Assessment / Reassessment X - Reassessment of  Co-morbidities (includes updates in patient status) 1 10 X- 1 5 Reassessment of Adherence to Treatment Plan ASSESSMENTS - Wound and Skin Assessment / Reassessment X - Simple Wound Assessment / Reassessment - one wound 1 5 []  - 0 Complex Wound Assessment / Reassessment - multiple wounds []  - 0 Dermatologic / Skin Assessment (not related to wound area) ASSESSMENTS - Focused Assessment []  - Circumferential Edema Measurements - multi extremities 0 []  - 0 Nutritional Assessment / Counseling / Intervention X- 1 5 Lower Extremity Assessment (monofilament, tuning fork, pulses) []  - 0 Peripheral Arterial Disease Assessment (using hand held doppler) ASSESSMENTS - Ostomy and/or Continence Assessment and Care []  - Incontinence Assessment and Management 0 []  - 0 Ostomy Care Assessment and Management (repouching, etc.) PROCESS - Coordination of Care X - Simple Patient / Family Education for ongoing care 1 15 []  - 0 Complex (extensive) Patient / Family Education for ongoing care X- 1 10 Staff obtains Programmer, systems, Records, Test Results / Process Orders []  - 0 Staff telephones HHA, Nursing Homes / Clarify orders / etc []  - 0 Routine Transfer to another Facility (non-emergent condition) []  - 0 Routine Hospital Admission (non-emergent condition) []  - 0 New Admissions / Biomedical engineer / Ordering NPWT, Apligraf, etc. []  - 0 Emergency Hospital Admission (emergent condition) X- 1 10 Simple Discharge Coordination Simi, Kayelee J. ([id number]) []  - 0 Complex (extensive) Discharge Coordination PROCESS - Special Needs []  - Pediatric / Minor Patient Management 0 []  - 0 Isolation Patient Management []  - 0 Hearing / Language / Visual special needs []  - 0 Assessment of Community assistance (transportation, D/C planning, etc.) []  - 0 Additional assistance / Altered mentation []  - 0 Support Surface(s) Assessment (bed, cushion, seat, etc.) INTERVENTIONS - Wound Cleansing / Measurement X -  Simple Wound Cleansing - one wound 1 5 []  - 0 Complex Wound Cleansing - multiple wounds X- 1 5 Wound Imaging (photographs -  any number of wounds) []  - 0 Wound Tracing (instead of photographs) X- 1 5 Simple Wound Measurement - one wound []  - 0 Complex Wound Measurement - multiple wounds INTERVENTIONS - Wound Dressings X - Small Wound Dressing one or multiple wounds 1 10 []  - 0 Medium Wound Dressing one or multiple wounds []  - 0 Large Wound Dressing one or multiple wounds []  - 0 Application of Medications - topical []  - 0 Application of Medications - injection INTERVENTIONS - Miscellaneous []  - External ear exam 0 []  - 0 Specimen Collection (cultures, biopsies, blood, body fluids, etc.) []  - 0 Specimen(s) / Culture(s) sent or taken to Lab for analysis []  - 0 Patient Transfer (multiple staff / Civil Service fast streamer / Similar devices) []  - 0 Simple Staple / Suture removal (25 or less) []  - 0 Complex Staple / Suture removal (26 or more) []  - 0 Hypo / Hyperglycemic Management (close monitor of Blood Glucose) []  - 0 Ankle / Brachial Index (ABI) - do not check if billed separately X- 1 5 Vital Signs Ortez, Dontasia J. ([id number]) Has the patient been seen at the hospital within the last three years: Yes Total Score: 90 Level Of Care: New/Established - Level 3 Electronic Signature(s) Signed: 06/23/2018 5:05:27 PM By: [name] Entered By: [name] on 06/23/2018 14:45:22 Obi, [name] ([id number]) -------------------------------------------------------------------------------- Encounter Discharge Information Details Patient Name: [name] Date of Service: 06/23/2018 2:00 PM Medical Record Number: [id number] Patient Account Number: [id number] Date of Birth/Sex: [birth year] (85 y.o. F) Treating RN: Cornell Barman Primary Care Jaikob Borgwardt: Thereasa Distance Other Clinician: Referring Ramonia Mcclaran: Thereasa Distance Treating Shawntia Mangal/Extender: Melburn Hake, HOYT Weeks in Treatment:  3 Encounter Discharge Information Items Discharge Condition: Stable Ambulatory Status: Wheelchair Discharge Destination: Home Transportation: Private Auto Accompanied By: son Schedule Follow-up Appointment: Yes Clinical Summary of Care: Electronic Signature(s) Signed: 06/26/2018 5:20:53 PM By: Gretta Cool, BSN, RN, CWS, Kim RN, BSN Entered By: Gretta Cool, BSN, RN, CWS, Kim on 06/23/2018 15:09:53 Kirker, [name] ([id number]) -------------------------------------------------------------------------------- Lower Extremity Assessment Details Patient Name: [name] Date of Service: 06/23/2018 2:00 PM Medical Record Number: [id number] Patient Account Number: [id number] Date of Birth/Sex: 1925-11-07 (85 y.o. F) Treating RN: Army Melia Primary Care Verle Wheeling: Thereasa Distance Other Clinician: Referring Abdifatah Colquhoun: Thereasa Distance Treating Lyn Joens/Extender: Melburn Hake, HOYT Weeks in Treatment: 3 Edema Assessment Assessed: [Left: No] [Right: No] Edema: [Left: N] [Right: o] Vascular Assessment Pulses: Dorsalis Pedis Palpable: [Right:Yes] Posterior Tibial Extremity colors, hair growth, and conditions: Extremity Color: [Right:Red] Hair Growth on Extremity: [Right:No] Temperature of Extremity: [Right:Warm] Capillary Refill: [Right:< 3 seconds] Toe Nail Assessment Left: Right: Thick: Yes Discolored: Yes Deformed: No Improper Length and Hygiene: No Electronic Signature(s) Signed: 06/23/2018 3:17:15 PM By: Army Melia Entered By: Army Melia on 06/23/2018 14:30:02 Alphin, [name] ([id number]) -------------------------------------------------------------------------------- Multi Wound Chart Details Patient Name: [name] Date of Service: 06/23/2018 2:00 PM Medical Record Number: [id number] Patient Account Number: [id number] Date of Birth/Sex: 1925-06-05 (85 y.o. F) Treating RN: [name] Primary Care Envi Eagleson: Thereasa Distance Other Clinician: Referring Noris Kulinski: Thereasa Distance Treating Jerre Vandrunen/Extender: Melburn Hake, HOYT Weeks in Treatment: 3 Vital Signs Height(in): 47 Pulse(bpm): 22 Weight(lbs): 102 Blood Pressure(mmHg): 153/59 Body Mass Index(BMI): 18 Temperature(F): 98.2 Respiratory Rate 16 (breaths/min): Photos: [1:No Photos] [N/A:N/A] Wound Location: [1:Right Achilles] [N/A:N/A] Wounding Event: [1:Gradually Appeared] [N/A:N/A] Primary Etiology: [1:Arterial Insufficiency Ulcer] [N/A:N/A] Secondary Etiology: [1:Diabetic Wound/Ulcer of the Lower Extremity] [N/A:N/A] Comorbid History: [1:Anemia, Arrhythmia, Congestive Heart Failure, Hypertension, Type II Diabetes, End Stage Renal Disease, Osteoarthritis] [N/A:N/A] Date Acquired: [  1:04/01/2018] [N/A:N/A] Weeks of Treatment: [1:3] [N/A:N/A] Wound Status: [1:Open] [N/A:N/A] Pending Amputation on [1:Yes] [N/A:N/A] Presentation: Measurements L x W x D [1:4x2.4x0.3] [N/A:N/A] (cm) Area (cm) : [1:7.54] [N/A:N/A] Volume (cm) : [1:2.262] [N/A:N/A] % Reduction in Area: [1:-57.90%] [N/A:N/A] % Reduction in Volume: [1:-373.20%] [N/A:N/A] Classification: [1:Partial Thickness] [N/A:N/A] Exudate Amount: [1:Medium] [N/A:N/A] Exudate Type: [1:Serosanguineous] [N/A:N/A] Exudate Color: [1:red, brown] [N/A:N/A] Wound Margin: [1:Flat and Intact] [N/A:N/A] Granulation Amount: [1:Small (1-33%)] [N/A:N/A] Granulation Quality: [1:Red] [N/A:N/A] Necrotic Amount: [1:Medium (34-66%)] [N/A:N/A] Necrotic Tissue: [1:Eschar, Adherent Slough] [N/A:N/A] Exposed Structures: [1:Fat Layer (Subcutaneous Tissue) Exposed: Yes Fascia: No Tendon: No Muscle: No] [N/A:N/A] Joint: No Bone: No Epithelialization: None N/A N/A Periwound Skin Texture: Excoriation: No N/A N/A Induration: No Callus: No Crepitus: No Rash: No Scarring: No Periwound Skin Moisture: Maceration: No N/A N/A Dry/Scaly: No Periwound Skin Color: Erythema: Yes N/A N/A Atrophie Blanche: No Cyanosis: No Ecchymosis: No Hemosiderin Staining:  No Mottled: No Pallor: No Rubor: No Erythema Location: Circumferential N/A N/A Temperature: No Abnormality N/A N/A Tenderness on Palpation: Yes N/A N/A Wound Preparation: Ulcer Cleansing: N/A N/A Rinsed/Irrigated with Saline Topical Anesthetic Applied: Other: lidocaine 4% Treatment Notes Electronic Signature(s) Signed: 06/23/2018 5:05:27 PM By: [name] Entered By: [name] on 06/23/2018 14:40:03 Yowell, [name] ([id number]) -------------------------------------------------------------------------------- Multi-Disciplinary Care Plan Details Patient Name: [name] Date of Service: 06/23/2018 2:00 PM Medical Record Number: [id number] Patient Account Number: [id number] Date of Birth/Sex: Jun 03, 1925 (85 y.o. F) Treating RN: [name] Primary Care Amadu Schlageter: Thereasa Distance Other Clinician: Referring Doyle Kunath: Thereasa Distance Treating Pasty Manninen/Extender: Melburn Hake, HOYT Weeks in Treatment: 3 Active Inactive Abuse / Safety / Falls / Self Care Management Nursing Diagnoses: Potential for falls Goals: Patient will not experience any injury related to falls Date Initiated: 06/16/2018 Target Resolution Date: 09/16/2018 Goal Status: Active Interventions: Assess fall risk on admission and as needed Notes: Necrotic Tissue Nursing Diagnoses: Knowledge deficit related to management of necrotic/devitalized tissue Goals: Patient/caregiver will verbalize understanding of reason and process for debridement of necrotic tissue Date Initiated: 06/16/2018 Target Resolution Date: 09/16/2018 Goal Status: Active Interventions: Provide education on necrotic tissue and debridement process Notes: Nutrition Nursing Diagnoses: Imbalanced nutrition Goals: Patient/caregiver agrees to and verbalizes understanding of need to use nutritional supplements and/or vitamins as prescribed Date Initiated: 06/16/2018 Target Resolution Date: 09/16/2018 Goal Status: Active Interventions: Assess  patient nutrition upon admission and as needed per policy SHONTEL, SANTEE ([id number]) Notes: Wound/Skin Impairment Nursing Diagnoses: Impaired tissue integrity Knowledge deficit related to ulceration/compromised skin integrity Goals: Ulcer/skin breakdown will have a volume reduction of 30% by week 4 Date Initiated: 06/02/2018 Target Resolution Date: 07/01/2018 Goal Status: Active Interventions: Assess patient/caregiver ability to obtain necessary supplies Assess patient/caregiver ability to perform ulcer/skin care regimen upon admission and as needed Assess ulceration(s) every visit Notes: Electronic Signature(s) Signed: 06/23/2018 5:05:27 PM By: [name] Entered By: [name] on 06/23/2018 14:39:30 Wedeking, [name] ([id number]) -------------------------------------------------------------------------------- Pain Assessment Details Patient Name: [name] Date of Service: 06/23/2018 2:00 PM Medical Record Number: [id number] Patient Account Number: [id number] Date of Birth/Sex: [birth year] (85 y.o. F) Treating RN: Army Melia Primary Care Tawney Vanorman: Thereasa Distance Other Clinician: Referring Mykelle Cockerell: Thereasa Distance Treating Aubriel Khanna/Extender: Melburn Hake, HOYT Weeks in Treatment: 3 Active Problems Location of Pain Severity and Description of Pain Patient Has Paino Yes Site Locations Pain Location: Pain in Ulcers With Dressing Change: Yes Rate the pain. Current Pain Level: 6 Pain Management and Medication Current Pain Management: Electronic Signature(s) Signed: 06/23/2018 3:17:15 PM By: Army Melia Entered By:  Army Melia on 06/23/2018 14:23:22 Sowle, MARIBELL DEMEO ([id number]) -------------------------------------------------------------------------------- Patient/Caregiver Education Details Patient Name: [name] Date of Service: 06/23/2018 2:00 PM Medical Record Number: [id number] Patient Account Number: [id number] Date of Birth/Gender: [birth year] (85 y.o.  F) Treating RN: [name] Primary Care Physician: Thereasa Distance Other Clinician: Referring Physician: Thereasa Distance Treating Physician/Extender: Sharalyn Ink in Treatment: 3 Education Assessment Education Provided To: Patient and Caregiver Education Topics Provided Nutrition: Handouts: Other: increased fluids and protein for wound healing Methods: Explain/Verbal Responses: State content correctly Electronic Signature(s) Signed: 06/23/2018 5:05:27 PM By: [name] Entered By: [name] on 06/23/2018 14:49:16 Vandevelde, [name] ([id number]) -------------------------------------------------------------------------------- Wound Assessment Details Patient Name: [name] Date of Service: 06/23/2018 2:00 PM Medical Record Number: [id number] Patient Account Number: [id number] Date of Birth/Sex: November 13, 1925 (85 y.o. 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 Wounding Event: Gradually Appeared Etiology: Date Acquired: 04/01/2018 Wound Open Weeks Of Treatment: 3 Status: Clustered Wound: No Comorbid Anemia, Arrhythmia, Congestive Heart Failure, Pending Amputation On Presentation History: Hypertension, Type II Diabetes, End Stage Renal Disease, Osteoarthritis Photos Photo Uploaded By: Army Melia on 06/23/2018 15:16:29 Wound Measurements Length: (cm) 4 Width: (cm) 2.4 Depth: (cm) 0.3 Area: (cm) 7.54 Volume: (cm) 2.262 % Reduction in Area: -57.9% % Reduction in Volume: -373.2% Epithelialization: None Tunneling: No Undermining: No Wound Description Classification: Partial Thickness Wound Margin: Flat and Intact Exudate Amount:  Medium Exudate Type: Serosanguineous Exudate Color: red, brown Foul Odor After Cleansing: No Slough/Fibrino Yes Wound Bed Granulation Amount: Small (1-33%) Exposed Structure Granulation Quality: Red Fascia Exposed: No Necrotic Amount: Medium (34-66%) Fat Layer (Subcutaneous Tissue) Exposed: Yes Necrotic Quality: Eschar, Adherent Slough Tendon Exposed: No Muscle Exposed: No Joint Exposed: No Bone Exposed: No Talmadge, Gracen J. ([id number]) Periwound Skin Texture Texture Color No Abnormalities Noted: No No Abnormalities Noted: No Callus: No Atrophie Blanche: No Crepitus: No Cyanosis: No Excoriation: No Ecchymosis: No Induration: No Erythema: Yes Rash: No Erythema Location: Circumferential Scarring: No Hemosiderin Staining: No Mottled: No Moisture Pallor: No No Abnormalities Noted: No Rubor: No Dry / Scaly: No Maceration: No Temperature / Pain Temperature: No Abnormality Tenderness on Palpation: Yes Wound Preparation Ulcer Cleansing: Rinsed/Irrigated with Saline Topical Anesthetic Applied: Other: lidocaine 4%, Treatment Notes Wound #1 (Right Achilles) 4. Dressing Applied: Prisma Ag Notes Prisma Ag, hydrogel, xeroform, telfa, abd, conform to secure Electronic Signature(s) Signed: 06/23/2018 3:17:15 PM By: Army Melia Entered By: Army Melia on 06/23/2018 14:29:22 Lagerstrom, [name] ([id number]) -------------------------------------------------------------------------------- Vitals Details Patient Name: [name] Date of Service: 06/23/2018 2:00 PM Medical Record Number: [id number] Patient Account Number: [id number] Date of Birth/Sex: March 20, 1926 (85 y.o. F) Treating RN: Army Melia Primary Care Notnamed Croucher: Thereasa Distance Other Clinician: Referring Siedah Sedor: Thereasa Distance Treating Kimball Appleby/Extender: Melburn Hake, HOYT Weeks in Treatment: 3 Vital Signs Time Taken: 14:24 Temperature (F): 98.2 Height (in): 63 Pulse (bpm): 66 Weight (lbs): 102 Respiratory  Rate (breaths/min): 16 Body Mass Index (BMI): 18.1 Blood Pressure (mmHg): 153/59 Reference Range: 80 - 120 mg / dl Electronic Signature(s) Signed: 06/23/2018 3:17:15 PM By: Army Melia Entered By: Army Melia on 06/23/2018 14:24:27

## 2018-06-29 ENCOUNTER — Other Ambulatory Visit: Payer: Self-pay

## 2018-06-29 ENCOUNTER — Ambulatory Visit (INDEPENDENT_AMBULATORY_CARE_PROVIDER_SITE_OTHER): Payer: Medicare Other

## 2018-06-29 ENCOUNTER — Encounter (INDEPENDENT_AMBULATORY_CARE_PROVIDER_SITE_OTHER): Payer: Self-pay | Admitting: Nurse Practitioner

## 2018-06-29 ENCOUNTER — Ambulatory Visit (INDEPENDENT_AMBULATORY_CARE_PROVIDER_SITE_OTHER): Payer: Medicare Other | Admitting: Nurse Practitioner

## 2018-06-29 VITALS — BP 163/79 | HR 97 | Resp 16

## 2018-06-29 DIAGNOSIS — I10 Essential (primary) hypertension: Secondary | ICD-10-CM

## 2018-06-29 DIAGNOSIS — I739 Peripheral vascular disease, unspecified: Secondary | ICD-10-CM

## 2018-06-29 DIAGNOSIS — E782 Mixed hyperlipidemia: Secondary | ICD-10-CM

## 2018-06-29 DIAGNOSIS — Z794 Long term (current) use of insulin: Secondary | ICD-10-CM

## 2018-06-29 DIAGNOSIS — Z79899 Other long term (current) drug therapy: Secondary | ICD-10-CM

## 2018-06-29 DIAGNOSIS — E1151 Type 2 diabetes mellitus with diabetic peripheral angiopathy without gangrene: Secondary | ICD-10-CM

## 2018-06-29 DIAGNOSIS — Z7984 Long term (current) use of oral hypoglycemic drugs: Secondary | ICD-10-CM

## 2018-06-30 ENCOUNTER — Encounter: Payer: Medicare Other | Admitting: Physician Assistant

## 2018-06-30 DIAGNOSIS — E11622 Type 2 diabetes mellitus with other skin ulcer: Secondary | ICD-10-CM | POA: Diagnosis not present

## 2018-07-01 NOTE — Progress Notes (Signed)
[name] ([id number]) Visit Report for 06/30/2018 Chief Complaint Document Details Patient Name: [name] Date of Service: 06/30/2018 2:15 PM Medical Record Number: [id number] Patient Account Number: [id number] Date of Birth/Sex: 1925/10/25 (85 y.o. F) Treating RN: Cornell Barman Primary Care Provider: Thereasa Distance Other Clinician: Referring Provider: Thereasa Distance Treating Provider/Extender: Melburn Hake, HOYT Weeks in Treatment: 4 Information Obtained from: Patient Chief Complaint Right Lower leg Achilles ulcer Electronic Signature(s) Signed: 06/30/2018 3:41:29 PM By: Worthy Keeler PA-C Entered By: Worthy Keeler on 06/30/2018 14:20:57 [name] ([id number]) -------------------------------------------------------------------------------- HPI Details Patient Name: [name] Date of Service: 06/30/2018 2:15 PM Medical Record Number: [id number] Patient Account Number: [id number] Date of Birth/Sex: 1926/04/04 (85 y.o. F) Treating RN: Cornell Barman Primary Care Provider: Thereasa Distance Other Clinician: Referring Provider: Thereasa Distance Treating Provider/Extender: Melburn Hake, HOYT Weeks in Treatment: 4 History of Present Illness HPI Description: 06/02/18 patient presents today for initial evaluation our clinic concerning the ulcers that she has on the right Achilles location. 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 She was subsequently supposed to have had a scheduled likely stent placement in December 2019 unfortunately she had a fall with a sub dural hematoma which subsequently had to be evacuated partially at least. For that reason she has never gotten back in with Dr. Delana Meyer to have the vascular procedure. She does not have any appointment scheduled with him at this time. The patient does have a history of chronic kidney disease stage III, congestive heart failure, trophy relation, and at this  point home health who has been coming out has been using [facility]. This is been since she's been home towards the end of December as best we can tell. She does have a hemoglobin A1c of 7.0 which was obtained on 11/18/17. Currently the patient's right lower extremity that showed diminished blood flow according to her arterial study which is dated 02/27/18. At that point her right ABI was 0.89 with a TBI of 0.36 and her left ABI was 1.13 with a TBI of 0.50. Her prior study which was in May on the 13th 2019 showed that she had a right ABI of one and a right TBI of 0.51 with a left ABI of 0.72 and a TBI of 0.50. Obviously since May till now her right leg has diminished as far as the blood flows concerned patient's son who was with her today states that Dr. 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 I do believe that for limb salvage this patient needs to have the above stated procedure with Dr. Delana Meyer as soon as possible. 06/09/18 on evaluation today patient actually appears to be doing about the same in regard to her Achilles ulcer. This actually does have tended exposed however the tenant is remaining moist which is good news that's exactly what we want to see. Fortunately there's no signs of infection she does have an appointment with Dr. Delana Meyer this coming Tuesday, June 13, 2018. 06/16/18 on evaluation today patient actually appears to be doing rather well in regard to her right lower extremity at this point. The Achilles ulcer actually showed signs of being a little bit better as far as the overall appearance of the wound was concerned today. Fortunately there did not appear to be the evidence of infection at this time which is good news. With that being said she did have her angiogram where she did actually  have a significant blockage of the 90% into her lower extremity. Fortunately between the balloon angioplasty followed by stating this was able to be improved to less  than 5% residual stenosis according to Dr. Nino Parsley note that I read. She still may have some limitation into her foot in particular as far as the runoff is concerned but nonetheless in general appears to be doing much better which is great news. Fortunately there is no sign of infection at this time. 06/23/18 on evaluation today patient's wound bed actually appears to be doing about the same although the central portion of the tendon actually appears to be somewhat dry this is definitely not good news. We want to try to keep this moist while allowing the edges of the wound hopefully start to cover the tendon that is the goal. Nonetheless I think that we may need to switch things up a little bit to try to improve the moisture trapping at this point. She does have better blood flow which is great we have applied for EpiFix although unfortunately as of right now this shows to be not covered. I think that we need to try to see about getting this approved as with the tenant expose me to do everything we can to try to get this covering of the skin as soon as possible. Again I'm not even sure that she'd be a candidate for plastic surgery intervention. 06/30/18 on evaluation today patient actually appears to be doing about the same in regard to her wound on the right Achilles region. The Achilles tendon is still exposed there does not appear to be any tissue growth around the edge while the tendon itself does not appear to be quite as dry as it was previous there's definitely necrosis of the tenant beginning to be noted unfortunately. For that reason I think that we really need to make an appointment for her to see a specialist in order to discuss the options with her in regard to this one. Fortunately there does not appear to be any signs of systemic infection or even local infection for that matter. Electronic Signature(s) Signed: 06/30/2018 3:41:29 PM By: Candi Leash, Sabirin Lenna Sciara  ([id number]) Entered By: Worthy Keeler on 06/30/2018 15:39:25 [name] ([id number]) -------------------------------------------------------------------------------- Physical Exam Details Patient Name: [name] Date of Service: 06/30/2018 2:15 PM Medical Record Number: [id number] Patient Account Number: [id number] Date of Birth/Sex: [birth year] (85 y.o. F) Treating RN: Cornell Barman Primary Care Provider: Thereasa Distance Other Clinician: Referring Provider: Thereasa Distance Treating Provider/Extender: Melburn Hake, HOYT Weeks in Treatment: 4 Constitutional Well-nourished and well-hydrated in no acute distress. Respiratory normal breathing without difficulty. clear to auscultation bilaterally. Cardiovascular regular rate and rhythm with normal S1, S2. Psychiatric this patient is able to make decisions and demonstrates good insight into disease process. Alert and Oriented x 3. pleasant and cooperative. Notes Patient's wound bed currently shows evidence of tendon exposure. This does not appear to be as dry as it was last week though unfortunately it is still exposed and there is nothing in the process noted on the surface of the tendon which is not good news. Nonetheless I really do not see any skin or tissue growth around the edge of the wound unfortunately. I think that she is going to likely need a surgical consult to discuss options for me treatment standpoint. Electronic Signature(s) Signed: 06/30/2018 3:41:29 PM By: Worthy Keeler PA-C Entered By: Worthy Keeler on 06/30/2018 15:40:14 Moorer, Arnold Carlson ([id number]) --------------------------------------------------------------------------------  Physician Orders Details Patient Name: [name], [name] Date of Service: 06/30/2018 2:15 PM Medical Record Number: [id number] Patient Account Number: [id number] Date of Birth/Sex: 1925-09-13 (85 y.o. F) Treating RN: Cornell Barman Primary Care Provider: Thereasa Distance Other  Clinician: Referring Provider: Thereasa Distance Treating Provider/Extender: Melburn Hake, HOYT Weeks in Treatment: 4 Verbal / Phone Orders: No Diagnosis Coding ICD-10 Coding Code Description I73.89 Other specified peripheral vascular diseases L97.818 Non-pressure chronic ulcer of other part of right lower leg with other specified severity N18.3 Chronic kidney disease, stage 3 (moderate) I48.20 Chronic atrial fibrillation, unspecified Wound Cleansing Wound #1 Right Achilles o Clean wound with Normal Saline. Primary Wound Dressing Wound #1 Right Achilles o Hydrogel - or [place] Jelly over exposed tendon to prevent tendon from drying out o Silver Collagen - over entire wound surface o Xeroform - on top of silver collagen to trap moisture on tendon Secondary Dressing Wound #1 Right Achilles o ABD and Kerlix/Conform Dressing Change Frequency Wound #1 Right Achilles o Change dressing every other day. Follow-up Appointments Wound #1 Right Achilles o Return Appointment in 1 week. Home Health Wound #1 Right Achilles o Continue Home Health Visits o Home Health Nurse may visit PRN to address patientos wound care needs. o FACE TO FACE ENCOUNTER: MEDICARE and MEDICAID PATIENTS: I certify that this patient is under my care and that I had a face-to-face encounter that meets the physician face-to-face encounter requirements with this patient on this date. The encounter with the patient was in whole or in part for the following MEDICAL CONDITION: (primary reason for [place]) MEDICAL NECESSITY: I certify, that based on my findings, NURSING services are a medically necessary home health service. HOME BOUND STATUS: I certify that my clinical findings support that this patient is homebound (i.e., Due to illness or injury, pt requires aid of supportive devices such as crutches, cane, wheelchairs, walkers, the use of special transportation or the assistance of another person to  leave their place of residence. There is a normal inability to leave the home [place], Manar J. ([id number]) and doing so requires considerable and taxing effort. Other absences are for medical reasons / religious services and are infrequent or of short duration when for other reasons). o If current dressing causes regression in wound condition, may D/C ordered dressing product/s and apply Normal Saline Moist Dressing daily until next [place] / Other MD appointment. [place] of regression in wound condition at [phone]. o Please direct any NON-WOUND related issues/requests for orders to patient's Primary Care Physician [place] ([place]) Electronic Signature(s) Signed: 06/30/2018 3:41:29 PM By: Worthy Keeler PA-C Signed: 06/30/2018 3:56:51 PM By: Gretta Cool, BSN, RN, CWS, Kim RN, BSN Entered By: Gretta Cool, BSN, RN, CWS, Kim on 06/30/2018 14:38:01 Mcnamee, Arnold Carlson ([id number]) -------------------------------------------------------------------------------- Problem List Details Patient Name: [name] Date of Service: 06/30/2018 2:15 PM Medical Record Number: [id number] Patient Account Number: [id number] Date of Birth/Sex: [birth year] (85 y.o. F) Treating RN: Cornell Barman Primary Care Provider: Thereasa Distance Other Clinician: Referring Provider: Thereasa Distance Treating Provider/Extender: Melburn Hake, HOYT Weeks in Treatment: 4 Active Problems ICD-10 Evaluated Encounter Code Description Active Date Today Diagnosis I73.89 Other specified peripheral vascular diseases 06/02/2018 No Yes L97.818 Non-pressure chronic ulcer of other part of right lower leg 06/02/2018 No Yes with other specified severity N18.3 Chronic kidney disease, stage 3 (moderate) 06/02/2018 No Yes I48.20 Chronic atrial fibrillation, unspecified 06/02/2018 No Yes Inactive Problems Resolved Problems Electronic Signature(s) Signed: 06/30/2018  3:41:29 PM  By: Worthy Keeler PA-C Entered By: Worthy Keeler on 06/30/2018 14:20:52 Milks, Arnold Carlson ([id number]) -------------------------------------------------------------------------------- Progress Note Details Patient Name: [name] Date of Service: 06/30/2018 2:15 PM Medical Record Number: [id number] Patient Account Number: [id number] Date of Birth/Sex: 1925/09/07 (85 y.o. F) Treating RN: Cornell Barman Primary Care Provider: Thereasa Distance Other Clinician: Referring Provider: Thereasa Distance Treating Provider/Extender: Melburn Hake, HOYT Weeks in Treatment: 4 Subjective Chief Complaint Information obtained from Patient Right Lower leg Achilles ulcer History of Present Illness (HPI) 06/02/18 patient presents today for initial evaluation our clinic concerning the ulcers that she has on the right Achilles location. 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 She was subsequently supposed to have had a scheduled likely stent placement in December 2019 unfortunately she had a fall with a sub dural hematoma which subsequently had to be evacuated partially at least. For that reason she has never gotten back in with Dr. Delana Meyer to have the vascular procedure. She does not have any appointment scheduled with him at this time. The patient does have a history of chronic kidney disease stage III, congestive heart failure, trophy relation, and at this point home health who has been coming out has been using [facility]. This is been since she's been home towards the end of December as best we can tell. She does have a hemoglobin A1c of 7.0 which was obtained on 11/18/17. Currently the patient's right lower extremity that showed diminished blood flow according to her arterial study which is dated 02/27/18. At that point her right ABI was 0.89 with a TBI of 0.36 and her left ABI was 1.13 with a TBI of 0.50. Her prior study which was in May on the  13th 2019 showed that she had a right ABI of one and a right TBI of 0.51 with a left ABI of 0.72 and a TBI of 0.50. Obviously since May till now her right leg has diminished as far as the blood flows concerned patient's son who was with her today states that Dr. 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 I do believe that for limb salvage this patient needs to have the above stated procedure with Dr. Delana Meyer as soon as possible. 06/09/18 on evaluation today patient actually appears to be doing about the same in regard to her Achilles ulcer. This actually does have tended exposed however the tenant is remaining moist which is good news that's exactly what we want to see. Fortunately there's no signs of infection she does have an appointment with Dr. Delana Meyer this coming Tuesday, June 13, 2018. 06/16/18 on evaluation today patient actually appears to be doing rather well in regard to her right lower extremity at this point. The Achilles ulcer actually showed signs of being a little bit better as far as the overall appearance of the wound was concerned today. Fortunately there did not appear to be the evidence of infection at this time which is good news. With that being said she did have her angiogram where she did actually have a significant blockage of the 90% into her lower extremity. Fortunately between the balloon angioplasty followed by stating this was able to be improved to less than 5% residual stenosis according to Dr. Nino Parsley note that I read. She still may have some limitation into her foot in particular as far as the runoff is concerned but nonetheless in general appears to be  doing much better which is great news. Fortunately there is no sign of infection at this time. 06/23/18 on evaluation today patient's wound bed actually appears to be doing about the same although the central portion of the tendon actually appears to be somewhat dry this is definitely not  good news. We want to try to keep this moist while allowing the edges of the wound hopefully start to cover the tendon that is the goal. Nonetheless I think that we may need to switch things up a little bit to try to improve the moisture trapping at this point. She does have better blood flow which is great we have applied for EpiFix although unfortunately as of right now this shows to be not covered. I think that we need to try to see about getting this approved as with the tenant expose me to do everything we can to try to get this covering of the skin as soon as possible. Again I'm not even sure that she'd be a candidate for plastic surgery intervention. 06/30/18 on evaluation today patient actually appears to be doing about the same in regard to her wound on the right Achilles region. The Achilles tendon is still exposed there does not appear to be any tissue growth around the edge while the tendon itself does not appear to be quite as dry as it was previous there's definitely necrosis of the tenant beginning to be noted unfortunately. For that reason I think that we really need to make an appointment for her to see a specialist in order to discuss the options with her in regard to this one. Fortunately there does not appear to be any signs of systemic infection or even local Fait, Marka J. ([id number]) infection for that matter. Patient History Information obtained from Patient. Family History Cancer - Siblings, Heart Disease - Mother,Siblings,Father, Hypertension - Mother,Father,Siblings, Stroke - Father, No family history of Diabetes, Hereditary Spherocytosis, Kidney Disease, Lung Disease, Seizures, Thyroid Problems, Tuberculosis. Social History Never smoker, Marital Status - Married, Alcohol Use - Never, Drug Use - No History, Caffeine Use - Daily. Medical History Eyes Denies history of Cataracts, Glaucoma Ear/Nose/Mouth/Throat Denies history of Chronic sinus problems/congestion,  Middle ear problems Hematologic/Lymphatic Patient has history of Anemia Denies history of Hemophilia, Human Immunodeficiency Virus, Lymphedema, Sickle Cell Disease Respiratory Denies history of Aspiration, Asthma, Chronic Obstructive Pulmonary Disease (COPD), Pneumothorax, Sleep Apnea, Tuberculosis Cardiovascular Patient has history of Arrhythmia - a fib, Congestive Heart Failure, Hypertension Denies history of Angina, Coronary Artery Disease, Deep Vein Thrombosis, Hypotension, Myocardial Infarction, Peripheral Arterial Disease, Peripheral Venous Disease, Phlebitis, Vasculitis Gastrointestinal Denies history of Cirrhosis , Colitis, Crohn s, Hepatitis A, Hepatitis B, Hepatitis C Endocrine Patient has history of Type II Diabetes Denies history of Type I Diabetes Genitourinary Patient has history of End Stage Renal Disease - CKD stage 3 Immunological Denies history of Lupus Erythematosus, Raynaud s, Scleroderma Integumentary (Skin) Denies history of History of Burn, History of pressure wounds Musculoskeletal Patient has history of Osteoarthritis Denies history of Gout, Rheumatoid Arthritis, Osteomyelitis Neurologic Denies history of Dementia, Neuropathy, Quadriplegia, Paraplegia, Seizure Disorder Oncologic Denies history of Received Chemotherapy, Received Radiation Psychiatric Denies history of Anorexia/bulimia, Confinement Anxiety Medical And Surgical History Notes Cardiovascular HLD, mitral insufficiency Musculoskeletal 4 broken ribs Review of Systems (ROS) Constitutional Symptoms (General Health) Denies complaints or symptoms of Fever, Chills. Respiratory Raspberry, Anyela J. ([id number]) The patient has no complaints or symptoms. Cardiovascular The patient has no complaints or symptoms. Psychiatric The patient has no complaints or symptoms.  Objective Constitutional Well-nourished and well-hydrated in no acute distress. Vitals Time Taken: 2:13 PM, Height: 63 in, Weight: 102  lbs, BMI: 18.1, Temperature: 98.4 F, Pulse: 85 bpm, Respiratory Rate: 16 breaths/min, Blood Pressure: 143/80 mmHg. Respiratory normal breathing without difficulty. clear to auscultation bilaterally. Cardiovascular regular rate and rhythm with normal S1, S2. Psychiatric this patient is able to make decisions and demonstrates good insight into disease process. Alert and Oriented x 3. pleasant and cooperative. General Notes: Patient's wound bed currently shows evidence of tendon exposure. This does not appear to be as dry as it was last week though unfortunately it is still exposed and there is nothing in the process noted on the surface of the tendon which is not good news. Nonetheless I really do not see any skin or tissue growth around the edge of the wound unfortunately. I think that she is going to likely need a surgical consult to discuss options for me treatment standpoint. Integumentary (Hair, Skin) Wound #1 status is Open. Original cause of wound was Gradually Appeared. The wound is located on the Right Achilles. The wound measures 3.9cm length x 2.7cm width x 0.3cm depth; 8.27cm^2 area and 2.481cm^3 volume. There is Fat Layer (Subcutaneous Tissue) Exposed exposed. There is no tunneling or undermining noted. There is a medium amount of serosanguineous drainage noted. The wound margin is flat and intact. There is small (1-33%) red granulation within the wound bed. There is a medium (34-66%) amount of necrotic tissue within the wound bed including Eschar and Adherent Slough. The periwound skin appearance exhibited: Callus, Dry/Scaly, Erythema. The periwound skin appearance did not exhibit: Crepitus, Excoriation, Induration, Rash, Scarring, Maceration, Atrophie Blanche, Cyanosis, Ecchymosis, Hemosiderin Staining, Mottled, Pallor, Rubor. The surrounding wound skin color is noted with erythema which is circumferential. Periwound temperature was noted as No Abnormality. The periwound has  tenderness on palpation. Assessment Active Problems ICD-10 [name], [name]. ([id number]) Other specified peripheral vascular diseases Non-pressure chronic ulcer of other part of right lower leg with other specified severity Chronic kidney disease, stage 3 (moderate) Chronic atrial fibrillation, unspecified Plan Wound Cleansing: Wound #1 Right Achilles: Clean wound with Normal Saline. Primary Wound Dressing: Wound #1 Right Achilles: Hydrogel - or [place] Jelly over exposed tendon to prevent tendon from drying out Silver Collagen - over entire wound surface Xeroform - on top of silver collagen to trap moisture on tendon Secondary Dressing: Wound #1 Right Achilles: ABD and Kerlix/Conform Dressing Change Frequency: Wound #1 Right Achilles: Change dressing every other day. Follow-up Appointments: Wound #1 Right Achilles: Return Appointment in 1 week. Home Health: Wound #1 Right Achilles: [place] Nurse may visit PRN to address patient s wound care needs. FACE TO FACE ENCOUNTER: MEDICARE and MEDICAID PATIENTS: I certify that this patient is under my care and that I had a face-to-face encounter that meets the physician face-to-face encounter requirements with this patient on this date. The encounter with the patient was in whole or in part for the following MEDICAL CONDITION: (primary reason for [place]) MEDICAL NECESSITY: I certify, that based on my findings, NURSING services are a medically necessary home health service. HOME BOUND STATUS: I certify that my clinical findings support that this patient is homebound (i.e., Due to illness or injury, pt requires aid of supportive devices such as crutches, cane, wheelchairs, walkers, the use of special transportation or the assistance of another person to leave their place of residence. There is a normal inability to leave the home and doing so  requires considerable and taxing effort. Other absences are for  medical reasons / religious services and are infrequent or of short duration when for other reasons). If current dressing causes regression in wound condition, may D/C ordered dressing product/s and apply Normal Saline Moist Dressing daily until next [place] / Other MD appointment. [place] of regression in wound condition at [phone]. Please direct any NON-WOUND related issues/requests for orders to patient's Primary Care Physician Consults ordered were: [place] [facility]) My suggestion currently is to go ahead and see about making a referral to orthopedics for them to discuss options in regard to her treatment. She's in agreement that plan. We will subsequently see her back for reevaluation in one weeks time otherwise to see were things stand we will continue with the Current wound care measures in the interim. If anything changes or worsens she let me know. I'm just not sure that this is gonna have a chance to heal short of some kind of surgical intervention I'm also unsure of her health status as far as the potential for surgery or not but I will leave that to the surgeon to determine in conversation with the patient and her son. [name], [name] ([id number]) Please see above for specific wound care orders. We will see patient for re-evaluation in 1 week(s) here in the clinic. If anything worsens or changes patient will contact our office for additional recommendations. Electronic Signature(s) Signed: 06/30/2018 3:41:29 PM By: Worthy Keeler PA-C Entered By: Worthy Keeler on 06/30/2018 15:40:59 Varricchio, Arnold Carlson ([id number]) -------------------------------------------------------------------------------- ROS/PFSH Details Patient Name: [name] Date of Service: 06/30/2018 2:15 PM Medical Record Number: [id number] Patient Account Number: [id number] Date of Birth/Sex: [birth year] (85 y.o. F) Treating RN: Cornell Barman Primary  Care Provider: Thereasa Distance Other Clinician: Referring Provider: Thereasa Distance Treating Provider/Extender: Melburn Hake, HOYT Weeks in Treatment: 4 Information Obtained From Patient Wound History Do you currently have one or more open woundso Yes How many open wounds do you currently haveo 1 Approximately how Carlson have you had your woundso 2 months How have you been treating your wound(s) until nowo medihoney Has your wound(s) ever healed and then re-openedo No Have you had any lab work done in the past montho No Have you tested positive for an antibiotic resistant organism (MRSA, VRE)o No Have you tested positive for osteomyelitis (bone infection)o No Have you had any tests for circulation on your legso Yes Where was the test doneo [facility] Constitutional Symptoms (General Health) Complaints and Symptoms: Negative for: Fever; Chills Eyes Medical History: Negative for: Cataracts; Glaucoma Ear/Nose/Mouth/Throat Medical History: Negative for: Chronic sinus problems/congestion; Middle ear problems Hematologic/Lymphatic Medical History: Positive for: Anemia Negative for: Hemophilia; Human Immunodeficiency Virus; Lymphedema; Sickle Cell Disease Respiratory Complaints and Symptoms: No Complaints or Symptoms Medical History: Negative for: Aspiration; Asthma; Chronic Obstructive Pulmonary Disease (COPD); Pneumothorax; Sleep Apnea; Tuberculosis Cardiovascular Complaints and Symptoms: No Complaints or Symptoms Drolet, Deion J. ([id number]) Medical History: Positive for: Arrhythmia - a fib; Congestive Heart Failure; Hypertension Negative for: Angina; Coronary Artery Disease; Deep Vein Thrombosis; Hypotension; Myocardial Infarction; Peripheral Arterial Disease; Peripheral Venous Disease; Phlebitis; Vasculitis Past Medical History Notes: HLD, mitral insufficiency Gastrointestinal Medical History: Negative for: Cirrhosis ; Colitis; Crohnos; Hepatitis A; Hepatitis B; Hepatitis  C Endocrine Medical History: Positive for: Type II Diabetes Negative for: Type I Diabetes Treated with: Insulin Blood sugar tested every day: Yes Tested : Genitourinary Medical History: Positive for: End Stage Renal Disease -  CKD stage 3 Immunological Medical History: Negative for: Lupus Erythematosus; Raynaudos; Scleroderma Integumentary (Skin) Medical History: Negative for: History of Burn; History of pressure wounds Musculoskeletal Medical History: Positive for: Osteoarthritis Negative for: Gout; Rheumatoid Arthritis; Osteomyelitis Past Medical History Notes: 4 broken ribs Neurologic Medical History: Negative for: Dementia; Neuropathy; Quadriplegia; Paraplegia; Seizure Disorder Oncologic Medical History: Negative for: Received Chemotherapy; Received Radiation Psychiatric Complaints and Symptoms: No Complaints or Symptoms Medical History: SYNTHIA, [employer] ([id number]) Negative for: Anorexia/bulimia; Confinement Anxiety Immunizations Pneumococcal Vaccine: Received Pneumococcal Vaccination: Yes Immunization Notes: up to date Implantable Devices No devices added Family and Social History Cancer: Yes - Siblings; Diabetes: No; Heart Disease: Yes - Mother,Siblings,Father; Hereditary Spherocytosis: No; Hypertension: Yes - Mother,Father,Siblings; Kidney Disease: No; Lung Disease: No; Seizures: No; Stroke: Yes - Father; Thyroid Problems: No; Tuberculosis: No; Never smoker; Marital Status - Married; Alcohol Use: Never; Drug Use: No History; Caffeine Use: Daily; Financial Concerns: No; Food, Clothing or Shelter Needs: No; Support System Lacking: No; Transportation Concerns: No; Advanced Directives: Yes (Not Provided); Patient does not want information on Advanced Directives; Medical Power of Attorney: Yes - Takoya Jonas Joaquim Lai (Not Provided) Physician Affirmation I have reviewed and agree with the above information. Electronic Signature(s) Signed: 06/30/2018 3:41:29 PM By: Worthy Keeler PA-C Signed: 06/30/2018 3:56:51 PM By: Gretta Cool, BSN, RN, CWS, Kim RN, BSN Entered By: Worthy Keeler on 06/30/2018 15:40:04 Hunn, Arnold Carlson ([id number]) -------------------------------------------------------------------------------- SuperBill Details Patient Name: [name] Date of Service: 06/30/2018 Medical Record Number: [id number] Patient Account Number: [id number] Date of Birth/Sex: 08-11-1925 (85 y.o. F) Treating RN: Cornell Barman Primary Care Provider: Thereasa Distance Other Clinician: Referring Provider: Thereasa Distance Treating Provider/Extender: Melburn Hake, HOYT Weeks in Treatment: 4 Diagnosis Coding ICD-10 Codes Code Description I73.89 Other specified peripheral vascular diseases L97.818 Non-pressure chronic ulcer of other part of right lower leg with other specified severity N18.3 Chronic kidney disease, stage 3 (moderate) I48.20 Chronic atrial fibrillation, unspecified Facility Procedures CPT4 Code: 02774128 Description: 99213 - WOUND CARE VISIT-LEV 3 EST PT Modifier: Quantity: 1 Physician Procedures CPT4 Code Description: 7867672 09470 - WC PHYS LEVEL 4 - EST PT ICD-10 Diagnosis Description I73.89 Other specified peripheral vascular diseases L97.818 Non-pressure chronic ulcer of other part of right lower leg wit N18.3 Chronic kidney disease, stage 3  (moderate) I48.20 Chronic atrial fibrillation, unspecified Modifier: h other specified Quantity: 1 severity Electronic Signature(s) Signed: 06/30/2018 3:41:29 PM By: Worthy Keeler PA-C Entered By: Worthy Keeler on 06/30/2018 15:41:10

## 2018-07-04 NOTE — Progress Notes (Signed)
[name] ([id number]) Visit Report for 06/30/2018 Arrival Information Details Patient Name: [name] Date of Service: 06/30/2018 2:15 PM Medical Record Number: [id number] Patient Account Number: [id number] Date of Birth/Sex: September 29, 1925 (85 y.o. F) Treating RN: Army Melia Primary Care Thalia Turkington: Thereasa Distance Other Clinician: Referring Ralene Gasparyan: Thereasa Distance Treating Alese Furniss/Extender: Melburn Hake, HOYT Weeks in Treatment: 4 Visit Information History Since Last Visit Added or deleted any medications: No Patient Arrived: Wheel Chair Any new allergies or adverse reactions: No Arrival Time: 14:13 Had a fall or experienced change in No Accompanied By: son activities of daily living that may affect Transfer Assistance: None risk of falls: Patient Has Alerts: Yes Signs or symptoms of abuse/neglect since last visito No Patient Alerts: DMII Hospitalized since last visit: No Implantable device outside of the clinic excluding No cellular tissue based products placed in the center since last visit: Has Dressing in Place as Prescribed: Yes Pain Present Now: No Electronic Signature(s) Signed: 06/30/2018 2:42:08 PM By: Army Melia Entered By: Army Melia on 06/30/2018 14:13:32 [name] ([id number]) -------------------------------------------------------------------------------- Clinic Level of Care Assessment Details Patient Name: [name] Date of Service: 06/30/2018 2:15 PM Medical Record Number: [id number] Patient Account Number: [id number] Date of Birth/Sex: February 16, 1926 (85 y.o. F) Treating RN: Cornell Barman Primary Care Fusaye Wachtel: Thereasa Distance Other Clinician: Referring Kalah Pflum: Thereasa Distance Treating Dameka Younker/Extender: Melburn Hake, HOYT Weeks in Treatment: 4 Clinic Level of Care Assessment Items TOOL 4 Quantity Score []  - Use when only an EandM is performed on FOLLOW-UP visit 0 ASSESSMENTS - Nursing Assessment / Reassessment []  - Reassessment of  Co-morbidities (includes updates in patient status) 0 X- 1 5 Reassessment of Adherence to Treatment Plan ASSESSMENTS - Wound and Skin Assessment / Reassessment X - Simple Wound Assessment / Reassessment - one wound 1 5 []  - 0 Complex Wound Assessment / Reassessment - multiple wounds []  - 0 Dermatologic / Skin Assessment (not related to wound area) ASSESSMENTS - Focused Assessment []  - Circumferential Edema Measurements - multi extremities 0 []  - 0 Nutritional Assessment / Counseling / Intervention []  - 0 Lower Extremity Assessment (monofilament, tuning fork, pulses) []  - 0 Peripheral Arterial Disease Assessment (using hand held doppler) ASSESSMENTS - Ostomy and/or Continence Assessment and Care []  - Incontinence Assessment and Management 0 []  - 0 Ostomy Care Assessment and Management (repouching, etc.) PROCESS - Coordination of Care X - Simple Patient / Family Education for ongoing care 1 15 []  - 0 Complex (extensive) Patient / Family Education for ongoing care X- 1 10 Staff obtains Programmer, systems, Records, Test Results / Process Orders []  - 0 Staff telephones HHA, Nursing Homes / Clarify orders / etc []  - 0 Routine Transfer to another Facility (non-emergent condition) []  - 0 Routine Hospital Admission (non-emergent condition) []  - 0 New Admissions / Biomedical engineer / Ordering NPWT, Apligraf, etc. []  - 0 Emergency Hospital Admission (emergent condition) X- 1 10 Simple Discharge Coordination Greenhaw, Erna J. ([id number]) []  - 0 Complex (extensive) Discharge Coordination PROCESS - Special Needs []  - Pediatric / Minor Patient Management 0 []  - 0 Isolation Patient Management []  - 0 Hearing / Language / Visual special needs []  - 0 Assessment of Community assistance (transportation, D/Kathleen planning, etc.) []  - 0 Additional assistance / Altered mentation []  - 0 Support Surface(s) Assessment (bed, cushion, seat, etc.) INTERVENTIONS - Wound Cleansing / Measurement X -  Simple Wound Cleansing - one wound 1 5 []  - 0 Complex Wound Cleansing - multiple wounds X- 1 5 Wound Imaging (photographs -  any number of wounds) []  - 0 Wound Tracing (instead of photographs) X- 1 5 Simple Wound Measurement - one wound []  - 0 Complex Wound Measurement - multiple wounds INTERVENTIONS - Wound Dressings []  - Small Wound Dressing one or multiple wounds 0 X- 1 15 Medium Wound Dressing one or multiple wounds []  - 0 Large Wound Dressing one or multiple wounds []  - 0 Application of Medications - topical []  - 0 Application of Medications - injection INTERVENTIONS - Miscellaneous []  - External ear exam 0 []  - 0 Specimen Collection (cultures, biopsies, blood, body fluids, etc.) []  - 0 Specimen(s) / Culture(s) sent or taken to Lab for analysis []  - 0 Patient Transfer (multiple staff / Civil Service fast streamer / Similar devices) []  - 0 Simple Staple / Suture removal (25 or less) []  - 0 Complex Staple / Suture removal (26 or more) []  - 0 Hypo / Hyperglycemic Management (close monitor of Blood Glucose) []  - 0 Ankle / Brachial Index (ABI) - do not check if billed separately X- 1 5 Vital Signs Chaudhuri, Lashon J. ([id number]) Has the patient been seen at the hospital within the last three years: Yes Total Score: 80 Level Of Care: New/Established - Level 3 Electronic Signature(s) Signed: 06/30/2018 3:56:51 PM By: Gretta Cool, BSN, RN, CWS, Kim RN, BSN Entered By: Gretta Cool, BSN, RN, CWS, Kim on 06/30/2018 14:39:23 [name] ([id number]) -------------------------------------------------------------------------------- Encounter Discharge Information Details Patient Name: [name] Date of Service: 06/30/2018 2:15 PM Medical Record Number: [id number] Patient Account Number: [id number] Date of Birth/Sex: 1925-12-19 (85 y.o. F) Treating RN: Harold Barban Primary Care Toniqua Melamed: Thereasa Distance Other Clinician: Referring Layza Summa: Thereasa Distance Treating Kynslie Ringle/Extender: Melburn Hake,  HOYT Weeks in Treatment: 4 Encounter Discharge Information Items Discharge Condition: Stable Ambulatory Status: Wheelchair Discharge Destination: Home Transportation: Private Auto Accompanied By: son Schedule Follow-up Appointment: Yes Clinical Summary of Care: Electronic Signature(s) Signed: 07/04/2018 9:04:30 AM By: Harold Barban Entered By: Harold Barban on 06/30/2018 15:05:19 Hirst, Arnold Carlson ([id number]) -------------------------------------------------------------------------------- Lower Extremity Assessment Details Patient Name: [name] Date of Service: 06/30/2018 2:15 PM Medical Record Number: [id number] Patient Account Number: [id number] Date of Birth/Sex: 10-Sep-1925 (85 y.o. F) Treating RN: Army Melia Primary Care Demonte Dobratz: Thereasa Distance Other Clinician: Referring Dorothey Oetken: Thereasa Distance Treating  Grosser/Extender: Melburn Hake, HOYT Weeks in Treatment: 4 Edema Assessment Assessed: [Left: No] [Right: No] Edema: [Left: N] [Right: o] Electronic Signature(s) Signed: 06/30/2018 2:42:08 PM By: Army Melia Entered By: Army Melia on 06/30/2018 14:18:24 Fadely, Arnold Carlson ([id number]) -------------------------------------------------------------------------------- Multi Wound Chart Details Patient Name: [name] Date of Service: 06/30/2018 2:15 PM Medical Record Number: [id number] Patient Account Number: [id number] Date of Birth/Sex: [birth year] (85 y.o. F) Treating RN: Cornell Barman Primary Care Mignonne Afonso: Thereasa Distance Other Clinician: Referring Shavone Nevers: Thereasa Distance Treating Margeret Stachnik/Extender: Melburn Hake, HOYT Weeks in Treatment: 4 Vital Signs Height(in): 63 Pulse(bpm): 85 Weight(lbs): 102 Blood Pressure(mmHg): 143/80 Body Mass Index(BMI): 18 Temperature(F): 98.4 Respiratory Rate 16 (breaths/min): Photos: [1:No Photos] [N/A:N/A] Wound Location: [1:Right Achilles] [N/A:N/A] Wounding Event: [1:Gradually Appeared] [N/A:N/A] Primary Etiology:  [1:Arterial Insufficiency Ulcer] [N/A:N/A] Secondary Etiology: [1:Diabetic Wound/Ulcer of the Lower Extremity] [N/A:N/A] Comorbid History: [1:Anemia, Arrhythmia, Congestive Heart Failure, Hypertension, Type II Diabetes, End Stage Renal Disease, Osteoarthritis] [N/A:N/A] Date Acquired: [1:04/01/2018] [N/A:N/A] Weeks of Treatment: [1:4] [N/A:N/A] Wound Status: [1:Open] [N/A:N/A] Pending Amputation on [1:Yes] [N/A:N/A] Presentation: Measurements L x W x D [1:3.9x2.7x0.3] [N/A:N/A] (cm) Area (cm) : [1:8.27] [N/A:N/A] Volume (cm) : [1:2.481] [N/A:N/A] % Reduction in Area: [1:-73.20%] [N/A:N/A] % Reduction in Volume: [1:-419.00%] [N/A:N/A] Classification: [  1:Partial Thickness] [N/A:N/A] Exudate Amount: [1:Medium] [N/A:N/A] Exudate Type: [1:Serosanguineous] [N/A:N/A] Exudate Color: [1:red, brown] [N/A:N/A] Wound Margin: [1:Flat and Intact] [N/A:N/A] Granulation Amount: [1:Small (1-33%)] [N/A:N/A] Granulation Quality: [1:Red] [N/A:N/A] Necrotic Amount: [1:Medium (34-66%)] [N/A:N/A] Necrotic Tissue: [1:Eschar, Adherent Slough] [N/A:N/A] Exposed Structures: [1:Fat Layer (Subcutaneous Tissue) Exposed: Yes Fascia: No Tendon: No Muscle: No] [N/A:N/A] Joint: No Bone: No Epithelialization: None N/A N/A Periwound Skin Texture: Callus: Yes N/A N/A Excoriation: No Induration: No Crepitus: No Rash: No Scarring: No Periwound Skin Moisture: Dry/Scaly: Yes N/A N/A Maceration: No Periwound Skin Color: Erythema: Yes N/A N/A Atrophie Blanche: No Cyanosis: No Ecchymosis: No Hemosiderin Staining: No Mottled: No Pallor: No Rubor: No Erythema Location: Circumferential N/A N/A Temperature: No Abnormality N/A N/A Tenderness on Palpation: Yes N/A N/A Wound Preparation: Ulcer Cleansing: N/A N/A Rinsed/Irrigated with Saline Topical Anesthetic Applied: Other: lidocaine 4% Treatment Notes Electronic Signature(s) Signed: 06/30/2018 3:56:51 PM By: Gretta Cool, BSN, RN, CWS, Kim RN, BSN Entered By:  Gretta Cool, BSN, RN, CWS, Kim on 06/30/2018 14:37:04 Duling, Arnold Carlson ([id number]) -------------------------------------------------------------------------------- Multi-Disciplinary Care Plan Details Patient Name: [name] Date of Service: 06/30/2018 2:15 PM Medical Record Number: [id number] Patient Account Number: [id number] Date of Birth/Sex: 10/22/1925 (85 y.o. F) Treating RN: Cornell Barman Primary Care Edword Cu: Thereasa Distance Other Clinician: Referring Collier Bohnet: Thereasa Distance Treating Krystn Dermody/Extender: Melburn Hake, HOYT Weeks in Treatment: 4 Active Inactive Abuse / Safety / Falls / Self Care Management Nursing Diagnoses: Potential for falls Goals: Patient will not experience any injury related to falls Date Initiated: 06/16/2018 Target Resolution Date: 09/16/2018 Goal Status: Active Interventions: Assess fall risk on admission and as needed Notes: Necrotic Tissue Nursing Diagnoses: Knowledge deficit related to management of necrotic/devitalized tissue Goals: Patient/caregiver will verbalize understanding of reason and process for debridement of necrotic tissue Date Initiated: 06/16/2018 Target Resolution Date: 09/16/2018 Goal Status: Active Interventions: Provide education on necrotic tissue and debridement process Notes: Nutrition Nursing Diagnoses: Imbalanced nutrition Goals: Patient/caregiver agrees to and verbalizes understanding of need to use nutritional supplements and/or vitamins as prescribed Date Initiated: 06/16/2018 Target Resolution Date: 09/16/2018 Goal Status: Active Interventions: Assess patient nutrition upon admission and as needed per policy MOMOKA, STRINGFIELD ([id number]) Notes: Wound/Skin Impairment Nursing Diagnoses: Impaired tissue integrity Knowledge deficit related to ulceration/compromised skin integrity Goals: Ulcer/skin breakdown will have a volume reduction of 30% by week 4 Date Initiated: 06/02/2018 Target Resolution Date: 07/01/2018 Goal Status:  Active Interventions: Assess patient/caregiver ability to obtain necessary supplies Assess patient/caregiver ability to perform ulcer/skin care regimen upon admission and as needed Assess ulceration(s) every visit Notes: Electronic Signature(s) Signed: 06/30/2018 3:56:51 PM By: Gretta Cool, BSN, RN, CWS, Kim RN, BSN Entered By: Gretta Cool, BSN, RN, CWS, Kim on 06/30/2018 14:34:20 Tabet, Arnold Carlson ([id number]) -------------------------------------------------------------------------------- Pain Assessment Details Patient Name: [name] Date of Service: 06/30/2018 2:15 PM Medical Record Number: [id number] Patient Account Number: [id number] Date of Birth/Sex: [birth year] (85 y.o. F) Treating RN: Army Melia Primary Care Cedricka Sackrider: Thereasa Distance Other Clinician: Referring Saatvik Thielman: Thereasa Distance Treating Jayveion Stalling/Extender: Melburn Hake, HOYT Weeks in Treatment: 4 Active Problems Location of Pain Severity and Description of Pain Patient Has Paino No Site Locations Pain Management and Medication Current Pain Management: Electronic Signature(s) Signed: 06/30/2018 2:42:08 PM By: Army Melia Entered By: Army Melia on 06/30/2018 14:13:38 Penaflor, Arnold Carlson ([id number]) -------------------------------------------------------------------------------- Patient/Caregiver Education Details Patient Name: [name] Date of Service: 06/30/2018 2:15 PM Medical Record Number: [id number] Patient Account Number: [id number] Date of Birth/Gender: 08-12-1925 (85 y.o. F) Treating RN: Cornell Barman Primary Care Physician: Thereasa Distance Other  Clinician: Referring Physician: Thereasa Distance Treating Physician/Extender: Sharalyn Ink in Treatment: 4 Education Assessment Education Provided To: Patient Education Topics Provided Wound Debridement: Wound/Skin Impairment: Handouts: Caring for Your Ulcer Methods: Demonstration, Explain/Verbal Responses: State content correctly Electronic  Signature(s) Signed: 07/04/2018 9:04:30 AM By: Harold Barban Entered By: Harold Barban on 06/30/2018 15:05:24 Balan, Arnold Carlson ([id number]) -------------------------------------------------------------------------------- Wound Assessment Details Patient Name: [name] Date of Service: 06/30/2018 2:15 PM Medical Record Number: [id number] Patient Account Number: [id number] Date of Birth/Sex: 1925/12/01 (85 y.o. 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 Wounding Event: Gradually Appeared Etiology: Date Acquired: 04/01/2018 Wound Open Weeks Of Treatment: 4 Status: Clustered Wound: No Comorbid Anemia, Arrhythmia, Congestive Heart Failure, Pending Amputation On Presentation History: Hypertension, Type II Diabetes, End Stage Renal Disease, Osteoarthritis Photos Photo Uploaded By: Army Melia on 06/30/2018 14:42:52 Wound Measurements Length: (cm) 3.9 Width: (cm) 2.7 Depth: (cm) 0.3 Area: (cm) 8.27 Volume: (cm) 2.481 % Reduction in Area: -73.2% % Reduction in Volume: -419% Epithelialization: None Tunneling: No Undermining: No Wound Description Classification: Partial Thickness Wound Margin: Flat and Intact Exudate Amount: Medium Exudate Type: Serosanguineous Exudate Color: red, brown Foul Odor After Cleansing: No Slough/Fibrino Yes Wound Bed Granulation Amount: Small (1-33%) Exposed Structure Granulation Quality: Red Fascia Exposed: No Necrotic Amount: Medium (34-66%) Fat Layer (Subcutaneous Tissue) Exposed: Yes Necrotic Quality: Eschar, Adherent Slough Tendon Exposed: No Muscle Exposed: No Joint Exposed: No Bone Exposed: No Olund, Nikitha J.  ([id number]) Periwound Skin Texture Texture Color No Abnormalities Noted: No No Abnormalities Noted: No Callus: Yes Atrophie Blanche: No Crepitus: No Cyanosis: No Excoriation: No Ecchymosis: No Induration: No Erythema: Yes Rash: No Erythema Location: Circumferential Scarring: No Hemosiderin Staining: No Mottled: No Moisture Pallor: No No Abnormalities Noted: No Rubor: No Dry / Scaly: Yes Maceration: No Temperature / Pain Temperature: No Abnormality Tenderness on Palpation: Yes Wound Preparation Ulcer Cleansing: Rinsed/Irrigated with Saline Topical Anesthetic Applied: Other: lidocaine 4%, Treatment Notes Wound #1 (Right Achilles) Notes Prisma Ag, hydrogel, xeroform, telfa, abd, conform to secure Electronic Signature(s) Signed: 06/30/2018 2:42:08 PM By: Army Melia Entered By: Army Melia on 06/30/2018 14:18:15 Rekowski, Arnold Carlson ([id number]) -------------------------------------------------------------------------------- Vitals Details Patient Name: [name] Date of Service: 06/30/2018 2:15 PM Medical Record Number: [id number] Patient Account Number: [id number] Date of Birth/Sex: 1925/07/17 (85 y.o. F) Treating RN: Army Melia Primary Care Jurney Overacker: Thereasa Distance Other Clinician: Referring Marsi Turvey: Thereasa Distance Treating Mishell Donalson/Extender: Melburn Hake, HOYT Weeks in Treatment: 4 Vital Signs Time Taken: 14:13 Temperature (F): 98.4 Height (in): 63 Pulse (bpm): 85 Weight (lbs): 102 Respiratory Rate (breaths/min): 16 Body Mass Index (BMI): 18.1 Blood Pressure (mmHg): 143/80 Reference Range: 80 - 120 mg / dl Electronic Signature(s) Signed: 06/30/2018 2:42:08 PM By: Army Melia Entered By: Army Melia on 06/30/2018 14:14:05

## 2018-07-06 ENCOUNTER — Encounter (INDEPENDENT_AMBULATORY_CARE_PROVIDER_SITE_OTHER): Payer: Self-pay | Admitting: Nurse Practitioner

## 2018-07-06 NOTE — Progress Notes (Signed)
SUBJECTIVE:  Patient ID: [name], female    DOB: 11/22/1925, 85 y.o.   MRN: [id number] Chief Complaint  Patient presents with  . Follow-up    ultrasound follow up    HPI  [name] is a 85 y.o. female The patient returns to the office for followup and review of the noninvasive studies. There have been no interval changes in lower extremity symptoms. No interval shortening of the patient's claudication distance or development of rest pain symptoms.  The patient's wound that is located on her right lower extremity proximal to her heel is still present.  The patient states that she is following up with wound care for treatment of this wound.  She states that her last report stated that it was healing currently.  There have been no significant changes to the patient's overall health care.  The patient denies amaurosis fugax or recent TIA symptoms. There are no recent neurological changes noted. The patient denies history of DVT, PE or superficial thrombophlebitis. The patient denies recent episodes of angina or shortness of breath.   ABI Rt=1.28 and Lt=[place]   (previous ABI's Rt=0.89 and Lt=1.13) The left tibial arteries have biphasic waveforms the right tibial arteries appears to have a biphasic waveform at the anterior tibial with monophasic at the posterior tibial.  Toe waveforms are normal on the right lower extremity and somewhat dampened on the left.  Duplex ultrasound of the right lower extremity reveals triphasic waveforms to the level of the tibial arteries which are monophasic.  Past Medical History:  Diagnosis Date  . A-fib ([place])   . CHF (congestive heart failure) ([place])   . Diabetes mellitus without complication ([place])   . Hypertension     Past Surgical History:  Procedure Laterality Date  . CHOLECYSTECTOMY    . LOWER EXTREMITY ANGIOGRAPHY Right 08/12/2017   Procedure: LOWER EXTREMITY ANGIOGRAPHY;  Surgeon: Katha Cabal, MD;  Location: [facility];   Service: Cardiovascular;  Laterality: Right;  . LOWER EXTREMITY ANGIOGRAPHY Right 06/13/2018   Procedure: LOWER EXTREMITY ANGIOGRAPHY;  Surgeon: Katha Cabal, MD;  Location: [facility];  Service: Cardiovascular;  Laterality: Right;    Social History   Socioeconomic History  . Marital status: Married    Spouse name: Not on file  . Number of children: Not on file  . Years of education: Not on file  . Highest education level: Not on file  Occupational History  . Not on file  Social Needs  . Financial resource strain: Not on file  . Food insecurity:    Worry: Not on file    Inability: Not on file  . Transportation needs:    Medical: Not on file    Non-medical: Not on file  Tobacco Use  . Smoking status: Never Smoker  . Smokeless tobacco: Never Used  Substance and Sexual Activity  . Alcohol use: No    Frequency: Never  . Drug use: No  . Sexual activity: Not on file  Lifestyle  . Physical activity:    Days per week: Not on file    Minutes per session: Not on file  . Stress: Not on file  Relationships  . Social connections:    Talks on phone: Not on file    Gets together: Not on file    Attends religious service: Not on file    Active member of club or organization: Not on file    Attends meetings of clubs or organizations: Not  on file    Relationship status: Not on file  . Intimate partner violence:    Fear of current or ex partner: Not on file    Emotionally abused: Not on file    Physically abused: Not on file    Forced sexual activity: Not on file  Other Topics Concern  . Not on file  Social History Narrative  . Not on file    Family History  Problem Relation Age of Onset  . Cancer Brother     Allergies  Allergen Reactions  . Sulfa Antibiotics Hives     Review of Systems   Review of Systems: Negative Unless Checked Constitutional: [] Weight loss  [] Fever  [] Chills Cardiac: [] Chest pain   [x]  Atrial Fibrillation  [] Palpitations    [] Shortness of breath when laying flat   [] Shortness of breath with exertion. [] Shortness of breath at rest Vascular:  [] Pain in legs with walking   [] Pain in legs with standing [] Pain in legs when laying flat   [] Claudication    [] Pain in feet when laying flat    [] History of DVT   [] Phlebitis   [x] Swelling in legs   [] Varicose veins   [x] Non-healing ulcers Pulmonary:   [] Uses home oxygen   [] Productive cough   [] Hemoptysis   [] Wheeze  [] COPD   [] Asthma Neurologic:  [] Dizziness   [] Seizures  [] Blackouts [] History of stroke   [] History of TIA  [] Aphasia   [] Temporary Blindness   [] Weakness or numbness in arm   [] Weakness or numbness in leg Musculoskeletal:   [] Joint swelling   [] Joint pain   [] Low back pain  []  History of Knee Replacement [] Arthritis [] back Surgeries  []  Spinal Stenosis    Hematologic:  [] Easy bruising  [] Easy bleeding   [] Hypercoagulable state   [] Anemic Gastrointestinal:  [] Diarrhea   [] Vomiting  [] Gastroesophageal reflux/heartburn   [] Difficulty swallowing. [] Abdominal pain Genitourinary:  [] Chronic kidney disease   [] Difficult urination  [] Anuric   [] Blood in urine [] Frequent urination  [] Burning with urination   [] Hematuria Skin:  [] Rashes   [x] Ulcers [] Wounds Psychological:  [] History of anxiety   []  History of major depression  []  Memory Difficulties      OBJECTIVE:   Physical Exam  BP (!) 163/79 (BP Location: Left Arm)   Pulse 97   Resp 16   Gen: WD/WN, NAD Head: [place]/AT, No temporalis wasting.  Ear/Nose/Throat: Hearing grossly intact, nares w/o erythema or drainage Eyes: PER, EOMI, sclera nonicteric.  Neck: Supple, no masses.  No JVD.  Pulmonary:  Good air movement, no use of accessory muscles.  Cardiac: RRR Vascular: large ulceration on right lower extremity above heel Vessel Right Left  Radial Palpable Palpable  Dorsalis Pedis Trace Palpable Not Palpable  Posterior Tibial Not Palpable Not Palpable   Gastrointestinal: soft, non-distended. No guarding/no  peritoneal signs.  Musculoskeletal: M/S 5/5 throughout.  No deformity or atrophy.  Neurologic: Pain and light touch intact in extremities.  Symmetrical.  Speech is fluent. Motor exam as listed above. Psychiatric: Judgment intact, Mood & affect appropriate for pt's clinical situation. Dermatologic: No Venous rashes. No Ulcers Noted.  No changes consistent with cellulitis. Lymph : No Cervical lymphadenopathy, no lichenification or skin changes of chronic lymphedema.       ASSESSMENT AND PLAN:  1. PAD (peripheral artery disease) (HCC)   Recommend:  The patient is status post successful angiogram with intervention.  The patient reports that the claudication symptoms and leg pain is essentially gone.   The patient denies lifestyle limiting changes  at this point in time.  No further invasive studies, angiography or surgery at this time The patient should continue walking and begin a more formal exercise program.  The patient should continue antiplatelet therapy and aggressive treatment of the lipid abnormalities  Based upon the patient's noninvasive studies today she should have adequate blood flow for wound healing.  Patient instructed to call us prior to her follow-up if wound healing is delayed or if wound worsens despite our intervention and wound center treatments.  Patient should undergo noninvasive studies as ordered. The patient will follow up with me after the studies.   The patient will return in 3 months for ABIs.  2. Essential hypertension Continue antihypertensive medications as already ordered, these medications have been reviewed and there are no changes at this time.   3. Mixed hyperlipidemia Continue statin as ordered and reviewed, no changes at this time   4. Type 2 diabetes mellitus with diabetic peripheral angiopathy without gangrene, with long-term current use of insulin (HCC) Continue hypoglycemic medications as already ordered, these medications have been  reviewed and there are no changes at this time.  Hgb A1C to be monitored as already arranged by primary service    Current Outpatient Medications on File Prior to Visit  Medication Sig Dispense Refill  . ACCU-CHEK AVIVA PLUS test strip     . acetaminophen (TYLENOL) 325 MG tablet Take 325-650 mg by mouth every 6 (six) hours as needed (for pain.).    Marland Kitchen allopurinol (ZYLOPRIM) 100 MG tablet Take 100 mg by mouth daily.    . Calcium Carb-Cholecalciferol (CALCIUM 600+D3 PO) Take 1 tablet by mouth daily.    . carvedilol (COREG) 6.25 MG tablet Take 6.25 mg by mouth 2 (two) times daily.    . cholecalciferol (VITAMIN D) 25 MCG (1000 UT) tablet Take 1,000 Units by mouth daily.    . clopidogrel (PLAVIX) 75 MG tablet Take 1 tablet (75 mg total) by mouth daily. 30 tablet 11  . diltiazem (CARDIZEM CD) 120 MG 24 hr capsule Take 120 mg by mouth daily.     Marland Kitchen doxycycline (VIBRAMYCIN) 50 MG capsule Take 100 mg by mouth 2 (two) times daily.    . furosemide (LASIX) 20 MG tablet Take 40 mg by mouth daily.     Marland Kitchen gabapentin (NEURONTIN) 100 MG capsule Take 100 mg by mouth daily as needed (pain).     Marland Kitchen HUMALOG KWIKPEN 100 UNIT/ML KiwkPen Inject 8-10 Units into the skin 3 (three) times daily before meals. Per sliding scale    . LEVEMIR FLEXTOUCH 100 UNIT/ML Pen Inject 8 Units into the skin at bedtime.     Marland Kitchen levothyroxine (SYNTHROID, LEVOTHROID) 88 MCG tablet Take 88 mcg by mouth daily before breakfast.     . lovastatin (MEVACOR) 20 MG tablet Take 20 mg by mouth daily with supper.     . meclizine (ANTIVERT) 25 MG tablet Take 25 mg by mouth 3 (three) times daily as needed for dizziness.     . Melatonin 3 MG TABS Take 3 mg by mouth at bedtime as needed (sleep).    . metFORMIN (GLUCOPHAGE) 500 MG tablet Take 500 mg by mouth 2 (two) times daily with a meal.     . Multiple Vitamins-Minerals (PRESERVISION AREDS 2 PO) Take 1 tablet by mouth every evening.    . potassium chloride SA (K-DUR,KLOR-CON) 20 MEQ tablet Take 20 mEq  by mouth daily as needed (cramping).      No current facility-administered medications on file prior  to visit.     There are no Patient Instructions on file for this visit. Return in about 3 months (around 09/29/2018).   Kris Hartmann, NP  This note was completed with Sales executive.  Any errors are purely unintentional.

## 2018-07-07 ENCOUNTER — Other Ambulatory Visit: Payer: Self-pay

## 2018-07-07 ENCOUNTER — Encounter: Payer: Medicare Other | Admitting: Physician Assistant

## 2018-07-07 DIAGNOSIS — E11622 Type 2 diabetes mellitus with other skin ulcer: Secondary | ICD-10-CM | POA: Diagnosis not present

## 2018-07-08 NOTE — Progress Notes (Signed)
Kathleen, Carlson ([id number]) Visit Report for 07/07/2018 Arrival Information Details Patient Name: [name], [name] Date of Service: 07/07/2018 2:30 PM Medical Record Number: [id number] Patient Account Number: [id number] Date of Birth/Sex: 06/26/1925 (85 y.o. F) Treating RN: Army Melia Primary Care Timiya Howells: Thereasa Distance Other Clinician: Referring Latarsha Zani: Thereasa Distance Treating Ndia Sampath/Extender: Melburn Hake, HOYT Weeks in Treatment: 5 Visit Information History Since Last Visit Added or deleted any medications: No Patient Arrived: Wheel Chair Any new allergies or adverse reactions: No Arrival Time: 14:29 Had a fall or experienced change in No Accompanied By: son activities of daily living that may affect Transfer Assistance: None risk of falls: Patient Has Alerts: Yes Signs or symptoms of abuse/neglect since last visito No Patient Alerts: DMII Hospitalized since last visit: No Implantable device outside of the clinic excluding No cellular tissue based products placed in the center since last visit: Has Dressing in Place as Prescribed: Yes Pain Present Now: No Electronic Signature(s) Signed: 07/07/2018 3:42:32 PM By: Army Melia Entered By: Army Melia on 07/07/2018 14:29:37 Carlson, [name] ([id number]) -------------------------------------------------------------------------------- Clinic Level of Care Assessment Details Patient Name: [name] Date of Service: 07/07/2018 2:30 PM Medical Record Number: [id number] Patient Account Number: [id number] Date of Birth/Sex: [birth year] (85 y.o. F) Treating RN: [name] Primary Care Erva Koke: Thereasa Distance Other Clinician: Referring Andoni Busch: Thereasa Distance Treating Iesha Summerhill/Extender: Melburn Hake, HOYT Weeks in Treatment: 5 Clinic Level of Care Assessment Items TOOL 4 Quantity Score []  - Use when only an EandM is performed on FOLLOW-UP visit 0 ASSESSMENTS - Nursing Assessment / Reassessment X - Reassessment of  Co-morbidities (includes updates in patient status) 1 10 X- 1 5 Reassessment of Adherence to Treatment Plan ASSESSMENTS - Wound and Skin Assessment / Reassessment X - Simple Wound Assessment / Reassessment - one wound 1 5 []  - 0 Complex Wound Assessment / Reassessment - multiple wounds []  - 0 Dermatologic / Skin Assessment (not related to wound area) ASSESSMENTS - Focused Assessment []  - Circumferential Edema Measurements - multi extremities 0 []  - 0 Nutritional Assessment / Counseling / Intervention X- 1 5 Lower Extremity Assessment (monofilament, tuning fork, pulses) []  - 0 Peripheral Arterial Disease Assessment (using hand held doppler) ASSESSMENTS - Ostomy and/or Continence Assessment and Care []  - Incontinence Assessment and Management 0 []  - 0 Ostomy Care Assessment and Management (repouching, etc.) PROCESS - Coordination of Care X - Simple Patient / Family Education for ongoing care 1 15 []  - 0 Complex (extensive) Patient / Family Education for ongoing care X- 1 10 Staff obtains Programmer, systems, Records, Test Results / Process Orders []  - 0 Staff telephones HHA, Nursing Homes / Clarify orders / etc []  - 0 Routine Transfer to another Facility (non-emergent condition) []  - 0 Routine Hospital Admission (non-emergent condition) []  - 0 New Admissions / Biomedical engineer / Ordering NPWT, Apligraf, etc. []  - 0 Emergency Hospital Admission (emergent condition) X- 1 10 Simple Discharge Coordination Carlson, Kathleen J. ([id number]) []  - 0 Complex (extensive) Discharge Coordination PROCESS - Special Needs []  - Pediatric / Minor Patient Management 0 []  - 0 Isolation Patient Management []  - 0 Hearing / Language / Visual special needs []  - 0 Assessment of Community assistance (transportation, D/C planning, etc.) []  - 0 Additional assistance / Altered mentation []  - 0 Support Surface(s) Assessment (bed, cushion, seat, etc.) INTERVENTIONS - Wound Cleansing / Measurement X -  Simple Wound Cleansing - one wound 1 5 []  - 0 Complex Wound Cleansing - multiple wounds X- 1 5 Wound Imaging (photographs -  any number of wounds) []  - 0 Wound Tracing (instead of photographs) X- 1 5 Simple Wound Measurement - one wound []  - 0 Complex Wound Measurement - multiple wounds INTERVENTIONS - Wound Dressings []  - Small Wound Dressing one or multiple wounds 0 X- 1 15 Medium Wound Dressing one or multiple wounds []  - 0 Large Wound Dressing one or multiple wounds []  - 0 Application of Medications - topical []  - 0 Application of Medications - injection INTERVENTIONS - Miscellaneous []  - External ear exam 0 []  - 0 Specimen Collection (cultures, biopsies, blood, body fluids, etc.) []  - 0 Specimen(s) / Culture(s) sent or taken to Lab for analysis []  - 0 Patient Transfer (multiple staff / Civil Service fast streamer / Similar devices) []  - 0 Simple Staple / Suture removal (25 or less) []  - 0 Complex Staple / Suture removal (26 or more) []  - 0 Hypo / Hyperglycemic Management (close monitor of Blood Glucose) []  - 0 Ankle / Brachial Index (ABI) - do not check if billed separately X- 1 5 Vital Signs Carlson, Kathleen J. ([id number]) Has the patient been seen at the hospital within the last three years: Yes Total Score: 95 Level Of Care: New/Established - Level 3 Electronic Signature(s) Signed: 07/07/2018 4:37:01 PM By: [name] Entered By: [name] on 07/07/2018 14:53:36 Carlson, [name] ([id number]) -------------------------------------------------------------------------------- Encounter Discharge Information Details Patient Name: [name] Date of Service: 07/07/2018 2:30 PM Medical Record Number: [id number] Patient Account Number: [id number] Date of Birth/Sex: 1925-04-17 (85 y.o. F) Treating RN: Army Melia Primary Care Kewanda Poland: Thereasa Distance Other Clinician: Referring Arthur Aydelotte: Thereasa Distance Treating Guillaume Weninger/Extender: Melburn Hake, HOYT Weeks in Treatment:  5 Encounter Discharge Information Items Discharge Condition: Stable Ambulatory Status: Wheelchair Discharge Destination: Home Transportation: Private Auto Accompanied By: son Schedule Follow-up Appointment: Yes Clinical Summary of Care: Electronic Signature(s) Signed: 07/07/2018 3:42:32 PM By: Army Melia Entered By: Army Melia on 07/07/2018 15:04:19 Lebeck, [name] ([id number]) -------------------------------------------------------------------------------- Lower Extremity Assessment Details Patient Name: [name] Date of Service: 07/07/2018 2:30 PM Medical Record Number: [id number] Patient Account Number: [id number] Date of Birth/Sex: 09-16-1925 (85 y.o. F) Treating RN: Army Melia Primary Care Czarina Gingras: Thereasa Distance Other Clinician: Referring Ahria Slappey: Thereasa Distance Treating Earlene Bjelland/Extender: Melburn Hake, HOYT Weeks in Treatment: 5 Edema Assessment Assessed: [Left: No] [Right: No] Edema: [Left: N] [Right: o] Vascular Assessment Pulses: Dorsalis Pedis Palpable: [Right:Yes] Extremity colors, hair growth, and conditions: Extremity Color: [Right:Normal] Hair Growth on Extremity: [Right:No] Temperature of Extremity: [Right:Warm < 3 seconds] Toe Nail Assessment Left: Right: Thick: Yes Discolored: No Deformed: No Improper Length and Hygiene: No Electronic Signature(s) Signed: 07/07/2018 3:42:32 PM By: Army Melia Entered By: Army Melia on 07/07/2018 14:36:02 Fulghum, [name] ([id number]) -------------------------------------------------------------------------------- Multi Wound Chart Details Patient Name: [name] Date of Service: 07/07/2018 2:30 PM Medical Record Number: [id number] Patient Account Number: [id number] Date of Birth/Sex: Nov 23, 1925 (85 y.o. F) Treating RN: [name] Primary Care Donnell Beauchamp: Thereasa Distance Other Clinician: Referring Nettie Wyffels: Thereasa Distance Treating Tremayne Sheldon/Extender: Melburn Hake, HOYT Weeks in Treatment: 5 Vital  Signs Height(in): 63 Pulse(bpm): 23 Weight(lbs): 102 Blood Pressure(mmHg): 158/83 Body Mass Index(BMI): 18 Temperature(F): 98.1 Respiratory Rate 16 (breaths/min): Photos: [N/A:N/A] Wound Location: Right Achilles N/A N/A Wounding Event: Gradually Appeared N/A N/A Primary Etiology: Arterial Insufficiency Ulcer N/A N/A Secondary Etiology: Diabetic Wound/Ulcer of the N/A N/A Lower Extremity Comorbid History: Anemia, Arrhythmia, N/A N/A Congestive Heart Failure, Hypertension, Type II Diabetes, End Stage Renal Disease, Osteoarthritis Date Acquired: 04/01/2018 N/A N/A Weeks of Treatment: 5 N/A N/A Wound  Status: Open N/A N/A Pending Amputation on Yes N/A N/A Presentation: Measurements L x W x D 4.2x2.6x0.3 N/A N/A (cm) Area (cm) : 8.577 N/A N/A Volume (cm) : 2.573 N/A N/A % Reduction in Area: -79.60% N/A N/A % Reduction in Volume: -438.30% N/A N/A Classification: Partial Thickness N/A N/A Exudate Amount: Medium N/A N/A Exudate Type: Serosanguineous N/A N/A Exudate Color: red, brown N/A N/A Wound Margin: Flat and Intact N/A N/A Granulation Amount: None Present (0%) N/A N/A Necrotic Amount: Large (67-100%) N/A N/A Necrotic Tissue: Eschar, Adherent Slough N/A N/A Bublitz, Kyliee J. ([id number]) Exposed Structures: Fat Layer (Subcutaneous N/A N/A Tissue) Exposed: Yes Fascia: No Tendon: No Muscle: No Joint: No Bone: No Epithelialization: None N/A N/A Periwound Skin Texture: Callus: Yes N/A N/A Excoriation: No Induration: No Crepitus: No Rash: No Scarring: No Periwound Skin Moisture: Dry/Scaly: Yes N/A N/A Maceration: No Periwound Skin Color: Erythema: Yes N/A N/A Atrophie Blanche: No Cyanosis: No Ecchymosis: No Hemosiderin Staining: No Mottled: No Pallor: No Rubor: No Erythema Location: Circumferential N/A N/A Temperature: No Abnormality N/A N/A Tenderness on Palpation: Yes N/A N/A Wound Preparation: Ulcer Cleansing: N/A N/A Rinsed/Irrigated with  Saline Topical Anesthetic Applied: Other: lidocaine 4% Treatment Notes Electronic Signature(s) Signed: 07/07/2018 4:37:01 PM By: [name] Entered By: [name] on 07/07/2018 14:52:18 Jerkins, [name] ([id number]) -------------------------------------------------------------------------------- Multi-Disciplinary Care Plan Details Patient Name: [name] Date of Service: 07/07/2018 2:30 PM Medical Record Number: [id number] Patient Account Number: [id number] Date of Birth/Sex: May 05, 1925 (85 y.o. F) Treating RN: [name] Primary Care Liani Caris: Thereasa Distance Other Clinician: Referring Ahmani Prehn: Thereasa Distance Treating Jerik Falletta/Extender: Melburn Hake, HOYT Weeks in Treatment: 5 Active Inactive Abuse / Safety / Falls / Self Care Management Nursing Diagnoses: Potential for falls Goals: Patient will not experience any injury related to falls Date Initiated: 06/16/2018 Target Resolution Date: 09/16/2018 Goal Status: Active Interventions: Assess fall risk on admission and as needed Notes: Necrotic Tissue Nursing Diagnoses: Knowledge deficit related to management of necrotic/devitalized tissue Goals: Patient/caregiver will verbalize understanding of reason and process for debridement of necrotic tissue Date Initiated: 06/16/2018 Target Resolution Date: 09/16/2018 Goal Status: Active Interventions: Provide education on necrotic tissue and debridement process Notes: Nutrition Nursing Diagnoses: Imbalanced nutrition Goals: Patient/caregiver agrees to and verbalizes understanding of need to use nutritional supplements and/or vitamins as prescribed Date Initiated: 06/16/2018 Target Resolution Date: 09/16/2018 Goal Status: Active Interventions: Assess patient nutrition upon admission and as needed per policy ANALENA, GAMA ([id number]) Notes: Wound/Skin Impairment Nursing Diagnoses: Impaired tissue integrity Knowledge deficit related to ulceration/compromised skin  integrity Goals: Ulcer/skin breakdown will have a volume reduction of 30% by week 4 Date Initiated: 06/02/2018 Target Resolution Date: 07/01/2018 Goal Status: Active Interventions: Assess patient/caregiver ability to obtain necessary supplies Assess patient/caregiver ability to perform ulcer/skin care regimen upon admission and as needed Assess ulceration(s) every visit Notes: Electronic Signature(s) Signed: 07/07/2018 4:37:01 PM By: [name] Entered By: [name] on 07/07/2018 14:52:11 Seiler, [name] ([id number]) -------------------------------------------------------------------------------- Pain Assessment Details Patient Name: [name] Date of Service: 07/07/2018 2:30 PM Medical Record Number: [id number] Patient Account Number: [id number] Date of Birth/Sex: [birth year] (85 y.o. F) Treating RN: Army Melia Primary Care Sarina Robleto: Thereasa Distance Other Clinician: Referring Joyanne Eddinger: Thereasa Distance Treating Mingo Siegert/Extender: Melburn Hake, HOYT Weeks in Treatment: 5 Active Problems Location of Pain Severity and Description of Pain Patient Has Paino No Site Locations Pain Management and Medication Current Pain Management: Electronic Signature(s) Signed: 07/07/2018 3:42:32 PM By: Army Melia Entered By: Army Melia on 07/07/2018 14:29:50 Dia, Dayzha  Lenna Sciara ([id number]) -------------------------------------------------------------------------------- Patient/Caregiver Education Details Patient Name: [name] Date of Service: 07/07/2018 2:30 PM Medical Record Number: [id number] Patient Account Number: [id number] Date of Birth/Gender: [birth year] (85 y.o. F) Treating RN: [name] Primary Care Physician: Thereasa Distance Other Clinician: Referring Physician: Thereasa Distance Treating Physician/Extender: Sharalyn Ink in Treatment: 5 Education Assessment Education Provided To: Patient Education Topics Provided Wound/Skin Impairment: Handouts: Other:  wound care as ordered Methods: Demonstration, Explain/Verbal Responses: State content correctly Electronic Signature(s) Signed: 07/07/2018 4:37:01 PM By: [name] Entered By: [name] on 07/07/2018 14:53:53 Randleman, [name] ([id number]) -------------------------------------------------------------------------------- Wound Assessment Details Patient Name: [name] Date of Service: 07/07/2018 2:30 PM Medical Record Number: [id number] Patient Account Number: [id number] Date of Birth/Sex: 06-24-1925 (85 y.o. 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 Wounding Event: Gradually Appeared Etiology: Date Acquired: 04/01/2018 Wound Open Weeks Of Treatment: 5 Status: Clustered Wound: No Comorbid Anemia, Arrhythmia, Congestive Heart Failure, Pending Amputation On Presentation History: Hypertension, Type II Diabetes, End Stage Renal Disease, Osteoarthritis Photos Photo Uploaded By: Army Melia on 07/07/2018 14:45:10 Wound Measurements Length: (cm) 4.2 Width: (cm) 2.6 Depth: (cm) 0.3 Area: (cm) 8.577 Volume: (cm) 2.573 % Reduction in Area: -79.6% % Reduction in Volume: -438.3% Epithelialization: None Tunneling: No Undermining: No Wound Description Classification: Partial Thickness Wound Margin: Flat and Intact Exudate Amount: Medium Exudate Type: Serosanguineous Exudate Color: red, brown Foul Odor After Cleansing: No Slough/Fibrino Yes Wound Bed Granulation Amount: None Present (0%) Exposed Structure Necrotic Amount: Large (67-100%) Fascia Exposed: No Necrotic Quality: Eschar, Adherent Slough Fat Layer (Subcutaneous Tissue) Exposed: Yes Tendon Exposed:  No Muscle Exposed: No Joint Exposed: No Bone Exposed: No Neale, Shadana J. ([id number]) Periwound Skin Texture Texture Color No Abnormalities Noted: No No Abnormalities Noted: No Callus: Yes Atrophie Blanche: No Crepitus: No Cyanosis: No Excoriation: No Ecchymosis: No Induration: No Erythema: Yes Rash: No Erythema Location: Circumferential Scarring: No Hemosiderin Staining: No Mottled: No Moisture Pallor: No No Abnormalities Noted: No Rubor: No Dry / Scaly: Yes Maceration: No Temperature / Pain Temperature: No Abnormality Tenderness on Palpation: Yes Wound Preparation Ulcer Cleansing: Rinsed/Irrigated with Saline Topical Anesthetic Applied: Other: lidocaine 4%, Treatment Notes Wound #1 (Right Achilles) Notes hydrogel, xeroform, telfa, abd, conform to secure Electronic Signature(s) Signed: 07/07/2018 3:42:32 PM By: Army Melia Entered By: Army Melia on 07/07/2018 14:35:41 Mittman, [name] ([id number]) -------------------------------------------------------------------------------- Vitals Details Patient Name: [name] Date of Service: 07/07/2018 2:30 PM Medical Record Number: [id number] Patient Account Number: [id number] Date of Birth/Sex: [birth year] (85 y.o. F) Treating RN: Army Melia Primary Care Almendra Loria: Thereasa Distance Other Clinician: Referring Valor Quaintance: Thereasa Distance Treating Aleeyah Bensen/Extender: Melburn Hake, HOYT Weeks in Treatment: 5 Vital Signs Time Taken: 14:29 Temperature (F): 98.1 Height (in): 63 Pulse (bpm): 86 Weight (lbs): 102 Respiratory Rate (breaths/min): 16 Body Mass Index (BMI): 18.1 Blood Pressure (mmHg): 158/83 Reference Range: 80 - 120 mg / dl Electronic Signature(s) Signed: 07/07/2018 3:42:32 PM By: Army Melia Entered By: Army Melia on 07/07/2018 14:30:07

## 2018-07-08 NOTE — Progress Notes (Signed)
ATZIRY, BARANSKI ([id number]) Visit Report for 07/07/2018 Chief Complaint Document Details Patient Name: [name], [name] Date of Service: 07/07/2018 2:30 PM Medical Record Number: [id number] Patient Account Number: [id number] Date of Birth/Sex: Dec 12, 1925 (85 y.o. F) Treating RN: [name] Primary Care Provider: Thereasa Distance Other Clinician: Referring Provider: Thereasa Distance Treating Provider/Extender: Melburn Hake, HOYT Weeks in Treatment: 5 Information Obtained from: Patient Chief Complaint Right Lower leg Achilles ulcer Electronic Signature(s) Signed: 07/07/2018 4:16:42 PM By: Worthy Keeler PA-C Entered By: Worthy Keeler on 07/07/2018 14:23:09 Muston, [name] ([id number]) -------------------------------------------------------------------------------- HPI Details Patient Name: [name] Date of Service: 07/07/2018 2:30 PM Medical Record Number: [id number] Patient Account Number: [id number] Date of Birth/Sex: May 18, 1925 (85 y.o. F) Treating RN: [name] Primary Care Provider: Thereasa Distance Other Clinician: Referring Provider: Thereasa Distance Treating Provider/Extender: Melburn Hake, HOYT Weeks in Treatment: 5 History of Present Illness HPI Description: 06/02/18 patient presents today for initial evaluation our clinic concerning the ulcers that she has on the right Achilles location. 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 She was subsequently supposed to have had a scheduled likely stent placement in December 2019 unfortunately she had a fall with a sub dural hematoma which subsequently had to be evacuated partially at least. For that reason she has never gotten back in with Dr. Delana Meyer to have the vascular procedure. She does not have any appointment scheduled with him at this time. The patient does have a history of chronic kidney disease stage III, congestive heart failure, trophy relation, and at  this point home health who has been coming out has been using [facility]. This is been since she's been home towards the end of December as best we can tell. She does have a hemoglobin A1c of 7.0 which was obtained on 11/18/17. Currently the patient's right lower extremity that showed diminished blood flow according to her arterial study which is dated 02/27/18. At that point her right ABI was 0.89 with a TBI of 0.36 and her left ABI was 1.13 with a TBI of 0.50. Her prior study which was in May on the 13th 2019 showed that she had a right ABI of one and a right TBI of 0.51 with a left ABI of 0.72 and a TBI of 0.50. Obviously since May till now her right leg has diminished as far as the blood flows concerned patient's son who was with her today states that Dr. 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 I do believe that for limb salvage this patient needs to have the above stated procedure with Dr. Delana Meyer as soon as possible. 06/09/18 on evaluation today patient actually appears to be doing about the same in regard to her Achilles ulcer. This actually does have tended exposed however the tenant is remaining moist which is good news that's exactly what we want to see. Fortunately there's no signs of infection she does have an appointment with Dr. Delana Meyer this coming Tuesday, June 13, 2018. 06/16/18 on evaluation today patient actually appears to be doing rather well in regard to her right lower extremity at this point. The Achilles ulcer actually showed signs of being a little bit better as far as the overall appearance of the wound was concerned today. Fortunately there did not appear to be the evidence of infection at this time which is good news. With that being said she did have her angiogram where she did actually  have a significant blockage of the 90% into her lower extremity. Fortunately between the balloon angioplasty followed by stating this was able to be improved to  less than 5% residual stenosis according to Dr. Nino Parsley note that I read. She still may have some limitation into her foot in particular as far as the runoff is concerned but nonetheless in general appears to be doing much better which is great news. Fortunately there is no sign of infection at this time. 06/23/18 on evaluation today patient's wound bed actually appears to be doing about the same although the central portion of the tendon actually appears to be somewhat dry this is definitely not good news. We want to try to keep this moist while allowing the edges of the wound hopefully start to cover the tendon that is the goal. Nonetheless I think that we may need to switch things up a little bit to try to improve the moisture trapping at this point. She does have better blood flow which is great we have applied for EpiFix although unfortunately as of right now this shows to be not covered. I think that we need to try to see about getting this approved as with the tenant expose me to do everything we can to try to get this covering of the skin as soon as possible. Again I'm not even sure that she'd be a candidate for plastic surgery intervention. 06/30/18 on evaluation today patient actually appears to be doing about the same in regard to her wound on the right Achilles region. The Achilles tendon is still exposed there does not appear to be any tissue growth around the edge while the tendon itself does not appear to be quite as dry as it was previous there's definitely necrosis of the tenant beginning to be noted unfortunately. For that reason I think that we really need to make an appointment for her to see a specialist in order to discuss the options with her in regard to this one. Fortunately there does not appear to be any signs of systemic infection or even local infection for that matter. 07/07/18 on evaluation today patient appears to be doing about the same in regard to her Achilles  region also. There does not appear to be any signs of active infection at this time which is good news. With that being said her Achilles tendon is very dry and appears to be more so due to the fact that apparently the last dressing that was put on was used hydrogel with a Dry gauze of the top not what had been previously recommended. For that reason again the tendon does appear to be quite a bit Durio, Cristi J. ([id number]) more dry than what it was previous unfortunately. They still have not heard from orthopedics is for the referral for her Achilles region. We had made this referral last week on Tuesday the sign still hadn't heard anything and the office told him that they did not have a referral from [place] we therefore reset the referral then he still has not heard anything. Electronic Signature(s) Signed: 07/07/2018 4:16:42 PM By: Worthy Keeler PA-C Entered By: Worthy Keeler on 07/07/2018 15:02:00 Claunch, [name] ([id number]) -------------------------------------------------------------------------------- Physical Exam Details Patient Name: [name] Date of Service: 07/07/2018 2:30 PM Medical Record Number: [id number] Patient Account Number: [id number] Date of Birth/Sex: Aug 07, 1925 (85 y.o. F) Treating RN: [name] Primary Care Provider: Thereasa Distance Other Clinician: Referring Provider: Thereasa Distance Treating Provider/Extender: Melburn Hake, HOYT Weeks in  Treatment: 5 Constitutional Well-nourished and well-hydrated in no acute distress. Respiratory normal breathing without difficulty. clear to auscultation bilaterally. Cardiovascular regular rate and rhythm with normal S1, S2. Psychiatric this patient is able to make decisions and demonstrates good insight into disease process. Alert and Oriented x 3. pleasant and cooperative. Notes At this point patient's wound bed again shows mainly exposed tendon in the Achilles region which is dry unfortunately. We need to try to  work on soften this up allow it to not become as necrotic. I'm trying to do as much as we can in this regard. Electronic Signature(s) Signed: 07/07/2018 4:16:42 PM By: Worthy Keeler PA-C Entered By: Worthy Keeler on 07/07/2018 15:02:24 Maroney, [name] ([id number]) -------------------------------------------------------------------------------- Physician Orders Details Patient Name: [name] Date of Service: 07/07/2018 2:30 PM Medical Record Number: [id number] Patient Account Number: [id number] Date of Birth/Sex: 1925-11-03 (85 y.o. F) Treating RN: [name] Primary Care Provider: Thereasa Distance Other Clinician: Referring Provider: Thereasa Distance Treating Provider/Extender: Melburn Hake, HOYT Weeks in Treatment: 5 Verbal / Phone Orders: No Diagnosis Coding ICD-10 Coding Code Description I73.89 Other specified peripheral vascular diseases L97.818 Non-pressure chronic ulcer of other part of right lower leg with other specified severity N18.3 Chronic kidney disease, stage 3 (moderate) I48.20 Chronic atrial fibrillation, unspecified Wound Cleansing Wound #1 Right Achilles o Clean wound with Normal Saline. Primary Wound Dressing Wound #1 Right Achilles o Hydrogel - or [place] Jelly over exposed tendon to prevent tendon from drying out o Xeroform - over hydrogel to trap moisture on tendon Secondary Dressing Wound #1 Right Achilles o ABD and Kerlix/Conform Dressing Change Frequency Wound #1 Right Achilles o Change dressing every other day. Follow-up Appointments Wound #1 Right Achilles o Return Appointment in 1 week. Home Health Wound #1 Right Achilles o Continue Home Health Visits o Home Health Nurse may visit PRN to address patientos wound care needs. o FACE TO FACE ENCOUNTER: MEDICARE and MEDICAID PATIENTS: I certify that this patient is under my care and that I had a face-to-face encounter that meets the physician face-to-face encounter requirements  with this patient on this date. The encounter with the patient was in whole or in part for the following MEDICAL CONDITION: (primary reason for [place]) MEDICAL NECESSITY: I certify, that based on my findings, NURSING services are a medically necessary home health service. HOME BOUND STATUS: I certify that my clinical findings support that this patient is homebound (i.e., Due to illness or injury, pt requires aid of supportive devices such as crutches, cane, wheelchairs, walkers, the use of special transportation or the assistance of another person to leave their place of residence. There is a normal inability to leave the home and doing so requires considerable and taxing effort. Other absences are for medical reasons / religious services and are infrequent or of short duration when for other reasons). MATHEA, FRIELING ([id number]) o If current dressing causes regression in wound condition, may D/C ordered dressing product/s and apply Normal Saline Moist Dressing daily until next [place] / Other MD appointment. [place] of regression in wound condition at [phone]. o Please direct any NON-WOUND related issues/requests for orders to patient's Primary Care Physician Electronic Signature(s) Signed: 07/07/2018 4:16:42 PM By: Worthy Keeler PA-C Signed: 07/07/2018 4:37:01 PM By: [name] Entered By: [name] on 07/07/2018 14:53:02 Conchas, [name] ([id number]) -------------------------------------------------------------------------------- Problem List Details Patient Name: [name] Date of Service: 07/07/2018 2:30 PM Medical Record Number: [id number] Patient Account Number:  [id number] Date of Birth/Sex: [birth year] (85 y.o. F) Treating RN: [name] Primary Care Provider: Thereasa Distance Other Clinician: Referring Provider: Thereasa Distance Treating Provider/Extender: Melburn Hake, HOYT Weeks in Treatment: 5 Active  Problems ICD-10 Evaluated Encounter Code Description Active Date Today Diagnosis I73.89 Other specified peripheral vascular diseases 06/02/2018 No Yes L97.818 Non-pressure chronic ulcer of other part of right lower leg 06/02/2018 No Yes with other specified severity N18.3 Chronic kidney disease, stage 3 (moderate) 06/02/2018 No Yes I48.20 Chronic atrial fibrillation, unspecified 06/02/2018 No Yes Inactive Problems Resolved Problems Electronic Signature(s) Signed: 07/07/2018 4:16:42 PM By: Worthy Keeler PA-C Entered By: Worthy Keeler on 07/07/2018 14:23:00 Semrad, [name] ([id number]) -------------------------------------------------------------------------------- Progress Note Details Patient Name: [name] Date of Service: 07/07/2018 2:30 PM Medical Record Number: [id number] Patient Account Number: [id number] Date of Birth/Sex: 03/02/1926 (85 y.o. F) Treating RN: [name] Primary Care Provider: Thereasa Distance Other Clinician: Referring Provider: Thereasa Distance Treating Provider/Extender: Melburn Hake, HOYT Weeks in Treatment: 5 Subjective Chief Complaint Information obtained from Patient Right Lower leg Achilles ulcer History of Present Illness (HPI) 06/02/18 patient presents today for initial evaluation our clinic concerning the ulcers that she has on the right Achilles location. 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 She was subsequently supposed to have had a scheduled likely stent placement in December 2019 unfortunately she had a fall with a sub dural hematoma which subsequently had to be evacuated partially at least. For that reason she has never gotten back in with Dr. Delana Meyer to have the vascular procedure. She does not have any appointment scheduled with him at this time. The patient does have a history of chronic kidney disease stage III, congestive heart failure, trophy relation, and at this  point home health who has been coming out has been using [facility]. This is been since she's been home towards the end of December as best we can tell. She does have a hemoglobin A1c of 7.0 which was obtained on 11/18/17. Currently the patient's right lower extremity that showed diminished blood flow according to her arterial study which is dated 02/27/18. At that point her right ABI was 0.89 with a TBI of 0.36 and her left ABI was 1.13 with a TBI of 0.50. Her prior study which was in May on the 13th 2019 showed that she had a right ABI of one and a right TBI of 0.51 with a left ABI of 0.72 and a TBI of 0.50. Obviously since May till now her right leg has diminished as far as the blood flows concerned patient's son who was with her today states that Dr. 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 I do believe that for limb salvage this patient needs to have the above stated procedure with Dr. Delana Meyer as soon as possible. 06/09/18 on evaluation today patient actually appears to be doing about the same in regard to her Achilles ulcer. This actually does have tended exposed however the tenant is remaining moist which is good news that's exactly what we want to see. Fortunately there's no signs of infection she does have an appointment with Dr. Delana Meyer this coming Tuesday, June 13, 2018. 06/16/18 on evaluation today patient actually appears to be doing rather well in regard to her right lower extremity at this point. The Achilles ulcer actually showed signs of being a little bit better as far as the overall appearance of the wound was concerned  today. Fortunately there did not appear to be the evidence of infection at this time which is good news. With that being said she did have her angiogram where she did actually have a significant blockage of the 90% into her lower extremity. Fortunately between the balloon angioplasty followed by stating this was able to be improved to less  than 5% residual stenosis according to Dr. Nino Parsley note that I read. She still may have some limitation into her foot in particular as far as the runoff is concerned but nonetheless in general appears to be doing much better which is great news. Fortunately there is no sign of infection at this time. 06/23/18 on evaluation today patient's wound bed actually appears to be doing about the same although the central portion of the tendon actually appears to be somewhat dry this is definitely not good news. We want to try to keep this moist while allowing the edges of the wound hopefully start to cover the tendon that is the goal. Nonetheless I think that we may need to switch things up a little bit to try to improve the moisture trapping at this point. She does have better blood flow which is great we have applied for EpiFix although unfortunately as of right now this shows to be not covered. I think that we need to try to see about getting this approved as with the tenant expose me to do everything we can to try to get this covering of the skin as soon as possible. Again I'm not even sure that she'd be a candidate for plastic surgery intervention. 06/30/18 on evaluation today patient actually appears to be doing about the same in regard to her wound on the right Achilles region. The Achilles tendon is still exposed there does not appear to be any tissue growth around the edge while the tendon itself does not appear to be quite as dry as it was previous there's definitely necrosis of the tenant beginning to be noted unfortunately. For that reason I think that we really need to make an appointment for her to see a specialist in order to discuss the options with her in regard to this one. Fortunately there does not appear to be any signs of systemic infection or even local Dowen, Kyri J. ([id number]) infection for that matter. 07/07/18 on evaluation today patient appears to be doing about the same in  regard to her Achilles region also. There does not appear to be any signs of active infection at this time which is good news. With that being said her Achilles tendon is very dry and appears to be more so due to the fact that apparently the last dressing that was put on was used hydrogel with a Dry gauze of the top not what had been previously recommended. For that reason again the tendon does appear to be quite a bit more dry than what it was previous unfortunately. They still have not heard from orthopedics is for the referral for her Achilles region. We had made this referral last week on Tuesday the sign still hadn't heard anything and the office told him that they did not have a referral from [place] we therefore reset the referral then he still has not heard anything. Patient History Information obtained from Patient. Family History Cancer - Siblings, Heart Disease - Mother,Siblings,Father, Hypertension - Mother,Father,Siblings, Stroke - Father, No family history of Diabetes, Hereditary Spherocytosis, Kidney Disease, Lung Disease, Seizures, Thyroid Problems, Tuberculosis. Social History Never smoker, Marital Status -  Married, Alcohol Use - Never, Drug Use - No History, Caffeine Use - Daily. Medical History Eyes Denies history of Cataracts, Glaucoma Ear/Nose/Mouth/Throat Denies history of Chronic sinus problems/congestion, Middle ear problems Hematologic/Lymphatic Patient has history of Anemia Denies history of Hemophilia, Human Immunodeficiency Virus, Lymphedema, Sickle Cell Disease Respiratory Denies history of Aspiration, Asthma, Chronic Obstructive Pulmonary Disease (COPD), Pneumothorax, Sleep Apnea, Tuberculosis Cardiovascular Patient has history of Arrhythmia - a fib, Congestive Heart Failure, Hypertension Denies history of Angina, Coronary Artery Disease, Deep Vein Thrombosis, Hypotension, Myocardial Infarction, Peripheral Arterial Disease, Peripheral Venous Disease, Phlebitis,  Vasculitis Gastrointestinal Denies history of Cirrhosis , Colitis, Crohn s, Hepatitis A, Hepatitis B, Hepatitis C Endocrine Patient has history of Type II Diabetes Denies history of Type I Diabetes Genitourinary Patient has history of End Stage Renal Disease - CKD stage 3 Immunological Denies history of Lupus Erythematosus, Raynaud s, Scleroderma Integumentary (Skin) Denies history of History of Burn, History of pressure wounds Musculoskeletal Patient has history of Osteoarthritis Denies history of Gout, Rheumatoid Arthritis, Osteomyelitis Neurologic Denies history of Dementia, Neuropathy, Quadriplegia, Paraplegia, Seizure Disorder Oncologic Denies history of Received Chemotherapy, Received Radiation Psychiatric Denies history of Anorexia/bulimia, Confinement Anxiety Medical And Surgical History Notes Cardiovascular Kapuscinski, Koya J. ([id number]) HLD, mitral insufficiency Musculoskeletal 4 broken ribs Review of Systems (ROS) Constitutional Symptoms (General Health) Denies complaints or symptoms of Fever, Chills. Respiratory The patient has no complaints or symptoms. Cardiovascular The patient has no complaints or symptoms. Psychiatric The patient has no complaints or symptoms. Objective Constitutional Well-nourished and well-hydrated in no acute distress. Vitals Time Taken: 2:29 PM, Height: 63 in, Weight: 102 lbs, BMI: 18.1, Temperature: 98.1 F, Pulse: 86 bpm, Respiratory Rate: 16 breaths/min, Blood Pressure: 158/83 mmHg. Respiratory normal breathing without difficulty. clear to auscultation bilaterally. Cardiovascular regular rate and rhythm with normal S1, S2. Psychiatric this patient is able to make decisions and demonstrates good insight into disease process. Alert and Oriented x 3. pleasant and cooperative. General Notes: At this point patient's wound bed again shows mainly exposed tendon in the Achilles region which is dry unfortunately. We need to try to work on  soften this up allow it to not become as necrotic. I'm trying to do as much as we can in this regard. Integumentary (Hair, Skin) Wound #1 status is Open. Original cause of wound was Gradually Appeared. The wound is located on the Right Achilles. The wound measures 4.2cm length x 2.6cm width x 0.3cm depth; 8.577cm^2 area and 2.573cm^3 volume. There is Fat Layer (Subcutaneous Tissue) Exposed exposed. There is no tunneling or undermining noted. There is a medium amount of serosanguineous drainage noted. The wound margin is flat and intact. There is no granulation within the wound bed. There is a large (67-100%) amount of necrotic tissue within the wound bed including Eschar and Adherent Slough. The periwound skin appearance exhibited: Callus, Dry/Scaly, Erythema. The periwound skin appearance did not exhibit: Crepitus, Excoriation, Induration, Rash, Scarring, Maceration, Atrophie Blanche, Cyanosis, Ecchymosis, Hemosiderin Staining, Mottled, Pallor, Rubor. The surrounding wound skin color is noted with erythema which is circumferential. Periwound temperature was noted as No Abnormality. The periwound has tenderness on palpation. TAMMERA, ENGERT ([id number]) Assessment Active Problems ICD-10 Other specified peripheral vascular diseases Non-pressure chronic ulcer of other part of right lower leg with other specified severity Chronic kidney disease, stage 3 (moderate) Chronic atrial fibrillation, unspecified Plan Wound Cleansing: Wound #1 Right Achilles: Clean wound with Normal Saline. Primary Wound Dressing: Wound #1 Right Achilles: Hydrogel - or [place] Jelly over exposed tendon to  prevent tendon from drying out Xeroform - over hydrogel to trap moisture on tendon Secondary Dressing: Wound #1 Right Achilles: ABD and Kerlix/Conform Dressing Change Frequency: Wound #1 Right Achilles: Change dressing every other day. Follow-up Appointments: Wound #1 Right Achilles: Return Appointment in 1  week. Home Health: Wound #1 Right Achilles: [place] Nurse may visit PRN to address patient s wound care needs. FACE TO FACE ENCOUNTER: MEDICARE and MEDICAID PATIENTS: I certify that this patient is under my care and that I had a face-to-face encounter that meets the physician face-to-face encounter requirements with this patient on this date. The encounter with the patient was in whole or in part for the following MEDICAL CONDITION: (primary reason for [place]) MEDICAL NECESSITY: I certify, that based on my findings, NURSING services are a medically necessary home health service. HOME BOUND STATUS: I certify that my clinical findings support that this patient is homebound (i.e., Due to illness or injury, pt requires aid of supportive devices such as crutches, cane, wheelchairs, walkers, the use of special transportation or the assistance of another person to leave their place of residence. There is a normal inability to leave the home and doing so requires considerable and taxing effort. Other absences are for medical reasons / religious services and are infrequent or of short duration when for other reasons). If current dressing causes regression in wound condition, may D/C ordered dressing product/s and apply Normal Saline Moist Dressing daily until next [place] / Other MD appointment. [place] of regression in wound condition at [phone]. Please direct any NON-WOUND related issues/requests for orders to patient's Primary Care Physician Patient's wound currently is doing about the same I do think that she needs to see orthopedics as soon as possible and we are going to attempt to get this scheduled I could give the number two patient son he's gonna call them again in order to see if LAYLANI, PUDWILL. ([id number]) they finally got the referral confirmation from [place] and can get the patient scheduled. Hopefully don't have that  many calls later today. If anything changes or worsens in the meantime he will contact the office and let me know. Otherwise will see were things stand in one weeks time. Please see above for specific wound care orders. We will see patient for re-evaluation in 1 week(s) here in the clinic. If anything worsens or changes patient will contact our office for additional recommendations. Electronic Signature(s) Signed: 07/07/2018 4:16:42 PM By: Worthy Keeler PA-C Entered By: Worthy Keeler on 07/07/2018 15:03:07 Demby, [name] ([id number]) -------------------------------------------------------------------------------- ROS/PFSH Details Patient Name: [name] Date of Service: 07/07/2018 2:30 PM Medical Record Number: [id number] Patient Account Number: [id number] Date of Birth/Sex: 06-07-1925 (85 y.o. F) Treating RN: [name] Primary Care Provider: Thereasa Distance Other Clinician: Referring Provider: Thereasa Distance Treating Provider/Extender: Melburn Hake, HOYT Weeks in Treatment: 5 Information Obtained From Patient Wound History Do you currently have one or more open woundso Yes How many open wounds do you currently haveo 1 Approximately how long have you had your woundso 2 months How have you been treating your wound(s) until nowo medihoney Has your wound(s) ever healed and then re-openedo No Have you had any lab work done in the past montho No Have you tested positive for an antibiotic resistant organism (MRSA, VRE)o No Have you tested positive for osteomyelitis (bone infection)o No Have you had any tests for circulation on your legso Yes Where was the  test doneo [facility] Constitutional Symptoms (General Health) Complaints and Symptoms: Negative for: Fever; Chills Eyes Medical History: Negative for: Cataracts; Glaucoma Ear/Nose/Mouth/Throat Medical History: Negative for: Chronic sinus problems/congestion; Middle ear problems Hematologic/Lymphatic Medical  History: Positive for: Anemia Negative for: Hemophilia; Human Immunodeficiency Virus; Lymphedema; Sickle Cell Disease Respiratory Complaints and Symptoms: No Complaints or Symptoms Medical History: Negative for: Aspiration; Asthma; Chronic Obstructive Pulmonary Disease (COPD); Pneumothorax; Sleep Apnea; Tuberculosis Cardiovascular Complaints and Symptoms: No Complaints or Symptoms Burchill, Tenille J. ([id number]) Medical History: Positive for: Arrhythmia - a fib; Congestive Heart Failure; Hypertension Negative for: Angina; Coronary Artery Disease; Deep Vein Thrombosis; Hypotension; Myocardial Infarction; Peripheral Arterial Disease; Peripheral Venous Disease; Phlebitis; Vasculitis Past Medical History Notes: HLD, mitral insufficiency Gastrointestinal Medical History: Negative for: Cirrhosis ; Colitis; Crohnos; Hepatitis A; Hepatitis B; Hepatitis C Endocrine Medical History: Positive for: Type II Diabetes Negative for: Type I Diabetes Treated with: Insulin Blood sugar tested every day: Yes Tested : Genitourinary Medical History: Positive for: End Stage Renal Disease - CKD stage 3 Immunological Medical History: Negative for: Lupus Erythematosus; Raynaudos; Scleroderma Integumentary (Skin) Medical History: Negative for: History of Burn; History of pressure wounds Musculoskeletal Medical History: Positive for: Osteoarthritis Negative for: Gout; Rheumatoid Arthritis; Osteomyelitis Past Medical History Notes: 4 broken ribs Neurologic Medical History: Negative for: Dementia; Neuropathy; Quadriplegia; Paraplegia; Seizure Disorder Oncologic Medical History: Negative for: Received Chemotherapy; Received Radiation Psychiatric Complaints and Symptoms: No Complaints or Symptoms Medical History: DASHANIQUE, BROWNSTEIN ([id number]) Negative for: Anorexia/bulimia; Confinement Anxiety Immunizations Pneumococcal Vaccine: Received Pneumococcal Vaccination: Yes Immunization Notes: up to  date Implantable Devices No devices added Family and Social History Cancer: Yes - Siblings; Diabetes: No; Heart Disease: Yes - Mother,Siblings,Father; Hereditary Spherocytosis: No; Hypertension: Yes - Mother,Father,Siblings; Kidney Disease: No; Lung Disease: No; Seizures: No; Stroke: Yes - Father; Thyroid Problems: No; Tuberculosis: No; Never smoker; Marital Status - Married; Alcohol Use: Never; Drug Use: No History; Caffeine Use: Daily; Financial Concerns: No; Food, Clothing or Shelter Needs: No; Support System Lacking: No; Transportation Concerns: No; Advanced Directives: Yes (Not Provided); Patient does not want information on Advanced Directives; Medical Power of Attorney: Yes - Tiearra Colwell Joaquim Lai (Not Provided) Physician Affirmation I have reviewed and agree with the above information. Electronic Signature(s) Signed: 07/07/2018 4:16:42 PM By: Worthy Keeler PA-C Signed: 07/07/2018 4:37:01 PM By: [name] Entered By: Worthy Keeler on 07/07/2018 15:02:12 Nielson, [name] ([id number]) -------------------------------------------------------------------------------- SuperBill Details Patient Name: [name] Date of Service: 07/07/2018 Medical Record Number: [id number] Patient Account Number: [id number] Date of Birth/Sex: [birth year] (85 y.o. F) Treating RN: [name] Primary Care Provider: Thereasa Distance Other Clinician: Referring Provider: Thereasa Distance Treating Provider/Extender: Melburn Hake, HOYT Weeks in Treatment: 5 Diagnosis Coding ICD-10 Codes Code Description I73.89 Other specified peripheral vascular diseases L97.818 Non-pressure chronic ulcer of other part of right lower leg with other specified severity N18.3 Chronic kidney disease, stage 3 (moderate) I48.20 Chronic atrial fibrillation, unspecified Facility Procedures CPT4 Code: [id number] Description: 99213 - WOUND CARE VISIT-LEV 3 EST PT Modifier: Quantity: 1 Physician Procedures CPT4 Code Description:  [id number] 06301 - WC PHYS LEVEL 4 - EST PT ICD-10 Diagnosis Description I73.89 Other specified peripheral vascular diseases L97.818 Non-pressure chronic ulcer of other part of right lower leg wit N18.3 Chronic kidney disease, stage 3  (moderate) I48.20 Chronic atrial fibrillation, unspecified Modifier: h other specified Quantity: 1 severity Electronic Signature(s) Signed: 07/07/2018 4:16:42 PM By: Worthy Keeler PA-C Entered By: Worthy Keeler on 07/07/2018 15:03:18

## 2018-07-14 ENCOUNTER — Encounter: Payer: Medicare Other | Attending: Physician Assistant | Admitting: Physician Assistant

## 2018-07-14 ENCOUNTER — Other Ambulatory Visit: Payer: Self-pay

## 2018-07-14 DIAGNOSIS — I132 Hypertensive heart and chronic kidney disease with heart failure and with stage 5 chronic kidney disease, or end stage renal disease: Secondary | ICD-10-CM | POA: Insufficient documentation

## 2018-07-14 DIAGNOSIS — Z823 Family history of stroke: Secondary | ICD-10-CM | POA: Diagnosis not present

## 2018-07-14 DIAGNOSIS — I509 Heart failure, unspecified: Secondary | ICD-10-CM | POA: Diagnosis not present

## 2018-07-14 DIAGNOSIS — I340 Nonrheumatic mitral (valve) insufficiency: Secondary | ICD-10-CM | POA: Insufficient documentation

## 2018-07-14 DIAGNOSIS — N186 End stage renal disease: Secondary | ICD-10-CM | POA: Diagnosis present

## 2018-07-14 DIAGNOSIS — Z809 Family history of malignant neoplasm, unspecified: Secondary | ICD-10-CM | POA: Diagnosis not present

## 2018-07-14 DIAGNOSIS — E785 Hyperlipidemia, unspecified: Secondary | ICD-10-CM | POA: Diagnosis not present

## 2018-07-14 DIAGNOSIS — Z8249 Family history of ischemic heart disease and other diseases of the circulatory system: Secondary | ICD-10-CM | POA: Diagnosis not present

## 2018-07-14 DIAGNOSIS — E1122 Type 2 diabetes mellitus with diabetic chronic kidney disease: Secondary | ICD-10-CM | POA: Diagnosis not present

## 2018-07-14 DIAGNOSIS — I482 Chronic atrial fibrillation, unspecified: Secondary | ICD-10-CM | POA: Insufficient documentation

## 2018-07-14 DIAGNOSIS — M1990 Unspecified osteoarthritis, unspecified site: Secondary | ICD-10-CM | POA: Diagnosis not present

## 2018-07-14 DIAGNOSIS — L97819 Non-pressure chronic ulcer of other part of right lower leg with unspecified severity: Secondary | ICD-10-CM | POA: Diagnosis not present

## 2018-07-14 DIAGNOSIS — E11622 Type 2 diabetes mellitus with other skin ulcer: Secondary | ICD-10-CM | POA: Diagnosis not present

## 2018-07-14 NOTE — Progress Notes (Signed)
Kathleen, Carlson ([id number]) Visit Report for 07/14/2018 Chief Complaint Document Details Patient Name: [name], [name] Date of Service: 07/14/2018 9:45 AM Medical Record Number: [id number] Patient Account Number: [id number] Date of Birth/Sex: [birth year] (85 y.o. F) Treating RN: [name] Primary Care Provider: Thereasa Distance Other Clinician: Referring Provider: Thereasa Distance Treating Provider/Extender: Melburn Hake, HOYT Weeks in Treatment: 6 Information Obtained from: Patient Chief Complaint Right Lower leg Achilles ulcer Electronic Signature(s) Signed: 07/14/2018 3:18:49 PM By: Worthy Keeler PA-C Entered By: Worthy Keeler on 07/14/2018 09:27:44 Moldovan, [name] ([id number]) -------------------------------------------------------------------------------- HPI Details Patient Name: [name] Date of Service: 07/14/2018 9:45 AM Medical Record Number: [id number] Patient Account Number: [id number] Date of Birth/Sex: 15-Aug-1925 (85 y.o. F) Treating RN: [name] Primary Care Provider: Thereasa Distance Other Clinician: Referring Provider: Thereasa Distance Treating Provider/Extender: Melburn Hake, HOYT Weeks in Treatment: 6 History of Present Illness HPI Description: 06/02/18 patient presents today for initial evaluation our clinic concerning the ulcers that she has on the right Achilles location. 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 She was subsequently supposed to have had a scheduled likely stent placement in December 2019 unfortunately she had a fall with a sub dural hematoma which subsequently had to be evacuated partially at least. For that reason she has never gotten back in with Dr. Delana Meyer to have the vascular procedure. She does not have any appointment scheduled with him at this time. The patient does have a history of chronic kidney disease stage III, congestive heart failure, trophy relation, and at this  point home health who has been coming out has been using [facility]. This is been since she's been home towards the end of December as best we can tell. She does have a hemoglobin A1c of 7.0 which was obtained on 11/18/17. Currently the patient's right lower extremity that showed diminished blood flow according to her arterial study which is dated 02/27/18. At that point her right ABI was 0.89 with a TBI of 0.36 and her left ABI was 1.13 with a TBI of 0.50. Her prior study which was in May on the 13th 2019 showed that she had a right ABI of one and a right TBI of 0.51 with a left ABI of 0.72 and a TBI of 0.50. Obviously since May till now her right leg has diminished as far as the blood flows concerned patient's son who was with her today states that Dr. 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 I do believe that for limb salvage this patient needs to have the above stated procedure with Dr. Delana Meyer as soon as possible. 06/09/18 on evaluation today patient actually appears to be doing about the same in regard to her Achilles ulcer. This actually does have tended exposed however the tenant is remaining moist which is good news that's exactly what we want to see. Fortunately there's no signs of infection she does have an appointment with Dr. Delana Meyer this coming Tuesday, June 13, 2018. 06/16/18 on evaluation today patient actually appears to be doing rather well in regard to her right lower extremity at this point. The Achilles ulcer actually showed signs of being a little bit better as far as the overall appearance of the wound was concerned today. Fortunately there did not appear to be the evidence of infection at this time which is good news. With that being said she did have her angiogram where she did actually  have a significant blockage of the 90% into her lower extremity. Fortunately between the balloon angioplasty followed by stating this was able to be improved to less  than 5% residual stenosis according to Dr. Nino Parsley note that I read. She still may have some limitation into her foot in particular as far as the runoff is concerned but nonetheless in general appears to be doing much better which is great news. Fortunately there is no sign of infection at this time. 06/23/18 on evaluation today patient's wound bed actually appears to be doing about the same although the central portion of the tendon actually appears to be somewhat dry this is definitely not good news. We want to try to keep this moist while allowing the edges of the wound hopefully start to cover the tendon that is the goal. Nonetheless I think that we may need to switch things up a little bit to try to improve the moisture trapping at this point. She does have better blood flow which is great we have applied for EpiFix although unfortunately as of right now this shows to be not covered. I think that we need to try to see about getting this approved as with the tenant expose me to do everything we can to try to get this covering of the skin as soon as possible. Again I'm not even sure that she'd be a candidate for plastic surgery intervention. 06/30/18 on evaluation today patient actually appears to be doing about the same in regard to her wound on the right Achilles region. The Achilles tendon is still exposed there does not appear to be any tissue growth around the edge while the tendon itself does not appear to be quite as dry as it was previous there's definitely necrosis of the tenant beginning to be noted unfortunately. For that reason I think that we really need to make an appointment for her to see a specialist in order to discuss the options with her in regard to this one. Fortunately there does not appear to be any signs of systemic infection or even local infection for that matter. 07/07/18 on evaluation today patient appears to be doing about the same in regard to her Achilles region  also. There does not appear to be any signs of active infection at this time which is good news. With that being said her Achilles tendon is very dry and appears to be more so due to the fact that apparently the last dressing that was put on was used hydrogel with a Dry gauze of the top not what had been previously recommended. For that reason again the tendon does appear to be quite a bit Shear, Mishika J. ([id number]) more dry than what it was previous unfortunately. They still have not heard from orthopedics is for the referral for her Achilles region. We had made this referral last week on Tuesday the sign still hadn't heard anything and the office told him that they did not have a referral from [place] we therefore reset the referral then he still has not heard anything. 07/14/18 on evaluation today patient appears to be doing about the same in regard to her right Achilles ulcer. She has been tolerating the dressing changes without complication. With that being said nothing seems to really improve significantly in my pinion. She did see the orthopedic doctor earlier this week they recommended a referral to Dr. Vickki Muff her podiatrist for further evaluation as well as a potential referral to plastic surgery although that is not  scheduled as of yet. The patient is scheduled to see her podiatrist on Monday. There's no signs of active infection at this time. Electronic Signature(s) Signed: 07/14/2018 3:18:49 PM By: Worthy Keeler PA-C Entered By: Worthy Keeler on 07/14/2018 15:05:23 Bonilla, [name] ([id number]) -------------------------------------------------------------------------------- Physical Exam Details Patient Name: [name] Date of Service: 07/14/2018 9:45 AM Medical Record Number: [id number] Patient Account Number: [id number] Date of Birth/Sex: 1926-02-07 (85 y.o. F) Treating RN: [name] Primary Care Provider: Thereasa Distance Other Clinician: Referring Provider: Thereasa Distance Treating Provider/Extender: Melburn Hake, HOYT Weeks in Treatment: 6 Constitutional Well-nourished and well-hydrated in no acute distress. Respiratory normal breathing without difficulty. clear to auscultation bilaterally. Cardiovascular regular rate and rhythm with normal S1, S2. Psychiatric this patient is able to make decisions and demonstrates good insight into disease process. Alert and Oriented x 3. pleasant and cooperative. Notes At this point upon evaluation it does not appear that the patient is showing any signs of worsening although there's also no improvement. No sharp debridement was performed at this time. Electronic Signature(s) Signed: 07/14/2018 3:18:49 PM By: Worthy Keeler PA-C Entered By: Worthy Keeler on 07/14/2018 15:07:05 Deininger, [name] ([id number]) -------------------------------------------------------------------------------- Physician Orders Details Patient Name: [name] Date of Service: 07/14/2018 9:45 AM Medical Record Number: [id number] Patient Account Number: [id number] Date of Birth/Sex: 1925/10/12 (85 y.o. F) Treating RN: [name] Primary Care Provider: Thereasa Distance Other Clinician: Referring Provider: Thereasa Distance Treating Provider/Extender: Melburn Hake, HOYT Weeks in Treatment: 6 Verbal / Phone Orders: No Diagnosis Coding ICD-10 Coding Code Description I73.89 Other specified peripheral vascular diseases L97.818 Non-pressure chronic ulcer of other part of right lower leg with other specified severity N18.3 Chronic kidney disease, stage 3 (moderate) I48.20 Chronic atrial fibrillation, unspecified Wound Cleansing Wound #1 Right Achilles o Clean wound with Normal Saline. Primary Wound Dressing Wound #1 Right Achilles o Hydrogel - or [place] Jelly over exposed tendon to prevent tendon from drying out o Xeroform - over hydrogel to trap moisture on tendon Secondary Dressing Wound #1 Right Achilles o ABD and  Kerlix/Conform Dressing Change Frequency Wound #1 Right Achilles o Change dressing every other day. Follow-up Appointments Wound #1 Right Achilles o Return Appointment in 1 week. Home Health Wound #1 Right Achilles o Continue Home Health Visits o Home Health Nurse may visit PRN to address patientos wound care needs. o FACE TO FACE ENCOUNTER: MEDICARE and MEDICAID PATIENTS: I certify that this patient is under my care and that I had a face-to-face encounter that meets the physician face-to-face encounter requirements with this patient on this date. The encounter with the patient was in whole or in part for the following MEDICAL CONDITION: (primary reason for [place]) MEDICAL NECESSITY: I certify, that based on my findings, NURSING services are a medically necessary home health service. HOME BOUND STATUS: I certify that my clinical findings support that this patient is homebound (i.e., Due to illness or injury, pt requires aid of supportive devices such as crutches, cane, wheelchairs, walkers, the use of special transportation or the assistance of another person to leave their place of residence. There is a normal inability to leave the home and doing so requires considerable and taxing effort. Other absences are for medical reasons / religious services and are infrequent or of short duration when for other reasons). LYNNEL, ZANETTI ([id number]) o If current dressing causes regression in wound condition, may D/C ordered dressing product/s and apply Normal Saline Moist Dressing daily until next Wound Healing  Center / Other MD appointment. Unity of regression in wound condition at [phone]. o Please direct any NON-WOUND related issues/requests for orders to patient's Primary Care Physician Electronic Signature(s) Signed: 07/14/2018 3:08:04 PM By: [name] Signed: 07/14/2018 3:18:49 PM By: Worthy Keeler PA-C Entered By: [name] on  07/14/2018 10:56:02 Omlor, [name] ([id number]) -------------------------------------------------------------------------------- Problem List Details Patient Name: [name] Date of Service: 07/14/2018 9:45 AM Medical Record Number: [id number] Patient Account Number: [id number] Date of Birth/Sex: [birth year] (85 y.o. F) Treating RN: [name] Primary Care Provider: Thereasa Distance Other Clinician: Referring Provider: Thereasa Distance Treating Provider/Extender: Melburn Hake, HOYT Weeks in Treatment: 6 Active Problems ICD-10 Evaluated Encounter Code Description Active Date Today Diagnosis I73.89 Other specified peripheral vascular diseases 06/02/2018 No Yes L97.818 Non-pressure chronic ulcer of other part of right lower leg 06/02/2018 No Yes with other specified severity N18.3 Chronic kidney disease, stage 3 (moderate) 06/02/2018 No Yes I48.20 Chronic atrial fibrillation, unspecified 06/02/2018 No Yes Inactive Problems Resolved Problems Electronic Signature(s) Signed: 07/14/2018 3:18:49 PM By: Worthy Keeler PA-C Entered By: Worthy Keeler on 07/14/2018 09:27:38 Helming, [name] ([id number]) -------------------------------------------------------------------------------- Progress Note Details Patient Name: [name] Date of Service: 07/14/2018 9:45 AM Medical Record Number: [id number] Patient Account Number: [id number] Date of Birth/Sex: 12-29-1925 (85 y.o. F) Treating RN: [name] Primary Care Provider: Thereasa Distance Other Clinician: Referring Provider: Thereasa Distance Treating Provider/Extender: Melburn Hake, HOYT Weeks in Treatment: 6 Subjective Chief Complaint Information obtained from Patient Right Lower leg Achilles ulcer History of Present Illness (HPI) 06/02/18 patient presents today for initial evaluation our clinic concerning the ulcers that she has on the right Achilles location. 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 She was subsequently supposed to have had a scheduled likely stent placement in December 2019 unfortunately she had a fall with a sub dural hematoma which subsequently had to be evacuated partially at least. For that reason she has never gotten back in with Dr. Delana Meyer to have the vascular procedure. She does not have any appointment scheduled with him at this time. The patient does have a history of chronic kidney disease stage III, congestive heart failure, trophy relation, and at this point home health who has been coming out has been using [facility]. This is been since she's been home towards the end of December as best we can tell. She does have a hemoglobin A1c of 7.0 which was obtained on 11/18/17. Currently the patient's right lower extremity that showed diminished blood flow according to her arterial study which is dated 02/27/18. At that point her right ABI was 0.89 with a TBI of 0.36 and her left ABI was 1.13 with a TBI of 0.50. Her prior study which was in May on the 13th 2019 showed that she had a right ABI of one and a right TBI of 0.51 with a left ABI of 0.72 and a TBI of 0.50. Obviously since May till now her right leg has diminished as far as the blood flows concerned patient's son who was with her today states that Dr. 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 I do believe that for limb salvage this patient needs to have the above stated procedure with Dr. Delana Meyer as soon as possible. 06/09/18 on evaluation today patient actually appears to be doing about the same in regard to her Achilles ulcer. This actually does have tended exposed however  the tenant is remaining moist which is good news that's exactly what we want to see. Fortunately there's no signs of infection she does have an appointment with Dr. Delana Meyer this coming Tuesday, June 13, 2018. 06/16/18 on evaluation today patient actually appears to be doing rather well in  regard to her right lower extremity at this point. The Achilles ulcer actually showed signs of being a little bit better as far as the overall appearance of the wound was concerned today. Fortunately there did not appear to be the evidence of infection at this time which is good news. With that being said she did have her angiogram where she did actually have a significant blockage of the 90% into her lower extremity. Fortunately between the balloon angioplasty followed by stating this was able to be improved to less than 5% residual stenosis according to Dr. Nino Parsley note that I read. She still may have some limitation into her foot in particular as far as the runoff is concerned but nonetheless in general appears to be doing much better which is great news. Fortunately there is no sign of infection at this time. 06/23/18 on evaluation today patient's wound bed actually appears to be doing about the same although the central portion of the tendon actually appears to be somewhat dry this is definitely not good news. We want to try to keep this moist while allowing the edges of the wound hopefully start to cover the tendon that is the goal. Nonetheless I think that we may need to switch things up a little bit to try to improve the moisture trapping at this point. She does have better blood flow which is great we have applied for EpiFix although unfortunately as of right now this shows to be not covered. I think that we need to try to see about getting this approved as with the tenant expose me to do everything we can to try to get this covering of the skin as soon as possible. Again I'm not even sure that she'd be a candidate for plastic surgery intervention. 06/30/18 on evaluation today patient actually appears to be doing about the same in regard to her wound on the right Achilles region. The Achilles tendon is still exposed there does not appear to be any tissue growth around the edge while the  tendon itself does not appear to be quite as dry as it was previous there's definitely necrosis of the tenant beginning to be noted unfortunately. For that reason I think that we really need to make an appointment for her to see a specialist in order to discuss the options with her in regard to this one. Fortunately there does not appear to be any signs of systemic infection or even local Vega, Kenedee J. ([id number]) infection for that matter. 07/07/18 on evaluation today patient appears to be doing about the same in regard to her Achilles region also. There does not appear to be any signs of active infection at this time which is good news. With that being said her Achilles tendon is very dry and appears to be more so due to the fact that apparently the last dressing that was put on was used hydrogel with a Dry gauze of the top not what had been previously recommended. For that reason again the tendon does appear to be quite a bit more dry than what it was previous unfortunately. They still have not heard from orthopedics is for the referral for her Achilles region. We had made  this referral last week on Tuesday the sign still hadn't heard anything and the office told him that they did not have a referral from [place] we therefore reset the referral then he still has not heard anything. 07/14/18 on evaluation today patient appears to be doing about the same in regard to her right Achilles ulcer. She has been tolerating the dressing changes without complication. With that being said nothing seems to really improve significantly in my pinion. She did see the orthopedic doctor earlier this week they recommended a referral to Dr. Vickki Muff her podiatrist for further evaluation as well as a potential referral to plastic surgery although that is not scheduled as of yet. The patient is scheduled to see her podiatrist on Monday. There's no signs of active infection at this time. Patient History Information obtained  from Patient. Family History Cancer - Siblings, Heart Disease - Mother,Siblings,Father, Hypertension - Mother,Father,Siblings, Stroke - Father, No family history of Diabetes, Hereditary Spherocytosis, Kidney Disease, Lung Disease, Seizures, Thyroid Problems, Tuberculosis. Social History Never smoker, Marital Status - Married, Alcohol Use - Never, Drug Use - No History, Caffeine Use - Daily. Medical History Eyes Denies history of Cataracts, Glaucoma Ear/Nose/Mouth/Throat Denies history of Chronic sinus problems/congestion, Middle ear problems Hematologic/Lymphatic Patient has history of Anemia Denies history of Hemophilia, Human Immunodeficiency Virus, Lymphedema, Sickle Cell Disease Respiratory Denies history of Aspiration, Asthma, Chronic Obstructive Pulmonary Disease (COPD), Pneumothorax, Sleep Apnea, Tuberculosis Cardiovascular Patient has history of Arrhythmia - a fib, Congestive Heart Failure, Hypertension Denies history of Angina, Coronary Artery Disease, Deep Vein Thrombosis, Hypotension, Myocardial Infarction, Peripheral Arterial Disease, Peripheral Venous Disease, Phlebitis, Vasculitis Gastrointestinal Denies history of Cirrhosis , Colitis, Crohn s, Hepatitis A, Hepatitis B, Hepatitis C Endocrine Patient has history of Type II Diabetes Denies history of Type I Diabetes Genitourinary Patient has history of End Stage Renal Disease - CKD stage 3 Immunological Denies history of Lupus Erythematosus, Raynaud s, Scleroderma Integumentary (Skin) Denies history of History of Burn, History of pressure wounds Musculoskeletal Patient has history of Osteoarthritis Denies history of Gout, Rheumatoid Arthritis, Osteomyelitis Neurologic Denies history of Dementia, Neuropathy, Quadriplegia, Paraplegia, Seizure Disorder Oncologic QUANTIA, GRULLON ([id number]) Denies history of Received Chemotherapy, Received Radiation Psychiatric Denies history of Anorexia/bulimia, Confinement  Anxiety Medical And Surgical History Notes Cardiovascular HLD, mitral insufficiency Musculoskeletal 4 broken ribs Review of Systems (ROS) Constitutional Symptoms (General Health) Denies complaints or symptoms of Fever, Chills. Respiratory The patient has no complaints or symptoms. Psychiatric The patient has no complaints or symptoms. Objective Constitutional Well-nourished and well-hydrated in no acute distress. Vitals Time Taken: 10:00 AM, Height: 63 in, Weight: 102 lbs, BMI: 18.1, Temperature: 97.6 F, Pulse: 63 bpm, Respiratory Rate: 16 breaths/min, Blood Pressure: 137/62 mmHg. Respiratory normal breathing without difficulty. clear to auscultation bilaterally. Cardiovascular regular rate and rhythm with normal S1, S2. Psychiatric this patient is able to make decisions and demonstrates good insight into disease process. Alert and Oriented x 3. pleasant and cooperative. General Notes: At this point upon evaluation it does not appear that the patient is showing any signs of worsening although there's also no improvement. No sharp debridement was performed at this time. Integumentary (Hair, Skin) Wound #1 status is Open. Original cause of wound was Gradually Appeared. The wound is located on the Right Achilles. The wound measures 4cm length x 2.6cm width x 0.3cm depth; 8.168cm^2 area and 2.45cm^3 volume. There is Fat Layer (Subcutaneous Tissue) Exposed exposed. There is no tunneling or undermining noted. There is a medium amount of serosanguineous drainage  noted. The wound margin is flat and intact. There is no granulation within the wound bed. There is a large (67-100%) amount of necrotic tissue within the wound bed including Eschar and Adherent Slough. The periwound skin appearance exhibited: Callus, Dry/Scaly, Erythema. The periwound skin appearance did not exhibit: Crepitus, Excoriation, Induration, Rash, Scarring, Maceration, Atrophie Blanche, Cyanosis, Ecchymosis, Hemosiderin  Staining, Mottled, Pallor, Rubor. The surrounding wound skin color is noted with erythema which is circumferential. Periwound temperature was noted Kosh, Taronda J. ([id number]) as No Abnormality. The periwound has tenderness on palpation. Assessment Active Problems ICD-10 Other specified peripheral vascular diseases Non-pressure chronic ulcer of other part of right lower leg with other specified severity Chronic kidney disease, stage 3 (moderate) Chronic atrial fibrillation, unspecified Plan Wound Cleansing: Wound #1 Right Achilles: Clean wound with Normal Saline. Primary Wound Dressing: Wound #1 Right Achilles: Hydrogel - or [place] Jelly over exposed tendon to prevent tendon from drying out Xeroform - over hydrogel to trap moisture on tendon Secondary Dressing: Wound #1 Right Achilles: ABD and Kerlix/Conform Dressing Change Frequency: Wound #1 Right Achilles: Change dressing every other day. Follow-up Appointments: Wound #1 Right Achilles: Return Appointment in 1 week. Home Health: Wound #1 Right Achilles: [place] Nurse may visit PRN to address patient s wound care needs. FACE TO FACE ENCOUNTER: MEDICARE and MEDICAID PATIENTS: I certify that this patient is under my care and that I had a face-to-face encounter that meets the physician face-to-face encounter requirements with this patient on this date. The encounter with the patient was in whole or in part for the following MEDICAL CONDITION: (primary reason for [place]) MEDICAL NECESSITY: I certify, that based on my findings, NURSING services are a medically necessary home health service. HOME BOUND STATUS: I certify that my clinical findings support that this patient is homebound (i.e., Due to illness or injury, pt requires aid of supportive devices such as crutches, cane, wheelchairs, walkers, the use of special transportation or the assistance of another person to leave their place of  residence. There is a normal inability to leave the home and doing so requires considerable and taxing effort. Other absences are for medical reasons / religious services and are infrequent or of short duration when for other reasons). If current dressing causes regression in wound condition, may D/C ordered dressing product/s and apply Normal Saline Moist Dressing daily until next [place] / Other MD appointment. [place] of regression in wound condition at [phone]. Please direct any NON-WOUND related issues/requests for orders to patient's Primary Care Physician Barrilleaux, VEVA GRIMLEY ([id number]) I'm in a recommend that we continue with the above wound care measures for the time being. I did recommend that see in the podiatrist, Dr. Vickki Muff is probably a good idea. We will subsequently see what he has to say and depending on what he says we will see her next Friday she needs to cancel that appointment she always can depend on what Dr. Vickki Muff has planned for her. Otherwise all questions when Kirsten answered the best my ability today. Please see above for specific wound care orders. We will see patient for re-evaluation in 1 week(s) here in the clinic. If anything worsens or changes patient will contact our office for additional recommendations. Electronic Signature(s) Signed: 07/14/2018 3:18:49 PM By: Worthy Keeler PA-C Entered By: Worthy Keeler on 07/14/2018 15:07:42 Olheiser, [name] ([id number]) -------------------------------------------------------------------------------- ROS/PFSH Details Patient Name: [name] Date of Service: 07/14/2018 9:45 AM Medical Record  Number: [id number] Patient Account Number: [id number] Date of Birth/Sex: May 11, 1925 (85 y.o. F) Treating RN: [name] Primary Care Provider: Thereasa Distance Other Clinician: Referring Provider: Thereasa Distance Treating Provider/Extender: Melburn Hake, HOYT Weeks in Treatment: 6 Information  Obtained From Patient Wound History Do you currently have one or more open woundso Yes How many open wounds do you currently haveo 1 Approximately how long have you had your woundso 2 months How have you been treating your wound(s) until nowo medihoney Has your wound(s) ever healed and then re-openedo No Have you had any lab work done in the past montho No Have you tested positive for an antibiotic resistant organism (MRSA, VRE)o No Have you tested positive for osteomyelitis (bone infection)o No Have you had any tests for circulation on your legso Yes Where was the test doneo [facility] Constitutional Symptoms (General Health) Complaints and Symptoms: Negative for: Fever; Chills Eyes Medical History: Negative for: Cataracts; Glaucoma Ear/Nose/Mouth/Throat Medical History: Negative for: Chronic sinus problems/congestion; Middle ear problems Hematologic/Lymphatic Medical History: Positive for: Anemia Negative for: Hemophilia; Human Immunodeficiency Virus; Lymphedema; Sickle Cell Disease Respiratory Complaints and Symptoms: No Complaints or Symptoms Medical History: Negative for: Aspiration; Asthma; Chronic Obstructive Pulmonary Disease (COPD); Pneumothorax; Sleep Apnea; Tuberculosis Cardiovascular Medical History: Positive for: Arrhythmia - a fib; Congestive Heart Failure; Hypertension Negative for: Angina; Coronary Artery Disease; Deep Vein Thrombosis; Hypotension; Myocardial Infarction; Peripheral Holsapple, Laquitta J. ([id number]) Arterial Disease; Peripheral Venous Disease; Phlebitis; Vasculitis Past Medical History Notes: HLD, mitral insufficiency Gastrointestinal Medical History: Negative for: Cirrhosis ; Colitis; Crohnos; Hepatitis A; Hepatitis B; Hepatitis C Endocrine Medical History: Positive for: Type II Diabetes Negative for: Type I Diabetes Treated with: Insulin Blood sugar tested every day: Yes Tested : Genitourinary Medical History: Positive for: End Stage Renal  Disease - CKD stage 3 Immunological Medical History: Negative for: Lupus Erythematosus; Raynaudos; Scleroderma Integumentary (Skin) Medical History: Negative for: History of Burn; History of pressure wounds Musculoskeletal Medical History: Positive for: Osteoarthritis Negative for: Gout; Rheumatoid Arthritis; Osteomyelitis Past Medical History Notes: 4 broken ribs Neurologic Medical History: Negative for: Dementia; Neuropathy; Quadriplegia; Paraplegia; Seizure Disorder Oncologic Medical History: Negative for: Received Chemotherapy; Received Radiation Psychiatric Complaints and Symptoms: No Complaints or Symptoms Medical History: Negative for: Anorexia/bulimia; Confinement Anxiety Glockner, [place] J. ([id number]) Immunizations Pneumococcal Vaccine: Received Pneumococcal Vaccination: Yes Immunization Notes: up to date Implantable Devices No devices added Family and Social History Cancer: Yes - Siblings; Diabetes: No; Heart Disease: Yes - Mother,Siblings,Father; Hereditary Spherocytosis: No; Hypertension: Yes - Mother,Father,Siblings; Kidney Disease: No; Lung Disease: No; Seizures: No; Stroke: Yes - Father; Thyroid Problems: No; Tuberculosis: No; Never smoker; Marital Status - Married; Alcohol Use: Never; Drug Use: No History; Caffeine Use: Daily; Financial Concerns: No; Food, Clothing or Shelter Needs: No; Support System Lacking: No; Transportation Concerns: No; Advanced Directives: Yes (Not Provided); Patient does not want information on Advanced Directives; Medical Power of Attorney: Yes - Leeya Rusconi Joaquim Lai (Not Provided) Physician Affirmation I have reviewed and agree with the above information. Electronic Signature(s) Signed: 07/14/2018 3:08:04 PM By: [name] Signed: 07/14/2018 3:18:49 PM By: Worthy Keeler PA-C Entered By: Worthy Keeler on 07/14/2018 15:06:12 [place]asper, [name]  ([id number]) -------------------------------------------------------------------------------- SuperBill Details Patient Name: [name] Date of Service: 07/14/2018 Medical Record Number: [id number] Patient Account Number: [id number] Date of Birth/Sex: August 09, 1925 (85 y.o. F) Treating RN: [name] Primary Care Provider: Thereasa Distance Other Clinician: Referring Provider: Thereasa Distance Treating Provider/Extender: Melburn Hake, HOYT Weeks in Treatment: 6 Diagnosis Coding ICD-10 Codes Code Description I73.89 Other  specified peripheral vascular diseases L97.818 Non-pressure chronic ulcer of other part of right lower leg with other specified severity N18.3 Chronic kidney disease, stage 3 (moderate) I48.20 Chronic atrial fibrillation, unspecified Facility Procedures CPT4 Code: 92780044 Description: 99213 - WOUND CARE VISIT-LEV 3 EST PT Modifier: Quantity: 1 Physician Procedures CPT4 Code Description: [id number] [id number] - WC PHYS LEVEL 4 - EST PT ICD-10 Diagnosis Description I73.89 Other specified peripheral vascular diseases L97.818 Non-pressure chronic ulcer of other part of right lower leg wit N18.3 Chronic kidney disease, stage 3  (moderate) I48.20 Chronic atrial fibrillation, unspecified Modifier: h other specified Quantity: 1 severity Electronic Signature(s) Signed: 07/14/2018 3:18:49 PM By: Worthy Keeler PA-C Entered By: Worthy Keeler on 07/14/2018 15:07:54

## 2018-07-21 ENCOUNTER — Other Ambulatory Visit: Payer: Self-pay

## 2018-07-21 ENCOUNTER — Encounter: Payer: Medicare Other | Admitting: Physician Assistant

## 2018-07-21 DIAGNOSIS — E11622 Type 2 diabetes mellitus with other skin ulcer: Secondary | ICD-10-CM | POA: Diagnosis not present

## 2018-07-21 NOTE — Progress Notes (Signed)
BRITANI, BEATTIE ([id number]) Visit Report for 07/21/2018 Chief Complaint Document Details Patient Name: [name], [name] Date of Service: 07/21/2018 9:15 AM Medical Record Number: [id number] Patient Account Number: [id number] Date of Birth/Sex: 1925/05/24 (85 y.o. F) Treating RN: [name] Primary Care Provider: Thereasa Distance Other Clinician: Referring Provider: Thereasa Distance Treating Provider/Extender: Melburn Hake, Evaleen Sant Weeks in Treatment: 7 Information Obtained from: Patient Chief Complaint Right Lower leg Achilles ulcer Electronic Signature(s) Signed: 07/21/2018 12:23:18 PM By: Worthy Keeler PA-C Entered By: Worthy Keeler on 07/21/2018 09:32:04 Turney, [name] ([id number]) -------------------------------------------------------------------------------- Debridement Details Patient Name: [name] Date of Service: 07/21/2018 9:15 AM Medical Record Number: [id number] Patient Account Number: [id number] Date of Birth/Sex: Nov 08, 1925 (85 y.o. F) Treating RN: [name] Primary Care Provider: Thereasa Distance Other Clinician: Referring Provider: Thereasa Distance Treating Provider/Extender: Melburn Hake, Enedina Pair Weeks in Treatment: 7 Debridement Performed for Wound #1 Right Achilles Assessment: Performed By: Physician STONE III, Leoncio Hansen E., PA-C Debridement Type: Chemical/Enzymatic/Mechanical Agent Used: Santyl Severity of Tissue Pre Necrosis of muscle Debridement: Level of Consciousness (Pre- Awake and Alert procedure): Pre-procedure Verification/Time Yes - 09:46 Out Taken: Start Time: 09:46 Pain Control: Lidocaine 4% Topical Solution Instrument: Other : tongue blade Bleeding: None End Time: 09:47 Procedural Pain: 0 Post Procedural Pain: 0 Response to Treatment: Procedure was tolerated well Level of Consciousness Awake and Alert (Post-procedure): Post Debridement Measurements of Total Wound Length: (cm) 4 Width: (cm) 2.6 Depth: (cm) 0.3 Volume: (cm)  2.45 Character of Wound/Ulcer Post Debridement: Requires Further Debridement Severity of Tissue Post Debridement: Necrosis of muscle Post Procedure Diagnosis Same as Pre-procedure Electronic Signature(s) Signed: 07/21/2018 12:23:18 PM By: Worthy Keeler PA-C Signed: 07/21/2018 2:25:55 PM By: [name] Entered By: [name] on 07/21/2018 09:45:01 Fohl, [name] ([id number]) -------------------------------------------------------------------------------- HPI Details Patient Name: [name] Date of Service: 07/21/2018 9:15 AM Medical Record Number: [id number] Patient Account Number: [id number] Date of Birth/Sex: 1926-02-20 (85 y.o. F) Treating RN: [name] Primary Care Provider: Thereasa Distance Other Clinician: Referring Provider: Thereasa Distance Treating Provider/Extender: Melburn Hake, Rosalia Mcavoy Weeks in Treatment: 7 History of Present Illness HPI Description: 06/02/18 patient presents today for initial evaluation our clinic concerning the ulcers that she has on the right Achilles location. 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 She was subsequently supposed to have had a scheduled likely stent placement in December 2019 unfortunately she had a fall with a sub dural hematoma which subsequently had to be evacuated partially at least. For that reason she has never gotten back in with Dr. Delana Meyer to have the vascular procedure. She does not have any appointment scheduled with him at this time. The patient does have a history of chronic kidney disease stage III, congestive heart failure, trophy relation, and at this point home health who has been coming out has been using [facility]. This is been since she's been home towards the end of December as best we can tell. She does have a hemoglobin A1c of 7.0 which was obtained on 11/18/17. Currently the patient's right lower extremity that showed diminished blood flow  according to her arterial study which is dated 02/27/18. At that point her right ABI was 0.89 with a TBI of 0.36 and her left ABI was 1.13 with a TBI of 0.50. Her prior study which was in May on the 13th 2019 showed that she had a right ABI of one and a right TBI of 0.51 with a left ABI  of 0.72 and a TBI of 0.50. Obviously since May till now her right leg has diminished as far as the blood flows concerned patient's son who was with her today states that Dr. 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 I do believe that for limb salvage this patient needs to have the above stated procedure with Dr. Delana Meyer as soon as possible. 06/09/18 on evaluation today patient actually appears to be doing about the same in regard to her Achilles ulcer. This actually does have tended exposed however the tenant is remaining moist which is good news that's exactly what we want to see. Fortunately there's no signs of infection she does have an appointment with Dr. Delana Meyer this coming Tuesday, June 13, 2018. 06/16/18 on evaluation today patient actually appears to be doing rather well in regard to her right lower extremity at this point. The Achilles ulcer actually showed signs of being a little bit better as far as the overall appearance of the wound was concerned today. Fortunately there did not appear to be the evidence of infection at this time which is good news. With that being said she did have her angiogram where she did actually have a significant blockage of the 90% into her lower extremity. Fortunately between the balloon angioplasty followed by stating this was able to be improved to less than 5% residual stenosis according to Dr. Nino Parsley note that I read. She still may have some limitation into her foot in particular as far as the runoff is concerned but nonetheless in general appears to be doing much better which is great news. Fortunately there is no sign of infection at this  time. 06/23/18 on evaluation today patient's wound bed actually appears to be doing about the same although the central portion of the tendon actually appears to be somewhat dry this is definitely not good news. We want to try to keep this moist while allowing the edges of the wound hopefully start to cover the tendon that is the goal. Nonetheless I think that we may need to switch things up a little bit to try to improve the moisture trapping at this point. She does have better blood flow which is great we have applied for EpiFix although unfortunately as of right now this shows to be not covered. I think that we need to try to see about getting this approved as with the tenant expose me to do everything we can to try to get this covering of the skin as soon as possible. Again I'm not even sure that she'd be a candidate for plastic surgery intervention. 06/30/18 on evaluation today patient actually appears to be doing about the same in regard to her wound on the right Achilles region. The Achilles tendon is still exposed there does not appear to be any tissue growth around the edge while the tendon itself does not appear to be quite as dry as it was previous there's definitely necrosis of the tenant beginning to be noted unfortunately. For that reason I think that we really need to make an appointment for her to see a specialist in order to discuss the options with her in regard to this one. Fortunately there does not appear to be any signs of systemic infection or even local infection for that matter. 07/07/18 on evaluation today patient appears to be doing about the same in regard to her Achilles region also. There does not appear to be any signs of active infection at  this time which is good news. With that being said her Achilles tendon is very dry and appears to be more so due to the fact that apparently the last dressing that was put on was used hydrogel with a Dry gauze of the top not what had  been previously recommended. For that reason again the tendon does appear to be quite a bit Delman, Addi J. ([id number]) more dry than what it was previous unfortunately. They still have not heard from orthopedics is for the referral for her Achilles region. We had made this referral last week on Tuesday the sign still hadn't heard anything and the office told him that they did not have a referral from [place] we therefore reset the referral then he still has not heard anything. 07/14/18 on evaluation today patient appears to be doing about the same in regard to her right Achilles ulcer. She has been tolerating the dressing changes without complication. With that being said nothing seems to really improve significantly in my pinion. She did see the orthopedic doctor earlier this week they recommended a referral to Dr. Vickki Muff her podiatrist for further evaluation as well as a potential referral to plastic surgery although that is not scheduled as of yet. The patient is scheduled to see her podiatrist on Monday. There's no signs of active infection at this time. 07/21/18 on evaluation today patient was actually seen on 07/17/18 by Dr. Vickki Muff who is a local podiatrist. Subsequently he did sharply debride in the office the wound to some degree to try to clear off the eschar and see how much of the tenant underlying was actually damaged. He however felt that with the patient really needed was more significant debridement of the Achilles tendon to clear this away and that a Wound VAC following. However he considers this a semi-elective surgery which subsequently the hospitals not allowing at this point. Nonetheless the patient son was present during the evaluation that she had there with the podiatrist and did we count that exactly what I was saying was exactly what he remembered as well. No fevers, chills, nausea, or vomiting noted at this time. Electronic Signature(s) Signed: 07/21/2018 12:23:18 PM By: Worthy Keeler PA-C Entered By: Worthy Keeler on 07/21/2018 09:52:20 Thai, [name] ([id number]) -------------------------------------------------------------------------------- Physical Exam Details Patient Name: [name] Date of Service: 07/21/2018 9:15 AM Medical Record Number: [id number] Patient Account Number: [id number] Date of Birth/Sex: [birth year] (85 y.o. F) Treating RN: [name] Primary Care Provider: Thereasa Distance Other Clinician: Referring Provider: Thereasa Distance Treating Provider/Extender: Melburn Hake, Aarik Blank Weeks in Treatment: 7 Constitutional Well-nourished and well-hydrated in no acute distress. Respiratory normal breathing without difficulty. clear to auscultation bilaterally. Cardiovascular regular rate and rhythm with normal S1, S2. Psychiatric this patient is able to make decisions and demonstrates good insight into disease process. Alert and Oriented x 3. pleasant and cooperative. Notes Patient's wound bed currently still shows necrotic Achilles tendon noted over the surface of the wound there does not appear to be any good viable tissue in the central portion of this wound. Nonetheless she states that the pain is quite a bit more compared to what it was previous since Dr. Vickki Muff debrided this area. Obviously this is to be expected. Electronic Signature(s) Signed: 07/21/2018 12:23:18 PM By: Worthy Keeler PA-C Entered By: Worthy Keeler on 07/21/2018 09:52:48 Alford, [name] ([id number]) -------------------------------------------------------------------------------- Physician Orders Details Patient Name: [name] Date of Service: 07/21/2018 9:15 AM Medical Record Number: [id number] Patient Account  Number: [id number] Date of Birth/Sex: June 19, 1925 (85 y.o. F) Treating RN: [name] Primary Care Provider: Thereasa Distance Other Clinician: Referring Provider: Thereasa Distance Treating Provider/Extender: Melburn Hake, Mahir Prabhakar Weeks in Treatment: 7 Verbal  / Phone Orders: No Diagnosis Coding ICD-10 Coding Code Description I73.89 Other specified peripheral vascular diseases L97.818 Non-pressure chronic ulcer of other part of right lower leg with other specified severity N18.3 Chronic kidney disease, stage 3 (moderate) I48.20 Chronic atrial fibrillation, unspecified Wound Cleansing Wound #1 Right Achilles o Clean wound with Normal Saline. Primary Wound Dressing Wound #1 Right Achilles o Santyl Ointment o Xeroform - over santyl to trap moisture on tendon Secondary Dressing Wound #1 Right Achilles o ABD and Kerlix/Conform Dressing Change Frequency Wound #1 Right Achilles o Change dressing every day. Follow-up Appointments Wound #1 Right Achilles o Return Appointment in 1 week. Home Health Wound #1 Right Achilles o Continue Home Health Visits o Home Health Nurse may visit PRN to address patientos wound care needs. o FACE TO FACE ENCOUNTER: MEDICARE and MEDICAID PATIENTS: I certify that this patient is under my care and that I had a face-to-face encounter that meets the physician face-to-face encounter requirements with this patient on this date. The encounter with the patient was in whole or in part for the following MEDICAL CONDITION: (primary reason for [place]) MEDICAL NECESSITY: I certify, that based on my findings, NURSING services are a medically necessary home health service. HOME BOUND STATUS: I certify that my clinical findings support that this patient is homebound (i.e., Due to illness or injury, pt requires aid of supportive devices such as crutches, cane, wheelchairs, walkers, the use of special transportation or the assistance of another person to leave their place of residence. There is a normal inability to leave the home and doing so requires considerable and taxing effort. Other absences are for medical reasons / religious services and are infrequent or of short duration when for other  reasons). KIANNA, BILLET ([id number]) o If current dressing causes regression in wound condition, may D/C ordered dressing product/s and apply Normal Saline Moist Dressing daily until next [place] / Other MD appointment. [place] of regression in wound condition at [phone]. o Please direct any NON-WOUND related issues/requests for orders to patient's Primary Care Physician Medications-please add to medication list. Wound #1 Right Achilles o Santyl Enzymatic Ointment Electronic Signature(s) Signed: 07/21/2018 12:23:18 PM By: Worthy Keeler PA-C Signed: 07/21/2018 2:25:55 PM By: [name] Entered By: [name] on 07/21/2018 09:45:46 Dambach, [name] ([id number]) -------------------------------------------------------------------------------- Problem List Details Patient Name: [name] Date of Service: 07/21/2018 9:15 AM Medical Record Number: [id number] Patient Account Number: [id number] Date of Birth/Sex: 1926/01/13 (85 y.o. F) Treating RN: [name] Primary Care Provider: Thereasa Distance Other Clinician: Referring Provider: Thereasa Distance Treating Provider/Extender: Melburn Hake, Kapri Nero Weeks in Treatment: 7 Active Problems ICD-10 Evaluated Encounter Code Description Active Date Today Diagnosis I73.89 Other specified peripheral vascular diseases 06/02/2018 No Yes L97.818 Non-pressure chronic ulcer of other part of right lower leg 06/02/2018 No Yes with other specified severity N18.3 Chronic kidney disease, stage 3 (moderate) 06/02/2018 No Yes I48.20 Chronic atrial fibrillation, unspecified 06/02/2018 No Yes Inactive Problems Resolved Problems Electronic Signature(s) Signed: 07/21/2018 12:23:18 PM By: Worthy Keeler PA-C Entered By: Worthy Keeler on 07/21/2018 09:32:00 Husmann, [name] ([id number]) -------------------------------------------------------------------------------- Progress Note Details Patient Name: [name] Date of Service: 07/21/2018 9:15 AM Medical Record Number: [id number] Patient Account Number: [id number] Date of Birth/Sex: 20-Aug-1925 (85  y.o. F) Treating RN: [name] Primary Care Provider: Thereasa Distance Other Clinician: Referring Provider: Thereasa Distance Treating Provider/Extender: Melburn Hake, Jaycee Mckellips Weeks in Treatment: 7 Subjective Chief Complaint Information obtained from Patient Right Lower leg Achilles ulcer History of Present Illness (HPI) 06/02/18 patient presents today for initial evaluation our clinic concerning the ulcers that she has on the right Achilles location. 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 She was subsequently supposed to have had a scheduled likely stent placement in December 2019 unfortunately she had a fall with a sub dural hematoma which subsequently had to be evacuated partially at least. For that reason she has never gotten back in with Dr. Delana Meyer to have the vascular procedure. She does not have any appointment scheduled with him at this time. The patient does have a history of chronic kidney disease stage III, congestive heart failure, trophy relation, and at this point home health who has been coming out has been using [facility]. This is been since she's been home towards the end of December as best we can tell. She does have a hemoglobin A1c of 7.0 which was obtained on 11/18/17. Currently the patient's right lower extremity that showed diminished blood flow according to her arterial study which is dated 02/27/18. At that point her right ABI was 0.89 with a TBI of 0.36 and her left ABI was 1.13 with a TBI of 0.50. Her prior study which was in May on the 13th 2019 showed that she had a right ABI of one and a right TBI of 0.51 with a left ABI of 0.72 and a TBI of 0.50. Obviously since May till now her right leg has diminished as far as the blood flows concerned patient's son who was  with her today states that Dr. 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 I do believe that for limb salvage this patient needs to have the above stated procedure with Dr. Delana Meyer as soon as possible. 06/09/18 on evaluation today patient actually appears to be doing about the same in regard to her Achilles ulcer. This actually does have tended exposed however the tenant is remaining moist which is good news that's exactly what we want to see. Fortunately there's no signs of infection she does have an appointment with Dr. Delana Meyer this coming Tuesday, June 13, 2018. 06/16/18 on evaluation today patient actually appears to be doing rather well in regard to her right lower extremity at this point. The Achilles ulcer actually showed signs of being a little bit better as far as the overall appearance of the wound was concerned today. Fortunately there did not appear to be the evidence of infection at this time which is good news. With that being said she did have her angiogram where she did actually have a significant blockage of the 90% into her lower extremity. Fortunately between the balloon angioplasty followed by stating this was able to be improved to less than 5% residual stenosis according to Dr. Nino Parsley note that I read. She still may have some limitation into her foot in particular as far as the runoff is concerned but nonetheless in general appears to be doing much better which is great news. Fortunately there is no sign of infection at this time. 06/23/18 on evaluation today patient's wound bed actually appears to be doing about the same although the central portion of the tendon actually appears to be somewhat dry this is definitely  not good news. We want to try to keep this moist while allowing the edges of the wound hopefully start to cover the tendon that is the goal. Nonetheless I think that we may need to switch things up a little bit to try to improve the  moisture trapping at this point. She does have better blood flow which is great we have applied for EpiFix although unfortunately as of right now this shows to be not covered. I think that we need to try to see about getting this approved as with the tenant expose me to do everything we can to try to get this covering of the skin as soon as possible. Again I'm not even sure that she'd be a candidate for plastic surgery intervention. 06/30/18 on evaluation today patient actually appears to be doing about the same in regard to her wound on the right Achilles region. The Achilles tendon is still exposed there does not appear to be any tissue growth around the edge while the tendon itself does not appear to be quite as dry as it was previous there's definitely necrosis of the tenant beginning to be noted unfortunately. For that reason I think that we really need to make an appointment for her to see a specialist in order to discuss the options with her in regard to this one. Fortunately there does not appear to be any signs of systemic infection or even local Culbreth, Lenzie J. ([id number]) infection for that matter. 07/07/18 on evaluation today patient appears to be doing about the same in regard to her Achilles region also. There does not appear to be any signs of active infection at this time which is good news. With that being said her Achilles tendon is very dry and appears to be more so due to the fact that apparently the last dressing that was put on was used hydrogel with a Dry gauze of the top not what had been previously recommended. For that reason again the tendon does appear to be quite a bit more dry than what it was previous unfortunately. They still have not heard from orthopedics is for the referral for her Achilles region. We had made this referral last week on Tuesday the sign still hadn't heard anything and the office told him that they did not have a referral from [place] we therefore reset the  referral then he still has not heard anything. 07/14/18 on evaluation today patient appears to be doing about the same in regard to her right Achilles ulcer. She has been tolerating the dressing changes without complication. With that being said nothing seems to really improve significantly in my pinion. She did see the orthopedic doctor earlier this week they recommended a referral to Dr. Vickki Muff her podiatrist for further evaluation as well as a potential referral to plastic surgery although that is not scheduled as of yet. The patient is scheduled to see her podiatrist on Monday. There's no signs of active infection at this time. 07/21/18 on evaluation today patient was actually seen on 07/17/18 by Dr. Vickki Muff who is a local podiatrist. Subsequently he did sharply debride in the office the wound to some degree to try to clear off the eschar and see how much of the tenant underlying was actually damaged. He however felt that with the patient really needed was more significant debridement of the Achilles tendon to clear this away and that a Wound VAC following. However he considers this a semi-elective surgery which subsequently the hospitals not allowing  at this point. Nonetheless the patient son was present during the evaluation that she had there with the podiatrist and did we count that exactly what I was saying was exactly what he remembered as well. No fevers, chills, nausea, or vomiting noted at this time. Patient History Information obtained from Patient. Family History Cancer - Siblings, Heart Disease - Mother,Siblings,Father, Hypertension - Mother,Father,Siblings, Stroke - Father, No family history of Diabetes, Hereditary Spherocytosis, Kidney Disease, Lung Disease, Seizures, Thyroid Problems, Tuberculosis. Social History Never smoker, Marital Status - Married, Alcohol Use - Never, Drug Use - No History, Caffeine Use - Daily. Medical History Eyes Denies history of Cataracts,  Glaucoma Ear/Nose/Mouth/Throat Denies history of Chronic sinus problems/congestion, Middle ear problems Hematologic/Lymphatic Patient has history of Anemia Denies history of Hemophilia, Human Immunodeficiency Virus, Lymphedema, Sickle Cell Disease Respiratory Denies history of Aspiration, Asthma, Chronic Obstructive Pulmonary Disease (COPD), Pneumothorax, Sleep Apnea, Tuberculosis Cardiovascular Patient has history of Arrhythmia - a fib, Congestive Heart Failure, Hypertension Denies history of Angina, Coronary Artery Disease, Deep Vein Thrombosis, Hypotension, Myocardial Infarction, Peripheral Arterial Disease, Peripheral Venous Disease, Phlebitis, Vasculitis Gastrointestinal Denies history of Cirrhosis , Colitis, Crohn s, Hepatitis A, Hepatitis B, Hepatitis C Endocrine Patient has history of Type II Diabetes Denies history of Type I Diabetes Genitourinary Patient has history of End Stage Renal Disease - CKD stage 3 Immunological Denies history of Lupus Erythematosus, Raynaud s, Scleroderma Benavidez, Brownie J. ([id number]) Integumentary (Skin) Denies history of History of Burn, History of pressure wounds Musculoskeletal Patient has history of Osteoarthritis Denies history of Gout, Rheumatoid Arthritis, Osteomyelitis Neurologic Denies history of Dementia, Neuropathy, Quadriplegia, Paraplegia, Seizure Disorder Oncologic Denies history of Received Chemotherapy, Received Radiation Psychiatric Denies history of Anorexia/bulimia, Confinement Anxiety Medical And Surgical History Notes Cardiovascular HLD, mitral insufficiency Musculoskeletal 4 broken ribs Review of Systems (ROS) Constitutional Symptoms (General Health) Denies complaints or symptoms of Fatigue, Fever, Chills, Marked Weight Change. Respiratory Denies complaints or symptoms of Chronic or frequent coughs, Shortness of Breath. Cardiovascular Denies complaints or symptoms of Chest pain, LE edema. Psychiatric Denies  complaints or symptoms of Anxiety, Claustrophobia. Objective Constitutional Well-nourished and well-hydrated in no acute distress. Vitals Time Taken: 9:15 AM, Height: 63 in, Weight: 102 lbs, BMI: 18.1, Temperature: 97.6 F, Pulse: 63 bpm, Respiratory Rate: 16 breaths/min, Blood Pressure: 152/54 mmHg. Respiratory normal breathing without difficulty. clear to auscultation bilaterally. Cardiovascular regular rate and rhythm with normal S1, S2. Psychiatric this patient is able to make decisions and demonstrates good insight into disease process. Alert and Oriented x 3. pleasant and cooperative. General Notes: Patient's wound bed currently still shows necrotic Achilles tendon noted over the surface of the wound there does not appear to be any good viable tissue in the central portion of this wound. Nonetheless she states that the pain is quite a bit more compared to what it was previous since Dr. Vickki Muff debrided this area. Obviously this is to be expected. TYHESHA, DUTSON. ([id number]) Integumentary (Hair, Skin) Wound #1 status is Open. Original cause of wound was Gradually Appeared. The wound is located on the Right Achilles. The wound measures 4cm length x 2.6cm width x 0.3cm depth; 8.168cm^2 area and 2.45cm^3 volume. There is Fat Layer (Subcutaneous Tissue) Exposed exposed. There is no tunneling or undermining noted. There is a medium amount of serosanguineous drainage noted. The wound margin is flat and intact. There is no granulation within the wound bed. There is a large (67-100%) amount of necrotic tissue within the wound bed including Eschar and Adherent Slough. The periwound  skin appearance exhibited: Callus, Dry/Scaly, Erythema. The periwound skin appearance did not exhibit: Crepitus, Excoriation, Induration, Rash, Scarring, Maceration, Atrophie Blanche, Cyanosis, Ecchymosis, Hemosiderin Staining, Mottled, Pallor, Rubor. The surrounding wound skin color is noted with erythema which is  circumferential. Periwound temperature was noted as No Abnormality. The periwound has tenderness on palpation. 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 There was a Chemical/Enzymatic/Mechanical debridement performed by STONE III, Shiza Thelen E., PA-C. With the following instrument(s): tongue blade after achieving pain control using Lidocaine 4% Topical Solution. Agent used was [employer]. A time out was conducted at 09:46, prior to the start of the procedure. There was no bleeding. The procedure was tolerated well with a pain level of 0 throughout and a pain level of 0 following the procedure. Post Debridement Measurements: 4cm length x 2.6cm width x 0.3cm depth; 2.45cm^3 volume. Character of Wound/Ulcer Post Debridement requires further debridement. Severity of Tissue Post Debridement is: Necrosis of muscle. Post procedure Diagnosis Wound #1: Same as Pre-Procedure Plan Wound Cleansing: Wound #1 Right Achilles: Clean wound with Normal Saline. Primary Wound Dressing: Wound #1 Right Achilles: Santyl Ointment Xeroform - over santyl to trap moisture on tendon Secondary Dressing: KAEDE, CLENDENEN. ([id number]) Wound #1 Right Achilles: ABD and Kerlix/Conform Dressing Change Frequency: Wound #1 Right Achilles: Change dressing every day. Follow-up Appointments: Wound #1 Right Achilles: Return Appointment in 1 week. Home Health: Wound #1 Right Achilles: [place] Nurse may visit PRN to address patient s wound care needs. FACE TO FACE ENCOUNTER: MEDICARE and MEDICAID PATIENTS: I  certify that this patient is under my care and that I had a face-to-face encounter that meets the physician face-to-face encounter requirements with this patient on this date. The encounter with the patient was in whole or in part for the following MEDICAL CONDITION: (primary reason for [place]) MEDICAL NECESSITY: I certify, that based on my findings, NURSING services are a medically necessary home health service. HOME BOUND STATUS: I certify that my clinical findings support that this patient is homebound (i.e., Due to illness or injury, pt requires aid of supportive devices such as crutches, cane, wheelchairs, walkers, the use of special transportation or the assistance of another person to leave their place of residence. There is a normal inability to leave the home and doing so requires considerable and taxing effort. Other absences are for medical reasons / religious services and are infrequent or of short duration when for other reasons). If current dressing causes regression in wound condition, may D/C ordered dressing product/s and apply Normal Saline Moist Dressing daily until next [place] / Other MD appointment. [place] of regression in wound condition at [phone]. Please direct any NON-WOUND related issues/requests for orders to patient's Primary Care Physician Medications-please add to medication list.: Wound #1 Right Achilles: Santyl Enzymatic Ointment My suggestion at this point is gonna be that we continue with the above wound care measures for the next week and the patient is in agreement the plan. We will subsequently see were things stand at follow-up. She will be seeing Dr. Vickki Muff two weeks will see were things stand and how she's doing that point what may or may  not need to be done at that time as well. She's in agreement that plan. Please see above for specific wound care orders. We will see patient for re-evaluation in 1 week(s)  here in the clinic. If anything worsens or changes patient will contact our office for additional recommendations. Electronic Signature(s) Signed: 07/21/2018 12:23:18 PM By: Worthy Keeler PA-C Entered By: Worthy Keeler on 07/21/2018 09:53:20 Harkleroad, [name] ([id number]) -------------------------------------------------------------------------------- ROS/PFSH Details Patient Name: [name] Date of Service: 07/21/2018 9:15 AM Medical Record Number: [id number] Patient Account Number: [id number] Date of Birth/Sex: April 30, 1925 (85 y.o. F) Treating RN: [name] Primary Care Provider: Thereasa Distance Other Clinician: Referring Provider: Thereasa Distance Treating Provider/Extender: Melburn Hake, Analyce Tavares Weeks in Treatment: 7 Information Obtained From Patient Constitutional Symptoms (General Health) Complaints and Symptoms: Negative for: Fatigue; Fever; Chills; Marked Weight Change Respiratory Complaints and Symptoms: Negative for: Chronic or frequent coughs; Shortness of Breath Medical History: Negative for: Aspiration; Asthma; Chronic Obstructive Pulmonary Disease (COPD); Pneumothorax; Sleep Apnea; Tuberculosis Cardiovascular Complaints and Symptoms: Negative for: Chest pain; LE edema Medical History: Positive for: Arrhythmia - a fib; Congestive Heart Failure; Hypertension Negative for: Angina; Coronary Artery Disease; Deep Vein Thrombosis; Hypotension; Myocardial Infarction; Peripheral Arterial Disease; Peripheral Venous Disease; Phlebitis; Vasculitis Past Medical History Notes: HLD, mitral insufficiency Psychiatric Complaints and Symptoms: Negative for: Anxiety; Claustrophobia Medical History: Negative for: Anorexia/bulimia; Confinement Anxiety Eyes Medical History: Negative for: Cataracts; Glaucoma Ear/Nose/Mouth/Throat Medical History: Negative for: Chronic sinus problems/congestion; Middle ear problems Hematologic/Lymphatic Medical History: Positive for:  Anemia Laino, Vonda J. ([id number]) Negative for: Hemophilia; Human Immunodeficiency Virus; Lymphedema; Sickle Cell Disease Gastrointestinal Medical History: Negative for: Cirrhosis ; Colitis; Crohnos; Hepatitis A; Hepatitis B; Hepatitis C Endocrine Medical History: Positive for: Type II Diabetes Negative for: Type I Diabetes Treated with: Insulin Blood sugar tested every day: Yes Tested : Genitourinary Medical History: Positive for: End Stage Renal Disease - CKD stage 3 Immunological Medical History: Negative for: Lupus Erythematosus; Raynaudos; Scleroderma Integumentary (Skin) Medical History: Negative for: History of Burn; History of pressure wounds Musculoskeletal Medical History: Positive for: Osteoarthritis Negative for: Gout; Rheumatoid Arthritis; Osteomyelitis Past Medical History Notes: 4 broken ribs Neurologic Medical History: Negative for: Dementia; Neuropathy; Quadriplegia; Paraplegia; Seizure Disorder Oncologic Medical History: Negative for: Received Chemotherapy; Received Radiation Immunizations Pneumococcal Vaccine: Received Pneumococcal Vaccination: Yes Immunization Notes: up to date Implantable Devices No devices added Family and Social History Mcnelly, KELCIE CURRIE. ([id number]) Cancer: Yes - Siblings; Diabetes: No; Heart Disease: Yes - Mother,Siblings,Father; Hereditary Spherocytosis: No; Hypertension: Yes - Mother,Father,Siblings; Kidney Disease: No; Lung Disease: No; Seizures: No; Stroke: Yes - Father; Thyroid Problems: No; Tuberculosis: No; Never smoker; Marital Status - Married; Alcohol Use: Never; Drug Use: No History; Caffeine Use: Daily; Financial Concerns: No; Food, Clothing or Shelter Needs: No; Support System Lacking: No; Transportation Concerns: No Physician Affirmation I have reviewed and agree with the above information. Electronic Signature(s) Signed: 07/21/2018 12:23:18 PM By: Worthy Keeler PA-C Signed: 07/21/2018 2:25:55 PM By: [name] Entered By: Worthy Keeler on 07/21/2018 09:52:36 Goering, [name] ([id number]) -------------------------------------------------------------------------------- SuperBill Details Patient Name: [name] Date of Service: 07/21/2018 Medical Record Number: [id number] Patient Account Number: [id number] Date of Birth/Sex: 27-Oct-1925 (85 y.o. F) Treating RN: [name] Primary Care Provider: Thereasa Distance Other Clinician: Referring Provider: Thereasa Distance Treating Provider/Extender: Melburn Hake, Lissie Hinesley Weeks in Treatment: 7 Diagnosis Coding ICD-10 Codes Code Description I73.89 Other specified peripheral vascular diseases L97.818 Non-pressure chronic ulcer of other part of right lower leg with other specified  severity N18.3 Chronic kidney disease, stage 3 (moderate) I48.20 Chronic atrial fibrillation, unspecified Facility Procedures CPT4 Code: [id number] Description: [id number] - [place] W/O ANES NON SELECT Modifier: Quantity: 1 Physician Procedures CPT4 Code Description: [id number] - WC PHYS LEVEL 4 - EST PT ICD-10 Diagnosis Description I73.89 Other specified peripheral vascular diseases L97.818 Non-pressure chronic ulcer of other part of right lower leg wit N18.3 Chronic kidney disease, stage 3  (moderate) I48.20 Chronic atrial fibrillation, unspecified Modifier: h other specified Quantity: 1 severity Electronic Signature(s) Signed: 07/21/2018 12:23:18 PM By: Worthy Keeler PA-C Entered By: Worthy Keeler on 07/21/2018 09:53:32

## 2018-07-24 NOTE — Progress Notes (Signed)
[name], [name] ([id number]) Visit Report for 07/21/2018 Arrival Information Details Patient Name: [name], [name] Date of Service: 07/21/2018 9:15 AM Medical Record Number: [id number] Patient Account Number: [id number] Date of Birth/Sex: 1925/09/11 (85 y.o. F) Treating RN: [name] Primary Care Jaylenn Altier: Thereasa Distance Other Clinician: Referring Spence Soberano: Thereasa Distance Treating Amram Maya/Extender: Melburn Hake, HOYT Weeks in Treatment: 7 Visit Information History Since Last Visit Added or deleted any medications: No Patient Arrived: Wheel Chair Any new allergies or adverse reactions: No Arrival Time: 09:18 Had a fall or experienced change in No Accompanied By: son activities of daily living that may affect Transfer Assistance: None risk of falls: Patient Identification Verified: Yes Signs or symptoms of abuse/neglect since last visito No Secondary Verification Process Completed: Yes Hospitalized since last visit: No Patient Has Alerts: Yes Implantable device outside of the clinic excluding No Patient Alerts: DMII cellular tissue based products placed in the center since last visit: Has Dressing in Place as Prescribed: Yes Pain Present Now: No Electronic Signature(s) Signed: 07/21/2018 12:06:33 PM By: Lorine Bears RCP, RRT, CHT Entered By: Lorine Bears on 07/21/2018 09:19:46 [name], [name] ([id number]) -------------------------------------------------------------------------------- Lower Extremity Assessment Details Patient Name: [name] Date of Service: 07/21/2018 9:15 AM Medical Record Number: [id number] Patient Account Number: [id number] Date of Birth/Sex: [birth year] (85 y.o. F) Treating RN: Harold Barban Primary Care Lizzet Hendley: Thereasa Distance Other Clinician: Referring Madie Cahn: Thereasa Distance Treating Damarcus Reggio/Extender: Melburn Hake, HOYT Weeks in Treatment: 7 Vascular Assessment Pulses: Dorsalis Pedis Palpable:  [Right:Yes] Posterior Tibial Palpable: [Right:Yes] Electronic Signature(s) Signed: 07/24/2018 8:41:29 AM By: Harold Barban Entered By: Harold Barban on 07/21/2018 09:28:34 [name], [name] ([id number]) -------------------------------------------------------------------------------- Multi Wound Chart Details Patient Name: [name] Date of Service: 07/21/2018 9:15 AM Medical Record Number: [id number] Patient Account Number: [id number] Date of Birth/Sex: [birth year] (85 y.o. F) Treating RN: [name] Primary Care Seena Face: Thereasa Distance Other Clinician: Referring Branda Chaudhary: Thereasa Distance Treating Waver Dibiasio/Extender: Melburn Hake, HOYT Weeks in Treatment: 7 Vital Signs Height(in): 63 Pulse(bpm): 98 Weight(lbs): 102 Blood Pressure(mmHg): 152/54 Body Mass Index(BMI): 18 Temperature(F): 97.6 Respiratory Rate 16 (breaths/min): Photos: [N/A:N/A] Wound Location: Right Achilles N/A N/A Wounding Event: Gradually Appeared N/A N/A Primary Etiology: Arterial Insufficiency Ulcer N/A N/A Secondary Etiology: Diabetic Wound/Ulcer of the N/A N/A Lower Extremity Comorbid History: Anemia, Arrhythmia, N/A N/A Congestive Heart Failure, Hypertension, Type II Diabetes, End Stage Renal Disease, Osteoarthritis Date Acquired: 04/01/2018 N/A N/A Weeks of Treatment: 7 N/A N/A Wound Status: Open N/A N/A Pending Amputation on Yes N/A N/A Presentation: Measurements L x W x D 4x2.6x0.3 N/A N/A (cm) Area (cm) : 8.168 N/A N/A Volume (cm) : 2.45 N/A N/A % Reduction in Area: -71.10% N/A N/A % Reduction in Volume: -412.60% N/A N/A Classification: Partial Thickness N/A N/A Exudate Amount: Medium N/A N/A Exudate Type: Serosanguineous N/A N/A Exudate Color: red, brown N/A N/A Wound Margin: Flat and Intact N/A N/A Granulation Amount: None Present (0%) N/A N/A Necrotic Amount: Large (67-100%) N/A N/A Necrotic Tissue: Eschar, Adherent Slough N/A N/A [name], Kathleen J. ([id number]) Exposed  Structures: Fat Layer (Subcutaneous N/A N/A Tissue) Exposed: Yes Fascia: No Tendon: No Muscle: No Joint: No Bone: No Epithelialization: None N/A N/A Periwound Skin Texture: Callus: Yes N/A N/A Excoriation: No Induration: No Crepitus: No Rash: No Scarring: No Periwound Skin Moisture: Dry/Scaly: Yes N/A N/A Maceration: No Periwound Skin Color: Erythema: Yes N/A N/A Atrophie Blanche: No Cyanosis: No Ecchymosis: No Hemosiderin Staining: No Mottled: No Pallor: No Rubor: No Erythema Location: Circumferential N/A N/A Temperature: No  Abnormality N/A N/A Tenderness on Palpation: Yes N/A N/A Treatment Notes Electronic Signature(s) Signed: 07/21/2018 2:25:55 PM By: [name] Entered By: [name] on 07/21/2018 09:43:40 [name], [name] ([id number]) -------------------------------------------------------------------------------- Multi-Disciplinary Care Plan Details Patient Name: [name] Date of Service: 07/21/2018 9:15 AM Medical Record Number: [id number] Patient Account Number: [id number] Date of Birth/Sex: Apr 09, 1926 (85 y.o. F) Treating RN: [name] Primary Care Zubayr Bednarczyk: Thereasa Distance Other Clinician: Referring Lillymae Duet: Thereasa Distance Treating Achsah Mcquade/Extender: Melburn Hake, HOYT Weeks in Treatment: 7 Active Inactive Abuse / Safety / Falls / Self Care Management Nursing Diagnoses: Potential for falls Goals: Patient will not experience any injury related to falls Date Initiated: 06/16/2018 Target Resolution Date: 09/16/2018 Goal Status: Active Interventions: Assess fall risk on admission and as needed Notes: Necrotic Tissue Nursing Diagnoses: Knowledge deficit related to management of necrotic/devitalized tissue Goals: Patient/caregiver will verbalize understanding of reason and process for debridement of necrotic tissue Date Initiated: 06/16/2018 Target Resolution Date: 09/16/2018 Goal Status: Active Interventions: Provide education on necrotic  tissue and debridement process Notes: Nutrition Nursing Diagnoses: Imbalanced nutrition Goals: Patient/caregiver agrees to and verbalizes understanding of need to use nutritional supplements and/or vitamins as prescribed Date Initiated: 06/16/2018 Target Resolution Date: 09/16/2018 Goal Status: Active Interventions: Assess patient nutrition upon admission and as needed per policy [name], [name] ([id number]) Notes: Wound/Skin Impairment Nursing Diagnoses: Impaired tissue integrity Knowledge deficit related to ulceration/compromised skin integrity Goals: Ulcer/skin breakdown will have a volume reduction of 30% by week 4 Date Initiated: 06/02/2018 Target Resolution Date: 07/01/2018 Goal Status: Active Interventions: Assess patient/caregiver ability to obtain necessary supplies Assess patient/caregiver ability to perform ulcer/skin care regimen upon admission and as needed Assess ulceration(s) every visit Notes: Electronic Signature(s) Signed: 07/21/2018 2:25:55 PM By: [name] Entered By: [name] on 07/21/2018 09:43:12 [name], [name] ([id number]) -------------------------------------------------------------------------------- Pain Assessment Details Patient Name: [name] Date of Service: 07/21/2018 9:15 AM Medical Record Number: [id number] Patient Account Number: [id number] Date of Birth/Sex: 09-27-1925 (85 y.o. F) Treating RN: [name] Primary Care Auden Wettstein: Thereasa Distance Other Clinician: Referring Shewanda Sharpe: Thereasa Distance Treating Davonn Flanery/Extender: Melburn Hake, HOYT Weeks in Treatment: 7 Active Problems Location of Pain Severity and Description of Pain Patient Has Paino No Site Locations Pain Management and Medication Current Pain Management: Electronic Signature(s) Signed: 07/21/2018 12:06:33 PM By: Paulla Fore, RRT, CHT Signed: 07/21/2018 2:25:55 PM By: [name] Entered By: Lorine Bears on 07/21/2018  09:19:53 [name], [name] ([id number]) -------------------------------------------------------------------------------- Patient/Caregiver Education Details Patient Name: [name] Date of Service: 07/21/2018 9:15 AM Medical Record Number: [id number] Patient Account Number: [id number] Date of Birth/Gender: 08-13-1925 (85 y.o. F) Treating RN: [name] Primary Care Physician: Thereasa Distance Other Clinician: Referring Physician: Thereasa Distance Treating Physician/Extender: Sharalyn Ink in Treatment: 7 Education Assessment Education Provided To: Patient and Caregiver Education Topics Provided Wound/Skin Impairment: Handouts: Other: wound care as ordered Methods: Demonstration, Explain/Verbal Responses: State content correctly Electronic Signature(s) Signed: 07/21/2018 2:25:55 PM By: [name] Entered By: [name] on 07/21/2018 09:46:20 [name], [name] ([id number]) -------------------------------------------------------------------------------- Wound Assessment Details Patient Name: [name] Date of Service: 07/21/2018 9:15 AM Medical Record Number: [id number] Patient Account Number: [id number] Date of Birth/Sex: 19-Apr-1925 (85 y.o. 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 Wounding Event: Gradually Appeared Etiology:  Date Acquired: 04/01/2018 Wound Open Weeks Of Treatment: 7 Status: Clustered Wound: No Comorbid Anemia, Arrhythmia, Congestive Heart Failure, Pending Amputation On Presentation History: Hypertension, Type II Diabetes, End Stage Renal Disease, Osteoarthritis Photos Wound Measurements Length: (cm) 4 % Reduction i Width:  (cm) 2.6 % Reduction i Depth: (cm) 0.3 Epithelializa Area: (cm) 8.168 Tunneling: Volume: (cm) 2.45 Undermining: n Area: -71.1% n Volume: -412.6% tion: None No No Wound Description Classification: Partial Thickness Foul Odor Af Wound Margin: Flat and Intact Slough/Fibri Exudate Amount: Medium Exudate Type: Serosanguineous Exudate Color: red, brown ter Cleansing: No no Yes Wound Bed Granulation Amount: None Present (0%) Exposed Structure Necrotic Amount: Large (67-100%) Fascia Exposed: No Necrotic Quality: Eschar, Adherent Slough Fat Layer (Subcutaneous Tissue) Exposed: Yes Tendon Exposed: No Muscle Exposed: No Joint Exposed: No Bone Exposed: No [name], Kathleen J. ([id number]) Periwound Skin Texture Texture Color No Abnormalities Noted: No No Abnormalities Noted: No Callus: Yes Atrophie Blanche: No Crepitus: No Cyanosis: No Excoriation: No Ecchymosis: No Induration: No Erythema: Yes Rash: No Erythema Location: Circumferential Scarring: No Hemosiderin Staining: No Mottled: No Moisture Pallor: No No Abnormalities Noted: No Rubor: No Dry / Scaly: Yes Maceration: No Temperature / Pain Temperature: No Abnormality Tenderness on Palpation: Yes Electronic Signature(s) Signed: 07/24/2018 8:41:29 AM By: Harold Barban Entered By: Harold Barban on 07/21/2018 09:28:21 [name], [name] ([id number]) -------------------------------------------------------------------------------- Vitals Details Patient Name: [name] Date of Service: 07/21/2018 9:15 AM Medical Record Number: [id number] Patient Account Number: [id number] Date of Birth/Sex: Jan 26, 1926 (85 y.o. F) Treating RN: [name] Primary Care Dywane Peruski: Thereasa Distance Other Clinician: Referring Norval Slaven: Thereasa Distance Treating Amillya Chavira/Extender: Melburn Hake, HOYT Weeks in Treatment: 7 Vital Signs Time Taken: 09:15 Temperature (F): 97.6 Height (in): 63 Pulse (bpm): 63 Weight (lbs):  102 Respiratory Rate (breaths/min): 16 Body Mass Index (BMI): 18.1 Blood Pressure (mmHg): 152/54 Reference Range: 80 - 120 mg / dl Electronic Signature(s) Signed: 07/21/2018 12:06:33 PM By: Lorine Bears RCP, RRT, CHT Entered By: Lorine Bears on 07/21/2018 09:20:23

## 2018-07-28 ENCOUNTER — Other Ambulatory Visit: Payer: Self-pay

## 2018-07-28 ENCOUNTER — Encounter: Payer: Medicare Other | Admitting: Physician Assistant

## 2018-07-28 DIAGNOSIS — E11622 Type 2 diabetes mellitus with other skin ulcer: Secondary | ICD-10-CM | POA: Diagnosis not present

## 2018-07-28 NOTE — Progress Notes (Signed)
[name], [name] ([id number]) Visit Report for 07/28/2018 Chief Complaint Document Details Patient Name: [name], [name] Date of Service: 07/28/2018 9:30 AM Medical Record Number: [id number] Patient Account Number: [id number] Date of Birth/Sex: Sep 22, 1925 (85 y.o. F) Treating RN: [name] Primary Care Provider: Thereasa Distance Other Clinician: Referring Provider: Thereasa Distance Treating Provider/Extender: Melburn Hake, Shaniah Baltes Weeks in Treatment: 8 Information Obtained from: Patient Chief Complaint Right Lower leg Achilles ulcer Electronic Signature(s) Signed: 07/28/2018 4:17:19 PM By: Worthy Keeler PA-C Entered By: Worthy Keeler on 07/28/2018 09:28:37 [name], [name] ([id number]) -------------------------------------------------------------------------------- Debridement Details Patient Name: [name] Date of Service: 07/28/2018 9:30 AM Medical Record Number: [id number] Patient Account Number: [id number] Date of Birth/Sex: 09-Jul-1925 (85 y.o. F) Treating RN: [name] Primary Care Provider: Thereasa Distance Other Clinician: Referring Provider: Thereasa Distance Treating Provider/Extender: Melburn Hake, Rakwon Letourneau Weeks in Treatment: 8 Debridement Performed for Wound #1 Right Achilles Assessment: Performed By: Physician STONE III, Terrian Ridlon E., PA-C Debridement Type: Chemical/Enzymatic/Mechanical Agent Used: Santyl Severity of Tissue Pre Necrosis of muscle Debridement: Level of Consciousness (Pre- Awake and Alert procedure): Pre-procedure Verification/Time Yes - 09:42 Out Taken: Start Time: 09:42 Pain Control: Lidocaine 4% Topical Solution Instrument: Other : tongue blade Bleeding: None End Time: 09:43 Procedural Pain: 0 Post Procedural Pain: 0 Response to Treatment: Procedure was tolerated well Level of Consciousness Awake and Alert (Post-procedure): Post Debridement Measurements of Total Wound Length: (cm) 4 Width: (cm) 2.4 Depth: (cm) 0.3 Volume: (cm)  2.262 Character of Wound/Ulcer Post Debridement: Improved Severity of Tissue Post Debridement: Necrosis of muscle Post Procedure Diagnosis Same as Pre-procedure Electronic Signature(s) Signed: 07/28/2018 4:04:21 PM By: [name] Signed: 07/28/2018 4:17:19 PM By: Worthy Keeler PA-C Entered By: [name] on 07/28/2018 09:43:29 [name], [name] ([id number]) -------------------------------------------------------------------------------- HPI Details Patient Name: [name] Date of Service: 07/28/2018 9:30 AM Medical Record Number: [id number] Patient Account Number: [id number] Date of Birth/Sex: 1925/10/11 (85 y.o. F) Treating RN: [name] Primary Care Provider: Thereasa Distance Other Clinician: Referring Provider: Thereasa Distance Treating Provider/Extender: Melburn Hake, Adisynn Suleiman Weeks in Treatment: 8 History of Present Illness HPI Description: 06/02/18 patient presents today for initial evaluation our clinic concerning the ulcers that she has on the right Achilles location. 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 She was subsequently supposed to have had a scheduled likely stent placement in December 2019 unfortunately she had a fall with a sub dural hematoma which subsequently had to be evacuated partially at least. For that reason she has never gotten back in with Dr. Delana Meyer to have the vascular procedure. She does not have any appointment scheduled with him at this time. The patient does have a history of chronic kidney disease stage III, congestive heart failure, trophy relation, and at this point home health who has been coming out has been using [facility]. This is been since she's been home towards the end of December as best we can tell. She does have a hemoglobin A1c of 7.0 which was obtained on 11/18/17. Currently the patient's right lower extremity that showed diminished blood flow according to her arterial  study which is dated 02/27/18. At that point her right ABI was 0.89 with a TBI of 0.36 and her left ABI was 1.13 with a TBI of 0.50. Her prior study which was in May on the 13th 2019 showed that she had a right ABI of one and a right TBI of 0.51 with a left ABI of 0.72  and a TBI of 0.50. Obviously since May till now her right leg has diminished as far as the blood flows concerned patient's son who was with her today states that Dr. 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 I do believe that for limb salvage this patient needs to have the above stated procedure with Dr. Delana Meyer as soon as possible. 06/09/18 on evaluation today patient actually appears to be doing about the same in regard to her Achilles ulcer. This actually does have tended exposed however the tenant is remaining moist which is good news that's exactly what we want to see. Fortunately there's no signs of infection she does have an appointment with Dr. Delana Meyer this coming Tuesday, June 13, 2018. 06/16/18 on evaluation today patient actually appears to be doing rather well in regard to her right lower extremity at this point. The Achilles ulcer actually showed signs of being a little bit better as far as the overall appearance of the wound was concerned today. Fortunately there did not appear to be the evidence of infection at this time which is good news. With that being said she did have her angiogram where she did actually have a significant blockage of the 90% into her lower extremity. Fortunately between the balloon angioplasty followed by stating this was able to be improved to less than 5% residual stenosis according to Dr. Nino Parsley note that I read. She still may have some limitation into her foot in particular as far as the runoff is concerned but nonetheless in general appears to be doing much better which is great news. Fortunately there is no sign of infection at this time. 06/23/18 on evaluation  today patient's wound bed actually appears to be doing about the same although the central portion of the tendon actually appears to be somewhat dry this is definitely not good news. We want to try to keep this moist while allowing the edges of the wound hopefully start to cover the tendon that is the goal. Nonetheless I think that we may need to switch things up a little bit to try to improve the moisture trapping at this point. She does have better blood flow which is great we have applied for EpiFix although unfortunately as of right now this shows to be not covered. I think that we need to try to see about getting this approved as with the tenant expose me to do everything we can to try to get this covering of the skin as soon as possible. Again I'm not even sure that she'd be a candidate for plastic surgery intervention. 06/30/18 on evaluation today patient actually appears to be doing about the same in regard to her wound on the right Achilles region. The Achilles tendon is still exposed there does not appear to be any tissue growth around the edge while the tendon itself does not appear to be quite as dry as it was previous there's definitely necrosis of the tenant beginning to be noted unfortunately. For that reason I think that we really need to make an appointment for her to see a specialist in order to discuss the options with her in regard to this one. Fortunately there does not appear to be any signs of systemic infection or even local infection for that matter. 07/07/18 on evaluation today patient appears to be doing about the same in regard to her Achilles region also. There does not appear to be any signs of active infection at this time  which is good news. With that being said her Achilles tendon is very dry and appears to be more so due to the fact that apparently the last dressing that was put on was used hydrogel with a Dry gauze of the top not what had been previously recommended.  For that reason again the tendon does appear to be quite a bit Mckinnon, Larue J. ([id number]) more dry than what it was previous unfortunately. They still have not heard from orthopedics is for the referral for her Achilles region. We had made this referral last week on Tuesday the sign still hadn't heard anything and the office told him that they did not have a referral from [place] we therefore reset the referral then he still has not heard anything. 07/14/18 on evaluation today patient appears to be doing about the same in regard to her right Achilles ulcer. She has been tolerating the dressing changes without complication. With that being said nothing seems to really improve significantly in my pinion. She did see the orthopedic doctor earlier this week they recommended a referral to Dr. Vickki Muff her podiatrist for further evaluation as well as a potential referral to plastic surgery although that is not scheduled as of yet. The patient is scheduled to see her podiatrist on Monday. There's no signs of active infection at this time. 07/21/18 on evaluation today patient was actually seen on 07/17/18 by Dr. Vickki Muff who is a local podiatrist. Subsequently he did sharply debride in the office the wound to some degree to try to clear off the eschar and see how much of the tenant underlying was actually damaged. He however felt that with the patient really needed was more significant debridement of the Achilles tendon to clear this away and that a Wound VAC following. However he considers this a semi-elective surgery which subsequently the hospitals not allowing at this point. Nonetheless the patient son was present during the evaluation that she had there with the podiatrist and did we count that exactly what I was saying was exactly what he remembered as well. No fevers, chills, nausea, or vomiting noted at this time. 07/28/18 on evaluation today patient appears to be doing a little better in regard to the necrotic  tendon loosen up on the surface of her wound. She's been tolerating the dressing changes without complication. That does not appear to be any signs of active infection at this time which is good news. I'll be said that something we're trying to avoid. She does see Dr. Vickki Muff on Monday and just a couple of days. Electronic Signature(s) Signed: 07/28/2018 4:17:19 PM By: Worthy Keeler PA-C Entered By: Worthy Keeler on 07/28/2018 09:54:51 [name], [name] ([id number]) -------------------------------------------------------------------------------- Physical Exam Details Patient Name: [name] Date of Service: 07/28/2018 9:30 AM Medical Record Number: [id number] Patient Account Number: [id number] Date of Birth/Sex: 1926-04-01 (85 y.o. F) Treating RN: [name] Primary Care Provider: Thereasa Distance Other Clinician: Referring Provider: Thereasa Distance Treating Provider/Extender: Melburn Hake, Caymen Dubray Weeks in Treatment: 8 Constitutional Well-nourished and well-hydrated in no acute distress. Respiratory normal breathing without difficulty. clear to auscultation bilaterally. Cardiovascular regular rate and rhythm with normal S1, S2. Psychiatric this patient is able to make decisions and demonstrates good insight into disease process. Alert and Oriented x 3. pleasant and cooperative. Notes Patient's wound bed currently showed signs of necrotic tendon on the surface of the wound. Dr. Vickki Muff at the last time he saw her had debride away some of this and it seemed to  be doing a little bit better I do believe that the Santyl has been beneficial in helping to loosen up some of the necrotic tendon as well which is good news. Definitely this is not drying out as much as it has in the past. Fortunately there's no signs of active infection currently we're still waiting to hear whether or not surgery is something that could be proceeded with as far as she is concerned. This is something that Dr.  Vickki Muff mentioned as well in his note from 07/17/18. However due to this being in his words a semi-elective nonemergent surgery apparently the patient does not qualify for surgery at this time under the hospital guidelines due to the current Covid-19 Virus pandemic. Electronic Signature(s) Signed: 07/28/2018 4:17:19 PM By: Worthy Keeler PA-C Entered By: Worthy Keeler on 07/28/2018 09:57:35 [name], [name] ([id number]) -------------------------------------------------------------------------------- Physician Orders Details Patient Name: [name] Date of Service: 07/28/2018 9:30 AM Medical Record Number: [id number] Patient Account Number: [id number] Date of Birth/Sex: May 21, 1925 (85 y.o. F) Treating RN: [name] Primary Care Provider: Thereasa Distance Other Clinician: Referring Provider: Thereasa Distance Treating Provider/Extender: Melburn Hake, Everlyn Farabaugh Weeks in Treatment: 8 Verbal / Phone Orders: No Diagnosis Coding ICD-10 Coding Code Description I73.89 Other specified peripheral vascular diseases L97.818 Non-pressure chronic ulcer of other part of right lower leg with other specified severity N18.3 Chronic kidney disease, stage 3 (moderate) I48.20 Chronic atrial fibrillation, unspecified Wound Cleansing Wound #1 Right Achilles o Clean wound with Normal Saline. Primary Wound Dressing Wound #1 Right Achilles o Santyl Ointment o Xeroform - over santyl to trap moisture on tendon Secondary Dressing Wound #1 Right Achilles o ABD and Kerlix/Conform Dressing Change Frequency Wound #1 Right Achilles o Change dressing every day. Follow-up Appointments Wound #1 Right Achilles o Return Appointment in 1 week. Home Health Wound #1 Right Achilles o Continue Home Health Visits o Home Health Nurse may visit PRN to address patientos wound care needs. o FACE TO FACE ENCOUNTER: MEDICARE and MEDICAID PATIENTS: I certify that this patient is under my care and that I had a  face-to-face encounter that meets the physician face-to-face encounter requirements with this patient on this date. The encounter with the patient was in whole or in part for the following MEDICAL CONDITION: (primary reason for [place]) MEDICAL NECESSITY: I certify, that based on my findings, NURSING services are a medically necessary home health service. HOME BOUND STATUS: I certify that my clinical findings support that this patient is homebound (i.e., Due to illness or injury, pt requires aid of supportive devices such as crutches, cane, wheelchairs, walkers, the use of special transportation or the assistance of another person to leave their place of residence. There is a normal inability to leave the home and doing so requires considerable and taxing effort. Other absences are for medical reasons / religious services and are infrequent or of short duration when for other reasons). KAIANNA, DOLEZAL ([id number]) o If current dressing causes regression in wound condition, may D/C ordered dressing product/s and apply Normal Saline Moist Dressing daily until next [place] / Other MD appointment. [place] of regression in wound condition at [phone]. o Please direct any NON-WOUND related issues/requests for orders to patient's Primary Care Physician Medications-please add to medication list. Wound #1 Right Achilles o Santyl Enzymatic Ointment Electronic Signature(s) Signed: 07/28/2018 4:04:21 PM By: [name] Signed: 07/28/2018 4:17:19 PM By: Worthy Keeler PA-C Entered By: [name] on 07/28/2018 09:44:01 [name], Kathleen J. (  [id number]) -------------------------------------------------------------------------------- Problem List Details Patient Name: [name], [name] Date of Service: 07/28/2018 9:30 AM Medical Record Number: [id number] Patient Account Number: [id number] Date of Birth/Sex: [birth year] (85 y.o. F) Treating RN: [name] Primary Care Provider: Thereasa Distance Other Clinician: Referring Provider: Thereasa Distance Treating Provider/Extender: Melburn Hake, Isham Smitherman Weeks in Treatment: 8 Active Problems ICD-10 Evaluated Encounter Code Description Active Date Today Diagnosis I73.89 Other specified peripheral vascular diseases 06/02/2018 No Yes L97.818 Non-pressure chronic ulcer of other part of right lower leg 06/02/2018 No Yes with other specified severity N18.3 Chronic kidney disease, stage 3 (moderate) 06/02/2018 No Yes I48.20 Chronic atrial fibrillation, unspecified 06/02/2018 No Yes Inactive Problems Resolved Problems Electronic Signature(s) Signed: 07/28/2018 4:17:19 PM By: Worthy Keeler PA-C Entered By: Worthy Keeler on 07/28/2018 09:28:33 [name], [name] ([id number]) -------------------------------------------------------------------------------- Progress Note Details Patient Name: [name] Date of Service: 07/28/2018 9:30 AM Medical Record Number: [id number] Patient Account Number: [id number] Date of Birth/Sex: 01-12-1926 (85 y.o. F) Treating RN: [name] Primary Care Provider: Thereasa Distance Other Clinician: Referring Provider: Thereasa Distance Treating Provider/Extender: Melburn Hake, Mattson Dayal Weeks in Treatment: 8 Subjective Chief Complaint Information obtained from Patient Right Lower leg Achilles ulcer History of Present Illness (HPI) 06/02/18 patient presents today for initial evaluation our clinic concerning the ulcers that she has on the right Achilles location. 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 She was subsequently supposed to have had a scheduled likely stent placement in December 2019 unfortunately she had a fall with a sub dural hematoma which subsequently had to be evacuated partially at least. For that reason she has never gotten back in with Dr. Delana Meyer to have the vascular procedure. She does not have  any appointment scheduled with him at this time. The patient does have a history of chronic kidney disease stage III, congestive heart failure, trophy relation, and at this point home health who has been coming out has been using [facility]. This is been since she's been home towards the end of December as best we can tell. She does have a hemoglobin A1c of 7.0 which was obtained on 11/18/17. Currently the patient's right lower extremity that showed diminished blood flow according to her arterial study which is dated 02/27/18. At that point her right ABI was 0.89 with a TBI of 0.36 and her left ABI was 1.13 with a TBI of 0.50. Her prior study which was in May on the 13th 2019 showed that she had a right ABI of one and a right TBI of 0.51 with a left ABI of 0.72 and a TBI of 0.50. Obviously since May till now her right leg has diminished as far as the blood flows concerned patient's son who was with her today states that Dr. 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 I do believe that for limb salvage this patient needs to have the above stated procedure with Dr. Delana Meyer as soon as possible. 06/09/18 on evaluation today patient actually appears to be doing about the same in regard to her Achilles ulcer. This actually does have tended exposed however the tenant is remaining moist which is good news that's exactly what we want to see. Fortunately there's no signs of infection she does have an appointment with Dr. Delana Meyer this coming Tuesday, June 13, 2018. 06/16/18 on evaluation today patient actually appears to be doing rather well in regard to her right lower extremity at this point.  The Achilles ulcer actually showed signs of being a little bit better as far as the overall appearance of the wound was concerned today. Fortunately there did not appear to be the evidence of infection at this time which is good news. With that being said she did have her angiogram where she did  actually have a significant blockage of the 90% into her lower extremity. Fortunately between the balloon angioplasty followed by stating this was able to be improved to less than 5% residual stenosis according to Dr. Nino Parsley note that I read. She still may have some limitation into her foot in particular as far as the runoff is concerned but nonetheless in general appears to be doing much better which is great news. Fortunately there is no sign of infection at this time. 06/23/18 on evaluation today patient's wound bed actually appears to be doing about the same although the central portion of the tendon actually appears to be somewhat dry this is definitely not good news. We want to try to keep this moist while allowing the edges of the wound hopefully start to cover the tendon that is the goal. Nonetheless I think that we may need to switch things up a little bit to try to improve the moisture trapping at this point. She does have better blood flow which is great we have applied for EpiFix although unfortunately as of right now this shows to be not covered. I think that we need to try to see about getting this approved as with the tenant expose me to do everything we can to try to get this covering of the skin as soon as possible. Again I'm not even sure that she'd be a candidate for plastic surgery intervention. 06/30/18 on evaluation today patient actually appears to be doing about the same in regard to her wound on the right Achilles region. The Achilles tendon is still exposed there does not appear to be any tissue growth around the edge while the tendon itself does not appear to be quite as dry as it was previous there's definitely necrosis of the tenant beginning to be noted unfortunately. For that reason I think that we really need to make an appointment for her to see a specialist in order to discuss the options with her in regard to this one. Fortunately there does not appear to be any  signs of systemic infection or even local Perleberg, Kathleen J. ([id number]) infection for that matter. 07/07/18 on evaluation today patient appears to be doing about the same in regard to her Achilles region also. There does not appear to be any signs of active infection at this time which is good news. With that being said her Achilles tendon is very dry and appears to be more so due to the fact that apparently the last dressing that was put on was used hydrogel with a Dry gauze of the top not what had been previously recommended. For that reason again the tendon does appear to be quite a bit more dry than what it was previous unfortunately. They still have not heard from orthopedics is for the referral for her Achilles region. We had made this referral last week on Tuesday the sign still hadn't heard anything and the office told him that they did not have a referral from [place] we therefore reset the referral then he still has not heard anything. 07/14/18 on evaluation today patient appears to be doing about the same in regard to her right Achilles ulcer. She  has been tolerating the dressing changes without complication. With that being said nothing seems to really improve significantly in my pinion. She did see the orthopedic doctor earlier this week they recommended a referral to Dr. Vickki Muff her podiatrist for further evaluation as well as a potential referral to plastic surgery although that is not scheduled as of yet. The patient is scheduled to see her podiatrist on Monday. There's no signs of active infection at this time. 07/21/18 on evaluation today patient was actually seen on 07/17/18 by Dr. Vickki Muff who is a local podiatrist. Subsequently he did sharply debride in the office the wound to some degree to try to clear off the eschar and see how much of the tenant underlying was actually damaged. He however felt that with the patient really needed was more significant debridement of the Achilles tendon to  clear this away and that a Wound VAC following. However he considers this a semi-elective surgery which subsequently the hospitals not allowing at this point. Nonetheless the patient son was present during the evaluation that she had there with the podiatrist and did we count that exactly what I was saying was exactly what he remembered as well. No fevers, chills, nausea, or vomiting noted at this time. 07/28/18 on evaluation today patient appears to be doing a little better in regard to the necrotic tendon loosen up on the surface of her wound. She's been tolerating the dressing changes without complication. That does not appear to be any signs of active infection at this time which is good news. I'll be said that something we're trying to avoid. She does see Dr. Vickki Muff on Monday and just a couple of days. Patient History Information obtained from Patient. Family History Cancer - Siblings, Heart Disease - Mother,Siblings,Father, Hypertension - Mother,Father,Siblings, Stroke - Father, No family history of Diabetes, Hereditary Spherocytosis, Kidney Disease, Lung Disease, Seizures, Thyroid Problems, Tuberculosis. Social History Never smoker, Marital Status - Married, Alcohol Use - Never, Drug Use - No History, Caffeine Use - Daily. Medical History Eyes Denies history of Cataracts, Glaucoma Ear/Nose/Mouth/Throat Denies history of Chronic sinus problems/congestion, Middle ear problems Hematologic/Lymphatic Patient has history of Anemia Denies history of Hemophilia, Human Immunodeficiency Virus, Lymphedema, Sickle Cell Disease Respiratory Denies history of Aspiration, Asthma, Chronic Obstructive Pulmonary Disease (COPD), Pneumothorax, Sleep Apnea, Tuberculosis Cardiovascular Patient has history of Arrhythmia - a fib, Congestive Heart Failure, Hypertension Denies history of Angina, Coronary Artery Disease, Deep Vein Thrombosis, Hypotension, Myocardial Infarction, Peripheral Arterial Disease,  Peripheral Venous Disease, Phlebitis, Vasculitis Gastrointestinal Denies history of Cirrhosis , Colitis, Crohn s, Hepatitis A, Hepatitis B, Hepatitis C Endocrine Patient has history of Type II Diabetes [name], Kathleen J. ([id number]) Denies history of Type I Diabetes Genitourinary Patient has history of End Stage Renal Disease - CKD stage 3 Immunological Denies history of Lupus Erythematosus, Raynaud s, Scleroderma Integumentary (Skin) Denies history of History of Burn, History of pressure wounds Musculoskeletal Patient has history of Osteoarthritis Denies history of Gout, Rheumatoid Arthritis, Osteomyelitis Neurologic Denies history of Dementia, Neuropathy, Quadriplegia, Paraplegia, Seizure Disorder Oncologic Denies history of Received Chemotherapy, Received Radiation Psychiatric Denies history of Anorexia/bulimia, Confinement Anxiety Medical And Surgical History Notes Cardiovascular HLD, mitral insufficiency Musculoskeletal 4 broken ribs Review of Systems (ROS) Constitutional Symptoms (General Health) Denies complaints or symptoms of Fatigue, Fever, Chills, Marked Weight Change. Respiratory Denies complaints or symptoms of Chronic or frequent coughs, Shortness of Breath. Cardiovascular Denies complaints or symptoms of Chest pain, LE edema. Psychiatric Denies complaints or symptoms of Anxiety, Claustrophobia. Objective Constitutional  Well-nourished and well-hydrated in no acute distress. Vitals Time Taken: 9:21 AM, Height: 63 in, Weight: 102 lbs, BMI: 18.1, Temperature: 97.7 F, Pulse: 70 bpm, Respiratory Rate: 16 breaths/min, Blood Pressure: 131/45 mmHg. Respiratory normal breathing without difficulty. clear to auscultation bilaterally. Cardiovascular regular rate and rhythm with normal S1, S2. Psychiatric this patient is able to make decisions and demonstrates good insight into disease process. Alert and Oriented x 3. pleasant and cooperative. IMAJEAN, MCDERMID  ([id number]) General Notes: Patient's wound bed currently showed signs of necrotic tendon on the surface of the wound. Dr. Vickki Muff at the last time he saw her had debride away some of this and it seemed to be doing a little bit better I do believe that the Santyl has been beneficial in helping to loosen up some of the necrotic tendon as well which is good news. Definitely this is not drying out as much as it has in the past. Fortunately there's no signs of active infection currently we're still waiting to hear whether or not surgery is something that could be proceeded with as far as she is concerned. This is something that Dr. Vickki Muff mentioned as well in his note from 07/17/18. However due to this being in his words a semi-elective nonemergent surgery apparently the patient does not qualify for surgery at this time under the hospital guidelines due to the current Covid-19 Virus pandemic. Integumentary (Hair, Skin) Wound #1 status is Open. Original cause of wound was Gradually Appeared. The wound is located on the Right Achilles. The wound measures 4cm length x 2.4cm width x 0.3cm depth; 7.54cm^2 area and 2.262cm^3 volume. There is tendon and Fat Layer (Subcutaneous Tissue) Exposed exposed. There is no tunneling or undermining noted. There is a medium amount of serous drainage noted. The wound margin is flat and intact. There is no granulation within the wound bed. There is a large (67-100%) amount of necrotic tissue within the wound bed including Eschar and Adherent Slough. The periwound skin appearance exhibited: Rubor. The periwound skin appearance did not exhibit: Callus, Crepitus, Excoriation, Induration, Rash, Scarring, Dry/Scaly, Maceration, Atrophie Blanche, Cyanosis, Ecchymosis, Hemosiderin Staining, Mottled, Pallor, Erythema. Periwound temperature was noted as No Abnormality. The periwound has tenderness on palpation. 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 There was a Chemical/Enzymatic/Mechanical debridement performed by STONE III, Json Koelzer E., PA-C. With the following instrument(s): tongue blade after achieving pain control using Lidocaine 4% Topical Solution. Agent used was [employer]. A time out was conducted at 09:42, prior to the start of the procedure. There was no bleeding. The procedure was tolerated well with a pain level of 0 throughout and a pain level of 0 following the procedure. Post Debridement Measurements: 4cm length x 2.4cm width x 0.3cm depth; 2.262cm^3 volume. Character of Wound/Ulcer Post Debridement is improved. Severity of Tissue Post Debridement is: Necrosis of muscle. Post procedure Diagnosis Wound #1: Same as Pre-Procedure Plan Pitner, Zakyla J. ([id number]) Wound Cleansing: Wound #1 Right Achilles: Clean wound with Normal Saline. Primary Wound Dressing: Wound #1 Right Achilles: Santyl Ointment Xeroform - over santyl to trap moisture on tendon Secondary Dressing: Wound #1 Right Achilles: ABD and Kerlix/Conform Dressing Change Frequency: Wound #1 Right Achilles: Change dressing  every day. Follow-up Appointments: Wound #1 Right Achilles: Return Appointment in 1 week. Home Health: Wound #1 Right Achilles: [facility] Nurse may visit PRN to address patient s wound care needs. FACE TO FACE ENCOUNTER: MEDICARE and MEDICAID PATIENTS: I certify that this patient is under my care and that I had a face-to-face encounter that meets the physician face-to-face encounter requirements with this patient on this date. The encounter with the patient  was in whole or in part for the following MEDICAL CONDITION: (primary reason for [place]) MEDICAL NECESSITY: I certify, that based on my findings, NURSING services are a medically necessary home health service. HOME BOUND STATUS: I certify that my clinical findings support that this patient is homebound (i.e., Due to illness or injury, pt requires aid of supportive devices such as crutches, cane, wheelchairs, walkers, the use of special transportation or the assistance of another person to leave their place of residence. There is a normal inability to leave the home and doing so requires considerable and taxing effort. Other absences are for medical reasons / religious services and are infrequent or of short duration when for other reasons). If current dressing causes regression in wound condition, may D/C ordered dressing product/s and apply Normal Saline Moist Dressing daily until next [place] / Other MD appointment. [place] of regression in wound condition at [phone]. Please direct any NON-WOUND related issues/requests for orders to patient's Primary Care Physician Medications-please add to medication list.: Wound #1 Right Achilles: Santyl Enzymatic Ointment My suggestion at this point is gonna be that we go ahead and continue with the above wound care measures including the Santyl for the next week. She's in agreement with plan. We will see what Dr. Vickki Muff can do as far as getting more of the necrotic tendon trimmed away when he sees her on Monday hopefully in the office. In the meantime with plan to see her in the week to recheck and see were things stand just to keep an eye on this obviously my goal is to try to prevent any infection until something more definitive can be done for her. In speaking with her son he states that she's pretty much given up hope that this is every gonna get better. I think that she could get better but obviously where  having some issues in general with getting things scheduled due to the current healthcare landscape with the Covid-19 Virus pandemic. We will see were things stand next week. Please see above for specific wound care orders. We will see patient for re-evaluation in 1 week(s) here in the clinic. If anything worsens or changes patient will contact our office for additional recommendations. Electronic Signature(s) Signed: 07/28/2018 4:17:19 PM By: Worthy Keeler PA-C Entered By: Worthy Keeler on 07/28/2018 09:59:08 [name], [name] ([id number]) [name], [name] ([id number]) -------------------------------------------------------------------------------- ROS/PFSH Details Patient Name: [name] Date of Service: 07/28/2018 9:30 AM Medical Record Number: [id number] Patient Account Number: [id number] Date of Birth/Sex: 09-23-1925 (85 y.o. F) Treating RN: [name] Primary Care Provider: Thereasa Distance Other Clinician: Referring Provider: Thereasa Distance Treating Provider/Extender: Melburn Hake, Nolan Lasser Weeks in Treatment: 8 Information Obtained From Patient Constitutional Symptoms (General Health) Complaints and Symptoms: Negative for: Fatigue; Fever; Chills; Marked Weight Change Respiratory Complaints and Symptoms: Negative for: Chronic or frequent coughs; Shortness of Breath Medical History: Negative for: Aspiration; Asthma; Chronic Obstructive Pulmonary Disease (COPD); Pneumothorax; Sleep Apnea; Tuberculosis Cardiovascular Complaints and Symptoms: Negative for: Chest pain; LE  edema Medical History: Positive for: Arrhythmia - a fib; Congestive Heart Failure; Hypertension Negative for: Angina; Coronary Artery Disease; Deep Vein Thrombosis; Hypotension; Myocardial Infarction; Peripheral Arterial Disease; Peripheral Venous Disease; Phlebitis; Vasculitis Past Medical History Notes: HLD, mitral insufficiency Psychiatric Complaints and Symptoms: Negative for: Anxiety;  Claustrophobia Medical History: Negative for: Anorexia/bulimia; Confinement Anxiety Eyes Medical History: Negative for: Cataracts; Glaucoma Ear/Nose/Mouth/Throat Medical History: Negative for: Chronic sinus problems/congestion; Middle ear problems Hematologic/Lymphatic Medical History: Positive for: Anemia [name], [name] J. ([id number]) Negative for: Hemophilia; Human Immunodeficiency Virus; Lymphedema; Sickle Cell Disease Gastrointestinal Medical History: Negative for: Cirrhosis ; Colitis; Crohnos; Hepatitis A; Hepatitis B; Hepatitis C Endocrine Medical History: Positive for: Type II Diabetes Negative for: Type I Diabetes Treated with: Insulin Blood sugar tested every day: Yes Tested : Genitourinary Medical History: Positive for: End Stage Renal Disease - CKD stage 3 Immunological Medical History: Negative for: Lupus Erythematosus; Raynaudos; Scleroderma Integumentary (Skin) Medical History: Negative for: History of Burn; History of pressure wounds Musculoskeletal Medical History: Positive for: Osteoarthritis Negative for: Gout; Rheumatoid Arthritis; Osteomyelitis Past Medical History Notes: 4 broken ribs Neurologic Medical History: Negative for: Dementia; Neuropathy; Quadriplegia; Paraplegia; Seizure Disorder Oncologic Medical History: Negative for: Received Chemotherapy; Received Radiation Immunizations Pneumococcal Vaccine: Received Pneumococcal Vaccination: Yes Immunization Notes: up to date Implantable Devices No devices added Family and Social History Kuennen, YONNA ALWIN. ([id number]) Cancer: Yes - Siblings; Diabetes: No; Heart Disease: Yes - Mother,Siblings,Father; Hereditary Spherocytosis: No; Hypertension: Yes - Mother,Father,Siblings; Kidney Disease: No; Lung Disease: No; Seizures: No; Stroke: Yes - Father; Thyroid Problems: No; Tuberculosis: No; Never smoker; Marital Status - Married; Alcohol Use: Never; Drug Use: No History; Caffeine Use: Daily; Financial  Concerns: No; Food, Clothing or Shelter Needs: No; Support System Lacking: No; Transportation Concerns: No Physician Affirmation I have reviewed and agree with the above information. Electronic Signature(s) Signed: 07/28/2018 4:04:21 PM By: [name] Signed: 07/28/2018 4:17:19 PM By: Worthy Keeler PA-C Entered By: Worthy Keeler on 07/28/2018 09:55:17 Mcmackin, [name] ([id number]) -------------------------------------------------------------------------------- SuperBill Details Patient Name: [name] Date of Service: 07/28/2018 Medical Record Number: [id number] Patient Account Number: [id number] Date of Birth/Sex: [birth year] (85 y.o. F) Treating RN: [name] Primary Care Provider: Thereasa Distance Other Clinician: Referring Provider: Thereasa Distance Treating Provider/Extender: Melburn Hake, Johnney Scarlata Weeks in Treatment: 8 Diagnosis Coding ICD-10 Codes Code Description I73.89 Other specified peripheral vascular diseases L97.818 Non-pressure chronic ulcer of other part of right lower leg with other specified severity N18.3 Chronic kidney disease, stage 3 (moderate) I48.20 Chronic atrial fibrillation, unspecified Facility Procedures CPT4 Code: [id number] Description: [id number] - [place] W/O ANES NON SELECT Modifier: Quantity: 1 Physician Procedures CPT4 Code Description: [id number] - WC PHYS LEVEL 4 - EST PT ICD-10 Diagnosis Description I73.89 Other specified peripheral vascular diseases L97.818 Non-pressure chronic ulcer of other part of right lower leg wit N18.3 Chronic kidney disease, stage 3  (moderate) I48.20 Chronic atrial fibrillation, unspecified Modifier: h other specified Quantity: 1 severity Electronic Signature(s) Signed: 07/28/2018 4:17:19 PM By: Worthy Keeler PA-C Entered By: Worthy Keeler on 07/28/2018 09:59:20

## 2018-08-01 NOTE — Progress Notes (Signed)
[name] ([id number]) Visit Report for 07/28/2018 Arrival Information Details Patient Name: [name] Date of Service: 07/28/2018 9:30 AM Medical Record Number: [id number] Patient Account Number: [id number] Date of Birth/Sex: 22-Aug-1925 (85 y.o. F) Treating RN: [name] Primary Care Karys Meckley: Thereasa Distance Other Clinician: Referring Nathanial Arrighi: Thereasa Distance Treating Marte Celani/Extender: Melburn Hake, HOYT Weeks in Treatment: 8 Visit Information History Since Last Visit Added or deleted any medications: No Patient Arrived: Wheel Chair Any new allergies or adverse reactions: No Arrival Time: 09:20 Had a fall or experienced change in No Accompanied By: self activities of daily living that may affect Transfer Assistance: None risk of falls: Patient Identification Verified: Yes Signs or symptoms of abuse/neglect since last visito No Secondary Verification Process Completed: Yes Hospitalized since last visit: No Patient Has Alerts: Yes Implantable device outside of the clinic excluding No Patient Alerts: DMII cellular tissue based products placed in the center since last visit: Has Dressing in Place as Prescribed: Yes Pain Present Now: No Electronic Signature(s) Signed: 07/28/2018 12:04:52 PM By: Lorine Bears RCP, RRT, CHT Entered By: Lorine Bears on 07/28/2018 09:21:32 Simerly, [name] ([id number]) -------------------------------------------------------------------------------- Lower Extremity Assessment Details Patient Name: [name] Date of Service: 07/28/2018 9:30 AM Medical Record Number: [id number] Patient Account Number: [id number] Date of Birth/Sex: 1926-02-09 (85 y.o. F) Treating RN: Cornell Barman Primary Care Lyn Deemer: Thereasa Distance Other Clinician: Referring Hannah Strader: Thereasa Distance Treating Tamari Redwine/Extender: Melburn Hake, HOYT Weeks in Treatment: 8 Vascular Assessment Pulses: Dorsalis Pedis Palpable:  [Right:Yes] Electronic Signature(s) Signed: 08/01/2018 3:23:54 PM By: Gretta Cool, BSN, RN, CWS, Kim RN, BSN Entered By: Gretta Cool, BSN, RN, CWS, Kim on 07/28/2018 09:29:05 Loftus, [name] ([id number]) -------------------------------------------------------------------------------- Multi Wound Chart Details Patient Name: [name] Date of Service: 07/28/2018 9:30 AM Medical Record Number: [id number] Patient Account Number: [id number] Date of Birth/Sex: 06-30-1925 (85 y.o. F) Treating RN: [name] Primary Care Renai Lopata: Thereasa Distance Other Clinician: Referring Bijon Mineer: Thereasa Distance Treating Cashel Bellina/Extender: Melburn Hake, HOYT Weeks in Treatment: 8 Vital Signs Height(in): 63 Pulse(bpm): 70 Weight(lbs): 102 Blood Pressure(mmHg): 131/45 Body Mass Index(BMI): 18 Temperature(F): 97.7 Respiratory Rate 16 (breaths/min): Photos: [N/A:N/A] Wound Location: Right Achilles N/A N/A Wounding Event: Gradually Appeared N/A N/A Primary Etiology: Arterial Insufficiency Ulcer N/A N/A Secondary Etiology: Diabetic Wound/Ulcer of the N/A N/A Lower Extremity Comorbid History: Anemia, Arrhythmia, N/A N/A Congestive Heart Failure, Hypertension, Type II Diabetes, End Stage Renal Disease, Osteoarthritis Date Acquired: 04/01/2018 N/A N/A Weeks of Treatment: 8 N/A N/A Wound Status: Open N/A N/A Pending Amputation on Yes N/A N/A Presentation: Measurements L x W x D 4x2.4x0.3 N/A N/A (cm) Area (cm) : 7.54 N/A N/A Volume (cm) : 2.262 N/A N/A % Reduction in Area: -57.90% N/A N/A % Reduction in Volume: -373.20% N/A N/A Classification: Full Thickness With Exposed N/A N/A Support Structures Exudate Amount: Medium N/A N/A Exudate Type: Serous N/A N/A Exudate Color: amber N/A N/A Wound Margin: Flat and Intact N/A N/A Granulation Amount: None Present (0%) N/A N/A Necrotic Amount: Large (67-100%) N/A N/A Jauregui, Kennethia J. ([id number]) Necrotic Tissue: Eschar, Adherent Slough N/A N/A Exposed  Structures: Fat Layer (Subcutaneous N/A N/A Tissue) Exposed: Yes Tendon: Yes Fascia: No Muscle: No Joint: No Bone: No Epithelialization: None N/A N/A Periwound Skin Texture: Excoriation: No N/A N/A Induration: No Callus: No Crepitus: No Rash: No Scarring: No Periwound Skin Moisture: Maceration: No N/A N/A Dry/Scaly: No Periwound Skin Color: Rubor: Yes N/A N/A Atrophie Blanche: No Cyanosis: No Ecchymosis: No Erythema: No Hemosiderin Staining: No Mottled: No Pallor: No  Temperature: No Abnormality N/A N/A Tenderness on Palpation: Yes N/A N/A Treatment Notes Electronic Signature(s) Signed: 07/28/2018 4:04:21 PM By: [name] Entered By: [name] on 07/28/2018 09:42:36 Schappert, [name] ([id number]) -------------------------------------------------------------------------------- Multi-Disciplinary Care Plan Details Patient Name: [name] Date of Service: 07/28/2018 9:30 AM Medical Record Number: [id number] Patient Account Number: [id number] Date of Birth/Sex: 21-Nov-1925 (85 y.o. F) Treating RN: [name] Primary Care Eliz Nigg: Thereasa Distance Other Clinician: Referring Derak Schurman: Thereasa Distance Treating Garrette Caine/Extender: Melburn Hake, HOYT Weeks in Treatment: 8 Active Inactive Abuse / Safety / Falls / Self Care Management Nursing Diagnoses: Potential for falls Goals: Patient will not experience any injury related to falls Date Initiated: 06/16/2018 Target Resolution Date: 09/16/2018 Goal Status: Active Interventions: Assess fall risk on admission and as needed Notes: Necrotic Tissue Nursing Diagnoses: Knowledge deficit related to management of necrotic/devitalized tissue Goals: Patient/caregiver will verbalize understanding of reason and process for debridement of necrotic tissue Date Initiated: 06/16/2018 Target Resolution Date: 09/16/2018 Goal Status: Active Interventions: Provide education on necrotic tissue and debridement  process Notes: Nutrition Nursing Diagnoses: Imbalanced nutrition Goals: Patient/caregiver agrees to and verbalizes understanding of need to use nutritional supplements and/or vitamins as prescribed Date Initiated: 06/16/2018 Target Resolution Date: 09/16/2018 Goal Status: Active Interventions: Assess patient nutrition upon admission and as needed per policy YITTA, GONGAWARE ([id number]) Notes: Wound/Skin Impairment Nursing Diagnoses: Impaired tissue integrity Knowledge deficit related to ulceration/compromised skin integrity Goals: Ulcer/skin breakdown will have a volume reduction of 30% by week 4 Date Initiated: 06/02/2018 Target Resolution Date: 07/01/2018 Goal Status: Active Interventions: Assess patient/caregiver ability to obtain necessary supplies Assess patient/caregiver ability to perform ulcer/skin care regimen upon admission and as needed Assess ulceration(s) every visit Notes: Electronic Signature(s) Signed: 07/28/2018 4:04:21 PM By: [name] Entered By: [name] on 07/28/2018 09:42:31 Dea, [name] ([id number]) -------------------------------------------------------------------------------- Pain Assessment Details Patient Name: [name] Date of Service: 07/28/2018 9:30 AM Medical Record Number: [id number] Patient Account Number: [id number] Date of Birth/Sex: 02-12-1926 (85 y.o. F) Treating RN: [name] Primary Care Tahari Clabaugh: Thereasa Distance Other Clinician: Referring Georgeana Oertel: Thereasa Distance Treating Kimberly Coye/Extender: Melburn Hake, HOYT Weeks in Treatment: 8 Active Problems Location of Pain Severity and Description of Pain Patient Has Paino No Site Locations Pain Management and Medication Current Pain Management: Electronic Signature(s) Signed: 07/28/2018 12:04:52 PM By: Paulla Fore, RRT, CHT Signed: 07/28/2018 4:04:21 PM By: [name] Entered By: Lorine Bears on 07/28/2018 09:21:39 Rueb, [name]  ([id number]) -------------------------------------------------------------------------------- Wound Assessment Details Patient Name: [name] Date of Service: 07/28/2018 9:30 AM Medical Record Number: [id number] Patient Account Number: [id number] Date of Birth/Sex: 1925/05/13 (85 y.o. 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 Wounding Event: Gradually Appeared Etiology: Date Acquired: 04/01/2018 Wound Open Weeks Of Treatment: 8 Status: Clustered Wound: No Comorbid Anemia, Arrhythmia, Congestive Heart Failure, Pending Amputation On Presentation History: Hypertension, Type II Diabetes, End Stage Renal Disease, Osteoarthritis Photos Wound Measurements Length: (cm) 4 % Reduction Width: (cm) 2.4 % Reduction Depth: (cm) 0.3 Epitheliali Area: (cm) 7.54 Tunneling: Volume: (cm) 2.262 Underminin in Area: -57.9% in Volume: -373.2% zation: None No g: No Wound Description Full Thickness With Exposed Support Foul Odor Classification: Structures Slough/Fib Wound Margin: Flat and Intact Exudate Medium Amount: Exudate Type: Serous Exudate Color: amber After Cleansing: No  rino Yes Wound Bed Granulation Amount: None Present (0%) Exposed Structure Necrotic Amount: Large (67-100%) Fascia Exposed: No Necrotic Quality: Eschar, Adherent Slough Fat Layer (Subcutaneous Tissue) Exposed: Yes Tendon Exposed: Yes Muscle Exposed: No Joint Exposed: No Pletz, Paysen J. ([id number]) Bone Exposed: No Periwound Skin Texture Texture Color No Abnormalities Noted: No No Abnormalities Noted: No Callus: No Atrophie Blanche: No Crepitus: No Cyanosis: No Excoriation: No Ecchymosis:  No Induration: No Erythema: No Rash: No Hemosiderin Staining: No Scarring: No Mottled: No Pallor: No Moisture Rubor: Yes No Abnormalities Noted: No Dry / Scaly: No Temperature / Pain Maceration: No Temperature: No Abnormality Tenderness on Palpation: Yes Treatment Notes Wound #1 (Right Achilles) Notes santyl, xeroform, abd, conform to secure Electronic Signature(s) Signed: 08/01/2018 3:23:54 PM By: Gretta Cool, BSN, RN, CWS, Kim RN, BSN Entered By: Gretta Cool, BSN, RN, CWS, Kim on 07/28/2018 09:28:53 Messman, [name] ([id number]) -------------------------------------------------------------------------------- Vitals Details Patient Name: [name] Date of Service: 07/28/2018 9:30 AM Medical Record Number: [id number] Patient Account Number: [id number] Date of Birth/Sex: 1925/08/31 (85 y.o. F) Treating RN: [name] Primary Care Reshunda Strider: Thereasa Distance Other Clinician: Referring Tamarra Geiselman: Thereasa Distance Treating Asra Gambrel/Extender: Melburn Hake, HOYT Weeks in Treatment: 8 Vital Signs Time Taken: 09:21 Temperature (F): 97.7 Height (in): 63 Pulse (bpm): 70 Weight (lbs): 102 Respiratory Rate (breaths/min): 16 Body Mass Index (BMI): 18.1 Blood Pressure (mmHg): 131/45 Reference Range: 80 - 120 mg / dl Electronic Signature(s) Signed: 07/28/2018 12:04:52 PM By: Lorine Bears RCP, RRT, CHT Entered By: Becky Sax, Amado Nash on 07/28/2018 09:23:54

## 2018-08-04 ENCOUNTER — Encounter: Payer: Medicare Other | Admitting: Physician Assistant

## 2018-08-04 ENCOUNTER — Other Ambulatory Visit: Payer: Self-pay

## 2018-08-04 DIAGNOSIS — E11622 Type 2 diabetes mellitus with other skin ulcer: Secondary | ICD-10-CM | POA: Diagnosis not present

## 2018-08-05 NOTE — Progress Notes (Signed)
[name], [name] ([id number]) Visit Report for 08/04/2018 Arrival Information Details Patient Name: [name], [name] Date of Service: 08/04/2018 12:30 PM Medical Record Number: [id number] Patient Account Number: [id number] Date of Birth/Sex: 1926-03-24 (85 y.o. F) Treating RN: Cornell Barman Primary Care Durga Saldarriaga: Thereasa Distance Other Clinician: Referring Towanna Avery: Thereasa Distance Treating Nicolemarie Wooley/Extender: Melburn Hake, HOYT Weeks in Treatment: 9 Visit Information History Since Last Visit Added or deleted any medications: Yes Patient Arrived: Wheel Chair Any new allergies or adverse reactions: No Arrival Time: 13:13 Had a fall or experienced change in No Accompanied By: son activities of daily living that may affect Transfer Assistance: Manual risk of falls: Patient Identification Verified: Yes Signs or symptoms of abuse/neglect since last visito No Secondary Verification Process Completed: Yes Hospitalized since last visit: No Patient Has Alerts: Yes Implantable device outside of the clinic excluding No Patient Alerts: DMII cellular tissue based products placed in the center since last visit: Has Dressing in Place as Prescribed: Yes Pain Present Now: No Electronic Signature(s) Signed: 08/04/2018 5:05:21 PM By: Gretta Cool, BSN, RN, CWS, Kim RN, BSN Entered By: Gretta Cool, BSN, RN, CWS, Kim on 08/04/2018 13:15:05 Udell, [name] ([id number]) -------------------------------------------------------------------------------- Lower Extremity Assessment Details Patient Name: [name] Date of Service: 08/04/2018 12:30 PM Medical Record Number: [id number] Patient Account Number: [id number] Date of Birth/Sex: 14-Jun-1925 (85 y.o. F) Treating RN: Cornell Barman Primary Care Colbin Jovel: Thereasa Distance Other Clinician: Referring Shantera Monts: Thereasa Distance Treating Taresa Montville/Extender: Melburn Hake, HOYT Weeks in Treatment: 9 Vascular Assessment Pulses: Dorsalis Pedis Palpable: [Right:Yes] Electronic  Signature(s) Signed: 08/04/2018 5:05:21 PM By: Gretta Cool, BSN, RN, CWS, Kim RN, BSN Entered By: Gretta Cool, BSN, RN, CWS, Kim on 08/04/2018 13:21:19 Mctighe, [name] ([id number]) -------------------------------------------------------------------------------- Multi Wound Chart Details Patient Name: [name] Date of Service: 08/04/2018 12:30 PM Medical Record Number: [id number] Patient Account Number: [id number] Date of Birth/Sex: 26-Apr-1925 (85 y.o. F) Treating RN: [name] Primary Care Leone Mobley: Thereasa Distance Other Clinician: Referring Violanda Bobeck: Thereasa Distance Treating Reine Bristow/Extender: Melburn Hake, HOYT Weeks in Treatment: 9 Vital Signs Height(in): 63 Pulse(bpm): 54 Weight(lbs): 102 Blood Pressure(mmHg): 157/77 Body Mass Index(BMI): 18 Temperature(F): 98.2 Respiratory Rate 16 (breaths/min): Photos: [N/A:N/A] Wound Location: Right Achilles N/A N/A Wounding Event: Gradually Appeared N/A N/A Primary Etiology: Arterial Insufficiency Ulcer N/A N/A Secondary Etiology: Diabetic Wound/Ulcer of the N/A N/A Lower Extremity Comorbid History: Anemia, Arrhythmia, N/A N/A Congestive Heart Failure, Hypertension, Type II Diabetes, End Stage Renal Disease, Osteoarthritis Date Acquired: 04/01/2018 N/A N/A Weeks of Treatment: 9 N/A N/A Wound Status: Open N/A N/A Pending Amputation on Yes N/A N/A Presentation: Measurements L x W x D 4.5x2.5x0.1 N/A N/A (cm) Area (cm) : 8.836 N/A N/A Volume (cm) : 0.884 N/A N/A % Reduction in Area: -85.00% N/A N/A % Reduction in Volume: -84.90% N/A N/A Classification: Full Thickness With Exposed N/A N/A Support Structures Exudate Amount: Medium N/A N/A Exudate Type: Serous N/A N/A Exudate Color: amber N/A N/A Wound Margin: Flat and Intact N/A N/A Granulation Amount: None Present (0%) N/A N/A Necrotic Amount: Large (67-100%) N/A N/A Rothman, Taylar J. ([id number]) Necrotic Tissue: Eschar, Adherent Slough N/A N/A Exposed Structures: Fat Layer  (Subcutaneous N/A N/A Tissue) Exposed: Yes Tendon: Yes Fascia: No Muscle: No Joint: No Bone: No Epithelialization: None N/A N/A Periwound Skin Texture: Excoriation: No N/A N/A Induration: No Callus: No Crepitus: No Rash: No Scarring: No Periwound Skin Moisture: Maceration: No N/A N/A Dry/Scaly: No Periwound Skin Color: Rubor: Yes N/A N/A Atrophie Blanche: No Cyanosis: No Ecchymosis: No Erythema: No Hemosiderin Staining: No Mottled:  No Pallor: No Temperature: No Abnormality N/A N/A Tenderness on Palpation: Yes N/A N/A Treatment Notes Electronic Signature(s) Signed: 08/04/2018 4:49:53 PM By: [name] Entered By: [name] on 08/04/2018 13:49:53 Deloach, [name] ([id number]) -------------------------------------------------------------------------------- Multi-Disciplinary Care Plan Details Patient Name: [name] Date of Service: 08/04/2018 12:30 PM Medical Record Number: [id number] Patient Account Number: [id number] Date of Birth/Sex: 01/05/1926 (85 y.o. F) Treating RN: [name] Primary Care Keeara Frees: Thereasa Distance Other Clinician: Referring Huxton Glaus: Thereasa Distance Treating Trestin Vences/Extender: Melburn Hake, HOYT Weeks in Treatment: 9 Active Inactive Abuse / Safety / Falls / Self Care Management Nursing Diagnoses: Potential for falls Goals: Patient will not experience any injury related to falls Date Initiated: 06/16/2018 Target Resolution Date: 09/16/2018 Goal Status: Active Interventions: Assess fall risk on admission and as needed Notes: Necrotic Tissue Nursing Diagnoses: Knowledge deficit related to management of necrotic/devitalized tissue Goals: Patient/caregiver will verbalize understanding of reason and process for debridement of necrotic tissue Date Initiated: 06/16/2018 Target Resolution Date: 09/16/2018 Goal Status: Active Interventions: Provide education on necrotic tissue and debridement process Notes: Nutrition Nursing  Diagnoses: Imbalanced nutrition Goals: Patient/caregiver agrees to and verbalizes understanding of need to use nutritional supplements and/or vitamins as prescribed Date Initiated: 06/16/2018 Target Resolution Date: 09/16/2018 Goal Status: Active Interventions: Assess patient nutrition upon admission and as needed per policy ELYSABETH, AUST ([id number]) Notes: Wound/Skin Impairment Nursing Diagnoses: Impaired tissue integrity Knowledge deficit related to ulceration/compromised skin integrity Goals: Ulcer/skin breakdown will have a volume reduction of 30% by week 4 Date Initiated: 06/02/2018 Target Resolution Date: 07/01/2018 Goal Status: Active Interventions: Assess patient/caregiver ability to obtain necessary supplies Assess patient/caregiver ability to perform ulcer/skin care regimen upon admission and as needed Assess ulceration(s) every visit Notes: Electronic Signature(s) Signed: 08/04/2018 4:49:53 PM By: [name] Entered By: [name] on 08/04/2018 13:49:47 Damore, [name] ([id number]) -------------------------------------------------------------------------------- Pain Assessment Details Patient Name: [name] Date of Service: 08/04/2018 12:30 PM Medical Record Number: [id number] Patient Account Number: [id number] Date of Birth/Sex: 02/13/1926 (85 y.o. F) Treating RN: Cornell Barman Primary Care Renaud Celli: Thereasa Distance Other Clinician: Referring Jamayia Croker: Thereasa Distance Treating Shaquavia Whisonant/Extender: Melburn Hake, HOYT Weeks in Treatment: 9 Active Problems Location of Pain Severity and Description of Pain Patient Has Paino No Site Locations Pain Management and Medication Current Pain Management: Electronic Signature(s) Signed: 08/04/2018 5:05:21 PM By: Gretta Cool, BSN, RN, CWS, Kim RN, BSN Entered By: Gretta Cool, BSN, RN, CWS, Kim on 08/04/2018 13:15:12 Seigler, [name]  ([id number]) -------------------------------------------------------------------------------- Patient/Caregiver Education Details Patient Name: [name] Date of Service: 08/04/2018 12:30 PM Medical Record Number: [id number] Patient Account Number: [id number] Date of Birth/Gender: 09-17-1925 (85 y.o. F) Treating RN: [name] Primary Care Physician: Thereasa Distance Other Clinician: Referring Physician: Thereasa Distance Treating Physician/Extender: Sharalyn Ink in Treatment: 9 Education Assessment Education Provided To: Patient and Caregiver Education Topics Provided Wound/Skin Impairment: Handouts: Other: wound care as ordered Methods: Demonstration, Explain/Verbal Responses: State content correctly Electronic Signature(s) Signed: 08/04/2018 4:49:53 PM By: [name] Entered By: [name] on 08/04/2018 13:51:52 Huie, [name] ([id number]) -------------------------------------------------------------------------------- Wound Assessment Details Patient Name: [name] Date of Service: 08/04/2018 12:30 PM Medical Record Number: [id number] Patient Account Number: [id number] Date of Birth/Sex: 05/08/1925 (85 y.o. 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 Wounding Event: Gradually Appeared  Etiology: Date Acquired: 04/01/2018 Wound Open Weeks Of Treatment: 9 Status: Clustered Wound: No Comorbid Anemia, Arrhythmia, Congestive Heart Failure, Pending Amputation On Presentation History: Hypertension, Type II Diabetes, End Stage Renal Disease, Osteoarthritis Photos Wound Measurements Length: (cm) 4.5 Width: (cm) 2.5 Depth: (cm) 0.1 Area: (cm)  8.836 Volume: (cm) 0.884 % Reduction in Area: -85% % Reduction in Volume: -84.9% Epithelialization: None Tunneling: No Undermining: No Wound Description Full Thickness With Exposed Support Foul Odor Classification: Structures Slough/Fi Wound Margin: Flat and Intact Exudate Medium Amount: Exudate Type: Serous Exudate Color: amber After Cleansing: No brino Yes Wound Bed Granulation Amount: None Present (0%) Exposed Structure Necrotic Amount: Large (67-100%) Fascia Exposed: No Necrotic Quality: Eschar, Adherent Slough Fat Layer (Subcutaneous Tissue) Exposed: Yes Tendon Exposed: Yes Muscle Exposed: No Joint Exposed: No Throne, Corie J. ([id number]) Bone Exposed: No Periwound Skin Texture Texture Color No Abnormalities Noted: No No Abnormalities Noted: No Callus: No Atrophie Blanche: No Crepitus: No Cyanosis: No Excoriation: No Ecchymosis: No Induration: No Erythema: No Rash: No Hemosiderin Staining: No Scarring: No Mottled: No Pallor: No Moisture Rubor: Yes No Abnormalities Noted: No Dry / Scaly: No Temperature / Pain Maceration: No Temperature: No Abnormality Tenderness on Palpation: Yes Treatment Notes Wound #1 (Right Achilles) Notes santyl, xeroform, abd, conform to secure Electronic Signature(s) Signed: 08/04/2018 5:05:21 PM By: Gretta Cool, BSN, RN, CWS, Kim RN, BSN Entered By: Gretta Cool, BSN, RN, CWS, Kim on 08/04/2018 13:20:49 Jacquot, [name] ([id number]) -------------------------------------------------------------------------------- Vitals Details Patient Name: [name] Date of Service: 08/04/2018 12:30 PM Medical Record Number: [id number] Patient Account Number: [id number] Date of Birth/Sex: [birth year] (85 y.o. F) Treating RN: Cornell Barman Primary Care Dyami Umbach: Thereasa Distance Other Clinician: Referring Caridad Silveira: Thereasa Distance Treating Yariana Hoaglund/Extender: Melburn Hake, HOYT Weeks in Treatment: 9 Vital Signs Time Taken: 13:16 Temperature (F):  98.2 Height (in): 63 Pulse (bpm): 82 Weight (lbs): 102 Respiratory Rate (breaths/min): 16 Body Mass Index (BMI): 18.1 Blood Pressure (mmHg): 157/77 Reference Range: 80 - 120 mg / dl Electronic Signature(s) Signed: 08/04/2018 5:05:21 PM By: Gretta Cool, BSN, RN, CWS, Kim RN, BSN Entered By: Gretta Cool, BSN, RN, CWS, Kim on 08/04/2018 13:16:47

## 2018-08-05 NOTE — Progress Notes (Signed)
YISELL, SPRUNGER ([id number]) Visit Report for 08/04/2018 Chief Complaint Document Details Patient Name: [name], [name] Date of Service: 08/04/2018 12:30 PM Medical Record Number: [id number] Patient Account Number: [id number] Date of Birth/Sex: Jul 15, 1925 (85 y.o. F) Treating RN: [name] Primary Care Provider: Thereasa Distance Other Clinician: Referring Provider: Thereasa Distance Treating Provider/Extender: Melburn Hake, HOYT Weeks in Treatment: 9 Information Obtained from: Patient Chief Complaint Right Lower leg Achilles ulcer Electronic Signature(s) Signed: 08/04/2018 4:48:18 PM By: Worthy Keeler PA-C Entered By: Worthy Keeler on 08/04/2018 13:11:51 [name], [name] ([id number]) -------------------------------------------------------------------------------- Debridement Details Patient Name: [name] Date of Service: 08/04/2018 12:30 PM Medical Record Number: [id number] Patient Account Number: [id number] Date of Birth/Sex: [birth year] (85 y.o. F) Treating RN: [name] Primary Care Provider: Thereasa Distance Other Clinician: Referring Provider: Thereasa Distance Treating Provider/Extender: Melburn Hake, HOYT Weeks in Treatment: 9 Debridement Performed for Wound #1 Right Achilles Assessment: Performed By: Clinician [name], RN Debridement Type: Chemical/Enzymatic/Mechanical Agent Used: Santyl Severity of Tissue Pre Necrosis of muscle Debridement: Level of Consciousness (Pre- Awake and Alert procedure): Pre-procedure Verification/Time Yes - 13:51 Out Taken: Start Time: 13:51 Pain Control: Lidocaine 4% Topical Solution Instrument: Other : tongue blade Bleeding: None End Time: 13:52 Procedural Pain: 0 Post Procedural Pain: 0 Response to Treatment: Procedure was tolerated well Level of Consciousness Awake and Alert (Post-procedure): Post Debridement Measurements of Total Wound Length: (cm) 4.5 Width: (cm) 2.5 Depth: (cm) 0.1 Volume: (cm)  0.884 Character of Wound/Ulcer Post Debridement: Improved Severity of Tissue Post Debridement: Necrosis of muscle Post Procedure Diagnosis Same as Pre-procedure Electronic Signature(s) Signed: 08/04/2018 4:48:18 PM By: Worthy Keeler PA-C Signed: 08/04/2018 4:49:53 PM By: [name] Entered By: [name] on 08/04/2018 13:51:35 Renaud, [name] ([id number]) -------------------------------------------------------------------------------- HPI Details Patient Name: [name] Date of Service: 08/04/2018 12:30 PM Medical Record Number: [id number] Patient Account Number: [id number] Date of Birth/Sex: [birth year] (85 y.o. F) Treating RN: [name] Primary Care Provider: Thereasa Distance Other Clinician: Referring Provider: Thereasa Distance Treating Provider/Extender: Melburn Hake, HOYT Weeks in Treatment: 9 History of Present Illness HPI Description: 06/02/18 patient presents today for initial evaluation our clinic concerning the ulcers that she has on the right Achilles location. 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 She was subsequently supposed to have had a scheduled likely stent placement in December 2019 unfortunately she had a fall with a sub dural hematoma which subsequently had to be evacuated partially at least. For that reason she has never gotten back in with Dr. Delana Meyer to have the vascular procedure. She does not have any appointment scheduled with him at this time. The patient does have a history of chronic kidney disease stage III, congestive heart failure, trophy relation, and at this point home health who has been coming out has been using [facility]. This is been since she's been home towards the end of December as best we can tell. She does have a hemoglobin A1c of 7.0 which was obtained on 11/18/17. Currently the patient's right lower extremity that showed diminished blood flow according to her arterial  study which is dated 02/27/18. At that point her right ABI was 0.89 with a TBI of 0.36 and her left ABI was 1.13 with a TBI of 0.50. Her prior study which was in May on the 13th 2019 showed that she had a right ABI of one and a right TBI of 0.51 with a left ABI of 0.72 and a  TBI of 0.50. Obviously since May till now her right leg has diminished as far as the blood flows concerned patient's son who was with her today states that Dr. 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 I do believe that for limb salvage this patient needs to have the above stated procedure with Dr. Delana Meyer as soon as possible. 06/09/18 on evaluation today patient actually appears to be doing about the same in regard to her Achilles ulcer. This actually does have tended exposed however the tenant is remaining moist which is good news that's exactly what we want to see. Fortunately there's no signs of infection she does have an appointment with Dr. Delana Meyer this coming Tuesday, June 13, 2018. 06/16/18 on evaluation today patient actually appears to be doing rather well in regard to her right lower extremity at this point. The Achilles ulcer actually showed signs of being a little bit better as far as the overall appearance of the wound was concerned today. Fortunately there did not appear to be the evidence of infection at this time which is good news. With that being said she did have her angiogram where she did actually have a significant blockage of the 90% into her lower extremity. Fortunately between the balloon angioplasty followed by stating this was able to be improved to less than 5% residual stenosis according to Dr. Nino Parsley note that I read. She still may have some limitation into her foot in particular as far as the runoff is concerned but nonetheless in general appears to be doing much better which is great news. Fortunately there is no sign of infection at this time. 06/23/18 on evaluation  today patient's wound bed actually appears to be doing about the same although the central portion of the tendon actually appears to be somewhat dry this is definitely not good news. We want to try to keep this moist while allowing the edges of the wound hopefully start to cover the tendon that is the goal. Nonetheless I think that we may need to switch things up a little bit to try to improve the moisture trapping at this point. She does have better blood flow which is great we have applied for EpiFix although unfortunately as of right now this shows to be not covered. I think that we need to try to see about getting this approved as with the tenant expose me to do everything we can to try to get this covering of the skin as soon as possible. Again I'm not even sure that she'd be a candidate for plastic surgery intervention. 06/30/18 on evaluation today patient actually appears to be doing about the same in regard to her wound on the right Achilles region. The Achilles tendon is still exposed there does not appear to be any tissue growth around the edge while the tendon itself does not appear to be quite as dry as it was previous there's definitely necrosis of the tenant beginning to be noted unfortunately. For that reason I think that we really need to make an appointment for her to see a specialist in order to discuss the options with her in regard to this one. Fortunately there does not appear to be any signs of systemic infection or even local infection for that matter. 07/07/18 on evaluation today patient appears to be doing about the same in regard to her Achilles region also. There does not appear to be any signs of active infection at this time which is  good news. With that being said her Achilles tendon is very dry and appears to be more so due to the fact that apparently the last dressing that was put on was used hydrogel with a Dry gauze of the top not what had been previously recommended.  For that reason again the tendon does appear to be quite a bit Jeff, Lakira J. ([id number]) more dry than what it was previous unfortunately. They still have not heard from orthopedics is for the referral for her Achilles region. We had made this referral last week on Tuesday the sign still hadn't heard anything and the office told him that they did not have a referral from [place] we therefore reset the referral then he still has not heard anything. 07/14/18 on evaluation today patient appears to be doing about the same in regard to her right Achilles ulcer. She has been tolerating the dressing changes without complication. With that being said nothing seems to really improve significantly in my pinion. She did see the orthopedic doctor earlier this week they recommended a referral to Dr. Vickki Muff her podiatrist for further evaluation as well as a potential referral to plastic surgery although that is not scheduled as of yet. The patient is scheduled to see her podiatrist on Monday. There's no signs of active infection at this time. 07/21/18 on evaluation today patient was actually seen on 07/17/18 by Dr. Vickki Muff who is a local podiatrist. Subsequently he did sharply debride in the office the wound to some degree to try to clear off the eschar and see how much of the tenant underlying was actually damaged. He however felt that with the patient really needed was more significant debridement of the Achilles tendon to clear this away and that a Wound VAC following. However he considers this a semi-elective surgery which subsequently the hospitals not allowing at this point. Nonetheless the patient son was present during the evaluation that she had there with the podiatrist and did we count that exactly what I was saying was exactly what he remembered as well. No fevers, chills, nausea, or vomiting noted at this time. 07/28/18 on evaluation today patient appears to be doing a little better in regard to the necrotic  tendon loosen up on the surface of her wound. She's been tolerating the dressing changes without complication. That does not appear to be any signs of active infection at this time which is good news. I'll be said that something we're trying to avoid. She does see Dr. Vickki Muff on Monday and just a couple of days. 08/04/18 on evaluation today patient appears to be doing about the same in regard to her wound on the posterior Achilles region. The tendon is still softening up but does not appear to show any signs of infection at this time. She did see her podiatrist on Monday he still feels like she needs surgery to remove the tendon in its entirety although right now again still with the hospital lockdown there's no indication that this can be done. Again this is due to the nature of surgeries that are allowed and hers apparently does not fall in the category of essential. Nonetheless I think that this is something that needs to be done sooner rather than later based on what I'm seeing. Electronic Signature(s) Signed: 08/04/2018 4:48:18 PM By: Worthy Keeler PA-C Entered By: Worthy Keeler on 08/04/2018 14:28:09 [name], [name] ([id number]) -------------------------------------------------------------------------------- Physical Exam Details Patient Name: [name] Date of Service: 08/04/2018 12:30 PM Medical Record  Number: [id number] Patient Account Number: [id number] Date of Birth/Sex: Jan 23, 1926 (85 y.o. F) Treating RN: [name] Primary Care Provider: Thereasa Distance Other Clinician: Referring Provider: Thereasa Distance Treating Provider/Extender: Melburn Hake, HOYT Weeks in Treatment: 9 Constitutional Well-nourished and well-hydrated in no acute distress. Respiratory normal breathing without difficulty. clear to auscultation bilaterally. Cardiovascular regular rate and rhythm with normal S1, S2. Psychiatric this patient is able to make decisions and demonstrates good insight into  disease process. Alert and Oriented x 3. pleasant and cooperative. Notes Patient's wound bed currently showed signs of necrotic tendon still and though this seems to be softening up to some degree there does not appear to be any evidence of active infection at this time. Electronic Signature(s) Signed: 08/04/2018 4:48:18 PM By: Worthy Keeler PA-C Entered By: Worthy Keeler on 08/04/2018 14:31:10 [name], [name] ([id number]) -------------------------------------------------------------------------------- Physician Orders Details Patient Name: [name] Date of Service: 08/04/2018 12:30 PM Medical Record Number: [id number] Patient Account Number: [id number] Date of Birth/Sex: 06-29-1925 (85 y.o. F) Treating RN: [name] Primary Care Provider: Thereasa Distance Other Clinician: Referring Provider: Thereasa Distance Treating Provider/Extender: Melburn Hake, HOYT Weeks in Treatment: 9 Verbal / Phone Orders: No Diagnosis Coding ICD-10 Coding Code Description I73.89 Other specified peripheral vascular diseases L97.818 Non-pressure chronic ulcer of other part of right lower leg with other specified severity N18.3 Chronic kidney disease, stage 3 (moderate) I48.20 Chronic atrial fibrillation, unspecified Wound Cleansing Wound #1 Right Achilles o Clean wound with Normal Saline. Primary Wound Dressing Wound #1 Right Achilles o Santyl Ointment - to wound bed - HHRN to do this as well please o Xeroform - over santyl to trap moisture on tendon Secondary Dressing Wound #1 Right Achilles o ABD and Kerlix/Conform Dressing Change Frequency Wound #1 Right Achilles o Change dressing every day. Follow-up Appointments Wound #1 Right Achilles o Return Appointment in 1 week. Home Health Wound #1 Right Achilles o Continue Home Health Visits o Home Health Nurse may visit PRN to address patientos wound care needs. o FACE TO FACE ENCOUNTER: MEDICARE and MEDICAID PATIENTS: I  certify that this patient is under my care and that I had a face-to-face encounter that meets the physician face-to-face encounter requirements with this patient on this date. The encounter with the patient was in whole or in part for the following MEDICAL CONDITION: (primary reason for [place]) MEDICAL NECESSITY: I certify, that based on my findings, NURSING services are a medically necessary home health service. HOME BOUND STATUS: I certify that my clinical findings support that this patient is homebound (i.e., Due to illness or injury, pt requires aid of supportive devices such as crutches, cane, wheelchairs, walkers, the use of special transportation or the assistance of another person to leave their place of residence. There is a normal inability to leave the home and doing so requires considerable and taxing effort. Other absences are for medical reasons / religious services and are infrequent or of short duration when for other reasons). JEANEAN, HOLLETT ([id number]) o If current dressing causes regression in wound condition, may D/C ordered dressing product/s and apply Normal Saline Moist Dressing daily until next [place] / Other MD appointment. [place] of regression in wound condition at [phone]. o Please direct any NON-WOUND related issues/requests for orders to patient's Primary Care Physician Medications-please add to medication list. Wound #1 Right Achilles o Santyl Enzymatic Ointment Electronic Signature(s) Signed: 08/04/2018 4:48:18 PM By: Worthy Keeler PA-C Signed: 08/04/2018 4:49:53 PM By: Marjory Lies,  Di Carlson Entered By: [name] on 08/04/2018 13:50:22 Cressy, [name] ([id number]) -------------------------------------------------------------------------------- Problem List Details Patient Name: [name] Date of Service: 08/04/2018 12:30 PM Medical Record Number: [id number] Patient Account Number: [id number] Date of  Birth/Sex: February 27, 1926 (85 y.o. F) Treating RN: [name] Primary Care Provider: Thereasa Distance Other Clinician: Referring Provider: Thereasa Distance Treating Provider/Extender: Melburn Hake, HOYT Weeks in Treatment: 9 Active Problems ICD-10 Evaluated Encounter Code Description Active Date Today Diagnosis I73.89 Other specified peripheral vascular diseases 06/02/2018 No Yes L97.818 Non-pressure chronic ulcer of other part of right lower leg 06/02/2018 No Yes with other specified severity N18.3 Chronic kidney disease, stage 3 (moderate) 06/02/2018 No Yes I48.20 Chronic atrial fibrillation, unspecified 06/02/2018 No Yes Inactive Problems Resolved Problems Electronic Signature(s) Signed: 08/04/2018 4:48:18 PM By: Worthy Keeler PA-C Entered By: Worthy Keeler on 08/04/2018 13:11:45 Hedges, [name] ([id number]) -------------------------------------------------------------------------------- Progress Note Details Patient Name: [name] Date of Service: 08/04/2018 12:30 PM Medical Record Number: [id number] Patient Account Number: [id number] Date of Birth/Sex: [birth year] (85 y.o. F) Treating RN: [name] Primary Care Provider: Thereasa Distance Other Clinician: Referring Provider: Thereasa Distance Treating Provider/Extender: Melburn Hake, HOYT Weeks in Treatment: 9 Subjective Chief Complaint Information obtained from Patient Right Lower leg Achilles ulcer History of Present Illness (HPI) 06/02/18 patient presents today for initial evaluation our clinic concerning the ulcers that she has on the right Achilles location. 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 She was subsequently supposed to have had a scheduled likely stent placement in December 2019 unfortunately she had a fall with a sub dural hematoma which subsequently had to be evacuated partially at least. For that reason she has never gotten back in with  Dr. Delana Meyer to have the vascular procedure. She does not have any appointment scheduled with him at this time. The patient does have a history of chronic kidney disease stage III, congestive heart failure, trophy relation, and at this point home health who has been coming out has been using [facility]. This is been since she's been home towards the end of December as best we can tell. She does have a hemoglobin A1c of 7.0 which was obtained on 11/18/17. Currently the patient's right lower extremity that showed diminished blood flow according to her arterial study which is dated 02/27/18. At that point her right ABI was 0.89 with a TBI of 0.36 and her left ABI was 1.13 with a TBI of 0.50. Her prior study which was in May on the 13th 2019 showed that she had a right ABI of one and a right TBI of 0.51 with a left ABI of 0.72 and a TBI of 0.50. Obviously since May till now her right leg has diminished as far as the blood flows concerned patient's son who was with her today states that Dr. 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 I do believe that for limb salvage this patient needs to have the above stated procedure with Dr. Delana Meyer as soon as possible. 06/09/18 on evaluation today patient actually appears to be doing about the same in regard to her Achilles ulcer. This actually does have tended exposed however the tenant is remaining moist which is good news that's exactly what we want to see. Fortunately there's no signs of infection she does have an appointment with Dr. Delana Meyer this coming Tuesday, June 13, 2018. 06/16/18 on evaluation today patient actually appears to be doing rather  well in regard to her right lower extremity at this point. The Achilles ulcer actually showed signs of being a little bit better as far as the overall appearance of the wound was concerned today. Fortunately there did not appear to be the evidence of infection at this time which is good news.  With that being said she did have her angiogram where she did actually have a significant blockage of the 90% into her lower extremity. Fortunately between the balloon angioplasty followed by stating this was able to be improved to less than 5% residual stenosis according to Dr. Nino Parsley note that I read. She still may have some limitation into her foot in particular as far as the runoff is concerned but nonetheless in general appears to be doing much better which is great news. Fortunately there is no sign of infection at this time. 06/23/18 on evaluation today patient's wound bed actually appears to be doing about the same although the central portion of the tendon actually appears to be somewhat dry this is definitely not good news. We want to try to keep this moist while allowing the edges of the wound hopefully start to cover the tendon that is the goal. Nonetheless I think that we may need to switch things up a little bit to try to improve the moisture trapping at this point. She does have better blood flow which is great we have applied for EpiFix although unfortunately as of right now this shows to be not covered. I think that we need to try to see about getting this approved as with the tenant expose me to do everything we can to try to get this covering of the skin as soon as possible. Again I'm not even sure that she'd be a candidate for plastic surgery intervention. 06/30/18 on evaluation today patient actually appears to be doing about the same in regard to her wound on the right Achilles region. The Achilles tendon is still exposed there does not appear to be any tissue growth around the edge while the tendon itself does not appear to be quite as dry as it was previous there's definitely necrosis of the tenant beginning to be noted unfortunately. For that reason I think that we really need to make an appointment for her to see a specialist in order to discuss the options with her in  regard to this one. Fortunately there does not appear to be any signs of systemic infection or even local [name], Kathleen J. ([id number]) infection for that matter. 07/07/18 on evaluation today patient appears to be doing about the same in regard to her Achilles region also. There does not appear to be any signs of active infection at this time which is good news. With that being said her Achilles tendon is very dry and appears to be more so due to the fact that apparently the last dressing that was put on was used hydrogel with a Dry gauze of the top not what had been previously recommended. For that reason again the tendon does appear to be quite a bit more dry than what it was previous unfortunately. They still have not heard from orthopedics is for the referral for her Achilles region. We had made this referral last week on Tuesday the sign still hadn't heard anything and the office told him that they did not have a referral from [place] we therefore reset the referral then he still has not heard anything. 07/14/18 on evaluation today patient appears to be doing  about the same in regard to her right Achilles ulcer. She has been tolerating the dressing changes without complication. With that being said nothing seems to really improve significantly in my pinion. She did see the orthopedic doctor earlier this week they recommended a referral to Dr. Vickki Muff her podiatrist for further evaluation as well as a potential referral to plastic surgery although that is not scheduled as of yet. The patient is scheduled to see her podiatrist on Monday. There's no signs of active infection at this time. 07/21/18 on evaluation today patient was actually seen on 07/17/18 by Dr. Vickki Muff who is a local podiatrist. Subsequently he did sharply debride in the office the wound to some degree to try to clear off the eschar and see how much of the tenant underlying was actually damaged. He however felt that with the patient really needed  was more significant debridement of the Achilles tendon to clear this away and that a Wound VAC following. However he considers this a semi-elective surgery which subsequently the hospitals not allowing at this point. Nonetheless the patient son was present during the evaluation that she had there with the podiatrist and did we count that exactly what I was saying was exactly what he remembered as well. No fevers, chills, nausea, or vomiting noted at this time. 07/28/18 on evaluation today patient appears to be doing a little better in regard to the necrotic tendon loosen up on the surface of her wound. She's been tolerating the dressing changes without complication. That does not appear to be any signs of active infection at this time which is good news. I'll be said that something we're trying to avoid. She does see Dr. Vickki Muff on Monday and just a couple of days. 08/04/18 on evaluation today patient appears to be doing about the same in regard to her wound on the posterior Achilles region. The tendon is still softening up but does not appear to show any signs of infection at this time. She did see her podiatrist on Monday he still feels like she needs surgery to remove the tendon in its entirety although right now again still with the hospital lockdown there's no indication that this can be done. Again this is due to the nature of surgeries that are allowed and hers apparently does not fall in the category of essential. Nonetheless I think that this is something that needs to be done sooner rather than later based on what I'm seeing. Patient History Information obtained from Patient. Family History Cancer - Siblings, Heart Disease - Mother,Siblings,Father, Hypertension - Mother,Father,Siblings, Stroke - Father, No family history of Diabetes, Hereditary Spherocytosis, Kidney Disease, Lung Disease, Seizures, Thyroid Problems, Tuberculosis. Social History Never smoker, Marital Status - Married,  Alcohol Use - Never, Drug Use - No History, Caffeine Use - Daily. Medical History Eyes Denies history of Cataracts, Glaucoma Ear/Nose/Mouth/Throat Denies history of Chronic sinus problems/congestion, Middle ear problems Hematologic/Lymphatic Patient has history of Anemia Denies history of Hemophilia, Human Immunodeficiency Virus, Lymphedema, Sickle Cell Disease Respiratory Denies history of Aspiration, Asthma, Chronic Obstructive Pulmonary Disease (COPD), Pneumothorax, Sleep Apnea, Tuberculosis Cardiovascular Mcdermott, [place] J. ([id number]) Patient has history of Arrhythmia - a fib, Congestive Heart Failure, Hypertension Denies history of Angina, Coronary Artery Disease, Deep Vein Thrombosis, Hypotension, Myocardial Infarction, Peripheral Arterial Disease, Peripheral Venous Disease, Phlebitis, Vasculitis Gastrointestinal Denies history of Cirrhosis , Colitis, Crohn s, Hepatitis A, Hepatitis B, Hepatitis C Endocrine Patient has history of Type II Diabetes Denies history of Type I Diabetes Genitourinary Patient has  history of End Stage Renal Disease - CKD stage 3 Immunological Denies history of Lupus Erythematosus, Raynaud s, Scleroderma Integumentary (Skin) Denies history of History of Burn, History of pressure wounds Musculoskeletal Patient has history of Osteoarthritis Denies history of Gout, Rheumatoid Arthritis, Osteomyelitis Neurologic Denies history of Dementia, Neuropathy, Quadriplegia, Paraplegia, Seizure Disorder Oncologic Denies history of Received Chemotherapy, Received Radiation Psychiatric Denies history of Anorexia/bulimia, Confinement Anxiety Medical And Surgical History Notes Cardiovascular HLD, mitral insufficiency Musculoskeletal 4 broken ribs Review of Systems (ROS) Constitutional Symptoms (General Health) Denies complaints or symptoms of Fatigue, Fever, Chills, Marked Weight Change. Respiratory Denies complaints or symptoms of Chronic or frequent coughs,  Shortness of Breath. Cardiovascular Denies complaints or symptoms of Chest pain, LE edema. Psychiatric Denies complaints or symptoms of Anxiety, Claustrophobia. Objective Constitutional Well-nourished and well-hydrated in no acute distress. Vitals Time Taken: 1:16 PM, Height: 63 in, Weight: 102 lbs, BMI: 18.1, Temperature: 98.2 F, Pulse: 82 bpm, Respiratory Rate: 16 breaths/min, Blood Pressure: 157/77 mmHg. Respiratory normal breathing without difficulty. clear to auscultation bilaterally. PHILIPPA, VESSEY. ([id number]) Cardiovascular regular rate and rhythm with normal S1, S2. Psychiatric this patient is able to make decisions and demonstrates good insight into disease process. Alert and Oriented x 3. pleasant and cooperative. General Notes: Patient's wound bed currently showed signs of necrotic tendon still and though this seems to be softening up to some degree there does not appear to be any evidence of active infection at this time. Integumentary (Hair, Skin) Wound #1 status is Open. Original cause of wound was Gradually Appeared. The wound is located on the Right Achilles. The wound measures 4.5cm length x 2.5cm width x 0.1cm depth; 8.836cm^2 area and 0.884cm^3 volume. There is tendon and Fat Layer (Subcutaneous Tissue) Exposed exposed. There is no tunneling or undermining noted. There is a medium amount of serous drainage noted. The wound margin is flat and intact. There is no granulation within the wound bed. There is a large (67-100%) amount of necrotic tissue within the wound bed including Eschar and Adherent Slough. The periwound skin appearance exhibited: Rubor. The periwound skin appearance did not exhibit: Callus, Crepitus, Excoriation, Induration, Rash, Scarring, Dry/Scaly, Maceration, Atrophie Blanche, Cyanosis, Ecchymosis, Hemosiderin Staining, Mottled, Pallor, Erythema. Periwound temperature was noted as No Abnormality. The periwound has tenderness on  palpation. 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 There was a Chemical/Enzymatic/Mechanical debridement performed by [name], RN. With the following instrument(s): tongue blade after achieving pain control using Lidocaine 4% Topical Solution. Agent used was [employer]. A time out was conducted at 13:51, prior to the start of the procedure. There was no bleeding. The procedure was tolerated well with a pain level of 0 throughout and a pain level of 0 following the procedure. Post Debridement Measurements: 4.5cm length x 2.5cm width x 0.1cm depth; 0.884cm^3 volume. Character of Wound/Ulcer Post Debridement is improved. Severity of Tissue Post Debridement is: Necrosis of muscle. Post procedure Diagnosis Wound #1: Same as Pre-Procedure Plan [name], Kathleen J. ([id number]) Wound Cleansing: Wound #1 Right Achilles: Clean wound with Normal Saline. Primary Wound Dressing: Wound #1 Right Achilles: Santyl Ointment - to wound bed - HHRN to do this as well please Xeroform - over santyl to  trap moisture on tendon Secondary Dressing: Wound #1 Right Achilles: ABD and Kerlix/Conform Dressing Change Frequency: Wound #1 Right Achilles: Change dressing every day. Follow-up Appointments: Wound #1 Right Achilles: Return Appointment in 1 week. Home Health: Wound #1 Right Achilles: Golden Nurse may visit PRN to address patient s wound care needs. FACE TO FACE ENCOUNTER: MEDICARE and MEDICAID PATIENTS: I certify that this patient is under my care and that I had a face-to-face  encounter that meets the physician face-to-face encounter requirements with this patient on this date. The encounter with the patient was in whole or in part for the following MEDICAL CONDITION: (primary reason for [place]) MEDICAL NECESSITY: I certify, that based on my findings, NURSING services are a medically necessary home health service. HOME BOUND STATUS: I certify that my clinical findings support that this patient is homebound (i.e., Due to illness or injury, pt requires aid of supportive devices such as crutches, cane, wheelchairs, walkers, the use of special transportation or the assistance of another person to leave their place of residence. There is a normal inability to leave the home and doing so requires considerable and taxing effort. Other absences are for medical reasons / religious services and are infrequent or of short duration when for other reasons). If current dressing causes regression in wound condition, may D/C ordered dressing product/s and apply Normal Saline Moist Dressing daily until next [facility] / Other MD appointment. [place] of regression in wound condition at [phone]. Please direct any NON-WOUND related issues/requests for orders to patient's Primary Care Physician Medications-please add to medication list.: Wound #1 Right Achilles: Santyl Enzymatic Ointment My suggestion at this point is gonna be that we go ahead and initiate the above wound care measures for the next week. The patient is in agreement with plan. If anything changes or worsens meantime she will let me know her son was present during the evaluation today. My hope is that things will continue to be under control and infection free until she can have her surgery I'm gonna see her weekly to ensure there's no evidence of infection ensuing in the meantime. Please see above for specific wound care orders. We will see patient for re-evaluation in 1 week(s)  here in the clinic. If anything worsens or changes patient will contact our office for additional recommendations. Electronic Signature(s) Signed: 08/04/2018 4:48:18 PM By: Worthy Keeler PA-C Entered By: Worthy Keeler on 08/04/2018 14:31:53 [name], [name] ([id number]) -------------------------------------------------------------------------------- ROS/PFSH Details Patient Name: [name] Date of Service: 08/04/2018 12:30 PM Medical Record Number: [id number] Patient Account Number: [id number] Date of Birth/Sex: [birth year] (85 y.o. F) Treating RN: [name] Primary Care Provider: Thereasa Distance Other Clinician: Referring Provider: Thereasa Distance Treating Provider/Extender: Melburn Hake, HOYT Weeks in Treatment: 9 Information Obtained From Patient Constitutional Symptoms (General Health) Complaints and Symptoms: Negative for: Fatigue; Fever; Chills; Marked Weight Change Respiratory Complaints and Symptoms: Negative for: Chronic or frequent coughs; Shortness of Breath Medical History: Negative for: Aspiration; Asthma; Chronic Obstructive Pulmonary Disease (COPD); Pneumothorax; Sleep Apnea; Tuberculosis Cardiovascular Complaints and Symptoms: Negative for: Chest pain; LE edema Medical History: Positive for: Arrhythmia - a fib; Congestive Heart Failure; Hypertension Negative for: Angina; Coronary Artery Disease; Deep Vein Thrombosis; Hypotension; Myocardial Infarction; Peripheral Arterial Disease; Peripheral Venous Disease; Phlebitis; Vasculitis Past Medical History Notes: HLD, mitral insufficiency Psychiatric Complaints and Symptoms: Negative for: Anxiety; Claustrophobia Medical History: Negative for: Anorexia/bulimia; Confinement Anxiety Eyes Medical History: Negative for: Cataracts;  Glaucoma Ear/Nose/Mouth/Throat Medical History: Negative for: Chronic sinus problems/congestion; Middle ear problems Hematologic/Lymphatic Medical History: Positive for:  Anemia [name], Kathleen J. ([id number]) Negative for: Hemophilia; Human Immunodeficiency Virus; Lymphedema; Sickle Cell Disease Gastrointestinal Medical History: Negative for: Cirrhosis ; Colitis; Crohnos; Hepatitis A; Hepatitis B; Hepatitis C Endocrine Medical History: Positive for: Type II Diabetes Negative for: Type I Diabetes Treated with: Insulin Blood sugar tested every day: Yes Tested : Genitourinary Medical History: Positive for: End Stage Renal Disease - CKD stage 3 Immunological Medical History: Negative for: Lupus Erythematosus; Raynaudos; Scleroderma Integumentary (Skin) Medical History: Negative for: History of Burn; History of pressure wounds Musculoskeletal Medical History: Positive for: Osteoarthritis Negative for: Gout; Rheumatoid Arthritis; Osteomyelitis Past Medical History Notes: 4 broken ribs Neurologic Medical History: Negative for: Dementia; Neuropathy; Quadriplegia; Paraplegia; Seizure Disorder Oncologic Medical History: Negative for: Received Chemotherapy; Received Radiation Immunizations Pneumococcal Vaccine: Received Pneumococcal Vaccination: Yes Immunization Notes: up to date Implantable Devices No devices added Family and Social History [name], Kathleen VIDANA. ([id number]) Cancer: Yes - Siblings; Diabetes: No; Heart Disease: Yes - Mother,Siblings,Father; Hereditary Spherocytosis: No; Hypertension: Yes - Mother,Father,Siblings; Kidney Disease: No; Lung Disease: No; Seizures: No; Stroke: Yes - Father; Thyroid Problems: No; Tuberculosis: No; Never smoker; Marital Status - Married; Alcohol Use: Never; Drug Use: No History; Caffeine Use: Daily; Financial Concerns: No; Food, Clothing or Shelter Needs: No; Support System Lacking: No; Transportation Concerns: No Physician Affirmation I have reviewed and agree with the above information. Electronic Signature(s) Signed: 08/04/2018 4:48:18 PM By: Worthy Keeler PA-C Signed: 08/04/2018 4:49:53 PM By: [name] Entered By: Worthy Keeler on 08/04/2018 13:42:46 [name], [name] ([id number]) -------------------------------------------------------------------------------- SuperBill Details Patient Name: [name] Date of Service: 08/04/2018 Medical Record Number: [id number] Patient Account Number: [id number] Date of Birth/Sex: December 12, 1925 (85 y.o. F) Treating RN: [name] Primary Care Provider: Thereasa Distance Other Clinician: Referring Provider: Thereasa Distance Treating Provider/Extender: Melburn Hake, HOYT Weeks in Treatment: 9 Diagnosis Coding ICD-10 Codes Code Description I73.89 Other specified peripheral vascular diseases L97.818 Non-pressure chronic ulcer of other part of right lower leg with other specified severity N18.3 Chronic kidney disease, stage 3 (moderate) I48.20 Chronic atrial fibrillation, unspecified Facility Procedures CPT4 Code: [id number] Description: 16073 - [facility] W/O ANES NON SELECT Modifier: Quantity: 1 Physician Procedures CPT4 Code Description: 7106269 [id number] - WC PHYS LEVEL 4 - EST PT ICD-10 Diagnosis Description I73.89 Other specified peripheral vascular diseases L97.818 Non-pressure chronic ulcer of other part of right lower leg wit N18.3 Chronic kidney disease, stage 3  (moderate) I48.20 Chronic atrial fibrillation, unspecified Modifier: h other specified Quantity: 1 severity Electronic Signature(s) Signed: 08/04/2018 4:48:18 PM By: Worthy Keeler PA-C Entered By: Worthy Keeler on 08/04/2018 14:33:38

## 2018-08-11 ENCOUNTER — Other Ambulatory Visit: Payer: Self-pay

## 2018-08-11 ENCOUNTER — Other Ambulatory Visit
Admission: RE | Admit: 2018-08-11 | Discharge: 2018-08-11 | Disposition: A | Discharge status: Home / Self Care | Payer: Medicare Other | Source: Ambulatory Visit | Attending: Physician Assistant | Admitting: Physician Assistant

## 2018-08-11 ENCOUNTER — Encounter: Payer: Medicare Other | Attending: Physician Assistant | Admitting: Physician Assistant

## 2018-08-11 DIAGNOSIS — E1122 Type 2 diabetes mellitus with diabetic chronic kidney disease: Secondary | ICD-10-CM | POA: Insufficient documentation

## 2018-08-11 DIAGNOSIS — E11622 Type 2 diabetes mellitus with other skin ulcer: Secondary | ICD-10-CM | POA: Diagnosis present

## 2018-08-11 DIAGNOSIS — M7661 Achilles tendinitis, right leg: Secondary | ICD-10-CM | POA: Insufficient documentation

## 2018-08-11 DIAGNOSIS — L97813 Non-pressure chronic ulcer of other part of right lower leg with necrosis of muscle: Secondary | ICD-10-CM | POA: Insufficient documentation

## 2018-08-11 DIAGNOSIS — N183 Chronic kidney disease, stage 3 (moderate): Secondary | ICD-10-CM | POA: Diagnosis not present

## 2018-08-11 DIAGNOSIS — E1151 Type 2 diabetes mellitus with diabetic peripheral angiopathy without gangrene: Secondary | ICD-10-CM | POA: Diagnosis not present

## 2018-08-11 NOTE — Progress Notes (Signed)
[name], [name] ([id number]) Visit Report for 08/11/2018 Chief Complaint Document Details Patient Name: [name], [name] Date of Service: 08/11/2018 1:30 PM Medical Record Number: [id number] Patient Account Number: [id number] Date of Birth/Sex: September 23, 1925 (85 y.o. F) Treating RN: [name] Primary Care Provider: Thereasa Distance Other Clinician: Referring Provider: Thereasa Distance Treating Provider/Extender: Melburn Hake, Shaneil Yazdi Weeks in Treatment: 10 Information Obtained from: Patient Chief Complaint Right Lower leg Achilles ulcer Electronic Signature(s) Signed: 08/11/2018 4:18:17 PM By: Worthy Keeler PA-C Entered By: Worthy Keeler on 08/11/2018 13:31:48 [name], [name] ([id number]) -------------------------------------------------------------------------------- Debridement Details Patient Name: [name] Date of Service: 08/11/2018 1:30 PM Medical Record Number: [id number] Patient Account Number: [id number] Date of Birth/Sex: Sep 02, 1925 (85 y.o. F) Treating RN: [name] Primary Care Provider: Thereasa Distance Other Clinician: Referring Provider: Thereasa Distance Treating Provider/Extender: Melburn Hake, Lougenia Morrissey Weeks in Treatment: 10 Debridement Performed for Wound #1 Right Achilles Assessment: Performed By: Clinician [name], RN Debridement Type: Chemical/Enzymatic/Mechanical Agent Used: Santyl Severity of Tissue Pre Necrosis of muscle Debridement: Level of Consciousness (Pre- Awake and Alert procedure): Pre-procedure Verification/Time Yes - 14:06 Out Taken: Start Time: 14:06 Pain Control: Lidocaine 4% Topical Solution Instrument: Other : tongue depressor Bleeding: None End Time: 14:07 Procedural Pain: 0 Post Procedural Pain: 0 Response to Treatment: Procedure was tolerated well Level of Consciousness Awake and Alert (Post-procedure): Post Debridement Measurements of Total Wound Length: (cm) 4.5 Width: (cm) 2.5 Depth: (cm) 0.1 Volume: (cm)  0.884 Character of Wound/Ulcer Post Debridement: Requires Further Debridement Severity of Tissue Post Debridement: Necrosis of muscle Post Procedure Diagnosis Same as Pre-procedure Electronic Signature(s) Signed: 08/11/2018 2:28:45 PM By: [name] Signed: 08/11/2018 4:18:17 PM By: Worthy Keeler PA-C Entered By: [name] on 08/11/2018 14:28:44 [name], [name] ([id number]) -------------------------------------------------------------------------------- HPI Details Patient Name: [name] Date of Service: 08/11/2018 1:30 PM Medical Record Number: [id number] Patient Account Number: [id number] Date of Birth/Sex: [birth year] (85 y.o. F) Treating RN: [name] Primary Care Provider: Thereasa Distance Other Clinician: Referring Provider: Thereasa Distance Treating Provider/Extender: Melburn Hake, Kassadee Carawan Weeks in Treatment: 10 History of Present Illness HPI Description: 06/02/18 patient presents today for initial evaluation our clinic concerning the ulcers that she has on the right Achilles location. 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 She was subsequently supposed to have had a scheduled likely stent placement in December 2019 unfortunately she had a fall with a sub dural hematoma which subsequently had to be evacuated partially at least. For that reason she has never gotten back in with Dr. Delana Meyer to have the vascular procedure. She does not have any appointment scheduled with him at this time. The patient does have a history of chronic kidney disease stage III, congestive heart failure, trophy relation, and at this point home health who has been coming out has been using [facility]. This is been since she's been home towards the end of December as best we can tell. She does have a hemoglobin A1c of 7.0 which was obtained on 11/18/17. Currently the patient's right lower extremity that showed diminished blood flow  according to her arterial study which is dated 02/27/18. At that point her right ABI was 0.89 with a TBI of 0.36 and her left ABI was 1.13 with a TBI of 0.50. Her prior study which was in May on the 13th 2019 showed that she had a right ABI of one and a right TBI of 0.51 with a left ABI of 0.72  and a TBI of 0.50. Obviously since May till now her right leg has diminished as far as the blood flows concerned patient's son who was with her today states that Dr. 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 I do believe that for limb salvage this patient needs to have the above stated procedure with Dr. Delana Meyer as soon as possible. 06/09/18 on evaluation today patient actually appears to be doing about the same in regard to her Achilles ulcer. This actually does have tended exposed however the tenant is remaining moist which is good news that's exactly what we want to see. Fortunately there's no signs of infection she does have an appointment with Dr. Delana Meyer this coming Tuesday, June 13, 2018. 06/16/18 on evaluation today patient actually appears to be doing rather well in regard to her right lower extremity at this point. The Achilles ulcer actually showed signs of being a little bit better as far as the overall appearance of the wound was concerned today. Fortunately there did not appear to be the evidence of infection at this time which is good news. With that being said she did have her angiogram where she did actually have a significant blockage of the 90% into her lower extremity. Fortunately between the balloon angioplasty followed by stating this was able to be improved to less than 5% residual stenosis according to Dr. Nino Parsley note that I read. She still may have some limitation into her foot in particular as far as the runoff is concerned but nonetheless in general appears to be doing much better which is great news. Fortunately there is no sign of infection at this  time. 06/23/18 on evaluation today patient's wound bed actually appears to be doing about the same although the central portion of the tendon actually appears to be somewhat dry this is definitely not good news. We want to try to keep this moist while allowing the edges of the wound hopefully start to cover the tendon that is the goal. Nonetheless I think that we may need to switch things up a little bit to try to improve the moisture trapping at this point. She does have better blood flow which is great we have applied for EpiFix although unfortunately as of right now this shows to be not covered. I think that we need to try to see about getting this approved as with the tenant expose me to do everything we can to try to get this covering of the skin as soon as possible. Again I'm not even sure that she'd be a candidate for plastic surgery intervention. 06/30/18 on evaluation today patient actually appears to be doing about the same in regard to her wound on the right Achilles region. The Achilles tendon is still exposed there does not appear to be any tissue growth around the edge while the tendon itself does not appear to be quite as dry as it was previous there's definitely necrosis of the tenant beginning to be noted unfortunately. For that reason I think that we really need to make an appointment for her to see a specialist in order to discuss the options with her in regard to this one. Fortunately there does not appear to be any signs of systemic infection or even local infection for that matter. 07/07/18 on evaluation today patient appears to be doing about the same in regard to her Achilles region also. There does not appear to be any signs of active infection at this time  which is good news. With that being said her Achilles tendon is very dry and appears to be more so due to the fact that apparently the last dressing that was put on was used hydrogel with a Dry gauze of the top not what had  been previously recommended. For that reason again the tendon does appear to be quite a bit Godinho, Estrellita J. ([id number]) more dry than what it was previous unfortunately. They still have not heard from orthopedics is for the referral for her Achilles region. We had made this referral last week on Tuesday the sign still hadn't heard anything and the office told him that they did not have a referral from [place] we therefore reset the referral then he still has not heard anything. 07/14/18 on evaluation today patient appears to be doing about the same in regard to her right Achilles ulcer. She has been tolerating the dressing changes without complication. With that being said nothing seems to really improve significantly in my pinion. She did see the orthopedic doctor earlier this week they recommended a referral to Dr. Vickki Muff her podiatrist for further evaluation as well as a potential referral to plastic surgery although that is not scheduled as of yet. The patient is scheduled to see her podiatrist on Monday. There's no signs of active infection at this time. 07/21/18 on evaluation today patient was actually seen on 07/17/18 by Dr. Vickki Muff who is a local podiatrist. Subsequently he did sharply debride in the office the wound to some degree to try to clear off the eschar and see how much of the tenant underlying was actually damaged. He however felt that with the patient really needed was more significant debridement of the Achilles tendon to clear this away and that a Wound VAC following. However he considers this a semi-elective surgery which subsequently the hospitals not allowing at this point. Nonetheless the patient son was present during the evaluation that she had there with the podiatrist and did we count that exactly what I was saying was exactly what he remembered as well. No fevers, chills, nausea, or vomiting noted at this time. 07/28/18 on evaluation today patient appears to be doing a little better  in regard to the necrotic tendon loosen up on the surface of her wound. She's been tolerating the dressing changes without complication. That does not appear to be any signs of active infection at this time which is good news. I'll be said that something we're trying to avoid. She does see Dr. Vickki Muff on Monday and just a couple of days. 08/04/18 on evaluation today patient appears to be doing about the same in regard to her wound on the posterior Achilles region. The tendon is still softening up but does not appear to show any signs of infection at this time. She did see her podiatrist on Monday he still feels like she needs surgery to remove the tendon in its entirety although right now again still with the hospital lockdown there's no indication that this can be done. Again this is due to the nature of surgeries that are allowed and hers apparently does not fall in the category of essential. Nonetheless I think that this is something that needs to be done sooner rather than later based on what I'm seeing. 08/11/18 on evaluation today patient appears to be doing about the same that she does have some erythema around the edge of the wound. This is coupled with the fact she's had more drainage as well as increased  pain all of which combine to make me concerned about the possibility of infection. Fortunately there's no signs of systemic infection. No fevers, chills, nausea, or vomiting noted at this time. Electronic Signature(s) Signed: 08/11/2018 4:18:17 PM By: Worthy Keeler PA-C Entered By: Worthy Keeler on 08/11/2018 14:08:39 [name], [name] ([id number]) -------------------------------------------------------------------------------- Physical Exam Details Patient Name: [name] Date of Service: 08/11/2018 1:30 PM Medical Record Number: [id number] Patient Account Number: [id number] Date of Birth/Sex: June 27, 1925 (85 y.o. F) Treating RN: [name] Primary Care Provider: Thereasa Distance  Other Clinician: Referring Provider: Thereasa Distance Treating Provider/Extender: Melburn Hake, Zamaya Rapaport Weeks in Treatment: 61 Constitutional Well-nourished and well-hydrated in no acute distress. Respiratory normal breathing without difficulty. clear to auscultation bilaterally. Cardiovascular regular rate and rhythm with normal S1, S2. Psychiatric this patient is able to make decisions and demonstrates good insight into disease process. Alert and Oriented x 3. pleasant and cooperative. Notes Patient's wound bed currently did show evidence of necrotic tissue including necrotic tendon on the surface of the wound at this point still. She does have some granulation around the edge although again she really needs surgical debridement to clear away the tendon in general as a whole and then likely a Wound VAC following. This is what her podiatrist has recommended that again because of the Covid-19 Virus pandemic he has not been able to get her scheduled for this currently. Electronic Signature(s) Signed: 08/11/2018 4:18:17 PM By: Worthy Keeler PA-C Entered By: Worthy Keeler on 08/11/2018 14:09:22 [name], [name] ([id number]) -------------------------------------------------------------------------------- Physician Orders Details Patient Name: [name] Date of Service: 08/11/2018 1:30 PM Medical Record Number: [id number] Patient Account Number: [id number] Date of Birth/Sex: [birth year] (85 y.o. F) Treating RN: [name] Primary Care Provider: Thereasa Distance Other Clinician: Referring Provider: Thereasa Distance Treating Provider/Extender: Melburn Hake, Kleber Crean Weeks in Treatment: 10 Verbal / Phone Orders: No Diagnosis Coding ICD-10 Coding Code Description I73.89 Other specified peripheral vascular diseases L97.818 Non-pressure chronic ulcer of other part of right lower leg with other specified severity N18.3 Chronic kidney disease, stage 3 (moderate) I48.20 Chronic atrial fibrillation,  unspecified Wound Cleansing Wound #1 Right Achilles o Clean wound with Normal Saline. Primary Wound Dressing Wound #1 Right Achilles o Santyl Ointment - to wound bed - HHRN to do this as well please o Xeroform - over santyl to trap moisture on tendon Secondary Dressing Wound #1 Right Achilles o ABD and Kerlix/Conform Dressing Change Frequency Wound #1 Right Achilles o Change dressing every day. Follow-up Appointments Wound #1 Right Achilles o Return Appointment in 1 week. Home Health Wound #1 Right Achilles o Continue Home Health Visits o Home Health Nurse may visit PRN to address patientos wound care needs. o FACE TO FACE ENCOUNTER: MEDICARE and MEDICAID PATIENTS: I certify that this patient is under my care and that I had a face-to-face encounter that meets the physician face-to-face encounter requirements with this patient on this date. The encounter with the patient was in whole or in part for the following MEDICAL CONDITION: (primary reason for [place]) MEDICAL NECESSITY: I certify, that based on my findings, NURSING services are a medically necessary home health service. HOME BOUND STATUS: I certify that my clinical findings support that this patient is homebound (i.e., Due to illness or injury, pt requires aid of supportive devices such as crutches, cane, wheelchairs, walkers, the use of special transportation or the assistance of another person to leave their place of residence. There is a normal inability to leave the  home and doing so requires considerable and taxing effort. Other absences are for medical reasons / religious services and are infrequent or of short duration when for other reasons). [name], [name] ([id number]) o If current dressing causes regression in wound condition, may D/C ordered dressing product/s and apply Normal Saline Moist Dressing daily until next [place] / Other MD appointment. [place]  of regression in wound condition at [phone]. o Please direct any NON-WOUND related issues/requests for orders to patient's Primary Care Physician Medications-please add to medication list. Wound #1 Right Achilles o Santyl Enzymatic Ointment Laboratory o Bacteria identified in Wound by Culture (MICRO) oooo LOINC Code: 5885-0 oooo Convenience Name: Wound culture routine Patient Medications Allergies: Sulfa (Sulfonamide Antibiotics) Notifications Medication Indication Start End doxycycline hyclate 08/11/2018 DOSE 1 - oral 100 mg capsule - 1 capsule oral taken 2 times a day for 14 days. Do not take calcium and vitamins with this medication. Electronic Signature(s) Signed: 08/11/2018 2:10:58 PM By: Worthy Keeler PA-C Entered By: Worthy Keeler on 08/11/2018 14:10:57 [name], [name] ([id number]) -------------------------------------------------------------------------------- Problem List Details Patient Name: [name] Date of Service: 08/11/2018 1:30 PM Medical Record Number: [id number] Patient Account Number: [id number] Date of Birth/Sex: Apr 26, 1925 (85 y.o. F) Treating RN: [name] Primary Care Provider: Thereasa Distance Other Clinician: Referring Provider: Thereasa Distance Treating Provider/Extender: Melburn Hake, Estee Yohe Weeks in Treatment: 10 Active Problems ICD-10 Evaluated Encounter Code Description Active Date Today Diagnosis I73.89 Other specified peripheral vascular diseases 06/02/2018 No Yes L97.818 Non-pressure chronic ulcer of other part of right lower leg 06/02/2018 No Yes with other specified severity N18.3 Chronic kidney disease, stage 3 (moderate) 06/02/2018 No Yes I48.20 Chronic atrial fibrillation, unspecified 06/02/2018 No Yes Inactive Problems Resolved Problems Electronic Signature(s) Signed: 08/11/2018 4:18:17 PM By: Worthy Keeler PA-C Entered By: Worthy Keeler on 08/11/2018 13:31:43 [name], [name]  ([id number]) -------------------------------------------------------------------------------- Progress Note Details Patient Name: [name] Date of Service: 08/11/2018 1:30 PM Medical Record Number: [id number] Patient Account Number: [id number] Date of Birth/Sex: 10-Feb-1926 (85 y.o. F) Treating RN: [name] Primary Care Provider: Thereasa Distance Other Clinician: Referring Provider: Thereasa Distance Treating Provider/Extender: Melburn Hake, Abdirizak Richison Weeks in Treatment: 10 Subjective Chief Complaint Information obtained from Patient Right Lower leg Achilles ulcer History of Present Illness (HPI) 06/02/18 patient presents today for initial evaluation our clinic concerning the ulcers that she has on the right Achilles location. 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 She was subsequently supposed to have had a scheduled likely stent placement in December 2019 unfortunately she had a fall with a sub dural hematoma which subsequently had to be evacuated partially at least. For that reason she has never gotten back in with Dr. Delana Meyer to have the vascular procedure. She does not have any appointment scheduled with him at this time. The patient does have a history of chronic kidney disease stage III, congestive heart failure, trophy relation, and at this point home health who has been coming out has been using [facility]. This is been since she's been home towards the end of December as best we can tell. She does have a hemoglobin A1c of 7.0 which was obtained on 11/18/17. Currently the patient's right lower extremity that showed diminished blood flow according to her arterial study which is dated 02/27/18. At that point her right ABI was 0.89 with a TBI of 0.36 and her left ABI was 1.13 with a TBI of  0.50. Her prior study which was in May on the 13th 2019 showed that she had a right ABI of one and a right TBI of 0.51 with a left ABI of  0.72 and a TBI of 0.50. Obviously since May till now her right leg has diminished as far as the blood flows concerned patient's son who was with her today states that Dr. 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 I do believe that for limb salvage this patient needs to have the above stated procedure with Dr. Delana Meyer as soon as possible. 06/09/18 on evaluation today patient actually appears to be doing about the same in regard to her Achilles ulcer. This actually does have tended exposed however the tenant is remaining moist which is good news that's exactly what we want to see. Fortunately there's no signs of infection she does have an appointment with Dr. Delana Meyer this coming Tuesday, June 13, 2018. 06/16/18 on evaluation today patient actually appears to be doing rather well in regard to her right lower extremity at this point. The Achilles ulcer actually showed signs of being a little bit better as far as the overall appearance of the wound was concerned today. Fortunately there did not appear to be the evidence of infection at this time which is good news. With that being said she did have her angiogram where she did actually have a significant blockage of the 90% into her lower extremity. Fortunately between the balloon angioplasty followed by stating this was able to be improved to less than 5% residual stenosis according to Dr. Nino Parsley note that I read. She still may have some limitation into her foot in particular as far as the runoff is concerned but nonetheless in general appears to be doing much better which is great news. Fortunately there is no sign of infection at this time. 06/23/18 on evaluation today patient's wound bed actually appears to be doing about the same although the central portion of the tendon actually appears to be somewhat dry this is definitely not good news. We want to try to keep this moist while allowing the edges of the wound hopefully  start to cover the tendon that is the goal. Nonetheless I think that we may need to switch things up a little bit to try to improve the moisture trapping at this point. She does have better blood flow which is great we have applied for EpiFix although unfortunately as of right now this shows to be not covered. I think that we need to try to see about getting this approved as with the tenant expose me to do everything we can to try to get this covering of the skin as soon as possible. Again I'm not even sure that she'd be a candidate for plastic surgery intervention. 06/30/18 on evaluation today patient actually appears to be doing about the same in regard to her wound on the right Achilles region. The Achilles tendon is still exposed there does not appear to be any tissue growth around the edge while the tendon itself does not appear to be quite as dry as it was previous there's definitely necrosis of the tenant beginning to be noted unfortunately. For that reason I think that we really need to make an appointment for her to see a specialist in order to discuss the options with her in regard to this one. Fortunately there does not appear to be any signs of systemic infection or even local [name], Kathleen J. (  [id number]) infection for that matter. 07/07/18 on evaluation today patient appears to be doing about the same in regard to her Achilles region also. There does not appear to be any signs of active infection at this time which is good news. With that being said her Achilles tendon is very dry and appears to be more so due to the fact that apparently the last dressing that was put on was used hydrogel with a Dry gauze of the top not what had been previously recommended. For that reason again the tendon does appear to be quite a bit more dry than what it was previous unfortunately. They still have not heard from orthopedics is for the referral for her Achilles region. We had made this referral last week  on Tuesday the sign still hadn't heard anything and the office told him that they did not have a referral from [place] we therefore reset the referral then he still has not heard anything. 07/14/18 on evaluation today patient appears to be doing about the same in regard to her right Achilles ulcer. She has been tolerating the dressing changes without complication. With that being said nothing seems to really improve significantly in my pinion. She did see the orthopedic doctor earlier this week they recommended a referral to Dr. Vickki Muff her podiatrist for further evaluation as well as a potential referral to plastic surgery although that is not scheduled as of yet. The patient is scheduled to see her podiatrist on Monday. There's no signs of active infection at this time. 07/21/18 on evaluation today patient was actually seen on 07/17/18 by Dr. Vickki Muff who is a local podiatrist. Subsequently he did sharply debride in the office the wound to some degree to try to clear off the eschar and see how much of the tenant underlying was actually damaged. He however felt that with the patient really needed was more significant debridement of the Achilles tendon to clear this away and that a Wound VAC following. However he considers this a semi-elective surgery which subsequently the hospitals not allowing at this point. Nonetheless the patient son was present during the evaluation that she had there with the podiatrist and did we count that exactly what I was saying was exactly what he remembered as well. No fevers, chills, nausea, or vomiting noted at this time. 07/28/18 on evaluation today patient appears to be doing a little better in regard to the necrotic tendon loosen up on the surface of her wound. She's been tolerating the dressing changes without complication. That does not appear to be any signs of active infection at this time which is good news. I'll be said that something we're trying to avoid. She does see  Dr. Vickki Muff on Monday and just a couple of days. 08/04/18 on evaluation today patient appears to be doing about the same in regard to her wound on the posterior Achilles region. The tendon is still softening up but does not appear to show any signs of infection at this time. She did see her podiatrist on Monday he still feels like she needs surgery to remove the tendon in its entirety although right now again still with the hospital lockdown there's no indication that this can be done. Again this is due to the nature of surgeries that are allowed and hers apparently does not fall in the category of essential. Nonetheless I think that this is something that needs to be done sooner rather than later based on what I'm seeing. 08/11/18 on evaluation today  patient appears to be doing about the same that she does have some erythema around the edge of the wound. This is coupled with the fact she's had more drainage as well as increased pain all of which combine to make me concerned about the possibility of infection. Fortunately there's no signs of systemic infection. No fevers, chills, nausea, or vomiting noted at this time. Patient History Information obtained from Patient. Family History Cancer - Siblings, Heart Disease - Mother,Siblings,Father, Hypertension - Mother,Father,Siblings, Stroke - Father, No family history of Diabetes, Hereditary Spherocytosis, Kidney Disease, Lung Disease, Seizures, Thyroid Problems, Tuberculosis. Social History Never smoker, Marital Status - Married, Alcohol Use - Never, Drug Use - No History, Caffeine Use - Daily. Medical History Eyes Denies history of Cataracts, Glaucoma Ear/Nose/Mouth/Throat Denies history of Chronic sinus problems/congestion, Middle ear problems Hematologic/Lymphatic Patient has history of Anemia [name], Kathleen J. ([id number]) Denies history of Hemophilia, Human Immunodeficiency Virus, Lymphedema, Sickle Cell Disease Respiratory Denies history of  Aspiration, Asthma, Chronic Obstructive Pulmonary Disease (COPD), Pneumothorax, Sleep Apnea, Tuberculosis Cardiovascular Patient has history of Arrhythmia - a fib, Congestive Heart Failure, Hypertension Denies history of Angina, Coronary Artery Disease, Deep Vein Thrombosis, Hypotension, Myocardial Infarction, Peripheral Arterial Disease, Peripheral Venous Disease, Phlebitis, Vasculitis Gastrointestinal Denies history of Cirrhosis , Colitis, Crohn s, Hepatitis A, Hepatitis B, Hepatitis C Endocrine Patient has history of Type II Diabetes Denies history of Type I Diabetes Genitourinary Patient has history of End Stage Renal Disease - CKD stage 3 Immunological Denies history of Lupus Erythematosus, Raynaud s, Scleroderma Integumentary (Skin) Denies history of History of Burn, History of pressure wounds Musculoskeletal Patient has history of Osteoarthritis Denies history of Gout, Rheumatoid Arthritis, Osteomyelitis Neurologic Denies history of Dementia, Neuropathy, Quadriplegia, Paraplegia, Seizure Disorder Oncologic Denies history of Received Chemotherapy, Received Radiation Psychiatric Denies history of Anorexia/bulimia, Confinement Anxiety Medical And Surgical History Notes Cardiovascular HLD, mitral insufficiency Musculoskeletal 4 broken ribs Review of Systems (ROS) Constitutional Symptoms (General Health) Denies complaints or symptoms of Fatigue, Fever, Chills, Marked Weight Change. Respiratory Denies complaints or symptoms of Chronic or frequent coughs, Shortness of Breath. Cardiovascular Denies complaints or symptoms of Chest pain, LE edema. Psychiatric Denies complaints or symptoms of Anxiety, Claustrophobia. Objective Constitutional Well-nourished and well-hydrated in no acute distress. Vitals Time Taken: 1:36 PM, Height: 63 in, Weight: 102 lbs, BMI: 18.1, Temperature: 97.7 F, Pulse: 68 bpm, Respiratory [name], Kathleen J. ([id number]) Rate: 16 breaths/min, Blood  Pressure: 168/77 mmHg. Respiratory normal breathing without difficulty. clear to auscultation bilaterally. Cardiovascular regular rate and rhythm with normal S1, S2. Psychiatric this patient is able to make decisions and demonstrates good insight into disease process. Alert and Oriented x 3. pleasant and cooperative. General Notes: Patient's wound bed currently did show evidence of necrotic tissue including necrotic tendon on the surface of the wound at this point still. She does have some granulation around the edge although again she really needs surgical debridement to clear away the tendon in general as a whole and then likely a Wound VAC following. This is what her podiatrist has recommended that again because of the Covid-19 Virus pandemic he has not been able to get her scheduled for this currently. Integumentary (Hair, Skin) Wound #1 status is Open. Original cause of wound was Gradually Appeared. The wound is located on the Right Achilles. The wound measures 4.5cm length x 2.5cm width x 0.1cm depth; 8.836cm^2 area and 0.884cm^3 volume. There is tendon and Fat Layer (Subcutaneous Tissue) Exposed exposed. There is no tunneling or undermining noted. There is a  large amount of serous drainage noted. The wound margin is flat and intact. There is no granulation within the wound bed. There is a large (67-100%) amount of necrotic tissue within the wound bed including Eschar and Adherent Slough. The periwound skin appearance exhibited: Rubor. The periwound skin appearance did not exhibit: Callus, Crepitus, Excoriation, Induration, Rash, Scarring, Dry/Scaly, Maceration, Atrophie Blanche, Cyanosis, Ecchymosis, Hemosiderin Staining, Mottled, Pallor, Erythema. Periwound temperature was noted as No Abnormality. The periwound has tenderness on palpation. Assessment Active Problems ICD-10 Other specified peripheral vascular diseases Non-pressure chronic ulcer of other part of right lower leg with  other specified severity Chronic kidney disease, stage 3 (moderate) Chronic atrial fibrillation, unspecified Plan Wound Cleansing: Wound #1 Right Achilles: Clean wound with Normal Saline. Primary Wound Dressing: Wound #1 Right Achilles: Santyl Ointment - to wound bed - HHRN to do this as well please Xeroform - over santyl to trap moisture on tendon Secondary Dressing: [name], SELVY. ([id number]) Wound #1 Right Achilles: ABD and Kerlix/Conform Dressing Change Frequency: Wound #1 Right Achilles: Change dressing every day. Follow-up Appointments: Wound #1 Right Achilles: Return Appointment in 1 week. Home Health: Wound #1 Right Achilles: [place] Nurse may visit PRN to address patient s wound care needs. FACE TO FACE ENCOUNTER: MEDICARE and MEDICAID PATIENTS: I certify that this patient is under my care and that I had a face-to-face encounter that meets the physician face-to-face encounter requirements with this patient on this date. The encounter with the patient was in whole or in part for the following MEDICAL CONDITION: (primary reason for [place]) MEDICAL NECESSITY: I certify, that based on my findings, NURSING services are a medically necessary home health service. HOME BOUND STATUS: I certify that my clinical findings support that this patient is homebound (i.e., Due to illness or injury, pt requires aid of supportive devices such as crutches, cane, wheelchairs, walkers, the use of special transportation or the assistance of another person to leave their place of residence. There is a normal inability to leave the home and doing so requires considerable and taxing effort. Other absences are for medical reasons / religious services and are infrequent or of short duration when for other reasons). If current dressing causes regression in wound condition, may D/C ordered dressing product/s and apply Normal Saline Moist Dressing daily until next  [place] / Other MD appointment. [place] of regression in wound condition at [phone]. Please direct any NON-WOUND related issues/requests for orders to patient's Primary Care Physician Medications-please add to medication list.: Wound #1 Right Achilles: Santyl Enzymatic Ointment Laboratory ordered were: Wound culture routine The following medication(s) was prescribed: doxycycline hyclate oral 100 mg capsule 1 1 capsule oral taken 2 times a day for 14 days. Do not take calcium and vitamins with this medication. starting 08/11/2018 My suggestion is gonna be currently that we continue with the Current wound care measures am gonna go and send in a prescription for the doxycycline which she has taken before without complication. We will subsequently see were things stand at follow-up next week. Please see above for specific wound care orders. We will see patient for re-evaluation in 1 week(s) here in the clinic. If anything worsens or changes patient will contact our office for additional recommendations. Electronic Signature(s) Signed: 08/11/2018 4:18:17 PM By: Worthy Keeler PA-C Entered By: Worthy Keeler on 08/11/2018 14:11:18 [place], [name] ([id number]) -------------------------------------------------------------------------------- ROS/PFSH Details Patient Name: [name] Date of Service: 08/11/2018 1:30 PM  Medical Record Number: [id number] Patient Account Number: [id number] Date of Birth/Sex: Jan 02, 1926 (85 y.o. F) Treating RN: [name] Primary Care Provider: Thereasa Distance Other Clinician: Referring Provider: Thereasa Distance Treating Provider/Extender: Melburn Hake, Maxemiliano Riel Weeks in Treatment: 10 Information Obtained From Patient Constitutional Symptoms (General Health) Complaints and Symptoms: Negative for: Fatigue; Fever; Chills; Marked Weight Change Respiratory Complaints and Symptoms: Negative for: Chronic or frequent coughs; Shortness  of Breath Medical History: Negative for: Aspiration; Asthma; Chronic Obstructive Pulmonary Disease (COPD); Pneumothorax; Sleep Apnea; Tuberculosis Cardiovascular Complaints and Symptoms: Negative for: Chest pain; LE edema Medical History: Positive for: Arrhythmia - a fib; Congestive Heart Failure; Hypertension Negative for: Angina; Coronary Artery Disease; Deep Vein Thrombosis; Hypotension; Myocardial Infarction; Peripheral Arterial Disease; Peripheral Venous Disease; Phlebitis; Vasculitis Past Medical History Notes: HLD, mitral insufficiency Psychiatric Complaints and Symptoms: Negative for: Anxiety; Claustrophobia Medical History: Negative for: Anorexia/bulimia; Confinement Anxiety Eyes Medical History: Negative for: Cataracts; Glaucoma Ear/Nose/Mouth/Throat Medical History: Negative for: Chronic sinus problems/congestion; Middle ear problems Hematologic/Lymphatic Medical History: Positive for: Anemia Chesler, Kurstyn J. ([id number]) Negative for: Hemophilia; Human Immunodeficiency Virus; Lymphedema; Sickle Cell Disease Gastrointestinal Medical History: Negative for: Cirrhosis ; Colitis; Crohnos; Hepatitis A; Hepatitis B; Hepatitis C Endocrine Medical History: Positive for: Type II Diabetes Negative for: Type I Diabetes Treated with: Insulin Blood sugar tested every day: Yes Tested : Genitourinary Medical History: Positive for: End Stage Renal Disease - CKD stage 3 Immunological Medical History: Negative for: Lupus Erythematosus; Raynaudos; Scleroderma Integumentary (Skin) Medical History: Negative for: History of Burn; History of pressure wounds Musculoskeletal Medical History: Positive for: Osteoarthritis Negative for: Gout; Rheumatoid Arthritis; Osteomyelitis Past Medical History Notes: 4 broken ribs Neurologic Medical History: Negative for: Dementia; Neuropathy; Quadriplegia; Paraplegia; Seizure Disorder Oncologic Medical History: Negative for: Received  Chemotherapy; Received Radiation Immunizations Pneumococcal Vaccine: Received Pneumococcal Vaccination: Yes Immunization Notes: up to date Implantable Devices No devices added Family and Social History Binns, OMAH DEWALT. ([id number]) Cancer: Yes - Siblings; Diabetes: No; Heart Disease: Yes - Mother,Siblings,Father; Hereditary Spherocytosis: No; Hypertension: Yes - Mother,Father,Siblings; Kidney Disease: No; Lung Disease: No; Seizures: No; Stroke: Yes - Father; Thyroid Problems: No; Tuberculosis: No; Never smoker; Marital Status - Married; Alcohol Use: Never; Drug Use: No History; Caffeine Use: Daily; Financial Concerns: No; Food, Clothing or Shelter Needs: No; Support System Lacking: No; Transportation Concerns: No Physician Affirmation I have reviewed and agree with the above information. Electronic Signature(s) Signed: 08/11/2018 4:18:17 PM By: Worthy Keeler PA-C Signed: 08/11/2018 4:21:25 PM By: [name] Entered By: Worthy Keeler on 08/11/2018 14:08:58 Ose, [name] ([id number]) -------------------------------------------------------------------------------- SuperBill Details Patient Name: [name] Date of Service: 08/11/2018 Medical Record Number: [id number] Patient Account Number: [id number] Date of Birth/Sex: 23-Jun-1925 (85 y.o. F) Treating RN: [name] Primary Care Provider: Thereasa Distance Other Clinician: Referring Provider: Thereasa Distance Treating Provider/Extender: Melburn Hake, Zarayah Lanting Weeks in Treatment: 10 Diagnosis Coding ICD-10 Codes Code Description I73.89 Other specified peripheral vascular diseases L97.818 Non-pressure chronic ulcer of other part of right lower leg with other specified severity N18.3 Chronic kidney disease, stage 3 (moderate) I48.20 Chronic atrial fibrillation, unspecified Facility Procedures CPT4 Code: [id number] Description: 76720 - [place] W/O ANES NON SELECT Modifier: Quantity: 1 Physician Procedures CPT4 Code Description:  [id number] 83662 - WC PHYS LEVEL 4 - EST PT ICD-10 Diagnosis Description I73.89 Other specified peripheral vascular diseases L97.818 Non-pressure chronic ulcer of other part of right lower leg wit N18.3 Chronic kidney disease, stage 3  (moderate) I48.20 Chronic atrial fibrillation, unspecified Modifier: h other specified Quantity: 1 severity Electronic  Signature(s) Signed: 08/11/2018 2:29:17 PM By: [name] Signed: 08/11/2018 4:18:17 PM By: Worthy Keeler PA-C Entered By: [name] on 08/11/2018 14:29:17

## 2018-08-17 LAB — AEROBIC/ANAEROBIC CULTURE W GRAM STAIN (SURGICAL/DEEP WOUND): Gram Stain: NONE SEEN

## 2018-08-17 LAB — AEROBIC/ANAEROBIC CULTURE (SURGICAL/DEEP WOUND)

## 2018-08-17 NOTE — Progress Notes (Signed)
KEERSTIN, BJELLAND ([id number]) Visit Report for 08/11/2018 Arrival Information Details Patient Name: SHARNELL, KNIGHT Date of Service: 08/11/2018 1:30 PM Medical Record Number: [id number] Patient Account Number: [id number] Date of Birth/Sex: July 07, 1925 (85 y.o. F) Treating RN: Harold Barban Primary Care Bladen Umar: Thereasa Distance Other Clinician: Referring Meko Bellanger: Thereasa Distance Treating Afnan Cadiente/Extender: Melburn Hake, HOYT Weeks in Treatment: 10 Visit Information History Since Last Visit Added or deleted any medications: No Patient Arrived: Wheel Chair Any new allergies or adverse reactions: No Arrival Time: 13:35 Had a fall or experienced change in No Accompanied By: son activities of daily living that may affect Transfer Assistance: None risk of falls: Patient Identification Verified: Yes Signs or symptoms of abuse/neglect since last visito No Secondary Verification Process Completed: Yes Hospitalized since last visit: No Patient Has Alerts: Yes Has Dressing in Place as Prescribed: Yes Patient Alerts: DMII Pain Present Now: Yes Electronic Signature(s) Signed: 08/17/2018 8:05:03 AM By: Harold Barban Entered By: Harold Barban on 08/11/2018 13:35:46 Newcombe, [name] ([id number]) -------------------------------------------------------------------------------- Encounter Discharge Information Details Patient Name: [name] Date of Service: 08/11/2018 1:30 PM Medical Record Number: [id number] Patient Account Number: [id number] Date of Birth/Sex: Oct 28, 1925 (85 y.o. F) Treating RN: Cornell Barman Primary Care Tagen Milby: Thereasa Distance Other Clinician: Referring Lynann Demetrius: Thereasa Distance Treating Janalee Grobe/Extender: Melburn Hake, HOYT Weeks in Treatment: 10 Encounter Discharge Information Items Discharge Condition: Stable Ambulatory Status: Wheelchair Discharge Destination: Home Transportation: Private Auto Accompanied By: self Schedule Follow-up Appointment: Yes Clinical Summary of  Care: Electronic Signature(s) Signed: 08/11/2018 4:58:36 PM By: Gretta Cool, BSN, RN, CWS, Kim RN, BSN Entered By: Gretta Cool, BSN, RN, CWS, Kim on 08/11/2018 14:17:38 Akter, [name] ([id number]) -------------------------------------------------------------------------------- Lower Extremity Assessment Details Patient Name: [name] Date of Service: 08/11/2018 1:30 PM Medical Record Number: [id number] Patient Account Number: [id number] Date of Birth/Sex: July 10, 1925 (85 y.o. F) Treating RN: Harold Barban Primary Care Charlsie Fleeger: Thereasa Distance Other Clinician: Referring Ardel Jagger: Thereasa Distance Treating Melvyn Hommes/Extender: Melburn Hake, HOYT Weeks in Treatment: 10 Vascular Assessment Pulses: Dorsalis Pedis Palpable: [Right:Yes] Posterior Tibial Palpable: [Right:Yes] Electronic Signature(s) Signed: 08/17/2018 8:05:03 AM By: Harold Barban Entered By: Harold Barban on 08/11/2018 13:42:28 Emig, [name] ([id number]) -------------------------------------------------------------------------------- Multi Wound Chart Details Patient Name: [name] Date of Service: 08/11/2018 1:30 PM Medical Record Number: [id number] Patient Account Number: [id number] Date of Birth/Sex: 10-14-1925 (85 y.o. F) Treating RN: [name] Primary Care Kambree Krauss: Thereasa Distance Other Clinician: Referring Shellyann Wandrey: Thereasa Distance Treating Neila Teem/Extender: Melburn Hake, HOYT Weeks in Treatment: 10 Vital Signs Height(in): 47 Pulse(bpm): 79 Weight(lbs): 102 Blood Pressure(mmHg): 168/77 Body Mass Index(BMI): 18 Temperature(F): 97.7 Respiratory Rate 16 (breaths/min): Photos: [N/A:N/A] Wound Location: Right Achilles N/A N/A Wounding Event: Gradually Appeared N/A N/A Primary Etiology: Arterial Insufficiency Ulcer N/A N/A Secondary Etiology: Diabetic Wound/Ulcer of the N/A N/A Lower Extremity Comorbid History: Anemia, Arrhythmia, N/A N/A Congestive Heart Failure, Hypertension, Type II Diabetes, End Stage  Renal Disease, Osteoarthritis Date Acquired: 04/01/2018 N/A N/A Weeks of Treatment: 10 N/A N/A Wound Status: Open N/A N/A Pending Amputation on Yes N/A N/A Presentation: Measurements L x W x D 4.5x2.5x0.1 N/A N/A (cm) Area (cm) : 8.836 N/A N/A Volume (cm) : 0.884 N/A N/A % Reduction in Area: -85.00% N/A N/A % Reduction in Volume: -84.90% N/A N/A Classification: Full Thickness With Exposed N/A N/A Support Structures Exudate Amount: Large N/A N/A Exudate Type: Serous N/A N/A Exudate Color: amber N/A N/A Wound Margin: Flat and Intact N/A N/A Granulation Amount: None Present (0%) N/A N/A Necrotic Amount: Large (67-100%) N/A N/A Cowin, Kayia  JMarland Kitchen ([id number]) Necrotic Tissue: Eschar, Adherent Slough N/A N/A Exposed Structures: Fat Layer (Subcutaneous N/A N/A Tissue) Exposed: Yes Tendon: Yes Fascia: No Muscle: No Joint: No Bone: No Epithelialization: None N/A N/A Periwound Skin Texture: Excoriation: No N/A N/A Induration: No Callus: No Crepitus: No Rash: No Scarring: No Periwound Skin Moisture: Maceration: No N/A N/A Dry/Scaly: No Periwound Skin Color: Rubor: Yes N/A N/A Atrophie Blanche: No Cyanosis: No Ecchymosis: No Erythema: No Hemosiderin Staining: No Mottled: No Pallor: No Temperature: No Abnormality N/A N/A Tenderness on Palpation: Yes N/A N/A Treatment Notes Electronic Signature(s) Signed: 08/11/2018 4:21:25 PM By: [name] Entered By: [name] on 08/11/2018 14:07:35 Conwell, [name] ([id number]) -------------------------------------------------------------------------------- Multi-Disciplinary Care Plan Details Patient Name: [name] Date of Service: 08/11/2018 1:30 PM Medical Record Number: [id number] Patient Account Number: [id number] Date of Birth/Sex: 02-01-1926 (85 y.o. F) Treating RN: [name] Primary Care Korayma Hagwood: Thereasa Distance Other Clinician: Referring Romeo Zielinski: Thereasa Distance Treating Chloey Ricard/Extender: Melburn Hake,  HOYT Weeks in Treatment: 10 Active Inactive Abuse / Safety / Falls / Self Care Management Nursing Diagnoses: Potential for falls Goals: Patient will not experience any injury related to falls Date Initiated: 06/16/2018 Target Resolution Date: 09/16/2018 Goal Status: Active Interventions: Assess fall risk on admission and as needed Notes: Necrotic Tissue Nursing Diagnoses: Knowledge deficit related to management of necrotic/devitalized tissue Goals: Patient/caregiver will verbalize understanding of reason and process for debridement of necrotic tissue Date Initiated: 06/16/2018 Target Resolution Date: 09/16/2018 Goal Status: Active Interventions: Provide education on necrotic tissue and debridement process Notes: Nutrition Nursing Diagnoses: Imbalanced nutrition Goals: Patient/caregiver agrees to and verbalizes understanding of need to use nutritional supplements and/or vitamins as prescribed Date Initiated: 06/16/2018 Target Resolution Date: 09/16/2018 Goal Status: Active Interventions: Assess patient nutrition upon admission and as needed per policy PEARLENA, OW ([id number]) Notes: Wound/Skin Impairment Nursing Diagnoses: Impaired tissue integrity Knowledge deficit related to ulceration/compromised skin integrity Goals: Ulcer/skin breakdown will have a volume reduction of 30% by week 4 Date Initiated: 06/02/2018 Target Resolution Date: 07/01/2018 Goal Status: Active Interventions: Assess patient/caregiver ability to obtain necessary supplies Assess patient/caregiver ability to perform ulcer/skin care regimen upon admission and as needed Assess ulceration(s) every visit Notes: Electronic Signature(s) Signed: 08/11/2018 4:21:25 PM By: [name] Entered By: [name] on 08/11/2018 14:07:29 Bristow, [name] ([id number]) -------------------------------------------------------------------------------- Pain Assessment Details Patient Name: [name] Date of Service:  08/11/2018 1:30 PM Medical Record Number: [id number] Patient Account Number: [id number] Date of Birth/Sex: 05-31-1925 (85 y.o. F) Treating RN: Harold Barban Primary Care Maison Kestenbaum: Thereasa Distance Other Clinician: Referring Deshara Rossi: Thereasa Distance Treating Jabori Henegar/Extender: Melburn Hake, HOYT Weeks in Treatment: 10 Active Problems Location of Pain Severity and Description of Pain Patient Has Paino Yes Site Locations Rate the pain. Current Pain Level: 8 Pain Management and Medication Current Pain Management: Electronic Signature(s) Signed: 08/17/2018 8:05:03 AM By: Harold Barban Entered By: Harold Barban on 08/11/2018 13:35:59 Mcallister, [name] ([id number]) -------------------------------------------------------------------------------- Patient/Caregiver Education Details Patient Name: [name] Date of Service: 08/11/2018 1:30 PM Medical Record Number: [id number] Patient Account Number: [id number] Date of Birth/Gender: [birth year] (85 y.o. F) Treating RN: [name] Primary Care Physician: Thereasa Distance Other Clinician: Referring Physician: Thereasa Distance Treating Physician/Extender: Sharalyn Ink in Treatment: 10 Education Assessment Education Provided To: Patient and Caregiver Education Topics Provided Wound/Skin Impairment: Handouts: Other: wound care to continue as ordered Methods: Demonstration, Explain/Verbal Responses: State content correctly Electronic Signature(s) Signed: 08/11/2018 4:21:25 PM By: [name] Entered By: [name] on 08/11/2018 14:29:57 Pohlman, Charlei J. (  [id number]) -------------------------------------------------------------------------------- Wound Assessment Details Patient Name: LILLEY, HUBBLE Date of Service: 08/11/2018 1:30 PM Medical Record Number: [id number] Patient Account Number: [id number] Date of Birth/Sex: 1925-09-29 (85 y.o. F) Treating RN: Harold Barban Primary Care Reon Hunley: Thereasa Distance Other  Clinician: Referring Mariangel Ringley: Thereasa Distance Treating Jacory Kamel/Extender: Melburn Hake, HOYT Weeks in Treatment: 10 Wound Status Wound Number: 1 Primary Arterial Insufficiency Ulcer Etiology: Wound Location: Right Achilles Secondary Diabetic Wound/Ulcer of the Lower Extremity Wounding Event: Gradually Appeared Etiology: Date Acquired: 04/01/2018 Wound Open Weeks Of Treatment: 10 Status: Clustered Wound: No Comorbid Anemia, Arrhythmia, Congestive Heart Failure, Pending Amputation On Presentation History: Hypertension, Type II Diabetes, End Stage Renal Disease, Osteoarthritis Photos Wound Measurements Length: (cm) 4.5 % Reduction Width: (cm) 2.5 % Reduction Depth: (cm) 0.1 Epitheliali Area: (cm) 8.836 Tunneling: Volume: (cm) 0.884 Underminin in Area: -85% in Volume: -84.9% zation: None No g: No Wound Description Full Thickness With Exposed Support Foul Odor A Classification: Structures Slough/Fibr Wound Margin: Flat and Intact Exudate Large Amount: Exudate Type: Serous Exudate Color: amber fter Cleansing: No ino Yes Wound Bed Granulation Amount: None Present (0%) Exposed Structure Necrotic Amount: Large (67-100%) Fascia Exposed: No Necrotic Quality: Eschar, Adherent Slough Fat Layer (Subcutaneous Tissue) Exposed: Yes Tendon Exposed: Yes Muscle Exposed: No Joint Exposed: No Pottenger, Anahi J. ([id number]) Bone Exposed: No Periwound Skin Texture Texture Color No Abnormalities Noted: No No Abnormalities Noted: No Callus: No Atrophie Blanche: No Crepitus: No Cyanosis: No Excoriation: No Ecchymosis: No Induration: No Erythema: No Rash: No Hemosiderin Staining: No Scarring: No Mottled: No Pallor: No Moisture Rubor: Yes No Abnormalities Noted: No Dry / Scaly: No Temperature / Pain Maceration: No Temperature: No Abnormality Tenderness on Palpation: Yes Treatment Notes Wound #1 (Right Achilles) Notes santyl, xeroform, abd, conform to  secure Electronic Signature(s) Signed: 08/17/2018 8:05:03 AM By: Harold Barban Entered By: Harold Barban on 08/11/2018 13:42:01 Krabill, [name] ([id number]) -------------------------------------------------------------------------------- Vitals Details Patient Name: [name] Date of Service: 08/11/2018 1:30 PM Medical Record Number: [id number] Patient Account Number: [id number] Date of Birth/Sex: 07-18-1925 (85 y.o. F) Treating RN: Harold Barban Primary Care Shadell Brenn: Thereasa Distance Other Clinician: Referring Kwesi Sangha: Thereasa Distance Treating Jaidyn Kuhl/Extender: Melburn Hake, HOYT Weeks in Treatment: 10 Vital Signs Time Taken: 13:36 Temperature (F): 97.7 Height (in): 63 Pulse (bpm): 68 Weight (lbs): 102 Respiratory Rate (breaths/min): 16 Body Mass Index (BMI): 18.1 Blood Pressure (mmHg): 168/77 Reference Range: 80 - 120 mg / dl Electronic Signature(s) Signed: 08/17/2018 8:05:03 AM By: Harold Barban Entered By: Harold Barban on 08/11/2018 13:37:06

## 2018-08-18 ENCOUNTER — Other Ambulatory Visit: Payer: Self-pay

## 2018-08-18 ENCOUNTER — Encounter: Payer: Medicare Other | Admitting: Physician Assistant

## 2018-08-18 DIAGNOSIS — E11622 Type 2 diabetes mellitus with other skin ulcer: Secondary | ICD-10-CM | POA: Diagnosis not present

## 2018-08-18 NOTE — Progress Notes (Signed)
ELYSA, WOMAC ([id number]) Visit Report for 08/18/2018 Arrival Information Details Patient Name: [name], [name] Date of Service: 08/18/2018 10:45 AM Medical Record Number: [id number] Patient Account Number: [id number] Date of Birth/Sex: 1925-05-19 (85 y.o. F) Treating RN: [name] Primary Care Chancy Smigiel: Thereasa Distance Other Clinician: Referring Jared Cahn: Thereasa Distance Treating Naly Schwanz/Extender: Melburn Hake, HOYT Weeks in Treatment: 11 Visit Information History Since Last Visit Added or deleted any medications: No Patient Arrived: Wheel Chair Any new allergies or adverse reactions: No Arrival Time: 10:17 Had a fall or experienced change in No Accompanied By: son activities of daily living that may affect Transfer Assistance: None risk of falls: Patient Has Alerts: Yes Signs or symptoms of abuse/neglect since last visito No Patient Alerts: DMII Hospitalized since last visit: No Implantable device outside of the clinic excluding No cellular tissue based products placed in the center since last visit: Has Dressing in Place as Prescribed: Yes Pain Present Now: No Electronic Signature(s) Signed: 08/18/2018 11:42:16 AM By: Lorine Bears RCP, RRT, CHT Entered By: Lorine Bears on 08/18/2018 10:18:15 Hice, [name] ([id number]) -------------------------------------------------------------------------------- Encounter Discharge Information Details Patient Name: [name] Date of Service: 08/18/2018 10:45 AM Medical Record Number: [id number] Patient Account Number: [id number] Date of Birth/Sex: 04-05-1926 (85 y.o. F) Treating RN: Cornell Barman Primary Care Mekhai Venuto: Thereasa Distance Other Clinician: Referring Jonne Rote: Thereasa Distance Treating Dellia Donnelly/Extender: Melburn Hake, HOYT Weeks in Treatment: 11 Encounter Discharge Information Items Post Procedure Vitals Discharge Condition: Stable Temperature (F): 97.7 Ambulatory Status: Wheelchair Pulse  (bpm): 65 Discharge Destination: Home Respiratory Rate (breaths/min): 16 Transportation: Private Auto Blood Pressure (mmHg): 135/52 Accompanied By: self Schedule Follow-up Appointment: Yes Clinical Summary of Care: Electronic Signature(s) Signed: 08/18/2018 11:30:11 AM By: Gretta Cool, BSN, RN, CWS, Kim RN, BSN Entered By: Gretta Cool, BSN, RN, CWS, Kim on 08/18/2018 11:30:10 Kitner, [name] ([id number]) -------------------------------------------------------------------------------- Lower Extremity Assessment Details Patient Name: [name] Date of Service: 08/18/2018 10:45 AM Medical Record Number: [id number] Patient Account Number: [id number] Date of Birth/Sex: Jun 04, 1925 (85 y.o. F) Treating RN: Harold Barban Primary Care Taliana Mersereau: Thereasa Distance Other Clinician: Referring Haivyn Oravec: Thereasa Distance Treating Antoni Stefan/Extender: Melburn Hake, HOYT Weeks in Treatment: 11 Vascular Assessment Pulses: Dorsalis Pedis Palpable: [Right:Yes] Posterior Tibial Palpable: [Right:Yes] Electronic Signature(s) Signed: 08/18/2018 3:56:24 PM By: Harold Barban Entered By: Harold Barban on 08/18/2018 10:26:05 Halberstam, [name] ([id number]) -------------------------------------------------------------------------------- Multi Wound Chart Details Patient Name: [name] Date of Service: 08/18/2018 10:45 AM Medical Record Number: [id number] Patient Account Number: [id number] Date of Birth/Sex: 1925/08/07 (85 y.o. F) Treating RN: [name] Primary Care Kingston Shawgo: Thereasa Distance Other Clinician: Referring Jashae Wiggs: Thereasa Distance Treating Arad Burston/Extender: Melburn Hake, HOYT Weeks in Treatment: 11 Vital Signs Height(in): 63 Pulse(bpm): 65 Weight(lbs): 102 Blood Pressure(mmHg): 135/52 Body Mass Index(BMI): 18 Temperature(F): 97.7 Respiratory Rate 16 (breaths/min): Photos: [N/A:N/A] Wound Location: Right Achilles N/A N/A Wounding Event: Gradually Appeared N/A N/A Primary Etiology: Arterial  Insufficiency Ulcer N/A N/A Secondary Etiology: Diabetic Wound/Ulcer of the N/A N/A Lower Extremity Comorbid History: Anemia, Arrhythmia, N/A N/A Congestive Heart Failure, Hypertension, Type II Diabetes, End Stage Renal Disease, Osteoarthritis Date Acquired: 04/01/2018 N/A N/A Weeks of Treatment: 11 N/A N/A Wound Status: Open N/A N/A Pending Amputation on Yes N/A N/A Presentation: Measurements L x W x D 4.5x2.5x0.1 N/A N/A (cm) Area (cm) : 8.836 N/A N/A Volume (cm) : 0.884 N/A N/A % Reduction in Area: -85.00% N/A N/A % Reduction in Volume: -84.90% N/A N/A Classification: Full Thickness With Exposed N/A N/A Support Structures Exudate Amount: Large N/A N/A  Exudate Type: Serous N/A N/A Exudate Color: amber N/A N/A Wound Margin: Flat and Intact N/A N/A Granulation Amount: None Present (0%) N/A N/A Necrotic Amount: Large (67-100%) N/A N/A Jani, Kathleen J. ([id number]) Necrotic Tissue: Eschar, Adherent Slough N/A N/A Exposed Structures: Fat Layer (Subcutaneous N/A N/A Tissue) Exposed: Yes Tendon: Yes Fascia: No Muscle: No Joint: No Bone: No Epithelialization: None N/A N/A Periwound Skin Texture: Excoriation: No N/A N/A Induration: No Callus: No Crepitus: No Rash: No Scarring: No Periwound Skin Moisture: Maceration: No N/A N/A Dry/Scaly: No Periwound Skin Color: Rubor: Yes N/A N/A Atrophie Blanche: No Cyanosis: No Ecchymosis: No Erythema: No Hemosiderin Staining: No Mottled: No Pallor: No Temperature: No Abnormality N/A N/A Tenderness on Palpation: Yes N/A N/A Treatment Notes Electronic Signature(s) Signed: 08/18/2018 4:32:22 PM By: [name] Entered By: [name] on 08/18/2018 10:32:18 Kittle, [name] ([id number]) -------------------------------------------------------------------------------- Multi-Disciplinary Care Plan Details Patient Name: [name] Date of Service: 08/18/2018 10:45 AM Medical Record Number: [id number] Patient Account Number:  [id number] Date of Birth/Sex: October 11, 1925 (85 y.o. F) Treating RN: [name] Primary Care Glynn Freas: Thereasa Distance Other Clinician: Referring Mirl Hillery: Thereasa Distance Treating Finch Costanzo/Extender: Melburn Hake, HOYT Weeks in Treatment: 11 Active Inactive Abuse / Safety / Falls / Self Care Management Nursing Diagnoses: Potential for falls Goals: Patient will not experience any injury related to falls Date Initiated: 06/16/2018 Target Resolution Date: 09/16/2018 Goal Status: Active Interventions: Assess fall risk on admission and as needed Notes: Necrotic Tissue Nursing Diagnoses: Knowledge deficit related to management of necrotic/devitalized tissue Goals: Patient/caregiver will verbalize understanding of reason and process for debridement of necrotic tissue Date Initiated: 06/16/2018 Target Resolution Date: 09/16/2018 Goal Status: Active Interventions: Provide education on necrotic tissue and debridement process Notes: Nutrition Nursing Diagnoses: Imbalanced nutrition Goals: Patient/caregiver agrees to and verbalizes understanding of need to use nutritional supplements and/or vitamins as prescribed Date Initiated: 06/16/2018 Target Resolution Date: 09/16/2018 Goal Status: Active Interventions: Assess patient nutrition upon admission and as needed per policy [name], [name] ([id number]) Notes: Wound/Skin Impairment Nursing Diagnoses: Impaired tissue integrity Knowledge deficit related to ulceration/compromised skin integrity Goals: Ulcer/skin breakdown will have a volume reduction of 30% by week 4 Date Initiated: 06/02/2018 Target Resolution Date: 07/01/2018 Goal Status: Active Interventions: Assess patient/caregiver ability to obtain necessary supplies Assess patient/caregiver ability to perform ulcer/skin care regimen upon admission and as needed Assess ulceration(s) every visit Notes: Electronic Signature(s) Signed: 08/18/2018 4:32:22 PM By: [name] Entered By:  [name] on 08/18/2018 10:32:10 Nester, [name] ([id number]) -------------------------------------------------------------------------------- Pain Assessment Details Patient Name: [name] Date of Service: 08/18/2018 10:45 AM Medical Record Number: [id number] Patient Account Number: [id number] Date of Birth/Sex: [birth year] (85 y.o. F) Treating RN: [name] Primary Care Charleton Deyoung: Thereasa Distance Other Clinician: Referring Sharren Schnurr: Thereasa Distance Treating Yitzchok Carriger/Extender: Melburn Hake, HOYT Weeks in Treatment: 11 Active Problems Location of Pain Severity and Description of Pain Patient Has Paino No Site Locations Pain Management and Medication Current Pain Management: Electronic Signature(s) Signed: 08/18/2018 11:42:16 AM By: Paulla Fore, RRT, CHT Signed: 08/18/2018 4:32:22 PM By: [name] Entered By: Lorine Bears on 08/18/2018 10:18:23 Cody, [name] ([id number]) -------------------------------------------------------------------------------- Patient/Caregiver Education Details Patient Name: [name] Date of Service: 08/18/2018 10:45 AM Medical Record Number: [id number] Patient Account Number: [id number] Date of Birth/Gender: 05-Feb-1926 (85 y.o. F) Treating RN: [name] Primary Care Physician: Thereasa Distance Other Clinician: Referring Physician: Thereasa Distance Treating Physician/Extender: Sharalyn Ink in Treatment: 11 Education Assessment Education Provided To: Patient and Caregiver Education Topics  Provided Wound/Skin Impairment: Handouts: Other: wound care to continue as ordered Methods: Demonstration, Explain/Verbal Responses: State content correctly Electronic Signature(s) Signed: 08/18/2018 4:32:22 PM By: [name] Entered By: [name] on 08/18/2018 10:36:02 Mille, [name] ([id number]) -------------------------------------------------------------------------------- Wound Assessment  Details Patient Name: [name] Date of Service: 08/18/2018 10:45 AM Medical Record Number: [id number] Patient Account Number: [id number] Date of Birth/Sex: 12-Nov-1925 (85 y.o. 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 Wounding Event: Gradually Appeared Etiology: Date Acquired: 04/01/2018 Wound Open Weeks Of Treatment: 11 Status: Clustered Wound: No Comorbid Anemia, Arrhythmia, Congestive Heart Failure, Pending Amputation On Presentation History: Hypertension, Type II Diabetes, End Stage Renal Disease, Osteoarthritis Photos Wound Measurements Length: (cm) 4.5 % Reduction Width: (cm) 2.5 % Reduction Depth: (cm) 0.1 Epitheliali Area: (cm) 8.836 Tunneling: Volume: (cm) 0.884 Underminin in Area: -85% in Volume: -84.9% zation: None No g: No Wound Description Full Thickness With Exposed Support Foul Odor Classification: Structures Slough/Fib Wound Margin: Flat and Intact Exudate Large Amount: Exudate Type: Serous Exudate Color: amber After Cleansing: No rino Yes Wound Bed Granulation Amount: None Present (0%) Exposed Structure Necrotic Amount: Large (67-100%) Fascia Exposed: No Necrotic Quality: Eschar, Adherent Slough Fat Layer (Subcutaneous Tissue) Exposed: Yes Tendon Exposed: Yes Muscle Exposed: No Joint Exposed: No Deeds, Sundee J. ([id number]) Bone Exposed: No Periwound Skin Texture Texture Color No Abnormalities Noted: No No Abnormalities Noted: No Callus: No Atrophie Blanche: No Crepitus: No Cyanosis: No Excoriation: No Ecchymosis: No Induration: No Erythema: No Rash: No Hemosiderin Staining: No Scarring: No Mottled: No Pallor:  No Moisture Rubor: Yes No Abnormalities Noted: No Dry / Scaly: No Temperature / Pain Maceration: No Temperature: No Abnormality Tenderness on Palpation: Yes Treatment Notes Wound #1 (Right Achilles) Notes santyl, xeroform, abd, conform to secure Electronic Signature(s) Signed: 08/18/2018 3:56:24 PM By: Harold Barban Entered By: Harold Barban on 08/18/2018 10:25:48 Aarons, [name] ([id number]) -------------------------------------------------------------------------------- Vitals Details Patient Name: [name] Date of Service: 08/18/2018 10:45 AM Medical Record Number: [id number] Patient Account Number: [id number] Date of Birth/Sex: [birth year] (85 y.o. F) Treating RN: [name] Primary Care Verneta Hamidi: Thereasa Distance Other Clinician: Referring Deveon Kisiel: Thereasa Distance Treating Hanifa Antonetti/Extender: Melburn Hake, HOYT Weeks in Treatment: 11 Vital Signs Time Taken: 10:18 Temperature (F): 97.7 Height (in): 63 Pulse (bpm): 65 Weight (lbs): 102 Respiratory Rate (breaths/min): 16 Body Mass Index (BMI): 18.1 Blood Pressure (mmHg): 135/52 Reference Range: 80 - 120 mg / dl Electronic Signature(s) Signed: 08/18/2018 11:42:16 AM By: Lorine Bears RCP, RRT, CHT Entered By: Lorine Bears on 08/18/2018 10:20:38

## 2018-08-18 NOTE — Progress Notes (Signed)
[name], [name] ([id number]) Visit Report for 08/18/2018 Chief Complaint Document Details Patient Name: [name], [name] Date of Service: 08/18/2018 10:45 AM Medical Record Number: [id number] Patient Account Number: [id number] Date of Birth/Sex: [birth year] (85 y.o. F) Treating RN: [name] Primary Care Provider: Thereasa Distance Other Clinician: Referring Provider: Thereasa Distance Treating Provider/Extender: Melburn Hake, HOYT Weeks in Treatment: 11 Information Obtained from: Patient Chief Complaint Right Lower leg Achilles ulcer Electronic Signature(s) Signed: 08/18/2018 4:21:06 PM By: Worthy Keeler PA-C Entered By: Worthy Keeler on 08/18/2018 10:18:18 [name], [name] ([id number]) -------------------------------------------------------------------------------- Debridement Details Patient Name: [name] Date of Service: 08/18/2018 10:45 AM Medical Record Number: [id number] Patient Account Number: [id number] Date of Birth/Sex: [birth year] (85 y.o. F) Treating RN: [name] Primary Care Provider: Thereasa Distance Other Clinician: Referring Provider: Thereasa Distance Treating Provider/Extender: Melburn Hake, HOYT Weeks in Treatment: 11 Debridement Performed for Wound #1 Right Achilles Assessment: Performed By: Clinician [name], RN Debridement Type: Chemical/Enzymatic/Mechanical Agent Used: Santyl Severity of Tissue Pre Muscle involvement without necrosis Debridement: Level of Consciousness (Pre- Awake and Alert procedure): Pre-procedure Verification/Time Yes - 10:35 Out Taken: Start Time: 10:35 Pain Control: Lidocaine 4% Topical Solution Instrument: Other : tongue blade Bleeding: None End Time: 10:36 Procedural Pain: 0 Post Procedural Pain: 0 Response to Treatment: Procedure was tolerated well Level of Consciousness Awake and Alert (Post-procedure): Post Debridement Measurements of Total Wound Length: (cm) 4.5 Width: (cm) 2.5 Depth: (cm) 0.1 Volume:  (cm) 0.884 Character of Wound/Ulcer Post Debridement: Requires Further Debridement Severity of Tissue Post Debridement: Muscle involvement without necrosis Post Procedure Diagnosis Same as Pre-procedure Electronic Signature(s) Signed: 08/18/2018 4:21:06 PM By: Worthy Keeler PA-C Signed: 08/18/2018 4:32:22 PM By: [name] Entered By: [name] on 08/18/2018 10:35:40 [name], [name] ([id number]) -------------------------------------------------------------------------------- HPI Details Patient Name: [name] Date of Service: 08/18/2018 10:45 AM Medical Record Number: [id number] Patient Account Number: [id number] Date of Birth/Sex: [birth year] (85 y.o. F) Treating RN: [name] Primary Care Provider: Thereasa Distance Other Clinician: Referring Provider: Thereasa Distance Treating Provider/Extender: Melburn Hake, HOYT Weeks in Treatment: 11 History of Present Illness HPI Description: 06/02/18 patient presents today for initial evaluation our clinic concerning the ulcers that she has on the right Achilles location. 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 She was subsequently supposed to have had a scheduled likely stent placement in December 2019 unfortunately she had a fall with a sub dural hematoma which subsequently had to be evacuated partially at least. For that reason she has never gotten back in with Dr. Delana Meyer to have the vascular procedure. She does not have any appointment scheduled with him at this time. The patient does have a history of chronic kidney disease stage III, congestive heart failure, trophy relation, and at this point home health who has been coming out has been using [facility]. This is been since she's been home towards the end of December as best we can tell. She does have a hemoglobin A1c of 7.0 which was obtained on 11/18/17. Currently the patient's right lower extremity that showed  diminished blood flow according to her arterial study which is dated 02/27/18. At that point her right ABI was 0.89 with a TBI of 0.36 and her left ABI was 1.13 with a TBI of 0.50. Her prior study which was in May on the 13th 2019 showed that she had a right ABI of one and a right TBI of 0.51 with a left ABI  of 0.72 and a TBI of 0.50. Obviously since May till now her right leg has diminished as far as the blood flows concerned patient's son who was with her today states that Dr. 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 I do believe that for limb salvage this patient needs to have the above stated procedure with Dr. Delana Meyer as soon as possible. 06/09/18 on evaluation today patient actually appears to be doing about the same in regard to her Achilles ulcer. This actually does have tended exposed however the tenant is remaining moist which is good news that's exactly what we want to see. Fortunately there's no signs of infection she does have an appointment with Dr. Delana Meyer this coming Tuesday, June 13, 2018. 06/16/18 on evaluation today patient actually appears to be doing rather well in regard to her right lower extremity at this point. The Achilles ulcer actually showed signs of being a little bit better as far as the overall appearance of the wound was concerned today. Fortunately there did not appear to be the evidence of infection at this time which is good news. With that being said she did have her angiogram where she did actually have a significant blockage of the 90% into her lower extremity. Fortunately between the balloon angioplasty followed by stating this was able to be improved to less than 5% residual stenosis according to Dr. Nino Parsley note that I read. She still may have some limitation into her foot in particular as far as the runoff is concerned but nonetheless in general appears to be doing much better which is great news. Fortunately there is no sign of  infection at this time. 06/23/18 on evaluation today patient's wound bed actually appears to be doing about the same although the central portion of the tendon actually appears to be somewhat dry this is definitely not good news. We want to try to keep this moist while allowing the edges of the wound hopefully start to cover the tendon that is the goal. Nonetheless I think that we may need to switch things up a little bit to try to improve the moisture trapping at this point. She does have better blood flow which is great we have applied for EpiFix although unfortunately as of right now this shows to be not covered. I think that we need to try to see about getting this approved as with the tenant expose me to do everything we can to try to get this covering of the skin as soon as possible. Again I'm not even sure that she'd be a candidate for plastic surgery intervention. 06/30/18 on evaluation today patient actually appears to be doing about the same in regard to her wound on the right Achilles region. The Achilles tendon is still exposed there does not appear to be any tissue growth around the edge while the tendon itself does not appear to be quite as dry as it was previous there's definitely necrosis of the tenant beginning to be noted unfortunately. For that reason I think that we really need to make an appointment for her to see a specialist in order to discuss the options with her in regard to this one. Fortunately there does not appear to be any signs of systemic infection or even local infection for that matter. 07/07/18 on evaluation today patient appears to be doing about the same in regard to her Achilles region also. There does not appear to be any signs of active infection at  this time which is good news. With that being said her Achilles tendon is very dry and appears to be more so due to the fact that apparently the last dressing that was put on was used hydrogel with a Dry gauze of the  top not what had been previously recommended. For that reason again the tendon does appear to be quite a bit Feig, Alliana J. ([id number]) more dry than what it was previous unfortunately. They still have not heard from orthopedics is for the referral for her Achilles region. We had made this referral last week on Tuesday the sign still hadn't heard anything and the office told him that they did not have a referral from [place] we therefore reset the referral then he still has not heard anything. 07/14/18 on evaluation today patient appears to be doing about the same in regard to her right Achilles ulcer. She has been tolerating the dressing changes without complication. With that being said nothing seems to really improve significantly in my pinion. She did see the orthopedic doctor earlier this week they recommended a referral to Dr. Vickki Muff her podiatrist for further evaluation as well as a potential referral to plastic surgery although that is not scheduled as of yet. The patient is scheduled to see her podiatrist on Monday. There's no signs of active infection at this time. 07/21/18 on evaluation today patient was actually seen on 07/17/18 by Dr. Vickki Muff who is a local podiatrist. Subsequently he did sharply debride in the office the wound to some degree to try to clear off the eschar and see how much of the tenant underlying was actually damaged. He however felt that with the patient really needed was more significant debridement of the Achilles tendon to clear this away and that a Wound VAC following. However he considers this a semi-elective surgery which subsequently the hospitals not allowing at this point. Nonetheless the patient son was present during the evaluation that she had there with the podiatrist and did we count that exactly what I was saying was exactly what he remembered as well. No fevers, chills, nausea, or vomiting noted at this time. 07/28/18 on evaluation today patient appears to be  doing a little better in regard to the necrotic tendon loosen up on the surface of her wound. She's been tolerating the dressing changes without complication. That does not appear to be any signs of active infection at this time which is good news. I'll be said that something we're trying to avoid. She does see Dr. Vickki Muff on Monday and just a couple of days. 08/04/18 on evaluation today patient appears to be doing about the same in regard to her wound on the posterior Achilles region. The tendon is still softening up but does not appear to show any signs of infection at this time. She did see her podiatrist on Monday he still feels like she needs surgery to remove the tendon in its entirety although right now again still with the hospital lockdown there's no indication that this can be done. Again this is due to the nature of surgeries that are allowed and hers apparently does not fall in the category of essential. Nonetheless I think that this is something that needs to be done sooner rather than later based on what I'm seeing. 08/11/18 on evaluation today patient appears to be doing about the same that she does have some erythema around the edge of the wound. This is coupled with the fact she's had more drainage as well  as increased pain all of which combine to make me concerned about the possibility of infection. Fortunately there's no signs of systemic infection. No fevers, chills, nausea, or vomiting noted at this time. 08/18/18 on evaluation today patient actually appears to be doing about the same though a little bit of the necrotic tendon is actually softening up with use of the Santyl. Fortunately there's no signs of active infection at this time. She's been tolerating the dressing changes without complication. Electronic Signature(s) Signed: 08/18/2018 4:21:06 PM By: Worthy Keeler PA-C Entered By: Worthy Keeler on 08/18/2018 13:38:57 [name], [name]  ([id number]) -------------------------------------------------------------------------------- Physical Exam Details Patient Name: [name] Date of Service: 08/18/2018 10:45 AM Medical Record Number: [id number] Patient Account Number: [id number] Date of Birth/Sex: July 03, 1925 (85 y.o. F) Treating RN: [name] Primary Care Provider: Thereasa Distance Other Clinician: Referring Provider: Thereasa Distance Treating Provider/Extender: Melburn Hake, HOYT Weeks in Treatment: 27 Constitutional Well-nourished and well-hydrated in no acute distress. Respiratory normal breathing without difficulty. clear to auscultation bilaterally. Cardiovascular regular rate and rhythm with normal S1, S2. Psychiatric this patient is able to make decisions and demonstrates good insight into disease process. Alert and Oriented x 3. pleasant and cooperative. Notes Patient's wound bed currently showed evidence of good skin around the edge there does not appear to be any evidence of infection at this time I believe the antibiotics have been helpful for her. With that being said she does seem to be experiencing some pain still again I think this is just due to the nature and scope of the wound itself. Again I still think that you need surgical debridement with Dr. Vickki Muff which he of course has with me as well once and for backup for the surgery possibility. Electronic Signature(s) Signed: 08/18/2018 4:21:06 PM By: Worthy Keeler PA-C Entered By: Worthy Keeler on 08/18/2018 13:39:46 [name], [name] ([id number]) -------------------------------------------------------------------------------- Physician Orders Details Patient Name: [name] Date of Service: 08/18/2018 10:45 AM Medical Record Number: [id number] Patient Account Number: [id number] Date of Birth/Sex: 1925/08/03 (85 y.o. F) Treating RN: [name] Primary Care Provider: Thereasa Distance Other Clinician: Referring Provider: Thereasa Distance Treating Provider/Extender: Melburn Hake, HOYT Weeks in Treatment: 27 Verbal / Phone Orders: No Diagnosis Coding ICD-10 Coding Code Description I73.89 Other specified peripheral vascular diseases L97.818 Non-pressure chronic ulcer of other part of right lower leg with other specified severity N18.3 Chronic kidney disease, stage 3 (moderate) I48.20 Chronic atrial fibrillation, unspecified Wound Cleansing Wound #1 Right Achilles o Clean wound with Normal Saline. Primary Wound Dressing Wound #1 Right Achilles o Santyl Ointment - to wound bed - HHRN to do this as well please o Xeroform - over santyl to trap moisture on tendon Secondary Dressing Wound #1 Right Achilles o ABD and Kerlix/Conform Dressing Change Frequency Wound #1 Right Achilles o Change dressing every day. Follow-up Appointments Wound #1 Right Achilles o Return Appointment in 1 week. Home Health Wound #1 Right Achilles o Continue Home Health Visits o Home Health Nurse may visit PRN to address patientos wound care needs. o FACE TO FACE ENCOUNTER: MEDICARE and MEDICAID PATIENTS: I certify that this patient is under my care and that I had a face-to-face encounter that meets the physician face-to-face encounter requirements with this patient on this date. The encounter with the patient was in whole or in part for the following MEDICAL CONDITION: (primary reason for [place]) MEDICAL NECESSITY: I certify, that based on my findings, NURSING services are a medically necessary home  health service. HOME BOUND STATUS: I certify that my clinical findings support that this patient is homebound (i.e., Due to illness or injury, pt requires aid of supportive devices such as crutches, cane, wheelchairs, walkers, the use of special transportation or the assistance of another person to leave their place of residence. There is a normal inability to leave the home and doing so requires considerable and taxing  effort. Other absences are for medical reasons / religious services and are infrequent or of short duration when for other reasons). BRITHANY, WHITWORTH ([id number]) o If current dressing causes regression in wound condition, may D/C ordered dressing product/s and apply Normal Saline Moist Dressing daily until next [place] / Other MD appointment. Zalma of regression in wound condition at [phone]. o Please direct any NON-WOUND related issues/requests for orders to patient's Primary Care Physician Medications-please add to medication list. Wound #1 Right Achilles o Santyl Enzymatic Ointment Patient Medications Allergies: Sulfa (Sulfonamide Antibiotics) Notifications Medication Indication Start End Santyl 08/18/2018 DOSE topical 250 unit/gram ointment - ointment topical applied nickel thick to the wound bed and then cover with a dressing as directed Electronic Signature(s) Signed: 08/18/2018 11:27:02 AM By: Worthy Keeler PA-C Entered By: Worthy Keeler on 08/18/2018 11:27:01 Larsen, [name] ([id number]) -------------------------------------------------------------------------------- Problem List Details Patient Name: [name] Date of Service: 08/18/2018 10:45 AM Medical Record Number: [id number] Patient Account Number: [id number] Date of Birth/Sex: [birth year] (85 y.o. F) Treating RN: [name] Primary Care Provider: Thereasa Distance Other Clinician: Referring Provider: Thereasa Distance Treating Provider/Extender: Melburn Hake, HOYT Weeks in Treatment: 11 Active Problems ICD-10 Evaluated Encounter Code Description Active Date Today Diagnosis I73.89 Other specified peripheral vascular diseases 06/02/2018 No Yes L97.818 Non-pressure chronic ulcer of other part of right lower leg 06/02/2018 No Yes with other specified severity N18.3 Chronic kidney disease, stage 3 (moderate) 06/02/2018 No Yes I48.20 Chronic atrial fibrillation, unspecified  06/02/2018 No Yes Inactive Problems Resolved Problems Electronic Signature(s) Signed: 08/18/2018 4:21:06 PM By: Worthy Keeler PA-C Entered By: Worthy Keeler on 08/18/2018 10:18:13 [place], [name] ([id number]) -------------------------------------------------------------------------------- Progress Note Details Patient Name: [name] Date of Service: 08/18/2018 10:45 AM Medical Record Number: [id number] Patient Account Number: [id number] Date of Birth/Sex: Jun 21, 1925 (85 y.o. F) Treating RN: [name] Primary Care Provider: Thereasa Distance Other Clinician: Referring Provider: Thereasa Distance Treating Provider/Extender: Melburn Hake, HOYT Weeks in Treatment: 11 Subjective Chief Complaint Information obtained from Patient Right Lower leg Achilles ulcer History of Present Illness (HPI) 06/02/18 patient presents today for initial evaluation our clinic concerning the ulcers that she has on the right Achilles location. 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 She was subsequently supposed to have had a scheduled likely stent placement in December 2019 unfortunately she had a fall with a sub dural hematoma which subsequently had to be evacuated partially at least. For that reason she has never gotten back in with Dr. Delana Meyer to have the vascular procedure. She does not have any appointment scheduled with him at this time. The patient does have a history of chronic kidney disease stage III, congestive heart failure, trophy relation, and at this point home health who has been coming out has been using [facility]. This is been since she's been home towards the end of December as best we can tell. She does have a hemoglobin A1c of 7.0 which was obtained on 11/18/17. Currently the patient's right lower extremity that showed  diminished blood flow according to her arterial study which is dated 02/27/18. At that point her right ABI was  0.89 with a TBI of 0.36 and her left ABI was 1.13 with a TBI of 0.50. Her prior study which was in May on the 13th 2019 showed that she had a right ABI of one and a right TBI of 0.51 with a left ABI of 0.72 and a TBI of 0.50. Obviously since May till now her right leg has diminished as far as the blood flows concerned patient's son who was with her today states that Dr. 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 I do believe that for limb salvage this patient needs to have the above stated procedure with Dr. Delana Meyer as soon as possible. 06/09/18 on evaluation today patient actually appears to be doing about the same in regard to her Achilles ulcer. This actually does have tended exposed however the tenant is remaining moist which is good news that's exactly what we want to see. Fortunately there's no signs of infection she does have an appointment with Dr. Delana Meyer this coming Tuesday, June 13, 2018. 06/16/18 on evaluation today patient actually appears to be doing rather well in regard to her right lower extremity at this point. The Achilles ulcer actually showed signs of being a little bit better as far as the overall appearance of the wound was concerned today. Fortunately there did not appear to be the evidence of infection at this time which is good news. With that being said she did have her angiogram where she did actually have a significant blockage of the 90% into her lower extremity. Fortunately between the balloon angioplasty followed by stating this was able to be improved to less than 5% residual stenosis according to Dr. Nino Parsley note that I read. She still may have some limitation into her foot in particular as far as the runoff is concerned but nonetheless in general appears to be doing much better which is great news. Fortunately there is no sign of infection at this time. 06/23/18 on evaluation today patient's wound bed actually appears to be doing about the  same although the central portion of the tendon actually appears to be somewhat dry this is definitely not good news. We want to try to keep this moist while allowing the edges of the wound hopefully start to cover the tendon that is the goal. Nonetheless I think that we may need to switch things up a little bit to try to improve the moisture trapping at this point. She does have better blood flow which is great we have applied for EpiFix although unfortunately as of right now this shows to be not covered. I think that we need to try to see about getting this approved as with the tenant expose me to do everything we can to try to get this covering of the skin as soon as possible. Again I'm not even sure that she'd be a candidate for plastic surgery intervention. 06/30/18 on evaluation today patient actually appears to be doing about the same in regard to her wound on the right Achilles region. The Achilles tendon is still exposed there does not appear to be any tissue growth around the edge while the tendon itself does not appear to be quite as dry as it was previous there's definitely necrosis of the tenant beginning to be noted unfortunately. For that reason I think that we really need to make an appointment for her  to see a specialist in order to discuss the options with her in regard to this one. Fortunately there does not appear to be any signs of systemic infection or even local [name], Kathleen J. ([id number]) infection for that matter. 07/07/18 on evaluation today patient appears to be doing about the same in regard to her Achilles region also. There does not appear to be any signs of active infection at this time which is good news. With that being said her Achilles tendon is very dry and appears to be more so due to the fact that apparently the last dressing that was put on was used hydrogel with a Dry gauze of the top not what had been previously recommended. For that reason again the tendon does  appear to be quite a bit more dry than what it was previous unfortunately. They still have not heard from orthopedics is for the referral for her Achilles region. We had made this referral last week on Tuesday the sign still hadn't heard anything and the office told him that they did not have a referral from [place] we therefore reset the referral then he still has not heard anything. 07/14/18 on evaluation today patient appears to be doing about the same in regard to her right Achilles ulcer. She has been tolerating the dressing changes without complication. With that being said nothing seems to really improve significantly in my pinion. She did see the orthopedic doctor earlier this week they recommended a referral to Dr. Vickki Muff her podiatrist for further evaluation as well as a potential referral to plastic surgery although that is not scheduled as of yet. The patient is scheduled to see her podiatrist on Monday. There's no signs of active infection at this time. 07/21/18 on evaluation today patient was actually seen on 07/17/18 by Dr. Vickki Muff who is a local podiatrist. Subsequently he did sharply debride in the office the wound to some degree to try to clear off the eschar and see how much of the tenant underlying was actually damaged. He however felt that with the patient really needed was more significant debridement of the Achilles tendon to clear this away and that a Wound VAC following. However he considers this a semi-elective surgery which subsequently the hospitals not allowing at this point. Nonetheless the patient son was present during the evaluation that she had there with the podiatrist and did we count that exactly what I was saying was exactly what he remembered as well. No fevers, chills, nausea, or vomiting noted at this time. 07/28/18 on evaluation today patient appears to be doing a little better in regard to the necrotic tendon loosen up on the surface of her wound. She's been tolerating  the dressing changes without complication. That does not appear to be any signs of active infection at this time which is good news. I'll be said that something we're trying to avoid. She does see Dr. Vickki Muff on Monday and just a couple of days. 08/04/18 on evaluation today patient appears to be doing about the same in regard to her wound on the posterior Achilles region. The tendon is still softening up but does not appear to show any signs of infection at this time. She did see her podiatrist on Monday he still feels like she needs surgery to remove the tendon in its entirety although right now again still with the hospital lockdown there's no indication that this can be done. Again this is due to the nature of surgeries that are allowed and  hers apparently does not fall in the category of essential. Nonetheless I think that this is something that needs to be done sooner rather than later based on what I'm seeing. 08/11/18 on evaluation today patient appears to be doing about the same that she does have some erythema around the edge of the wound. This is coupled with the fact she's had more drainage as well as increased pain all of which combine to make me concerned about the possibility of infection. Fortunately there's no signs of systemic infection. No fevers, chills, nausea, or vomiting noted at this time. 08/18/18 on evaluation today patient actually appears to be doing about the same though a little bit of the necrotic tendon is actually softening up with use of the Santyl. Fortunately there's no signs of active infection at this time. She's been tolerating the dressing changes without complication. Patient History Information obtained from Patient. Family History Cancer - Siblings, Heart Disease - Mother,Siblings,Father, Hypertension - Mother,Father,Siblings, Stroke - Father, No family history of Diabetes, Hereditary Spherocytosis, Kidney Disease, Lung Disease, Seizures, Thyroid  Problems, Tuberculosis. Social History Never smoker, Marital Status - Married, Alcohol Use - Never, Drug Use - No History, Caffeine Use - Daily. Medical History Eyes Denies history of Cataracts, Glaucoma [name], Kathleen J. ([id number]) Ear/Nose/Mouth/Throat Denies history of Chronic sinus problems/congestion, Middle ear problems Hematologic/Lymphatic Patient has history of Anemia Denies history of Hemophilia, Human Immunodeficiency Virus, Lymphedema, Sickle Cell Disease Respiratory Denies history of Aspiration, Asthma, Chronic Obstructive Pulmonary Disease (COPD), Pneumothorax, Sleep Apnea, Tuberculosis Cardiovascular Patient has history of Arrhythmia - a fib, Congestive Heart Failure, Hypertension Denies history of Angina, Coronary Artery Disease, Deep Vein Thrombosis, Hypotension, Myocardial Infarction, Peripheral Arterial Disease, Peripheral Venous Disease, Phlebitis, Vasculitis Gastrointestinal Denies history of Cirrhosis , Colitis, Crohn s, Hepatitis A, Hepatitis B, Hepatitis C Endocrine Patient has history of Type II Diabetes Denies history of Type I Diabetes Genitourinary Patient has history of End Stage Renal Disease - CKD stage 3 Immunological Denies history of Lupus Erythematosus, Raynaud s, Scleroderma Integumentary (Skin) Denies history of History of Burn, History of pressure wounds Musculoskeletal Patient has history of Osteoarthritis Denies history of Gout, Rheumatoid Arthritis, Osteomyelitis Neurologic Denies history of Dementia, Neuropathy, Quadriplegia, Paraplegia, Seizure Disorder Oncologic Denies history of Received Chemotherapy, Received Radiation Psychiatric Denies history of Anorexia/bulimia, Confinement Anxiety Medical And Surgical History Notes Cardiovascular HLD, mitral insufficiency Musculoskeletal 4 broken ribs Review of Systems (ROS) Constitutional Symptoms (General Health) Denies complaints or symptoms of Fatigue, Fever, Chills, Marked Weight  Change. Respiratory Denies complaints or symptoms of Chronic or frequent coughs, Shortness of Breath. Cardiovascular Denies complaints or symptoms of Chest pain, LE edema. Psychiatric Denies complaints or symptoms of Anxiety, Claustrophobia. Objective [name], Kathleen J. ([id number]) Constitutional Well-nourished and well-hydrated in no acute distress. Vitals Time Taken: 10:18 AM, Height: 63 in, Weight: 102 lbs, BMI: 18.1, Temperature: 97.7 F, Pulse: 65 bpm, Respiratory Rate: 16 breaths/min, Blood Pressure: 135/52 mmHg. Respiratory normal breathing without difficulty. clear to auscultation bilaterally. Cardiovascular regular rate and rhythm with normal S1, S2. Psychiatric this patient is able to make decisions and demonstrates good insight into disease process. Alert and Oriented x 3. pleasant and cooperative. General Notes: Patient's wound bed currently showed evidence of good skin around the edge there does not appear to be any evidence of infection at this time I believe the antibiotics have been helpful for her. With that being said she does seem to be experiencing some pain still again I think this is just due to the nature and  scope of the wound itself. Again I still think that you need surgical debridement with Dr. Vickki Muff which he of course has with me as well once and for backup for the surgery possibility. Integumentary (Hair, Skin) Wound #1 status is Open. Original cause of wound was Gradually Appeared. The wound is located on the Right Achilles. The wound measures 4.5cm length x 2.5cm width x 0.1cm depth; 8.836cm^2 area and 0.884cm^3 volume. There is tendon and Fat Layer (Subcutaneous Tissue) Exposed exposed. There is no tunneling or undermining noted. There is a large amount of serous drainage noted. The wound margin is flat and intact. There is no granulation within the wound bed. There is a large (67-100%) amount of necrotic tissue within the wound bed including Eschar and  Adherent Slough. The periwound skin appearance exhibited: Rubor. The periwound skin appearance did not exhibit: Callus, Crepitus, Excoriation, Induration, Rash, Scarring, Dry/Scaly, Maceration, Atrophie Blanche, Cyanosis, Ecchymosis, Hemosiderin Staining, Mottled, Pallor, Erythema. Periwound temperature was noted as No Abnormality. The periwound has tenderness on palpation. 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 There was a Chemical/Enzymatic/Mechanical debridement performed CHIARA, COLTRIN ([id number]) by [name], RN. With the following instrument(s): tongue blade after achieving pain control using Lidocaine 4% Topical Solution. Agent used was [employer]. A time out was conducted at 10:35, prior to the start of the procedure. There was no bleeding. The procedure was tolerated well with a pain level of 0 throughout and a pain level of 0 following the procedure. Post Debridement Measurements: 4.5cm length x 2.5cm width x 0.1cm depth; 0.884cm^3 volume. Character of Wound/Ulcer Post Debridement requires further debridement. Severity of Tissue Post Debridement is: Muscle involvement without necrosis. Post procedure Diagnosis Wound #1: Same as Pre-Procedure Plan Wound Cleansing: Wound #1 Right Achilles: Clean wound with Normal Saline. Primary Wound Dressing: Wound #1 Right Achilles: Santyl Ointment - to wound bed - HHRN to do this as well please Xeroform - over santyl to trap moisture on tendon Secondary Dressing: Wound #1 Right Achilles: ABD and Kerlix/Conform Dressing Change  Frequency: Wound #1 Right Achilles: Change dressing every day. Follow-up Appointments: Wound #1 Right Achilles: Return Appointment in 1 week. Home Health: Wound #1 Right Achilles: [place] Nurse may visit PRN to address patient s wound care needs. FACE TO FACE ENCOUNTER: MEDICARE and MEDICAID PATIENTS: I certify that this patient is under my care and that I had a face-to-face encounter that meets the physician face-to-face encounter requirements with this patient on this date. The encounter with the patient was in whole or in part for the following MEDICAL CONDITION: (primary reason for [place]) MEDICAL NECESSITY: I certify, that based on my findings, NURSING services are a medically necessary home health service. HOME BOUND STATUS: I certify that my clinical findings support that this patient is homebound (i.e., Due to illness or injury, pt requires aid of supportive devices such as crutches, cane, wheelchairs, walkers, the use of special transportation or the assistance of another person to leave their place of residence. There is a normal inability to leave the home and doing so requires considerable and taxing effort. Other absences are for medical reasons / religious services and are infrequent  or of short duration when for other reasons). If current dressing causes regression in wound condition, may D/C ordered dressing product/s and apply Normal Saline Moist Dressing daily until next [place] / Other MD appointment. [place] of regression in wound condition at [phone]. Please direct any NON-WOUND related issues/requests for orders to patient's Primary Care Physician Medications-please add to medication list.: Wound #1 Right Achilles: Santyl Enzymatic Ointment The following medication(s) was prescribed: Santyl topical 250 unit/gram ointment ointment topical applied nickel thick to the wound bed and then cover  with a dressing as directed starting 08/18/2018 [name], [name] ([id number]) My suggestion at this point is gonna be that we go ahead and continue with the above wound to measures for the next week and the patient is in agreement with plan. We will subsequently see were things stand at follow-up. If anything changes or worsens in the meantime he will contact the office and let me know. Please see above for specific wound care orders. We will see patient for re-evaluation in 1 week(s) here in the clinic. If anything worsens or changes patient will contact our office for additional recommendations. Electronic Signature(s) Signed: 08/18/2018 4:21:06 PM By: Worthy Keeler PA-C Entered By: Worthy Keeler on 08/18/2018 13:40:25 Scriven, [name] ([id number]) -------------------------------------------------------------------------------- ROS/PFSH Details Patient Name: [name] Date of Service: 08/18/2018 10:45 AM Medical Record Number: [id number] Patient Account Number: [id number] Date of Birth/Sex: May 27, 1925 (85 y.o. F) Treating RN: [name] Primary Care Provider: Thereasa Distance Other Clinician: Referring Provider: Thereasa Distance Treating Provider/Extender: Melburn Hake, HOYT Weeks in Treatment: 11 Information Obtained From Patient Constitutional Symptoms (General Health) Complaints and Symptoms: Negative for: Fatigue; Fever; Chills; Marked Weight Change Respiratory Complaints and Symptoms: Negative for: Chronic or frequent coughs; Shortness of Breath Medical History: Negative for: Aspiration; Asthma; Chronic Obstructive Pulmonary Disease (COPD); Pneumothorax; Sleep Apnea; Tuberculosis Cardiovascular Complaints and Symptoms: Negative for: Chest pain; LE edema Medical History: Positive for: Arrhythmia - a fib; Congestive Heart Failure; Hypertension Negative for: Angina; Coronary Artery Disease; Deep Vein Thrombosis; Hypotension; Myocardial Infarction; Peripheral Arterial Disease;  Peripheral Venous Disease; Phlebitis; Vasculitis Past Medical History Notes: HLD, mitral insufficiency Psychiatric Complaints and Symptoms: Negative for: Anxiety; Claustrophobia Medical History: Negative for: Anorexia/bulimia; Confinement Anxiety Eyes Medical History: Negative for: Cataracts; Glaucoma Ear/Nose/Mouth/Throat Medical History: Negative for: Chronic sinus problems/congestion; Middle ear problems Hematologic/Lymphatic Medical History: Positive for: Anemia [name], Kathleen J. ([id number]) Negative for: Hemophilia; Human Immunodeficiency Virus; Lymphedema; Sickle Cell Disease Gastrointestinal Medical History: Negative for: Cirrhosis ; Colitis; Crohnos; Hepatitis A; Hepatitis B; Hepatitis C Endocrine Medical History: Positive for: Type II Diabetes Negative for: Type I Diabetes Treated with: Insulin Blood sugar tested every day: Yes Tested : Genitourinary Medical History: Positive for: End Stage Renal Disease - CKD stage 3 Immunological Medical History: Negative for: Lupus Erythematosus; Raynaudos; Scleroderma Integumentary (Skin) Medical History: Negative for: History of Burn; History of pressure wounds Musculoskeletal Medical History: Positive for: Osteoarthritis Negative for: Gout; Rheumatoid Arthritis; Osteomyelitis Past Medical History Notes: 4 broken ribs Neurologic Medical History: Negative for: Dementia; Neuropathy; Quadriplegia; Paraplegia; Seizure Disorder Oncologic Medical History: Negative for: Received Chemotherapy; Received Radiation Immunizations Pneumococcal Vaccine: Received Pneumococcal Vaccination: Yes Immunization Notes: up to date Implantable Devices No devices added Family and Social History [name], [name] NAPPIER. ([id number]) Cancer: Yes - Siblings; Diabetes: No; Heart Disease: Yes - Mother,Siblings,Father; Hereditary Spherocytosis: No; Hypertension: Yes - Mother,Father,Siblings; Kidney Disease: No; Lung Disease: No; Seizures: No; Stroke: Yes  - Father; Thyroid Problems: No; Tuberculosis: No; Never  smoker; Marital Status - Married; Alcohol Use: Never; Drug Use: No History; Caffeine Use: Daily; Financial Concerns: No; Food, Clothing or Shelter Needs: No; Support System Lacking: No; Transportation Concerns: No Physician Affirmation I have reviewed and agree with the above information. Electronic Signature(s) Signed: 08/18/2018 4:21:06 PM By: Worthy Keeler PA-C Signed: 08/18/2018 4:32:22 PM By: [name] Entered By: Worthy Keeler on 08/18/2018 13:39:14 Rudnick, [name] ([id number]) -------------------------------------------------------------------------------- SuperBill Details Patient Name: [name] Date of Service: 08/18/2018 Medical Record Number: [id number] Patient Account Number: [id number] Date of Birth/Sex: 10-15-1925 (85 y.o. F) Treating RN: [name] Primary Care Provider: Thereasa Distance Other Clinician: Referring Provider: Thereasa Distance Treating Provider/Extender: Melburn Hake, HOYT Weeks in Treatment: 11 Diagnosis Coding ICD-10 Codes Code Description I73.89 Other specified peripheral vascular diseases L97.818 Non-pressure chronic ulcer of other part of right lower leg with other specified severity N18.3 Chronic kidney disease, stage 3 (moderate) I48.20 Chronic atrial fibrillation, unspecified Facility Procedures CPT4 Code: 38466599 Description: 35701 - DEBR[facility] W/O ANES NON SELECT Modifier: Quantity: 1 Physician Procedures CPT4 Code Description: 7793903 00923 - WC PHYS LEVEL 4 - EST PT ICD-10 Diagnosis Description I73.89 Other specified peripheral vascular diseases L97.818 Non-pressure chronic ulcer of other part of right lower leg wit N18.3 Chronic kidney disease, stage 3  (moderate) I48.20 Chronic atrial fibrillation, unspecified Modifier: h other specified Quantity: 1 severity Electronic Signature(s) Signed: 08/18/2018 4:21:06 PM By: Worthy Keeler PA-C Entered By: Worthy Keeler on 08/18/2018  13:40:41

## 2018-08-25 ENCOUNTER — Ambulatory Visit: Payer: Medicare Other | Admitting: Physician Assistant

## 2018-09-01 ENCOUNTER — Encounter: Payer: Medicare Other | Admitting: Physician Assistant

## 2018-09-01 ENCOUNTER — Other Ambulatory Visit: Payer: Self-pay

## 2018-09-01 DIAGNOSIS — E11622 Type 2 diabetes mellitus with other skin ulcer: Secondary | ICD-10-CM | POA: Diagnosis not present

## 2018-09-01 NOTE — Progress Notes (Signed)
ARITA, SEVERTSON ([id number]) Visit Report for 09/01/2018 Chief Complaint Document Details Patient Name: [name], [name] Date of Service: 09/01/2018 10:15 AM Medical Record Number: [id number] Patient Account Number: [id number] Date of Birth/Sex: 01-31-1926 (85 y.o. F) Treating RN: [name] Primary Care Provider: Thereasa Distance Other Clinician: Referring Provider: Thereasa Distance Treating Provider/Extender: Melburn Hake, HOYT Weeks in Treatment: 13 Information Obtained from: Patient Chief Complaint Right Lower leg Achilles ulcer Electronic Signature(s) Signed: 09/01/2018 11:21:54 AM By: Worthy Keeler PA-C Entered By: Worthy Keeler on 09/01/2018 10:30:44 Monestime, [name] ([id number]) -------------------------------------------------------------------------------- Debridement Details Patient Name: [name] Date of Service: 09/01/2018 10:15 AM Medical Record Number: [id number] Patient Account Number: [id number] Date of Birth/Sex: 01-09-1926 (85 y.o. F) Treating RN: [name] Primary Care Provider: Thereasa Distance Other Clinician: Referring Provider: Thereasa Distance Treating Provider/Extender: Melburn Hake, HOYT Weeks in Treatment: 13 Debridement Performed for Wound #1 Right Achilles Assessment: Performed By: Clinician Harold Barban, RN Debridement Type: Chemical/Enzymatic/Mechanical Agent Used: Santyl Severity of Tissue Pre Necrosis of muscle Debridement: Level of Consciousness (Pre- Awake and Alert procedure): Pre-procedure Verification/Time Yes - 10:36 Out Taken: Start Time: 10:36 Pain Control: Lidocaine 4% Topical Solution Instrument: Other : tongue blade Bleeding: None End Time: 10:37 Procedural Pain: 0 Post Procedural Pain: 0 Response to Treatment: Procedure was tolerated well Level of Consciousness Awake and Alert (Post-procedure): Post Debridement Measurements of Total Wound Length: (cm) 4 Width: (cm) 2.5 Depth: (cm) 0.1 Volume: (cm)  0.785 Character of Wound/Ulcer Post Debridement: Requires Further Debridement Severity of Tissue Post Debridement: Necrosis of muscle Post Procedure Diagnosis Same as Pre-procedure Electronic Signature(s) Signed: 09/01/2018 11:21:54 AM By: Worthy Keeler PA-C Signed: 09/01/2018 4:27:16 PM By: [name] Entered By: [name] on 09/01/2018 10:37:27 Skow, [name] ([id number]) -------------------------------------------------------------------------------- HPI Details Patient Name: [name] Date of Service: 09/01/2018 10:15 AM Medical Record Number: [id number] Patient Account Number: [id number] Date of Birth/Sex: [birth year] (85 y.o. F) Treating RN: [name] Primary Care Provider: Thereasa Distance Other Clinician: Referring Provider: Thereasa Distance Treating Provider/Extender: Melburn Hake, HOYT Weeks in Treatment: 13 History of Present Illness HPI Description: 06/02/18 patient presents today for initial evaluation our clinic concerning the ulcers that she has on the right Achilles location. 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 She was subsequently supposed to have had a scheduled likely stent placement in December 2019 unfortunately she had a fall with a sub dural hematoma which subsequently had to be evacuated partially at least. For that reason she has never gotten back in with Dr. Delana Meyer to have the vascular procedure. She does not have any appointment scheduled with him at this time. The patient does have a history of chronic kidney disease stage III, congestive heart failure, trophy relation, and at this point home health who has been coming out has been using [facility]. This is been since she's been home towards the end of December as best we can tell. She does have a hemoglobin A1c of 7.0 which was obtained on 11/18/17. Currently the patient's right lower extremity that showed diminished blood flow  according to her arterial study which is dated 02/27/18. At that point her right ABI was 0.89 with a TBI of 0.36 and her left ABI was 1.13 with a TBI of 0.50. Her prior study which was in May on the 13th 2019 showed that she had a right ABI of one and a right TBI of 0.51 with a left ABI of 0.72  and a TBI of 0.50. Obviously since May till now her right leg has diminished as far as the blood flows concerned patient's son who was with her today states that Dr. 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 I do believe that for limb salvage this patient needs to have the above stated procedure with Dr. Delana Meyer as soon as possible. 06/09/18 on evaluation today patient actually appears to be doing about the same in regard to her Achilles ulcer. This actually does have tended exposed however the tenant is remaining moist which is good news that's exactly what we want to see. Fortunately there's no signs of infection she does have an appointment with Dr. Delana Meyer this coming Tuesday, June 13, 2018. 06/16/18 on evaluation today patient actually appears to be doing rather well in regard to her right lower extremity at this point. The Achilles ulcer actually showed signs of being a little bit better as far as the overall appearance of the wound was concerned today. Fortunately there did not appear to be the evidence of infection at this time which is good news. With that being said she did have her angiogram where she did actually have a significant blockage of the 90% into her lower extremity. Fortunately between the balloon angioplasty followed by stating this was able to be improved to less than 5% residual stenosis according to Dr. Nino Parsley note that I read. She still may have some limitation into her foot in particular as far as the runoff is concerned but nonetheless in general appears to be doing much better which is great news. Fortunately there is no sign of infection at this  time. 06/23/18 on evaluation today patient's wound bed actually appears to be doing about the same although the central portion of the tendon actually appears to be somewhat dry this is definitely not good news. We want to try to keep this moist while allowing the edges of the wound hopefully start to cover the tendon that is the goal. Nonetheless I think that we may need to switch things up a little bit to try to improve the moisture trapping at this point. She does have better blood flow which is great we have applied for EpiFix although unfortunately as of right now this shows to be not covered. I think that we need to try to see about getting this approved as with the tenant expose me to do everything we can to try to get this covering of the skin as soon as possible. Again I'm not even sure that she'd be a candidate for plastic surgery intervention. 06/30/18 on evaluation today patient actually appears to be doing about the same in regard to her wound on the right Achilles region. The Achilles tendon is still exposed there does not appear to be any tissue growth around the edge while the tendon itself does not appear to be quite as dry as it was previous there's definitely necrosis of the tenant beginning to be noted unfortunately. For that reason I think that we really need to make an appointment for her to see a specialist in order to discuss the options with her in regard to this one. Fortunately there does not appear to be any signs of systemic infection or even local infection for that matter. 07/07/18 on evaluation today patient appears to be doing about the same in regard to her Achilles region also. There does not appear to be any signs of active infection at this time  which is good news. With that being said her Achilles tendon is very dry and appears to be more so due to the fact that apparently the last dressing that was put on was used hydrogel with a Dry gauze of the top not what had  been previously recommended. For that reason again the tendon does appear to be quite a bit more dry than what it was previous unfortunately. They still have not heard from orthopedics is for the referral for her Achilles region. We had made this referral last week on Tuesday the sign still hadn't heard anything and the office told him that they did not have a referral from [place] we therefore reset the referral then he still has not heard anything. 07/14/18 on evaluation today patient appears to be doing about the same in regard to her right Achilles ulcer. She has been tolerating the dressing changes without complication. With that being said nothing seems to really improve significantly in my pinion. She did see the orthopedic doctor earlier this week they recommended a referral to Dr. Vickki Muff her podiatrist for further evaluation as well as a potential referral to plastic surgery although that is not scheduled as of yet. The patient is scheduled to CHAYAH, MCKEE ([id number]) see her podiatrist on Monday. There's no signs of active infection at this time. 07/21/18 on evaluation today patient was actually seen on 07/17/18 by Dr. Vickki Muff who is a local podiatrist. Subsequently he did sharply debride in the office the wound to some degree to try to clear off the eschar and see how much of the tenant underlying was actually damaged. He however felt that with the patient really needed was more significant debridement of the Achilles tendon to clear this away and that a Wound VAC following. However he considers this a semi-elective surgery which subsequently the hospitals not allowing at this point. Nonetheless the patient son was present during the evaluation that she had there with the podiatrist and did we count that exactly what I was saying was exactly what he remembered as well. No fevers, chills, nausea, or vomiting noted at this time. 07/28/18 on evaluation today patient appears to be doing a little better  in regard to the necrotic tendon loosen up on the surface of her wound. She's been tolerating the dressing changes without complication. That does not appear to be any signs of active infection at this time which is good news. I'll be said that something we're trying to avoid. She does see Dr. Vickki Muff on Monday and just a couple of days. 08/04/18 on evaluation today patient appears to be doing about the same in regard to her wound on the posterior Achilles region. The tendon is still softening up but does not appear to show any signs of infection at this time. She did see her podiatrist on Monday he still feels like she needs surgery to remove the tendon in its entirety although right now again still with the hospital lockdown there's no indication that this can be done. Again this is due to the nature of surgeries that are allowed and hers apparently does not fall in the category of essential. Nonetheless I think that this is something that needs to be done sooner rather than later based on what I'm seeing. 08/11/18 on evaluation today patient appears to be doing about the same that she does have some erythema around the edge of the wound. This is coupled with the fact she's had more drainage as well as increased  pain all of which combine to make me concerned about the possibility of infection. Fortunately there's no signs of systemic infection. No fevers, chills, nausea, or vomiting noted at this time. 08/18/18 on evaluation today patient actually appears to be doing about the same though a little bit of the necrotic tendon is actually softening up with use of the Santyl. Fortunately there's no signs of active infection at this time. She's been tolerating the dressing changes without complication. 09/01/18 on evaluation today patient actually appears to be doing much better compared to what I've seen in the past out of her wound. I'm actually very pleased with the progress that she's made at this point  even compared to when I saw her last on the eighth. There is no signs of active infection at this time. No fevers, chills, nausea, or vomiting noted at this time. Electronic Signature(s) Signed: 09/01/2018 11:21:54 AM By: Worthy Keeler PA-C Entered By: Worthy Keeler on 09/01/2018 10:39:19 Gosnell, [name] ([id number]) -------------------------------------------------------------------------------- Physical Exam Details Patient Name: [name] Date of Service: 09/01/2018 10:15 AM Medical Record Number: [id number] Patient Account Number: [id number] Date of Birth/Sex: [birth year] (85 y.o. F) Treating RN: [name] Primary Care Provider: Thereasa Distance Other Clinician: Referring Provider: Thereasa Distance Treating Provider/Extender: Melburn Hake, HOYT Weeks in Treatment: 70 Constitutional Well-nourished and well-hydrated in no acute distress. Respiratory normal breathing without difficulty. clear to auscultation bilaterally. Cardiovascular regular rate and rhythm with normal S1, S2. Psychiatric this patient is able to make decisions and demonstrates good insight into disease process. Alert and Oriented x 3. pleasant and cooperative. Notes Patient's wound bed on inspection actually shown signs of good improvement compared to what we previously seen I do think that the Santyl has been helpful in this regard. Check she has the surgery scheduled for June 26 but there is at least some hope in my mind at this point that she may avoid the surgery if this continues to look better. Electronic Signature(s) Signed: 09/01/2018 11:21:54 AM By: Worthy Keeler PA-C Entered By: Worthy Keeler on 09/01/2018 10:42:01 Avakian, [name] ([id number]) -------------------------------------------------------------------------------- Physician Orders Details Patient Name: [name] Date of Service: 09/01/2018 10:15 AM Medical Record Number: [id number] Patient Account Number: [id number] Date of  Birth/Sex: 1925/04/22 (85 y.o. F) Treating RN: [name] Primary Care Provider: Thereasa Distance Other Clinician: Referring Provider: Thereasa Distance Treating Provider/Extender: Melburn Hake, HOYT Weeks in Treatment: 27 Verbal / Phone Orders: No Diagnosis Coding ICD-10 Coding Code Description I73.89 Other specified peripheral vascular diseases L97.818 Non-pressure chronic ulcer of other part of right lower leg with other specified severity N18.3 Chronic kidney disease, stage 3 (moderate) I48.20 Chronic atrial fibrillation, unspecified Wound Cleansing Wound #1 Right Achilles o Clean wound with Normal Saline. Primary Wound Dressing Wound #1 Right Achilles o Santyl Ointment - to wound bed - HHRN to do this as well please o Xeroform - over santyl to trap moisture on tendon Secondary Dressing Wound #1 Right Achilles o ABD and Kerlix/Conform Dressing Change Frequency Wound #1 Right Achilles o Change dressing every day. Follow-up Appointments Wound #1 Right Achilles o Return Appointment in 2 weeks. Home Health Wound #1 Right Achilles o Continue Home Health Visits - Mildred Nurse may visit PRN to address patientos wound care needs. o FACE TO FACE ENCOUNTER: MEDICARE and MEDICAID PATIENTS: I certify that this patient is under my care and that I had a face-to-face encounter that meets the physician face-to-face encounter requirements with this patient on  this date. The encounter with the patient was in whole or in part for the following MEDICAL CONDITION: (primary reason for Ali Chuk) MEDICAL NECESSITY: I certify, that based on my findings, NURSING services are a medically necessary home health service. HOME BOUND STATUS: I certify that my clinical findings support that this patient is homebound (i.e., Due to illness or injury, pt requires aid of supportive devices such as crutches, cane, wheelchairs, walkers, the use of special transportation  or the assistance of another person to leave their place of residence. There is a normal inability to leave the home and doing so requires considerable and taxing effort. Other absences are for medical reasons / religious services and are infrequent or of short duration when for other reasons). o If current dressing causes regression in wound condition, may D/C ordered dressing product/s and apply Normal Saline Moist Dressing daily until next [place] / Other MD appointment. [place] of regression in wound condition at [phone]. o Please direct any NON-WOUND related issues/requests for orders to patient's Primary Care Physician Medications-please add to medication list. Wound #1 Right Achilles Bobo, Nadie J. ([id number]) o Santyl Enzymatic Ointment Electronic Signature(s) Signed: 09/01/2018 11:21:54 AM By: Worthy Keeler PA-C Signed: 09/01/2018 4:27:16 PM By: [name] Entered By: [name] on 09/01/2018 10:36:01 Sens, [name] ([id number]) -------------------------------------------------------------------------------- Problem List Details Patient Name: [name] Date of Service: 09/01/2018 10:15 AM Medical Record Number: [id number] Patient Account Number: [id number] Date of Birth/Sex: [birth year] (85 y.o. F) Treating RN: [name] Primary Care Provider: Thereasa Distance Other Clinician: Referring Provider: Thereasa Distance Treating Provider/Extender: Melburn Hake, HOYT Weeks in Treatment: 13 Active Problems ICD-10 Evaluated Encounter Code Description Active Date Today Diagnosis I73.89 Other specified peripheral vascular diseases 06/02/2018 No Yes L97.818 Non-pressure chronic ulcer of other part of right lower leg 06/02/2018 No Yes with other specified severity N18.3 Chronic kidney disease, stage 3 (moderate) 06/02/2018 No Yes I48.20 Chronic atrial fibrillation, unspecified 06/02/2018 No Yes Inactive Problems Resolved  Problems Electronic Signature(s) Signed: 09/01/2018 11:21:54 AM By: Worthy Keeler PA-C Entered By: Worthy Keeler on 09/01/2018 10:30:38 Havener, [name] ([id number]) -------------------------------------------------------------------------------- Progress Note Details Patient Name: [name] Date of Service: 09/01/2018 10:15 AM Medical Record Number: [id number] Patient Account Number: [id number] Date of Birth/Sex: 07-31-1925 (85 y.o. F) Treating RN: [name] Primary Care Provider: Thereasa Distance Other Clinician: Referring Provider: Thereasa Distance Treating Provider/Extender: Melburn Hake, HOYT Weeks in Treatment: 13 Subjective Chief Complaint Information obtained from Patient Right Lower leg Achilles ulcer History of Present Illness (HPI) 06/02/18 patient presents today for initial evaluation our clinic concerning the ulcers that she has on the right Achilles location. 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 She was subsequently supposed to have had a scheduled likely stent placement in December 2019 unfortunately she had a fall with a sub dural hematoma which subsequently had to be evacuated partially at least. For that reason she has never gotten back in with Dr. Delana Meyer to have the vascular procedure. She does not have any appointment scheduled with him at this time. The patient does have a history of chronic kidney disease stage III, congestive heart failure, trophy relation, and at this point home health who has been coming out has been using [facility]. This is been since she's been home towards the end of December as best we can tell. She does have a hemoglobin A1c of 7.0 which was obtained on  11/18/17. Currently the patient's right lower extremity that showed diminished blood flow according to her arterial study which is dated 02/27/18. At that point her right ABI was 0.89 with a TBI of 0.36 and her left ABI  was 1.13 with a TBI of 0.50. Her prior study which was in May on the 13th 2019 showed that she had a right ABI of one and a right TBI of 0.51 with a left ABI of 0.72 and a TBI of 0.50. Obviously since May till now her right leg has diminished as far as the blood flows concerned patient's son who was with her today states that Dr. 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 I do believe that for limb salvage this patient needs to have the above stated procedure with Dr. Delana Meyer as soon as possible. 06/09/18 on evaluation today patient actually appears to be doing about the same in regard to her Achilles ulcer. This actually does have tended exposed however the tenant is remaining moist which is good news that's exactly what we want to see. Fortunately there's no signs of infection she does have an appointment with Dr. Delana Meyer this coming Tuesday, June 13, 2018. 06/16/18 on evaluation today patient actually appears to be doing rather well in regard to her right lower extremity at this point. The Achilles ulcer actually showed signs of being a little bit better as far as the overall appearance of the wound was concerned today. Fortunately there did not appear to be the evidence of infection at this time which is good news. With that being said she did have her angiogram where she did actually have a significant blockage of the 90% into her lower extremity. Fortunately between the balloon angioplasty followed by stating this was able to be improved to less than 5% residual stenosis according to Dr. Nino Parsley note that I read. She still may have some limitation into her foot in particular as far as the runoff is concerned but nonetheless in general appears to be doing much better which is great news. Fortunately there is no sign of infection at this time. 06/23/18 on evaluation today patient's wound bed actually appears to be doing about the same although the central portion  of the tendon actually appears to be somewhat dry this is definitely not good news. We want to try to keep this moist while allowing the edges of the wound hopefully start to cover the tendon that is the goal. Nonetheless I think that we may need to switch things up a little bit to try to improve the moisture trapping at this point. She does have better blood flow which is great we have applied for EpiFix although unfortunately as of right now this shows to be not covered. I think that we need to try to see about getting this approved as with the tenant expose me to do everything we can to try to get this covering of the skin as soon as possible. Again I'm not even sure that she'd be a candidate for plastic surgery intervention. 06/30/18 on evaluation today patient actually appears to be doing about the same in regard to her wound on the right Achilles region. The Achilles tendon is still exposed there does not appear to be any tissue growth around the edge while the tendon itself does not appear to be quite as dry as it was previous there's definitely necrosis of the tenant beginning to be noted unfortunately. For that reason I think that  we really need to make an appointment for her to see a specialist in order to discuss the options with her in regard to this one. Fortunately there does not appear to be any signs of systemic infection or even local infection for that matter. 07/07/18 on evaluation today patient appears to be doing about the same in regard to her Achilles region also. There does not appear to be any signs of active infection at this time which is good news. With that being said her Achilles tendon is very dry and appears to be more so due to the fact that apparently the last dressing that was put on was used hydrogel with a Dry gauze of the top not what had been previously recommended. For that reason again the tendon does appear to be quite a bit more dry than what it was previous  unfortunately. They still have not heard from orthopedics is for the referral for her Achilles region. We had made this referral last week on Tuesday the sign still hadn't heard anything and the office told him that they did not have a referral from [place] we therefore reset the referral then he still has not heard anything. AVAYAH, RAFFETY ([id number]) 07/14/18 on evaluation today patient appears to be doing about the same in regard to her right Achilles ulcer. She has been tolerating the dressing changes without complication. With that being said nothing seems to really improve significantly in my pinion. She did see the orthopedic doctor earlier this week they recommended a referral to Dr. Vickki Muff her podiatrist for further evaluation as well as a potential referral to plastic surgery although that is not scheduled as of yet. The patient is scheduled to see her podiatrist on Monday. There's no signs of active infection at this time. 07/21/18 on evaluation today patient was actually seen on 07/17/18 by Dr. Vickki Muff who is a local podiatrist. Subsequently he did sharply debride in the office the wound to some degree to try to clear off the eschar and see how much of the tenant underlying was actually damaged. He however felt that with the patient really needed was more significant debridement of the Achilles tendon to clear this away and that a Wound VAC following. However he considers this a semi-elective surgery which subsequently the hospitals not allowing at this point. Nonetheless the patient son was present during the evaluation that she had there with the podiatrist and did we count that exactly what I was saying was exactly what he remembered as well. No fevers, chills, nausea, or vomiting noted at this time. 07/28/18 on evaluation today patient appears to be doing a little better in regard to the necrotic tendon loosen up on the surface of her wound. She's been tolerating the dressing changes without  complication. That does not appear to be any signs of active infection at this time which is good news. I'll be said that something we're trying to avoid. She does see Dr. Vickki Muff on Monday and just a couple of days. 08/04/18 on evaluation today patient appears to be doing about the same in regard to her wound on the posterior Achilles region. The tendon is still softening up but does not appear to show any signs of infection at this time. She did see her podiatrist on Monday he still feels like she needs surgery to remove the tendon in its entirety although right now again still with the hospital lockdown there's no indication that this can be done. Again this is due  to the nature of surgeries that are allowed and hers apparently does not fall in the category of essential. Nonetheless I think that this is something that needs to be done sooner rather than later based on what I'm seeing. 08/11/18 on evaluation today patient appears to be doing about the same that she does have some erythema around the edge of the wound. This is coupled with the fact she's had more drainage as well as increased pain all of which combine to make me concerned about the possibility of infection. Fortunately there's no signs of systemic infection. No fevers, chills, nausea, or vomiting noted at this time. 08/18/18 on evaluation today patient actually appears to be doing about the same though a little bit of the necrotic tendon is actually softening up with use of the Santyl. Fortunately there's no signs of active infection at this time. She's been tolerating the dressing changes without complication. 09/01/18 on evaluation today patient actually appears to be doing much better compared to what I've seen in the past out of her wound. I'm actually very pleased with the progress that she's made at this point even compared to when I saw her last on the eighth. There is no signs of active infection at this time. No fevers, chills,  nausea, or vomiting noted at this time. Patient History Information obtained from Patient. Family History Cancer - Siblings, Heart Disease - Mother,Siblings,Father, Hypertension - Mother,Father,Siblings, Stroke - Father, No family history of Diabetes, Hereditary Spherocytosis, Kidney Disease, Lung Disease, Seizures, Thyroid Problems, Tuberculosis. Social History Never smoker, Marital Status - Married, Alcohol Use - Never, Drug Use - No History, Caffeine Use - Daily. Medical History Eyes Denies history of Cataracts, Glaucoma Ear/Nose/Mouth/Throat Denies history of Chronic sinus problems/congestion, Middle ear problems Hematologic/Lymphatic Patient has history of Anemia Denies history of Hemophilia, Human Immunodeficiency Virus, Lymphedema, Sickle Cell Disease Respiratory Denies history of Aspiration, Asthma, Chronic Obstructive Pulmonary Disease (COPD), Pneumothorax, Sleep Apnea, Tuberculosis Cardiovascular Patient has history of Arrhythmia - a fib, Congestive Heart Failure, Hypertension Denies history of Angina, Coronary Artery Disease, Deep Vein Thrombosis, Hypotension, Myocardial Infarction, Peripheral Arterial Disease, Peripheral Venous Disease, Phlebitis, Vasculitis Gastrointestinal Karner, Lawson J. ([id number]) Denies history of Cirrhosis , Colitis, Crohn s, Hepatitis A, Hepatitis B, Hepatitis C Endocrine Patient has history of Type II Diabetes Denies history of Type I Diabetes Genitourinary Patient has history of End Stage Renal Disease - CKD stage 3 Immunological Denies history of Lupus Erythematosus, Raynaud s, Scleroderma Integumentary (Skin) Denies history of History of Burn, History of pressure wounds Musculoskeletal Patient has history of Osteoarthritis Denies history of Gout, Rheumatoid Arthritis, Osteomyelitis Neurologic Denies history of Dementia, Neuropathy, Quadriplegia, Paraplegia, Seizure Disorder Oncologic Denies history of Received Chemotherapy, Received  Radiation Psychiatric Denies history of Anorexia/bulimia, Confinement Anxiety Medical And Surgical History Notes Cardiovascular HLD, mitral insufficiency Musculoskeletal 4 broken ribs Review of Systems (ROS) Constitutional Symptoms (General Health) Denies complaints or symptoms of Fatigue, Fever, Chills, Marked Weight Change. Respiratory Denies complaints or symptoms of Chronic or frequent coughs, Shortness of Breath. Cardiovascular Denies complaints or symptoms of Chest pain, LE edema. Psychiatric Denies complaints or symptoms of Anxiety, Claustrophobia. Objective Constitutional Well-nourished and well-hydrated in no acute distress. Vitals Time Taken: 10:22 AM, Height: 63 in, Weight: 102 lbs, BMI: 18.1, Temperature: 97.8 F, Pulse: 64 bpm, Respiratory Rate: 16 breaths/min, Blood Pressure: 115/48 mmHg. Respiratory normal breathing without difficulty. clear to auscultation bilaterally. Cardiovascular regular rate and rhythm with normal S1, S2. Psychiatric this patient is able to make decisions and demonstrates  good insight into disease process. Alert and Oriented x 3. pleasant and cooperative. General Notes: Patient's wound bed on inspection actually shown signs of good improvement compared to what we previously seen I do think that the Santyl has been helpful in this regard. Check she has the surgery scheduled for June 26 but there is at least some hope in my mind at this point that she may avoid the surgery if this continues to look better. SOMALIA, SEGLER. ([id number]) Integumentary (Hair, Skin) Wound #1 status is Open. Original cause of wound was Gradually Appeared. The wound is located on the Right Achilles. The wound measures 4cm length x 2.5cm width x 0.1cm depth; 7.854cm^2 area and 0.785cm^3 volume. There is tendon and Fat Layer (Subcutaneous Tissue) Exposed exposed. There is no tunneling or undermining noted. There is a large amount of serous drainage noted. The wound  margin is flat and intact. There is no granulation within the wound bed. There is a large (67-100%) amount of necrotic tissue within the wound bed including Eschar and Adherent Slough. 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 There was a Chemical/Enzymatic/Mechanical debridement performed by Harold Barban, RN. With the following instrument(s): tongue blade after achieving pain control using Lidocaine 4% Topical Solution. Agent used was [employer]. A time out was conducted at 10:36, prior to the start of the procedure. There was no bleeding. The procedure was tolerated well with a pain level of 0 throughout and a pain level of 0 following the procedure. Post Debridement Measurements: 4cm length x 2.5cm width x 0.1cm depth; 0.785cm^3 volume. Character of Wound/Ulcer Post Debridement requires further debridement. Severity of Tissue Post Debridement is: Necrosis of muscle. Post procedure Diagnosis Wound #1: Same as Pre-Procedure Plan Wound Cleansing: Wound #1 Right Achilles: Clean wound with Normal Saline. Primary Wound Dressing: Wound #1 Right Achilles: Santyl Ointment - to wound bed - HHRN to do this as well please Xeroform - over santyl to trap moisture on tendon Secondary Dressing: Wound #1 Right Achilles: ABD and Kerlix/Conform Dressing Change Frequency: Wound #1 Right Achilles: Change dressing every day. Follow-up Appointments: Wound #1 Right Achilles: Return Appointment in 2 weeks. Home Health: Wound #1 Right Achilles: Continue Home Health Visits - [facility] Nurse may visit PRN  to address patient s wound care needs. FACE TO FACE ENCOUNTER: MEDICARE and MEDICAID PATIENTS: I certify that this patient is under my care and that I Cardona, Leaann J. ([id number]) had a face-to-face encounter that meets the physician face-to-face encounter requirements with this patient on this date. The encounter with the patient was in whole or in part for the following MEDICAL CONDITION: (primary reason for [place]) MEDICAL NECESSITY: I certify, that based on my findings, NURSING services are a medically necessary home health service. HOME BOUND STATUS: I certify that my clinical findings support that this patient is homebound (i.e., Due to illness or injury, pt requires aid of supportive devices such as crutches, cane, wheelchairs, walkers, the use of special transportation or the assistance of another person to leave their place of residence. There is a normal inability to leave the home and doing so requires considerable and taxing effort. Other absences are for medical reasons /  religious services and are infrequent or of short duration when for other reasons). If current dressing causes regression in wound condition, may D/C ordered dressing product/s and apply Normal Saline Moist Dressing daily until next [place] / Other MD appointment. Diller of regression in wound condition at [phone]. Please direct any NON-WOUND related issues/requests for orders to patient's Primary Care Physician Medications-please add to medication list.: Wound #1 Right Achilles: Santyl Enzymatic Ointment At this point I'm gonna suggest that we continue with the Current wound care measures since she seems to be doing so well. Patient is in agreement with plan. If anything changes worsens meantime she will contact the office and let me know. Please see above for specific wound care orders. We will see patient for re-evaluation in two week(s) here in the clinic.  If anything worsens or changes patient will contact our office for additional recommendations. Electronic Signature(s) Signed: 09/01/2018 11:21:54 AM By: Worthy Keeler PA-C Entered By: Worthy Keeler on 09/01/2018 10:42:30 Silguero, [name] ([id number]) -------------------------------------------------------------------------------- ROS/PFSH Details Patient Name: [name] Date of Service: 09/01/2018 10:15 AM Medical Record Number: [id number] Patient Account Number: [id number] Date of Birth/Sex: 1925/11/05 (85 y.o. F) Treating RN: [name] Primary Care Provider: Thereasa Distance Other Clinician: Referring Provider: Thereasa Distance Treating Provider/Extender: Melburn Hake, HOYT Weeks in Treatment: 13 Information Obtained From Patient Constitutional Symptoms (General Health) Complaints and Symptoms: Negative for: Fatigue; Fever; Chills; Marked Weight Change Respiratory Complaints and Symptoms: Negative for: Chronic or frequent coughs; Shortness of Breath Medical History: Negative for: Aspiration; Asthma; Chronic Obstructive Pulmonary Disease (COPD); Pneumothorax; Sleep Apnea; Tuberculosis Cardiovascular Complaints and Symptoms: Negative for: Chest pain; LE edema Medical History: Positive for: Arrhythmia - a fib; Congestive Heart Failure; Hypertension Negative for: Angina; Coronary Artery Disease; Deep Vein Thrombosis; Hypotension; Myocardial Infarction; Peripheral Arterial Disease; Peripheral Venous Disease; Phlebitis; Vasculitis Past Medical History Notes: HLD, mitral insufficiency Psychiatric Complaints and Symptoms: Negative for: Anxiety; Claustrophobia Medical History: Negative for: Anorexia/bulimia; Confinement Anxiety Eyes Medical History: Negative for: Cataracts; Glaucoma Ear/Nose/Mouth/Throat Medical History: Negative for: Chronic sinus problems/congestion; Middle ear problems Hematologic/Lymphatic Medical History: Positive for: Anemia Negative for:  Hemophilia; Human Immunodeficiency Virus; Lymphedema; Sickle Cell Disease Gastrointestinal Medical History: Negative for: Cirrhosis ; Colitis; Crohnos; Hepatitis A; Hepatitis B; Hepatitis C Endocrine Kenan, Shamikia J. ([id number]) Medical History: Positive for: Type II Diabetes Negative for: Type I Diabetes Treated with: Insulin Blood sugar tested every day: Yes Tested : Genitourinary Medical History: Positive for: End Stage Renal Disease - CKD stage 3 Immunological Medical History: Negative for: Lupus Erythematosus; Raynaudos; Scleroderma Integumentary (Skin) Medical History: Negative for: History of Burn; History of pressure wounds Musculoskeletal Medical History: Positive for: Osteoarthritis Negative for: Gout; Rheumatoid Arthritis; Osteomyelitis Past Medical History Notes: 4 broken ribs Neurologic Medical History: Negative for: Dementia; Neuropathy; Quadriplegia; Paraplegia; Seizure Disorder Oncologic Medical History: Negative for: Received Chemotherapy; Received Radiation Immunizations Pneumococcal Vaccine: Received Pneumococcal Vaccination: Yes Immunization Notes: up to date Implantable Devices No devices added Family and Social History Cancer: Yes - Siblings; Diabetes: No; Heart Disease: Yes - Mother,Siblings,Father; Hereditary Spherocytosis: No; Hypertension: Yes - Mother,Father,Siblings; Kidney Disease: No; Lung Disease: No; Seizures: No; Stroke: Yes - Father; Thyroid Problems: No; Tuberculosis: No; Never smoker; Marital Status - Married; Alcohol Use: Never; Drug Use: No History; Caffeine Use: Daily; Financial Concerns: No; Food, Clothing or Shelter Needs: No; Support System Lacking: No; Transportation Concerns: No Physician Affirmation I have reviewed and agree with the above information. Electronic Signature(s) Signed: 09/01/2018 11:21:54 AM  By: Worthy Keeler PA-C Signed: 09/01/2018 4:27:16 PM By: [name] Entered By: Worthy Keeler on 09/01/2018  10:39:34 Olaes, [name] ([id number]) -------------------------------------------------------------------------------- SuperBill Details Patient Name: [name] Date of Service: 09/01/2018 Medical Record Number: [id number] Patient Account Number: [id number] Date of Birth/Sex: [birth year] (85 y.o. F) Treating RN: [name] Primary Care Provider: Thereasa Distance Other Clinician: Referring Provider: Thereasa Distance Treating Provider/Extender: Melburn Hake, HOYT Weeks in Treatment: 13 Diagnosis Coding ICD-10 Codes Code Description I73.89 Other specified peripheral vascular diseases L97.818 Non-pressure chronic ulcer of other part of right lower leg with other specified severity N18.3 Chronic kidney disease, stage 3 (moderate) I48.20 Chronic atrial fibrillation, unspecified Facility Procedures CPT4 Code: 41660630 Description: 16010 - DEBRIDE W/O ANES NON SELECT Modifier: Quantity: 1 Physician Procedures CPT4 Code Description: 9323557 32202 - WC PHYS LEVEL 4 - EST PT ICD-10 Diagnosis Description I73.89 Other specified peripheral vascular diseases L97.818 Non-pressure chronic ulcer of other part of right lower leg wit N18.3 Chronic kidney disease, stage 3  (moderate) I48.20 Chronic atrial fibrillation, unspecified Modifier: h other specified Quantity: 1 severity Electronic Signature(s) Signed: 09/01/2018 11:21:54 AM By: Worthy Keeler PA-C Entered By: Worthy Keeler on 09/01/2018 10:42:44

## 2018-09-01 NOTE — Progress Notes (Signed)
[name], [name] ([id number]) Visit Report for 09/01/2018 Arrival Information Details Patient Name: [name], [name] Date of Service: 09/01/2018 10:15 AM Medical Record Number: [id number] Patient Account Number: [id number] Date of Birth/Sex: 19-May-1925 (85 y.o. F) Treating RN: [name] Primary Care Shalik Sanfilippo: Thereasa Distance Other Clinician: Referring Syd Manges: Thereasa Distance Treating Eirik Schueler/Extender: Melburn Hake, HOYT Weeks in Treatment: 13 Visit Information History Since Last Visit Added or deleted any medications: No Patient Arrived: Wheel Chair Any new allergies or adverse reactions: No Arrival Time: 10:20 Had a fall or experienced change in No Accompanied By: son activities of daily living that may affect Transfer Assistance: None risk of falls: Patient Identification Verified: Yes Signs or symptoms of abuse/neglect since last visito No Secondary Verification Process Completed: Yes Hospitalized since last visit: No Patient Has Alerts: Yes Implantable device outside of the clinic excluding No Patient Alerts: DMII cellular tissue based products placed in the center since last visit: Has Dressing in Place as Prescribed: Yes Pain Present Now: No Electronic Signature(s) Signed: 09/01/2018 2:37:19 PM By: Lorine Bears RCP, RRT, CHT Entered By: Lorine Bears on 09/01/2018 10:21:47 [name], [name] ([id number]) -------------------------------------------------------------------------------- Encounter Discharge Information Details Patient Name: [name] Date of Service: 09/01/2018 10:15 AM Medical Record Number: [id number] Patient Account Number: [id number] Date of Birth/Sex: 05-07-1925 (85 y.o. F) Treating RN: [name] Primary Care Suhayb Anzalone: Thereasa Distance Other Clinician: Referring Shanekqua Schaper: Thereasa Distance Treating Larayne Baxley/Extender: Melburn Hake, HOYT Weeks in Treatment: 52 Encounter Discharge Information Items Post Procedure  Vitals Discharge Condition: Stable Temperature (F): 97.8 Ambulatory Status: Wheelchair Pulse (bpm): 67 Discharge Destination: Home Respiratory Rate (breaths/min): 16 Transportation: Private Auto Blood Pressure (mmHg): 115/48 Accompanied By: son Schedule Follow-up Appointment: Yes Clinical Summary of Care: Electronic Signature(s) Signed: 09/01/2018 4:27:16 PM By: [name] Entered By: [name] on 09/01/2018 10:38:48 Maddix, [name] ([id number]) -------------------------------------------------------------------------------- Lower Extremity Assessment Details Patient Name: [name] Date of Service: 09/01/2018 10:15 AM Medical Record Number: [id number] Patient Account Number: [id number] Date of Birth/Sex: 15-Nov-1925 (85 y.o. F) Treating RN: Harold Barban Primary Care Areen Trautner: Thereasa Distance Other Clinician: Referring Burney Calzadilla: Thereasa Distance Treating Aries Kasa/Extender: Melburn Hake, HOYT Weeks in Treatment: 13 Vascular Assessment Pulses: Dorsalis Pedis Palpable: [Left:Yes] Posterior Tibial Palpable: [Left:Yes] Electronic Signature(s) Signed: 09/01/2018 4:11:00 PM By: Harold Barban Entered By: Harold Barban on 09/01/2018 10:28:13 Quilter, [name] ([id number]) -------------------------------------------------------------------------------- Multi Wound Chart Details Patient Name: [name] Date of Service: 09/01/2018 10:15 AM Medical Record Number: [id number] Patient Account Number: [id number] Date of Birth/Sex: 1925-12-18 (85 y.o. F) Treating RN: [name] Primary Care Ej Pinson: Thereasa Distance Other Clinician: Referring Sophiamarie Nease: Thereasa Distance Treating Cephus Tupy/Extender: Melburn Hake, HOYT Weeks in Treatment: 13 Vital Signs Height(in): 63 Pulse(bpm): 64 Weight(lbs): 102 Blood Pressure(mmHg): 115/48 Body Mass Index(BMI): 18 Temperature(F): 97.8 Respiratory Rate 16 (breaths/min): Photos: [N/A:N/A] Wound Location: Right Achilles N/A  N/A Wounding Event: Gradually Appeared N/A N/A Primary Etiology: Arterial Insufficiency Ulcer N/A N/A Secondary Etiology: Diabetic Wound/Ulcer of the N/A N/A Lower Extremity Comorbid History: Anemia, Arrhythmia, N/A N/A Congestive Heart Failure, Hypertension, Type II Diabetes, End Stage Renal Disease, Osteoarthritis Date Acquired: 04/01/2018 N/A N/A Weeks of Treatment: 13 N/A N/A Wound Status: Open N/A N/A Pending Amputation on Yes N/A N/A Presentation: Measurements L x W x D 4x2.5x0.1 N/A N/A (cm) Area (cm) : 7.854 N/A N/A Volume (cm) : 0.785 N/A N/A % Reduction in Area: -64.50% N/A N/A % Reduction in Volume: -64.20% N/A N/A Classification: Full Thickness With Exposed N/A N/A Support Structures Exudate Amount: Large N/A  N/A Exudate Type: Serous N/A N/A Exudate Color: amber N/A N/A Wound Margin: Flat and Intact N/A N/A Granulation Amount: None Present (0%) N/A N/A Necrotic Amount: Large (67-100%) N/A N/A Necrotic Tissue: Eschar, Adherent Slough N/A N/A Exposed Structures: Fat Layer (Subcutaneous N/A N/A Tissue) Exposed: Yes Tendon: Yes Fascia: No Muscle: No Joint: No Bone: No [name], Kathleen J. ([id number]) Epithelialization: None N/A N/A Treatment Notes Electronic Signature(s) Signed: 09/01/2018 4:27:16 PM By: [name] Entered By: [name] on 09/01/2018 10:32:11 [name], [name] ([id number]) -------------------------------------------------------------------------------- Multi-Disciplinary Care Plan Details Patient Name: [name] Date of Service: 09/01/2018 10:15 AM Medical Record Number: [id number] Patient Account Number: [id number] Date of Birth/Sex: February 05, 1926 (85 y.o. F) Treating RN: [name] Primary Care Tiea Manninen: Thereasa Distance Other Clinician: Referring Nicanor Mendolia: Thereasa Distance Treating Drayke Grabel/Extender: Melburn Hake, HOYT Weeks in Treatment: 41 Active Inactive Abuse / Safety / Falls / Self Care Management Nursing  Diagnoses: Potential for falls Goals: Patient will not experience any injury related to falls Date Initiated: 06/16/2018 Target Resolution Date: 09/16/2018 Goal Status: Active Interventions: Assess fall risk on admission and as needed Notes: Necrotic Tissue Nursing Diagnoses: Knowledge deficit related to management of necrotic/devitalized tissue Goals: Patient/caregiver will verbalize understanding of reason and process for debridement of necrotic tissue Date Initiated: 06/16/2018 Target Resolution Date: 09/16/2018 Goal Status: Active Interventions: Provide education on necrotic tissue and debridement process Notes: Nutrition Nursing Diagnoses: Imbalanced nutrition Goals: Patient/caregiver agrees to and verbalizes understanding of need to use nutritional supplements and/or vitamins as prescribed Date Initiated: 06/16/2018 Target Resolution Date: 09/16/2018 Goal Status: Active Interventions: Assess patient nutrition upon admission and as needed per policy Notes: Wound/Skin Impairment Nursing Diagnoses: Impaired tissue integrity Knowledge deficit related to ulceration/compromised skin integrity [name], Kathleen J. ([id number]) Goals: Ulcer/skin breakdown will have a volume reduction of 30% by week 4 Date Initiated: 06/02/2018 Target Resolution Date: 07/01/2018 Goal Status: Active Interventions: Assess patient/caregiver ability to obtain necessary supplies Assess patient/caregiver ability to perform ulcer/skin care regimen upon admission and as needed Assess ulceration(s) every visit Notes: Electronic Signature(s) Signed: 09/01/2018 4:27:16 PM By: [name] Entered By: [name] on 09/01/2018 10:32:05 [name], [name] ([id number]) -------------------------------------------------------------------------------- Pain Assessment Details Patient Name: [name] Date of Service: 09/01/2018 10:15 AM Medical Record Number: [id number] Patient Account Number: [id number] Date of  Birth/Sex: 1926/01/17 (85 y.o. F) Treating RN: [name] Primary Care Advaith Lamarque: Thereasa Distance Other Clinician: Referring Lakiah Dhingra: Thereasa Distance Treating Tijana Walder/Extender: Melburn Hake, HOYT Weeks in Treatment: 13 Active Problems Location of Pain Severity and Description of Pain Patient Has Paino No Site Locations Pain Management and Medication Current Pain Management: Electronic Signature(s) Signed: 09/01/2018 2:37:19 PM By: Paulla Fore, RRT, CHT Signed: 09/01/2018 4:27:16 PM By: [name] Entered By: Lorine Bears on 09/01/2018 10:21:57 [name], [name] ([id number]) -------------------------------------------------------------------------------- Patient/Caregiver Education Details Patient Name: [name] Date of Service: 09/01/2018 10:15 AM Medical Record Number: [id number] Patient Account Number: [id number] Date of Birth/Gender: 08-04-1925 (85 y.o. F) Treating RN: [name] Primary Care Physician: Thereasa Distance Other Clinician: Referring Physician: Thereasa Distance Treating Physician/Extender: Sharalyn Ink in Treatment: 13 Education Assessment Education Provided To: Patient and Caregiver Education Topics Provided Wound/Skin Impairment: Handouts: Other: santyl wound care Methods: Demonstration, Explain/Verbal Responses: State content correctly Electronic Signature(s) Signed: 09/01/2018 4:27:16 PM By: [name] Entered By: [name] on 09/01/2018 10:37:57 [name], [name] ([id number]) -------------------------------------------------------------------------------- Wound Assessment Details Patient Name: [name] Date of Service: 09/01/2018 10:15 AM Medical Record Number: [id number] Patient Account Number: [id number] Date  of Birth/Sex: [birth year] (85 y.o. 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 Wounding Event: Gradually Appeared Etiology: Date Acquired: 04/01/2018 Wound Open Weeks Of Treatment: 13 Status: Clustered Wound: No Comorbid Anemia, Arrhythmia, Congestive Heart Failure, Pending Amputation On Presentation History: Hypertension, Type II Diabetes, End Stage Renal Disease, Osteoarthritis Photos Wound Measurements Length: (cm) 4 Width: (cm) 2.5 Depth: (cm) 0.1 Area: (cm) 7.854 Volume: (cm) 0.785 % Reduction in Area: -64.5% % Reduction in Volume: -64.2% Epithelialization: None Tunneling: No Undermining: No Wound Description Full Thickness With Exposed Support Classification: Structures Wound Margin: Flat and Intact Exudate Large Amount: Exudate Type: Serous Exudate Color: amber Foul Odor After Cleansing: No Slough/Fibrino Yes Wound Bed Granulation Amount: None Present (0%) Exposed Structure Necrotic Amount: Large (67-100%) Fascia Exposed: No Necrotic Quality: Eschar, Adherent Slough Fat Layer (Subcutaneous Tissue) Exposed: Yes Tendon Exposed: Yes Muscle Exposed: No Joint Exposed: No Bone Exposed: No Treatment Notes Wound #1 (Right Achilles) Notes santyl, xeroform, abd, conform to secure [name], [name] ([id number]) Electronic Signature(s) Signed: 09/01/2018 4:11:00 PM By: Harold Barban Entered By: Harold Barban on 09/01/2018 10:27:53 [name], [name] ([id number]) -------------------------------------------------------------------------------- Vitals Details Patient Name: [name] Date of Service: 09/01/2018 10:15 AM Medical Record Number: [id number] Patient Account Number: [id number] Date of Birth/Sex: February 25, 1926 (85 y.o. F) Treating RN: [name] Primary Care Jamare Vanatta: Thereasa Distance Other  Clinician: Referring Comer Devins: Thereasa Distance Treating Noble Cicalese/Extender: Melburn Hake, HOYT Weeks in Treatment: 13 Vital Signs Time Taken: 10:22 Temperature (F): 97.8 Height (in): 63 Pulse (bpm): 64 Weight (lbs): 102 Respiratory Rate (breaths/min): 16 Body Mass Index (BMI): 18.1 Blood Pressure (mmHg): 115/48 Reference Range: 80 - 120 mg / dl Electronic Signature(s) Signed: 09/01/2018 2:37:19 PM By: Lorine Bears RCP, RRT, CHT Entered By: Lorine Bears on 09/01/2018 10:26:13

## 2018-09-15 ENCOUNTER — Other Ambulatory Visit: Payer: Self-pay

## 2018-09-15 ENCOUNTER — Encounter: Payer: Medicare Other | Attending: Physician Assistant | Admitting: Physician Assistant

## 2018-09-15 DIAGNOSIS — E11622 Type 2 diabetes mellitus with other skin ulcer: Secondary | ICD-10-CM | POA: Diagnosis not present

## 2018-09-15 DIAGNOSIS — Z823 Family history of stroke: Secondary | ICD-10-CM | POA: Diagnosis not present

## 2018-09-15 DIAGNOSIS — I132 Hypertensive heart and chronic kidney disease with heart failure and with stage 5 chronic kidney disease, or end stage renal disease: Secondary | ICD-10-CM | POA: Diagnosis not present

## 2018-09-15 DIAGNOSIS — N186 End stage renal disease: Secondary | ICD-10-CM | POA: Insufficient documentation

## 2018-09-15 DIAGNOSIS — L97919 Non-pressure chronic ulcer of unspecified part of right lower leg with unspecified severity: Secondary | ICD-10-CM | POA: Diagnosis not present

## 2018-09-15 DIAGNOSIS — E1122 Type 2 diabetes mellitus with diabetic chronic kidney disease: Secondary | ICD-10-CM | POA: Insufficient documentation

## 2018-09-15 DIAGNOSIS — Z992 Dependence on renal dialysis: Secondary | ICD-10-CM | POA: Diagnosis not present

## 2018-09-15 DIAGNOSIS — I482 Chronic atrial fibrillation, unspecified: Secondary | ICD-10-CM | POA: Insufficient documentation

## 2018-09-15 DIAGNOSIS — M1990 Unspecified osteoarthritis, unspecified site: Secondary | ICD-10-CM | POA: Insufficient documentation

## 2018-09-15 DIAGNOSIS — I340 Nonrheumatic mitral (valve) insufficiency: Secondary | ICD-10-CM | POA: Insufficient documentation

## 2018-09-15 DIAGNOSIS — Z809 Family history of malignant neoplasm, unspecified: Secondary | ICD-10-CM | POA: Diagnosis not present

## 2018-09-15 DIAGNOSIS — I509 Heart failure, unspecified: Secondary | ICD-10-CM | POA: Insufficient documentation

## 2018-09-15 DIAGNOSIS — Z8249 Family history of ischemic heart disease and other diseases of the circulatory system: Secondary | ICD-10-CM | POA: Insufficient documentation

## 2018-09-15 DIAGNOSIS — E785 Hyperlipidemia, unspecified: Secondary | ICD-10-CM | POA: Diagnosis not present

## 2018-09-15 NOTE — Progress Notes (Signed)
Kathleen, Carlson ([id number]) Visit Report for 09/15/2018 Arrival Information Details Patient Name: [name], [name] Date of Service: 09/15/2018 10:30 AM Medical Record Number: [id number] Patient Account Number: [id number] Date of Birth/Sex: October 16, 1925 (85 y.o. F) Treating RN: Harold Barban Primary Care Maryhelen Lindler: Thereasa Distance Other Clinician: Referring Edie Darley: Thereasa Distance Treating Aaliyan Brinkmeier/Extender: Melburn Hake, HOYT Weeks in Treatment: 15 Visit Information History Since Last Visit Added or deleted any medications: No Patient Arrived: Wheel Chair Any new allergies or adverse reactions: No Arrival Time: 10:40 Had a fall or experienced change in No Accompanied By: son activities of daily living that may affect Transfer Assistance: None risk of falls: Patient Identification Verified: Yes Signs or symptoms of abuse/neglect since last visito No Secondary Verification Process Completed: Yes Hospitalized since last visit: No Patient Has Alerts: Yes Has Dressing in Place as Prescribed: Yes Patient Alerts: DMII Pain Present Now: No Electronic Signature(s) Signed: 09/15/2018 2:03:23 PM By: Harold Barban Entered By: Harold Barban on 09/15/2018 10:40:53 Hiscox, [name] ([id number]) -------------------------------------------------------------------------------- Complex / Palliative Patient Assessment Details Patient Name: [name] Date of Service: 09/15/2018 10:30 AM Medical Record Number: [id number] Patient Account Number: [id number] Date of Birth/Sex: 05/13/1925 (85 y.o. F) Treating RN: [name] Primary Care Sugar Vanzandt: Thereasa Distance Other Clinician: Referring Carletha Dawn: Thereasa Distance Treating Tetsuo Coppola/Extender: Melburn Hake, HOYT Weeks in Treatment: 15 Palliative Management Criteria Complex Wound Management Criteria Patient has remarkable or complex co-morbidities requiring medications or treatments that extend wound healing times. Examples: o Diabetes mellitus with  chronic renal failure or end stage renal disease requiring dialysis o Advanced or poorly controlled rheumatoid arthritis o Diabetes mellitus and end stage chronic obstructive pulmonary disease o Active cancer with current chemo- or radiation therapy DMII, a fib, PAD, PVD, CKD stage 3, CHF Care Approach Wound Care Plan: Complex Wound Management Electronic Signature(s) Signed: 09/15/2018 11:48:14 AM By: Worthy Keeler PA-C Signed: 09/15/2018 4:32:34 PM By: [name] Entered By: [name] on 09/15/2018 10:55:08 Scully, [name] ([id number]) -------------------------------------------------------------------------------- Lower Extremity Assessment Details Patient Name: [name] Date of Service: 09/15/2018 10:30 AM Medical Record Number: [id number] Patient Account Number: [id number] Date of Birth/Sex: 1926/04/06 (85 y.o. F) Treating RN: Harold Barban Primary Care Demonie Kassa: Thereasa Distance Other Clinician: Referring Kyon Bentler: Thereasa Distance Treating Kellen Hover/Extender: Melburn Hake, HOYT Weeks in Treatment: 15 Vascular Assessment Pulses: Dorsalis Pedis Palpable: [Right:Yes] Posterior Tibial Palpable: [Right:Yes] Electronic Signature(s) Signed: 09/15/2018 2:03:23 PM By: Harold Barban Entered By: Harold Barban on 09/15/2018 10:46:38 Scogin, [name] ([id number]) -------------------------------------------------------------------------------- Multi Wound Chart Details Patient Name: [name] Date of Service: 09/15/2018 10:30 AM Medical Record Number: [id number] Patient Account Number: [id number] Date of Birth/Sex: 1925/06/24 (85 y.o. F) Treating RN: [name] Primary Care Jalise Zawistowski: Thereasa Distance Other Clinician: Referring Zen Cedillos: Thereasa Distance Treating Andora Krull/Extender: Melburn Hake, HOYT Weeks in Treatment: 15 Vital Signs Height(in): 63 Pulse(bpm): 48 Weight(lbs): 102 Blood Pressure(mmHg): 132/55 Body Mass Index(BMI): 18 Temperature(F):  98.4 Respiratory Rate 18 (breaths/min): Photos: [N/A:N/A] Wound Location: Right Achilles N/A N/A Wounding Event: Gradually Appeared N/A N/A Primary Etiology: Arterial Insufficiency Ulcer N/A N/A Secondary Etiology: Diabetic Wound/Ulcer of the N/A N/A Lower Extremity Comorbid History: Anemia, Arrhythmia, N/A N/A Congestive Heart Failure, Hypertension, Type II Diabetes, End Stage Renal Disease, Osteoarthritis Date Acquired: 04/01/2018 N/A N/A Weeks of Treatment: 15 N/A N/A Wound Status: Open N/A N/A Pending Amputation on Yes N/A N/A Presentation: Measurements L x W x D 4x2.3x0.1 N/A N/A (cm) Area (cm) : 7.226 N/A N/A Volume (cm) : 0.723 N/A N/A % Reduction in Area: -  51.30% N/A N/A % Reduction in Volume: -51.30% N/A N/A Classification: Full Thickness With Exposed N/A N/A Support Structures Exudate Amount: Large N/A N/A Exudate Type: Serous N/A N/A Exudate Color: amber N/A N/A Wound Margin: Flat and Intact N/A N/A Granulation Amount: None Present (0%) N/A N/A Necrotic Amount: Large (67-100%) N/A N/A Sharrar, Stormi J. ([id number]) Necrotic Tissue: Eschar, Adherent Slough N/A N/A Exposed Structures: Fat Layer (Subcutaneous N/A N/A Tissue) Exposed: Yes Tendon: Yes Fascia: No Muscle: No Joint: No Bone: No Epithelialization: None N/A N/A Treatment Notes Electronic Signature(s) Signed: 09/15/2018 4:32:34 PM By: [name] Entered By: [name] on 09/15/2018 10:54:23 Goodine, [name] ([id number]) -------------------------------------------------------------------------------- Multi-Disciplinary Care Plan Details Patient Name: [name] Date of Service: 09/15/2018 10:30 AM Medical Record Number: [id number] Patient Account Number: [id number] Date of Birth/Sex: [birth year] (85 y.o. F) Treating RN: [name] Primary Care Malvern Kadlec: Thereasa Distance Other Clinician: Referring Felicite Zeimet: Thereasa Distance Treating Insiya Oshea/Extender: Melburn Hake, HOYT Weeks in Treatment:  15 Active Inactive Abuse / Safety / Falls / Self Care Management Nursing Diagnoses: Potential for falls Goals: Patient will not experience any injury related to falls Date Initiated: 06/16/2018 Target Resolution Date: 09/16/2018 Goal Status: Active Interventions: Assess fall risk on admission and as needed Notes: Necrotic Tissue Nursing Diagnoses: Knowledge deficit related to management of necrotic/devitalized tissue Goals: Patient/caregiver will verbalize understanding of reason and process for debridement of necrotic tissue Date Initiated: 06/16/2018 Target Resolution Date: 09/16/2018 Goal Status: Active Interventions: Provide education on necrotic tissue and debridement process Notes: Nutrition Nursing Diagnoses: Imbalanced nutrition Goals: Patient/caregiver agrees to and verbalizes understanding of need to use nutritional supplements and/or vitamins as prescribed Date Initiated: 06/16/2018 Target Resolution Date: 09/16/2018 Goal Status: Active Interventions: Assess patient nutrition upon admission and as needed per policy ADELIS, DOCTER ([id number]) Notes: Wound/Skin Impairment Nursing Diagnoses: Impaired tissue integrity Knowledge deficit related to ulceration/compromised skin integrity Goals: Ulcer/skin breakdown will have a volume reduction of 30% by week 4 Date Initiated: 06/02/2018 Target Resolution Date: 07/01/2018 Goal Status: Active Interventions: Assess patient/caregiver ability to obtain necessary supplies Assess patient/caregiver ability to perform ulcer/skin care regimen upon admission and as needed Assess ulceration(s) every visit Notes: Electronic Signature(s) Signed: 09/15/2018 4:32:34 PM By: [name] Entered By: [name] on 09/15/2018 10:54:16 Kasal, [name] ([id number]) -------------------------------------------------------------------------------- Pain Assessment Details Patient Name: [name] Date of Service: 09/15/2018 10:30 AM Medical  Record Number: [id number] Patient Account Number: [id number] Date of Birth/Sex: [birth year] (85 y.o. F) Treating RN: Harold Barban Primary Care Evonne Rinks: Thereasa Distance Other Clinician: Referring Xavior Niazi: Thereasa Distance Treating Sherlene Rickel/Extender: Melburn Hake, HOYT Weeks in Treatment: 15 Active Problems Location of Pain Severity and Description of Pain Patient Has Paino No Site Locations Pain Management and Medication Current Pain Management: Electronic Signature(s) Signed: 09/15/2018 2:03:23 PM By: Harold Barban Entered By: Harold Barban on 09/15/2018 10:42:45 Kretchmer, [name] ([id number]) -------------------------------------------------------------------------------- Patient/Caregiver Education Details Patient Name: [name] Date of Service: 09/15/2018 10:30 AM Medical Record Number: [id number] Patient Account Number: [id number] Date of Birth/Gender: 1925/05/30 (85 y.o. F) Treating RN: [name] Primary Care Physician: Thereasa Distance Other Clinician: Referring Physician: Thereasa Distance Treating Physician/Extender: Sharalyn Ink in Treatment: 15 Education Assessment Education Provided To: Patient and Caregiver Education Topics Provided Wound/Skin Impairment: Handouts: Other: skin sub auth Methods: Explain/Verbal Responses: State content correctly Electronic Signature(s) Signed: 09/15/2018 4:32:34 PM By: [name] Entered By: [name] on 09/15/2018 10:59:17 Goldberger, [name] ([id number]) -------------------------------------------------------------------------------- Wound Assessment Details Patient Name: [name] Date of Service: 09/15/2018 10:30  AM Medical Record Number: [id number] Patient Account Number: [id number] Date of Birth/Sex: June 11, 1925 (85 y.o. 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 Wounding Event: Gradually Appeared Etiology: Date Acquired: 04/01/2018 Wound Open Weeks Of Treatment: 15 Status: Clustered Wound: No Comorbid Anemia, Arrhythmia, Congestive Heart Failure, Pending Amputation On Presentation History: Hypertension, Type II Diabetes, End Stage Renal Disease, Osteoarthritis Photos Wound Measurements Length: (cm) 4 % Reduction Width: (cm) 2.3 % Reduction Depth: (cm) 0.1 Epitheliali Area: (cm) 7.226 Tunneling: Volume: (cm) 0.723 Underminin in Area: -51.3% in Volume: -51.3% zation: None No g: No Wound Description Full Thickness With Exposed Support Foul Odor Classification: Structures Slough/Fib Wound Margin: Flat and Intact Exudate Large Amount: Exudate Type: Serous Exudate Color: amber After Cleansing: No rino Yes Wound Bed Granulation Amount: None Present (0%) Exposed Structure Necrotic Amount: Large (67-100%) Fascia Exposed: No Necrotic Quality: Eschar, Adherent Slough Fat Layer (Subcutaneous Tissue) Exposed: Yes Tendon Exposed: Yes Muscle Exposed: No Joint Exposed: No Tortorella, Kris J. ([id number]) Bone Exposed: No Electronic Signature(s) Signed: 09/15/2018 2:03:23 PM By: Harold Barban Entered By: Harold Barban on 09/15/2018 10:47:25 Wycoff, [name] ([id number]) -------------------------------------------------------------------------------- Vitals Details Patient Name: [name] Date of Service: 09/15/2018 10:30 AM Medical Record Number: [id number] Patient Account Number: [id number] Date of Birth/Sex: September 15, 1925 (85 y.o. F) Treating RN: Harold Barban Primary Care Shenoa Hattabaugh: Thereasa Distance Other Clinician: Referring Jameila Keeny: Thereasa Distance Treating Norma Ignasiak/Extender: Melburn Hake, HOYT Weeks in Treatment: 15 Vital Signs Time Taken:  10:40 Temperature (F): 98.4 Height (in): 63 Pulse (bpm): 62 Weight (lbs): 102 Respiratory Rate (breaths/min): 18 Body Mass Index (BMI): 18.1 Blood Pressure (mmHg): 132/55 Reference Range: 80 - 120 mg / dl Electronic Signature(s) Signed: 09/15/2018 2:03:23 PM By: Harold Barban Entered By: Harold Barban on 09/15/2018 10:43:03

## 2018-09-15 NOTE — Progress Notes (Signed)
[name] ([id number]) Visit Report for 09/15/2018 Chief Complaint Document Details Patient Name: [name], [name] Date of Service: 09/15/2018 10:30 AM Medical Record Number: [id number] Patient Account Number: [id number] Date of Birth/Sex: 07-13-1925 (85 y.o. F) Treating RN: [name] Primary Care Provider: Thereasa Distance Other Clinician: Referring Provider: Thereasa Distance Treating Provider/Extender: Melburn Hake, HOYT Weeks in Treatment: 15 Information Obtained from: Patient Chief Complaint Right Lower leg Achilles ulcer Electronic Signature(s) Signed: 09/15/2018 11:48:14 AM By: Worthy Keeler PA-C Entered By: Worthy Keeler on 09/15/2018 10:35:22 [name], [name] ([id number]) -------------------------------------------------------------------------------- Debridement Details Patient Name: [name] Date of Service: 09/15/2018 10:30 AM Medical Record Number: [id number] Patient Account Number: [id number] Date of Birth/Sex: [birth year] (85 y.o. F) Treating RN: [name] Primary Care Provider: Thereasa Distance Other Clinician: Referring Provider: Thereasa Distance Treating Provider/Extender: Melburn Hake, HOYT Weeks in Treatment: 15 Debridement Performed for Wound #1 Right Achilles Assessment: Performed By: Clinician Cornell Barman, RN Debridement Type: Chemical/Enzymatic/Mechanical Agent Used: Santyl Severity of Tissue Pre Necrosis of muscle Debridement: Level of Consciousness (Pre- Awake and Alert procedure): Pre-procedure Verification/Time Yes - 10:58 Out Taken: Start Time: 10:58 Pain Control: Lidocaine 4% Topical Solution Instrument: Other : tongue blade Bleeding: None End Time: 10:59 Procedural Pain: 0 Post Procedural Pain: 0 Response to Treatment: Procedure was tolerated well Level of Consciousness Awake and Alert (Post-procedure): Post Debridement Measurements of Total Wound Length: (cm) 4 Width: (cm) 2.3 Depth: (cm) 0.1 Volume: (cm) 0.723 Character of  Wound/Ulcer Post Debridement: Requires Further Debridement Severity of Tissue Post Debridement: Necrosis of muscle Post Procedure Diagnosis Same as Pre-procedure Electronic Signature(s) Signed: 09/15/2018 11:48:14 AM By: Worthy Keeler PA-C Signed: 09/15/2018 4:32:34 PM By: [name] Entered By: [name] on 09/15/2018 10:56:36 [name], [name] ([id number]) -------------------------------------------------------------------------------- HPI Details Patient Name: [name] Date of Service: 09/15/2018 10:30 AM Medical Record Number: [id number] Patient Account Number: [id number] Date of Birth/Sex: 1925/05/24 (85 y.o. F) Treating RN: [name] Primary Care Provider: Thereasa Distance Other Clinician: Referring Provider: Thereasa Distance Treating Provider/Extender: Melburn Hake, HOYT Weeks in Treatment: 15 History of Present Illness HPI Description: 06/02/18 patient presents today for initial evaluation our clinic concerning the ulcers that she has on the right Achilles location. 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 She was subsequently supposed to have had a scheduled likely stent placement in December 2019 unfortunately she had a fall with a sub dural hematoma which subsequently had to be evacuated partially at least. For that reason she has never gotten back in with Dr. Delana Meyer to have the vascular procedure. She does not have any appointment scheduled with him at this time. The patient does have a history of chronic kidney disease stage III, congestive heart failure, trophy relation, and at this point home health who has been coming out has been using [facility]. This is been since she's been home towards the end of December as best we can tell. She does have a hemoglobin A1c of 7.0 which was obtained on 11/18/17. Currently the patient's right lower extremity that showed diminished blood flow according to her arterial  study which is dated 02/27/18. At that point her right ABI was 0.89 with a TBI of 0.36 and her left ABI was 1.13 with a TBI of 0.50. Her prior study which was in May on the 13th 2019 showed that she had a right ABI of one and a right TBI of 0.51 with a left ABI of 0.72  and a TBI of 0.50. Obviously since May till now her right leg has diminished as far as the blood flows concerned patient's son who was with her today states that Dr. 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 I do believe that for limb salvage this patient needs to have the above stated procedure with Dr. Delana Meyer as soon as possible. 06/09/18 on evaluation today patient actually appears to be doing about the same in regard to her Achilles ulcer. This actually does have tended exposed however the tenant is remaining moist which is good news that's exactly what we want to see. Fortunately there's no signs of infection she does have an appointment with Dr. Delana Meyer this coming Tuesday, June 13, 2018. 06/16/18 on evaluation today patient actually appears to be doing rather well in regard to her right lower extremity at this point. The Achilles ulcer actually showed signs of being a little bit better as far as the overall appearance of the wound was concerned today. Fortunately there did not appear to be the evidence of infection at this time which is good news. With that being said she did have her angiogram where she did actually have a significant blockage of the 90% into her lower extremity. Fortunately between the balloon angioplasty followed by stating this was able to be improved to less than 5% residual stenosis according to Dr. Nino Parsley note that I read. She still may have some limitation into her foot in particular as far as the runoff is concerned but nonetheless in general appears to be doing much better which is great news. Fortunately there is no sign of infection at this time. 06/23/18 on evaluation  today patient's wound bed actually appears to be doing about the same although the central portion of the tendon actually appears to be somewhat dry this is definitely not good news. We want to try to keep this moist while allowing the edges of the wound hopefully start to cover the tendon that is the goal. Nonetheless I think that we may need to switch things up a little bit to try to improve the moisture trapping at this point. She does have better blood flow which is great we have applied for EpiFix although unfortunately as of right now this shows to be not covered. I think that we need to try to see about getting this approved as with the tenant expose me to do everything we can to try to get this covering of the skin as soon as possible. Again I'm not even sure that she'd be a candidate for plastic surgery intervention. 06/30/18 on evaluation today patient actually appears to be doing about the same in regard to her wound on the right Achilles region. The Achilles tendon is still exposed there does not appear to be any tissue growth around the edge while the tendon itself does not appear to be quite as dry as it was previous there's definitely necrosis of the tenant beginning to be noted unfortunately. For that reason I think that we really need to make an appointment for her to see a specialist in order to discuss the options with her in regard to this one. Fortunately there does not appear to be any signs of systemic infection or even local infection for that matter. 07/07/18 on evaluation today patient appears to be doing about the same in regard to her Achilles region also. There does not appear to be any signs of active infection at this time  which is good news. With that being said her Achilles tendon is very dry and appears to be more so due to the fact that apparently the last dressing that was put on was used hydrogel with a Dry gauze of the top not what had been previously recommended.  For that reason again the tendon does appear to be quite a bit Utter, Kamyia J. ([id number]) more dry than what it was previous unfortunately. They still have not heard from orthopedics is for the referral for her Achilles region. We had made this referral last week on Tuesday the sign still hadn't heard anything and the office told him that they did not have a referral from [place] we therefore reset the referral then he still has not heard anything. 07/14/18 on evaluation today patient appears to be doing about the same in regard to her right Achilles ulcer. She has been tolerating the dressing changes without complication. With that being said nothing seems to really improve significantly in my pinion. She did see the orthopedic doctor earlier this week they recommended a referral to Dr. Vickki Muff her podiatrist for further evaluation as well as a potential referral to plastic surgery although that is not scheduled as of yet. The patient is scheduled to see her podiatrist on Monday. There's no signs of active infection at this time. 07/21/18 on evaluation today patient was actually seen on 07/17/18 by Dr. Vickki Muff who is a local podiatrist. Subsequently he did sharply debride in the office the wound to some degree to try to clear off the eschar and see how much of the tenant underlying was actually damaged. He however felt that with the patient really needed was more significant debridement of the Achilles tendon to clear this away and that a Wound VAC following. However he considers this a semi-elective surgery which subsequently the hospitals not allowing at this point. Nonetheless the patient son was present during the evaluation that she had there with the podiatrist and did we count that exactly what I was saying was exactly what he remembered as well. No fevers, chills, nausea, or vomiting noted at this time. 07/28/18 on evaluation today patient appears to be doing a little better in regard to the necrotic  tendon loosen up on the surface of her wound. She's been tolerating the dressing changes without complication. That does not appear to be any signs of active infection at this time which is good news. I'll be said that something we're trying to avoid. She does see Dr. Vickki Muff on Monday and just a couple of days. 08/04/18 on evaluation today patient appears to be doing about the same in regard to her wound on the posterior Achilles region. The tendon is still softening up but does not appear to show any signs of infection at this time. She did see her podiatrist on Monday he still feels like she needs surgery to remove the tendon in its entirety although right now again still with the hospital lockdown there's no indication that this can be done. Again this is due to the nature of surgeries that are allowed and hers apparently does not fall in the category of essential. Nonetheless I think that this is something that needs to be done sooner rather than later based on what I'm seeing. 08/11/18 on evaluation today patient appears to be doing about the same that she does have some erythema around the edge of the wound. This is coupled with the fact she's had more drainage as well as increased  pain all of which combine to make me concerned about the possibility of infection. Fortunately there's no signs of systemic infection. No fevers, chills, nausea, or vomiting noted at this time. 08/18/18 on evaluation today patient actually appears to be doing about the same though a little bit of the necrotic tendon is actually softening up with use of the Santyl. Fortunately there's no signs of active infection at this time. She's been tolerating the dressing changes without complication. 09/01/18 on evaluation today patient actually appears to be doing much better compared to what I've seen in the past out of her wound. I'm actually very pleased with the progress that she's made at this point even compared to when I  saw her last on the eighth. There is no signs of active infection at this time. No fevers, chills, nausea, or vomiting noted at this time. 09/15/18 on evaluation today patient actually appears to be doing excellent in regard to her right Achilles ulcer. She has been tolerating the dressing changes without complication. The good news is the wound is actually healing from the inside out and she has a lot of good granulation in and around the tendon. I think that she is actually at the point where we may be able to go ahead and initiate therapy with [facility] in order to get this healed more rapidly and prevent any risk of infection. The patient and her son are in agreement with this plan. Unfortunately and incidentally she knows that she's been having issues with her hearing over the past week this seems to be dramatically diminished according to her sign. Nonetheless she has not see anyone with regard to this yet I think she may need to check with [place] here knows that upstairs to see if there's anything they can do for evaluation and treatment in this regard. Electronic Signature(s) Signed: 09/15/2018 11:48:14 AM By: Worthy Keeler PA-C Entered By: Worthy Keeler on 09/15/2018 11:42:53 Varney, [name] ([id number]) -------------------------------------------------------------------------------- Physical Exam Details Patient Name: [name] Date of Service: 09/15/2018 10:30 AM Medical Record Number: [id number] Patient Account Number: [id number] Date of Birth/Sex: [birth year] (85 y.o. F) Treating RN: [name] Primary Care Provider: Thereasa Distance Other Clinician: Referring Provider: Thereasa Distance Treating Provider/Extender: Melburn Hake, HOYT Weeks in Treatment: 49 Constitutional Well-nourished and well-hydrated in no acute distress. Respiratory normal breathing without difficulty. clear to auscultation bilaterally. Cardiovascular regular rate and rhythm with normal S1,  S2. Psychiatric this patient is able to make decisions and demonstrates good insight into disease process. Alert and Oriented x 3. pleasant and cooperative. Notes Upon inspection patient's wound bed actually appears to be doing well everything is staying quite moist the tendon seems to be doing excellent and again there's a lot of good granulation starting to break through and cover over the tendon area which is also good news. Overall I feel like she has a good chance of getting this to heal and again surgery is already been postponed by Dr. Vickki Muff since the patient seems to be doing so well. Electronic Signature(s) Signed: 09/15/2018 11:48:14 AM By: Worthy Keeler PA-C Entered By: Worthy Keeler on 09/15/2018 11:44:44 [name], [name] ([id number]) -------------------------------------------------------------------------------- Physician Orders Details Patient Name: [name] Date of Service: 09/15/2018 10:30 AM Medical Record Number: [id number] Patient Account Number: [id number] Date of Birth/Sex: 15-Feb-1926 (85 y.o. F) Treating RN: [name] Primary Care Provider: Thereasa Distance Other Clinician: Referring Provider: Thereasa Distance Treating Provider/Extender: Melburn Hake, HOYT Weeks in Treatment: 15 Verbal /  Phone Orders: No Diagnosis Coding ICD-10 Coding Code Description I73.89 Other specified peripheral vascular diseases L97.818 Non-pressure chronic ulcer of other part of right lower leg with other specified severity N18.3 Chronic kidney disease, stage 3 (moderate) I48.20 Chronic atrial fibrillation, unspecified Wound Cleansing Wound #1 Right Achilles o Clean wound with Normal Saline. Primary Wound Dressing Wound #1 Right Achilles o Santyl Ointment - to wound bed - HHRN to do this as well please o Xeroform - over santyl to trap moisture on tendon Secondary Dressing Wound #1 Right Achilles o ABD and Kerlix/Conform Dressing Change Frequency Wound #1 Right  Achilles o Change dressing every day. Follow-up Appointments Wound #1 Right Achilles o Return Appointment in 1 week. Home Health Wound #1 Right Achilles o Continue Home Health Visits - [facility] Nurse may visit PRN to address patientos wound care needs. o FACE TO FACE ENCOUNTER: MEDICARE and MEDICAID PATIENTS: I certify that this patient is under my care and that I had a face-to-face encounter that meets the physician face-to-face encounter requirements with this patient on this date. The encounter with the patient was in whole or in part for the following MEDICAL CONDITION: (primary reason for [place]) MEDICAL NECESSITY: I certify, that based on my findings, NURSING services are a medically necessary home health service. HOME BOUND STATUS: I certify that my clinical findings support that this patient is homebound (i.e., Due to illness or injury, pt requires aid of supportive devices such as crutches, cane, wheelchairs, walkers, the use of special transportation or the assistance of another person to leave their place of residence. There is a normal inability to leave the home and doing so requires considerable and taxing effort. Other absences are for medical reasons / religious services and are infrequent or of short duration when for other reasons). [name], [name] ([id number]) o If current dressing causes regression in wound condition, may D/C ordered dressing product/s and apply Normal Saline Moist Dressing daily until next [place] / Other MD appointment. [place] of regression in wound condition at [phone]. o Please direct any NON-WOUND related issues/requests for orders to patient's Primary Care Physician Medications-please add to medication list. Wound #1 Right Achilles o Santyl Enzymatic Ointment Electronic Signature(s) Signed: 09/15/2018 11:48:14 AM By: Worthy Keeler PA-C Signed: 09/15/2018 4:32:34 PM By:  [name] Entered By: [name] on 09/15/2018 10:58:17 [place], [name] ([id number]) -------------------------------------------------------------------------------- Problem List Details Patient Name: [name] Date of Service: 09/15/2018 10:30 AM Medical Record Number: [id number] Patient Account Number: [id number] Date of Birth/Sex: Sep 16, 1925 (85 y.o. F) Treating RN: [name] Primary Care Provider: Thereasa Distance Other Clinician: Referring Provider: Thereasa Distance Treating Provider/Extender: Melburn Hake, HOYT Weeks in Treatment: 15 Active Problems ICD-10 Evaluated Encounter Code Description Active Date Today Diagnosis I73.89 Other specified peripheral vascular diseases 06/02/2018 No Yes L97.818 Non-pressure chronic ulcer of other part of right lower leg 06/02/2018 No Yes with other specified severity N18.3 Chronic kidney disease, stage 3 (moderate) 06/02/2018 No Yes I48.20 Chronic atrial fibrillation, unspecified 06/02/2018 No Yes Inactive Problems Resolved Problems Electronic Signature(s) Signed: 09/15/2018 11:48:14 AM By: Worthy Keeler PA-C Entered By: Worthy Keeler on 09/15/2018 10:35:11 [place], [name] ([id number]) -------------------------------------------------------------------------------- Progress Note Details Patient Name: [name] Date of Service: 09/15/2018 10:30 AM Medical Record Number: [id number] Patient Account Number: [id number] Date of Birth/Sex: 1926-01-26 (85 y.o. F) Treating RN: [name] Primary Care Provider: Thereasa Distance Other Clinician: Referring Provider: Thereasa Distance Treating Provider/Extender: Melburn Hake, HOYT  Weeks in Treatment: 15 Subjective Chief Complaint Information obtained from Patient Right Lower leg Achilles ulcer History of Present Illness (HPI) 06/02/18 patient presents today for initial evaluation our clinic concerning the ulcers that she has on the right Achilles location. 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 She was subsequently supposed to have had a scheduled likely stent placement in December 2019 unfortunately she had a fall with a sub dural hematoma which subsequently had to be evacuated partially at least. For that reason she has never gotten back in with Dr. Delana Meyer to have the vascular procedure. She does not have any appointment scheduled with him at this time. The patient does have a history of chronic kidney disease stage III, congestive heart failure, trophy relation, and at this point home health who has been coming out has been using [facility]. This is been since she's been home towards the end of December as best we can tell. She does have a hemoglobin A1c of 7.0 which was obtained on 11/18/17. Currently the patient's right lower extremity that showed diminished blood flow according to her arterial study which is dated 02/27/18. At that point her right ABI was 0.89 with a TBI of 0.36 and her left ABI was 1.13 with a TBI of 0.50. Her prior study which was in May on the 13th 2019 showed that she had a right ABI of one and a right TBI of 0.51 with a left ABI of 0.72 and a TBI of 0.50. Obviously since May till now her right leg has diminished as far as the blood flows concerned patient's son who was with her today states that Dr. 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 I do believe that for limb salvage this patient needs to have the above stated procedure with Dr. Delana Meyer as soon as possible. 06/09/18 on evaluation today patient actually appears to be doing about the same in regard to her Achilles ulcer. This actually does have tended exposed however the tenant is remaining moist which is good news that's exactly what we want to see. Fortunately there's no signs of infection she does have an appointment with Dr. Delana Meyer this coming Tuesday, June 13, 2018. 06/16/18 on evaluation today  patient actually appears to be doing rather well in regard to her right lower extremity at this point. The Achilles ulcer actually showed signs of being a little bit better as far as the overall appearance of the wound was concerned today. Fortunately there did not appear to be the evidence of infection at this time which is good news. With that being said she did have her angiogram where she did actually have a significant blockage of the 90% into her lower extremity. Fortunately between the balloon angioplasty followed by stating this was able to be improved to less than 5% residual stenosis according to Dr. Nino Parsley note that I read. She still may have some limitation into her foot in particular as far as the runoff is concerned but nonetheless in general appears to be doing much better which is great news. Fortunately there is no sign of infection at this time. 06/23/18 on evaluation today patient's wound bed actually appears to be doing about the same although the central portion of the tendon actually appears to be somewhat dry this is definitely not good news. We want to try to keep this moist while allowing the edges of the wound hopefully start to  cover the tendon that is the goal. Nonetheless I think that we may need to switch things up a little bit to try to improve the moisture trapping at this point. She does have better blood flow which is great we have applied for EpiFix although unfortunately as of right now this shows to be not covered. I think that we need to try to see about getting this approved as with the tenant expose me to do everything we can to try to get this covering of the skin as soon as possible. Again I'm not even sure that she'd be a candidate for plastic surgery intervention. 06/30/18 on evaluation today patient actually appears to be doing about the same in regard to her wound on the right Achilles region. The Achilles tendon is still exposed there does not appear to  be any tissue growth around the edge while the tendon itself does not appear to be quite as dry as it was previous there's definitely necrosis of the tenant beginning to be noted unfortunately. For that reason I think that we really need to make an appointment for her to see a specialist in order to discuss the options with her in regard to this one. Fortunately there does not appear to be any signs of systemic infection or even local [name], Kathleen J. ([id number]) infection for that matter. 07/07/18 on evaluation today patient appears to be doing about the same in regard to her Achilles region also. There does not appear to be any signs of active infection at this time which is good news. With that being said her Achilles tendon is very dry and appears to be more so due to the fact that apparently the last dressing that was put on was used hydrogel with a Dry gauze of the top not what had been previously recommended. For that reason again the tendon does appear to be quite a bit more dry than what it was previous unfortunately. They still have not heard from orthopedics is for the referral for her Achilles region. We had made this referral last week on Tuesday the sign still hadn't heard anything and the office told him that they did not have a referral from [place] we therefore reset the referral then he still has not heard anything. 07/14/18 on evaluation today patient appears to be doing about the same in regard to her right Achilles ulcer. She has been tolerating the dressing changes without complication. With that being said nothing seems to really improve significantly in my pinion. She did see the orthopedic doctor earlier this week they recommended a referral to Dr. Vickki Muff her podiatrist for further evaluation as well as a potential referral to plastic surgery although that is not scheduled as of yet. The patient is scheduled to see her podiatrist on Monday. There's no signs of active infection at this  time. 07/21/18 on evaluation today patient was actually seen on 07/17/18 by Dr. Vickki Muff who is a local podiatrist. Subsequently he did sharply debride in the office the wound to some degree to try to clear off the eschar and see how much of the tenant underlying was actually damaged. He however felt that with the patient really needed was more significant debridement of the Achilles tendon to clear this away and that a Wound VAC following. However he considers this a semi-elective surgery which subsequently the hospitals not allowing at this point. Nonetheless the patient son was present during the evaluation that she had there with the podiatrist and did  we count that exactly what I was saying was exactly what he remembered as well. No fevers, chills, nausea, or vomiting noted at this time. 07/28/18 on evaluation today patient appears to be doing a little better in regard to the necrotic tendon loosen up on the surface of her wound. She's been tolerating the dressing changes without complication. That does not appear to be any signs of active infection at this time which is good news. I'll be said that something we're trying to avoid. She does see Dr. Vickki Muff on Monday and just a couple of days. 08/04/18 on evaluation today patient appears to be doing about the same in regard to her wound on the posterior Achilles region. The tendon is still softening up but does not appear to show any signs of infection at this time. She did see her podiatrist on Monday he still feels like she needs surgery to remove the tendon in its entirety although right now again still with the hospital lockdown there's no indication that this can be done. Again this is due to the nature of surgeries that are allowed and hers apparently does not fall in the category of essential. Nonetheless I think that this is something that needs to be done sooner rather than later based on what I'm seeing. 08/11/18 on evaluation today patient  appears to be doing about the same that she does have some erythema around the edge of the wound. This is coupled with the fact she's had more drainage as well as increased pain all of which combine to make me concerned about the possibility of infection. Fortunately there's no signs of systemic infection. No fevers, chills, nausea, or vomiting noted at this time. 08/18/18 on evaluation today patient actually appears to be doing about the same though a little bit of the necrotic tendon is actually softening up with use of the Santyl. Fortunately there's no signs of active infection at this time. She's been tolerating the dressing changes without complication. 09/01/18 on evaluation today patient actually appears to be doing much better compared to what I've seen in the past out of her wound. I'm actually very pleased with the progress that she's made at this point even compared to when I saw her last on the eighth. There is no signs of active infection at this time. No fevers, chills, nausea, or vomiting noted at this time. 09/15/18 on evaluation today patient actually appears to be doing excellent in regard to her right Achilles ulcer. She has been tolerating the dressing changes without complication. The good news is the wound is actually healing from the inside out and she has a lot of good granulation in and around the tendon. I think that she is actually at the point where we may be able to go ahead and initiate therapy with Theraskin in order to get this healed more rapidly and prevent any risk of infection. The patient and her son are in agreement with this plan. Unfortunately and incidentally she knows that she's been having issues with her hearing over the past week this seems to be dramatically diminished according to her sign. Nonetheless she has not see anyone with regard to this yet I think she may need to check with [facility] here knows that upstairs to see if there's anything they can do  for evaluation and treatment in this regard. Patient History [name], [name] ([id number]) Information obtained from Patient. Family History Cancer - Siblings, Heart Disease - Mother,Siblings,Father, Hypertension - Mother,Father,Siblings, Stroke - Father,  No family history of Diabetes, Hereditary Spherocytosis, Kidney Disease, Lung Disease, Seizures, Thyroid Problems, Tuberculosis. Social History Never smoker, Marital Status - Married, Alcohol Use - Never, Drug Use - No History, Caffeine Use - Daily. Medical History Eyes Denies history of Cataracts, Glaucoma Ear/Nose/Mouth/Throat Denies history of Chronic sinus problems/congestion, Middle ear problems Hematologic/Lymphatic Patient has history of Anemia Denies history of Hemophilia, Human Immunodeficiency Virus, Lymphedema, Sickle Cell Disease Respiratory Denies history of Aspiration, Asthma, Chronic Obstructive Pulmonary Disease (COPD), Pneumothorax, Sleep Apnea, Tuberculosis Cardiovascular Patient has history of Arrhythmia - a fib, Congestive Heart Failure, Hypertension Denies history of Angina, Coronary Artery Disease, Deep Vein Thrombosis, Hypotension, Myocardial Infarction, Peripheral Arterial Disease, Peripheral Venous Disease, Phlebitis, Vasculitis Gastrointestinal Denies history of Cirrhosis , Colitis, Crohn s, Hepatitis A, Hepatitis B, Hepatitis C Endocrine Patient has history of Type II Diabetes Denies history of Type I Diabetes Genitourinary Patient has history of End Stage Renal Disease - CKD stage 3 Immunological Denies history of Lupus Erythematosus, Raynaud s, Scleroderma Integumentary (Skin) Denies history of History of Burn, History of pressure wounds Musculoskeletal Patient has history of Osteoarthritis Denies history of Gout, Rheumatoid Arthritis, Osteomyelitis Neurologic Denies history of Dementia, Neuropathy, Quadriplegia, Paraplegia, Seizure Disorder Oncologic Denies history of Received Chemotherapy,  Received Radiation Psychiatric Denies history of Anorexia/bulimia, Confinement Anxiety Medical And Surgical History Notes Cardiovascular HLD, mitral insufficiency Musculoskeletal 4 broken ribs Review of Systems (ROS) Constitutional Symptoms (General Health) Denies complaints or symptoms of Fatigue, Fever, Chills, Marked Weight Change. Respiratory Denies complaints or symptoms of Chronic or frequent coughs, Shortness of Breath. Cardiovascular Denies complaints or symptoms of Chest pain, LE edema. Psychiatric [name], [name]. ([id number]) Denies complaints or symptoms of Anxiety, Claustrophobia. Objective Constitutional Well-nourished and well-hydrated in no acute distress. Vitals Time Taken: 10:40 AM, Height: 63 in, Weight: 102 lbs, BMI: 18.1, Temperature: 98.4 F, Pulse: 62 bpm, Respiratory Rate: 18 breaths/min, Blood Pressure: 132/55 mmHg. Respiratory normal breathing without difficulty. clear to auscultation bilaterally. Cardiovascular regular rate and rhythm with normal S1, S2. Psychiatric this patient is able to make decisions and demonstrates good insight into disease process. Alert and Oriented x 3. pleasant and cooperative. General Notes: Upon inspection patient's wound bed actually appears to be doing well everything is staying quite moist the tendon seems to be doing excellent and again there's a lot of good granulation starting to break through and cover over the tendon area which is also good news. Overall I feel like she has a good chance of getting this to heal and again surgery is already been postponed by Dr. Vickki Muff since the patient seems to be doing so well. Integumentary (Hair, Skin) Wound #1 status is Open. Original cause of wound was Gradually Appeared. The wound is located on the Right Achilles. The wound measures 4cm length x 2.3cm width x 0.1cm depth; 7.226cm^2 area and 0.723cm^3 volume. There is tendon and Fat Layer (Subcutaneous Tissue) Exposed exposed.  There is no tunneling or undermining noted. There is a large amount of serous drainage noted. The wound margin is flat and intact. There is no granulation within the wound bed. There is a large (67-100%) amount of necrotic tissue within the wound bed including Eschar and Adherent Slough. Assessment Active Problems ICD-10 Other specified peripheral vascular diseases Non-pressure chronic ulcer of other part of right lower leg with other specified severity Chronic kidney disease, stage 3 (moderate) Chronic atrial fibrillation, unspecified Albin, Torah J. 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 There was a Chemical/Enzymatic/Mechanical debridement performed by Cornell Barman, RN. With the following instrument(s): tongue blade after achieving pain control using Lidocaine 4% Topical Solution. Agent used was [employer]. A time out was conducted at 10:58, prior to the start of the procedure. There was no bleeding. The procedure was tolerated well with a pain level of 0 throughout and a pain level of 0 following the procedure. Post Debridement Measurements: 4cm length x 2.3cm width x 0.1cm depth; 0.723cm^3 volume. Character of Wound/Ulcer Post Debridement requires further debridement. Severity of Tissue Post Debridement is: Necrosis of muscle. Post procedure Diagnosis Wound #1: Same as Pre-Procedure Plan Wound Cleansing: Wound #1 Right Achilles: Clean wound with Normal Saline. Primary Wound Dressing: Wound #1 Right Achilles: Santyl Ointment - to wound bed - HHRN to do this as well please Xeroform - over santyl to trap moisture on tendon Secondary Dressing: Wound #1 Right Achilles: ABD and Kerlix/Conform Dressing Change Frequency: Wound #1 Right Achilles: Change dressing every day. Follow-up Appointments: Wound #1 Right Achilles: Return  Appointment in 1 week. Home Health: Wound #1 Right Achilles: Continue Home Health Visits - [place] Nurse may visit PRN to address patient s wound care needs. FACE TO FACE ENCOUNTER: MEDICARE and MEDICAID PATIENTS: I certify that this patient is under my care and that I had a face-to-face encounter that meets the physician face-to-face encounter requirements with this patient on this date. The encounter with the patient was in whole or in part for the following MEDICAL CONDITION: (primary reason for [place]) MEDICAL NECESSITY: I certify, that based on my findings, NURSING services are a medically necessary home health service. HOME BOUND STATUS: I certify that my clinical findings support that this patient is homebound (i.e., Due to illness or injury, pt requires aid of supportive devices such as crutches, cane, wheelchairs, walkers, the use of special transportation or the assistance of another person to leave their place of residence. There is a normal inability to leave the home and doing so requires considerable and taxing effort. Other absences are for medical reasons / religious services and are infrequent or of short duration when for other reasons). If current dressing causes regression in wound condition, may D/C ordered dressing product/s and apply Normal Saline Moist Dressing daily until next [place] / Other MD appointment. [place] of regression in wound condition at [phone]. Please direct any NON-WOUND related issues/requests for orders to patient's Primary Care Physician Medications-please add to medication list.: KARTER, HAIRE ([id number]) Wound #1 Right Achilles: Santyl Enzymatic Ointment My suggestion currently is gonna be that we go ahead and see about getting things settled with regard to checking for insurance approval for the Theraskin to see if this is something we can go ahead and initiate sooner rather than later.  The patient is in agreement with that plan. Subsequently my suggestion is gonna be that if we can get approval for the Theraskin that we go ahead and initiate that there be as soon as possible and hope that we can in turn get this to heal without any need for further more significant intervention. At this time I'm gonna go ahead and recommend that we continue however with the Santyl life that seems to be helpful. Please see above for specific wound care orders. We will see patient for re-evaluation in 1 week(s) here in the clinic. If anything worsens or changes patient will contact our office for additional  recommendations. Electronic Signature(s) Signed: 09/15/2018 11:48:14 AM By: Worthy Keeler PA-C Entered By: Worthy Keeler on 09/15/2018 11:45:39 [name], [name] ([id number]) -------------------------------------------------------------------------------- ROS/PFSH Details Patient Name: [name] Date of Service: 09/15/2018 10:30 AM Medical Record Number: [id number] Patient Account Number: [id number] Date of Birth/Sex: 07-28-1925 (85 y.o. F) Treating RN: [name] Primary Care Provider: Thereasa Distance Other Clinician: Referring Provider: Thereasa Distance Treating Provider/Extender: Melburn Hake, HOYT Weeks in Treatment: 15 Information Obtained From Patient Constitutional Symptoms (General Health) Complaints and Symptoms: Negative for: Fatigue; Fever; Chills; Marked Weight Change Respiratory Complaints and Symptoms: Negative for: Chronic or frequent coughs; Shortness of Breath Medical History: Negative for: Aspiration; Asthma; Chronic Obstructive Pulmonary Disease (COPD); Pneumothorax; Sleep Apnea; Tuberculosis Cardiovascular Complaints and Symptoms: Negative for: Chest pain; LE edema Medical History: Positive for: Arrhythmia - a fib; Congestive Heart Failure; Hypertension Negative for: Angina; Coronary Artery Disease; Deep Vein Thrombosis; Hypotension; Myocardial  Infarction; Peripheral Arterial Disease; Peripheral Venous Disease; Phlebitis; Vasculitis Past Medical History Notes: HLD, mitral insufficiency Psychiatric Complaints and Symptoms: Negative for: Anxiety; Claustrophobia Medical History: Negative for: Anorexia/bulimia; Confinement Anxiety Eyes Medical History: Negative for: Cataracts; Glaucoma Ear/Nose/Mouth/Throat Medical History: Negative for: Chronic sinus problems/congestion; Middle ear problems Hematologic/Lymphatic Medical History: Positive for: Anemia [name], Kathleen J. ([id number]) Negative for: Hemophilia; Human Immunodeficiency Virus; Lymphedema; Sickle Cell Disease Gastrointestinal Medical History: Negative for: Cirrhosis ; Colitis; Crohnos; Hepatitis A; Hepatitis B; Hepatitis C Endocrine Medical History: Positive for: Type II Diabetes Negative for: Type I Diabetes Treated with: Insulin Blood sugar tested every day: Yes Tested : Genitourinary Medical History: Positive for: End Stage Renal Disease - CKD stage 3 Immunological Medical History: Negative for: Lupus Erythematosus; Raynaudos; Scleroderma Integumentary (Skin) Medical History: Negative for: History of Burn; History of pressure wounds Musculoskeletal Medical History: Positive for: Osteoarthritis Negative for: Gout; Rheumatoid Arthritis; Osteomyelitis Past Medical History Notes: 4 broken ribs Neurologic Medical History: Negative for: Dementia; Neuropathy; Quadriplegia; Paraplegia; Seizure Disorder Oncologic Medical History: Negative for: Received Chemotherapy; Received Radiation Immunizations Pneumococcal Vaccine: Received Pneumococcal Vaccination: Yes Immunization Notes: up to date Implantable Devices No devices added Family and Social History [name], [name]. ([id number]) Cancer: Yes - Siblings; Diabetes: No; Heart Disease: Yes - Mother,Siblings,Father; Hereditary Spherocytosis: No; Hypertension: Yes - Mother,Father,Siblings; Kidney Disease: No;  Lung Disease: No; Seizures: No; Stroke: Yes - Father; Thyroid Problems: No; Tuberculosis: No; Never smoker; Marital Status - Married; Alcohol Use: Never; Drug Use: No History; Caffeine Use: Daily; Financial Concerns: No; Food, Clothing or Shelter Needs: No; Support System Lacking: No; Transportation Concerns: No Physician Affirmation I have reviewed and agree with the above information. Electronic Signature(s) Signed: 09/15/2018 11:48:14 AM By: Worthy Keeler PA-C Signed: 09/15/2018 4:32:34 PM By: [name] Entered By: Worthy Keeler on 09/15/2018 11:43:28 [name], [name] ([id number]) -------------------------------------------------------------------------------- SuperBill Details Patient Name: [name] Date of Service: 09/15/2018 Medical Record Number: [id number] Patient Account Number: [id number] Date of Birth/Sex: Sep 28, 1925 (85 y.o. F) Treating RN: [name] Primary Care Provider: Thereasa Distance Other Clinician: Referring Provider: Thereasa Distance Treating Provider/Extender: Melburn Hake, HOYT Weeks in Treatment: 15 Diagnosis Coding ICD-10 Codes Code Description I73.89 Other specified peripheral vascular diseases L97.818 Non-pressure chronic ulcer of other part of right lower leg with other specified severity N18.3 Chronic kidney disease, stage 3 (moderate) I48.20 Chronic atrial fibrillation, unspecified Facility Procedures CPT4 Code: 75916384 Description: 66599 - [facility] W/O ANES NON SELECT Modifier: Quantity: 1 Physician Procedures CPT4 Code Description: 3570177 99214 - WC PHYS LEVEL 4 - EST PT ICD-10 Diagnosis Description I73.89 Other  specified peripheral vascular diseases L97.818 Non-pressure chronic ulcer of other part of right lower leg wit N18.3 Chronic kidney disease, stage 3  (moderate) I48.20 Chronic atrial fibrillation, unspecified Modifier: h other specified Quantity: 1 severity Electronic Signature(s) Signed: 09/15/2018 11:48:14 AM By: Worthy Keeler  PA-C Entered By: Worthy Keeler on 09/15/2018 11:45:53

## 2018-09-22 ENCOUNTER — Other Ambulatory Visit: Payer: Self-pay

## 2018-09-22 ENCOUNTER — Encounter: Payer: Medicare Other | Admitting: Physician Assistant

## 2018-09-22 DIAGNOSIS — E11622 Type 2 diabetes mellitus with other skin ulcer: Secondary | ICD-10-CM | POA: Diagnosis not present

## 2018-09-25 NOTE — Progress Notes (Signed)
VENNESA, BASTEDO ([id number]) Visit Report for 09/22/2018 Chief Complaint Document Details Patient Name: [name], [name] Date of Service: 09/22/2018 10:15 AM Medical Record Number: [id number] Patient Account Number: [id number] Date of Birth/Sex: 1925/05/31 (85 y.o. F) Treating RN: [name] Primary Care Provider: Thereasa Distance Other Clinician: Referring Provider: Thereasa Distance Treating Provider/Extender: Melburn Hake, Chandlor Noecker Weeks in Treatment: 16 Information Obtained from: Patient Chief Complaint Right Lower leg Achilles ulcer Electronic Signature(s) Signed: 09/25/2018 12:02:24 PM By: Worthy Keeler PA-C Entered By: Worthy Keeler on 09/22/2018 10:11:23 Axelson, [name] ([id number]) -------------------------------------------------------------------------------- HPI Details Patient Name: [name] Date of Service: 09/22/2018 10:15 AM Medical Record Number: [id number] Patient Account Number: [id number] Date of Birth/Sex: [birth year] (85 y.o. F) Treating RN: [name] Primary Care Provider: Thereasa Distance Other Clinician: Referring Provider: Thereasa Distance Treating Provider/Extender: Melburn Hake, Pieter Fooks Weeks in Treatment: 16 History of Present Illness HPI Description: 06/02/18 patient presents today for initial evaluation our clinic concerning the ulcers that she has on the right Achilles location. 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 She was subsequently supposed to have had a scheduled likely stent placement in December 2019 unfortunately she had a fall with a sub dural hematoma which subsequently had to be evacuated partially at least. For that reason she has never gotten back in with Dr. Delana Meyer to have the vascular procedure. She does not have any appointment scheduled with him at this time. The patient does have a history of chronic kidney disease stage III, congestive heart failure, trophy relation, and  at this point home health who has been coming out has been using [facility]. This is been since she's been home towards the end of December as best we can tell. She does have a hemoglobin A1c of 7.0 which was obtained on 11/18/17. Currently the patient's right lower extremity that showed diminished blood flow according to her arterial study which is dated 02/27/18. At that point her right ABI was 0.89 with a TBI of 0.36 and her left ABI was 1.13 with a TBI of 0.50. Her prior study which was in May on the 13th 2019 showed that she had a right ABI of one and a right TBI of 0.51 with a left ABI of 0.72 and a TBI of 0.50. Obviously since May till now her right leg has diminished as far as the blood flows concerned patient's son who was with her today states that Dr. 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 I do believe that for limb salvage this patient needs to have the above stated procedure with Dr. Delana Meyer as soon as possible. 06/09/18 on evaluation today patient actually appears to be doing about the same in regard to her Achilles ulcer. This actually does have tended exposed however the tenant is remaining moist which is good news that's exactly what we want to see. Fortunately there's no signs of infection she does have an appointment with Dr. Delana Meyer this coming Tuesday, June 13, 2018. 06/16/18 on evaluation today patient actually appears to be doing rather well in regard to her right lower extremity at this point. The Achilles ulcer actually showed signs of being a little bit better as far as the overall appearance of the wound was concerned today. Fortunately there did not appear to be the evidence of infection at this time which is good news. With that being said she did have her angiogram where she did actually  have a significant blockage of the 90% into her lower extremity. Fortunately between the balloon angioplasty followed by stating this was able to be improved  to less than 5% residual stenosis according to Dr. Nino Parsley note that I read. She still may have some limitation into her foot in particular as far as the runoff is concerned but nonetheless in general appears to be doing much better which is great news. Fortunately there is no sign of infection at this time. 06/23/18 on evaluation today patient's wound bed actually appears to be doing about the same although the central portion of the tendon actually appears to be somewhat dry this is definitely not good news. We want to try to keep this moist while allowing the edges of the wound hopefully start to cover the tendon that is the goal. Nonetheless I think that we may need to switch things up a little bit to try to improve the moisture trapping at this point. She does have better blood flow which is great we have applied for EpiFix although unfortunately as of right now this shows to be not covered. I think that we need to try to see about getting this approved as with the tenant expose me to do everything we can to try to get this covering of the skin as soon as possible. Again I'm not even sure that she'd be a candidate for plastic surgery intervention. 06/30/18 on evaluation today patient actually appears to be doing about the same in regard to her wound on the right Achilles region. The Achilles tendon is still exposed there does not appear to be any tissue growth around the edge while the tendon itself does not appear to be quite as dry as it was previous there's definitely necrosis of the tenant beginning to be noted unfortunately. For that reason I think that we really need to make an appointment for her to see a specialist in order to discuss the options with her in regard to this one. Fortunately there does not appear to be any signs of systemic infection or even local infection for that matter. 07/07/18 on evaluation today patient appears to be doing about the same in regard to her Achilles  region also. There does not appear to be any signs of active infection at this time which is good news. With that being said her Achilles tendon is very dry and appears to be more so due to the fact that apparently the last dressing that was put on was used hydrogel with a Dry gauze of the top not what had been previously recommended. For that reason again the tendon does appear to be quite a bit Ranganathan, Shaletha J. ([id number]) more dry than what it was previous unfortunately. They still have not heard from orthopedics is for the referral for her Achilles region. We had made this referral last week on Tuesday the sign still hadn't heard anything and the office told him that they did not have a referral from [place] we therefore reset the referral then he still has not heard anything. 07/14/18 on evaluation today patient appears to be doing about the same in regard to her right Achilles ulcer. She has been tolerating the dressing changes without complication. With that being said nothing seems to really improve significantly in my pinion. She did see the orthopedic doctor earlier this week they recommended a referral to Dr. Vickki Muff her podiatrist for further evaluation as well as a potential referral to plastic surgery although that is not  scheduled as of yet. The patient is scheduled to see her podiatrist on Monday. There's no signs of active infection at this time. 07/21/18 on evaluation today patient was actually seen on 07/17/18 by Dr. Vickki Muff who is a local podiatrist. Subsequently he did sharply debride in the office the wound to some degree to try to clear off the eschar and see how much of the tenant underlying was actually damaged. He however felt that with the patient really needed was more significant debridement of the Achilles tendon to clear this away and that a Wound VAC following. However he considers this a semi-elective surgery which subsequently the hospitals not allowing at this point. Nonetheless  the patient son was present during the evaluation that she had there with the podiatrist and did we count that exactly what I was saying was exactly what he remembered as well. No fevers, chills, nausea, or vomiting noted at this time. 07/28/18 on evaluation today patient appears to be doing a little better in regard to the necrotic tendon loosen up on the surface of her wound. She's been tolerating the dressing changes without complication. That does not appear to be any signs of active infection at this time which is good news. I'll be said that something we're trying to avoid. She does see Dr. Vickki Muff on Monday and just a couple of days. 08/04/18 on evaluation today patient appears to be doing about the same in regard to her wound on the posterior Achilles region. The tendon is still softening up but does not appear to show any signs of infection at this time. She did see her podiatrist on Monday he still feels like she needs surgery to remove the tendon in its entirety although right now again still with the hospital lockdown there's no indication that this can be done. Again this is due to the nature of surgeries that are allowed and hers apparently does not fall in the category of essential. Nonetheless I think that this is something that needs to be done sooner rather than later based on what I'm seeing. 08/11/18 on evaluation today patient appears to be doing about the same that she does have some erythema around the edge of the wound. This is coupled with the fact she's had more drainage as well as increased pain all of which combine to make me concerned about the possibility of infection. Fortunately there's no signs of systemic infection. No fevers, chills, nausea, or vomiting noted at this time. 08/18/18 on evaluation today patient actually appears to be doing about the same though a little bit of the necrotic tendon is actually softening up with use of the Santyl. Fortunately there's no signs  of active infection at this time. She's been tolerating the dressing changes without complication. 09/01/18 on evaluation today patient actually appears to be doing much better compared to what I've seen in the past out of her wound. I'm actually very pleased with the progress that she's made at this point even compared to when I saw her last on the eighth. There is no signs of active infection at this time. No fevers, chills, nausea, or vomiting noted at this time. 09/15/18 on evaluation today patient actually appears to be doing excellent in regard to her right Achilles ulcer. She has been tolerating the dressing changes without complication. The good news is the wound is actually healing from the inside out and she has a lot of good granulation in and around the tendon. I think that she is  actually at the point where we may be able to go ahead and initiate therapy with [employer] in order to get this healed more rapidly and prevent any risk of infection. The patient and her son are in agreement with this plan. Unfortunately and incidentally she knows that she's been having issues with her hearing over the past week this seems to be dramatically diminished according to her sign. Nonetheless she has not see anyone with regard to this yet I think she may need to check with [facility] here knows that upstairs to see if there's anything they can do for evaluation and treatment in this regard. 09/22/18 on evaluation today patient appears to be doing well in regard to her right Achilles ulcer. She has been tolerating the dressing changes without complication. Fortunately there's no signs of active infection at this time. Overall been very pleased with the progress and how she has done up to this point. No fevers, chills, nausea, or vomiting noted at this time. At this time were still waiting to hear back on the [employer] whether or not this will be approved. Electronic Signature(s) Signed: 09/25/2018 12:02:24  PM By: Candi Leash, Ceana JMarland Kitchen ([id number]) Entered By: Worthy Keeler on 09/22/2018 10:38:06 Cerrito, [name] ([id number]) -------------------------------------------------------------------------------- Physical Exam Details Patient Name: [name] Date of Service: 09/22/2018 10:15 AM Medical Record Number: [id number] Patient Account Number: [id number] Date of Birth/Sex: 1925/05/05 (85 y.o. F) Treating RN: [name] Primary Care Provider: Thereasa Distance Other Clinician: Referring Provider: Thereasa Distance Treating Provider/Extender: Melburn Hake, Cindy Brindisi Weeks in Treatment: 62 Constitutional Well-nourished and well-hydrated in no acute distress. Respiratory normal breathing without difficulty. clear to auscultation bilaterally. Cardiovascular regular rate and rhythm with normal S1, S2. Psychiatric this patient is able to make decisions and demonstrates good insight into disease process. Alert and Oriented x 3. pleasant and cooperative. Notes Patient's wound bed currently did have some minimal Slough noted in the surface of the wound which I was able to mechanically debride away with saline and gauze no sharp debridement was required. Subsequently post debridement everything appeared to be doing excellent there's actually a fairly good surface for which I think to go ahead and initiate treatment with collagen for the time being. Because we still want to keep this moist I would recommend putting it Xeroform gauze over top of the collagen in order to keep the moisture in. This has done very well for her up to this point. Electronic Signature(s) Signed: 09/25/2018 12:02:24 PM By: Worthy Keeler PA-C Entered By: Worthy Keeler on 09/22/2018 10:38:45 Gohlke, [name] ([id number]) -------------------------------------------------------------------------------- Physician Orders Details Patient Name: [name] Date of Service: 09/22/2018 10:15 AM Medical Record Number:  [id number] Patient Account Number: [id number] Date of Birth/Sex: [birth year] (85 y.o. F) Treating RN: [name] Primary Care Provider: Thereasa Distance Other Clinician: Referring Provider: Thereasa Distance Treating Provider/Extender: Melburn Hake, Florrie Ramires Weeks in Treatment: 61 Verbal / Phone Orders: No Diagnosis Coding ICD-10 Coding Code Description I73.89 Other specified peripheral vascular diseases L97.818 Non-pressure chronic ulcer of other part of right lower leg with other specified severity N18.3 Chronic kidney disease, stage 3 (moderate) I48.20 Chronic atrial fibrillation, unspecified Wound Cleansing Wound #1 Right Achilles o Clean wound with Normal Saline. Primary Wound Dressing Wound #1 Right Achilles o Silver Collagen - to wound bed o Xeroform - over silver collagen to trap moisture on tendon Secondary Dressing Wound #1 Right Achilles o ABD and Kerlix/Conform Dressing Change Frequency Wound #1 Right Achilles o  Change Dressing Monday, Wednesday, Friday Follow-up Appointments Wound #1 Right Achilles o Return Appointment in 1 week. Home Health Wound #1 Right Achilles o Continue Home Health Visits - [place] to visit patient for wound care on Mondays and Wednesdays o Home Health Nurse may visit PRN to address patientos wound care needs. o FACE TO FACE ENCOUNTER: MEDICARE and MEDICAID PATIENTS: I certify that this patient is under my care and that I had a face-to-face encounter that meets the physician face-to-face encounter requirements with this patient on this date. The encounter with the patient was in whole or in part for the following MEDICAL CONDITION: (primary reason for [place]) MEDICAL NECESSITY: I certify, that based on my findings, NURSING services are a medically necessary home health service. HOME BOUND STATUS: I certify that my clinical findings support that this patient is homebound (i.e., Due to illness or injury, pt requires  aid of supportive devices such as crutches, cane, wheelchairs, walkers, the use of special transportation or the assistance of another person to leave their place of residence. There is a normal inability to leave the home and doing so requires considerable and taxing effort. Other absences are for medical reasons / religious services and are infrequent or of short duration when for other reasons). HANEEN, BERNALES ([id number]) o If current dressing causes regression in wound condition, may D/C ordered dressing product/s and apply Normal Saline Moist Dressing daily until next [place] / Other MD appointment. [place] of regression in wound condition at [phone]. o Please direct any NON-WOUND related issues/requests for orders to patient's Primary Care Physician Electronic Signature(s) Signed: 09/22/2018 5:04:52 PM By: [name] Signed: 09/25/2018 12:02:24 PM By: Worthy Keeler PA-C Entered By: [name] on 09/22/2018 10:34:11 Kroenke, [name] ([id number]) -------------------------------------------------------------------------------- Problem List Details Patient Name: [name] Date of Service: 09/22/2018 10:15 AM Medical Record Number: [id number] Patient Account Number: [id number] Date of Birth/Sex: 10/05/1925 (85 y.o. F) Treating RN: [name] Primary Care Provider: Thereasa Distance Other Clinician: Referring Provider: Thereasa Distance Treating Provider/Extender: Melburn Hake, Pricilla Moehle Weeks in Treatment: 16 Active Problems ICD-10 Evaluated Encounter Code Description Active Date Today Diagnosis I73.89 Other specified peripheral vascular diseases 06/02/2018 No Yes L97.818 Non-pressure chronic ulcer of other part of right lower leg 06/02/2018 No Yes with other specified severity N18.3 Chronic kidney disease, stage 3 (moderate) 06/02/2018 No Yes I48.20 Chronic atrial fibrillation, unspecified 06/02/2018 No Yes Inactive Problems Resolved  Problems Electronic Signature(s) Signed: 09/25/2018 12:02:24 PM By: Worthy Keeler PA-C Entered By: Worthy Keeler on 09/22/2018 10:11:17 Ritthaler, [name] ([id number]) -------------------------------------------------------------------------------- Progress Note Details Patient Name: [name] Date of Service: 09/22/2018 10:15 AM Medical Record Number: [id number] Patient Account Number: [id number] Date of Birth/Sex: 24-Feb-1926 (85 y.o. F) Treating RN: [name] Primary Care Provider: Thereasa Distance Other Clinician: Referring Provider: Thereasa Distance Treating Provider/Extender: Melburn Hake, Quintarius Ferns Weeks in Treatment: 16 Subjective Chief Complaint Information obtained from Patient Right Lower leg Achilles ulcer History of Present Illness (HPI) 06/02/18 patient presents today for initial evaluation our clinic concerning the ulcers that she has on the right Achilles location. 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 She was subsequently supposed to have had a scheduled likely stent placement in December 2019 unfortunately she had a fall with a sub dural hematoma which subsequently had to be evacuated partially at least. For that reason she has never gotten back in with Dr. Delana Meyer  to have the vascular procedure. She does not have any appointment scheduled with him at this time. The patient does have a history of chronic kidney disease stage III, congestive heart failure, trophy relation, and at this point home health who has been coming out has been using [facility]. This is been since she's been home towards the end of December as best we can tell. She does have a hemoglobin A1c of 7.0 which was obtained on 11/18/17. Currently the patient's right lower extremity that showed diminished blood flow according to her arterial study which is dated 02/27/18. At that point her right ABI was 0.89 with a TBI of 0.36 and her left ABI  was 1.13 with a TBI of 0.50. Her prior study which was in May on the 13th 2019 showed that she had a right ABI of one and a right TBI of 0.51 with a left ABI of 0.72 and a TBI of 0.50. Obviously since May till now her right leg has diminished as far as the blood flows concerned patient's son who was with her today states that Dr. 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 I do believe that for limb salvage this patient needs to have the above stated procedure with Dr. Delana Meyer as soon as possible. 06/09/18 on evaluation today patient actually appears to be doing about the same in regard to her Achilles ulcer. This actually does have tended exposed however the tenant is remaining moist which is good news that's exactly what we want to see. Fortunately there's no signs of infection she does have an appointment with Dr. Delana Meyer this coming Tuesday, June 13, 2018. 06/16/18 on evaluation today patient actually appears to be doing rather well in regard to her right lower extremity at this point. The Achilles ulcer actually showed signs of being a little bit better as far as the overall appearance of the wound was concerned today. Fortunately there did not appear to be the evidence of infection at this time which is good news. With that being said she did have her angiogram where she did actually have a significant blockage of the 90% into her lower extremity. Fortunately between the balloon angioplasty followed by stating this was able to be improved to less than 5% residual stenosis according to Dr. Nino Parsley note that I read. She still may have some limitation into her foot in particular as far as the runoff is concerned but nonetheless in general appears to be doing much better which is great news. Fortunately there is no sign of infection at this time. 06/23/18 on evaluation today patient's wound bed actually appears to be doing about the same although the central portion  of the tendon actually appears to be somewhat dry this is definitely not good news. We want to try to keep this moist while allowing the edges of the wound hopefully start to cover the tendon that is the goal. Nonetheless I think that we may need to switch things up a little bit to try to improve the moisture trapping at this point. She does have better blood flow which is great we have applied for EpiFix although unfortunately as of right now this shows to be not covered. I think that we need to try to see about getting this approved as with the tenant expose me to do everything we can to try to get this covering of the skin as soon as possible. Again I'm not even sure that she'd be a candidate  for plastic surgery intervention. 06/30/18 on evaluation today patient actually appears to be doing about the same in regard to her wound on the right Achilles region. The Achilles tendon is still exposed there does not appear to be any tissue growth around the edge while the tendon itself does not appear to be quite as dry as it was previous there's definitely necrosis of the tenant beginning to be noted unfortunately. For that reason I think that we really need to make an appointment for her to see a specialist in order to discuss the options with her in regard to this one. Fortunately there does not appear to be any signs of systemic infection or even local Klem, Janissa J. ([id number]) infection for that matter. 07/07/18 on evaluation today patient appears to be doing about the same in regard to her Achilles region also. There does not appear to be any signs of active infection at this time which is good news. With that being said her Achilles tendon is very dry and appears to be more so due to the fact that apparently the last dressing that was put on was used hydrogel with a Dry gauze of the top not what had been previously recommended. For that reason again the tendon does appear to be quite a bit more  dry than what it was previous unfortunately. They still have not heard from orthopedics is for the referral for her Achilles region. We had made this referral last week on Tuesday the sign still hadn't heard anything and the office told him that they did not have a referral from [place] we therefore reset the referral then he still has not heard anything. 07/14/18 on evaluation today patient appears to be doing about the same in regard to her right Achilles ulcer. She has been tolerating the dressing changes without complication. With that being said nothing seems to really improve significantly in my pinion. She did see the orthopedic doctor earlier this week they recommended a referral to Dr. Vickki Muff her podiatrist for further evaluation as well as a potential referral to plastic surgery although that is not scheduled as of yet. The patient is scheduled to see her podiatrist on Monday. There's no signs of active infection at this time. 07/21/18 on evaluation today patient was actually seen on 07/17/18 by Dr. Vickki Muff who is a local podiatrist. Subsequently he did sharply debride in the office the wound to some degree to try to clear off the eschar and see how much of the tenant underlying was actually damaged. He however felt that with the patient really needed was more significant debridement of the Achilles tendon to clear this away and that a Wound VAC following. However he considers this a semi-elective surgery which subsequently the hospitals not allowing at this point. Nonetheless the patient son was present during the evaluation that she had there with the podiatrist and did we count that exactly what I was saying was exactly what he remembered as well. No fevers, chills, nausea, or vomiting noted at this time. 07/28/18 on evaluation today patient appears to be doing a little better in regard to the necrotic tendon loosen up on the surface of her wound. She's been tolerating the dressing changes without  complication. That does not appear to be any signs of active infection at this time which is good news. I'll be said that something we're trying to avoid. She does see Dr. Vickki Muff on Monday and just a couple of days. 08/04/18 on evaluation today patient  appears to be doing about the same in regard to her wound on the posterior Achilles region. The tendon is still softening up but does not appear to show any signs of infection at this time. She did see her podiatrist on Monday he still feels like she needs surgery to remove the tendon in its entirety although right now again still with the hospital lockdown there's no indication that this can be done. Again this is due to the nature of surgeries that are allowed and hers apparently does not fall in the category of essential. Nonetheless I think that this is something that needs to be done sooner rather than later based on what I'm seeing. 08/11/18 on evaluation today patient appears to be doing about the same that she does have some erythema around the edge of the wound. This is coupled with the fact she's had more drainage as well as increased pain all of which combine to make me concerned about the possibility of infection. Fortunately there's no signs of systemic infection. No fevers, chills, nausea, or vomiting noted at this time. 08/18/18 on evaluation today patient actually appears to be doing about the same though a little bit of the necrotic tendon is actually softening up with use of the Santyl. Fortunately there's no signs of active infection at this time. She's been tolerating the dressing changes without complication. 09/01/18 on evaluation today patient actually appears to be doing much better compared to what I've seen in the past out of her wound. I'm actually very pleased with the progress that she's made at this point even compared to when I saw her last on the eighth. There is no signs of active infection at this time. No fevers, chills,  nausea, or vomiting noted at this time. 09/15/18 on evaluation today patient actually appears to be doing excellent in regard to her right Achilles ulcer. She has been tolerating the dressing changes without complication. The good news is the wound is actually healing from the inside out and she has a lot of good granulation in and around the tendon. I think that she is actually at the point where we may be able to go ahead and initiate therapy with Theraskin in order to get this healed more rapidly and prevent any risk of infection. The patient and her son are in agreement with this plan. Unfortunately and incidentally she knows that she's been having issues with her hearing over the past week this seems to be dramatically diminished according to her sign. Nonetheless she has not see anyone with regard to this yet I think she may need to check with [place] here knows that upstairs to see if there's anything they can do for evaluation and treatment in this regard. 09/22/18 on evaluation today patient appears to be doing well in regard to her right Achilles ulcer. She has been tolerating the dressing changes without complication. Fortunately there's no signs of active infection at this time. Overall been very pleased Cade, Jenavi J. ([id number]) with the progress and how she has done up to this point. No fevers, chills, nausea, or vomiting noted at this time. At this time were still waiting to hear back on the Therask[employer] whether or not this will be approved. Patient History Information obtained from Patient. Family History Cancer - Siblings, Heart Disease - Mother,Siblings,Father, Hypertension - Mother,Father,Siblings, Stroke - Father, No family history of Diabetes, Hereditary Spherocytosis, Kidney Disease, Lung Disease, Seizures, Thyroid Problems, Tuberculosis. Social History Never smoker, Marital Status - Married, Alcohol  Use - Never, Drug Use - No History, Caffeine Use - Daily. Medical  History Eyes Denies history of Cataracts, Glaucoma Ear/Nose/Mouth/Throat Denies history of Chronic sinus problems/congestion, Middle ear problems Hematologic/Lymphatic Patient has history of Anemia Denies history of Hemophilia, Human Immunodeficiency Virus, Lymphedema, Sickle Cell Disease Respiratory Denies history of Aspiration, Asthma, Chronic Obstructive Pulmonary Disease (COPD), Pneumothorax, Sleep Apnea, Tuberculosis Cardiovascular Patient has history of Arrhythmia - a fib, Congestive Heart Failure, Hypertension Denies history of Angina, Coronary Artery Disease, Deep Vein Thrombosis, Hypotension, Myocardial Infarction, Peripheral Arterial Disease, Peripheral Venous Disease, Phlebitis, Vasculitis Gastrointestinal Denies history of Cirrhosis , Colitis, Crohn s, Hepatitis A, Hepatitis B, Hepatitis C Endocrine Patient has history of Type II Diabetes Denies history of Type I Diabetes Genitourinary Patient has history of End Stage Renal Disease - CKD stage 3 Immunological Denies history of Lupus Erythematosus, Raynaud s, Scleroderma Integumentary (Skin) Denies history of History of Burn, History of pressure wounds Musculoskeletal Patient has history of Osteoarthritis Denies history of Gout, Rheumatoid Arthritis, Osteomyelitis Neurologic Denies history of Dementia, Neuropathy, Quadriplegia, Paraplegia, Seizure Disorder Oncologic Denies history of Received Chemotherapy, Received Radiation Psychiatric Denies history of Anorexia/bulimia, Confinement Anxiety Medical And Surgical History Notes Cardiovascular HLD, mitral insufficiency Musculoskeletal 4 broken ribs Review of Systems (ROS) Constitutional Symptoms (General Health) Denies complaints or symptoms of Fatigue, Fever, Chills, Marked Weight Change. LIANI, CARIS. ([id number]) Respiratory Denies complaints or symptoms of Chronic or frequent coughs, Shortness of Breath. Cardiovascular Denies complaints or symptoms of Chest  pain, LE edema. Psychiatric Denies complaints or symptoms of Anxiety, Claustrophobia. Objective Constitutional Well-nourished and well-hydrated in no acute distress. Vitals Time Taken: 10:15 AM, Height: 63 in, Weight: 102 lbs, BMI: 18.1, Temperature: 97.7 F, Pulse: 65 bpm, Respiratory Rate: 16 breaths/min, Blood Pressure: 166/66 mmHg. Respiratory normal breathing without difficulty. clear to auscultation bilaterally. Cardiovascular regular rate and rhythm with normal S1, S2. Psychiatric this patient is able to make decisions and demonstrates good insight into disease process. Alert and Oriented x 3. pleasant and cooperative. General Notes: Patient's wound bed currently did have some minimal Slough noted in the surface of the wound which I was able to mechanically debride away with saline and gauze no sharp debridement was required. Subsequently post debridement everything appeared to be doing excellent there's actually a fairly good surface for which I think to go ahead and initiate treatment with collagen for the time being. Because we still want to keep this moist I would recommend putting it Xeroform gauze over top of the collagen in order to keep the moisture in. This has done very well for her up to this point. Integumentary (Hair, Skin) Wound #1 status is Open. Original cause of wound was Gradually Appeared. The wound is located on the Right Achilles. The wound measures 4cm length x 2.3cm width x 0.1cm depth; 7.226cm^2 area and 0.723cm^3 volume. There is tendon and Fat Layer (Subcutaneous Tissue) Exposed exposed. There is no tunneling or undermining noted. There is a large amount of serous drainage noted. The wound margin is flat and intact. There is no granulation within the wound bed. There is a large (67-100%) amount of necrotic tissue within the wound bed including Eschar and Adherent Slough. Assessment Active Problems ICD-10 Other specified peripheral vascular  diseases Non-pressure chronic ulcer of other part of right lower leg with other specified severity Gut, Alaynah J. ([id number]) Chronic kidney disease, stage 3 (moderate) Chronic atrial fibrillation, unspecified Plan Wound Cleansing: Wound #1 Right Achilles: Clean wound with Normal Saline. Primary Wound Dressing: Wound #1  Right Achilles: Silver Collagen - to wound bed Xeroform - over silver collagen to trap moisture on tendon Secondary Dressing: Wound #1 Right Achilles: ABD and Kerlix/Conform Dressing Change Frequency: Wound #1 Right Achilles: Change Dressing Monday, Wednesday, Friday Follow-up Appointments: Wound #1 Right Achilles: Return Appointment in 1 week. Home Health: Wound #1 Right Achilles: Continue Home Health Visits - [facility] to visit patient for wound care on Mondays and Wednesdays Home Health Nurse may visit PRN to address patient s wound care needs. FACE TO FACE ENCOUNTER: MEDICARE and MEDICAID PATIENTS: I certify that this patient is under my care and that I had a face-to-face encounter that meets the physician face-to-face encounter requirements with this patient on this date. The encounter with the patient was in whole or in part for the following MEDICAL CONDITION: (primary reason for [place]) MEDICAL NECESSITY: I certify, that based on my findings, NURSING services are a medically necessary home health service. HOME BOUND STATUS: I certify that my clinical findings support that this patient is homebound (i.e., Due to illness or injury, pt requires aid of supportive devices such as crutches, cane, wheelchairs, walkers, the use of special transportation or the assistance of another person to leave their place of residence. There is a normal inability to leave the home and doing so requires considerable and taxing effort. Other absences are for medical reasons / religious services and are infrequent or of short duration when for other reasons). If  current dressing causes regression in wound condition, may D/C ordered dressing product/s and apply Normal Saline Moist Dressing daily until next [place] / Other MD appointment. [place] of regression in wound condition at [phone]. Please direct any NON-WOUND related issues/requests for orders to patient's Primary Care Physician My suggestion currently is gonna be that we continue with the above wound care measures for the next week and the patient is in agreement that plan. We will subsequently see were things stand at follow-up. If anything changes worsens meantime shall contact the office you let me know or have her son to do so. Otherwise we will see were things stand at follow-up. Please see above for specific wound care orders. We will see patient for re-evaluation in 1 week(s) here in the clinic. If anything worsens or changes patient will contact our office for additional recommendations. Electronic Signature(s) Signed: 09/25/2018 12:02:24 PM By: Candi Leash, Alecea JMarland Kitchen ([id number]) Entered By: Worthy Keeler on 09/22/2018 10:39:10 Kimmel, [name] ([id number]) -------------------------------------------------------------------------------- ROS/PFSH Details Patient Name: [name] Date of Service: 09/22/2018 10:15 AM Medical Record Number: [id number] Patient Account Number: [id number] Date of Birth/Sex: Aug 29, 1925 (85 y.o. F) Treating RN: [name] Primary Care Provider: Thereasa Distance Other Clinician: Referring Provider: Thereasa Distance Treating Provider/Extender: Melburn Hake, Kaina Orengo Weeks in Treatment: 16 Information Obtained From Patient Constitutional Symptoms (General Health) Complaints and Symptoms: Negative for: Fatigue; Fever; Chills; Marked Weight Change Respiratory Complaints and Symptoms: Negative for: Chronic or frequent coughs; Shortness of Breath Medical History: Negative for: Aspiration; Asthma; Chronic  Obstructive Pulmonary Disease (COPD); Pneumothorax; Sleep Apnea; Tuberculosis Cardiovascular Complaints and Symptoms: Negative for: Chest pain; LE edema Medical History: Positive for: Arrhythmia - a fib; Congestive Heart Failure; Hypertension Negative for: Angina; Coronary Artery Disease; Deep Vein Thrombosis; Hypotension; Myocardial Infarction; Peripheral Arterial Disease; Peripheral Venous Disease; Phlebitis; Vasculitis Past Medical History Notes: HLD, mitral insufficiency Psychiatric Complaints and Symptoms: Negative for: Anxiety; Claustrophobia Medical History: Negative for: Anorexia/bulimia; Confinement Anxiety Eyes Medical History: Negative for:  Cataracts; Glaucoma Ear/Nose/Mouth/Throat Medical History: Negative for: Chronic sinus problems/congestion; Middle ear problems Hematologic/Lymphatic Medical History: Positive for: Anemia Yordy, Shanekia J. ([id number]) Negative for: Hemophilia; Human Immunodeficiency Virus; Lymphedema; Sickle Cell Disease Gastrointestinal Medical History: Negative for: Cirrhosis ; Colitis; Crohnos; Hepatitis A; Hepatitis B; Hepatitis C Endocrine Medical History: Positive for: Type II Diabetes Negative for: Type I Diabetes Treated with: Insulin Blood sugar tested every day: Yes Tested : Genitourinary Medical History: Positive for: End Stage Renal Disease - CKD stage 3 Immunological Medical History: Negative for: Lupus Erythematosus; Raynaudos; Scleroderma Integumentary (Skin) Medical History: Negative for: History of Burn; History of pressure wounds Musculoskeletal Medical History: Positive for: Osteoarthritis Negative for: Gout; Rheumatoid Arthritis; Osteomyelitis Past Medical History Notes: 4 broken ribs Neurologic Medical History: Negative for: Dementia; Neuropathy; Quadriplegia; Paraplegia; Seizure Disorder Oncologic Medical History: Negative for: Received Chemotherapy; Received Radiation Immunizations Pneumococcal  Vaccine: Received Pneumococcal Vaccination: Yes Immunization Notes: up to date Implantable Devices No devices added Family and Social History Siordia, SUZANE VANDERWEIDE. ([id number]) Cancer: Yes - Siblings; Diabetes: No; Heart Disease: Yes - Mother,Siblings,Father; Hereditary Spherocytosis: No; Hypertension: Yes - Mother,Father,Siblings; Kidney Disease: No; Lung Disease: No; Seizures: No; Stroke: Yes - Father; Thyroid Problems: No; Tuberculosis: No; Never smoker; Marital Status - Married; Alcohol Use: Never; Drug Use: No History; Caffeine Use: Daily; Financial Concerns: No; Food, Clothing or Shelter Needs: No; Support System Lacking: No; Transportation Concerns: No Physician Affirmation I have reviewed and agree with the above information. Electronic Signature(s) Signed: 09/22/2018 5:04:52 PM By: [name] Signed: 09/25/2018 12:02:24 PM By: Worthy Keeler PA-C Entered By: Worthy Keeler on 09/22/2018 10:38:26 Kretsch, [name] ([id number]) -------------------------------------------------------------------------------- SuperBill Details Patient Name: [name] Date of Service: 09/22/2018 Medical Record Number: [id number] Patient Account Number: [id number] Date of Birth/Sex: 10/28/1925 (85 y.o. F) Treating RN: [name] Primary Care Provider: Thereasa Distance Other Clinician: Referring Provider: Thereasa Distance Treating Provider/Extender: Melburn Hake, Laddie Naeem Weeks in Treatment: 16 Diagnosis Coding ICD-10 Codes Code Description I73.89 Other specified peripheral vascular diseases L97.818 Non-pressure chronic ulcer of other part of right lower leg with other specified severity N18.3 Chronic kidney disease, stage 3 (moderate) I48.20 Chronic atrial fibrillation, unspecified Facility Procedures CPT4 Code: [id number] Description: 99213 - WOUND CARE VISIT-LEV 3 EST PT Modifier: Quantity: 1 Physician Procedures CPT4 Code Description: 0998338 25053 - WC PHYS LEVEL 4 - EST PT ICD-10 Diagnosis  Description I73.89 Other specified peripheral vascular diseases L97.818 Non-pressure chronic ulcer of other part of right lower leg wit N18.3 Chronic kidney disease, stage 3  (moderate) I48.20 Chronic atrial fibrillation, unspecified Modifier: h other specified Quantity: 1 severity Electronic Signature(s) Signed: 09/25/2018 12:02:24 PM By: Worthy Keeler PA-C Entered By: Worthy Keeler on 09/22/2018 10:39:22

## 2018-09-25 NOTE — Progress Notes (Signed)
Kathleen, Carlson ([id number]) Visit Report for 09/22/2018 Arrival Information Details Patient Name: [name], [name] Date of Service: 09/22/2018 10:15 AM Medical Record Number: [id number] Patient Account Number: [id number] Date of Birth/Sex: 01-12-1926 (85 y.o. F) Treating RN: Harold Barban Primary Care Mahdiya Mossberg: Thereasa Distance Other Clinician: Referring Rogena Deupree: Thereasa Distance Treating Fintan Grater/Extender: Melburn Hake, HOYT Weeks in Treatment: 16 Visit Information History Since Last Visit Added or deleted any medications: No Patient Arrived: Wheel Chair Any new allergies or adverse reactions: No Arrival Time: 10:13 Had a fall or experienced change in No Accompanied By: son-John activities of daily living that may affect Transfer Assistance: None risk of falls: Patient Identification Verified: Yes Signs or symptoms of abuse/neglect since last visito No Secondary Verification Process Completed: Yes Hospitalized since last visit: No Patient Has Alerts: Yes Has Dressing in Place as Prescribed: Yes Patient Alerts: DMII Pain Present Now: No Electronic Signature(s) Signed: 09/22/2018 4:12:52 PM By: Harold Barban Entered By: Harold Barban on 09/22/2018 10:14:12 Hornbaker, [name] ([id number]) -------------------------------------------------------------------------------- Clinic Level of Care Assessment Details Patient Name: [name] Date of Service: 09/22/2018 10:15 AM Medical Record Number: [id number] Patient Account Number: [id number] Date of Birth/Sex: 1926/01/26 (85 y.o. F) Treating RN: [name] Primary Care Olevia Westervelt: Thereasa Distance Other Clinician: Referring Kaian Fahs: Thereasa Distance Treating Jamyah Folk/Extender: Melburn Hake, HOYT Weeks in Treatment: 16 Clinic Level of Care Assessment Items TOOL 4 Quantity Score []  - Use when only an EandM is performed on FOLLOW-UP visit 0 ASSESSMENTS - Nursing Assessment / Reassessment X - Reassessment of Co-morbidities (includes  updates in patient status) 1 10 X- 1 5 Reassessment of Adherence to Treatment Plan ASSESSMENTS - Wound and Skin Assessment / Reassessment X - Simple Wound Assessment / Reassessment - one wound 1 5 []  - 0 Complex Wound Assessment / Reassessment - multiple wounds []  - 0 Dermatologic / Skin Assessment (not related to wound area) ASSESSMENTS - Focused Assessment []  - Circumferential Edema Measurements - multi extremities 0 []  - 0 Nutritional Assessment / Counseling / Intervention X- 1 5 Lower Extremity Assessment (monofilament, tuning fork, pulses) []  - 0 Peripheral Arterial Disease Assessment (using hand held doppler) ASSESSMENTS - Ostomy and/or Continence Assessment and Care []  - Incontinence Assessment and Management 0 []  - 0 Ostomy Care Assessment and Management (repouching, etc.) PROCESS - Coordination of Care X - Simple Patient / Family Education for ongoing care 1 15 []  - 0 Complex (extensive) Patient / Family Education for ongoing care X- 1 10 Staff obtains Programmer, systems, Records, Test Results / Process Orders []  - 0 Staff telephones HHA, Nursing Homes / Clarify orders / etc []  - 0 Routine Transfer to another Facility (non-emergent condition) []  - 0 Routine Hospital Admission (non-emergent condition) []  - 0 New Admissions / Biomedical engineer / Ordering NPWT, Apligraf, etc. []  - 0 Emergency Hospital Admission (emergent condition) X- 1 10 Simple Discharge Coordination Leasure, Zykerria J. ([id number]) []  - 0 Complex (extensive) Discharge Coordination PROCESS - Special Needs []  - Pediatric / Minor Patient Management 0 []  - 0 Isolation Patient Management []  - 0 Hearing / Language / Visual special needs []  - 0 Assessment of Community assistance (transportation, D/C planning, etc.) []  - 0 Additional assistance / Altered mentation []  - 0 Support Surface(s) Assessment (bed, cushion, seat, etc.) INTERVENTIONS - Wound Cleansing / Measurement X - Simple Wound Cleansing -  one wound 1 5 []  - 0 Complex Wound Cleansing - multiple wounds X- 1 5 Wound Imaging (photographs - any number of wounds) []  - 0 Wound Tracing (  instead of photographs) X- 1 5 Simple Wound Measurement - one wound []  - 0 Complex Wound Measurement - multiple wounds INTERVENTIONS - Wound Dressings X - Small Wound Dressing one or multiple wounds 1 10 []  - 0 Medium Wound Dressing one or multiple wounds []  - 0 Large Wound Dressing one or multiple wounds []  - 0 Application of Medications - topical []  - 0 Application of Medications - injection INTERVENTIONS - Miscellaneous []  - External ear exam 0 []  - 0 Specimen Collection (cultures, biopsies, blood, body fluids, etc.) []  - 0 Specimen(s) / Culture(s) sent or taken to Lab for analysis []  - 0 Patient Transfer (multiple staff / Civil Service fast streamer / Similar devices) []  - 0 Simple Staple / Suture removal (25 or less) []  - 0 Complex Staple / Suture removal (26 or more) []  - 0 Hypo / Hyperglycemic Management (close monitor of Blood Glucose) []  - 0 Ankle / Brachial Index (ABI) - do not check if billed separately X- 1 5 Vital Signs Sliger, Tana J. ([id number]) Has the patient been seen at the hospital within the last three years: Yes Total Score: 90 Level Of Care: New/Established - Level 3 Electronic Signature(s) Signed: 09/22/2018 5:04:52 PM By: [name] Entered By: [name] on 09/22/2018 10:34:38 Gu[place]a, [name] -------------------------------------------------------------------------------- Encounter Discharge Information Details Patient Name: [name] Date of Service: 09/22/2018 10:15 AM Medical Record Number: [id number] Patient Account Number: [id number] Date of Birth/Sex: Feb 14, 1926 (85 y.o. F) Treating RN: [name] Primary Care Damir Leung: Thereasa Distance Other Clinician: Referring Sumayah Bearse: Thereasa Distance Treating Katira Dumais/Extender: Melburn Hake, HOYT Weeks in Treatment: 16 Encounter Discharge  Information Items Discharge Condition: Stable Ambulatory Status: Wheelchair Discharge Destination: Home Transportation: Private Auto Accompanied By: son Schedule Follow-up Appointment: Yes Clinical Summary of Care: Electronic Signature(s) Signed: 09/22/2018 5:04:52 PM By: [name] Entered By: [name] on 09/22/2018 10:35:33 Blann, [name] ([id number]) -------------------------------------------------------------------------------- Lower Extremity Assessment Details Patient Name: [name] Date of Service: 09/22/2018 10:15 AM Medical Record Number: [id number] Patient Account Number: [id number] Date of Birth/Sex: December 12, 1925 (85 y.o. F) Treating RN: Harold Barban Primary Care Brande Uncapher: Thereasa Distance Other Clinician: Referring Donie Moulton: Thereasa Distance Treating Jaison Petraglia/Extender: Melburn Hake, HOYT Weeks in Treatment: 16 Vascular Assessment Pulses: Dorsalis Pedis Palpable: [Right:Yes] Posterior Tibial Palpable: [Right:Yes] Electronic Signature(s) Signed: 09/22/2018 4:12:52 PM By: Harold Barban Entered By: Harold Barban on 09/22/2018 10:21:26 Neece, [name] ([id number]) -------------------------------------------------------------------------------- Multi Wound Chart Details Patient Name: [name] Date of Service: 09/22/2018 10:15 AM Medical Record Number: [id number] Patient Account Number: [id number] Date of Birth/Sex: [birth year] (85 y.o. F) Treating RN: [name] Primary Care Jerusha Reising: Thereasa Distance Other Clinician: Referring Teyana Pierron: Thereasa Distance Treating Barclay Lennox/Extender: Melburn Hake, HOYT Weeks in Treatment: 16 Vital Signs Height(in): 63 Pulse(bpm): 65 Weight(lbs): 102 Blood Pressure(mmHg): 166/66 Body Mass Index(BMI): 18 Temperature(F): 97.7 Respiratory Rate 16 (breaths/min): Photos: [N/A:N/A] Wound Location: Right Achilles N/A N/A Wounding Event: Gradually Appeared N/A N/A Primary Etiology: Arterial Insufficiency Ulcer N/A  N/A Secondary Etiology: Diabetic Wound/Ulcer of the N/A N/A Lower Extremity Comorbid History: Anemia, Arrhythmia, N/A N/A Congestive Heart Failure, Hypertension, Type II Diabetes, End Stage Renal Disease, Osteoarthritis Date Acquired: 04/01/2018 N/A N/A Weeks of Treatment: 16 N/A N/A Wound Status: Open N/A N/A Pending Amputation on Yes N/A N/A Presentation: Measurements L x W x D 4x2.3x0.1 N/A N/A (cm) Area (cm) : 7.226 N/A N/A Volume (cm) : 0.723 N/A N/A % Reduction in Area: -51.30% N/A N/A % Reduction in Volume: -51.30% N/A N/A Classification: Full Thickness With Exposed N/A  N/A Support Structures Exudate Amount: Large N/A N/A Exudate Type: Serous N/A N/A Exudate Color: amber N/A N/A Wound Margin: Flat and Intact N/A N/A Granulation Amount: None Present (0%) N/A N/A Necrotic Amount: Large (67-100%) N/A N/A Younce, Cortnee J. ([id number]) Necrotic Tissue: Eschar, Adherent Slough N/A N/A Exposed Structures: Fat Layer (Subcutaneous N/A N/A Tissue) Exposed: Yes Tendon: Yes Fascia: No Muscle: No Joint: No Bone: No Epithelialization: None N/A N/A Treatment Notes Electronic Signature(s) Signed: 09/22/2018 5:04:52 PM By: [name] Entered By: [name] on 09/22/2018 10:30:04 Burress, [name] ([id number]) -------------------------------------------------------------------------------- Multi-Disciplinary Care Plan Details Patient Name: [name] Date of Service: 09/22/2018 10:15 AM Medical Record Number: [id number] Patient Account Number: [id number] Date of Birth/Sex: 1926/03/18 (85 y.o. F) Treating RN: [name] Primary Care Anntoinette Haefele: Thereasa Distance Other Clinician: Referring Dayana Dalporto: Thereasa Distance Treating Tiffanni Scarfo/Extender: Melburn Hake, HOYT Weeks in Treatment: 16 Active Inactive Abuse / Safety / Falls / Self Care Management Nursing Diagnoses: Potential for falls Goals: Patient will not experience any injury related to falls Date Initiated:  06/16/2018 Target Resolution Date: 09/16/2018 Goal Status: Active Interventions: Assess fall risk on admission and as needed Notes: Necrotic Tissue Nursing Diagnoses: Knowledge deficit related to management of necrotic/devitalized tissue Goals: Patient/caregiver will verbalize understanding of reason and process for debridement of necrotic tissue Date Initiated: 06/16/2018 Target Resolution Date: 09/16/2018 Goal Status: Active Interventions: Provide education on necrotic tissue and debridement process Notes: Nutrition Nursing Diagnoses: Imbalanced nutrition Goals: Patient/caregiver agrees to and verbalizes understanding of need to use nutritional supplements and/or vitamins as prescribed Date Initiated: 06/16/2018 Target Resolution Date: 09/16/2018 Goal Status: Active Interventions: Assess patient nutrition upon admission and as needed per policy CESIA, ORF ([id number]) Notes: Wound/Skin Impairment Nursing Diagnoses: Impaired tissue integrity Knowledge deficit related to ulceration/compromised skin integrity Goals: Ulcer/skin breakdown will have a volume reduction of 30% by week 4 Date Initiated: 06/02/2018 Target Resolution Date: 07/01/2018 Goal Status: Active Interventions: Assess patient/caregiver ability to obtain necessary supplies Assess patient/caregiver ability to perform ulcer/skin care regimen upon admission and as needed Assess ulceration(s) every visit Notes: Electronic Signature(s) Signed: 09/22/2018 5:04:52 PM By: [name] Entered By: [name] on 09/22/2018 10:29:49 Bixler, [name] ([id number]) -------------------------------------------------------------------------------- Pain Assessment Details Patient Name: [name] Date of Service: 09/22/2018 10:15 AM Medical Record Number: [id number] Patient Account Number: [id number] Date of Birth/Sex: [birth year] (85 y.o. F) Treating RN: Harold Barban Primary Care Daron Stutz: Thereasa Distance Other  Clinician: Referring Jashanti Clinkscale: Thereasa Distance Treating Rajohn Henery/Extender: Melburn Hake, HOYT Weeks in Treatment: 16 Active Problems Location of Pain Severity and Description of Pain Patient Has Paino No Site Locations Pain Management and Medication Current Pain Management: Electronic Signature(s) Signed: 09/22/2018 4:12:52 PM By: Harold Barban Entered By: Harold Barban on 09/22/2018 10:14:19 Ganoe, [name] ([id number]) -------------------------------------------------------------------------------- Patient/Caregiver Education Details Patient Name: [name] Date of Service: 09/22/2018 10:15 AM Medical Record Number: [id number] Patient Account Number: [id number] Date of Birth/Gender: Aug 17, 1925 (85 y.o. F) Treating RN: [name] Primary Care Physician: Thereasa Distance Other Clinician: Referring Physician: Thereasa Distance Treating Physician/Extender: Sharalyn Ink in Treatment: 16 Education Assessment Education Provided To: Patient and Caregiver Education Topics Provided Wound/Skin Impairment: Handouts: Other: wound care as ordered Methods: Demonstration, Explain/Verbal Responses: State content correctly Electronic Signature(s) Signed: 09/22/2018 5:04:52 PM By: [name] Entered By: [name] on 09/22/2018 10:34:57 Ekblad, [name] ([id number]) -------------------------------------------------------------------------------- Wound Assessment Details Patient Name: [name] Date of Service: 09/22/2018 10:15 AM Medical Record Number: [id number] Patient Account Number: [id number] Date of Birth/Sex: [birth year] (85  y.o. 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 Wounding Event: Gradually Appeared Etiology: Date Acquired: 04/01/2018 Wound Open Weeks Of Treatment: 16 Status: Clustered Wound: No Comorbid Anemia, Arrhythmia, Congestive Heart Failure, Pending Amputation On Presentation History: Hypertension, Type II Diabetes, End Stage Renal Disease, Osteoarthritis Photos Wound Measurements Length: (cm) 4 Width: (cm) 2.3 Depth: (cm) 0.1 Area: (cm) 7.226 Volume: (cm) 0.723 % Reduction in Area: -51.3% % Reduction in Volume: -51.3% Epithelialization: None Tunneling: No Undermining: No Wound Description Full Thickness With Exposed Support Foul Odor Classification: Structures Slough/Fi Wound Margin: Flat and Intact Exudate Large Amount: Exudate Type: Serous Exudate Color: amber After Cleansing: No brino Yes Wound Bed Granulation Amount: None Present (0%) Exposed Structure Necrotic Amount: Large (67-100%) Fascia Exposed: No Necrotic Quality: Eschar, Adherent Slough Fat Layer (Subcutaneous Tissue) Exposed: Yes Tendon Exposed: Yes Muscle Exposed: No Joint Exposed: No Harps, Bushra J. ([id number]) Bone Exposed: No Treatment Notes Wound #1 (Right Achilles) Notes prisma, xeroform, abd, conform to secure Electronic Signature(s) Signed: 09/22/2018 4:12:52 PM By: Harold Barban Entered By: Harold Barban on 09/22/2018 10:21:09 Piechowski, [name] -------------------------------------------------------------------------------- Vitals Details Patient Name: [name] Date of Service: 09/22/2018 10:15 AM Medical Record Number: [id number] Patient Account Number: [id number] Date of Birth/Sex: Apr 30, 1925 (85 y.o. F) Treating RN: Harold Barban Primary Care Anan Dapolito: Thereasa Distance Other Clinician: Referring Cayci Mcnabb: Thereasa Distance Treating Sarah-Jane Nazario/Extender: Melburn Hake, HOYT Weeks in Treatment: 16 Vital Signs Time Taken: 10:15 Temperature (F): 97.7 Height (in): 63 Pulse (bpm): 65 Weight  (lbs): 102 Respiratory Rate (breaths/min): 16 Body Mass Index (BMI): 18.1 Blood Pressure (mmHg): 166/66 Reference Range: 80 - 120 mg / dl Electronic Signature(s) Signed: 09/22/2018 4:12:52 PM By: Harold Barban Entered By: Harold Barban on 09/22/2018 10:16:30

## 2018-09-28 ENCOUNTER — Inpatient Hospital Stay: Admission: RE | Admit: 2018-09-28 | Payer: Medicare Other | Source: Ambulatory Visit

## 2018-09-29 ENCOUNTER — Encounter: Payer: Medicare Other | Admitting: Physician Assistant

## 2018-09-29 ENCOUNTER — Other Ambulatory Visit: Payer: Self-pay

## 2018-09-29 DIAGNOSIS — E11622 Type 2 diabetes mellitus with other skin ulcer: Secondary | ICD-10-CM | POA: Diagnosis not present

## 2018-09-30 NOTE — Progress Notes (Signed)
JANAE, BONSER ([id number]) Visit Report for 09/29/2018 Chief Complaint Document Details Patient Name: EVELINE, SAUVE Date of Service: 09/29/2018 10:45 AM Medical Record Number: [id number] Patient Account Number: [id number] Date of Birth/Sex: 1926-01-07 (85 y.o. F) Treating RN: [name] Primary Care Provider: Thereasa Distance Other Clinician: Referring Provider: Thereasa Distance Treating Provider/Extender: Melburn Hake, HOYT Weeks in Treatment: 17 Information Obtained from: Patient Chief Complaint Right Lower leg Achilles ulcer Electronic Signature(s) Signed: 09/29/2018 5:06:58 PM By: Worthy Keeler PA-C Entered By: Worthy Keeler on 09/29/2018 10:50:25 Hulme, [name] ([id number]) -------------------------------------------------------------------------------- HPI Details Patient Name: [name] Date of Service: 09/29/2018 10:45 AM Medical Record Number: [id number] Patient Account Number: [id number] Date of Birth/Sex: Aug 25, 1925 (85 y.o. F) Treating RN: [name] Primary Care Provider: Thereasa Distance Other Clinician: Referring Provider: Thereasa Distance Treating Provider/Extender: Melburn Hake, HOYT Weeks in Treatment: 17 History of Present Illness HPI Description: 06/02/18 patient presents today for initial evaluation our clinic concerning the ulcers that she has on the right Achilles location. 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 She was subsequently supposed to have had a scheduled likely stent placement in December 2019 unfortunately she had a fall with a sub dural hematoma which subsequently had to be evacuated partially at least. For that reason she has never gotten back in with Dr. Delana Meyer to have the vascular procedure. She does not have any appointment scheduled with him at this time. The patient does have a history of chronic kidney disease stage III, congestive heart failure, trophy relation, and at  this point home health who has been coming out has been using [facility]. This is been since she's been home towards the end of December as best we can tell. She does have a hemoglobin A1c of 7.0 which was obtained on 11/18/17. Currently the patient's right lower extremity that showed diminished blood flow according to her arterial study which is dated 02/27/18. At that point her right ABI was 0.89 with a TBI of 0.36 and her left ABI was 1.13 with a TBI of 0.50. Her prior study which was in May on the 13th 2019 showed that she had a right ABI of one and a right TBI of 0.51 with a left ABI of 0.72 and a TBI of 0.50. Obviously since May till now her right leg has diminished as far as the blood flows concerned patient's son who was with her today states that Dr. 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 I do believe that for limb salvage this patient needs to have the above stated procedure with Dr. Delana Meyer as soon as possible. 06/09/18 on evaluation today patient actually appears to be doing about the same in regard to her Achilles ulcer. This actually does have tended exposed however the tenant is remaining moist which is good news that's exactly what we want to see. Fortunately there's no signs of infection she does have an appointment with Dr. Delana Meyer this coming Tuesday, June 13, 2018. 06/16/18 on evaluation today patient actually appears to be doing rather well in regard to her right lower extremity at this point. The Achilles ulcer actually showed signs of being a little bit better as far as the overall appearance of the wound was concerned today. Fortunately there did not appear to be the evidence of infection at this time which is good news. With that being said she did have her angiogram where she did actually  have a significant blockage of the 90% into her lower extremity. Fortunately between the balloon angioplasty followed by stating this was able to be improved to  less than 5% residual stenosis according to Dr. Nino Parsley note that I read. She still may have some limitation into her foot in particular as far as the runoff is concerned but nonetheless in general appears to be doing much better which is great news. Fortunately there is no sign of infection at this time. 06/23/18 on evaluation today patient's wound bed actually appears to be doing about the same although the central portion of the tendon actually appears to be somewhat dry this is definitely not good news. We want to try to keep this moist while allowing the edges of the wound hopefully start to cover the tendon that is the goal. Nonetheless I think that we may need to switch things up a little bit to try to improve the moisture trapping at this point. She does have better blood flow which is great we have applied for EpiFix although unfortunately as of right now this shows to be not covered. I think that we need to try to see about getting this approved as with the tenant expose me to do everything we can to try to get this covering of the skin as soon as possible. Again I'm not even sure that she'd be a candidate for plastic surgery intervention. 06/30/18 on evaluation today patient actually appears to be doing about the same in regard to her wound on the right Achilles region. The Achilles tendon is still exposed there does not appear to be any tissue growth around the edge while the tendon itself does not appear to be quite as dry as it was previous there's definitely necrosis of the tenant beginning to be noted unfortunately. For that reason I think that we really need to make an appointment for her to see a specialist in order to discuss the options with her in regard to this one. Fortunately there does not appear to be any signs of systemic infection or even local infection for that matter. 07/07/18 on evaluation today patient appears to be doing about the same in regard to her Achilles  region also. There does not appear to be any signs of active infection at this time which is good news. With that being said her Achilles tendon is very dry and appears to be more so due to the fact that apparently the last dressing that was put on was used hydrogel with a Dry gauze of the top not what had been previously recommended. For that reason again the tendon does appear to be quite a bit [place], Chrystine J. ([id number]) more dry than what it was previous unfortunately. They still have not heard from orthopedics is for the referral for her Achilles region. We had made this referral last week on Tuesday the sign still hadn't heard anything and the office told him that they did not have a referral from [place] we therefore reset the referral then he still has not heard anything. 07/14/18 on evaluation today patient appears to be doing about the same in regard to her right Achilles ulcer. She has been tolerating the dressing changes without complication. With that being said nothing seems to really improve significantly in my pinion. She did see the orthopedic doctor earlier this week they recommended a referral to Dr. Vickki Muff her podiatrist for further evaluation as well as a potential referral to plastic surgery although that is not  scheduled as of yet. The patient is scheduled to see her podiatrist on Monday. There's no signs of active infection at this time. 07/21/18 on evaluation today patient was actually seen on 07/17/18 by Dr. Vickki Muff who is a local podiatrist. Subsequently he did sharply debride in the office the wound to some degree to try to clear off the eschar and see how much of the tenant underlying was actually damaged. He however felt that with the patient really needed was more significant debridement of the Achilles tendon to clear this away and that a Wound VAC following. However he considers this a semi-elective surgery which subsequently the hospitals not allowing at this point. Nonetheless  the patient son was present during the evaluation that she had there with the podiatrist and did we count that exactly what I was saying was exactly what he remembered as well. No fevers, chills, nausea, or vomiting noted at this time. 07/28/18 on evaluation today patient appears to be doing a little better in regard to the necrotic tendon loosen up on the surface of her wound. She's been tolerating the dressing changes without complication. That does not appear to be any signs of active infection at this time which is good news. I'll be said that something we're trying to avoid. She does see Dr. Vickki Muff on Monday and just a couple of days. 08/04/18 on evaluation today patient appears to be doing about the same in regard to her wound on the posterior Achilles region. The tendon is still softening up but does not appear to show any signs of infection at this time. She did see her podiatrist on Monday he still feels like she needs surgery to remove the tendon in its entirety although right now again still with the hospital lockdown there's no indication that this can be done. Again this is due to the nature of surgeries that are allowed and hers apparently does not fall in the category of essential. Nonetheless I think that this is something that needs to be done sooner rather than later based on what I'm seeing. 08/11/18 on evaluation today patient appears to be doing about the same that she does have some erythema around the edge of the wound. This is coupled with the fact she's had more drainage as well as increased pain all of which combine to make me concerned about the possibility of infection. Fortunately there's no signs of systemic infection. No fevers, chills, nausea, or vomiting noted at this time. 08/18/18 on evaluation today patient actually appears to be doing about the same though a little bit of the necrotic tendon is actually softening up with use of the Santyl. Fortunately there's no signs  of active infection at this time. She's been tolerating the dressing changes without complication. 09/01/18 on evaluation today patient actually appears to be doing much better compared to what I've seen in the past out of her wound. I'm actually very pleased with the progress that she's made at this point even compared to when I saw her last on the eighth. There is no signs of active infection at this time. No fevers, chills, nausea, or vomiting noted at this time. 09/15/18 on evaluation today patient actually appears to be doing excellent in regard to her right Achilles ulcer. She has been tolerating the dressing changes without complication. The good news is the wound is actually healing from the inside out and she has a lot of good granulation in and around the tendon. I think that she is  actually at the point where we may be able to go ahead and initiate therapy with [employer] in order to get this healed more rapidly and prevent any risk of infection. The patient and her son are in agreement with this plan. Unfortunately and incidentally she knows that she's been having issues with her hearing over the past week this seems to be dramatically diminished according to her sign. Nonetheless she has not see anyone with regard to this yet I think she may need to check with [place] here knows that upstairs to see if there's anything they can do for evaluation and treatment in this regard. 09/22/18 on evaluation today patient appears to be doing well in regard to her right Achilles ulcer. She has been tolerating the dressing changes without complication. Fortunately there's no signs of active infection at this time. Overall been very pleased with the progress and how she has done up to this point. No fevers, chills, nausea, or vomiting noted at this time. At this time were still waiting to hear back on the [employer] whether or not this will be approved. 09/29/18 on evaluation today patient actually appears  to be doing very well which is excellent news. Fortunately there's no signs of active infection at this time which is also excellent news. Overall very pleased with the progress and were things stand. She's having no discomfort which is excellent news and there is great granulation in the base of the wound. ALTHIA, EGOLF ([id number]) Electronic Signature(s) Signed: 09/29/2018 5:06:58 PM By: Worthy Keeler PA-C Entered By: Worthy Keeler on 09/29/2018 11:08:12 Reinders, [name] ([id number]) -------------------------------------------------------------------------------- Physical Exam Details Patient Name: [name] Date of Service: 09/29/2018 10:45 AM Medical Record Number: [id number] Patient Account Number: [id number] Date of Birth/Sex: November 01, 1925 (85 y.o. F) Treating RN: [name] Primary Care Provider: Thereasa Distance Other Clinician: Referring Provider: Thereasa Distance Treating Provider/Extender: Melburn Hake, HOYT Weeks in Treatment: 6 Constitutional Well-nourished and well-hydrated in no acute distress. Respiratory normal breathing without difficulty. clear to auscultation bilaterally. Cardiovascular regular rate and rhythm with normal S1, S2. Psychiatric this patient is able to make decisions and demonstrates good insight into disease process. Alert and Oriented x 3. pleasant and cooperative. Notes My suggestion at this time is gonna be that we go ahead and forgo any sharp debridement the wound seems to be doing quite well which is great news. Overall very pleased with the progress and were things stand at this time. Electronic Signature(s) Signed: 09/29/2018 5:06:58 PM By: Worthy Keeler PA-C Entered By: Worthy Keeler on 09/29/2018 11:08:41 Hepworth, [name] ([id number]) -------------------------------------------------------------------------------- Physician Orders Details Patient Name: [name] Date of Service: 09/29/2018 10:45 AM Medical Record Number:  [id number] Patient Account Number: [id number] Date of Birth/Sex: 1925-11-21 (85 y.o. F) Treating RN: [name] Primary Care Provider: Thereasa Distance Other Clinician: Referring Provider: Thereasa Distance Treating Provider/Extender: Melburn Hake, HOYT Weeks in Treatment: 35 Verbal / Phone Orders: No Diagnosis Coding ICD-10 Coding Code Description I73.89 Other specified peripheral vascular diseases L97.818 Non-pressure chronic ulcer of other part of right lower leg with other specified severity N18.3 Chronic kidney disease, stage 3 (moderate) I48.20 Chronic atrial fibrillation, unspecified Wound Cleansing Wound #1 Right Achilles o Clean wound with Normal Saline. Primary Wound Dressing Wound #1 Right Achilles o Silver Collagen - to wound bed o Xeroform - over silver collagen to trap moisture on tendon Secondary Dressing Wound #1 Right Achilles o ABD and Kerlix/Conform Dressing Change Frequency Wound #1 Right Achilles o  Change Dressing Monday, Wednesday, Friday Follow-up Appointments Wound #1 Right Achilles o Return Appointment in 1 week. Home Health Wound #1 Right Achilles o Continue Home Health Visits o Home Health Nurse may visit PRN to address patientos wound care needs. o FACE TO FACE ENCOUNTER: MEDICARE and MEDICAID PATIENTS: I certify that this patient is under my care and that I had a face-to-face encounter that meets the physician face-to-face encounter requirements with this patient on this date. The encounter with the patient was in whole or in part for the following MEDICAL CONDITION: (primary reason for [place]) MEDICAL NECESSITY: I certify, that based on my findings, NURSING services are a medically necessary home health service. HOME BOUND STATUS: I certify that my clinical findings support that this patient is homebound (i.e., Due to illness or injury, pt requires aid of supportive devices such as crutches, cane, wheelchairs, walkers,  the use of special transportation or the assistance of another person to leave their place of residence. There is a normal inability to leave the home and doing so requires considerable and taxing effort. Other absences are for medical reasons / religious services and are infrequent or of short duration when for other reasons). AUBRIONNA, ISTRE ([id number]) o If current dressing causes regression in wound condition, may D/C ordered dressing product/s and apply Normal Saline Moist Dressing daily until next [facility] / Other MD appointment. Chauncey of regression in wound condition at [phone]. o Please direct any NON-WOUND related issues/requests for orders to patient's Primary Care Physician Electronic Signature(s) Signed: 09/29/2018 5:01:18 PM By: [name] Signed: 09/29/2018 5:06:58 PM By: Worthy Keeler PA-C Entered By: [name] on 09/29/2018 11:06:24 [place], [name] ([id number]) -------------------------------------------------------------------------------- Problem List Details Patient Name: [name] Date of Service: 09/29/2018 10:45 AM Medical Record Number: [id number] Patient Account Number: [id number] Date of Birth/Sex: January 01, 1926 (85 y.o. F) Treating RN: [name] Primary Care Provider: Thereasa Distance Other Clinician: Referring Provider: Thereasa Distance Treating Provider/Extender: Melburn Hake, HOYT Weeks in Treatment: 73 Active Problems ICD-10 Evaluated Encounter Code Description Active Date Today Diagnosis I73.89 Other specified peripheral vascular diseases 06/02/2018 No Yes L97.818 Non-pressure chronic ulcer of other part of right lower leg 06/02/2018 No Yes with other specified severity N18.3 Chronic kidney disease, stage 3 (moderate) 06/02/2018 No Yes I48.20 Chronic atrial fibrillation, unspecified 06/02/2018 No Yes Inactive Problems Resolved Problems Electronic Signature(s) Signed: 09/29/2018 5:06:58 PM By: Worthy Keeler PA-C Entered By: Worthy Keeler on 09/29/2018 10:50:18 Cupo, [name] ([id number]) -------------------------------------------------------------------------------- Progress Note Details Patient Name: [name] Date of Service: 09/29/2018 10:45 AM Medical Record Number: [id number] Patient Account Number: [id number] Date of Birth/Sex: [birth year] (85 y.o. F) Treating RN: [name] Primary Care Provider: Thereasa Distance Other Clinician: Referring Provider: Thereasa Distance Treating Provider/Extender: Melburn Hake, HOYT Weeks in Treatment: 17 Subjective Chief Complaint Information obtained from Patient Right Lower leg Achilles ulcer History of Present Illness (HPI) 06/02/18 patient presents today for initial evaluation our clinic concerning the ulcers that she has on the right Achilles location. 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 She was subsequently supposed to have had a scheduled likely stent placement in December 2019 unfortunately she had a fall with a sub dural hematoma which subsequently had to be evacuated partially at least. For that reason she has never gotten back in with Dr. Delana Meyer to have the vascular procedure. She does not have any appointment scheduled with him  at this time. The patient does have a history of chronic kidney disease stage III, congestive heart failure, trophy relation, and at this point home health who has been coming out has been using [facility]. This is been since she's been home towards the end of December as best we can tell. She does have a hemoglobin A1c of 7.0 which was obtained on 11/18/17. Currently the patient's right lower extremity that showed diminished blood flow according to her arterial study which is dated 02/27/18. At that point her right ABI was 0.89 with a TBI of 0.36 and her left ABI was 1.13 with a TBI of 0.50. Her prior study which was in May on the 13th  2019 showed that she had a right ABI of one and a right TBI of 0.51 with a left ABI of 0.72 and a TBI of 0.50. Obviously since May till now her right leg has diminished as far as the blood flows concerned patient's son who was with her today states that Dr. 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 I do believe that for limb salvage this patient needs to have the above stated procedure with Dr. Delana Meyer as soon as possible. 06/09/18 on evaluation today patient actually appears to be doing about the same in regard to her Achilles ulcer. This actually does have tended exposed however the tenant is remaining moist which is good news that's exactly what we want to see. Fortunately there's no signs of infection she does have an appointment with Dr. Delana Meyer this coming Tuesday, June 13, 2018. 06/16/18 on evaluation today patient actually appears to be doing rather well in regard to her right lower extremity at this point. The Achilles ulcer actually showed signs of being a little bit better as far as the overall appearance of the wound was concerned today. Fortunately there did not appear to be the evidence of infection at this time which is good news. With that being said she did have her angiogram where she did actually have a significant blockage of the 90% into her lower extremity. Fortunately between the balloon angioplasty followed by stating this was able to be improved to less than 5% residual stenosis according to Dr. Nino Parsley note that I read. She still may have some limitation into her foot in particular as far as the runoff is concerned but nonetheless in general appears to be doing much better which is great news. Fortunately there is no sign of infection at this time. 06/23/18 on evaluation today patient's wound bed actually appears to be doing about the same although the central portion of the tendon actually appears to be somewhat dry this is definitely not good  news. We want to try to keep this moist while allowing the edges of the wound hopefully start to cover the tendon that is the goal. Nonetheless I think that we may need to switch things up a little bit to try to improve the moisture trapping at this point. She does have better blood flow which is great we have applied for EpiFix although unfortunately as of right now this shows to be not covered. I think that we need to try to see about getting this approved as with the tenant expose me to do everything we can to try to get this covering of the skin as soon as possible. Again I'm not even sure that she'd be a candidate for plastic surgery intervention. 06/30/18 on evaluation today patient actually appears to be doing  about the same in regard to her wound on the right Achilles region. The Achilles tendon is still exposed there does not appear to be any tissue growth around the edge while the tendon itself does not appear to be quite as dry as it was previous there's definitely necrosis of the tenant beginning to be noted unfortunately. For that reason I think that we really need to make an appointment for her to see a specialist in order to discuss the options with her in regard to this one. Fortunately there does not appear to be any signs of systemic infection or even local Wechter, Sondi J. ([id number]) infection for that matter. 07/07/18 on evaluation today patient appears to be doing about the same in regard to her Achilles region also. There does not appear to be any signs of active infection at this time which is good news. With that being said her Achilles tendon is very dry and appears to be more so due to the fact that apparently the last dressing that was put on was used hydrogel with a Dry gauze of the top not what had been previously recommended. For that reason again the tendon does appear to be quite a bit more dry than what it was previous unfortunately. They still have not heard from  orthopedics is for the referral for her Achilles region. We had made this referral last week on Tuesday the sign still hadn't heard anything and the office told him that they did not have a referral from [place] we therefore reset the referral then he still has not heard anything. 07/14/18 on evaluation today patient appears to be doing about the same in regard to her right Achilles ulcer. She has been tolerating the dressing changes without complication. With that being said nothing seems to really improve significantly in my pinion. She did see the orthopedic doctor earlier this week they recommended a referral to Dr. Vickki Muff her podiatrist for further evaluation as well as a potential referral to plastic surgery although that is not scheduled as of yet. The patient is scheduled to see her podiatrist on Monday. There's no signs of active infection at this time. 07/21/18 on evaluation today patient was actually seen on 07/17/18 by Dr. Vickki Muff who is a local podiatrist. Subsequently he did sharply debride in the office the wound to some degree to try to clear off the eschar and see how much of the tenant underlying was actually damaged. He however felt that with the patient really needed was more significant debridement of the Achilles tendon to clear this away and that a Wound VAC following. However he considers this a semi-elective surgery which subsequently the hospitals not allowing at this point. Nonetheless the patient son was present during the evaluation that she had there with the podiatrist and did we count that exactly what I was saying was exactly what he remembered as well. No fevers, chills, nausea, or vomiting noted at this time. 07/28/18 on evaluation today patient appears to be doing a little better in regard to the necrotic tendon loosen up on the surface of her wound. She's been tolerating the dressing changes without complication. That does not appear to be any signs of active infection at  this time which is good news. I'll be said that something we're trying to avoid. She does see Dr. Vickki Muff on Monday and just a couple of days. 08/04/18 on evaluation today patient appears to be doing about the same in regard to her wound on the  posterior Achilles region. The tendon is still softening up but does not appear to show any signs of infection at this time. She did see her podiatrist on Monday he still feels like she needs surgery to remove the tendon in its entirety although right now again still with the hospital lockdown there's no indication that this can be done. Again this is due to the nature of surgeries that are allowed and hers apparently does not fall in the category of essential. Nonetheless I think that this is something that needs to be done sooner rather than later based on what I'm seeing. 08/11/18 on evaluation today patient appears to be doing about the same that she does have some erythema around the edge of the wound. This is coupled with the fact she's had more drainage as well as increased pain all of which combine to make me concerned about the possibility of infection. Fortunately there's no signs of systemic infection. No fevers, chills, nausea, or vomiting noted at this time. 08/18/18 on evaluation today patient actually appears to be doing about the same though a little bit of the necrotic tendon is actually softening up with use of the Santyl. Fortunately there's no signs of active infection at this time. She's been tolerating the dressing changes without complication. 09/01/18 on evaluation today patient actually appears to be doing much better compared to what I've seen in the past out of her wound. I'm actually very pleased with the progress that she's made at this point even compared to when I saw her last on the eighth. There is no signs of active infection at this time. No fevers, chills, nausea, or vomiting noted at this time. 09/15/18 on evaluation today patient  actually appears to be doing excellent in regard to her right Achilles ulcer. She has been tolerating the dressing changes without complication. The good news is the wound is actually healing from the inside out and she has a lot of good granulation in and around the tendon. I think that she is actually at the point where we may be able to go ahead and initiate therapy with [employer] in order to get this healed more rapidly and prevent any risk of infection. The patient and her son are in agreement with this plan. Unfortunately and incidentally she knows that she's been having issues with her hearing over the past week this seems to be dramatically diminished according to her sign. Nonetheless she has not see anyone with regard to this yet I think she may need to check with [facility] here knows that upstairs to see if there's anything they can do for evaluation and treatment in this regard. 09/22/18 on evaluation today patient appears to be doing well in regard to her right Achilles ulcer. She has been tolerating the dressing changes without complication. Fortunately there's no signs of active infection at this time. Overall been very pleased Eichhorn, Cashae J. ([id number]) with the progress and how she has done up to this point. No fevers, chills, nausea, or vomiting noted at this time. At this time were still waiting to hear back on the [employer] whether or not this will be approved. 09/29/18 on evaluation today patient actually appears to be doing very well which is excellent news. Fortunately there's no signs of active infection at this time which is also excellent news. Overall very pleased with the progress and were things stand. She's having no discomfort which is excellent news and there is great granulation in the base of the  wound. Patient History Information obtained from Patient. Family History Cancer - Siblings, Heart Disease - Mother,Siblings,Father, Hypertension - Mother,Father,Siblings,  Stroke - Father, No family history of Diabetes, Hereditary Spherocytosis, Kidney Disease, Lung Disease, Seizures, Thyroid Problems, Tuberculosis. Social History Never smoker, Marital Status - Married, Alcohol Use - Never, Drug Use - No History, Caffeine Use - Daily. Medical History Eyes Denies history of Cataracts, Glaucoma Ear/Nose/Mouth/Throat Denies history of Chronic sinus problems/congestion, Middle ear problems Hematologic/Lymphatic Patient has history of Anemia Denies history of Hemophilia, Human Immunodeficiency Virus, Lymphedema, Sickle Cell Disease Respiratory Denies history of Aspiration, Asthma, Chronic Obstructive Pulmonary Disease (COPD), Pneumothorax, Sleep Apnea, Tuberculosis Cardiovascular Patient has history of Arrhythmia - a fib, Congestive Heart Failure, Hypertension Denies history of Angina, Coronary Artery Disease, Deep Vein Thrombosis, Hypotension, Myocardial Infarction, Peripheral Arterial Disease, Peripheral Venous Disease, Phlebitis, Vasculitis Gastrointestinal Denies history of Cirrhosis , Colitis, Crohn s, Hepatitis A, Hepatitis B, Hepatitis C Endocrine Patient has history of Type II Diabetes Denies history of Type I Diabetes Genitourinary Patient has history of End Stage Renal Disease - CKD stage 3 Immunological Denies history of Lupus Erythematosus, Raynaud s, Scleroderma Integumentary (Skin) Denies history of History of Burn, History of pressure wounds Musculoskeletal Patient has history of Osteoarthritis Denies history of Gout, Rheumatoid Arthritis, Osteomyelitis Neurologic Denies history of Dementia, Neuropathy, Quadriplegia, Paraplegia, Seizure Disorder Oncologic Denies history of Received Chemotherapy, Received Radiation Psychiatric Denies history of Anorexia/bulimia, Confinement Anxiety Medical And Surgical History Notes Cardiovascular HLD, mitral insufficiency Musculoskeletal 4 broken ribs Carducci, Illene J. ([id number]) Review of Systems  (ROS) Constitutional Symptoms (General Health) Denies complaints or symptoms of Fatigue, Fever, Chills, Marked Weight Change. Respiratory Denies complaints or symptoms of Chronic or frequent coughs, Shortness of Breath. Cardiovascular Denies complaints or symptoms of Chest pain, LE edema. Psychiatric Denies complaints or symptoms of Anxiety, Claustrophobia. Objective Constitutional Well-nourished and well-hydrated in no acute distress. Vitals Time Taken: 10:50 AM, Height: 63 in, Weight: 102 lbs, BMI: 18.1, Temperature: 98.0 F, Pulse: 58 bpm, Respiratory Rate: 16 breaths/min, Blood Pressure: 174/70 mmHg. Respiratory normal breathing without difficulty. clear to auscultation bilaterally. Cardiovascular regular rate and rhythm with normal S1, S2. Psychiatric this patient is able to make decisions and demonstrates good insight into disease process. Alert and Oriented x 3. pleasant and cooperative. General Notes: My suggestion at this time is gonna be that we go ahead and forgo any sharp debridement the wound seems to be doing quite well which is great news. Overall very pleased with the progress and were things stand at this time. Integumentary (Hair, Skin) Wound #1 status is Open. Original cause of wound was Gradually Appeared. The wound is located on the Right Achilles. The wound measures 3.8cm length x 2.6cm width x 0.1cm depth; 7.76cm^2 area and 0.776cm^3 volume. There is tendon and Fat Layer (Subcutaneous Tissue) Exposed exposed. There is no tunneling or undermining noted. There is a medium amount of serous drainage noted. The wound margin is flat and intact. There is small (1-33%) granulation within the wound bed. There is a large (67-100%) amount of necrotic tissue within the wound bed including Adherent Slough. Assessment Active Problems ICD-10 Other specified peripheral vascular diseases Rollyson, Tama J. ([id number]) Non-pressure chronic ulcer of other part of right lower leg  with other specified severity Chronic kidney disease, stage 3 (moderate) Chronic atrial fibrillation, unspecified Plan Wound Cleansing: Wound #1 Right Achilles: Clean wound with Normal Saline. Primary Wound Dressing: Wound #1 Right Achilles: Silver Collagen - to wound bed Xeroform - over silver collagen to trap  moisture on tendon Secondary Dressing: Wound #1 Right Achilles: ABD and Kerlix/Conform Dressing Change Frequency: Wound #1 Right Achilles: Change Dressing Monday, Wednesday, Friday Follow-up Appointments: Wound #1 Right Achilles: Return Appointment in 1 week. Home Health: Wound #1 Right Achilles: [facility] Nurse may visit PRN to address patient s wound care needs. FACE TO FACE ENCOUNTER: MEDICARE and MEDICAID PATIENTS: I certify that this patient is under my care and that I had a face-to-face encounter that meets the physician face-to-face encounter requirements with this patient on this date. The encounter with the patient was in whole or in part for the following MEDICAL CONDITION: (primary reason for [place]) MEDICAL NECESSITY: I certify, that based on my findings, NURSING services are a medically necessary home health service. HOME BOUND STATUS: I certify that my clinical findings support that this patient is homebound (i.e., Due to illness or injury, pt requires aid of supportive devices such as crutches, cane, wheelchairs, walkers, the use of special transportation or the assistance of another person to leave their place of residence. There is a normal inability to leave the home and doing so requires considerable and taxing effort. Other absences are for medical reasons / religious services and are infrequent or of short duration when for other reasons). If current dressing causes regression in wound condition, may D/C ordered dressing product/s and apply Normal Saline Moist Dressing daily until next [place] / Other MD  appointment. [place] of regression in wound condition at [phone]. Please direct any NON-WOUND related issues/requests for orders to patient's Primary Care Physician We are gonna continue with the Current wound care measures for this week until we can get the approval from [facility] itself that everything has been approved ready to go from the standpoint of proceeding with the application. Subsequently if we get that approval and confirm everything I will definitely have our staff can touch with Dr. Alvera Singh office as well to let them know what has been approved were things stand in that regard. If anything changes or worsens in the meantime patient will contact the office and let me know. Please see above for specific wound care orders. We will see patient for re-evaluation in 1 week(s) here in the clinic. If anything worsens or changes patient will contact our office for additional recommendations. LAQUESHA, HOLCOMB ([id number]) Electronic Signature(s) Signed: 09/29/2018 5:06:58 PM By: Worthy Keeler PA-C Entered By: Worthy Keeler on 09/29/2018 11:09:19 Mimnaugh, [name] ([id number]) -------------------------------------------------------------------------------- ROS/PFSH Details Patient Name: [name] Date of Service: 09/29/2018 10:45 AM Medical Record Number: [id number] Patient Account Number: [id number] Date of Birth/Sex: Dec 26, 1925 (85 y.o. F) Treating RN: [name] Primary Care Provider: Thereasa Distance Other Clinician: Referring Provider: Thereasa Distance Treating Provider/Extender: Melburn Hake, HOYT Weeks in Treatment: 17 Information Obtained From Patient Constitutional Symptoms (General Health) Complaints and Symptoms: Negative for: Fatigue; Fever; Chills; Marked Weight Change Respiratory Complaints and Symptoms: Negative for: Chronic or frequent coughs; Shortness of Breath Medical History: Negative for: Aspiration; Asthma; Chronic Obstructive  Pulmonary Disease (COPD); Pneumothorax; Sleep Apnea; Tuberculosis Cardiovascular Complaints and Symptoms: Negative for: Chest pain; LE edema Medical History: Positive for: Arrhythmia - a fib; Congestive Heart Failure; Hypertension Negative for: Angina; Coronary Artery Disease; Deep Vein Thrombosis; Hypotension; Myocardial Infarction; Peripheral Arterial Disease; Peripheral Venous Disease; Phlebitis; Vasculitis Past Medical History Notes: HLD, mitral insufficiency Psychiatric Complaints and Symptoms: Negative for: Anxiety; Claustrophobia Medical History: Negative for: Anorexia/bulimia; Confinement Anxiety Eyes Medical History: Negative for: Cataracts; Glaucoma  Ear/Nose/Mouth/Throat Medical History: Negative for: Chronic sinus problems/congestion; Middle ear problems Hematologic/Lymphatic Medical History: Positive for: Anemia Miler, Barabara J. ([id number]) Negative for: Hemophilia; Human Immunodeficiency Virus; Lymphedema; Sickle Cell Disease Gastrointestinal Medical History: Negative for: Cirrhosis ; Colitis; Crohnos; Hepatitis A; Hepatitis B; Hepatitis C Endocrine Medical History: Positive for: Type II Diabetes Negative for: Type I Diabetes Treated with: Insulin Blood sugar tested every day: Yes Tested : Genitourinary Medical History: Positive for: End Stage Renal Disease - CKD stage 3 Immunological Medical History: Negative for: Lupus Erythematosus; Raynaudos; Scleroderma Integumentary (Skin) Medical History: Negative for: History of Burn; History of pressure wounds Musculoskeletal Medical History: Positive for: Osteoarthritis Negative for: Gout; Rheumatoid Arthritis; Osteomyelitis Past Medical History Notes: 4 broken ribs Neurologic Medical History: Negative for: Dementia; Neuropathy; Quadriplegia; Paraplegia; Seizure Disorder Oncologic Medical History: Negative for: Received Chemotherapy; Received Radiation Immunizations Pneumococcal Vaccine: Received  Pneumococcal Vaccination: Yes Immunization Notes: up to date Implantable Devices No devices added Family and Social History Shugars, SHIRLINE KENDLE. ([id number]) Cancer: Yes - Siblings; Diabetes: No; Heart Disease: Yes - Mother,Siblings,Father; Hereditary Spherocytosis: No; Hypertension: Yes - Mother,Father,Siblings; Kidney Disease: No; Lung Disease: No; Seizures: No; Stroke: Yes - Father; Thyroid Problems: No; Tuberculosis: No; Never smoker; Marital Status - Married; Alcohol Use: Never; Drug Use: No History; Caffeine Use: Daily; Financial Concerns: No; Food, Clothing or Shelter Needs: No; Support System Lacking: No; Transportation Concerns: No Physician Affirmation I have reviewed and agree with the above information. Electronic Signature(s) Signed: 09/29/2018 5:01:18 PM By: [name] Signed: 09/29/2018 5:06:58 PM By: Worthy Keeler PA-C Entered By: Worthy Keeler on 09/29/2018 11:08:26 [place], [name] ([id number]) -------------------------------------------------------------------------------- SuperBill Details Patient Name: [name] Date of Service: 09/29/2018 Medical Record Number: [id number] Patient Account Number: [id number] Date of Birth/Sex: 1926/03/11 (85 y.o. F) Treating RN: [name] Primary Care Provider: Thereasa Distance Other Clinician: Referring Provider: Thereasa Distance Treating Provider/Extender: Melburn Hake, HOYT Weeks in Treatment: 17 Diagnosis Coding ICD-10 Codes Code Description I73.89 Other specified peripheral vascular diseases L97.818 Non-pressure chronic ulcer of other part of right lower leg with other specified severity N18.3 Chronic kidney disease, stage 3 (moderate) I48.20 Chronic atrial fibrillation, unspecified Facility Procedures CPT4 Code: [id number] Description: 99213 - WOUND CARE VISIT-LEV 3 EST PT Modifier: Quantity: 1 Physician Procedures CPT4 Code Description: [id number] 81829 - WC PHYS LEVEL 4 - EST PT ICD-10 Diagnosis Description I73.89  Other specified peripheral vascular diseases L97.818 Non-pressure chronic ulcer of other part of right lower leg wit N18.3 Chronic kidney disease, stage 3  (moderate) I48.20 Chronic atrial fibrillation, unspecified Modifier: h other specified Quantity: 1 severity Electronic Signature(s) Signed: 09/29/2018 5:01:18 PM By: [name] Signed: 09/29/2018 5:06:58 PM By: Worthy Keeler PA-C Entered By: [name] on 09/29/2018 11:41:24

## 2018-10-03 ENCOUNTER — Other Ambulatory Visit
Admission: RE | Admit: 2018-10-03 | Discharge: 2018-10-03 | Disposition: A | Discharge status: Home / Self Care | Payer: Medicare Other | Source: Ambulatory Visit | Attending: Vascular Surgery | Admitting: Vascular Surgery

## 2018-10-03 NOTE — Progress Notes (Addendum)
[name], [name] ([id number]) Visit Report for 09/29/2018 Arrival Information Details Patient Name: [name] Date of Service: 09/29/2018 10:45 AM Medical Record Number: [id number] Patient Account Number: [id number] Date of Birth/Sex: 01-16-1926 (85 y.o. F) Treating RN: Cornell Barman Primary Care [name]: [name] Other Clinician: Referring [name]: [name] Treating Iyani Dresner/Extender: Melburn Hake, HOYT Weeks in Treatment: 75 Visit Information History Since Last Visit Added or deleted any medications: No Patient Arrived: Wheel Chair Any new allergies or adverse reactions: No Arrival Time: 10:47 Had a fall or experienced change in No Accompanied By: son activities of daily living that may affect Transfer Assistance: None risk of falls: Patient Identification Verified: Yes Signs or symptoms of abuse/neglect since last visito No Secondary Verification Process Completed: Yes Hospitalized since last visit: No Patient Has Alerts: Yes Implantable device outside of the clinic excluding No Patient Alerts: DMII cellular tissue based products placed in the center since last visit: Has Dressing in Place as Prescribed: Yes Pain Present Now: No Electronic Signature(s) Signed: 10/02/2018 4:23:08 PM By: Gretta Cool, BSN, RN, CWS, Kim RN, BSN Entered By: Gretta Cool, BSN, RN, CWS, Kim on 09/29/2018 10:49:44 [name] ([id number]) -------------------------------------------------------------------------------- Clinic Level of Care Assessment Details Patient Name: [name] Date of Service: 09/29/2018 10:45 AM Medical Record Number: [id number] Patient Account Number: [id number] Date of Birth/Sex: [birth year] (85 y.o. F) Treating RN: [name] Primary Care [name]: [name] Other Clinician: Referring Tamya Denardo: [name] Treating Shannin Naab/Extender: Melburn Hake, HOYT Weeks in Treatment: 17 Clinic Level of Care Assessment Items TOOL 4 Quantity Score []  - Use when only  an EandM is performed on FOLLOW-UP visit 0 ASSESSMENTS - Nursing Assessment / Reassessment X - Reassessment of Co-morbidities (includes updates in patient status) 1 10 X- 1 5 Reassessment of Adherence to Treatment Plan ASSESSMENTS - Wound and Skin Assessment / Reassessment X - Simple Wound Assessment / Reassessment - one wound 1 5 []  - 0 Complex Wound Assessment / Reassessment - multiple wounds []  - 0 Dermatologic / Skin Assessment (not related to wound area) ASSESSMENTS - Focused Assessment []  - Circumferential Edema Measurements - multi extremities 0 []  - 0 Nutritional Assessment / Counseling / Intervention X- 1 5 Lower Extremity Assessment (monofilament, tuning fork, pulses) []  - 0 Peripheral Arterial Disease Assessment (using hand held doppler) ASSESSMENTS - Ostomy and/or Continence Assessment and Care []  - Incontinence Assessment and Management 0 []  - 0 Ostomy Care Assessment and Management (repouching, etc.) PROCESS - Coordination of Care X - Simple Patient / Family Education for ongoing care 1 15 []  - 0 Complex (extensive) Patient / Family Education for ongoing care X- 1 10 Staff obtains Programmer, systems, Records, Test Results / Process Orders []  - 0 Staff telephones HHA, Nursing Homes / Clarify orders / etc []  - 0 Routine Transfer to another Facility (non-emergent condition) []  - 0 Routine Hospital Admission (non-emergent condition) []  - 0 New Admissions / Biomedical engineer / Ordering NPWT, Apligraf, etc. []  - 0 Emergency Hospital Admission (emergent condition) X- 1 10 Simple Discharge Coordination [name], [name] []  - 0 Complex (extensive) Discharge Coordination PROCESS - Special Needs []  - Pediatric / Minor Patient Management 0 []  - 0 Isolation Patient Management []  - 0 Hearing / Language / Visual special needs []  - 0 Assessment of Community assistance (transportation, D/C planning, etc.) []  - 0 Additional assistance / Altered mentation []  -  0 Support Surface(s) Assessment (bed, cushion, seat, etc.) INTERVENTIONS - Wound Cleansing / Measurement X - Simple Wound Cleansing - one wound  1 5 []  - 0 Complex Wound Cleansing - multiple wounds X- 1 5 Wound Imaging (photographs - any number of wounds) []  - 0 Wound Tracing (instead of photographs) X- 1 5 Simple Wound Measurement - one wound []  - 0 Complex Wound Measurement - multiple wounds INTERVENTIONS - Wound Dressings X - Small Wound Dressing one or multiple wounds 1 10 []  - 0 Medium Wound Dressing one or multiple wounds []  - 0 Large Wound Dressing one or multiple wounds []  - 0 Application of Medications - topical []  - 0 Application of Medications - injection INTERVENTIONS - Miscellaneous []  - External ear exam 0 []  - 0 Specimen Collection (cultures, biopsies, blood, body fluids, etc.) []  - 0 Specimen(s) / Culture(s) sent or taken to Lab for analysis []  - 0 Patient Transfer (multiple staff / Civil Service fast streamer / Similar devices) []  - 0 Simple Staple / Suture removal (25 or less) []  - 0 Complex Staple / Suture removal (26 or more) []  - 0 Hypo / Hyperglycemic Management (close monitor of Blood Glucose) []  - 0 Ankle / Brachial Index (ABI) - do not check if billed separately X- 1 5 Vital Signs [name], [name] Has the patient been seen at the hospital within the last three years: Yes Total Score: 90 Level Of Care: New/Established - Level 3 Electronic Signature(s) Signed: 09/29/2018 5:01:18 PM By: [name] Entered By: [name] on 09/29/2018 11:41:15 [name] ([id number]) -------------------------------------------------------------------------------- Encounter Discharge Information Details Patient Name: [name] Date of Service: 09/29/2018 10:45 AM Medical Record Number: [id number] Patient Account Number: [id number] Date of Birth/Sex: 1925-06-14 (85 y.o. F) Treating RN: [name] Primary Care Carnel Stegman: [name] Other  Clinician: Referring [name]: [name] Treating Eden Toohey/Extender: Melburn Hake, HOYT Weeks in Treatment: 24 Encounter Discharge Information Items Discharge Condition: Stable Ambulatory Status: Wheelchair Discharge Destination: Home Transportation: Private Auto Accompanied By: son Schedule Follow-up Appointment: Yes Clinical Summary of Care: Electronic Signature(s) Signed: 09/29/2018 5:01:18 PM By: [name] Entered By: [name] on 09/29/2018 11:42:17 Mcneice, Arnold Carlson ([id number]) -------------------------------------------------------------------------------- Lower Extremity Assessment Details Patient Name: [name] Date of Service: 09/29/2018 10:45 AM Medical Record Number: [id number] Patient Account Number: [id number] Date of Birth/Sex: [birth year] (85 y.o. F) Treating RN: Cornell Barman Primary Care Kamonte Mcmichen: [name] Other Clinician: Referring Meia Emley: [name] Treating Chrisy Hillebrand/Extender: Melburn Hake, HOYT Weeks in Treatment: 17 Edema Assessment Assessed: Shirlyn Goltz: No] Patrice Paradise: No] Edema: [Left: N] [Right: o] Vascular Assessment Pulses: Dorsalis Pedis Palpable: [Right:Yes] Electronic Signature(s) Signed: 10/02/2018 4:23:08 PM By: Gretta Cool, BSN, RN, CWS, Kim RN, BSN Entered By: Gretta Cool, BSN, RN, CWS, Kim on 09/29/2018 10:56:23 Zielke, Arnold Carlson ([id number]) -------------------------------------------------------------------------------- Multi Wound Chart Details Patient Name: [name] Date of Service: 09/29/2018 10:45 AM Medical Record Number: [id number] Patient Account Number: [id number] Date of Birth/Sex: 11-Jan-1926 (85 y.o. F) Treating RN: [name] Primary Care Mairim Bade: [name] Other Clinician: Referring Ellionna Buckbee: [name] Treating Armeda Plumb/Extender: Melburn Hake, HOYT Weeks in Treatment: 17 Vital Signs Height(in): 63 Pulse(bpm): 82 Weight(lbs): 102 Blood Pressure(mmHg): 174/70 Body Mass Index(BMI): 18 Temperature(F):  98.0 Respiratory Rate 16 (breaths/min): Photos: [N/A:N/A] Wound Location: Right Achilles N/A N/A Wounding Event: Gradually Appeared N/A N/A Primary Etiology: Arterial Insufficiency Ulcer N/A N/A Secondary Etiology: Diabetic Wound/Ulcer of the N/A N/A Lower Extremity Comorbid History: Anemia, Arrhythmia, N/A N/A Congestive Heart Failure, Hypertension, Type II Diabetes, End Stage Renal Disease, Osteoarthritis Date Acquired: 04/01/2018 N/A N/A Weeks of Treatment: 17 N/A N/A Wound Status: Open N/A N/A Pending Amputation on Yes N/A N/A  Presentation: Measurements L x W x D 3.8x2.6x0.1 N/A N/A (cm) Area (cm) : 7.76 N/A N/A Volume (cm) : 0.776 N/A N/A % Reduction in Area: -62.50% N/A N/A % Reduction in Volume: -62.30% N/A N/A Classification: Full Thickness With Exposed N/A N/A Support Structures Exudate Amount: Medium N/A N/A Exudate Type: Serous N/A N/A Exudate Color: amber N/A N/A Wound Margin: Flat and Intact N/A N/A Granulation Amount: Small (1-33%) N/A N/A Necrotic Amount: Large (67-100%) N/A N/A Gavin, Allyson J. ([id number]) Exposed Structures: Fat Layer (Subcutaneous N/A N/A Tissue) Exposed: Yes Tendon: Yes Fascia: No Muscle: No Joint: No Bone: No Epithelialization: Small (1-33%) N/A N/A Treatment Notes Electronic Signature(s) Signed: 09/29/2018 5:01:18 PM By: [name] Entered By: [name] on 09/29/2018 11:04:18 Reasons, Arnold Carlson ([id number]) -------------------------------------------------------------------------------- Multi-Disciplinary Care Plan Details Patient Name: [name] Date of Service: 09/29/2018 10:45 AM Medical Record Number: [id number] Patient Account Number: [id number] Date of Birth/Sex: 1925/07/29 (85 y.o. F) Treating RN: [name] Primary Care Kashonda Sarkisyan: [name] Other Clinician: Referring Mihira Tozzi: [name] Treating Lona Six/Extender: Melburn Hake, HOYT Weeks in Treatment: 63 Active Inactive Electronic  Signature(s) Signed: 10/12/2018 10:19:20 AM By: Gretta Cool, BSN, RN, CWS, Kim RN, BSN Signed: 12/08/2018 1:03:09 PM By: [name] Previous Signature: 10/12/2018 10:18:59 AM Version By: Gretta Cool BSN, RN, CWS, Kim RN, BSN Previous Signature: 09/29/2018 5:01:18 PM Version By: [name] Entered By: Gretta Cool BSN, RN, CWS, Kim on 10/12/2018 10:19:20 Hardage, Arnold Carlson ([id number]) -------------------------------------------------------------------------------- Pain Assessment Details Patient Name: [name] Date of Service: 09/29/2018 10:45 AM Medical Record Number: [id number] Patient Account Number: [id number] Date of Birth/Sex: [birth year] (85 y.o. F) Treating RN: Cornell Barman Primary Care Addalynn Kumari: [name] Other Clinician: Referring Jasaiah Karwowski: [name] Treating Merna Baldi/Extender: Melburn Hake, HOYT Weeks in Treatment: 17 Active Problems Location of Pain Severity and Description of Pain Patient Has Paino No Site Locations Pain Management and Medication Current Pain Management: Goals for Pain Management Patient denies pain at this time. Electronic Signature(s) Signed: 10/02/2018 4:23:08 PM By: Gretta Cool, BSN, RN, CWS, Kim RN, BSN Entered By: Gretta Cool, BSN, RN, CWS, Kim on 09/29/2018 10:49:59 Leonetti, Arnold Carlson ([id number]) -------------------------------------------------------------------------------- Patient/Caregiver Education Details Patient Name: [name] Date of Service: 09/29/2018 10:45 AM Medical Record Number: [id number] Patient Account Number: [id number] Date of Birth/Gender: 1926-03-12 (85 y.o. F) Treating RN: [name] Primary Care Physician: [name] Other Clinician: Referring Physician: Thereasa Carlson Treating Physician/Extender: Sharalyn Ink in Treatment: 17 Education Assessment Education Provided To: Patient Education Topics Provided Wound/Skin Impairment: Handouts: Other: wound care as ordered Methods: Demonstration,  Explain/Verbal Responses: State content correctly Electronic Signature(s) Signed: 09/29/2018 5:01:18 PM By: [name] Entered By: [name] on 09/29/2018 11:41:46 Alban, Arnold Carlson ([id number]) -------------------------------------------------------------------------------- Wound Assessment Details Patient Name: [name] Date of Service: 09/29/2018 10:45 AM Medical Record Number: [id number] Patient Account Number: [id number] Date of Birth/Sex: [birth year] (85 y.o. 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 Wounding Event: Gradually Appeared Etiology: Date Acquired: 04/01/2018 Wound Open Weeks Of Treatment: 17 Status: Clustered Wound: No Comorbid Anemia, Arrhythmia, Congestive Heart Failure, Pending Amputation On Presentation History: Hypertension, Type II Diabetes, End Stage Renal Disease, Osteoarthritis Photos Wound Measurements Length: (cm) 3.8 % Reductio Width: (cm) 2.6 % Reductio Depth: (cm) 0.1 Epithelial Area: (cm) 7.76 Tunneling Volume: (cm) 0.776 Undermini n  in Area: -62.5% n in Volume: -62.3% ization: Small (1-33%) : No ng: No Wound Description Full Thickness With Exposed Support Foul Odor Classification: Structures Slough/Fi Wound Margin: Flat and Intact Exudate Medium Amount: Exudate Type: Serous Exudate Color: amber After Cleansing: No brino Yes Wound Bed Granulation Amount: Small (1-33%) Exposed Structure Necrotic Amount: Large (67-100%) Fascia Exposed: No Necrotic Quality: Adherent Slough Fat Layer (Subcutaneous Tissue) Exposed: Yes Tendon Exposed: Yes Muscle Exposed: No Joint Exposed: No Galiano, Christl JMarland Kitchen ([id number]) Bone  Exposed: No Electronic Signature(s) Signed: 10/02/2018 4:23:08 PM By: Gretta Cool, BSN, RN, CWS, Kim RN, BSN Entered By: Gretta Cool, BSN, RN, CWS, Kim on 09/29/2018 10:55:11 Werber, Arnold Carlson ([id number]) -------------------------------------------------------------------------------- Vitals Details Patient Name: [name] Date of Service: 09/29/2018 10:45 AM Medical Record Number: [id number] Patient Account Number: [id number] Date of Birth/Sex: Nov 19, 1925 (85 y.o. F) Treating RN: Cornell Barman Primary Care Jameal Razzano: [name] Other Clinician: Referring Tationa Stech: [name] Treating Irlanda Croghan/Extender: Melburn Hake, HOYT Weeks in Treatment: 17 Vital Signs Time Taken: 10:50 Temperature (F): 98.0 Height (in): 63 Pulse (bpm): 58 Weight (lbs): 102 Respiratory Rate (breaths/min): 16 Body Mass Index (BMI): 18.1 Blood Pressure (mmHg): 174/70 Reference Range: 80 - 120 mg / dl Electronic Signature(s) Signed: 10/02/2018 4:23:08 PM By: Gretta Cool, BSN, RN, CWS, Kim RN, BSN Entered By: Gretta Cool, BSN, RN, CWS, Kim on 09/29/2018 10:51:04

## 2018-10-05 ENCOUNTER — Encounter (INDEPENDENT_AMBULATORY_CARE_PROVIDER_SITE_OTHER): Payer: Medicare Other

## 2018-10-05 ENCOUNTER — Ambulatory Visit (INDEPENDENT_AMBULATORY_CARE_PROVIDER_SITE_OTHER): Payer: Medicare Other | Admitting: Vascular Surgery

## 2018-10-06 ENCOUNTER — Ambulatory Visit: Payer: Medicare Other | Admitting: Physician Assistant

## 2018-10-06 ENCOUNTER — Ambulatory Visit: Admit: 2018-10-06 | Payer: Medicare Other | Admitting: Podiatry

## 2018-10-06 SURGERY — APPLICATION, WOUND VAC
Anesthesia: Choice | Laterality: Right

## 2018-12-08 NOTE — Progress Notes (Signed)
ADDALINE, GOCH ([id number]) Visit Report for 07/14/2018 Arrival Information Details Patient Name: [name] Date of Service: 07/14/2018 9:45 AM Medical Record Number: [id number] Patient Account Number: [id number] Date of Birth/Sex: Mar 13, 1926 (85 y.o. F) Treating RN: Harold Barban Primary Care Lauramae Kneisley: Thereasa Distance Other Clinician: Referring Ericah Scotto: Thereasa Distance Treating Kendyll Huettner/Extender: Melburn Hake, HOYT Weeks in Treatment: 6 Visit Information History Since Last Visit Added or deleted any medications: No Patient Arrived: Wheel Chair Any new allergies or adverse reactions: No Arrival Time: 09:56 Had a fall or experienced change in No Accompanied By: family activities of daily living that may affect Transfer Assistance: None risk of falls: Patient Identification Verified: Yes Signs or symptoms of abuse/neglect since last visito No Secondary Verification Process Completed: Yes Hospitalized since last visit: No Patient Has Alerts: Yes Has Dressing in Place as Prescribed: Yes Patient Alerts: DMII Pain Present Now: No Electronic Signature(s) Signed: 12/08/2018 1:03:35 PM By: Harold Barban Entered By: Harold Barban on 07/14/2018 10:06:05 [name] ([id number]) -------------------------------------------------------------------------------- Clinic Level of Care Assessment Details Patient Name: [name] Date of Service: 07/14/2018 9:45 AM Medical Record Number: [id number] Patient Account Number: [id number] Date of Birth/Sex: April 07, 1926 (85 y.o. F) Treating RN: [name] Primary Care Jasiah Elsen: Thereasa Distance Other Clinician: Referring Morganna Styles: Thereasa Distance Treating Cassius Cullinane/Extender: Melburn Hake, HOYT Weeks in Treatment: 6 Clinic Level of Care Assessment Items TOOL 4 Quantity Score []  - Use when only an EandM is performed on FOLLOW-UP visit 0 ASSESSMENTS - Nursing Assessment / Reassessment X - Reassessment of Co-morbidities (includes updates in  patient status) 1 10 X- 1 5 Reassessment of Adherence to Treatment Plan ASSESSMENTS - Wound and Skin Assessment / Reassessment X - Simple Wound Assessment / Reassessment - one wound 1 5 []  - 0 Complex Wound Assessment / Reassessment - multiple wounds []  - 0 Dermatologic / Skin Assessment (not related to wound area) ASSESSMENTS - Focused Assessment X - Circumferential Edema Measurements - multi extremities 1 5 []  - 0 Nutritional Assessment / Counseling / Intervention X- 1 5 Lower Extremity Assessment (monofilament, tuning fork, pulses) []  - 0 Peripheral Arterial Disease Assessment (using hand held doppler) ASSESSMENTS - Ostomy and/or Continence Assessment and Care []  - Incontinence Assessment and Management 0 []  - 0 Ostomy Care Assessment and Management (repouching, etc.) PROCESS - Coordination of Care X - Simple Patient / Family Education for ongoing care 1 15 []  - 0 Complex (extensive) Patient / Family Education for ongoing care X- 1 10 Staff obtains Programmer, systems, Records, Test Results / Process Orders []  - 0 Staff telephones HHA, Nursing Homes / Clarify orders / etc []  - 0 Routine Transfer to another Facility (non-emergent condition) []  - 0 Routine Hospital Admission (non-emergent condition) []  - 0 New Admissions / Biomedical engineer / Ordering NPWT, Apligraf, etc. []  - 0 Emergency Hospital Admission (emergent condition) X- 1 10 Simple Discharge Coordination [name], [name] ([id number]) []  - 0 Complex (extensive) Discharge Coordination PROCESS - Special Needs []  - Pediatric / Minor Patient Management 0 []  - 0 Isolation Patient Management []  - 0 Hearing / Language / Visual special needs []  - 0 Assessment of Community assistance (transportation, D/C planning, etc.) []  - 0 Additional assistance / Altered mentation []  - 0 Support Surface(s) Assessment (bed, cushion, seat, etc.) INTERVENTIONS - Wound Cleansing / Measurement X - Simple Wound Cleansing - one wound 1  5 []  - 0 Complex Wound Cleansing - multiple wounds X- 1 5 Wound Imaging (photographs - any number of wounds) []  - 0 Wound  Tracing (instead of photographs) X- 1 5 Simple Wound Measurement - one wound []  - 0 Complex Wound Measurement - multiple wounds INTERVENTIONS - Wound Dressings X - Small Wound Dressing one or multiple wounds 1 10 []  - 0 Medium Wound Dressing one or multiple wounds []  - 0 Large Wound Dressing one or multiple wounds []  - 0 Application of Medications - topical []  - 0 Application of Medications - injection INTERVENTIONS - Miscellaneous []  - External ear exam 0 []  - 0 Specimen Collection (cultures, biopsies, blood, body fluids, etc.) []  - 0 Specimen(s) / Culture(s) sent or taken to Lab for analysis []  - 0 Patient Transfer (multiple staff / Civil Service fast streamer / Similar devices) []  - 0 Simple Staple / Suture removal (25 or less) []  - 0 Complex Staple / Suture removal (26 or more) []  - 0 Hypo / Hyperglycemic Management (close monitor of Blood Glucose) []  - 0 Ankle / Brachial Index (ABI) - do not check if billed separately X- 1 5 Vital Signs [name], [name] ([id number]) Has the patient been seen at the hospital within the last three years: Yes Total Score: 95 Level Of Care: New/Established - Level 3 Electronic Signature(s) Signed: 07/14/2018 3:08:04 PM By: [name] Entered By: [name] on 07/14/2018 11:11:43 [name] ([id number]) -------------------------------------------------------------------------------- Encounter Discharge Information Details Patient Name: [name] Date of Service: 07/14/2018 9:45 AM Medical Record Number: [id number] Patient Account Number: [id number] Date of Birth/Sex: [birth year] (85 y.o. F) Treating RN: Harold Barban Primary Care Wilho Sharpley: Thereasa Distance Other Clinician: Referring [name]: Thereasa Distance Treating [name]/Extender: Melburn Hake, HOYT Weeks in Treatment: 6 Encounter Discharge Information  Items Discharge Condition: Stable Ambulatory Status: Wheelchair Discharge Destination: Home Transportation: Private Auto Accompanied By: family Schedule Follow-up Appointment: Yes Clinical Summary of Care: Electronic Signature(s) Signed: 07/14/2018 1:53:27 PM By: Harold Barban Entered By: Harold Barban on 07/14/2018 13:53:27 Merry, Arnold Carlson ([id number]) -------------------------------------------------------------------------------- Lower Extremity Assessment Details Patient Name: [name] Date of Service: 07/14/2018 9:45 AM Medical Record Number: [id number] Patient Account Number: [id number] Date of Birth/Sex: [birth year] (85 y.o. F) Treating RN: Harold Barban Primary Care Dovber Ernest: Thereasa Distance Other Clinician: Referring Miesha Bachmann: Thereasa Distance Treating Elda Dunkerson/Extender: Melburn Hake, HOYT Weeks in Treatment: 6 Vascular Assessment Pulses: Dorsalis Pedis Palpable: [Right:Yes] Posterior Tibial Palpable: [Right:Yes] Extremity colors, hair growth, and conditions: Hair Growth on Extremity: [Right:No] Temperature of Extremity: [Right:Warm < 3 seconds] Toe Nail Assessment Left: Right: Thick: Yes Discolored: Yes Deformed: Yes Improper Length and Hygiene: No Electronic Signature(s) Signed: 12/08/2018 1:03:35 PM By: Harold Barban Entered By: Harold Barban on 07/14/2018 10:08:43 Dye, Arnold Carlson ([id number]) -------------------------------------------------------------------------------- Multi Wound Chart Details Patient Name: [name] Date of Service: 07/14/2018 9:45 AM Medical Record Number: [id number] Patient Account Number: [id number] Date of Birth/Sex: 11/30/1925 (85 y.o. F) Treating RN: [name] Primary Care Rohith Fauth: Thereasa Distance Other Clinician: Referring Tommy Minichiello: Thereasa Distance Treating Diara Chaudhari/Extender: Melburn Hake, HOYT Weeks in Treatment: 6 Vital Signs Height(in): 63 Pulse(bpm): 46 Weight(lbs): 102 Blood Pressure(mmHg): 137/62 Body  Mass Index(BMI): 18 Temperature(F): 97.6 Respiratory Rate 16 (breaths/min): Photos: [N/A:N/A] Wound Location: Right Achilles N/A N/A Wounding Event: Gradually Appeared N/A N/A Primary Etiology: Arterial Insufficiency Ulcer N/A N/A Secondary Etiology: Diabetic Wound/Ulcer of the N/A N/A Lower Extremity Comorbid History: Anemia, Arrhythmia, N/A N/A Congestive Heart Failure, Hypertension, Type II Diabetes, End Stage Renal Disease, Osteoarthritis Date Acquired: 04/01/2018 N/A N/A Weeks of Treatment: 6 N/A N/A Wound Status: Open N/A N/A Pending Amputation on Yes N/A N/A Presentation: Measurements L x W x D 4x2.6x0.3  N/A N/A (cm) Area (cm) : 8.168 N/A N/A Volume (cm) : 2.45 N/A N/A % Reduction in Area: -71.10% N/A N/A % Reduction in Volume: -412.60% N/A N/A Classification: Partial Thickness N/A N/A Exudate Amount: Medium N/A N/A Exudate Type: Serosanguineous N/A N/A Exudate Color: red, brown N/A N/A Wound Margin: Flat and Intact N/A N/A Granulation Amount: None Present (0%) N/A N/A Necrotic Amount: Large (67-100%) N/A N/A Necrotic Tissue: Eschar, Adherent Slough N/A N/A Groleau, Donalee J. ([id number]) Exposed Structures: Fat Layer (Subcutaneous N/A N/A Tissue) Exposed: Yes Fascia: No Tendon: No Muscle: No Joint: No Bone: No Epithelialization: None N/A N/A Erythema Location: Circumferential N/A N/A Wound Preparation: Ulcer Cleansing: N/A N/A Rinsed/Irrigated with Saline Topical Anesthetic Applied: Other: lidocaine 4% Treatment Notes Electronic Signature(s) Signed: 07/14/2018 3:08:04 PM By: [name] Entered By: [name] on 07/14/2018 10:52:40 Horsman, Arnold Carlson ([id number]) -------------------------------------------------------------------------------- Multi-Disciplinary Care Plan Details Patient Name: [name] Date of Service: 07/14/2018 9:45 AM Medical Record Number: [id number] Patient Account Number: [id number] Date of Birth/Sex: 1926-01-17 (85 y.o.  F) Treating RN: [name] Primary Care Timarion Agcaoili: Thereasa Distance Other Clinician: Referring Remington Highbaugh: Thereasa Distance Treating Brenly Trawick/Extender: Melburn Hake, HOYT Weeks in Treatment: 6 Active Inactive Abuse / Safety / Falls / Self Care Management Nursing Diagnoses: Potential for falls Goals: Patient will not experience any injury related to falls Date Initiated: 06/16/2018 Target Resolution Date: 09/16/2018 Goal Status: Active Interventions: Assess fall risk on admission and as needed Notes: Necrotic Tissue Nursing Diagnoses: Knowledge deficit related to management of necrotic/devitalized tissue Goals: Patient/caregiver will verbalize understanding of reason and process for debridement of necrotic tissue Date Initiated: 06/16/2018 Target Resolution Date: 09/16/2018 Goal Status: Active Interventions: Provide education on necrotic tissue and debridement process Notes: Nutrition Nursing Diagnoses: Imbalanced nutrition Goals: Patient/caregiver agrees to and verbalizes understanding of need to use nutritional supplements and/or vitamins as prescribed Date Initiated: 06/16/2018 Target Resolution Date: 09/16/2018 Goal Status: Active Interventions: Assess patient nutrition upon admission and as needed per policy ARANTXA, CARDIN ([id number]) Notes: Wound/Skin Impairment Nursing Diagnoses: Impaired tissue integrity Knowledge deficit related to ulceration/compromised skin integrity Goals: Ulcer/skin breakdown will have a volume reduction of 30% by week 4 Date Initiated: 06/02/2018 Target Resolution Date: 07/01/2018 Goal Status: Active Interventions: Assess patient/caregiver ability to obtain necessary supplies Assess patient/caregiver ability to perform ulcer/skin care regimen upon admission and as needed Assess ulceration(s) every visit Notes: Electronic Signature(s) Signed: 07/14/2018 3:08:04 PM By: [name] Entered By: [name] on 07/14/2018 10:51:50 Bruna, Arnold Carlson  ([id number]) -------------------------------------------------------------------------------- Pain Assessment Details Patient Name: [name] Date of Service: 07/14/2018 9:45 AM Medical Record Number: [id number] Patient Account Number: [id number] Date of Birth/Sex: 03-10-1926 (85 y.o. F) Treating RN: Harold Barban Primary Care Marguriete Wootan: Thereasa Distance Other Clinician: Referring Cortavius Montesinos: Thereasa Distance Treating Nailea Whitehorn/Extender: Melburn Hake, HOYT Weeks in Treatment: 6 Active Problems Location of Pain Severity and Description of Pain Patient Has Paino No Site Locations Pain Management and Medication Current Pain Management: Electronic Signature(s) Signed: 12/08/2018 1:03:35 PM By: Harold Barban Entered By: Harold Barban on 07/14/2018 10:06:12 Liverman, Arnold Carlson ([id number]) -------------------------------------------------------------------------------- Patient/Caregiver Education Details Patient Name: [name] Date of Service: 07/14/2018 9:45 AM Medical Record Number: [id number] Patient Account Number: [id number] Date of Birth/Gender: 03-07-1926 (85 y.o. F) Treating RN: [name] Primary Care Physician: Thereasa Distance Other Clinician: Referring Physician: Thereasa Distance Treating Physician/Extender: Sharalyn Ink in Treatment: 6 Education Assessment Education Provided To: Patient Education Topics Provided Wound/Skin Impairment: Handouts: Other: wound care as ordered Methods: Demonstration, Explain/Verbal Responses: State  content correctly Electronic Signature(s) Signed: 07/14/2018 3:08:04 PM By: [name] Entered By: [name] on 07/14/2018 11:12:27 Saylor, Arnold Carlson ([id number]) -------------------------------------------------------------------------------- Wound Assessment Details Patient Name: [name] Date of Service: 07/14/2018 9:45 AM Medical Record Number: [id number] Patient Account Number: [id number] Date of Birth/Sex: 10/30/1925  (85 y.o. 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 Wounding Event: Gradually Appeared Etiology: Date Acquired: 04/01/2018 Wound Open Weeks Of Treatment: 6 Status: Clustered Wound: No Comorbid Anemia, Arrhythmia, Congestive Heart Failure, Pending Amputation On Presentation History: Hypertension, Type II Diabetes, End Stage Renal Disease, Osteoarthritis Photos Wound Measurements Length: (cm) 4 % Reduction i Width: (cm) 2.6 % Reduction i Depth: (cm) 0.3 Epithelializa Area: (cm) 8.168 Tunneling: Volume: (cm) 2.45 Undermining: n Area: -71.1% n Volume: -412.6% tion: None No No Wound Description Classification: Partial Thickness Foul Odor Aft Wound Margin: Flat and Intact Slough/Fibrin Exudate Amount: Medium Exudate Type: Serosanguineous Exudate Color: red, brown er Cleansing: No o Yes Wound Bed Granulation Amount: None Present (0%) Exposed Structure Necrotic Amount: Large (67-100%) Fascia Exposed: No Necrotic Quality: Eschar, Adherent Slough Fat Layer (Subcutaneous Tissue) Exposed: Yes Tendon Exposed: No Muscle Exposed: No Joint Exposed: No Bone Exposed: No Chipman, Karyna J. ([id number]) Periwound Skin Texture Texture Color No Abnormalities Noted: No No Abnormalities Noted: No Callus: Yes Atrophie Blanche: No Crepitus: No Cyanosis: No Excoriation: No Ecchymosis: No Induration: No Erythema: Yes Rash: No Erythema Location: Circumferential Scarring: No Hemosiderin Staining: No Mottled: No Moisture Pallor: No No Abnormalities Noted: No Rubor: No Dry / Scaly: Yes Maceration: No Temperature / Pain Temperature: No Abnormality Tenderness on  Palpation: Yes Wound Preparation Ulcer Cleansing: Rinsed/Irrigated with Saline Topical Anesthetic Applied: Other: lidocaine 4%, Electronic Signature(s) Signed: 12/08/2018 1:03:35 PM By: Harold Barban Entered By: Harold Barban on 07/14/2018 10:07:54 Belt, Arnold Carlson ([id number]) -------------------------------------------------------------------------------- Vitals Details Patient Name: [name] Date of Service: 07/14/2018 9:45 AM Medical Record Number: [id number] Patient Account Number: [id number] Date of Birth/Sex: 01/12/1926 (85 y.o. F) Treating RN: Harold Barban Primary Care Abisai Coble: Thereasa Distance Other Clinician: Referring Caitrin Pendergraph: Thereasa Distance Treating Amaris Delafuente/Extender: Melburn Hake, HOYT Weeks in Treatment: 6 Vital Signs Time Taken: 10:00 Temperature (F): 97.6 Height (in): 63 Pulse (bpm): 63 Weight (lbs): 102 Respiratory Rate (breaths/min): 16 Body Mass Index (BMI): 18.1 Blood Pressure (mmHg): 137/62 Reference Range: 80 - 120 mg / dl Electronic Signature(s) Signed: 12/08/2018 1:03:35 PM By: Harold Barban Entered By: Harold Barban on 07/14/2018 10:06:34

## 2019-02-28 ENCOUNTER — Emergency Department: Payer: Medicare Other

## 2019-02-28 ENCOUNTER — Encounter: Payer: Self-pay | Admitting: Emergency Medicine

## 2019-02-28 ENCOUNTER — Other Ambulatory Visit: Payer: Self-pay

## 2019-02-28 ENCOUNTER — Emergency Department
Admission: EM | Admit: 2019-02-28 | Discharge: 2019-02-28 | Disposition: A | Discharge status: Home / Self Care | Payer: Medicare Other | Attending: Student in an Organized Health Care Education/Training Program | Admitting: Student in an Organized Health Care Education/Training Program

## 2019-02-28 DIAGNOSIS — E119 Type 2 diabetes mellitus without complications: Secondary | ICD-10-CM | POA: Insufficient documentation

## 2019-02-28 DIAGNOSIS — Z794 Long term (current) use of insulin: Secondary | ICD-10-CM | POA: Diagnosis not present

## 2019-02-28 DIAGNOSIS — Y929 Unspecified place or not applicable: Secondary | ICD-10-CM | POA: Diagnosis not present

## 2019-02-28 DIAGNOSIS — X58XXXA Exposure to other specified factors, initial encounter: Secondary | ICD-10-CM | POA: Diagnosis not present

## 2019-02-28 DIAGNOSIS — Z7902 Long term (current) use of antithrombotics/antiplatelets: Secondary | ICD-10-CM | POA: Diagnosis not present

## 2019-02-28 DIAGNOSIS — Y939 Activity, unspecified: Secondary | ICD-10-CM | POA: Diagnosis not present

## 2019-02-28 DIAGNOSIS — I509 Heart failure, unspecified: Secondary | ICD-10-CM | POA: Insufficient documentation

## 2019-02-28 DIAGNOSIS — I739 Peripheral vascular disease, unspecified: Secondary | ICD-10-CM | POA: Diagnosis not present

## 2019-02-28 DIAGNOSIS — S91301A Unspecified open wound, right foot, initial encounter: Secondary | ICD-10-CM | POA: Diagnosis not present

## 2019-02-28 DIAGNOSIS — I110 Hypertensive heart disease with heart failure: Secondary | ICD-10-CM | POA: Insufficient documentation

## 2019-02-28 DIAGNOSIS — Y999 Unspecified external cause status: Secondary | ICD-10-CM | POA: Diagnosis not present

## 2019-02-28 DIAGNOSIS — Z20828 Contact with and (suspected) exposure to other viral communicable diseases: Secondary | ICD-10-CM | POA: Insufficient documentation

## 2019-02-28 DIAGNOSIS — Z79899 Other long term (current) drug therapy: Secondary | ICD-10-CM | POA: Insufficient documentation

## 2019-02-28 DIAGNOSIS — M79661 Pain in right lower leg: Secondary | ICD-10-CM | POA: Diagnosis present

## 2019-02-28 LAB — CBC WITH DIFFERENTIAL/PLATELET
Abs Immature Granulocytes: 0.02 10*3/uL (ref 0.00–0.07)
Basophils Absolute: 0 10*3/uL (ref 0.0–0.1)
Basophils Relative: 0 %
Eosinophils Absolute: 0.2 10*3/uL (ref 0.0–0.5)
Eosinophils Relative: 3 %
HCT: 37 % (ref 36.0–46.0)
Hemoglobin: 12.2 g/dL (ref 12.0–15.0)
Immature Granulocytes: 0 %
Lymphocytes Relative: 21 %
Lymphs Abs: 1.4 10*3/uL (ref 0.7–4.0)
MCH: 31.6 pg (ref 26.0–34.0)
MCHC: 33 g/dL (ref 30.0–36.0)
MCV: 95.9 fL (ref 80.0–100.0)
Monocytes Absolute: 0.7 10*3/uL (ref 0.1–1.0)
Monocytes Relative: 11 %
Neutro Abs: 4.5 10*3/uL (ref 1.7–7.7)
Neutrophils Relative %: 65 %
Platelets: 236 10*3/uL (ref 150–400)
RBC: 3.86 MIL/uL — ABNORMAL LOW (ref 3.87–5.11)
RDW: 13.2 % (ref 11.5–15.5)
WBC: 6.9 10*3/uL (ref 4.0–10.5)
nRBC: 0 % (ref 0.0–0.2)

## 2019-02-28 LAB — COMPREHENSIVE METABOLIC PANEL
ALT: 9 U/L (ref 0–44)
AST: 21 U/L (ref 15–41)
Albumin: 3.9 g/dL (ref 3.5–5.0)
Alkaline Phosphatase: 69 U/L (ref 38–126)
Anion gap: 13 (ref 5–15)
BUN: 41 mg/dL — ABNORMAL HIGH (ref 8–23)
CO2: 23 mmol/L (ref 22–32)
Calcium: 9.4 mg/dL (ref 8.9–10.3)
Chloride: 104 mmol/L (ref 98–111)
Creatinine, Ser: 1.66 mg/dL — ABNORMAL HIGH (ref 0.44–1.00)
GFR calc Af Amer: 31 mL/min — ABNORMAL LOW (ref >60)
GFR calc non Af Amer: 27 mL/min — ABNORMAL LOW (ref >60)
Glucose, Bld: 162 mg/dL — ABNORMAL HIGH (ref 70–99)
Potassium: 4.1 mmol/L (ref 3.5–5.1)
Sodium: 140 mmol/L (ref 135–145)
Total Bilirubin: 1 mg/dL (ref 0.3–1.2)
Total Protein: 7.7 g/dL (ref 6.5–8.1)

## 2019-02-28 LAB — LACTIC ACID, PLASMA: Lactic Acid, Venous: 1.7 mmol/L (ref 0.5–1.9)

## 2019-02-28 LAB — C-REACTIVE PROTEIN: CRP: <0.8 mg/dL (ref <1.0)

## 2019-02-28 LAB — SEDIMENTATION RATE: Sed Rate: 29 mm/hr (ref 0–30)

## 2019-02-28 MED ORDER — SODIUM CHLORIDE 0.9 % IV BOLUS
250.0000 mL | Freq: Once | INTRAVENOUS | Status: AC
  Administered 2019-02-28: 250 mL via INTRAVENOUS

## 2019-02-28 MED ORDER — ACETAMINOPHEN 500 MG PO TABS
1000.0000 mg | ORAL_TABLET | Freq: Once | ORAL | Status: AC
  Administered 2019-02-28: 1000 mg via ORAL
  Filled 2019-02-28: qty 2

## 2019-02-28 NOTE — ED Notes (Signed)
E-signature did not work. Pt's son signed a hard copy and this RN witnessed the signature. Pt's signature was placed for medical records pickup

## 2019-02-28 NOTE — ED Triage Notes (Signed)
Pt reports is a diabetic and has been seeing Dr. Vickki Muff for a sore on the back of her right foot since February and a few days ago the area started to hurt worse. Pt reports called her MD and was told to just come to the ED. Pt with wound noted to back of her left, area wrapped.

## 2019-02-28 NOTE — ED Provider Notes (Signed)
Work-up is reassuring.  Patient has very faint monophasic Doppler signal of the right lower extremity.  Is not dusky and does have brisk cap refill that she does have some collateral flow but I do suspect some claudication.  Discussed case with Dr.  Ronalee Belts of vascular surgery.  Will be seen in clinic tomorrow for further evaluation and management.  Stable for outpatient follow up.   Merlyn Lot, MD 02/28/19 1925

## 2019-02-28 NOTE — ED Provider Notes (Signed)
[facility] Emergency Department Provider Note  ____________________________________________   First MD Initiated Contact with Patient 02/28/19 1412     (approximate)  I have reviewed the triage vital signs and the nursing notes.   HISTORY  Chief Complaint Wound Infection    HPI [name] is a 85 y.o. female  With h/o CHF, AFib, DM, here with wound infection. Pt has a known R heel wound. She has been receiving regular care at Dr. Kendrick Ranch for this, just seen on 11/16. She has completed 2 courses of ABX now, but reports increasing aching, throbbing, severe pain to the area over past 2-3 days. It had previously been painless. Denies any drainage, foul odor. Has been taking meds as rx'ed. Pain is worse at night, and with palpation/movement. NO alleviating factors.        Past Medical History:  Diagnosis Date  . A-fib ([place])   . CHF (congestive heart failure) ([place])   . Diabetes mellitus without complication ([place])   . Hypertension     Patient Active Problem List   Diagnosis Date Noted  . Abnormal SPEP 12/07/2017  . PAD (peripheral artery disease) ([place]) 07/19/2017  . Leg pain, lateral, left 07/19/2017  . Diabetes ([place]) 07/19/2017  . Hyperlipidemia 07/19/2017  . Essential hypertension 07/19/2017    Past Surgical History:  Procedure Laterality Date  . CHOLECYSTECTOMY    . LOWER EXTREMITY ANGIOGRAPHY Right 08/12/2017   Procedure: LOWER EXTREMITY ANGIOGRAPHY;  Surgeon: Katha Cabal, MD;  Location: [facility];  Service: Cardiovascular;  Laterality: Right;  . LOWER EXTREMITY ANGIOGRAPHY Right 06/13/2018   Procedure: LOWER EXTREMITY ANGIOGRAPHY;  Surgeon: Katha Cabal, MD;  Location: [facility];  Service: Cardiovascular;  Laterality: Right;    Prior to Admission medications   Medication Sig Start Date End Date Taking? Authorizing Provider  acetaminophen (TYLENOL) 325 MG tablet Take 325-650 mg by mouth every 6 (six) hours  as needed (for pain.).    [provider]  allopurinol (ZYLOPRIM) 100 MG tablet Take 100 mg by mouth daily. 03/23/17   [provider]  Calcium Carb-Cholecalciferol (CALCIUM 600+D3 PO) Take 1 tablet by mouth daily.    [provider]  carvedilol (COREG) 6.25 MG tablet Take 6.25 mg by mouth 2 (two) times daily. 05/15/18   [provider]  cholecalciferol (VITAMIN D) 25 MCG (1000 UT) tablet Take 1,000 Units by mouth daily.    [provider]  clopidogrel (PLAVIX) 75 MG tablet Take 1 tablet (75 mg total) by mouth daily. 06/14/18   Schnier, Dolores Lory, MD  diltiazem (CARDIZEM CD) 120 MG 24 hr capsule Take 120 mg by mouth daily.  05/10/17   [provider]  furosemide (LASIX) 20 MG tablet Take 40 mg by mouth daily.  03/07/17   [provider]  gabapentin (NEURONTIN) 100 MG capsule Take 100 mg by mouth daily as needed (pain).     [provider]  HUMALOG KWIKPEN 100 UNIT/ML KiwkPen Inject 8-10 Units into the skin 3 (three) times daily before meals. Per sliding scale 04/27/17   [provider]  LEVEMIR FLEXTOUCH 100 UNIT/ML Pen Inject 8 Units into the skin at bedtime.  06/15/17   [provider]  levothyroxine (SYNTHROID, LEVOTHROID) 88 MCG tablet Take 88 mcg by mouth daily before breakfast.  05/26/17   [provider]  lovastatin (MEVACOR) 20 MG tablet Take 20 mg by mouth daily with supper.  05/02/17   [provider]  meclizine (  ANTIVERT) 25 MG tablet Take 25 mg by mouth 3 (three) times daily as needed for dizziness.  04/20/17   [provider]  Melatonin 3 MG TABS Take 3 mg by mouth at bedtime as needed (sleep).    [provider]  metFORMIN (GLUCOPHAGE) 500 MG tablet Take 500 mg by mouth 2 (two) times daily with a meal.  05/26/17   [provider]  Multiple Vitamins-Minerals (PRESERVISION AREDS 2 PO) Take 1 tablet by mouth every evening.    [provider]  potassium  chloride SA (K-DUR,KLOR-CON) 20 MEQ tablet Take 20 mEq by mouth daily as needed (cramping).     [provider]    Allergies Sulfa antibiotics  Family History  Problem Relation Age of Onset  . Cancer Brother     Social History Social History   Tobacco Use  . Smoking status: Never Smoker  . Smokeless tobacco: Never Used  Substance Use Topics  . Alcohol use: No    Frequency: Never  . Drug use: No    Review of Systems  Review of Systems  Constitutional: Negative for fatigue and fever.  HENT: Negative for congestion and sore throat.   Eyes: Negative for visual disturbance.  Respiratory: Negative for cough and shortness of breath.   Cardiovascular: Negative for chest pain.  Gastrointestinal: Negative for abdominal pain, diarrhea, nausea and vomiting.  Genitourinary: Negative for flank pain.  Musculoskeletal: Positive for arthralgias and gait problem. Negative for back pain and neck pain.  Skin: Positive for rash and wound.  Neurological: Negative for weakness.     ____________________________________________  PHYSICAL EXAM:      VITAL SIGNS: ED Triage Vitals  Enc Vitals Group     BP 02/28/19 1050 (!) 161/54     Pulse Rate 02/28/19 1050 (!) 49     Resp 02/28/19 1050 14     Temp 02/28/19 1050 98.7 F (37.1 C)     Temp Source 02/28/19 1050 Oral     SpO2 02/28/19 1050 99 %     Weight 02/28/19 1051 115 lb (52.2 kg)     Height 02/28/19 1051 _0  (1.6 m)     Head Circumference --      Peak Flow --      Pain Score 02/28/19 1054 10     Pain Loc --      Pain Edu? --      Excl. in [place]? --      Physical Exam Vitals signs and nursing note reviewed.  Constitutional:      General: She is not in acute distress.    Appearance: She is well-developed.  HENT:     Head: Normocephalic and atraumatic.  Eyes:     Conjunctiva/sclera: Conjunctivae normal.  Neck:     Musculoskeletal: Neck supple.  Cardiovascular:     Rate and Rhythm: Normal rate and regular rhythm.      Heart sounds: Normal heart sounds.  Pulmonary:     Effort: Pulmonary effort is normal. No respiratory distress.     Breath sounds: No wheezing.  Abdominal:     General: There is no distension.  Skin:    General: Skin is warm.     Capillary Refill: Capillary refill takes less than 2 seconds.     Findings: No rash.  Neurological:     Mental Status: She is alert and oriented to person, place, and time.     Motor: No abnormal muscle tone.      LOWER EXTREMITY EXAM:  RIGHT  INSPECTION & PALPATION: Ulceration along posterior right heel and achilles tendon, with exposed adipose tissue. No foul odor. Minimal surrounding erythema. Granulation tissue noted in the wound bed. No bleeding.   SENSORY: sensation is intact to light touch in:  Superficial peroneal nerve distribution (over dorsum of foot) Deep peroneal nerve distribution (over first dorsal web space) Sural nerve distribution (over lateral aspect 5th metatarsal) Saphenous nerve distribution (over medial instep)  MOTOR:  + Motor EHL (great toe dorsiflexion) + FHL (great toe plantar flexion)  + TA (ankle dorsiflexion)  + GSC (ankle plantar flexion)  VASCULAR: Diffusely poor vascular flow, with barely palpable DP b/l  COMPARTMENTS: Soft, warm, well-perfused No pain with passive extension No parethesias    ____________________________________________   LABS (all labs ordered are listed, but only abnormal results are displayed)  Labs Reviewed  COMPREHENSIVE METABOLIC PANEL - Abnormal; Notable for the following components:      Result Value   Glucose, Bld 162 (*)    BUN 41 (*)    Creatinine, Ser 1.66 (*)    GFR calc non Af Amer 27 (*)    GFR calc Af Amer 31 (*)    All other components within normal limits  CBC WITH DIFFERENTIAL/PLATELET - Abnormal; Notable for the following components:   RBC 3.86 (*)    All other components within normal limits  SARS CORONAVIRUS 2 (TAT 6-24 HRS)  LACTIC ACID, PLASMA   SEDIMENTATION RATE  C-REACTIVE PROTEIN    ____________________________________________  ________________________________________  RADIOLOGY All imaging, including plain films, CT scans, and ultrasounds, independently reviewed by me, and interpretations confirmed via formal radiology reads.  ED MD interpretation:   XR: No osteo, no acute abnormalities  Official radiology report(s): Dg Ankle Complete Right  Result Date: 02/28/2019 CLINICAL DATA:  Increasing pain at the site of a posterior chronic soft tissue wound. EXAM: RIGHT ANKLE - COMPLETE 3+ VIEW COMPARISON:  06/02/2018 FINDINGS: There is no evidence of fracture, dislocation, or joint effusion. There is no evidence of osteomyelitis. No significant arthritic changes of the ankle. No discrete gas in the soft tissues. IMPRESSION: No evidence of osteomyelitis or other acute abnormalities. No visible gas in the soft tissues. Electronically Signed   By: Lorriane Shire M.D.   On: 02/28/2019 15:09   US Venous Img Lower Unilateral Right  Result Date: 02/28/2019 CLINICAL DATA:  Right lower extremity pain, edema and nonhealing right foot ulcer. History of peripheral vascular disease with prior right lower extremity arterial angioplasty and stenting. EXAM: RIGHT LOWER EXTREMITY VENOUS DOPPLER ULTRASOUND TECHNIQUE: Gray-scale sonography with graded compression, as well as color Doppler and duplex ultrasound were performed to evaluate the lower extremity deep venous systems from the level of the common femoral vein and including the common femoral, femoral, profunda femoral, popliteal and calf veins including the posterior tibial, peroneal and gastrocnemius veins when visible. The superficial great saphenous vein was also interrogated. Spectral Doppler was utilized to evaluate flow at rest and with distal augmentation maneuvers in the common femoral, femoral and popliteal veins. COMPARISON:  None. FINDINGS: Contralateral Common Femoral Vein: Respiratory  phasicity is normal and symmetric with the symptomatic side. No evidence of thrombus. Normal compressibility. Common Femoral Vein: No evidence of thrombus. Normal compressibility, respiratory phasicity and response to augmentation. Saphenofemoral Junction: No evidence of thrombus. Normal compressibility and flow on color Doppler imaging. Profunda Femoral Vein: No evidence of thrombus. Normal compressibility and flow on color Doppler imaging. Femoral Vein: No evidence of thrombus. Normal compressibility, respiratory phasicity and  response to augmentation. Popliteal Vein: No evidence of thrombus. Normal compressibility, respiratory phasicity and response to augmentation. Calf Veins: No evidence of thrombus. Normal compressibility and flow on color Doppler imaging. Superficial Great Saphenous Vein: No evidence of thrombus. Normal compressibility. Venous Reflux:  None. Other Findings: No evidence of superficial thrombophlebitis or abnormal fluid collection. Visible echogenic arterial stents in the right SFA and popliteal arteries. Arterial patency was not formally evaluated but it does appear that the SFA stents may be either occluded or nearly occluded with poor flow. IMPRESSION: 1. No evidence of right lower extremity deep venous thrombosis. 2. Stented segment of the right superficial femoral artery may be occluded or have very poor flow based on color Doppler images obtained with the adjacent vein. Electronically Signed   By: Aletta Edouard M.D.   On: 02/28/2019 16:38    ____________________________________________  PROCEDURES   Procedure(s) performed (including Critical Care):  Procedures  ____________________________________________  INITIAL IMPRESSION / MDM / [place] / ED COURSE  As part of my medical decision making, I reviewed the following data within the [facility] notes reviewed and incorporated, Old chart reviewed, Notes from prior ED visits, and [place]  Controlled Substance Database       *[name] was evaluated in Emergency Department on 02/28/2019 for the symptoms described in the history of present illness. She was evaluated in the context of the global COVID-19 pandemic, which necessitated consideration that the patient might be at risk for infection with the SARS-CoV-2 virus that causes COVID-19. Institutional protocols and algorithms that pertain to the evaluation of patients at risk for COVID-19 are in a state of rapid change based on information released by regulatory bodies including the CDC and federal and state organizations. These policies and algorithms were followed during the patient's care in the ED.  Some ED evaluations and interventions may be delayed as a result of limited staffing during the pandemic.*  Clinical Course as of Feb 27 1733  Wed Feb 28, 2019  1439 85 yo F here with R ankle pain and open wound, currently being followed by outpt Podiatry. On exam, pt does have moderate surrounding erythema, but no overt fluctuance. No fever. WBC normal. Inflammatory markers added on, will check XR and plan to eval ankle films for osteo/deep space infection.   [CI]    Clinical Course User Index [CI] Duffy Bruce, MD    Medical Decision Making:  85 yo F here with pain from R heel wound. Just seen by Podiatry on 11/16. On exam, she has some minimal erythema but wound appears otherwise similar to description on 11/16. Will re-dress. Labs show no leukocytosis, normal LA, normal ESR which is reassuring. XR without free air or deep infection. Plan to U/S. If pain improved, discussed with Dr. Cleda Mccreedy and would be reasonable to have her continue outpt abx and f/u in clinic in 2 days. Suspect there could be a component of chronic vascular claudication, and pt is being referred to Vascular.  ____________________________________________  FINAL CLINICAL IMPRESSION(S) / ED DIAGNOSES  Final diagnoses:  Non-healing wound of right heel   Peripheral vascular disease ([place])     MEDICATIONS GIVEN DURING THIS VISIT:  Medications  acetaminophen (TYLENOL) tablet 1,000 mg (1,000 mg Oral Given 02/28/19 1523)  sodium chloride 0.9 % bolus 250 mL (250 mLs Intravenous New Bag/Given 02/28/19 1523)     ED Discharge Orders    None       Note:  This document was  prepared using Systems analyst and may include unintentional dictation errors.   Duffy Bruce, MD 02/28/19 1734

## 2019-03-01 ENCOUNTER — Encounter
Admission: RE | Disposition: A | Discharge status: Home / Self Care | Payer: Self-pay | Source: Ambulatory Visit | Attending: Vascular Surgery

## 2019-03-01 ENCOUNTER — Observation Stay
Admission: RE | Admit: 2019-03-01 | Discharge: 2019-03-02 | Disposition: A | Discharge status: Home / Self Care | Payer: Medicare Other | Source: Ambulatory Visit | Attending: Vascular Surgery | Admitting: Vascular Surgery

## 2019-03-01 ENCOUNTER — Other Ambulatory Visit (INDEPENDENT_AMBULATORY_CARE_PROVIDER_SITE_OTHER): Payer: Self-pay | Admitting: Nurse Practitioner

## 2019-03-01 DIAGNOSIS — Z794 Long term (current) use of insulin: Secondary | ICD-10-CM | POA: Insufficient documentation

## 2019-03-01 DIAGNOSIS — Z882 Allergy status to sulfonamides status: Secondary | ICD-10-CM | POA: Diagnosis not present

## 2019-03-01 DIAGNOSIS — I70229 Atherosclerosis of native arteries of extremities with rest pain, unspecified extremity: Secondary | ICD-10-CM | POA: Diagnosis present

## 2019-03-01 DIAGNOSIS — L97419 Non-pressure chronic ulcer of right heel and midfoot with unspecified severity: Secondary | ICD-10-CM | POA: Insufficient documentation

## 2019-03-01 DIAGNOSIS — I4891 Unspecified atrial fibrillation: Secondary | ICD-10-CM | POA: Insufficient documentation

## 2019-03-01 DIAGNOSIS — I132 Hypertensive heart and chronic kidney disease with heart failure and with stage 5 chronic kidney disease, or end stage renal disease: Secondary | ICD-10-CM | POA: Insufficient documentation

## 2019-03-01 DIAGNOSIS — Z79899 Other long term (current) drug therapy: Secondary | ICD-10-CM | POA: Insufficient documentation

## 2019-03-01 DIAGNOSIS — E11621 Type 2 diabetes mellitus with foot ulcer: Secondary | ICD-10-CM | POA: Diagnosis not present

## 2019-03-01 DIAGNOSIS — M6281 Muscle weakness (generalized): Secondary | ICD-10-CM | POA: Diagnosis not present

## 2019-03-01 DIAGNOSIS — E1151 Type 2 diabetes mellitus with diabetic peripheral angiopathy without gangrene: Secondary | ICD-10-CM | POA: Insufficient documentation

## 2019-03-01 DIAGNOSIS — N185 Chronic kidney disease, stage 5: Secondary | ICD-10-CM | POA: Diagnosis not present

## 2019-03-01 DIAGNOSIS — I70221 Atherosclerosis of native arteries of extremities with rest pain, right leg: Principal | ICD-10-CM | POA: Insufficient documentation

## 2019-03-01 DIAGNOSIS — I739 Peripheral vascular disease, unspecified: Secondary | ICD-10-CM

## 2019-03-01 DIAGNOSIS — R2689 Other abnormalities of gait and mobility: Secondary | ICD-10-CM | POA: Insufficient documentation

## 2019-03-01 DIAGNOSIS — E1122 Type 2 diabetes mellitus with diabetic chronic kidney disease: Secondary | ICD-10-CM | POA: Diagnosis not present

## 2019-03-01 DIAGNOSIS — I70234 Atherosclerosis of native arteries of right leg with ulceration of heel and midfoot: Secondary | ICD-10-CM

## 2019-03-01 DIAGNOSIS — I7025 Atherosclerosis of native arteries of other extremities with ulceration: Secondary | ICD-10-CM | POA: Diagnosis present

## 2019-03-01 DIAGNOSIS — E782 Mixed hyperlipidemia: Secondary | ICD-10-CM | POA: Insufficient documentation

## 2019-03-01 DIAGNOSIS — Z79890 Hormone replacement therapy (postmenopausal): Secondary | ICD-10-CM | POA: Diagnosis not present

## 2019-03-01 HISTORY — PX: LOWER EXTREMITY ANGIOGRAPHY: CATH118251

## 2019-03-01 LAB — HEMOGLOBIN A1C
Hgb A1c MFr Bld: 6.2 % — ABNORMAL HIGH (ref 4.8–5.6)
Mean Plasma Glucose: 131.24 mg/dL

## 2019-03-01 LAB — SARS CORONAVIRUS 2 (TAT 6-24 HRS): SARS Coronavirus 2: NEGATIVE

## 2019-03-01 LAB — APTT: aPTT: 117 seconds — ABNORMAL HIGH (ref 24–36)

## 2019-03-01 LAB — GLUCOSE, CAPILLARY
Glucose-Capillary: 49 mg/dL — ABNORMAL LOW (ref 70–99)
Glucose-Capillary: 61 mg/dL — ABNORMAL LOW (ref 70–99)
Glucose-Capillary: 84 mg/dL (ref 70–99)
Glucose-Capillary: 99 mg/dL (ref 70–99)
Glucose-Capillary: 80 mg/dL (ref 70–99)
Glucose-Capillary: 101 mg/dL — ABNORMAL HIGH (ref 70–99)

## 2019-03-01 LAB — PROTIME-INR
INR: 1.1 (ref 0.8–1.2)
Prothrombin Time: 14.1 seconds (ref 11.4–15.2)

## 2019-03-01 SURGERY — LOWER EXTREMITY ANGIOGRAPHY
Anesthesia: Moderate Sedation | Site: Leg Lower | Laterality: Right

## 2019-03-01 MED ORDER — DEXTROSE 50 % IV SOLN
25.0000 mL | Freq: Once | INTRAVENOUS | Status: AC
  Administered 2019-03-01: 11:00:00 25 mL via INTRAVENOUS

## 2019-03-01 MED ORDER — MIDAZOLAM HCL 2 MG/2ML IJ SOLN
INTRAMUSCULAR | Status: DC | PRN
  Administered 2019-03-01 (×4): 0.5 mg via INTRAVENOUS

## 2019-03-01 MED ORDER — HEPARIN (PORCINE) 25000 UT/250ML-% IV SOLN
900.0000 [IU]/h | INTRAVENOUS | Status: DC
  Administered 2019-03-01: 900 [IU]/h via INTRAVENOUS

## 2019-03-01 MED ORDER — ALTEPLASE 2 MG IJ SOLR
INTRAMUSCULAR | Status: DC | PRN
  Administered 2019-03-01: 10 mg

## 2019-03-01 MED ORDER — MIDAZOLAM HCL 2 MG/2ML IJ SOLN
INTRAMUSCULAR | Status: AC
  Filled 2019-03-01: qty 2

## 2019-03-01 MED ORDER — HYDROMORPHONE HCL 1 MG/ML IJ SOLN
INTRAMUSCULAR | Status: AC
  Filled 2019-03-01: qty 1

## 2019-03-01 MED ORDER — INSULIN ASPART 100 UNIT/ML ~~LOC~~ SOLN
0.0000 [IU] | Freq: Three times a day (TID) | SUBCUTANEOUS | Status: DC
  Administered 2019-03-02: 2 [IU] via SUBCUTANEOUS
  Filled 2019-03-01: qty 1

## 2019-03-01 MED ORDER — CARVEDILOL 6.25 MG PO TABS
6.2500 mg | ORAL_TABLET | Freq: Two times a day (BID) | ORAL | Status: DC
  Administered 2019-03-01 – 2019-03-02 (×2): 6.25 mg via ORAL
  Filled 2019-03-01: qty 1
  Filled 2019-03-01 (×2): qty 2
  Filled 2019-03-01 (×2): qty 1

## 2019-03-01 MED ORDER — ONDANSETRON HCL 4 MG/2ML IJ SOLN: 4.0000 mg | Freq: Four times a day (QID) | INTRAMUSCULAR | Status: DC | PRN

## 2019-03-01 MED ORDER — MELATONIN 5 MG PO TABS
5.0000 mg | ORAL_TABLET | Freq: Every evening | ORAL | Status: DC | PRN
  Filled 2019-03-01: qty 1

## 2019-03-01 MED ORDER — FENTANYL CITRATE (PF) 100 MCG/2ML IJ SOLN
INTRAMUSCULAR | Status: AC
  Filled 2019-03-01: qty 2

## 2019-03-01 MED ORDER — ACETAMINOPHEN 325 MG PO TABS
650.0000 mg | ORAL_TABLET | ORAL | Status: DC | PRN
  Administered 2019-03-02 (×2): 650 mg via ORAL
  Filled 2019-03-01 (×2): qty 2

## 2019-03-01 MED ORDER — CLOPIDOGREL BISULFATE 75 MG PO TABS
75.0000 mg | ORAL_TABLET | Freq: Every day | ORAL | Status: DC
  Administered 2019-03-01 – 2019-03-02 (×2): 75 mg via ORAL
  Filled 2019-03-01 (×2): qty 1

## 2019-03-01 MED ORDER — INFLUENZA VAC A&B SA ADJ QUAD 0.5 ML IM PRSY
0.5000 mL | PREFILLED_SYRINGE | INTRAMUSCULAR | Status: DC
  Filled 2019-03-01: qty 0.5

## 2019-03-01 MED ORDER — FAMOTIDINE 20 MG PO TABS: 40.0000 mg | ORAL_TABLET | Freq: Once | ORAL | Status: DC | PRN

## 2019-03-01 MED ORDER — POTASSIUM CHLORIDE CRYS ER 20 MEQ PO TBCR: 20.0000 meq | EXTENDED_RELEASE_TABLET | Freq: Every day | ORAL | Status: DC | PRN

## 2019-03-01 MED ORDER — HYDROMORPHONE HCL 1 MG/ML IJ SOLN
INTRAMUSCULAR | Status: AC
  Filled 2019-03-01: qty 0.5

## 2019-03-01 MED ORDER — DIPHENHYDRAMINE HCL 50 MG/ML IJ SOLN: 50.0000 mg | Freq: Once | INTRAMUSCULAR | Status: DC | PRN

## 2019-03-01 MED ORDER — SODIUM CHLORIDE 0.9% FLUSH: 3.0000 mL | INTRAVENOUS | Status: DC | PRN

## 2019-03-01 MED ORDER — GABAPENTIN 100 MG PO CAPS: 100.0000 mg | ORAL_CAPSULE | Freq: Every day | ORAL | Status: DC | PRN

## 2019-03-01 MED ORDER — HEPARIN SODIUM (PORCINE) 1000 UNIT/ML IJ SOLN
INTRAMUSCULAR | Status: AC
  Filled 2019-03-01: qty 1

## 2019-03-01 MED ORDER — SODIUM CHLORIDE 0.9 % IV SOLN
INTRAVENOUS | Status: DC
  Administered 2019-03-01: 11:00:00 via INTRAVENOUS

## 2019-03-01 MED ORDER — MORPHINE SULFATE (PF) 4 MG/ML IV SOLN: 2.0000 mg | INTRAVENOUS | Status: DC | PRN

## 2019-03-01 MED ORDER — METHYLPREDNISOLONE SODIUM SUCC 125 MG IJ SOLR: 125.0000 mg | Freq: Once | INTRAMUSCULAR | Status: DC | PRN

## 2019-03-01 MED ORDER — IODIXANOL 320 MG/ML IV SOLN
INTRAVENOUS | Status: DC | PRN
  Administered 2019-03-01: 85 mL via INTRA_ARTERIAL

## 2019-03-01 MED ORDER — MECLIZINE HCL 25 MG PO TABS
25.0000 mg | ORAL_TABLET | Freq: Three times a day (TID) | ORAL | Status: DC | PRN
  Filled 2019-03-01: qty 1

## 2019-03-01 MED ORDER — DEXTROSE 50 % IV SOLN
INTRAVENOUS | Status: AC
  Administered 2019-03-01: 25 mL via INTRAVENOUS
  Filled 2019-03-01: qty 50

## 2019-03-01 MED ORDER — HYDRALAZINE HCL 20 MG/ML IJ SOLN
INTRAMUSCULAR | Status: AC
  Filled 2019-03-01: qty 1

## 2019-03-01 MED ORDER — HYDROMORPHONE HCL 1 MG/ML IJ SOLN
1.0000 mg | Freq: Once | INTRAMUSCULAR | Status: AC | PRN
  Administered 2019-03-01: 1 mg via INTRAVENOUS
  Administered 2019-03-01: 0.5 mg via INTRAVENOUS

## 2019-03-01 MED ORDER — ALLOPURINOL 100 MG PO TABS
100.0000 mg | ORAL_TABLET | Freq: Every day | ORAL | Status: DC
  Administered 2019-03-02: 09:00:00 100 mg via ORAL
  Filled 2019-03-01: qty 1

## 2019-03-01 MED ORDER — HEPARIN (PORCINE) 25000 UT/250ML-% IV SOLN
INTRAVENOUS | Status: AC
  Filled 2019-03-01: qty 250

## 2019-03-01 MED ORDER — HEPARIN SODIUM (PORCINE) 1000 UNIT/ML IJ SOLN
INTRAMUSCULAR | Status: DC | PRN
  Administered 2019-03-01: 1000 [IU] via INTRAVENOUS
  Administered 2019-03-01: 4000 [IU] via INTRAVENOUS

## 2019-03-01 MED ORDER — OXYCODONE HCL 5 MG PO TABS: 5.0000 mg | ORAL_TABLET | ORAL | Status: DC | PRN

## 2019-03-01 MED ORDER — HEPARIN BOLUS VIA INFUSION
1500.0000 [IU] | Freq: Once | INTRAVENOUS | Status: DC
  Filled 2019-03-01: qty 1500

## 2019-03-01 MED ORDER — ACETAMINOPHEN 325 MG PO TABS: 325.0000 mg | ORAL_TABLET | Freq: Four times a day (QID) | ORAL | Status: DC | PRN

## 2019-03-01 MED ORDER — LEVOTHYROXINE SODIUM 88 MCG PO TABS
88.0000 ug | ORAL_TABLET | Freq: Every day | ORAL | Status: DC
  Administered 2019-03-02: 88 ug via ORAL
  Filled 2019-03-01: qty 1

## 2019-03-01 MED ORDER — MIDAZOLAM HCL 2 MG/2ML IJ SOLN
INTRAMUSCULAR | Status: DC | PRN
  Administered 2019-03-01: .5 mg via INTRAVENOUS

## 2019-03-01 MED ORDER — CEFAZOLIN SODIUM-DEXTROSE 2-4 GM/100ML-% IV SOLN
2.0000 g | Freq: Once | INTRAVENOUS | Status: AC
  Administered 2019-03-01: 2 g via INTRAVENOUS

## 2019-03-01 MED ORDER — SODIUM CHLORIDE 0.9% FLUSH: 3.0000 mL | Freq: Two times a day (BID) | INTRAVENOUS | Status: DC

## 2019-03-01 MED ORDER — SODIUM CHLORIDE 0.9 % IV SOLN: INTRAVENOUS | Status: DC

## 2019-03-01 MED ORDER — FUROSEMIDE 40 MG PO TABS
40.0000 mg | ORAL_TABLET | Freq: Every day | ORAL | Status: DC
  Administered 2019-03-01 – 2019-03-02 (×2): 40 mg via ORAL
  Filled 2019-03-01 (×2): qty 1

## 2019-03-01 MED ORDER — MIDAZOLAM HCL 2 MG/ML PO SYRP: 8.0000 mg | ORAL_SOLUTION | Freq: Once | ORAL | Status: DC | PRN

## 2019-03-01 MED ORDER — FENTANYL CITRATE (PF) 100 MCG/2ML IJ SOLN
INTRAMUSCULAR | Status: DC | PRN
  Administered 2019-03-01 (×4): 25 ug via INTRAVENOUS
  Administered 2019-03-01: 25 ug

## 2019-03-01 MED ORDER — VITAMIN D 25 MCG (1000 UNIT) PO TABS
1000.0000 [IU] | ORAL_TABLET | Freq: Every day | ORAL | Status: DC
  Administered 2019-03-01 – 2019-03-02 (×2): 1000 [IU] via ORAL
  Filled 2019-03-01 (×2): qty 1

## 2019-03-01 MED ORDER — DILTIAZEM HCL ER COATED BEADS 120 MG PO CP24
120.0000 mg | ORAL_CAPSULE | Freq: Every day | ORAL | Status: DC
  Administered 2019-03-02: 120 mg via ORAL
  Filled 2019-03-01: qty 1

## 2019-03-01 MED ORDER — SODIUM CHLORIDE 0.9 % IV SOLN: 250.0000 mL | INTRAVENOUS | Status: DC | PRN

## 2019-03-01 SURGICAL SUPPLY — 30 items
BALLN DORADO 5X200X135 (BALLOONS) ×2
BALLN LUTONIX 018 4X100X130 (BALLOONS) ×2
BALLN LUTONIX 018 6X150X130 (BALLOONS) ×2
BALLN LUTONIX DCB 7X40X130 (BALLOONS) ×2
BALLN ULTRVRSE 3X100X150 (BALLOONS) ×2
BALLOON DORADO 5X200X135 (BALLOONS) ×1 IMPLANT
BALLOON LUTONIX 018 4X100X130 (BALLOONS) ×1 IMPLANT
BALLOON LUTONIX 018 6X150X130 (BALLOONS) ×1 IMPLANT
BALLOON LUTONIX DCB 7X40X130 (BALLOONS) ×1 IMPLANT
BALLOON ULTRVRSE 3X100X150 (BALLOONS) ×1 IMPLANT
CANISTER PENUMBRA ENGINE (MISCELLANEOUS) ×2 IMPLANT
CATH BEACON 5 .038 100 VERT TP (CATHETERS) ×2 IMPLANT
CATH INDIGO CAT6 KIT (CATHETERS) ×2 IMPLANT
CATH INFUS 135CMX50CM (CATHETERS) ×2 IMPLANT
CATH PIG 70CM (CATHETERS) ×2 IMPLANT
DEVICE PRESTO INFLATION (MISCELLANEOUS) ×2 IMPLANT
DEVICE STARCLOSE SE CLOSURE (Vascular Products) ×2 IMPLANT
GLIDEWIRE ADV .035X260CM (WIRE) ×2 IMPLANT
NEEDLE ENTRY 21GA 7CM ECHOTIP (NEEDLE) ×2 IMPLANT
PACK ANGIOGRAPHY (CUSTOM PROCEDURE TRAY) ×2 IMPLANT
SET INTRO CAPELLA COAXIAL (SET/KITS/TRAYS/PACK) ×2 IMPLANT
SHEATH BRITE TIP 5FRX11 (SHEATH) ×2 IMPLANT
SHEATH DESTIN RDC 6FR 45 (SHEATH) ×2 IMPLANT
STENT VIABAHN 6X250X120 (Permanent Stent) ×2 IMPLANT
STENT VIABAHN 6X25X120 (Permanent Stent) ×2 IMPLANT
SYR MEDRAD MARK 7 150ML (SYRINGE) ×2 IMPLANT
TOWEL OR 17X26 4PK STRL BLUE (TOWEL DISPOSABLE) ×2 IMPLANT
TUBING CONTRAST HIGH PRESS 72 (TUBING) ×2 IMPLANT
WIRE J 3MM .035X145CM (WIRE) ×2 IMPLANT
WIRE RUNTHROUGH .014X300CM (WIRE) ×2 IMPLANT

## 2019-03-01 NOTE — Progress Notes (Addendum)
CBG of 176. Taken by NT, Ebbie Latus. Results  failed to transfer over.

## 2019-03-01 NOTE — Consult Note (Signed)
[place] for heparin drip management Indication: VTE prophylaxis  Allergies  Allergen Reactions  . Sulfa Antibiotics Hives    Patient Measurements: Height: 5\' 3"  (160 cm) Weight: 115 lb (52.2 kg) IBW/kg (Calculated) : 52.4  Vital Signs: Temp: 97.9 F (36.6 C) (11/19 1112) Temp Source: Oral (11/19 1112) BP: 166/69 (11/19 1549) Pulse Rate: 59 (11/19 1515)  Labs: Recent Labs    02/28/19 1107  HGB 12.2  HCT 37.0  PLT 236  CREATININE 1.66*    Estimated Creatinine Clearance: 18.2 mL/min (A) (by C-G formula based on SCr of 1.66 mg/dL (H)).   Medical History: Past Medical History:  Diagnosis Date  . A-fib ([place])   . CHF (congestive heart failure) ([place])   . Diabetes mellitus without complication ([place])   . Hypertension    Assessment: 85 y.o. female  presented to the emergency room 11/18 with the complaint of increasing pain in her right lower extremity and known CAD.  She underwent angiography with intervention March 3 of 2020.  Physical exam and noninvasive studies are suggestive of reocclusion of the stented SFA and popliteal arteries. She is s/p right leg angiography. According to medical records she is on no chronic anticoagulation PTA. Baseline H&H, PLT wnl, other labs ordered but not yet resulted. Dr Delana Meyer has requested half of the loading dose and then per nomogram all to begin 1 hour after sheath removal which occurred at 1537  Goal of Therapy:  Heparin level 0.3-0.7 units/ml Monitor platelets by anticoagulation protocol: Yes   Plan:  Give 1500 units bolus x 1 Start heparin infusion at 900 units/hr Check anti-Xa level in 8 hours and daily while on heparin Continue to monitor H&H and platelets  Dallie Piles, PharmD 03/01/2019,3:59 PM

## 2019-03-01 NOTE — Op Note (Signed)
[facility] Percutaneous Study/Intervention Procedural Note   Date of Surgery: 03/01/2019  Surgeon: [name]  Pre-operative Diagnosis: Atherosclerotic occlusive disease bilateral lower extremities with ischemic rest pain of the right lower extremity associated with worsening right heel ulcer  Post-operative diagnosis: Same  Procedure(s) Performed: 1. Introduction catheter into right lower extremity 3rd order catheter placement  2. Contrast injection right lower extremity for distal runoff  3. Percutaneous transluminal angioplasty and stent placement right superficial femoral and popliteal arteries to 6 mm 4. Percutaneous transluminal angioplasty right anterior tibial to 4 mm with Lutonix drug-eluting balloon  5. Mechanical thrombectomy of the right SFA and popliteal using the penumbra CAT 6 device.             6.  Star close closure left common femoral arteriotomy  Anesthesia: Conscious sedation was administered under my direct supervision by the interventional radiology RN. IV Versed plus fentanyl were utilized. Continuous ECG, pulse oximetry and blood pressure was monitored throughout the entire procedure.  Conscious sedation was for a total of 3 hours.  Sheath: 6 [place] destination left common femoral retrograde  Contrast: 85 cc  Fluoroscopy Time: 17.8 minutes  Indications: [name] presents with increasing pain of the right lower extremity.  She also has a heel ulcer which had been improving but over the last 2 to 3 weeks has worsened significantly.  Noninvasive studies as well as physical examination suggesting worsening of her atherosclerotic occlusive disease.  She has had multiple interventions in the past and it is likely these have thrombosed.  This suggests the patient is having limb threatening ischemia. The risks and benefits are reviewed all questions answered patient  agrees to proceed with right lower extremity angiography and intervention for limb salvage.  Procedure:[name] is a 85 y.o. y.o. female who was identified and appropriate procedural time out was performed. The patient was then placed supine on the table and prepped and draped in the usual sterile fashion.   Ultrasound was placed in the sterile sleeve and the left groin was evaluated the left common femoral artery was echolucent and pulsatile indicating patency. Image was recorded for the permanent record and under real-time visualization a microneedle was inserted into the common femoral artery followed by the microwire and then the micro-sheath. A J-wire was then advanced through the micro-sheath and a 5 [place] sheath was then inserted over a J-wire. J-wire was then advanced and a 5 French pigtail catheter was positioned at the level of L3.  An LAO view of the pelvis was obtained. Subsequently a pigtail catheter with the stiff angle Glidewire was used to cross the aortic bifurcation the catheter wire were advanced down into the right distal external iliac artery. Oblique view of the femoral bifurcation was then obtained and subsequently the wire was reintroduced and the pigtail catheter negotiated into the SFA representing third order catheter placement. Distal runoff was then performed.  Diagnostic interpretation: The distal abdominal aorta is opacified with a bolus injection of contrast.  This is widely patent.  Given the patient's renal function I reviewed her most recent angiogram which demonstrated the aorta was widely patent.  The right common and external iliac artery is widely patent.  The right common femoral demonstrates a 60 to 70% stenosis in its midportion.  There is a 70% stenosis at the ostia of the right profunda.  The right SFA is thrombosed from its origin down through the popliteal.  This includes all of the previously placed stents.  The distal popliteal is patent but  demonstrates greater than 70% disease in its distal 1 to 2 cm.  The anterior tibial demonstrates greater than 70% stenosis at multiple locations from its origin down through the first one third.  The distal two thirds are widely patent and fill the pedal arch.  Tibioperoneal trunk demonstrates a string sign peroneal demonstrates diffuse disease and occludes prior to the ankle.  Posterior tibial is occluded throughout its entire course.  5000 units of heparin was then given and allowed to circulate and a 6 [place] destination sheath was advanced up and over the bifurcation and positioned in the femoral artery  An advantage wire with a Kumpe catheter is then engaged initiated down into the distal popliteal where hand-injection contrast confirms intraluminal placement.  An infusion catheter with a 50 cm infusion length is then advanced over the advantage wire.  10 mg of TPA is then reconstituted 10 cc and this is infused across the entire length of the popliteal and SFA.  This allowed to dwell for approximately 30 minutes.  The penumbra CAT 6 device is then opened onto the field and prepped.  A 0.014 run-through wire was advanced through the infusion catheter and the infusion catheter was removed.  The CAT 6 device is then advanced from the common femoral down to the distal posterior tibial.  2 complete passes are made.  Inspection of the canister demonstrates a fairly large amount of thrombus is retrieved.  Hand-injection of contrast now demonstrates flow although there is greater than 80% residual stenosis noted at the origin and several several several segments of the SFA and popliteal.  A 5 mm x 200 mm [place] balloon is then used to angioplasty the entire stented length of the SFA and popliteal.  Follow-up imaging demonstrates greater than 70% residual stenosis at the origin as well as a greater than 60% residual stenosis at the level of Hunter's canal.  A 6 mm x 2.5 cm Viabahn is then deployed across the  leading edge of the stent bringing the proximal edge of the Viabahn just to the ostia of the profunda femoris artery.  This was done in a steep RAO projection under magnified imaging.  It was then postdilated with a 7 mm x 40 mm Lutonix drug-eluting balloon extending across the mid common femoral lesion.  Inflation was to 12 atm for 1 minute.  Follow-up imaging now demonstrated wide patency of the common femoral preservation of flow into the profunda femoris and wide patency at the origin of the SFA.  Attention was then turned to the area of Hunter's canal where a 6 mm Lutonix drug-eluting balloon was utilized.  Inflation was to 10 atm for 1 minute polyp follow-up imaging demonstrated high-grade residual stenosis greater than 50% and therefore a 6 mm x 150 mm Viabahn stent was deployed beginning at the level of the tibial plateau and extending proximally.  Follow-up imaging demonstrated excellent apposition and I did not feel that the Viabahn needed to be postdilated.  There is now forward flow of contrast through the entire SFA and popliteal with less than 10% residual stenosis and attention was turned to the anterior tibial artery which is the dominant runoff to the foot.  With the 0.014 wire negotiated into the distal anterior tibial a 3 mm x 150 mm Ultraverse balloon was advanced across the proximal one third of the anterior tibial.  Inflation was to 12 atm for 1 minute.  Follow-up imaging demonstrated greater than 50% residual stenosis  particularly at the origin and a 4 mm x 100 mm Lutonix drug-eluting balloon was advanced across the segment.  Inflation was performed first at 4 atm and then 6 atm and then 8 atm at which point the ostial lesion yielded.  The balloon was left inflated for 1 minute.  Follow-up imaging now demonstrated wide patency of the distal popliteal as well as the anterior tibial tibial with preservation of distal flow.  After review of these images the sheath is pulled into the left  external iliac oblique of the common femoral is obtained and a Star close device deployed. There no immediate Complications.  Findings:  The distal abdominal aorta is opacified with a bolus injection of contrast.  This is widely patent.  Given the patient's renal function I reviewed her most recent angiogram which demonstrated the aorta was widely patent.  The right common and external iliac artery is widely patent.  The right common femoral demonstrates a 60 to 70% stenosis in its midportion.  There is a 70% stenosis at the ostia of the right profunda.  The right SFA is thrombosed from its origin down through the popliteal.  This includes all of the previously placed stents.  The distal popliteal is patent but demonstrates greater than 70% disease in its distal 1 to 2 cm.  The anterior tibial demonstrates greater than 70% stenosis at multiple locations from its origin down through the first one third.  The distal two thirds are widely patent and fill the pedal arch.  Tibioperoneal trunk demonstrates a string sign peroneal demonstrates diffuse disease and occludes prior to the ankle.  Posterior tibial is occluded throughout its entire course.  Following mechanical thrombectomy of the SFA and popliteal there is now patency.  Following angioplasty of the SFA and popliteal there is multiple areas of greater than 50% residual stenosis and therefore a Viabahn stent is deployed across the leading edge as described above and across the area of Hunter's canal.  Following stent placement there is less than 10% residual stenosis.  Following angioplasty of the anterior tibial to a maximum of 4 mm with Lutonix drug-eluting balloon there is now wide patency anterior tibial with preservation of distal runoff.  There is less than 10% residual stenosis.   Summary: Successful recanalization right lower extremity for limb salvage   Disposition: Patient was taken to the recovery room in stable  condition having tolerated the procedure well.  Belenda Cruise Schnier 03/01/2019,3:55 PM

## 2019-03-01 NOTE — H&P (Signed)
@LOGO @   MRN : [id number]  [name] is a 85 y.o. (November 04, 1925) female who presents with chief complaint of pain in the right lower extremity.  History of Present Illness:   The patient presented to the emergency room yesterday with the complaint of increasing pain in her right lower extremity.  She has known atherosclerotic occlusive disease of both lower extremities and has developed an ulceration of the right heel.  She underwent angiography with intervention March 3 of 2020 at which time crosser atherectomy for treatment of the right occluded SFA and popliteal was performed SFA and popliteal stents were placed and angioplasty was performed of the peroneal as well as the right anterior tibial.  The patient states approximately 2 to 3 weeks ago there was a significant deterioration in the lower extremity symptoms.    Her son notes that the wound had been healing quite nicely and looking better and better and then abruptly opened up.  The patient notes interval shortening of their claudication distance and development of severe rest pain symptoms. No new ulcers or wounds have occurred since the last visit but her previous ulcer is now worse.  There have been no significant changes to the patient's overall health care.  The patient denies amaurosis fugax or recent TIA symptoms. There are no recent neurological changes noted. The patient denies history of DVT, PE or superficial thrombophlebitis. The patient denies recent episodes of angina or shortness of breath.   Duplex ultrasound obtained in the emergency room of the right lower extremity venous system is also suggesting that the superficial femoral artery particularly the stented segment is likely occluded  Current Meds  Medication Sig  . acetaminophen (TYLENOL) 325 MG tablet Take 325-650 mg by mouth every 6 (six) hours as needed (for pain.).  Marland Kitchen allopurinol (ZYLOPRIM) 100 MG tablet Take 100 mg by mouth daily.  . Calcium  Carb-Cholecalciferol (CALCIUM 600+D3 PO) Take 1 tablet by mouth daily.  . carvedilol (COREG) 6.25 MG tablet Take 6.25 mg by mouth 2 (two) times daily.  . cholecalciferol (VITAMIN D) 25 MCG (1000 UT) tablet Take 1,000 Units by mouth daily.  . clopidogrel (PLAVIX) 75 MG tablet Take 1 tablet (75 mg total) by mouth daily.  Marland Kitchen diltiazem (CARDIZEM CD) 120 MG 24 hr capsule Take 120 mg by mouth daily.   . furosemide (LASIX) 20 MG tablet Take 40 mg by mouth daily.   Marland Kitchen gabapentin (NEURONTIN) 100 MG capsule Take 100 mg by mouth daily as needed (pain).   Marland Kitchen HUMALOG KWIKPEN 100 UNIT/ML KiwkPen Inject 8-10 Units into the skin 3 (three) times daily before meals. Per sliding scale  . LEVEMIR FLEXTOUCH 100 UNIT/ML Pen Inject 8 Units into the skin at bedtime.   Marland Kitchen levothyroxine (SYNTHROID, LEVOTHROID) 88 MCG tablet Take 88 mcg by mouth daily before breakfast.   . lovastatin (MEVACOR) 20 MG tablet Take 20 mg by mouth daily with supper.   . meclizine (ANTIVERT) 25 MG tablet Take 25 mg by mouth 3 (three) times daily as needed for dizziness.   . Melatonin 3 MG TABS Take 3 mg by mouth at bedtime as needed (sleep).  . metFORMIN (GLUCOPHAGE) 500 MG tablet Take 500 mg by mouth 2 (two) times daily with a meal.   . Multiple Vitamins-Minerals (PRESERVISION AREDS 2 PO) Take 1 tablet by mouth every evening.  . potassium chloride SA (K-DUR,KLOR-CON) 20 MEQ tablet Take 20 mEq by mouth daily as needed (cramping).     Past Medical History:  Diagnosis  Date  . A-fib [facility])   . CHF (congestive heart failure) ([place])   . Diabetes mellitus without complication ([place])   . Hypertension     Past Surgical History:  Procedure Laterality Date  . CHOLECYSTECTOMY    . LOWER EXTREMITY ANGIOGRAPHY Right 08/12/2017   Procedure: LOWER EXTREMITY ANGIOGRAPHY;  Surgeon: Katha Cabal, MD;  Location: [facility];  Service: Cardiovascular;  Laterality: Right;  . LOWER EXTREMITY ANGIOGRAPHY Right 06/13/2018   Procedure: LOWER EXTREMITY  ANGIOGRAPHY;  Surgeon: Katha Cabal, MD;  Location: [facility];  Service: Cardiovascular;  Laterality: Right;    Social History Social History   Tobacco Use  . Smoking status: Never Smoker  . Smokeless tobacco: Never Used  Substance Use Topics  . Alcohol use: No    Frequency: Never  . Drug use: No    Family History Family History  Problem Relation Age of Onset  . Cancer Brother     Allergies  Allergen Reactions  . Sulfa Antibiotics Hives     REVIEW OF SYSTEMS (Negative unless checked)  Constitutional: [] Weight loss  [] Fever  [] Chills Cardiac: [] Chest pain   [] Chest pressure   [] Palpitations   [] Shortness of breath when laying flat   [] Shortness of breath with exertion. Vascular:  [x] Pain in legs with walking   [x] Pain in legs at rest  [] History of DVT   [] Phlebitis   [] Swelling in legs   [] Varicose veins   [x] Non-healing ulcers Pulmonary:   [] Uses home oxygen   [] Productive cough   [] Hemoptysis   [] Wheeze  [] COPD   [] Asthma Neurologic:  [] Dizziness   [] Seizures   [] History of stroke   [] History of TIA  [] Aphasia   [] Vissual changes   [] Weakness or numbness in arm   [] Weakness or numbness in leg Musculoskeletal:   [] Joint swelling   [] Joint pain   [] Low back pain Hematologic:  [] Easy bruising  [] Easy bleeding   [] Hypercoagulable state   [] Anemic Gastrointestinal:  [] Diarrhea   [] Vomiting  [] Gastroesophageal reflux/heartburn   [] Difficulty swallowing. Genitourinary:  [x] Chronic kidney disease   [] Difficult urination  [] Frequent urination   [] Blood in urine Skin:  [] Rashes   [] Ulcers  Psychological:  [] History of anxiety   []  History of major depression.  Physical Examination  Vitals:   03/01/19 1112  BP: (!) 216/73  Pulse: 63  Resp: 19  Temp: 97.9 F (36.6 C)  TempSrc: Oral  SpO2: 99%  Weight: 52.2 kg  Height: 5\' 3"  (1.6 m)   Body mass index is 20.37 kg/m. Gen: WD/WN, NAD Head: [place]/AT, No temporalis wasting.  Ear/Nose/Throat: Hearing grossly  intact, nares w/o erythema or drainage Eyes: PER, EOMI, sclera nonicteric.  Neck: Supple, no large masses.   Pulmonary:  Good air movement, no audible wheezing bilaterally, no use of accessory muscles.  Cardiac: RRR, no JVD Vascular: Right foot ulcer pale poorly granulated but noninfected Vessel Right Left  Radial Palpable Palpable  PT Not Palpable Not Palpable  DP Not Palpable Not Palpable  Gastrointestinal: Non-distended. No guarding/no peritoneal signs.  Musculoskeletal: M/S 5/5 throughout.  No deformity or atrophy.  Neurologic: CN 2-12 intact. Symmetrical.  Speech is fluent. Motor exam as listed above. Psychiatric: Judgment intact, Mood & affect appropriate for pt's clinical situation. Dermatologic: No rashes + right heel ulcer noted.  No changes consistent with cellulitis. Lymph : No lichenification or skin changes of chronic lymphedema.  CBC Lab Results  Component Value Date   WBC 6.9 02/28/2019   HGB 12.2 02/28/2019  HCT 37.0 02/28/2019   MCV 95.9 02/28/2019   PLT 236 02/28/2019    BMET    Component Value Date/Time   NA 140 02/28/2019 1107   NA 141 08/03/2011 0729   K 4.1 02/28/2019 1107   K 3.6 08/03/2011 0729   CL 104 02/28/2019 1107   CL 105 08/03/2011 0729   CO2 23 02/28/2019 1107   CO2 26 08/03/2011 0729   GLUCOSE 162 (H) 02/28/2019 1107   GLUCOSE 149 (H) 08/03/2011 0729   BUN 41 (H) 02/28/2019 1107   BUN 16 08/03/2011 0729   CREATININE 1.66 (H) 02/28/2019 1107   CREATININE 1.07 08/03/2011 0729   CALCIUM 9.4 02/28/2019 1107   CALCIUM 9.5 08/03/2011 0729   GFRNONAA 27 (L) 02/28/2019 1107   GFRNONAA 48 (L) 08/03/2011 0729   GFRAA 31 (L) 02/28/2019 1107   GFRAA 56 (L) 08/03/2011 0729   Estimated Creatinine Clearance: 18.2 mL/min (A) (by C-G formula based on SCr of 1.66 mg/dL (H)).  COAG Lab Results  Component Value Date   INR 1.13 08/12/2017   INR 2.24 05/30/2017    Radiology Dg Ankle Complete Right  Result Date: 02/28/2019 CLINICAL DATA:   Increasing pain at the site of a posterior chronic soft tissue wound. EXAM: RIGHT ANKLE - COMPLETE 3+ VIEW COMPARISON:  06/02/2018 FINDINGS: There is no evidence of fracture, dislocation, or joint effusion. There is no evidence of osteomyelitis. No significant arthritic changes of the ankle. No discrete gas in the soft tissues. IMPRESSION: No evidence of osteomyelitis or other acute abnormalities. No visible gas in the soft tissues. Electronically Signed   By: Lorriane Shire M.D.   On: 02/28/2019 15:09   US Venous Img Lower Unilateral Right  Result Date: 02/28/2019 CLINICAL DATA:  Right lower extremity pain, edema and nonhealing right foot ulcer. History of peripheral vascular disease with prior right lower extremity arterial angioplasty and stenting. EXAM: RIGHT LOWER EXTREMITY VENOUS DOPPLER ULTRASOUND TECHNIQUE: Gray-scale sonography with graded compression, as well as color Doppler and duplex ultrasound were performed to evaluate the lower extremity deep venous systems from the level of the common femoral vein and including the common femoral, femoral, profunda femoral, popliteal and calf veins including the posterior tibial, peroneal and gastrocnemius veins when visible. The superficial great saphenous vein was also interrogated. Spectral Doppler was utilized to evaluate flow at rest and with distal augmentation maneuvers in the common femoral, femoral and popliteal veins. COMPARISON:  None. FINDINGS: Contralateral Common Femoral Vein: Respiratory phasicity is normal and symmetric with the symptomatic side. No evidence of thrombus. Normal compressibility. Common Femoral Vein: No evidence of thrombus. Normal compressibility, respiratory phasicity and response to augmentation. Saphenofemoral Junction: No evidence of thrombus. Normal compressibility and flow on color Doppler imaging. Profunda Femoral Vein: No evidence of thrombus. Normal compressibility and flow on color Doppler imaging. Femoral Vein: No  evidence of thrombus. Normal compressibility, respiratory phasicity and response to augmentation. Popliteal Vein: No evidence of thrombus. Normal compressibility, respiratory phasicity and response to augmentation. Calf Veins: No evidence of thrombus. Normal compressibility and flow on color Doppler imaging. Superficial Great Saphenous Vein: No evidence of thrombus. Normal compressibility. Venous Reflux:  None. Other Findings: No evidence of superficial thrombophlebitis or abnormal fluid collection. Visible echogenic arterial stents in the right SFA and popliteal arteries. Arterial patency was not formally evaluated but it does appear that the SFA stents may be either occluded or nearly occluded with poor flow. IMPRESSION: 1. No evidence of right lower extremity deep venous thrombosis. 2. Stented  segment of the right superficial femoral artery may be occluded or have very poor flow based on color Doppler images obtained with the adjacent vein. Electronically Signed   By: Aletta Edouard M.D.   On: 02/28/2019 16:38     Assessment/Plan 1. PAD (peripheral artery disease) (HCC)  Recommend:  The patient has evidence of severe atherosclerotic changes of both lower extremities associated with ulceration and tissue loss of the foot.  Several weeks ago her symptoms were worsened significantly and her wound has also worsened.  Physical exam and noninvasive studies are suggestive of reocclusion of the stented SFA and popliteal arteries.  This represents a limb threatening ischemia and places the patient at the risk for limb loss.  Patient should undergo angiography of the right lower extremities with the hope for intervention for limb salvage.  The risks and benefits as well as the alternative therapies was discussed in detail with the patient.  All questions were answered.  Patient agrees to proceed with right leg angiography.  Patient has severe renal insufficiency and this will certainly complicate the  procedure.  I have made both her and her son aware.  We will proceed as her threat of limb loss is very high.  We will be as judicious as possible in the use of contrast.  The patient will follow up with me in the office after the procedure.   2. Mixed hyperlipidemia Continue statin as ordered and reviewed, no changes at this time   3. Essential hypertension Continue antihypertensive medications as already ordered, these medications have been reviewed and there are no changes at this time.   4. Type 2 diabetes mellitus with diabetic peripheral angiopathy without gangrene, with long-term current use of insulin ([place]) Continue hypoglycemic medications as already ordered, these medications have been reviewed and there are no changes at this time.  Hgb A1C to be monitored as already arranged by primary service  5.  Chronic renal insufficiency stage V not on hemodialysis: Patient will be gently hydrated as she does have a history of congestive heart failure.  We will minimize contrast as much as possible.  We will avoid other nephrotoxic drugs.     [name], MD  03/01/2019 1:18 PM

## 2019-03-01 NOTE — Progress Notes (Signed)
Left groin site with saturated 2x2 guaze dressing, removed dressing and noted small trickle of brb coming from access site.  Site cleaned and notified dr. Delana Meyer. Order for PAD received and PAD applied to site.  Site is now bleeding at the site under the PAD.  Len Blalock, RT at bedside injecting epi subq to site PAD reapplied with 37ml air

## 2019-03-02 ENCOUNTER — Encounter: Payer: Self-pay | Admitting: Vascular Surgery

## 2019-03-02 DIAGNOSIS — I70221 Atherosclerosis of native arteries of extremities with rest pain, right leg: Secondary | ICD-10-CM | POA: Diagnosis not present

## 2019-03-02 DIAGNOSIS — I70234 Atherosclerosis of native arteries of right leg with ulceration of heel and midfoot: Secondary | ICD-10-CM

## 2019-03-02 LAB — CBC
HCT: 35.5 % — ABNORMAL LOW (ref 36.0–46.0)
Hemoglobin: 11.7 g/dL — ABNORMAL LOW (ref 12.0–15.0)
MCH: 31.7 pg (ref 26.0–34.0)
MCHC: 33 g/dL (ref 30.0–36.0)
MCV: 96.2 fL (ref 80.0–100.0)
Platelets: 210 10*3/uL (ref 150–400)
RBC: 3.69 MIL/uL — ABNORMAL LOW (ref 3.87–5.11)
RDW: 13.2 % (ref 11.5–15.5)
WBC: 8.8 10*3/uL (ref 4.0–10.5)
nRBC: 0 % (ref 0.0–0.2)

## 2019-03-02 LAB — GLUCOSE, CAPILLARY
Glucose-Capillary: 176 mg/dL — ABNORMAL HIGH (ref 70–99)
Glucose-Capillary: 131 mg/dL — ABNORMAL HIGH (ref 70–99)
Glucose-Capillary: 104 mg/dL — ABNORMAL HIGH (ref 70–99)
Glucose-Capillary: 184 mg/dL — ABNORMAL HIGH (ref 70–99)

## 2019-03-02 LAB — HEPARIN LEVEL (UNFRACTIONATED)
Heparin Unfractionated: 1.03 IU/mL — ABNORMAL HIGH (ref 0.30–0.70)
Heparin Unfractionated: 0.24 IU/mL — ABNORMAL LOW (ref 0.30–0.70)

## 2019-03-02 MED ORDER — CLOPIDOGREL BISULFATE 75 MG PO TABS: 75.0000 mg | ORAL_TABLET | Freq: Every day | ORAL | 11 refills | Status: DC

## 2019-03-02 MED ORDER — HEPARIN (PORCINE) 25000 UT/250ML-% IV SOLN
700.0000 [IU]/h | INTRAVENOUS | Status: DC
  Administered 2019-03-02: 600 [IU]/h via INTRAVENOUS

## 2019-03-02 NOTE — Progress Notes (Signed)
Discharge instructions given and went over with patient and patients son at bedside. All questions answered. Patient discharged home. Madlyn Frankel, RN

## 2019-03-02 NOTE — Consult Note (Signed)
[place] for heparin drip management Indication: VTE prophylaxis  Allergies  Allergen Reactions  . Sulfa Antibiotics Hives    Patient Measurements: Height: 5\' 2"  (157.5 cm) Weight: 114 lb 6.7 oz (51.9 kg) IBW/kg (Calculated) : 50.1  Vital Signs: BP: 121/67 (11/20 0800) Pulse Rate: 54 (11/20 0800)  Labs: Recent Labs    02/28/19 1107 03/01/19 1841 03/02/19 0219 03/02/19 1256  HGB 12.2  --  11.7*  --   HCT 37.0  --  35.5*  --   PLT 236  --  210  --   APTT  --  117*  --   --   LABPROT  --  14.1  --   --   INR  --  1.1  --   --   HEPARINUNFRC  --   --  1.03* 0.24*  CREATININE 1.66*  --   --   --     Estimated Creatinine Clearance: 17.5 mL/min (A) (by C-G formula based on SCr of 1.66 mg/dL (H)).   Medical History: Past Medical History:  Diagnosis Date  . A-fib ([place])   . CHF (congestive heart failure) ([place])   . Diabetes mellitus without complication ([place])   . Hypertension    Assessment: 85 y.o. female  presented to the emergency room 11/18 with the complaint of increasing pain in her right lower extremity and known CAD.  She underwent angiography with intervention March 3 of 2020.  Physical exam and noninvasive studies are suggestive of reocclusion of the stented SFA and popliteal arteries. She is s/p right leg angiography. According to medical records she is on no chronic anticoagulation PTA. Baseline H&H, PLT wnl, other labs ordered but not yet resulted. Dr Delana Meyer has requested half of the loading dose and then per nomogram all to begin 1 hour after sheath removal which occurred at 1537  11/20 @ 0250 HL 1.03 supratherapeutic and per RN patient is bleeding from insertion site, RN said vascular would like to stop heparin 2 hours. Will restart rate in 2 hours at a rate of 600 units/hr   Goal of Therapy:  Heparin level 0.3-0.7 units/ml Monitor platelets by anticoagulation protocol: Yes   Plan:  11/20@1256 : HL 0.24. Will not bolus  due to bleeding at site, and will increase the rate to 700 units/hr.  Will recheck HL in 6 hours and continue to monitor platelets/Hgb with AM labs.  Pearla Dubonnet, PharmD 03/02/2019,1:39 PM

## 2019-03-02 NOTE — TOC Initial Note (Signed)
Transition of Care [facility]) - Initial/Assessment Note    Patient Details  Name: [name] MRN: [id number] Date of Birth: 01-Nov-1925  Transition of Care [facility]) CM/SW Contact:    Shelbie Hutching, RN Phone Number: 03/02/2019, 3:34 PM  Clinical Narrative:                 Patient is from home where she lives with her husband, patient's 4 children alternate staying the night.  Patient's husband is under hospice care.  Patient reports that she walks with a walker all the time.  Patient is currently under observation for atherosclerosis of lower extremity with wound on the right foot.  Patient underwent angiogram yesterday.  When medically stable patient will discharge with home health services.  Patient has had home health in the past she thinks with [facility].     1706: Update- Patient was actually open with [facility] in the past as recent as August of this year.  Sharmon Revere with [facility] has accepted the referral for home health.    Expected Discharge Plan: [place] Barriers to Discharge: Continued Medical Work up   Patient Goals and CMS Choice Patient states their goals for this hospitalization and ongoing recovery are:: to go home CMS Medicare.gov Compare Post Acute Care list provided to:: Patient Choice offered to / list presented to : Patient  Expected Discharge Plan and Services Expected Discharge Plan: [place]   Discharge Planning Services: CM Consult Post Acute Care Choice: [place] arrangements for the past 2 months: Single Family Home                           HH Arranged: RN, PT, Nurse's Aide   Date HH Agency Contacted: 03/02/19 Time [place]: [id number]    Prior Living Arrangements/Services Living arrangements for the past 2 months: [place] Lives with:: Spouse, Adult Children Patient language and need for interpreter reviewed:: Yes Do you feel safe going back to the place where you live?: Yes      Need  for Family Participation in Patient Care: Yes (Comment) Care giver support system in place?: Yes (comment)(son and daughter's) Current home services: DME(rolling walker) Criminal Activity/Legal Involvement Pertinent to Current Situation/Hospitalization: No - Comment as needed  Activities of Daily Living Home Assistive Devices/Equipment: Walker (specify type), CBG Meter, Shower chair with back, Grab bars around toilet ADL Screening (condition at time of admission) Patient's cognitive ability adequate to safely complete daily activities?: Yes Is the patient deaf or have difficulty hearing?: Yes Does the patient have difficulty seeing, even when wearing glasses/contacts?: No Does the patient have difficulty concentrating, remembering, or making decisions?: No Patient able to express need for assistance with ADLs?: Yes Does the patient have difficulty dressing or bathing?: Yes Independently performs ADLs?: No Communication: Independent Dressing (OT): Independent Grooming: Independent Feeding: Independent Bathing: Independent with device (comment) Toileting: Independent with device (comment) In/Out Bed: Independent with device (comment) Walks in Home: Independent with device (comment) Does the patient have difficulty walking or climbing stairs?: Yes Weakness of Legs: Both Weakness of Arms/Hands: Both  Permission Sought/Granted Permission sought to share information with : Case Manager, Family Supports Permission granted to share information with : Yes, Verbal Permission Granted     Permission granted to share info w AGENCY: [place] granted to share info w Relationship: Son- Johnny     Emotional Assessment Appearance:: Appears stated  age Attitude/Demeanor/Rapport: Engaged Affect (typically observed): Accepting Orientation: : Oriented to Self, Oriented to Place, Oriented to  Time, Oriented to Situation Alcohol / Substance Use: Not Applicable Psych Involvement:  No (comment)  Admission diagnosis:  Atherosclerosis of artery of extremity with rest pain [facility]) [I70.229] Patient Active Problem List   Diagnosis Date Noted  . Atherosclerosis of artery of extremity with rest pain ([place]) 03/01/2019  . Abnormal SPEP 12/07/2017  . PAD (peripheral artery disease) ([place]) 07/19/2017  . Leg pain, lateral, left 07/19/2017  . Diabetes ([place]) 07/19/2017  . Hyperlipidemia 07/19/2017  . Essential hypertension 07/19/2017   PCP:  Sofie Hartigan, MD Pharmacy:   [facility] [place] [address] Phone: [phone] Fax: [phone]     Social Determinants of Health (SDOH) Interventions    Readmission Risk Interventions No flowsheet data found.

## 2019-03-02 NOTE — Progress Notes (Signed)
Pt VS remain stable. Applied dressing remains clean dry and intact. No apparent bleeding. Heparin drip restarted at rate of 6 ml/hr; 600 units per hr.

## 2019-03-02 NOTE — Consult Note (Signed)
[place] for heparin drip management Indication: VTE prophylaxis  Allergies  Allergen Reactions  . Sulfa Antibiotics Hives    Patient Measurements: Height: 5\' 2"  (157.5 cm) Weight: 114 lb 6.7 oz (51.9 kg) IBW/kg (Calculated) : 50.1  Vital Signs: Temp: 97.9 F (36.6 C) (11/19 2301) Temp Source: Oral (11/19 2301) BP: 136/90 (11/19 2301) Pulse Rate: 76 (11/19 2301)  Labs: Recent Labs    02/28/19 1107 03/01/19 1841 03/02/19 0219  HGB 12.2  --  11.7*  HCT 37.0  --  35.5*  PLT 236  --  210  APTT  --  117*  --   LABPROT  --  14.1  --   INR  --  1.1  --   HEPARINUNFRC  --   --  1.03*  CREATININE 1.66*  --   --     Estimated Creatinine Clearance: 17.5 mL/min (A) (by C-G formula based on SCr of 1.66 mg/dL (H)).   Medical History: Past Medical History:  Diagnosis Date  . A-fib ([place])   . CHF (congestive heart failure) ([place])   . Diabetes mellitus without complication ([place])   . Hypertension    Assessment: 85 y.o. female  presented to the emergency room 11/18 with the complaint of increasing pain in her right lower extremity and known CAD.  She underwent angiography with intervention March 3 of 2020.  Physical exam and noninvasive studies are suggestive of reocclusion of the stented SFA and popliteal arteries. She is s/p right leg angiography. According to medical records she is on no chronic anticoagulation PTA. Baseline H&H, PLT wnl, other labs ordered but not yet resulted. Dr Delana Meyer has requested half of the loading dose and then per nomogram all to begin 1 hour after sheath removal which occurred at 1537  Goal of Therapy:  Heparin level 0.3-0.7 units/ml Monitor platelets by anticoagulation protocol: Yes   Plan:  11/20 @ 0250 HL 1.03 supratherapeutic and per RN patient is bleeding from insertion site, RN said vascular would like to stop heparin 2 hours. Will restart rate in 2 hours at a rate of 600 units/hr and will recheck HL @  1300, CBC has trended down a bit, will continue to monitor.  Tobie Lords, PharmD 03/02/2019,2:51 AM

## 2019-03-02 NOTE — TOC Transition Note (Addendum)
Transition of Care [facility]) - CM/SW Discharge Note   Patient Details  Name: Jahlani Domingo Cashin MRN: [id number] Date of Birth: 1925/07/16  Transition of Care [facility]) CM/SW Contact:  Shelbie Hutching, RN Phone Number: 03/02/2019, 5:20 PM   Clinical Narrative:    Patient will discharge home with home health services, patient needs home health RN, PT and aide.  Sharmon Revere with [facility] has agreed to accept referral for home health services.  Patient's son Charlotte Crumb will provide transportation home.  Patient has all needed equipment.     Final next level of care: [place] Barriers to Discharge: Barriers Resolved   Patient Goals and CMS Choice Patient states their goals for this hospitalization and ongoing recovery are:: to go home CMS Medicare.gov Compare Post Acute Care list provided to:: Patient Choice offered to / list presented to : Patient  Discharge Placement                       Discharge Plan and Services   Discharge Planning Services: CM Consult Post Acute Care Choice: Home Health                    HH Arranged: RN, PT, Nurse's Aide [place] Date [place]: 03/02/19 Time HH Agency Contacted: 1700 Representative spoke with at [place]: [place] ([place]) Interventions     Readmission Risk Interventions No flowsheet data found.

## 2019-03-02 NOTE — Progress Notes (Signed)
Pt reported bleeding from incision site. Held pressure 20 min. Notified MD, Eloise Levels. Removed pressure dressing. Held pressure an additional 15 min. Stopped heparin drip. Notified pharmacy of stopping heparin drip. Bleeding currently appears to have stop applied dressing with 2X2 and abd. binder per MD instructions. VS BP 148/62 Pulse 58. SpO2 100% RA. Will continue to monitor.

## 2019-03-02 NOTE — Discharge Summary (Signed)
[facility]    Discharge Summary    Patient ID:  [name] MRN: [id number] DOB/AGE: Mar 23, 1926 85 y.o.  Admit date: 03/01/2019 Discharge date: 03/02/2019 Date of Surgery: 03/01/2019 Surgeon: Surgeon(s): Schnier, Dolores Lory, MD  Admission Diagnosis: Atherosclerosis of artery of extremity with rest pain [facility]) [I70.229]  Discharge Diagnoses:  Atherosclerosis of artery of extremity with rest pain [facility]) [I70.229]  Secondary Diagnoses: Past Medical History:  Diagnosis Date  . A-fib ([place])   . CHF (congestive heart failure) ([place])   . Diabetes mellitus without complication ([place])   . Hypertension     Procedure(s): LOWER EXTREMITY ANGIOGRAPHY  Discharged Condition: good  HPI:  The patient presented to the emergency room the day prior to admission with pain of her right lower extremity.  She has known atherosclerotic occlusive disease and is currently dealing with a heel ulceration.  She noted the pain of been ongoing for about 3 weeks and that her ulcer had seem to open back up again.  Noninvasive studies demonstrated occlusion of the previously placed stents.    The following morning she was taken to angiography where successful thrombectomy of her occluded stents was performed and angioplasty of the anterior tibial artery was successful.  As was angioplasty and stent placement of the SFA.  Given the extensive thrombus noted I elected to maintain the patient on heparin overnight.  Post procedure day #1 she is doing well her foot is pink and warm.  She has been assessed by physical therapy and is felt appropriate for discharge.    Hospital Course:  [name] is a 85 y.o. female is S/P Right Procedure(s): LOWER EXTREMITY ANGIOGRAPHY Extubated: POD # 0 Physical exam: Right foot is hyperemic with brisk capillary refill Post-op wounds clean, dry, intact or healing well Pt. Ambulating, voiding and taking PO diet without difficulty. Pt pain controlled  with PO pain meds. Labs as below Complications:none  Consults:    Significant Diagnostic Studies: CBC Lab Results  Component Value Date   WBC 8.8 03/02/2019   HGB 11.7 (L) 03/02/2019   HCT 35.5 (L) 03/02/2019   MCV 96.2 03/02/2019   PLT 210 03/02/2019    BMET    Component Value Date/Time   NA 140 02/28/2019 1107   NA 141 08/03/2011 0729   K 4.1 02/28/2019 1107   K 3.6 08/03/2011 0729   CL 104 02/28/2019 1107   CL 105 08/03/2011 0729   CO2 23 02/28/2019 1107   CO2 26 08/03/2011 0729   GLUCOSE 162 (H) 02/28/2019 1107   GLUCOSE 149 (H) 08/03/2011 0729   BUN 41 (H) 02/28/2019 1107   BUN 16 08/03/2011 0729   CREATININE 1.66 (H) 02/28/2019 1107   CREATININE 1.07 08/03/2011 0729   CALCIUM 9.4 02/28/2019 1107   CALCIUM 9.5 08/03/2011 0729   GFRNONAA 27 (L) 02/28/2019 1107   GFRNONAA 48 (L) 08/03/2011 0729   GFRAA 31 (L) 02/28/2019 1107   GFRAA 56 (L) 08/03/2011 0729   COAG Lab Results  Component Value Date   INR 1.1 03/01/2019   INR 1.13 08/12/2017   INR 2.24 05/30/2017     Disposition:  Discharge to :Home Discharge Instructions    Call MD for:  redness, tenderness, or signs of infection (pain, swelling, bleeding, redness, odor or green/yellow discharge around incision site)   Complete by: As directed    Call MD for:  severe or increased pain, loss or decreased feeling  in affected limb(s)   Complete by: As  directed    Call MD for:  temperature >100.5   Complete by: As directed    No dressing needed   Complete by: As directed    Replace only if drainage present   Resume previous diet   Complete by: As directed      Allergies as of 03/02/2019      Reactions   Sulfa Antibiotics Hives      Medication List    TAKE these medications   acetaminophen 325 MG tablet Commonly known as: TYLENOL Take 325-650 mg by mouth every 6 (six) hours as needed (for pain.).   allopurinol 100 MG tablet Commonly known as: ZYLOPRIM Take 100 mg by mouth daily.   CALCIUM  600+D3 PO Take 1 tablet by mouth daily.   carvedilol 6.25 MG tablet Commonly known as: COREG Take 6.25 mg by mouth 2 (two) times daily.   cholecalciferol 25 MCG (1000 UT) tablet Commonly known as: VITAMIN D Take 1,000 Units by mouth daily.   clopidogrel 75 MG tablet Commonly known as: Plavix Take 1 tablet (75 mg total) by mouth daily.   diltiazem 120 MG 24 hr capsule Commonly known as: CARDIZEM CD Take 120 mg by mouth daily.   furosemide 20 MG tablet Commonly known as: LASIX Take 40 mg by mouth daily.   gabapentin 100 MG capsule Commonly known as: NEURONTIN Take 100 mg by mouth daily as needed (pain).   HumaLOG KwikPen 100 UNIT/ML KwikPen Generic drug: insulin lispro Inject 8-10 Units into the skin 3 (three) times daily before meals. Per sliding scale   Levemir FlexTouch 100 UNIT/ML Pen Generic drug: Insulin Detemir Inject 8 Units into the skin at bedtime.   levothyroxine 88 MCG tablet Commonly known as: SYNTHROID Take 88 mcg by mouth daily before breakfast.   lisinopril 5 MG tablet Commonly known as: ZESTRIL Take 5 mg by mouth daily.   lovastatin 20 MG tablet Commonly known as: MEVACOR Take 20 mg by mouth daily with supper.   meclizine 25 MG tablet Commonly known as: ANTIVERT Take 25 mg by mouth 3 (three) times daily as needed for dizziness.   Melatonin 3 MG Tabs Take 3 mg by mouth at bedtime as needed (sleep).   metFORMIN 500 MG tablet Commonly known as: GLUCOPHAGE Take 500 mg by mouth 2 (two) times daily with a meal.   potassium chloride SA 20 MEQ tablet Commonly known as: KLOR-CON Take 20 mEq by mouth daily as needed (cramping).   PRESERVISION AREDS 2 PO Take 1 tablet by mouth every evening.      Verbal and written Discharge instructions given to the patient. Wound care per Discharge AVS Follow-up Information    Schnier, Dolores Lory, MD. Schedule an appointment as soon as possible for a visit in 2 weeks.   Specialties: Vascular Surgery,  Cardiology, Radiology, Vascular Surgery Why: with right leg arterial Contact information: [place] [address] [id number]           Signed: [name], MD  03/02/2019, 5:56 PM

## 2019-03-02 NOTE — Evaluation (Signed)
Physical Therapy Evaluation Patient Details Name: [name] MRN: [id number] DOB: 09/11/1925 Today's Date: 03/02/2019   History of Present Illness  Pt is 85 y.o. female  presented to the emergency room 11/18 with the complaint of increasing pain in her right lower extremity and known CAD, R foot wound.  She underwent angiography with intervention March 3 of 2020.  Physical exam and noninvasive studies are suggestive of reocclusion of the stented SFA and popliteal arteries. She is s/p right leg angiography.    Clinical Impression  Patient alert, in bed, family at bedside. Reported no pain at rest or with ambulation. The patient reported that at baseline she ambulates with a RW, has 24/7 supervision/assistance as needed, no falls in the last 6 months.   The patient was able to lift all extremities against gravity. R foot dressing noted in place throughout session. Mobilized to sit edge of bed with supervision, fair balance in sitting. Sit <> Stand performed three times this session, CGA-supervision. Verbal cues for hand placement and technique to enhance safety, family and pt verbalized understanding, pt able to complete with fair carry over. The patient ambulated ~69ft with RW and CGA. Exhibited shuffling gait, step to gait pattern due to R foot wound, significantly decreased gait velocity no LOB noted.   Overall the patient demonstrated mild deficits (see "PT Problem List") that impede the patient's functional abilities, safety, and mobility and would benefit from skilled PT intervention. Recommendation is HHPT with intermittent supervision to maximize safety, mobility, and independence.     Follow Up Recommendations Home health PT;Supervision - Intermittent    Equipment Recommendations  None recommended by PT;Other (comment)(pt has extensive DME)    Recommendations for Other Services       Precautions / Restrictions Precautions Precautions: Fall Precaution Comments: R foot  wound Restrictions Weight Bearing Restrictions: No      Mobility  Bed Mobility Overal bed mobility: Needs Assistance Bed Mobility: Supine to Sit     Supine to sit: HOB elevated;Supervision        Transfers Overall transfer level: Needs assistance Equipment used: Rolling walker (2 wheeled) Transfers: Sit to/from Stand Sit to Stand: Supervision         General transfer comment: verbal cues and demonstration for safe technique  Ambulation/Gait Ambulation/Gait assistance: Min guard Gait Distance (Feet): 40 Feet Assistive device: Rolling walker (2 wheeled)   Gait velocity: decreased   General Gait Details: Pt with shuffling gait, prefers step to gait pattern due to R foot wound. no LOB noted  Stairs            Wheelchair Mobility    Modified Rankin (Stroke Patients Only)       Balance Overall balance assessment: Needs assistance Sitting-balance support: Feet supported Sitting balance-[name] Scale: Fair       Standing balance-[name] Scale: Fair                               Pertinent Vitals/Pain Pain Assessment: No/denies pain    Home Living Family/patient expects to be discharged to:: Private residence Living Arrangements: Spouse/significant other;Children(24/7 assistance from children) Available Help at Discharge: Family;Available 24 hours/day Type of Home: House Home Access: Ramped entrance     Home Layout: One level Home Equipment: Walker - 2 wheels;Walker - 4 wheels;Cane - single point;Grab bars - tub/shower;Grab bars - toilet;Bedside commode;Shower seat;Wheelchair - manual;Toilet riser      Prior Function Level of Independence: Needs assistance  Gait / Transfers Assistance Needed: ambulates with RW at baseline  ADL's / Homemaking Assistance Needed: bird bath, able to dress mod I. Family performs cooking/cleaning/shopping etc  Comments: no falls in the last 6 months     Hand Dominance   Dominant Hand: Right     Extremity/Trunk Assessment   Upper Extremity Assessment Upper Extremity Assessment: Generalized weakness    Lower Extremity Assessment Lower Extremity Assessment: Generalized weakness    Cervical / Trunk Assessment Cervical / Trunk Assessment: Kyphotic  Communication   Communication: No difficulties  Cognition Arousal/Alertness: Awake/alert Behavior During Therapy: WFL for tasks assessed/performed Overall Cognitive Status: Within Functional Limits for tasks assessed                                        General Comments      Exercises     Assessment/Plan    PT Assessment Patient needs continued PT services  PT Problem List Decreased strength;Decreased mobility;Decreased range of motion;Decreased activity tolerance;Decreased balance;Decreased knowledge of use of DME       PT Treatment Interventions DME instruction;Therapeutic exercise;Gait training;Balance training;Stair training;Neuromuscular re-education;Functional mobility training;Therapeutic activities;Patient/family education    PT Goals (Current goals can be found in the Care Plan section)  Acute Rehab PT Goals Patient Stated Goal: to go home PT Goal Formulation: With patient Time For Goal Achievement: 03/16/19 Potential to Achieve Goals: Good    Frequency Min 2X/week   Barriers to discharge        Co-evaluation               AM-PAC PT "6 Clicks" Mobility  Outcome Measure Help needed turning from your back to your side while in a flat bed without using bedrails?: A Little Help needed moving from lying on your back to sitting on the side of a flat bed without using bedrails?: A Little Help needed moving to and from a bed to a chair (including a wheelchair)?: A Little Help needed standing up from a chair using your arms (e.g., wheelchair or bedside chair)?: A Little Help needed to walk in hospital room?: A Little Help needed climbing 3-5 steps with a railing? : A Little 6 Click  Score: 18    End of Session Equipment Utilized During Treatment: Gait belt Activity Tolerance: Patient tolerated treatment well Patient left: in chair;with chair alarm set;with call bell/phone within reach;with family/visitor present Nurse Communication: Mobility status PT Visit Diagnosis: Muscle weakness (generalized) (M62.81);Other abnormalities of gait and mobility (R26.89)    Time: [id number] PT Time Calculation (min) (ACUTE ONLY): 20 min   Charges:   PT Evaluation $PT Eval Low Complexity: 1 Low PT Treatments $Therapeutic Exercise: 8-22 mins        Lieutenant Diego PT, DPT 4:10 PM,03/02/19 [phone]

## 2019-03-02 NOTE — Care Management Obs Status (Signed)
[place] NOTIFICATION   Patient Details  Name: [name] MRN: [id number] Date of Birth: June 28, 1925   Medicare Observation Status Notification Given:  Yes    Shelbie Hutching, RN 03/02/2019, 3:20 PM

## 2019-03-02 NOTE — Progress Notes (Signed)
Dressing at site changed to 2x2 gauze as directed by Dr. Delana Meyer. Site is clean, dry, and intact. Madlyn Frankel, RN

## 2019-04-23 DIAGNOSIS — R809 Proteinuria, unspecified: Secondary | ICD-10-CM | POA: Insufficient documentation

## 2019-04-27 ENCOUNTER — Other Ambulatory Visit (INDEPENDENT_AMBULATORY_CARE_PROVIDER_SITE_OTHER): Payer: Self-pay | Admitting: Vascular Surgery

## 2019-04-27 DIAGNOSIS — Z95820 Peripheral vascular angioplasty status with implants and grafts: Secondary | ICD-10-CM

## 2019-04-27 DIAGNOSIS — I70234 Atherosclerosis of native arteries of right leg with ulceration of heel and midfoot: Secondary | ICD-10-CM

## 2019-04-30 ENCOUNTER — Ambulatory Visit (INDEPENDENT_AMBULATORY_CARE_PROVIDER_SITE_OTHER): Payer: Medicare Other | Admitting: Vascular Surgery

## 2019-04-30 ENCOUNTER — Encounter (INDEPENDENT_AMBULATORY_CARE_PROVIDER_SITE_OTHER): Payer: Self-pay | Admitting: Vascular Surgery

## 2019-04-30 ENCOUNTER — Ambulatory Visit (INDEPENDENT_AMBULATORY_CARE_PROVIDER_SITE_OTHER): Payer: Medicare Other

## 2019-04-30 ENCOUNTER — Other Ambulatory Visit: Payer: Self-pay

## 2019-04-30 VITALS — BP 186/74 | HR 57 | Resp 12 | Ht 63.0 in | Wt 112.0 lb

## 2019-04-30 DIAGNOSIS — E782 Mixed hyperlipidemia: Secondary | ICD-10-CM | POA: Diagnosis not present

## 2019-04-30 DIAGNOSIS — Z794 Long term (current) use of insulin: Secondary | ICD-10-CM

## 2019-04-30 DIAGNOSIS — I70234 Atherosclerosis of native arteries of right leg with ulceration of heel and midfoot: Secondary | ICD-10-CM

## 2019-04-30 DIAGNOSIS — I7025 Atherosclerosis of native arteries of other extremities with ulceration: Secondary | ICD-10-CM

## 2019-04-30 DIAGNOSIS — E1151 Type 2 diabetes mellitus with diabetic peripheral angiopathy without gangrene: Secondary | ICD-10-CM

## 2019-04-30 DIAGNOSIS — I10 Essential (primary) hypertension: Secondary | ICD-10-CM | POA: Diagnosis not present

## 2019-04-30 DIAGNOSIS — Z95820 Peripheral vascular angioplasty status with implants and grafts: Secondary | ICD-10-CM

## 2019-04-30 NOTE — Progress Notes (Signed)
MRN : [id number]  [name] is a 86 y.o. (1925-06-26) female who presents with chief complaint of No chief complaint on file. Marland Kitchen  History of Present Illness:   The patient returns to the office for followup and review status post angiogram with intervention 03/01/2019.   Percutaneous transluminal angioplasty and stent placement right superficial femoral and popliteal arteries to 6 mm and percutaneous transluminal angioplasty right anterior tibial to 4 mm with Lutonix drug-eluting balloon. At that time mechanical thrombectomy of the right SFA and popliteal using the penumbra CAT 6 device was also performed.  The patient notes improvement in the lower extremity symptoms. No interval shortening of the patient's claudication distance or rest pain symptoms. Previous wounds have now healed.  No new ulcers or wounds have occurred since the last visit.  There have been no significant changes to the patient's overall health care.  The patient denies amaurosis fugax or recent TIA symptoms. There are no recent neurological changes noted. The patient denies history of DVT, PE or superficial thrombophlebitis. The patient denies recent episodes of angina or shortness of breath.   Duplex US of the right lower extremity arterial system shows patent stent in the SFA  No outpatient medications have been marked as taking for the 04/30/19 encounter (Appointment) with Delana Meyer, Dolores Lory, MD.    Past Medical History:  Diagnosis Date  . A-fib ([place])   . CHF (congestive heart failure) ([place])   . Diabetes mellitus without complication ([place])   . Hypertension     Past Surgical History:  Procedure Laterality Date  . CHOLECYSTECTOMY    . LOWER EXTREMITY ANGIOGRAPHY Right 08/12/2017   Procedure: LOWER EXTREMITY ANGIOGRAPHY;  Surgeon: Katha Cabal, MD;  Location: [facility];  Service: Cardiovascular;  Laterality: Right;  . LOWER EXTREMITY ANGIOGRAPHY Right 06/13/2018   Procedure: LOWER  EXTREMITY ANGIOGRAPHY;  Surgeon: Katha Cabal, MD;  Location: [facility];  Service: Cardiovascular;  Laterality: Right;  . LOWER EXTREMITY ANGIOGRAPHY Right 03/01/2019   Procedure: LOWER EXTREMITY ANGIOGRAPHY;  Surgeon: Katha Cabal, MD;  Location: [facility];  Service: Cardiovascular;  Laterality: Right;    Social History Social History   Tobacco Use  . Smoking status: Never Smoker  . Smokeless tobacco: Never Used  Substance Use Topics  . Alcohol use: No  . Drug use: No    Family History Family History  Problem Relation Age of Onset  . Cancer Brother     Allergies  Allergen Reactions  . Sulfa Antibiotics Hives     REVIEW OF SYSTEMS (Negative unless checked)  Constitutional: [] Weight loss  [] Fever  [] Chills Cardiac: [] Chest pain   [] Chest pressure   [] Palpitations   [] Shortness of breath when laying flat   [] Shortness of breath with exertion. Vascular:  [x] Pain in legs with walking   [] Pain in legs at rest  [] History of DVT   [] Phlebitis   [] Swelling in legs   [] Varicose veins   [x] Non-healing ulcers Pulmonary:   [] Uses home oxygen   [] Productive cough   [] Hemoptysis   [] Wheeze  [] COPD   [] Asthma Neurologic:  [] Dizziness   [] Seizures   [] History of stroke   [] History of TIA  [] Aphasia   [] Vissual changes   [] Weakness or numbness in arm   [] Weakness or numbness in leg Musculoskeletal:   [] Joint swelling   [] Joint pain   [] Low back pain Hematologic:  [] Easy bruising  [] Easy bleeding   [] Hypercoagulable state   [] Anemic Gastrointestinal:  []   Diarrhea   [] Vomiting  [] Gastroesophageal reflux/heartburn   [] Difficulty swallowing. Genitourinary:  [] Chronic kidney disease   [] Difficult urination  [] Frequent urination   [] Blood in urine Skin:  [] Rashes   [x] Ulcers  Psychological:  [] History of anxiety   []  History of major depression.  Physical Examination  There were no vitals filed for this visit. There is no height or weight on file to calculate  BMI. Gen: WD/WN, NAD Head: [place]/AT, No temporalis wasting.  Ear/Nose/Throat: Hearing grossly intact, nares w/o erythema or drainage Eyes: PER, EOMI, sclera nonicteric.  Neck: Supple, no large masses.   Pulmonary:  Good air movement, no audible wheezing bilaterally, no use of accessory muscles.  Cardiac: RRR, no JVD Vascular:  Right heel ulcer uninfected Vessel Right Left  PT Not Palpable Not Palpable  DP Trace Palpable Not Palpable  Gastrointestinal: Non-distended. No guarding/no peritoneal signs.  Musculoskeletal: M/S 5/5 throughout.  No deformity or atrophy.  Neurologic: CN 2-12 intact. Symmetrical.  Speech is fluent. Motor exam as listed above. Psychiatric: Judgment intact, Mood & affect appropriate for pt's clinical situation. Dermatologic: No rashes or ulcers noted.  No changes consistent with cellulitis. Lymph : No lichenification or skin changes of chronic lymphedema.  CBC Lab Results  Component Value Date   WBC 8.8 03/02/2019   HGB 11.7 (L) 03/02/2019   HCT 35.5 (L) 03/02/2019   MCV 96.2 03/02/2019   PLT 210 03/02/2019    BMET    Component Value Date/Time   NA 140 02/28/2019 1107   NA 141 08/03/2011 0729   K 4.1 02/28/2019 1107   K 3.6 08/03/2011 0729   CL 104 02/28/2019 1107   CL 105 08/03/2011 0729   CO2 23 02/28/2019 1107   CO2 26 08/03/2011 0729   GLUCOSE 162 (H) 02/28/2019 1107   GLUCOSE 149 (H) 08/03/2011 0729   BUN 41 (H) 02/28/2019 1107   BUN 16 08/03/2011 0729   CREATININE 1.66 (H) 02/28/2019 1107   CREATININE 1.07 08/03/2011 0729   CALCIUM 9.4 02/28/2019 1107   CALCIUM 9.5 08/03/2011 0729   GFRNONAA 27 (L) 02/28/2019 1107   GFRNONAA 48 (L) 08/03/2011 0729   GFRAA 31 (L) 02/28/2019 1107   GFRAA 56 (L) 08/03/2011 0729   CrCl cannot be calculated (Patient's most recent lab result is older than the maximum 21 days allowed.).  COAG Lab Results  Component Value Date   INR 1.1 03/01/2019   INR 1.13 08/12/2017   INR 2.24 05/30/2017     Radiology No results found.    Assessment/Plan 1. Atherosclerosis of native arteries of the extremities with ulceration ([place]) Recommend:  The patient is status post successful angiogram with intervention.  The patient reports that the claudication symptoms and leg pain is essentially gone.   The patient denies lifestyle limiting changes at this point in time.  No further invasive studies, angiography or surgery at this time The patient should continue walking and begin a more formal exercise program.  The patient should continue antiplatelet therapy and aggressive treatment of the lipid abnormalities  Smoking cessation was again discussed  The patient should continue wearing graduated compression socks 10-15 mmHg strength to control the mild edema.  Patient should undergo noninvasive studies as ordered. The patient will follow up with me after the studies.   - VAS [place] LOWER EXTREMITY ARTERIAL DUPLEX; Future - VAS [place] ABI WITH/WO TBI; Future  2. Mixed hyperlipidemia Continue statin as ordered and reviewed, no changes at this time   3. Type 2 diabetes mellitus  with diabetic peripheral angiopathy without gangrene, with long-term current use of insulin (HCC) Continue hypoglycemic medications as already ordered, these medications have been reviewed and there are no changes at this time.  Hgb A1C to be monitored as already arranged by primary service   4. Essential hypertension Continue antihypertensive medications as already ordered, these medications have been reviewed and there are no changes at this time.   [name], MD  04/30/2019 10:56 AM

## 2019-07-30 ENCOUNTER — Encounter (INDEPENDENT_AMBULATORY_CARE_PROVIDER_SITE_OTHER): Payer: Self-pay | Admitting: Nurse Practitioner

## 2019-07-30 ENCOUNTER — Other Ambulatory Visit (INDEPENDENT_AMBULATORY_CARE_PROVIDER_SITE_OTHER): Payer: Self-pay | Admitting: Nurse Practitioner

## 2019-07-30 ENCOUNTER — Ambulatory Visit (INDEPENDENT_AMBULATORY_CARE_PROVIDER_SITE_OTHER): Payer: Medicare Other

## 2019-07-30 ENCOUNTER — Other Ambulatory Visit
Admission: RE | Admit: 2019-07-30 | Discharge: 2019-07-30 | Disposition: A | Discharge status: Home / Self Care | Payer: Medicare Other | Source: Ambulatory Visit | Attending: Vascular Surgery | Admitting: Vascular Surgery

## 2019-07-30 ENCOUNTER — Encounter (INDEPENDENT_AMBULATORY_CARE_PROVIDER_SITE_OTHER): Payer: Self-pay

## 2019-07-30 ENCOUNTER — Ambulatory Visit (INDEPENDENT_AMBULATORY_CARE_PROVIDER_SITE_OTHER): Payer: Medicare Other | Admitting: Nurse Practitioner

## 2019-07-30 ENCOUNTER — Other Ambulatory Visit: Payer: Self-pay

## 2019-07-30 VITALS — BP 178/72 | HR 64 | Resp 16 | Wt 121.0 lb

## 2019-07-30 DIAGNOSIS — I998 Other disorder of circulatory system: Secondary | ICD-10-CM

## 2019-07-30 DIAGNOSIS — I7025 Atherosclerosis of native arteries of other extremities with ulceration: Secondary | ICD-10-CM | POA: Diagnosis not present

## 2019-07-30 DIAGNOSIS — E782 Mixed hyperlipidemia: Secondary | ICD-10-CM | POA: Diagnosis not present

## 2019-07-30 NOTE — Progress Notes (Signed)
Subjective:    Patient ID: [name], female    DOB: [birth year], 86 y.o.   MRN: [id number] Chief Complaint  Patient presents with  . Follow-up    ultrasound follow up    The patient returns to the office for followup and review of the noninvasive studies. There has been a significant deterioration in the lower extremity symptoms.  The patient notes interval shortening of their claudication distance and development of mild rest pain symptoms. No new ulcers or wounds have occurred since the last visit.  The patient stated that several days ago she had 2 extremely terrible cramps in her right lower extremity in the mid thigh area.  After that she began to suffer the rest pain symptoms in her right foot.  The patient has a previous history of intervention to the right lower extremity as well as stents in the SFA.  There have been no significant changes to the patient's overall health care.  The patient denies amaurosis fugax or recent TIA symptoms. There are no recent neurological changes noted. The patient denies history of DVT, PE or superficial thrombophlebitis. The patient denies recent episodes of angina or shortness of breath.   ABI's Rt=0.34 and Lt=[place] (previous ABI's Rt= 1.28 and Lt= [place]) Duplex US of the lower extremity arterial system shows biphasic waveforms in the common femoral as well as deep femoral artery until it reaches the proximal SFA which is occluded down to the distal popliteal artery.  The tibial arteries have severely dampened monophasic waveforms with no toe waveform present.  The patient's TBI on the right lower extremity is 0.00 compared to 0.51.  Patient's previous duplex of the right lower extremity on 04/30/2019 showed biphasic waveforms throughout the right lower extremity.   Review of Systems  Cardiovascular:       Rest pain  Skin: Positive for wound.  Neurological: Positive for weakness.  All other systems reviewed and are negative.      Objective:   Physical Exam Vitals reviewed. Exam conducted with a chaperone present (Son present).  Cardiovascular:     Rate and Rhythm: Normal rate and regular rhythm.     Pulses:          Dorsalis pedis pulses are 0 on the right side.       Posterior tibial pulses are 0 on the right side.     Heart sounds: Murmur present.  Neurological:     Mental Status: She is alert and oriented to person, place, and time.  Psychiatric:        Mood and Affect: Mood normal.        Behavior: Behavior normal.        Thought Content: Thought content normal.        Judgment: Judgment normal.     BP (!) 178/72 (BP Location: Left Arm)   Pulse 64   Resp 16   Wt 121 lb (54.9 kg)   BMI 21.43 kg/m   Past Medical History:  Diagnosis Date  . A-fib ([place])   . CHF (congestive heart failure) ([place])   . Diabetes mellitus without complication ([place])   . Hypertension     Social History   Socioeconomic History  . Marital status: Married    Spouse name: Not on file  . Number of children: Not on file  . Years of education: Not on file  . Highest education level: Not on file  Occupational History  . Not on file  Tobacco Use  .  Smoking status: Never Smoker  . Smokeless tobacco: Never Used  Substance and Sexual Activity  . Alcohol use: No  . Drug use: No  . Sexual activity: Not on file  Other Topics Concern  . Not on file  Social History Narrative  . Not on file   Social Determinants of Health   Financial Resource Strain:   . Difficulty of Paying Living Expenses:   Food Insecurity:   . Worried About Charity fundraiser in the Last Year:   . Arboriculturist in the Last Year:   Transportation Needs:   . Film/video editor (Medical):   Marland Kitchen Lack of Transportation (Non-Medical):   Physical Activity:   . Days of Exercise per Week:   . Minutes of Exercise per Session:   Stress:   . Feeling of Stress :   Social Connections:   . Frequency of Communication with Friends and Family:   . Frequency of Social  Gatherings with Friends and Family:   . Attends Religious Services:   . Active Member of Clubs or Organizations:   . Attends Archivist Meetings:   Marland Kitchen Marital Status:   Intimate Partner Violence:   . Fear of Current or Ex-Partner:   . Emotionally Abused:   Marland Kitchen Physically Abused:   . Sexually Abused:     Past Surgical History:  Procedure Laterality Date  . CHOLECYSTECTOMY    . LOWER EXTREMITY ANGIOGRAPHY Right 08/12/2017   Procedure: LOWER EXTREMITY ANGIOGRAPHY;  Surgeon: Katha Cabal, MD;  Location: [facility];  Service: Cardiovascular;  Laterality: Right;  . LOWER EXTREMITY ANGIOGRAPHY Right 06/13/2018   Procedure: LOWER EXTREMITY ANGIOGRAPHY;  Surgeon: Katha Cabal, MD;  Location: [facility];  Service: Cardiovascular;  Laterality: Right;  . LOWER EXTREMITY ANGIOGRAPHY Right 03/01/2019   Procedure: LOWER EXTREMITY ANGIOGRAPHY;  Surgeon: Katha Cabal, MD;  Location: [facility];  Service: Cardiovascular;  Laterality: Right;    Family History  Problem Relation Age of Onset  . Cancer Brother     Allergies  Allergen Reactions  . Sulfa Antibiotics Hives       Assessment & Plan:   1. Ischemic leg I long discussion with the patient and son as to ischemic-like symptoms as well as the progression of ischemia.  The patient was extremely hesitant to undergo any further procedure after her most recent experience she had some bleeding that was difficult to stop.  I discussed with the patient that should she elect not to proceed with angiogram today that she currently have limb threatening ischemia which would very likely result in either permanent disability or possible amputation if intervention was delayed.   Recommend:  The patient has evidence of severe atherosclerotic changes of both lower extremities with rest pain that is associated with preulcerative changes and impending tissue loss of the foot.  This represents a limb  threatening ischemia and places the patient at the risk for limb loss.  Patient should undergo angiography of the lower extremities with the hope for intervention for limb salvage.  The risks and benefits as well as the alternative therapies was discussed in detail with the patient.  All questions were answered.  Patient agrees to proceed with angiography.  The patient will follow up with me in the office after the procedure.       2. Mixed hyperlipidemia Continue statin as ordered and reviewed, no changes at this time    Current Outpatient Medications on File  Prior to Visit  Medication Sig Dispense Refill  . allopurinol (ZYLOPRIM) 100 MG tablet Take 100 mg by mouth daily.    . Calcium Carb-Cholecalciferol (CALCIUM 600+D3 PO) Take 1 tablet by mouth daily.    . carvedilol (COREG) 6.25 MG tablet Take 6.25 mg by mouth 2 (two) times daily.    . clopidogrel (PLAVIX) 75 MG tablet Take 1 tablet (75 mg total) by mouth daily. 30 tablet 11  . furosemide (LASIX) 20 MG tablet Take 40 mg by mouth daily.     Marland Kitchen gabapentin (NEURONTIN) 100 MG capsule Take 100 mg by mouth daily as needed (pain).     Marland Kitchen glucose blood (ACCU-CHEK AVIVA PLUS) test strip CHECK BLOOD SUGAR 3 TIMES DAILY    . HUMALOG KWIKPEN 100 UNIT/ML KiwkPen Inject 8-10 Units into the skin 3 (three) times daily before meals. Per sliding scale    . LEVEMIR FLEXTOUCH 100 UNIT/ML Pen Inject 8 Units into the skin at bedtime.     Marland Kitchen levothyroxine (SYNTHROID, LEVOTHROID) 88 MCG tablet Take 88 mcg by mouth daily before breakfast.     . lisinopril (ZESTRIL) 5 MG tablet Take 5 mg by mouth daily.    Marland Kitchen lovastatin (MEVACOR) 20 MG tablet Take 20 mg by mouth daily with supper.     . meclizine (ANTIVERT) 25 MG tablet Take 25 mg by mouth 3 (three) times daily as needed for dizziness.     . Multiple Vitamins-Minerals (PRESERVISION AREDS 2 PO) Take 1 tablet by mouth every evening.    . mupirocin ointment (BACTROBAN) 2 % Apply 1 application topically 3 (three)  times daily.    . potassium chloride SA (K-DUR,KLOR-CON) 20 MEQ tablet Take 20 mEq by mouth daily as needed (cramping).     . traMADol (ULTRAM) 50 MG tablet Take by mouth every 6 (six) hours as needed.    Marland Kitchen ACCU-CHEK AVIVA PLUS test strip     . acetaminophen (TYLENOL) 325 MG tablet Take 325-650 mg by mouth every 6 (six) hours as needed (for pain.).    Marland Kitchen calcium carbonate (OS-CAL) 600 MG TABS tablet TAKE (1) TABLET BY MOUTH EVERY DAY    . cholecalciferol (VITAMIN D) 25 MCG (1000 UT) tablet Take 1,000 Units by mouth daily.    . collagenase (SANTYL) ointment Santyl 250 unit/gram topical ointment    . diltiazem (CARDIZEM CD) 120 MG 24 hr capsule Take 120 mg by mouth daily.     . Melatonin 3 MG TABS Take 3 mg by mouth at bedtime as needed (sleep).    . metFORMIN (GLUCOPHAGE) 500 MG tablet Take 500 mg by mouth 2 (two) times daily with a meal.      No current facility-administered medications on file prior to visit.    There are no Patient Instructions on file for this visit. No follow-ups on file.   Kris Hartmann, NP

## 2019-07-31 ENCOUNTER — Inpatient Hospital Stay
Admission: RE | Admit: 2019-07-31 | Discharge: 2019-08-01 | DRG: 271 | Disposition: A | Discharge status: Home / Self Care | Payer: Medicare Other | Attending: Vascular Surgery | Admitting: Vascular Surgery

## 2019-07-31 ENCOUNTER — Other Ambulatory Visit: Payer: Self-pay

## 2019-07-31 ENCOUNTER — Encounter: Payer: Self-pay | Admitting: Vascular Surgery

## 2019-07-31 ENCOUNTER — Encounter
Admission: RE | Disposition: A | Discharge status: Home / Self Care | Payer: Self-pay | Source: Home / Self Care | Attending: Vascular Surgery

## 2019-07-31 DIAGNOSIS — I480 Paroxysmal atrial fibrillation: Secondary | ICD-10-CM | POA: Diagnosis present

## 2019-07-31 DIAGNOSIS — N185 Chronic kidney disease, stage 5: Secondary | ICD-10-CM | POA: Diagnosis present

## 2019-07-31 DIAGNOSIS — I998 Other disorder of circulatory system: Secondary | ICD-10-CM | POA: Diagnosis not present

## 2019-07-31 DIAGNOSIS — E1122 Type 2 diabetes mellitus with diabetic chronic kidney disease: Secondary | ICD-10-CM | POA: Diagnosis present

## 2019-07-31 DIAGNOSIS — E782 Mixed hyperlipidemia: Secondary | ICD-10-CM | POA: Diagnosis present

## 2019-07-31 DIAGNOSIS — Z79899 Other long term (current) drug therapy: Secondary | ICD-10-CM

## 2019-07-31 DIAGNOSIS — Z20822 Contact with and (suspected) exposure to covid-19: Secondary | ICD-10-CM | POA: Diagnosis present

## 2019-07-31 DIAGNOSIS — I120 Hypertensive chronic kidney disease with stage 5 chronic kidney disease or end stage renal disease: Secondary | ICD-10-CM | POA: Diagnosis present

## 2019-07-31 DIAGNOSIS — Z794 Long term (current) use of insulin: Secondary | ICD-10-CM | POA: Diagnosis not present

## 2019-07-31 DIAGNOSIS — E1151 Type 2 diabetes mellitus with diabetic peripheral angiopathy without gangrene: Principal | ICD-10-CM | POA: Diagnosis present

## 2019-07-31 DIAGNOSIS — Z79890 Hormone replacement therapy (postmenopausal): Secondary | ICD-10-CM | POA: Diagnosis not present

## 2019-07-31 DIAGNOSIS — Z7902 Long term (current) use of antithrombotics/antiplatelets: Secondary | ICD-10-CM

## 2019-07-31 DIAGNOSIS — I70221 Atherosclerosis of native arteries of extremities with rest pain, right leg: Secondary | ICD-10-CM | POA: Diagnosis not present

## 2019-07-31 HISTORY — PX: LOWER EXTREMITY ANGIOGRAPHY: CATH118251

## 2019-07-31 LAB — CREATININE, SERUM
Creatinine, Ser: 1.82 mg/dL — ABNORMAL HIGH (ref 0.44–1.00)
GFR calc Af Amer: 28 mL/min — ABNORMAL LOW (ref >60)
GFR calc non Af Amer: 24 mL/min — ABNORMAL LOW (ref >60)

## 2019-07-31 LAB — GLUCOSE, CAPILLARY
Glucose-Capillary: 123 mg/dL — ABNORMAL HIGH (ref 70–99)
Glucose-Capillary: 127 mg/dL — ABNORMAL HIGH (ref 70–99)
Glucose-Capillary: 208 mg/dL — ABNORMAL HIGH (ref 70–99)
Glucose-Capillary: 154 mg/dL — ABNORMAL HIGH (ref 70–99)

## 2019-07-31 LAB — SARS CORONAVIRUS 2 (TAT 6-24 HRS): SARS Coronavirus 2: NEGATIVE

## 2019-07-31 LAB — HEPARIN LEVEL (UNFRACTIONATED): Heparin Unfractionated: 0.42 IU/mL (ref 0.30–0.70)

## 2019-07-31 LAB — BUN: BUN: 40 mg/dL — ABNORMAL HIGH (ref 8–23)

## 2019-07-31 SURGERY — LOWER EXTREMITY ANGIOGRAPHY
Anesthesia: Moderate Sedation | Site: Leg Lower | Laterality: Right

## 2019-07-31 MED ORDER — GABAPENTIN 100 MG PO CAPS: 100.0000 mg | ORAL_CAPSULE | Freq: Every evening | ORAL | Status: DC | PRN

## 2019-07-31 MED ORDER — ALTEPLASE 2 MG IJ SOLR
INTRAMUSCULAR | Status: DC | PRN
  Administered 2019-07-31: 8 mg

## 2019-07-31 MED ORDER — SODIUM CHLORIDE 0.9 % IV SOLN: INTRAVENOUS | Status: DC

## 2019-07-31 MED ORDER — TRAMADOL HCL 50 MG PO TABS
50.0000 mg | ORAL_TABLET | Freq: Four times a day (QID) | ORAL | Status: DC | PRN
  Administered 2019-07-31 – 2019-08-01 (×3): 50 mg via ORAL
  Filled 2019-07-31 (×3): qty 1

## 2019-07-31 MED ORDER — POTASSIUM CHLORIDE CRYS ER 20 MEQ PO TBCR
20.0000 meq | EXTENDED_RELEASE_TABLET | Freq: Every day | ORAL | Status: DC
  Administered 2019-07-31 – 2019-08-01 (×2): 20 meq via ORAL
  Filled 2019-07-31 (×2): qty 1

## 2019-07-31 MED ORDER — HEPARIN (PORCINE) 25000 UT/250ML-% IV SOLN
INTRAVENOUS | Status: AC
  Administered 2019-07-31: 700 [IU]/h via INTRAVENOUS
  Filled 2019-07-31: qty 250

## 2019-07-31 MED ORDER — ACETAMINOPHEN 325 MG PO TABS: 325.0000 mg | ORAL_TABLET | Freq: Four times a day (QID) | ORAL | Status: DC | PRN

## 2019-07-31 MED ORDER — ALTEPLASE 2 MG IJ SOLR
INTRAMUSCULAR | Status: AC
  Filled 2019-07-31: qty 12

## 2019-07-31 MED ORDER — MECLIZINE HCL 25 MG PO TABS
25.0000 mg | ORAL_TABLET | Freq: Three times a day (TID) | ORAL | Status: DC | PRN
  Filled 2019-07-31: qty 1

## 2019-07-31 MED ORDER — HEPARIN SODIUM (PORCINE) 1000 UNIT/ML IJ SOLN
INTRAMUSCULAR | Status: AC
  Filled 2019-07-31: qty 1

## 2019-07-31 MED ORDER — INSULIN ASPART 100 UNIT/ML ~~LOC~~ SOLN
0.0000 [IU] | Freq: Three times a day (TID) | SUBCUTANEOUS | Status: DC
  Administered 2019-07-31: 5 [IU] via SUBCUTANEOUS
  Administered 2019-08-01: 2 [IU] via SUBCUTANEOUS
  Administered 2019-08-01: 3 [IU] via SUBCUTANEOUS
  Filled 2019-07-31 (×3): qty 1

## 2019-07-31 MED ORDER — FENTANYL CITRATE (PF) 100 MCG/2ML IJ SOLN
INTRAMUSCULAR | Status: DC | PRN
  Administered 2019-07-31 (×4): 25 ug via INTRAVENOUS

## 2019-07-31 MED ORDER — IODIXANOL 320 MG/ML IV SOLN
INTRAVENOUS | Status: DC | PRN
  Administered 2019-07-31: 80 mL via INTRA_ARTERIAL

## 2019-07-31 MED ORDER — HEPARIN SODIUM (PORCINE) 1000 UNIT/ML IJ SOLN
INTRAMUSCULAR | Status: DC | PRN
  Administered 2019-07-31: 4000 [IU] via INTRAVENOUS
  Administered 2019-07-31: 2000 [IU] via INTRAVENOUS

## 2019-07-31 MED ORDER — CEFAZOLIN SODIUM-DEXTROSE 2-4 GM/100ML-% IV SOLN
INTRAVENOUS | Status: AC
  Administered 2019-07-31: 1 g via INTRAVENOUS
  Filled 2019-07-31: qty 100

## 2019-07-31 MED ORDER — FUROSEMIDE 20 MG PO TABS
20.0000 mg | ORAL_TABLET | Freq: Two times a day (BID) | ORAL | Status: DC
  Administered 2019-07-31 – 2019-08-01 (×2): 20 mg via ORAL
  Filled 2019-07-31 (×2): qty 1

## 2019-07-31 MED ORDER — DIPHENHYDRAMINE HCL 50 MG/ML IJ SOLN: 50.0000 mg | Freq: Once | INTRAMUSCULAR | Status: DC | PRN

## 2019-07-31 MED ORDER — INSULIN ASPART 100 UNIT/ML ~~LOC~~ SOLN: 0.0000 [IU] | Freq: Every day | SUBCUTANEOUS | Status: DC

## 2019-07-31 MED ORDER — MIDAZOLAM HCL 2 MG/2ML IJ SOLN
INTRAMUSCULAR | Status: DC | PRN
  Administered 2019-07-31 (×2): 0.5 mg via INTRAVENOUS
  Administered 2019-07-31: 1 mg via INTRAVENOUS
  Administered 2019-07-31: 0.5 mg via INTRAVENOUS

## 2019-07-31 MED ORDER — FAMOTIDINE 20 MG PO TABS: 40.0000 mg | ORAL_TABLET | Freq: Once | ORAL | Status: DC | PRN

## 2019-07-31 MED ORDER — MIDAZOLAM HCL 5 MG/5ML IJ SOLN
INTRAMUSCULAR | Status: AC
  Filled 2019-07-31: qty 5

## 2019-07-31 MED ORDER — ONDANSETRON HCL 4 MG/2ML IJ SOLN
4.0000 mg | Freq: Four times a day (QID) | INTRAMUSCULAR | Status: DC | PRN
  Administered 2019-08-01: 4 mg via INTRAVENOUS
  Filled 2019-07-31: qty 2

## 2019-07-31 MED ORDER — MIDAZOLAM HCL 2 MG/ML PO SYRP: 8.0000 mg | ORAL_SOLUTION | Freq: Once | ORAL | Status: DC | PRN

## 2019-07-31 MED ORDER — CEFAZOLIN SODIUM-DEXTROSE 2-4 GM/100ML-% IV SOLN: 2.0000 g | Freq: Once | INTRAVENOUS | Status: AC

## 2019-07-31 MED ORDER — LEVOTHYROXINE SODIUM 88 MCG PO TABS
88.0000 ug | ORAL_TABLET | Freq: Every day | ORAL | Status: DC
  Administered 2019-08-01: 88 ug via ORAL
  Filled 2019-07-31 (×2): qty 1

## 2019-07-31 MED ORDER — LISINOPRIL 10 MG PO TABS
5.0000 mg | ORAL_TABLET | Freq: Every day | ORAL | Status: DC
  Administered 2019-08-01: 5 mg via ORAL
  Filled 2019-07-31: qty 1

## 2019-07-31 MED ORDER — HEPARIN (PORCINE) 25000 UT/250ML-% IV SOLN: 700.0000 [IU]/h | INTRAVENOUS | Status: DC

## 2019-07-31 MED ORDER — CARVEDILOL 3.125 MG PO TABS
3.1250 mg | ORAL_TABLET | Freq: Two times a day (BID) | ORAL | Status: DC
  Filled 2019-07-31 (×4): qty 1

## 2019-07-31 MED ORDER — ASPIRIN EC 81 MG PO TBEC
81.0000 mg | DELAYED_RELEASE_TABLET | Freq: Every day | ORAL | Status: DC
  Administered 2019-08-01: 81 mg via ORAL
  Filled 2019-07-31: qty 1

## 2019-07-31 MED ORDER — HYDROMORPHONE HCL 1 MG/ML IJ SOLN: 1.0000 mg | Freq: Once | INTRAMUSCULAR | Status: DC | PRN

## 2019-07-31 MED ORDER — METHYLPREDNISOLONE SODIUM SUCC 125 MG IJ SOLR: 125.0000 mg | Freq: Once | INTRAMUSCULAR | Status: DC | PRN

## 2019-07-31 MED ORDER — ALLOPURINOL 100 MG PO TABS
100.0000 mg | ORAL_TABLET | Freq: Every day | ORAL | Status: DC
  Administered 2019-08-01: 100 mg via ORAL
  Filled 2019-07-31: qty 1

## 2019-07-31 MED ORDER — FENTANYL CITRATE (PF) 100 MCG/2ML IJ SOLN
INTRAMUSCULAR | Status: AC
  Filled 2019-07-31: qty 2

## 2019-07-31 SURGICAL SUPPLY — 27 items
BALLN LUTONIX 018 4X80X130 (BALLOONS) ×3
BALLN ULTRVRSE 018 2.5X100X150 (BALLOONS) ×3
BALLOON LUTONIX 018 4X80X130 (BALLOONS) ×1 IMPLANT
BALLOON ULTRVS 018 2.5X100X150 (BALLOONS) ×1 IMPLANT
CANISTER PENUMBRA ENGINE (MISCELLANEOUS) ×3 IMPLANT
CATH BEACON 5 .035 65 KMP TIP (CATHETERS) ×3 IMPLANT
CATH INDIGO CAT6 KIT (CATHETERS) ×3 IMPLANT
CATH INDIGO SEP 6 (CATHETERS) ×3 IMPLANT
CATH INFUS 135CMX50CM (CATHETERS) ×3 IMPLANT
CATH LIGHTNING 7 XTORQ 130 (CATHETERS) ×3 IMPLANT
CATH PIG 70CM (CATHETERS) ×3 IMPLANT
CATH ROTAREX 135 6FR (CATHETERS) ×3 IMPLANT
CATH SEEKER .018X150 (CATHETERS) ×3 IMPLANT
CATH VERT 5FR 125CM (CATHETERS) ×3 IMPLANT
DEVICE PRESTO INFLATION (MISCELLANEOUS) ×3 IMPLANT
DEVICE SAFEGUARD 24CM (GAUZE/BANDAGES/DRESSINGS) ×3 IMPLANT
DEVICE STARCLOSE SE CLOSURE (Vascular Products) ×3 IMPLANT
GLIDEWIRE ADV .035X260CM (WIRE) ×3 IMPLANT
GUIDEWIRE PFTE-COATED .018X300 (WIRE) ×3 IMPLANT
NEEDLE ENTRY 21GA 7CM ECHOTIP (NEEDLE) ×3 IMPLANT
PACK ANGIOGRAPHY (CUSTOM PROCEDURE TRAY) ×3 IMPLANT
SET INTRO CAPELLA COAXIAL (SET/KITS/TRAYS/PACK) ×3 IMPLANT
SHEATH BRITE TIP 5FRX11 (SHEATH) ×3 IMPLANT
SHEATH FLEXOR ANSEL2 7FRX45 (SHEATH) ×3 IMPLANT
SYR MEDRAD MARK 7 150ML (SYRINGE) ×3 IMPLANT
TUBE CONN 8.8X1320 FR HP M-F (CONNECTOR) ×6 IMPLANT
WIRE J 3MM .035X145CM (WIRE) ×3 IMPLANT

## 2019-07-31 NOTE — Progress Notes (Signed)
Left groin slowly bleeding, Sharyn Lull with [facility] was informed. Bedside injection of lido with epi was performed. New PAD with 40 ml of air was applied. Site appears to no longer be bleeding, will continue to monitor for bleeding.   Vaughan Sine, RN  07/31/2019

## 2019-07-31 NOTE — Consult Note (Signed)
[facility] for Heparin Indication: Ischemic Leg  Allergies  Allergen Reactions  . Sulfa Antibiotics Hives    Patient Measurements: Height: 5\' 3"  (160 cm) Weight: 53.5 kg (118 lb) IBW/kg (Calculated) : 52.4 Heparin Dosing Weight: 53.5 kg  Vital Signs: Temp: 97.7 F (36.5 C) (04/20 0903) Temp Source: Oral (04/20 0903) BP: 159/69 (04/20 1145) Pulse Rate: 61 (04/20 1145)  Labs: Recent Labs    07/31/19 0914  CREATININE 1.82*    Estimated Creatinine Clearance: 16.7 mL/min (A) (by C-G formula based on SCr of 1.82 mg/dL (H)).   Medical History: Past Medical History:  Diagnosis Date  . A-fib ([place])   . CHF (congestive heart failure) ([place])   . Diabetes mellitus without complication ([place])   . Hypertension     Medications:  Medications Prior to Admission  Medication Sig Dispense Refill Last Dose  . ACCU-CHEK AVIVA PLUS test strip    07/31/2019 at Unknown time  . acetaminophen (TYLENOL) 325 MG tablet Take 325-650 mg by mouth every 6 (six) hours as needed for headache.    Past Month at Unknown time  . allopurinol (ZYLOPRIM) 100 MG tablet Take 100 mg by mouth daily.   07/31/2019 at Unknown time  . carvedilol (COREG) 3.125 MG tablet Take 3.125 mg by mouth 2 (two) times daily with a meal.    07/31/2019 at Unknown time  . collagenase (SANTYL) ointment Santyl 250 unit/gram topical ointment   Past Week at Unknown time  . furosemide (LASIX) 20 MG tablet Take 20 mg by mouth 2 (two) times daily.    07/31/2019 at Unknown time  . gabapentin (NEURONTIN) 100 MG capsule Take 100-200 mg by mouth at bedtime as needed (pain).    07/30/2019 at Unknown time  . glucose blood (ACCU-CHEK AVIVA PLUS) test strip CHECK BLOOD SUGAR 3 TIMES DAILY   07/31/2019 at Unknown time  . HUMALOG KWIKPEN 100 UNIT/ML KiwkPen Inject 3 Units into the skin 3 (three) times daily after meals. Per sliding scale   07/31/2019 at Unknown time  . LEVEMIR FLEXTOUCH 100 UNIT/ML Pen Inject 8 Units  into the skin at bedtime.    07/30/2019 at Unknown time  . levothyroxine (SYNTHROID, LEVOTHROID) 88 MCG tablet Take 88 mcg by mouth daily before breakfast.    07/31/2019 at Unknown time  . lisinopril (ZESTRIL) 5 MG tablet Take 5 mg by mouth daily.    07/31/2019 at Unknown time  . lovastatin (MEVACOR) 20 MG tablet Take 20 mg by mouth daily with supper.    07/30/2019 at Unknown time  . meclizine (ANTIVERT) 25 MG tablet Take 25 mg by mouth 3 (three) times daily as needed for dizziness.    Past Month at Unknown time  . Multiple Vitamins-Minerals (PRESERVISION AREDS 2 PO) Take 1 tablet by mouth every evening.   07/31/2019 at Unknown time  . potassium chloride SA (K-DUR,KLOR-CON) 20 MEQ tablet Take 20 mEq by mouth daily.    07/31/2019 at Unknown time  . Vitamin D3 (VITAMIN D) 25 MCG tablet Take 2,000 Units by mouth daily.   07/31/2019 at Unknown time  . clopidogrel (PLAVIX) 75 MG tablet Take 1 tablet (75 mg total) by mouth daily. (Patient not taking: Reported on 07/30/2019) 30 tablet 11 Not Taking at Unknown time   Scheduled:  . allopurinol  100 mg Oral Daily  . [START ON 08/01/2019] aspirin EC  81 mg Oral Daily  . carvedilol  3.125 mg Oral BID WC  . fentaNYL      .  furosemide  20 mg Oral BID  . heparin sodium (porcine)      . insulin aspart  0-15 Units Subcutaneous TID WC  . insulin aspart  0-5 Units Subcutaneous QHS  . [START ON 08/01/2019] levothyroxine  88 mcg Oral QAC breakfast  . lisinopril  5 mg Oral Daily  . midazolam      . potassium chloride SA  20 mEq Oral Daily   Infusions:  . sodium chloride 75 mL/hr at 07/31/19 0924   PRN: acetaminophen, alteplase, fentaNYL, gabapentin, heparin sodium (porcine), meclizine, midazolam, ondansetron (ZOFRAN) IV, traMADol  Assessment: Pharmacy consulted to start heparin for Ischemic Leg. No DOAC PTA. CBC stable.   Goal of Therapy:  Heparin level 0.3-0.7 units/ml Monitor platelets by anticoagulation protocol: Yes   Plan:  Patient received 4000 units  bolus in the cath lab.  Start heparin infusion at 700 units/hr Check anti-Xa level in 8 hours and daily while on heparin Continue to monitor H&H and platelets  Oswald Hillock, PharmD, BCPS 07/31/2019,12:42 PM

## 2019-07-31 NOTE — Progress Notes (Signed)
Provider ok  With the continuation of pressure and states common to have leak with heparin gtt. Will continue to monitor at this point.

## 2019-07-31 NOTE — Progress Notes (Signed)
Assumed care of patient this evening. Patient continues to have a PAD in place per earlier procedure. Site appears to have a slow leak of blood. I have tried to reinforce the pressure with a 1073ml bag as well as been placing 4x4 near the site to help absorb. I have changed the reinforcements  a total of 3 times at this point with them all being saturated with blood. Patient continues to be on  Heparin gtt at 7gtt/hr. Vascular has been contacted at this time. Safety checks completed will continue to monitor.

## 2019-07-31 NOTE — H&P (Signed)
[facility] History & Physical Update  The patient was interviewed and re-examined.  The patient's previous History and Physical has been reviewed and is unchanged.  There is no change in the plan of care. We plan to proceed with the scheduled procedure.  [name], MD  07/31/2019, 9:17 AM

## 2019-07-31 NOTE — Op Note (Signed)
[facility] Percutaneous Study/Intervention Procedural Note   Date of Surgery: 07/31/2019  Surgeon: [name]  Pre-operative Diagnosis: Atherosclerotic occlusive disease bilateral lower extremities with ischemia and rest pain of the right lower extremity  Post-operative diagnosis: Same  Procedure(s) Performed: 1. Introduction catheter into right lower extremity 3rd order catheter placement with additional third order 2. Contrast injection right lower extremity for distal runoff with additional 3rd order  3. Percutaneous transluminal angioplasty right anterior tibial artery to 3 mm              4. Percutaneous transluminal angioplasty right peroneal artery to 2.5 mm  5. Percutaneous transluminal angioplasty right popliteal artery to 4 mm with Lutonix drug-eluting balloon.             6.  Thrombectomy right SFA and popliteal using both the [place] as well as the penumbra CAT 7 device.             7.  Thrombectomy using the penumbra CAT 6 device right anterior tibial with additional right peroneal treatment.              8.  Star close closure left common femoral arteriotomy  Anesthesia: Conscious sedation was administered under my direct supervision by the interventional radiology RN. IV Versed plus fentanyl were utilized. Continuous ECG, pulse oximetry and blood pressure was monitored throughout the entire procedure.  Conscious sedation was for a total of 2 hour 17 minutes.  Sheath: 7 [place] Ansell retrograde left common femoral  Contrast: 80 cc  Fluoroscopy Time: 18.2 minutes  Indications: [name] Buck presents with increasing pain of the right lower extremity.  Patient's noninvasive studies and physical exam support critical ischemia.  This suggests the patient is having limb threatening ischemia. The risks and benefits are reviewed all questions answered patient agrees to  proceed.  Procedure:Dicy J Pareja is a 86 y.o. y.o. female who was identified and appropriate procedural time out was performed. The patient was then placed supine on the table and prepped and draped in the usual sterile fashion.   Ultrasound was placed in the sterile sleeve and the left groin was evaluated the left common femoral artery was echolucent and pulsatile indicating patency. Image was recorded for the permanent record and under real-time visualization a microneedle was inserted into the common femoral artery followed by the microwire and then the micro-sheath. A J-wire was then advanced through the micro-sheath and a 5 [place] sheath was then inserted over a J-wire. J-wire was then advanced and a 5 French pigtail catheter was positioned at the level of T12.  AP projection of the aorta was then obtained. Pigtail catheter was repositioned to above the bifurcation and a LAO view of the pelvis was obtained. Subsequently a pigtail catheter with the stiff angle Glidewire was used to cross the aortic bifurcation the catheter wire were advanced down into the right distal external iliac artery. Oblique view of the femoral bifurcation was then obtained and subsequently the wire was reintroduced and the pigtail catheter negotiated into the SFA representing third order catheter placement. Distal runoff was then performed.  Diagnostic interpretation: The abdominal aorta is opacified the bolus injection contrast.  There are no hemodynamically significant lesions noted within the aorta bilateral common or external iliac arteries.  There is diffuse disease within both internal iliac arteries with many lesions of hemodynamic significance.  Also of note there is a high-grade left renal artery stenosis.  There is no hemodynamically significant right renal artery  stenosis.  The right common femoral profunda femoris are patent and free of hemodynamically significant stenosis.  SFA occludes at its origin  previously placed stents are noted.  Below this level there is essentially nonvisualization of the tibial arteries consistent with occlusion in similar fashion to the SFA and popliteal.  4000 units of heparin was then given and allowed to circulate (an additional 2000 units was given later in the case) and a 7 [place] Ansell sheath was advanced up and over the bifurcation and positioned in the femoral artery  Kumpe catheter and advantage wire were then negotiated down into the distal popliteal. Catheter was then advanced. Hand injection contrast demonstrated the tibial anatomy in detail.  This showed thrombus within the tibioperoneal trunk and proximal anterior tibial.  There is critical stenosis greater than 90% within the anterior tibial peroneal.  The posterior tibial is occluded throughout its entire course.  The [place] device was then prepped on the field and mechanical thrombectomy was performed through the entire length of the SFA and popliteal.  Unfortunately the device became occluded with a heavy burden of thrombotic material.  I therefore elected to use 8 mg of TPA which was infused using a 50 cm infusion catheter across the SFA and popliteal.  This was allowed to dwell for 30 minutes.  I then initiated thrombectomy of the SFA and popliteal with the penumbra catheter.  Ultimately the penumbra CAT 7 was run the entire length.  Distally there was persistent thrombus in the terminal portion of the popliteal extending into the anterior tibial and the tibioperoneal trunk peroneal.  Next the wire and catheter were manipulated into the peroneal hand-injection contrast demonstrated the peroneal was patent distally.  A 2.5 mm x 10 balloon was then used to treat the peroneal and tibioperoneal trunk.  Follow-up imaging demonstrated patency now with less than 20% residual stenosis.  I then used a Kumpe catheter and the wire and negotiated into the anterior tibial where a 3 mm x 10 cm balloon was utilized and  angioplasty of the anterior tibial was performed.  Again less than 20% residual stenosis.  At this point the distal popliteal demonstrated greater than 70% stenosis elected to treat this with a 4 mm x 40 mm Lutonix drug-eluting balloon.  Following inflation for 1 minute to 12 atm hand-injection of contrast demonstrated thrombus within the distal popliteal and the anterior tibial as well as the peroneal.  The penumbra CAT 6 device was introduced and it was negotiated first of the anterior tibial where mechanical thrombectomy was performed into the peroneal.  Follow-up imaging now demonstrated patency of the SFA popliteal stents there is less than 20% residual stenosis in the distal popliteal.  There is less than 20% residual stenosis in the anterior tibial and tibioperoneal trunk peroneal.  After review of these images the sheath is pulled into the left external iliac oblique of the common femoral is obtained and a Star close device deployed. There no immediate Complications.  Findings:  The abdominal aorta is opacified the bolus injection contrast.  There are no hemodynamically significant lesions noted within the aorta bilateral common or external iliac arteries.  There is diffuse disease within both internal iliac arteries with many lesions of hemodynamic significance.  Also of note there is a high-grade left renal artery stenosis.  There is no hemodynamically significant right renal artery stenosis.  The right common femoral profunda femoris are patent and free of hemodynamically significant stenosis.  SFA occludes at its origin previously  placed stents are noted.  Below this level there is essentially nonvisualization of the tibial arteries consistent with occlusion in similar fashion to the SFA and popliteal.  Distally there was persistent thrombus in the terminal portion of the popliteal extending into the anterior tibial and the tibioperoneal trunk peroneal.  The penumbra CAT 6 device was  introduced and it was negotiated first of the anterior tibial where mechanical thrombectomy was performed into the peroneal.  Follow-up imaging now demonstrated patency of the SFA popliteal stents there is less than 20% residual stenosis in the distal popliteal.  There is less than 20% residual stenosis in the anterior tibial and tibioperoneal trunk peroneal.   Summary: Successful recanalization right lower extremity for limb salvage   Disposition: Patient was taken to the recovery room in stable condition having tolerated the procedure well.  Belenda Cruise Zannah Melucci 07/31/2019,5:15 PM

## 2019-08-01 DIAGNOSIS — I998 Other disorder of circulatory system: Secondary | ICD-10-CM

## 2019-08-01 LAB — BASIC METABOLIC PANEL
Anion gap: 8 (ref 5–15)
BUN: 47 mg/dL — ABNORMAL HIGH (ref 8–23)
CO2: 24 mmol/L (ref 22–32)
Calcium: 8.6 mg/dL — ABNORMAL LOW (ref 8.9–10.3)
Chloride: 108 mmol/L (ref 98–111)
Creatinine, Ser: 1.7 mg/dL — ABNORMAL HIGH (ref 0.44–1.00)
GFR calc Af Amer: 30 mL/min — ABNORMAL LOW (ref >60)
GFR calc non Af Amer: 26 mL/min — ABNORMAL LOW (ref >60)
Glucose, Bld: 172 mg/dL — ABNORMAL HIGH (ref 70–99)
Potassium: 3.7 mmol/L (ref 3.5–5.1)
Sodium: 140 mmol/L (ref 135–145)

## 2019-08-01 LAB — CBC
HCT: 29.4 % — ABNORMAL LOW (ref 36.0–46.0)
Hemoglobin: 9.5 g/dL — ABNORMAL LOW (ref 12.0–15.0)
MCH: 30.5 pg (ref 26.0–34.0)
MCHC: 32.3 g/dL (ref 30.0–36.0)
MCV: 94.5 fL (ref 80.0–100.0)
Platelets: 184 10*3/uL (ref 150–400)
RBC: 3.11 MIL/uL — ABNORMAL LOW (ref 3.87–5.11)
RDW: 14.2 % (ref 11.5–15.5)
WBC: 8.2 10*3/uL (ref 4.0–10.5)
nRBC: 0 % (ref 0.0–0.2)

## 2019-08-01 LAB — GLUCOSE, CAPILLARY
Glucose-Capillary: 150 mg/dL — ABNORMAL HIGH (ref 70–99)
Glucose-Capillary: 156 mg/dL — ABNORMAL HIGH (ref 70–99)

## 2019-08-01 LAB — MAGNESIUM: Magnesium: 1.8 mg/dL (ref 1.7–2.4)

## 2019-08-01 LAB — HEPARIN LEVEL (UNFRACTIONATED): Heparin Unfractionated: 0.62 IU/mL (ref 0.30–0.70)

## 2019-08-01 MED ORDER — CLOPIDOGREL BISULFATE 75 MG PO TABS
75.0000 mg | ORAL_TABLET | Freq: Every day | ORAL | Status: DC
  Administered 2019-08-01: 75 mg via ORAL
  Filled 2019-08-01: qty 1

## 2019-08-01 MED ORDER — ASPIRIN 81 MG PO TBEC: 81.0000 mg | DELAYED_RELEASE_TABLET | Freq: Every day | ORAL | Status: DC

## 2019-08-01 MED ORDER — TRAMADOL HCL 50 MG PO TABS: 50.0000 mg | ORAL_TABLET | Freq: Three times a day (TID) | ORAL | 0 refills | Status: DC | PRN

## 2019-08-01 NOTE — Consult Note (Signed)
[name] for Heparin Indication: Ischemic Leg  Allergies  Allergen Reactions  . Sulfa Antibiotics Hives    Patient Measurements: Height: 5\' 3"  (160 cm) Weight: 53.5 kg (118 lb) IBW/kg (Calculated) : 52.4 Heparin Dosing Weight: 53.5 kg  Vital Signs: Temp: 98.1 F (36.7 C) (04/20 2202) Temp Source: Oral (04/20 2202) BP: 127/64 (04/20 2202) Pulse Rate: 96 (04/20 2202)  Labs: Recent Labs    07/31/19 0914 07/31/19 2210  HEPARINUNFRC  --  0.42  CREATININE 1.82*  --     Estimated Creatinine Clearance: 16.7 mL/min (A) (by C-G formula based on SCr of 1.82 mg/dL (H)).   Medical History: Past Medical History:  Diagnosis Date  . A-fib ([place])   . CHF (congestive heart failure) ([place])   . Diabetes mellitus without complication ([place])   . Hypertension     Medications:  Medications Prior to Admission  Medication Sig Dispense Refill Last Dose  . ACCU-CHEK AVIVA PLUS test strip    07/31/2019 at Unknown time  . acetaminophen (TYLENOL) 325 MG tablet Take 325-650 mg by mouth every 6 (six) hours as needed for headache.    Past Month at Unknown time  . allopurinol (ZYLOPRIM) 100 MG tablet Take 100 mg by mouth daily.   07/31/2019 at Unknown time  . carvedilol (COREG) 3.125 MG tablet Take 3.125 mg by mouth 2 (two) times daily with a meal.    07/31/2019 at Unknown time  . collagenase (SANTYL) ointment Santyl 250 unit/gram topical ointment   Past Week at Unknown time  . furosemide (LASIX) 20 MG tablet Take 20 mg by mouth 2 (two) times daily.    07/31/2019 at Unknown time  . gabapentin (NEURONTIN) 100 MG capsule Take 100-200 mg by mouth at bedtime as needed (pain).    07/30/2019 at Unknown time  . glucose blood (ACCU-CHEK AVIVA PLUS) test strip CHECK BLOOD SUGAR 3 TIMES DAILY   07/31/2019 at Unknown time  . HUMALOG KWIKPEN 100 UNIT/ML KiwkPen Inject 3 Units into the skin 3 (three) times daily after meals. Per sliding scale   07/31/2019 at Unknown time  .  LEVEMIR FLEXTOUCH 100 UNIT/ML Pen Inject 8 Units into the skin at bedtime.    07/30/2019 at Unknown time  . levothyroxine (SYNTHROID, LEVOTHROID) 88 MCG tablet Take 88 mcg by mouth daily before breakfast.    07/31/2019 at Unknown time  . lisinopril (ZESTRIL) 5 MG tablet Take 5 mg by mouth daily.    07/31/2019 at Unknown time  . lovastatin (MEVACOR) 20 MG tablet Take 20 mg by mouth daily with supper.    07/30/2019 at Unknown time  . meclizine (ANTIVERT) 25 MG tablet Take 25 mg by mouth 3 (three) times daily as needed for dizziness.    Past Month at Unknown time  . Multiple Vitamins-Minerals (PRESERVISION AREDS 2 PO) Take 1 tablet by mouth every evening.   07/31/2019 at Unknown time  . potassium chloride SA (K-DUR,KLOR-CON) 20 MEQ tablet Take 20 mEq by mouth daily.    07/31/2019 at Unknown time  . Vitamin D3 (VITAMIN D) 25 MCG tablet Take 2,000 Units by mouth daily.   07/31/2019 at Unknown time  . clopidogrel (PLAVIX) 75 MG tablet Take 1 tablet (75 mg total) by mouth daily. (Patient not taking: Reported on 07/30/2019) 30 tablet 11 Not Taking at Unknown time   Scheduled:  . allopurinol  100 mg Oral Daily  . aspirin EC  81 mg Oral Daily  . carvedilol  3.125 mg Oral BID  WC  . furosemide  20 mg Oral BID  . insulin aspart  0-15 Units Subcutaneous TID WC  . insulin aspart  0-5 Units Subcutaneous QHS  . levothyroxine  88 mcg Oral Q0600  . lisinopril  5 mg Oral Daily  . potassium chloride SA  20 mEq Oral Daily   Infusions:  . heparin 700 Units/hr (07/31/19 1325)   PRN: acetaminophen, gabapentin, meclizine, ondansetron (ZOFRAN) IV, traMADol  Assessment: Pharmacy consulted to start heparin for Ischemic Leg. No DOAC PTA. CBC stable.   Goal of Therapy:  Heparin level 0.3-0.7 units/ml Monitor platelets by anticoagulation protocol: Yes   Plan:  04/20 @ 2200 HL 0.42 therapeutic. Will continue current rate and will recheck HL at 0600 and continue to monitor.  Tobie Lords, PharmD, BCPS 08/01/2019,12:02  AM

## 2019-08-01 NOTE — Progress Notes (Signed)
Patients PAD continues to have a slow leak throughout the evening. The PAD continued to be reinforced with pressure of a 1058ml IV bag and 4x4 placed to aid in the blood leakage. 4x4's have been several times this evening and early AM. Patient did have an episode of nausea this AM and given Zofran with good effect. Seems to have also had increase in right heel pain given Tramadol this AM as well. Will continue to monitor.

## 2019-08-01 NOTE — Consult Note (Signed)
[place] for Heparin Indication: Ischemic Leg  Allergies  Allergen Reactions  . Sulfa Antibiotics Hives    Patient Measurements: Height: 5\' 3"  (160 cm) Weight: 53.5 kg (118 lb) IBW/kg (Calculated) : 52.4 Heparin Dosing Weight: 53.5 kg  Vital Signs: Temp: 98.6 F (37 C) (04/21 0422) Temp Source: Oral (04/21 0422) BP: 115/46 (04/21 0422) Pulse Rate: 59 (04/21 0422)  Labs: Recent Labs    07/31/19 0914 07/31/19 2210 08/01/19 0611  HGB  --   --  9.5*  HCT  --   --  29.4*  PLT  --   --  184  HEPARINUNFRC  --  0.42 0.62  CREATININE 1.82*  --  1.70*    Estimated Creatinine Clearance: 17.8 mL/min (A) (by C-G formula based on SCr of 1.7 mg/dL (H)).   Medical History: Past Medical History:  Diagnosis Date  . A-fib ([place])   . CHF (congestive heart failure) ([place])   . Diabetes mellitus without complication ([place])   . Hypertension     Medications:  Medications Prior to Admission  Medication Sig Dispense Refill Last Dose  . ACCU-CHEK AVIVA PLUS test strip    07/31/2019 at Unknown time  . acetaminophen (TYLENOL) 325 MG tablet Take 325-650 mg by mouth every 6 (six) hours as needed for headache.    Past Month at Unknown time  . allopurinol (ZYLOPRIM) 100 MG tablet Take 100 mg by mouth daily.   07/31/2019 at Unknown time  . carvedilol (COREG) 3.125 MG tablet Take 3.125 mg by mouth 2 (two) times daily with a meal.    07/31/2019 at Unknown time  . collagenase (SANTYL) ointment Santyl 250 unit/gram topical ointment   Past Week at Unknown time  . furosemide (LASIX) 20 MG tablet Take 20 mg by mouth 2 (two) times daily.    07/31/2019 at Unknown time  . gabapentin (NEURONTIN) 100 MG capsule Take 100-200 mg by mouth at bedtime as needed (pain).    07/30/2019 at Unknown time  . glucose blood (ACCU-CHEK AVIVA PLUS) test strip CHECK BLOOD SUGAR 3 TIMES DAILY   07/31/2019 at Unknown time  . HUMALOG KWIKPEN 100 UNIT/ML KiwkPen Inject 3 Units into the skin 3  (three) times daily after meals. Per sliding scale   07/31/2019 at Unknown time  . LEVEMIR FLEXTOUCH 100 UNIT/ML Pen Inject 8 Units into the skin at bedtime.    07/30/2019 at Unknown time  . levothyroxine (SYNTHROID, LEVOTHROID) 88 MCG tablet Take 88 mcg by mouth daily before breakfast.    07/31/2019 at Unknown time  . lisinopril (ZESTRIL) 5 MG tablet Take 5 mg by mouth daily.    07/31/2019 at Unknown time  . lovastatin (MEVACOR) 20 MG tablet Take 20 mg by mouth daily with supper.    07/30/2019 at Unknown time  . meclizine (ANTIVERT) 25 MG tablet Take 25 mg by mouth 3 (three) times daily as needed for dizziness.    Past Month at Unknown time  . Multiple Vitamins-Minerals (PRESERVISION AREDS 2 PO) Take 1 tablet by mouth every evening.   07/31/2019 at Unknown time  . potassium chloride SA (K-DUR,KLOR-CON) 20 MEQ tablet Take 20 mEq by mouth daily.    07/31/2019 at Unknown time  . Vitamin D3 (VITAMIN D) 25 MCG tablet Take 2,000 Units by mouth daily.   07/31/2019 at Unknown time  . clopidogrel (PLAVIX) 75 MG tablet Take 1 tablet (75 mg total) by mouth daily. (Patient not taking: Reported on 07/30/2019) 30 tablet 11 Not Taking  at Unknown time   Scheduled:  . allopurinol  100 mg Oral Daily  . aspirin EC  81 mg Oral Daily  . carvedilol  3.125 mg Oral BID WC  . furosemide  20 mg Oral BID  . insulin aspart  0-15 Units Subcutaneous TID WC  . insulin aspart  0-5 Units Subcutaneous QHS  . levothyroxine  88 mcg Oral Q0600  . lisinopril  5 mg Oral Daily  . potassium chloride SA  20 mEq Oral Daily   Infusions:  . heparin 700 Units/hr (07/31/19 1325)   PRN: acetaminophen, gabapentin, meclizine, ondansetron (ZOFRAN) IV, traMADol  Assessment: Pharmacy consulted to start heparin for Ischemic Leg. No DOAC PTA. CBC stable.   Goal of Therapy:  Heparin level 0.3-0.7 units/ml Monitor platelets by anticoagulation protocol: Yes   Plan:  04/21 @ 0600 HL 0.62 therapeutic. Will continue current rate and will recheck HL  w/ am labs and continue to monitor.  Tobie Lords, PharmD, BCPS 08/01/2019,7:15 AM

## 2019-08-01 NOTE — Discharge Summary (Signed)
[facility]    Discharge Summary  Patient ID:  [name] MRN: [id number] DOB/AGE: Dec 31, 1925 86 y.o.  Admit date: 07/31/2019 Discharge date: 08/01/2019 Date of Surgery: 07/31/2019 Surgeon: Surgeon(s): Schnier, Dolores Lory, MD  Admission Diagnosis: Ischemia of extremity [I99.8]  Discharge Diagnoses:  Ischemia of extremity [I99.8]  Secondary Diagnoses: Past Medical History:  Diagnosis Date  . A-fib ([place])   . CHF (congestive heart failure) ([place])   . Diabetes mellitus without complication ([place])   . Hypertension    Procedure(s): 07/31/19: 1. Introduction catheter into right lower extremity 3rd order catheter placement with additional third order 2. Contrast injection right lower extremity for distal runoff with additional 3rd order  3. Percutaneous transluminal angioplasty right anterior tibial artery to 3 mm              4. Percutaneous transluminal angioplasty right peroneal artery to 2.5 mm  5. Percutaneous transluminal angioplasty right popliteal artery to 4 mm with Lutonix drug-eluting balloon.             6.  Thrombectomy right SFA and popliteal using both the [place] as well as the penumbra CAT 7 device.             7.  Thrombectomy using the penumbra CAT 6 device right anterior tibial with additional right peroneal treatment.              8.  Star close closure left common femoral arteriotomy  Discharged Condition: Good  HPI / Hospital Course:  The patient is a 86 year old female with known history of peripheral artery disease requiring endovascular intervention.  On July 30, 2019, the patient presented to her outpatient office with significant deterioration in the lower extremity symptoms.  The patient notes interval shortening of their claudication distance and development of mild rest pain symptoms. No new ulcers or wounds have occurred since the last visit.  The patient stated that  several days ago she had 2 extremely terrible cramps in her right lower extremity in the mid thigh area.  After that she began to suffer the rest pain symptoms in her right foot.  ABI's Rt=0.34 and Lt=[place] (previous ABI's Rt= 1.28 and Lt= [place])  On 07/31/19, the patient underwent, 1. Introduction catheter into right lower extremity 3rd order catheter placement with additional third order 2. Contrast injection right lower extremity for distal runoff with additional 3rd order  3. Percutaneous transluminal angioplasty right anterior tibial artery to 3 mm              4. Percutaneous transluminal angioplasty right peroneal artery to 2.5 mm  5. Percutaneous transluminal angioplasty right popliteal artery to 4 mm with Lutonix drug-eluting balloon.             6.  Thrombectomy right SFA and popliteal using both the [place] as well as the penumbra CAT 7 device.             7.  Thrombectomy using the penumbra CAT 6 device right anterior tibial with additional right peroneal treatment.              8.  Star close closure left common femoral arteriotomy  She tolerated the procedure well and was transferred from the radiology suite to the surgical floor without issue.  The patient was treated with heparin drip overnight and transition to aspirin and Plavix the following day.  The patient's brief inpatient stay was unremarkable.  Patient's night of procedure was unremarkable.  Upon discharge, the patient was tolerating a regular diet, she was urinating independently, her pain was controlled with use of p.o. pain medication she was ambulating at baseline.  At discharge, the patient was afebrile with stable vital signs.  Physical Exam:  Alert and oriented x3, NAD Cardiovascular: Regular rate and rhythm Pulmonary: Clear to auscultation bilaterally Abdomen: Soft, nontender, nondistended, (+) bowel sounds Left groin:  Access site: Clean dry intact.  Some  ecchymosis noted to the medial aspect of the thigh.  No swelling or drainage noted. Right lower extremity: Thigh soft.  Calf soft.  Extremities warm distally toes.  Motor/sensory is intact.  Minimal edema.  No compartment syndrome noted.  Labs: As below  Complications: None  Consults: None  Significant Diagnostic Studies: CBC Lab Results  Component Value Date   WBC 8.2 08/01/2019   HGB 9.5 (L) 08/01/2019   HCT 29.4 (L) 08/01/2019   MCV 94.5 08/01/2019   PLT 184 08/01/2019   BMET    Component Value Date/Time   NA 140 08/01/2019 0611   NA 141 08/03/2011 0729   K 3.7 08/01/2019 0611   K 3.6 08/03/2011 0729   CL 108 08/01/2019 0611   CL 105 08/03/2011 0729   CO2 24 08/01/2019 0611   CO2 26 08/03/2011 0729   GLUCOSE 172 (H) 08/01/2019 0611   GLUCOSE 149 (H) 08/03/2011 0729   BUN 47 (H) 08/01/2019 0611   BUN 16 08/03/2011 0729   CREATININE 1.70 (H) 08/01/2019 0611   CREATININE 1.07 08/03/2011 0729   CALCIUM 8.6 (L) 08/01/2019 0611   CALCIUM 9.5 08/03/2011 0729   GFRNONAA 26 (L) 08/01/2019 0611   GFRNONAA 48 (L) 08/03/2011 0729   GFRAA 30 (L) 08/01/2019 0611   GFRAA 56 (L) 08/03/2011 0729   COAG Lab Results  Component Value Date   INR 1.1 03/01/2019   INR 1.13 08/12/2017   INR 2.24 05/30/2017   Disposition:  Discharge to :Home  Allergies as of 08/01/2019      Reactions   Sulfa Antibiotics Hives      Medication List    TAKE these medications   Accu-Chek Aviva Plus test strip Generic drug: glucose blood CHECK BLOOD SUGAR 3 TIMES DAILY   Accu-Chek Aviva Plus test strip Generic drug: glucose blood   acetaminophen 325 MG tablet Commonly known as: TYLENOL Take 325-650 mg by mouth every 6 (six) hours as needed for headache.   allopurinol 100 MG tablet Commonly known as: ZYLOPRIM Take 100 mg by mouth daily.   aspirin 81 MG EC tablet Take 1 tablet (81 mg total) by mouth daily. Start taking on: August 02, 2019   carvedilol 3.125 MG tablet Commonly known  as: COREG Take 3.125 mg by mouth 2 (two) times daily with a meal.   clopidogrel 75 MG tablet Commonly known as: Plavix Take 1 tablet (75 mg total) by mouth daily.   furosemide 20 MG tablet Commonly known as: LASIX Take 20 mg by mouth 2 (two) times daily.   gabapentin 100 MG capsule Commonly known as: NEURONTIN Take 100-200 mg by mouth at bedtime as needed (pain).   HumaLOG KwikPen 100 UNIT/ML KwikPen Generic drug: insulin lispro Inject 3 Units into the skin 3 (three) times daily after meals. Per sliding scale   Levemir FlexTouch 100 UNIT/ML FlexPen Generic drug: insulin detemir Inject 8 Units into the skin at bedtime.   levothyroxine 88 MCG tablet Commonly known as: SYNTHROID Take 88 mcg by mouth daily before breakfast.   lisinopril 5 MG  tablet Commonly known as: ZESTRIL Take 5 mg by mouth daily.   lovastatin 20 MG tablet Commonly known as: MEVACOR Take 20 mg by mouth daily with supper.   meclizine 25 MG tablet Commonly known as: ANTIVERT Take 25 mg by mouth 3 (three) times daily as needed for dizziness.   potassium chloride SA 20 MEQ tablet Commonly known as: KLOR-CON Take 20 mEq by mouth daily.   PRESERVISION AREDS 2 PO Take 1 tablet by mouth every evening.   Santyl ointment Generic drug: collagenase Santyl 250 unit/gram topical ointment   traMADol 50 MG tablet Commonly known as: ULTRAM Take 1 tablet (50 mg total) by mouth every 8 (eight) hours as needed for moderate pain or severe pain.   Vitamin D3 25 MCG tablet Commonly known as: Vitamin D Take 2,000 Units by mouth daily.      Verbal and written Discharge instructions given to the patient. Wound care per Discharge AVS Follow-up Information    Schnier, Dolores Lory, MD Follow up in 2 week(s).   Specialties: Vascular Surgery, Cardiology, Radiology, Vascular Surgery Why: To see Schnier. Will need ABI and RLE arterial duplex with visit.  Contact information: [place]  [phone]          Signed: Sela Hua, PA-C  08/01/2019, 1:39 PM

## 2019-08-01 NOTE — Progress Notes (Signed)
Patient wound in left groin continues to leak. MD notified. Verbal order given to discontinue IV heparin. Dressed wound with gauze, abd pad and pressure. Will continue to monitor.

## 2019-08-22 ENCOUNTER — Other Ambulatory Visit (INDEPENDENT_AMBULATORY_CARE_PROVIDER_SITE_OTHER): Payer: Self-pay | Admitting: Vascular Surgery

## 2019-08-22 DIAGNOSIS — I70221 Atherosclerosis of native arteries of extremities with rest pain, right leg: Secondary | ICD-10-CM

## 2019-08-22 DIAGNOSIS — Z95820 Peripheral vascular angioplasty status with implants and grafts: Secondary | ICD-10-CM

## 2019-08-23 ENCOUNTER — Encounter (INDEPENDENT_AMBULATORY_CARE_PROVIDER_SITE_OTHER): Payer: Self-pay | Admitting: Vascular Surgery

## 2019-08-23 ENCOUNTER — Ambulatory Visit (INDEPENDENT_AMBULATORY_CARE_PROVIDER_SITE_OTHER): Payer: Medicare Other

## 2019-08-23 ENCOUNTER — Other Ambulatory Visit: Payer: Self-pay

## 2019-08-23 ENCOUNTER — Ambulatory Visit (INDEPENDENT_AMBULATORY_CARE_PROVIDER_SITE_OTHER): Payer: Medicare Other | Admitting: Vascular Surgery

## 2019-08-23 VITALS — BP 154/66 | HR 59 | Ht 63.0 in | Wt 119.0 lb

## 2019-08-23 DIAGNOSIS — I70221 Atherosclerosis of native arteries of extremities with rest pain, right leg: Secondary | ICD-10-CM

## 2019-08-23 DIAGNOSIS — I7025 Atherosclerosis of native arteries of other extremities with ulceration: Secondary | ICD-10-CM | POA: Diagnosis not present

## 2019-08-23 DIAGNOSIS — Z95820 Peripheral vascular angioplasty status with implants and grafts: Secondary | ICD-10-CM

## 2019-08-23 DIAGNOSIS — E782 Mixed hyperlipidemia: Secondary | ICD-10-CM | POA: Diagnosis not present

## 2019-08-23 DIAGNOSIS — I4819 Other persistent atrial fibrillation: Secondary | ICD-10-CM | POA: Diagnosis not present

## 2019-08-23 DIAGNOSIS — I10 Essential (primary) hypertension: Secondary | ICD-10-CM

## 2019-08-23 DIAGNOSIS — E1151 Type 2 diabetes mellitus with diabetic peripheral angiopathy without gangrene: Secondary | ICD-10-CM

## 2019-08-23 DIAGNOSIS — Z794 Long term (current) use of insulin: Secondary | ICD-10-CM

## 2019-08-23 NOTE — Progress Notes (Signed)
MRN : [id number]  [name] is a 86 y.o. (03/30/1926) female who presents with chief complaint of  Chief Complaint  Patient presents with  . Follow-up    2 week [facility] post LE Angio RLE U/S  .  History of Present Illness:   The patient returns to the office for followup and review status post angiogram with intervention on 07/31/2019.  1.  Percutaneous transluminal angioplasty right anterior tibial artery to 3 mm   2. Percutaneous transluminal angioplasty right peroneal artery to 2.5 mm 3. Percutaneous transluminal angioplasty right popliteal artery to 4 mm with Lutonix drug-eluting balloon. 4.  Thrombectomy right SFA and popliteal using both the [place] as well as the penumbra CAT 7 device. 5. Thrombectomy using the penumbra CAT 6 device right anterior tibial with additional right peroneal treatment.   The patient notes improvement in the lower extremity symptoms.  She saw Dr. Vickki Muff yesterday and he is pleased with the healing of her right foot ulcer.   No interval shortening of the patient's claudication distance or rest pain symptoms. No new ulcers or wounds have occurred since the last visit.  There have been no significant changes to the patient's overall health care.  The patient denies amaurosis fugax or recent TIA symptoms. There are no recent neurological changes noted. The patient denies history of DVT, PE or superficial thrombophlebitis. The patient denies recent episodes of angina or shortness of breath.   ABI's Rt=1.17 and Lt=[place]  (previous ABI's Rt=0.34 and Lt=[place]) Duplex US of the Rt lower extremity arterial system shows SFA stent is widely patent  Current Meds  Medication Sig  . ACCU-CHEK AVIVA PLUS test strip   . acetaminophen (TYLENOL) 325 MG tablet Take 325-650 mg by mouth every 6 (six) hours as needed for headache.   . allopurinol (ZYLOPRIM) 100 MG tablet Take 100 mg by mouth daily.  Marland Kitchen aspirin EC 81 MG EC tablet Take 1 tablet (81 mg total) by  mouth daily.  . carvedilol (COREG) 3.125 MG tablet Take 3.125 mg by mouth 2 (two) times daily with a meal.   . clopidogrel (PLAVIX) 75 MG tablet Take 1 tablet (75 mg total) by mouth daily.  . furosemide (LASIX) 20 MG tablet Take 20 mg by mouth 2 (two) times daily.   Marland Kitchen gabapentin (NEURONTIN) 100 MG capsule Take 100-200 mg by mouth at bedtime as needed (pain).   Marland Kitchen glucose blood (ACCU-CHEK AVIVA PLUS) test strip CHECK BLOOD SUGAR 3 TIMES DAILY  . HUMALOG KWIKPEN 100 UNIT/ML KiwkPen Inject 3 Units into the skin 3 (three) times daily after meals. Per sliding scale  . LEVEMIR FLEXTOUCH 100 UNIT/ML Pen Inject 8 Units into the skin at bedtime.   Marland Kitchen levothyroxine (SYNTHROID, LEVOTHROID) 88 MCG tablet Take 88 mcg by mouth daily before breakfast.   . lisinopril (ZESTRIL) 5 MG tablet Take 5 mg by mouth daily.   Marland Kitchen lovastatin (MEVACOR) 20 MG tablet Take 20 mg by mouth daily with supper.   . meclizine (ANTIVERT) 25 MG tablet Take 25 mg by mouth 3 (three) times daily as needed for dizziness.   . Multiple Vitamins-Minerals (PRESERVISION AREDS 2 PO) Take 1 tablet by mouth every evening.  . potassium chloride SA (K-DUR,KLOR-CON) 20 MEQ tablet Take 20 mEq by mouth daily.     Past Medical History:  Diagnosis Date  . A-fib ([place])   . CHF (congestive heart failure) ([place])   . Diabetes mellitus without complication ([place])   . Hypertension  Past Surgical History:  Procedure Laterality Date  . CHOLECYSTECTOMY    . LOWER EXTREMITY ANGIOGRAPHY Right 08/12/2017   Procedure: LOWER EXTREMITY ANGIOGRAPHY;  Surgeon: Katha Cabal, MD;  Location: [facility];  Service: Cardiovascular;  Laterality: Right;  . LOWER EXTREMITY ANGIOGRAPHY Right 06/13/2018   Procedure: LOWER EXTREMITY ANGIOGRAPHY;  Surgeon: Katha Cabal, MD;  Location: [facility];  Service: Cardiovascular;  Laterality: Right;  . LOWER EXTREMITY ANGIOGRAPHY Right 03/01/2019   Procedure: LOWER EXTREMITY ANGIOGRAPHY;  Surgeon:  Katha Cabal, MD;  Location: [facility];  Service: Cardiovascular;  Laterality: Right;  . LOWER EXTREMITY ANGIOGRAPHY Right 07/31/2019   Procedure: LOWER EXTREMITY ANGIOGRAPHY;  Surgeon: Katha Cabal, MD;  Location: [facility];  Service: Cardiovascular;  Laterality: Right;    Social History Social History   Tobacco Use  . Smoking status: Never Smoker  . Smokeless tobacco: Never Used  Substance Use Topics  . Alcohol use: No  . Drug use: No    Family History Family History  Problem Relation Age of Onset  . Cancer Brother     Allergies  Allergen Reactions  . Sulfa Antibiotics Hives     REVIEW OF SYSTEMS (Negative unless checked)  Constitutional: [] Weight loss  [] Fever  [] Chills Cardiac: [] Chest pain   [] Chest pressure   [] Palpitations   [] Shortness of breath when laying flat   [] Shortness of breath with exertion. Vascular:  [] Pain in legs with walking   [] Pain in legs at rest  [] History of DVT   [] Phlebitis   [] Swelling in legs   [] Varicose veins   [x] Non-healing ulcers Pulmonary:   [] Uses home oxygen   [] Productive cough   [] Hemoptysis   [] Wheeze  [] COPD   [] Asthma Neurologic:  [] Dizziness   [] Seizures   [] History of stroke   [] History of TIA  [] Aphasia   [] Vissual changes   [] Weakness or numbness in arm   [] Weakness or numbness in leg Musculoskeletal:   [] Joint swelling   [] Joint pain   [] Low back pain Hematologic:  [] Easy bruising  [] Easy bleeding   [] Hypercoagulable state   [] Anemic Gastrointestinal:  [] Diarrhea   [] Vomiting  [] Gastroesophageal reflux/heartburn   [] Difficulty swallowing. Genitourinary:  [] Chronic kidney disease   [] Difficult urination  [] Frequent urination   [] Blood in urine Skin:  [] Rashes   [] Ulcers  Psychological:  [] History of anxiety   []  History of major depression.  Physical Examination  Vitals:   08/23/19 1431  BP: (!) 154/66  Pulse: (!) 59  Weight: 119 lb (54 kg)  Height: 5\' 3"  (1.6 m)   Body mass index is  21.08 kg/m. Gen: WD/WN, NAD Head: [place]/AT, No temporalis wasting.  Ear/Nose/Throat: Hearing grossly intact, nares w/o erythema or drainage Eyes: PER, EOMI, sclera nonicteric.  Neck: Supple, no large masses.   Pulmonary:  Good air movement, no audible wheezing bilaterally, no use of accessory muscles.  Cardiac: RRR, no JVD Vascular:  Right heel dressed no edema no erythremia Vessel Right Left  Radial Palpable Palpable  PT Palpable Palpable  DP Palpable Palpable  Gastrointestinal: Non-distended. No guarding/no peritoneal signs.  Musculoskeletal: M/S 5/5 throughout.  No deformity or atrophy.  Neurologic: CN 2-12 intact. Symmetrical.  Speech is fluent. Motor exam as listed above. Psychiatric: Judgment intact, Mood & affect appropriate for pt's clinical situation. Dermatologic: No rashes or ulcers noted.  No changes consistent with cellulitis.  CBC Lab Results  Component Value Date   WBC 8.2 08/01/2019   HGB 9.5 (L)  08/01/2019   HCT 29.4 (L) 08/01/2019   MCV 94.5 08/01/2019   PLT 184 08/01/2019    BMET    Component Value Date/Time   NA 140 08/01/2019 0611   NA 141 08/03/2011 0729   K 3.7 08/01/2019 0611   K 3.6 08/03/2011 0729   CL 108 08/01/2019 0611   CL 105 08/03/2011 0729   CO2 24 08/01/2019 0611   CO2 26 08/03/2011 0729   GLUCOSE 172 (H) 08/01/2019 0611   GLUCOSE 149 (H) 08/03/2011 0729   BUN 47 (H) 08/01/2019 0611   BUN 16 08/03/2011 0729   CREATININE 1.70 (H) 08/01/2019 0611   CREATININE 1.07 08/03/2011 0729   CALCIUM 8.6 (L) 08/01/2019 0611   CALCIUM 9.5 08/03/2011 0729   GFRNONAA 26 (L) 08/01/2019 0611   GFRNONAA 48 (L) 08/03/2011 0729   GFRAA 30 (L) 08/01/2019 0611   GFRAA 56 (L) 08/03/2011 0729   CrCl cannot be calculated (Patient's most recent lab result is older than the maximum 21 days allowed.).  COAG Lab Results  Component Value Date   INR 1.1 03/01/2019   INR 1.13 08/12/2017   INR 2.24 05/30/2017    Radiology PERIPHERAL VASCULAR  CATHETERIZATION  Result Date: 07/31/2019 See op note  VAS [place] ABI WITH/WO TBI  Result Date: 08/02/2019 LOWER EXTREMITY DOPPLER STUDY Indications: Peripheral artery disease, and Right foot pain.  Vascular Interventions: 06/13/2018 PTA and stent right SFA and popliteal. PTA                         right ATA. PTA right peroneal artery. Comparison Study: 06/29/2018 Performing Technologist: Charlane Ferretti RT (R)(VS)  Examination Guidelines: A complete evaluation includes at minimum, Doppler waveform signals and systolic blood pressure reading at the level of bilateral brachial, anterior tibial, and posterior tibial arteries, when vessel segments are accessible. Bilateral testing is considered an integral part of a complete examination. Photoelectric Plethysmograph (PPG) waveforms and toe systolic pressure readings are included as required and additional duplex testing as needed. Limited examinations for reoccurring indications may be performed as noted.  ABI Findings: +---------+------------------+-----+----------+--------+ Right    Rt Pressure (mmHg)IndexWaveform  Comment  +---------+------------------+-----+----------+--------+ Brachial 163                                       +---------+------------------+-----+----------+--------+ ATA      63                0.34 monophasic         +---------+------------------+-----+----------+--------+ PTA      27                0.15 monophasic         +---------+------------------+-----+----------+--------+ Great Toe0                 0.00                    +---------+------------------+-----+----------+--------+ +---------+------------------+-----+----------+----------------+ Left     Lt Pressure (mmHg)IndexWaveform  Comment          +---------+------------------+-----+----------+----------------+ Brachial 185                                               +---------+------------------+-----+----------+----------------+ ATA  monophasicNon compressable +---------+------------------+-----+----------+----------------+ PTA                             monophasicNon compressable +---------+------------------+-----+----------+----------------+ Great Toe63                0.34 Abnormal                   +---------+------------------+-----+----------+----------------+ +-------+-----------+-----------+------------+------------+ ABI/TBIToday's ABIToday's TBIPrevious ABIPrevious TBI +-------+-----------+-----------+------------+------------+ Right  .34        0.0        1.28        .51          +-------+-----------+-----------+------------+------------+ Left   [place]         .34        [place]          .67          +-------+-----------+-----------+------------+------------+ Right ABIs appear decreased compared to prior study on 06/29/2018. Left ABIs appear essentially unchanged compared to prior study on 06/29/2018.  Summary: Right: Resting right ankle-brachial index indicates severe right lower extremity arterial disease. The right toe-brachial index is abnormal. Right TBI appears to have no flow detected as compared to the previous exam on 06/29/2018. Left: Resting left ankle-brachial index indicates noncompressible left lower extremity arteries. The left toe-brachial index is abnormal. ABIs are unreliable. Left TBI appears decreased from the previous exam on 06/29/2018. *See table(s) above for measurements and observations.  Electronically signed by [name] MD on 08/02/2019 at 9:07:30 AM.    Final    VAS [place] LOWER EXTREMITY ARTERIAL DUPLEX  Result Date: 08/02/2019 LOWER EXTREMITY ARTERIAL DUPLEX STUDY Indications: Peripheral artery disease.  Vascular Interventions: H/O left popliteal artery stent; Rt PTA and SFA to Pop                         stent5/06/2017                         06/13/2018 PTA and stent right SFA and popliteal. PTA                         right ATA. PTA right peroneal A. Current ABI:             Right = .34 Left = [place] Comparison Study: 04/30/2019 Performing Technologist: Charlane Ferretti RT (R)(VS)  Examination Guidelines: A complete evaluation includes B-mode imaging, spectral Doppler, color Doppler, and power Doppler as needed of all accessible portions of each vessel. Bilateral testing is considered an integral part of a complete examination. Limited examinations for reoccurring indications may be performed as noted.  +-----------+--------+-----+--------+----------+----------------+ RIGHT      PSV cm/sRatioStenosisWaveform  Comments         +-----------+--------+-----+--------+----------+----------------+ CFA Distal 89                   biphasic                   +-----------+--------+-----+--------+----------+----------------+ DFA        151                  biphasic                   +-----------+--------+-----+--------+----------+----------------+ SFA Prox                occluded  Stent            +-----------+--------+-----+--------+----------+----------------+ SFA Mid                 occluded          Stent Collateral +-----------+--------+-----+--------+----------+----------------+ SFA Distal              occluded          Stent Collateral +-----------+--------+-----+--------+----------+----------------+ POP Prox                occluded                           +-----------+--------+-----+--------+----------+----------------+ POP Distal 9            occluded                           +-----------+--------+-----+--------+----------+----------------+ ATA Distal 9                    monophasic                 +-----------+--------+-----+--------+----------+----------------+ PTA Distal 14                   monophasic                 +-----------+--------+-----+--------+----------+----------------+ PERO Distal                               Not visualized    +-----------+--------+-----+--------+----------+----------------+  Summary: Right: Total occlusion noted in the superficial femoral artery. Total occlusion noted in the superficial femoral artery and/or popliteal artery. Total occlusion noted in the popliteal artery. Right SFA stent is occluded. See table(s) above for measurements and observations. Electronically signed by [name] MD on 08/02/2019 at 9:07:40 AM.    Final       Assessment/Plan 1. Atherosclerosis of native arteries of the extremities with ulceration ([place]) Recommend:  The patient is status post successful angiogram with intervention.  The patient reports that the claudication symptoms and leg pain is essentially gone.   The patient denies lifestyle limiting changes at this point in time.  No further invasive studies, angiography or surgery at this time The patient should continue walking and begin a more formal exercise program.  The patient should continue antiplatelet therapy and aggressive treatment of the lipid abnormalities  Smoking cessation was again discussed  The patient should continue wearing graduated compression socks 10-15 mmHg strength to control the mild edema.  Patient should undergo noninvasive studies as ordered. The patient will follow up with me after the studies.   - VAS [place] ABI WITH/WO TBI; Future  2. Essential hypertension Continue antihypertensive medications as already ordered, these medications have been reviewed and there are no changes at this time.   3. Persistent atrial fibrillation (HCC) Continue antiarrhythmia medications as already ordered, these medications have been reviewed and there are no changes at this time.  Continue anticoagulation as ordered by Cardiology Service   4. Mixed hyperlipidemia Continue statin as ordered and reviewed, no changes at this time   5. Type 2 diabetes mellitus with diabetic peripheral angiopathy without gangrene, with long-term current use of  insulin (HCC) Continue hypoglycemic medications as already ordered, these medications have been reviewed and there are no changes at this time.  Hgb A1C to be monitored as already arranged by primary service  [name], MD  08/23/2019 2:47 PM

## 2019-10-03 ENCOUNTER — Other Ambulatory Visit (INDEPENDENT_AMBULATORY_CARE_PROVIDER_SITE_OTHER): Payer: Self-pay | Admitting: Vascular Surgery

## 2019-10-03 ENCOUNTER — Encounter: Payer: Self-pay | Admitting: Vascular Surgery

## 2019-10-03 ENCOUNTER — Inpatient Hospital Stay
Admission: EM | Admit: 2019-10-03 | Discharge: 2019-10-25 | DRG: 270 | Disposition: A | Discharge status: Hospice | Payer: Medicare Other | Source: Ambulatory Visit | Attending: Vascular Surgery | Admitting: Vascular Surgery

## 2019-10-03 ENCOUNTER — Other Ambulatory Visit
Admission: AD | Admit: 2019-10-03 | Discharge: 2019-10-03 | Disposition: A | Discharge status: Home / Self Care | Payer: Medicare Other | Source: Ambulatory Visit | Admitting: Vascular Surgery

## 2019-10-03 ENCOUNTER — Ambulatory Visit (INDEPENDENT_AMBULATORY_CARE_PROVIDER_SITE_OTHER): Payer: Medicare Other | Admitting: Nurse Practitioner

## 2019-10-03 ENCOUNTER — Other Ambulatory Visit: Payer: Self-pay

## 2019-10-03 ENCOUNTER — Telehealth (INDEPENDENT_AMBULATORY_CARE_PROVIDER_SITE_OTHER): Payer: Self-pay

## 2019-10-03 ENCOUNTER — Other Ambulatory Visit (INDEPENDENT_AMBULATORY_CARE_PROVIDER_SITE_OTHER): Payer: Self-pay | Admitting: Nurse Practitioner

## 2019-10-03 ENCOUNTER — Ambulatory Visit (INDEPENDENT_AMBULATORY_CARE_PROVIDER_SITE_OTHER): Payer: Medicare Other

## 2019-10-03 ENCOUNTER — Encounter (INDEPENDENT_AMBULATORY_CARE_PROVIDER_SITE_OTHER): Payer: Self-pay | Admitting: Nurse Practitioner

## 2019-10-03 VITALS — BP 163/76 | HR 54 | Resp 16

## 2019-10-03 DIAGNOSIS — I998 Other disorder of circulatory system: Secondary | ICD-10-CM | POA: Diagnosis not present

## 2019-10-03 DIAGNOSIS — E1169 Type 2 diabetes mellitus with other specified complication: Secondary | ICD-10-CM | POA: Diagnosis not present

## 2019-10-03 DIAGNOSIS — I70202 Unspecified atherosclerosis of native arteries of extremities, left leg: Secondary | ICD-10-CM | POA: Diagnosis not present

## 2019-10-03 DIAGNOSIS — I70239 Atherosclerosis of native arteries of right leg with ulceration of unspecified site: Secondary | ICD-10-CM

## 2019-10-03 DIAGNOSIS — E1152 Type 2 diabetes mellitus with diabetic peripheral angiopathy with gangrene: Secondary | ICD-10-CM | POA: Diagnosis present

## 2019-10-03 DIAGNOSIS — R627 Adult failure to thrive: Secondary | ICD-10-CM | POA: Diagnosis present

## 2019-10-03 DIAGNOSIS — Z20822 Contact with and (suspected) exposure to covid-19: Secondary | ICD-10-CM | POA: Diagnosis present

## 2019-10-03 DIAGNOSIS — N184 Chronic kidney disease, stage 4 (severe): Secondary | ICD-10-CM | POA: Diagnosis present

## 2019-10-03 DIAGNOSIS — I7025 Atherosclerosis of native arteries of other extremities with ulceration: Secondary | ICD-10-CM

## 2019-10-03 DIAGNOSIS — E785 Hyperlipidemia, unspecified: Secondary | ICD-10-CM | POA: Diagnosis present

## 2019-10-03 DIAGNOSIS — Z95820 Peripheral vascular angioplasty status with implants and grafts: Secondary | ICD-10-CM | POA: Diagnosis not present

## 2019-10-03 DIAGNOSIS — I70221 Atherosclerosis of native arteries of extremities with rest pain, right leg: Secondary | ICD-10-CM | POA: Diagnosis not present

## 2019-10-03 DIAGNOSIS — R531 Weakness: Secondary | ICD-10-CM

## 2019-10-03 DIAGNOSIS — D62 Acute posthemorrhagic anemia: Secondary | ICD-10-CM | POA: Diagnosis not present

## 2019-10-03 DIAGNOSIS — Z7189 Other specified counseling: Secondary | ICD-10-CM | POA: Diagnosis not present

## 2019-10-03 DIAGNOSIS — I70261 Atherosclerosis of native arteries of extremities with gangrene, right leg: Secondary | ICD-10-CM | POA: Diagnosis present

## 2019-10-03 DIAGNOSIS — E039 Hypothyroidism, unspecified: Secondary | ICD-10-CM | POA: Diagnosis present

## 2019-10-03 DIAGNOSIS — Z515 Encounter for palliative care: Secondary | ICD-10-CM | POA: Diagnosis not present

## 2019-10-03 DIAGNOSIS — D631 Anemia in chronic kidney disease: Secondary | ICD-10-CM | POA: Diagnosis present

## 2019-10-03 DIAGNOSIS — Z66 Do not resuscitate: Secondary | ICD-10-CM | POA: Diagnosis present

## 2019-10-03 DIAGNOSIS — J189 Pneumonia, unspecified organism: Secondary | ICD-10-CM | POA: Diagnosis not present

## 2019-10-03 DIAGNOSIS — I10 Essential (primary) hypertension: Secondary | ICD-10-CM

## 2019-10-03 DIAGNOSIS — E875 Hyperkalemia: Secondary | ICD-10-CM | POA: Diagnosis not present

## 2019-10-03 DIAGNOSIS — E1122 Type 2 diabetes mellitus with diabetic chronic kidney disease: Secondary | ICD-10-CM | POA: Diagnosis present

## 2019-10-03 DIAGNOSIS — L97519 Non-pressure chronic ulcer of other part of right foot with unspecified severity: Secondary | ICD-10-CM | POA: Diagnosis present

## 2019-10-03 DIAGNOSIS — Z681 Body mass index (BMI) 19 or less, adult: Secondary | ICD-10-CM | POA: Diagnosis not present

## 2019-10-03 DIAGNOSIS — Z789 Other specified health status: Secondary | ICD-10-CM

## 2019-10-03 DIAGNOSIS — I129 Hypertensive chronic kidney disease with stage 1 through stage 4 chronic kidney disease, or unspecified chronic kidney disease: Secondary | ICD-10-CM | POA: Diagnosis present

## 2019-10-03 DIAGNOSIS — R0902 Hypoxemia: Secondary | ICD-10-CM

## 2019-10-03 DIAGNOSIS — I96 Gangrene, not elsewhere classified: Secondary | ICD-10-CM | POA: Diagnosis not present

## 2019-10-03 DIAGNOSIS — E43 Unspecified severe protein-calorie malnutrition: Secondary | ICD-10-CM | POA: Insufficient documentation

## 2019-10-03 DIAGNOSIS — M79671 Pain in right foot: Secondary | ICD-10-CM | POA: Diagnosis present

## 2019-10-03 DIAGNOSIS — I4891 Unspecified atrial fibrillation: Secondary | ICD-10-CM | POA: Diagnosis present

## 2019-10-03 DIAGNOSIS — R0602 Shortness of breath: Secondary | ICD-10-CM

## 2019-10-03 LAB — BASIC METABOLIC PANEL
Anion gap: 12 (ref 5–15)
BUN: 44 mg/dL — ABNORMAL HIGH (ref 8–23)
CO2: 25 mmol/L (ref 22–32)
Calcium: 9.5 mg/dL (ref 8.9–10.3)
Chloride: 103 mmol/L (ref 98–111)
Creatinine, Ser: 2.22 mg/dL — ABNORMAL HIGH (ref 0.44–1.00)
GFR calc Af Amer: 22 mL/min — ABNORMAL LOW (ref >60)
GFR calc non Af Amer: 19 mL/min — ABNORMAL LOW (ref >60)
Glucose, Bld: 122 mg/dL — ABNORMAL HIGH (ref 70–99)
Potassium: 3.8 mmol/L (ref 3.5–5.1)
Sodium: 140 mmol/L (ref 135–145)

## 2019-10-03 LAB — TYPE AND SCREEN
ABO/RH(D): O NEG
Antibody Screen: NEGATIVE

## 2019-10-03 LAB — CBC
HCT: 38.5 % (ref 36.0–46.0)
Hemoglobin: 12.2 g/dL (ref 12.0–15.0)
MCH: 30 pg (ref 26.0–34.0)
MCHC: 31.7 g/dL (ref 30.0–36.0)
MCV: 94.8 fL (ref 80.0–100.0)
Platelets: 239 10*3/uL (ref 150–400)
RBC: 4.06 MIL/uL (ref 3.87–5.11)
RDW: 13.4 % (ref 11.5–15.5)
WBC: 12 10*3/uL — ABNORMAL HIGH (ref 4.0–10.5)
nRBC: 0 % (ref 0.0–0.2)

## 2019-10-03 LAB — PROTIME-INR
INR: 1.2 (ref 0.8–1.2)
Prothrombin Time: 14.3 seconds (ref 11.4–15.2)

## 2019-10-03 LAB — SARS CORONAVIRUS 2 BY RT PCR (HOSPITAL ORDER, PERFORMED IN ~~LOC~~ HOSPITAL LAB): SARS Coronavirus 2: NEGATIVE

## 2019-10-03 LAB — MAGNESIUM: Magnesium: 2.1 mg/dL (ref 1.7–2.4)

## 2019-10-03 LAB — GLUCOSE, CAPILLARY: Glucose-Capillary: 94 mg/dL (ref 70–99)

## 2019-10-03 LAB — MRSA PCR SCREENING: MRSA by PCR: NEGATIVE

## 2019-10-03 LAB — APTT: aPTT: 30 seconds (ref 24–36)

## 2019-10-03 MED ORDER — OXYCODONE HCL 5 MG PO TABS
ORAL_TABLET | ORAL | Status: AC
  Filled 2019-10-03: qty 1

## 2019-10-03 MED ORDER — HEPARIN BOLUS VIA INFUSION
2500.0000 [IU] | Freq: Once | INTRAVENOUS | Status: AC
  Filled 2019-10-03: qty 2500

## 2019-10-03 MED ORDER — HEPARIN (PORCINE) 25000 UT/250ML-% IV SOLN
1050.0000 [IU]/h | INTRAVENOUS | Status: DC
  Administered 2019-10-03: 850 [IU]/h via INTRAVENOUS

## 2019-10-03 MED ORDER — ACETAMINOPHEN 325 MG PO TABS
650.0000 mg | ORAL_TABLET | Freq: Four times a day (QID) | ORAL | Status: DC | PRN
  Administered 2019-10-13 – 2019-10-25 (×6): 650 mg via ORAL
  Filled 2019-10-03 (×6): qty 2

## 2019-10-03 MED ORDER — FAMOTIDINE 20 MG PO TABS: 40.0000 mg | ORAL_TABLET | Freq: Once | ORAL | Status: DC | PRN

## 2019-10-03 MED ORDER — CEFAZOLIN SODIUM-DEXTROSE 1-4 GM/50ML-% IV SOLN
INTRAVENOUS | Status: AC
  Filled 2019-10-03: qty 50

## 2019-10-03 MED ORDER — ACETAMINOPHEN 650 MG RE SUPP
650.0000 mg | Freq: Four times a day (QID) | RECTAL | Status: DC | PRN
  Filled 2019-10-03: qty 1

## 2019-10-03 MED ORDER — LISINOPRIL 5 MG PO TABS
5.0000 mg | ORAL_TABLET | Freq: Every day | ORAL | Status: DC
  Administered 2019-10-05 – 2019-10-12 (×7): 5 mg via ORAL
  Filled 2019-10-03 (×12): qty 1

## 2019-10-03 MED ORDER — METHYLPREDNISOLONE SODIUM SUCC 125 MG IJ SOLR: 125.0000 mg | Freq: Once | INTRAMUSCULAR | Status: DC | PRN

## 2019-10-03 MED ORDER — CEFAZOLIN SODIUM-DEXTROSE 1-4 GM/50ML-% IV SOLN: 1.0000 g | Freq: Once | INTRAVENOUS | Status: DC

## 2019-10-03 MED ORDER — HYDROMORPHONE HCL 1 MG/ML IJ SOLN: 1.0000 mg | Freq: Once | INTRAMUSCULAR | Status: DC | PRN

## 2019-10-03 MED ORDER — DIPHENHYDRAMINE HCL 50 MG/ML IJ SOLN: 50.0000 mg | Freq: Once | INTRAMUSCULAR | Status: DC | PRN

## 2019-10-03 MED ORDER — LEVOTHYROXINE SODIUM 88 MCG PO TABS
88.0000 ug | ORAL_TABLET | Freq: Every day | ORAL | Status: DC
  Administered 2019-10-05 – 2019-10-25 (×20): 88 ug via ORAL
  Filled 2019-10-03 (×23): qty 1

## 2019-10-03 MED ORDER — ONDANSETRON HCL 4 MG/2ML IJ SOLN: 4.0000 mg | Freq: Four times a day (QID) | INTRAMUSCULAR | Status: DC | PRN

## 2019-10-03 MED ORDER — CARVEDILOL 6.25 MG PO TABS
3.1250 mg | ORAL_TABLET | Freq: Two times a day (BID) | ORAL | Status: DC
  Administered 2019-10-03 – 2019-10-25 (×33): 3.125 mg via ORAL
  Filled 2019-10-03 (×39): qty 1

## 2019-10-03 MED ORDER — GABAPENTIN 100 MG PO CAPS
100.0000 mg | ORAL_CAPSULE | Freq: Every evening | ORAL | Status: DC | PRN
  Administered 2019-10-14 – 2019-10-15 (×2): 100 mg via ORAL
  Administered 2019-10-18: 200 mg via ORAL
  Filled 2019-10-03 (×2): qty 1
  Filled 2019-10-03: qty 2

## 2019-10-03 MED ORDER — POTASSIUM CHLORIDE CRYS ER 20 MEQ PO TBCR
20.0000 meq | EXTENDED_RELEASE_TABLET | Freq: Every day | ORAL | Status: DC
  Administered 2019-10-03 – 2019-10-14 (×10): 20 meq via ORAL
  Filled 2019-10-03 (×11): qty 1

## 2019-10-03 MED ORDER — FUROSEMIDE 20 MG PO TABS
20.0000 mg | ORAL_TABLET | Freq: Two times a day (BID) | ORAL | Status: DC
  Administered 2019-10-03 – 2019-10-15 (×19): 20 mg via ORAL
  Filled 2019-10-03 (×21): qty 1

## 2019-10-03 MED ORDER — MIDAZOLAM HCL 2 MG/ML PO SYRP: 8.0000 mg | ORAL_SOLUTION | Freq: Once | ORAL | Status: DC | PRN

## 2019-10-03 MED ORDER — ALLOPURINOL 100 MG PO TABS
100.0000 mg | ORAL_TABLET | Freq: Every day | ORAL | Status: DC
  Administered 2019-10-05 – 2019-10-09 (×5): 100 mg via ORAL
  Filled 2019-10-03 (×6): qty 1

## 2019-10-03 MED ORDER — OXYCODONE HCL 5 MG PO TABS: 5.0000 mg | ORAL_TABLET | Freq: Four times a day (QID) | ORAL | 0 refills | Status: DC | PRN

## 2019-10-03 MED ORDER — HEPARIN (PORCINE) 25000 UT/250ML-% IV SOLN
INTRAVENOUS | Status: AC
  Administered 2019-10-03: 2500 [IU] via INTRAVENOUS
  Filled 2019-10-03: qty 250

## 2019-10-03 MED ORDER — OXYCODONE HCL 5 MG PO TABS
5.0000 mg | ORAL_TABLET | ORAL | Status: DC | PRN
  Administered 2019-10-03 – 2019-10-04 (×2): 5 mg via ORAL
  Filled 2019-10-03: qty 1

## 2019-10-03 MED ORDER — SODIUM CHLORIDE 0.9 % IV SOLN: INTRAVENOUS | Status: DC

## 2019-10-03 NOTE — H&P (View-Only) (Signed)
Subjective:    Patient ID: [name], female    DOB: 19-Jun-1925, 86 y.o.   MRN: [id number] Chief Complaint  Patient presents with  . Follow-up    u/s follow up    The patient presents today after urgent referral by Dr. Vickki Muff, her podiatrist.  The patient notes that her foot has been severely painful and cold for the last 3 days.  The foot is extremely tender to the touch and very erythematous.  The patient has worsening ulcerations on her Achilles area.  The foot is cold.  The patient notes that the pain is consistent and is worse with elevation.  The patient's most recent intervention was done on 07/31/2019, where she was also admitted due to reocclusion of her stents.  However, this is the third reocclusion in approximately the last 6 months.  The patient is not a smoker or tobacco user.  No known hypercoagulable diseases.  Patient has been adhering to her prescribed medical therapy.  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  Extensive tibial level disease is noted.  There is significant progression of disease as compared to the previous exam on 08/23/2019.  The distal posterior tibial artery is occluded.  The anterior tibial artery distally has very low monophasic flow.  The distal peroneal also has very low monophasic flow.   Review of Systems  Cardiovascular:       Severe rest pain, RLE   Skin: Positive for wound.  Neurological: Positive for weakness.       Objective:   Physical Exam Vitals reviewed.  Constitutional:      Appearance: She is ill-appearing.  HENT:     Head: Normocephalic.  Cardiovascular:     Rate and Rhythm: Normal rate and regular rhythm.     Pulses:          Dorsalis pedis pulses are 0 on the right side.       Posterior tibial pulses are 0 on the right side.  Pulmonary:     Effort: Pulmonary effort is normal.       Breath sounds: Normal breath sounds.  Musculoskeletal:        General: Tenderness present.  Skin:    Capillary Refill: Capillary refill takes less than 2 seconds.     Comments: Cold R foot, worsening ulceration   Neurological:     Mental Status: She is alert and oriented to person, place, and time. Mental status is at baseline.  Psychiatric:        Mood and Affect: Mood normal.        Behavior: Behavior normal.        Thought Content: Thought content normal.        Judgment: Judgment normal.     BP (!) 163/76 (BP Location: Right Arm)   Pulse (!) 54   Resp 16   Past Medical History:  Diagnosis Date  . A-fib ([place])   . CHF (congestive heart failure) ([place])   . Diabetes mellitus without complication ([place])   . Hypertension     Social History   Socioeconomic History  . Marital status: Married    Spouse name: Not on file  . Number of children: Not on file  . Years of education: Not on file  . Highest education level: Not on file  Occupational History  . Not on file  Tobacco Use  .  Smoking status: Never Smoker  . Smokeless tobacco: Never Used  Substance and Sexual Activity  . Alcohol use: No  . Drug use: No  . Sexual activity: Not on file  Other Topics Concern  . Not on file  Social History Narrative  . Not on file   Social Determinants of Health   Financial Resource Strain:   . Difficulty of Paying Living Expenses:   Food Insecurity:   . Worried About Charity fundraiser in the Last Year:   . Arboriculturist in the Last Year:   Transportation Needs:   . Film/video editor (Medical):   Marland Kitchen Lack of Transportation (Non-Medical):   Physical Activity:   . Days of Exercise per Week:   . Minutes of Exercise per Session:   Stress:   . Feeling of Stress :   Social Connections:   . Frequency of Communication with Friends and Family:   . Frequency of Social Gatherings with Friends and Family:   . Attends Religious Services:   . Active Member of Clubs or  Organizations:   . Attends Archivist Meetings:   Marland Kitchen Marital Status:   Intimate Partner Violence:   . Fear of Current or Ex-Partner:   . Emotionally Abused:   Marland Kitchen Physically Abused:   . Sexually Abused:     Past Surgical History:  Procedure Laterality Date  . CHOLECYSTECTOMY    . LOWER EXTREMITY ANGIOGRAPHY Right 08/12/2017   Procedure: LOWER EXTREMITY ANGIOGRAPHY;  Surgeon: Katha Cabal, MD;  Location: [facility];  Service: Cardiovascular;  Laterality: Right;  . LOWER EXTREMITY ANGIOGRAPHY Right 06/13/2018   Procedure: LOWER EXTREMITY ANGIOGRAPHY;  Surgeon: Katha Cabal, MD;  Location: [facility];  Service: Cardiovascular;  Laterality: Right;  . LOWER EXTREMITY ANGIOGRAPHY Right 03/01/2019   Procedure: LOWER EXTREMITY ANGIOGRAPHY;  Surgeon: Katha Cabal, MD;  Location: [place] [facility];  Service: Cardiovascular;  Laterality: Right;  . LOWER EXTREMITY ANGIOGRAPHY Right 07/31/2019   Procedure: LOWER EXTREMITY ANGIOGRAPHY;  Surgeon: Katha Cabal, MD;  Location: [facility];  Service: Cardiovascular;  Laterality: Right;    Family History  Problem Relation Age of Onset  . Cancer Brother     Allergies  Allergen Reactions  . Sulfa Antibiotics Hives       Assessment & Plan:   1. Ischemia of extremity  Recommend:  The patient has evidence of severe atherosclerotic changes of both lower extremities associated with ulceration and tissue loss of the foot.  This represents a limb threatening ischemia and places the patient at the risk for limb loss.  Because the patient has had a likely occlusion for 3 days and given the atherosclerotic changes this is an emergent situation.  Patient should undergo angiography of the lower extremities with the hope for intervention for limb salvage.  The risks and benefits as well as the alternative therapies was discussed in detail with the patient.  All questions were answered.  Patient agrees  to proceed with angiography.  We will also have the patient directly admitted to the hospital.  At the time the patient was seen no hospital beds were available so the patient was sent home to await until then notified by the hospital.  We will continue to keep close follow-up and if necessary we will send patient to the emergency room.  - oxyCODONE (OXY IR/ROXICODONE) 5 MG immediate release tablet; Take 1-2 tablets (5-10 mg total) by mouth every 6 (six)  hours as needed for severe pain.  Dispense: 30 tablet; Refill: 0  2. Hyperlipidemia associated with type 2 diabetes mellitus ([place]) Continue statin as ordered and reviewed, no changes at this time   3. Essential hypertension Continue antihypertensive medications as already ordered, these medications have been reviewed and there are no changes at this time.    Current Outpatient Medications on File Prior to Visit  Medication Sig Dispense Refill  . ACCU-CHEK AVIVA PLUS test strip     . acetaminophen (TYLENOL) 325 MG tablet Take 325-650 mg by mouth every 6 (six) hours as needed for headache.     . allopurinol (ZYLOPRIM) 100 MG tablet Take 100 mg by mouth daily.    Marland Kitchen aspirin EC 81 MG EC tablet Take 1 tablet (81 mg total) by mouth daily.    . carvedilol (COREG) 3.125 MG tablet Take 3.125 mg by mouth 2 (two) times daily with a meal.     . clopidogrel (PLAVIX) 75 MG tablet Take 1 tablet (75 mg total) by mouth daily. 30 tablet 11  . collagenase (SANTYL) ointment Santyl 250 unit/gram topical ointment    . furosemide (LASIX) 20 MG tablet Take 20 mg by mouth 2 (two) times daily.     Marland Kitchen gabapentin (NEURONTIN) 100 MG capsule Take 100-200 mg by mouth at bedtime as needed (pain).     Marland Kitchen glucose blood (ACCU-CHEK AVIVA PLUS) test strip CHECK BLOOD SUGAR 3 TIMES DAILY    . HUMALOG KWIKPEN 100 UNIT/ML KiwkPen Inject 3 Units into the skin 3 (three) times daily after meals. Per sliding scale    . HYDROcodone-acetaminophen (NORCO/VICODIN) 5-325 MG tablet Take by  mouth.    Marland Kitchen LEVEMIR FLEXTOUCH 100 UNIT/ML Pen Inject 12 Units into the skin at bedtime.     Marland Kitchen levothyroxine (SYNTHROID, LEVOTHROID) 88 MCG tablet Take 88 mcg by mouth daily before breakfast.     . lisinopril (ZESTRIL) 5 MG tablet Take 5 mg by mouth daily.     Marland Kitchen lovastatin (MEVACOR) 20 MG tablet Take 20 mg by mouth daily with supper.     . meclizine (ANTIVERT) 25 MG tablet Take 25 mg by mouth 3 (three) times daily as needed for dizziness.     . Multiple Vitamins-Minerals (PRESERVISION AREDS 2 PO) Take 1 tablet by mouth every evening.    . potassium chloride SA (K-DUR,KLOR-CON) 20 MEQ tablet Take 20 mEq by mouth daily.     . Vitamin D3 (VITAMIN D) 25 MCG tablet Take 1,000 Units by mouth daily.     . traMADol (ULTRAM) 50 MG tablet Take 1 tablet (50 mg total) by mouth every 8 (eight) hours as needed for moderate pain or severe pain. (Patient not taking: Reported on 08/23/2019) 15 tablet 0   No current facility-administered medications on file prior to visit.    There are no Patient Instructions on file for this visit. No follow-ups on file.   Kris Hartmann, NP

## 2019-10-03 NOTE — Consult Note (Signed)
ANTICOAGULATION CONSULT NOTE - Initial Consult  Pharmacy Consult for Heparin drip Indication: Lower Extremity Ischemia  Allergies  Allergen Reactions   Sulfa Antibiotics Hives    Patient Measurements: Height: 5\' 3"  (160 cm) Weight: 53.1 kg (117 lb) IBW/kg (Calculated) : 52.4 Heparin Dosing Weight: 53.1kg  Vital Signs: Temp: 97.6 F (36.4 C) (06/23 1724) Temp Source: Oral (06/23 1724) BP: 183/78 (06/23 1724) Pulse Rate: 61 (06/23 1724)  Labs: No results for input(s): HGB, HCT, PLT, APTT, LABPROT, INR, HEPARINUNFRC, HEPRLOWMOCWT, CREATININE, CKTOTAL, CKMB, TROPONINIHS in the last 72 hours.  CrCl cannot be calculated (Patient's most recent lab result is older than the maximum 21 days allowed.).   Medical History: Past Medical History:  Diagnosis Date   A-fib ([place])    CHF (congestive heart failure) ([place])    Diabetes mellitus without complication ([facility])    Hypertension     Medications:  No PTA anticoagulation of record  Assessment: 86 yo female with limb threatening ischemia.  Pharmacy has been consulted to initiate and monitor a heparin drip.  Goal of Therapy:  Heparin level 0.3-0.7 units/ml Monitor platelets by anticoagulation protocol: Yes   Plan:  Will obtain baseline labs - APTT, INR, and CBC (will also f/u CrCl once BMP results).  Will start heparin drip with 2500 units bolus, followed by 850 units/hr.  Will check heparin level in 8 hours and CBC's daily per protocol.   Lu Duffel, PharmD, BCPS Clinical Pharmacist 10/03/2019 5:36 PM

## 2019-10-03 NOTE — Plan of Care (Signed)

## 2019-10-03 NOTE — Progress Notes (Signed)
Report given to oncoming shift.  Oncoming shift will assess ulceration on foot to complete admission assessment.

## 2019-10-03 NOTE — Progress Notes (Signed)
Notified Levada Dy, RN that patient was given and documented PO oxy per PRN order. Dr. Delana Meyer to update fluid order to 5ml/hr due to patient's CHF, currently running at 11ml/hr. Notified Levada Dy, RN that patient received heparin bolus per [facility] order, and verified continuous rate with same RN. Patient's belongings were left at the bedside and son was updated and notified of patient's location/room number.

## 2019-10-03 NOTE — Progress Notes (Signed)
Subjective:    Patient ID: [name], female    DOB: [birth year], 86 y.o.   MRN: [id number] Chief Complaint  Patient presents with  . Follow-up    u/s follow up    The patient presents today after urgent referral by Dr. Vickki Muff, her podiatrist.  The patient notes that her foot has been severely painful and cold for the last 3 days.  The foot is extremely tender to the touch and very erythematous.  The patient has worsening ulcerations on her Achilles area.  The foot is cold.  The patient notes that the pain is consistent and is worse with elevation.  The patient's most recent intervention was done on 07/31/2019, where she was also admitted due to reocclusion of her stents.  However, this is the third reocclusion in approximately the last 6 months.  The patient is not a smoker or tobacco user.  No known hypercoagulable diseases.  Patient has been adhering to her prescribed medical therapy.  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  Extensive tibial level disease is noted.  There is significant progression of disease as compared to the previous exam on 08/23/2019.  The distal posterior tibial artery is occluded.  The anterior tibial artery distally has very low monophasic flow.  The distal peroneal also has very low monophasic flow.   Review of Systems  Cardiovascular:       Severe rest pain, RLE   Skin: Positive for wound.  Neurological: Positive for weakness.       Objective:   Physical Exam Vitals reviewed.  Constitutional:      Appearance: She is ill-appearing.  HENT:     Head: Normocephalic.  Cardiovascular:     Rate and Rhythm: Normal rate and regular rhythm.     Pulses:          Dorsalis pedis pulses are 0 on the right side.       Posterior tibial pulses are 0 on the right side.  Pulmonary:     Effort: Pulmonary effort is normal.       Breath sounds: Normal breath sounds.  Musculoskeletal:        General: Tenderness present.  Skin:    Capillary Refill: Capillary refill takes less than 2 seconds.     Comments: Cold R foot, worsening ulceration   Neurological:     Mental Status: She is alert and oriented to person, place, and time. Mental status is at baseline.  Psychiatric:        Mood and Affect: Mood normal.        Behavior: Behavior normal.        Thought Content: Thought content normal.        Judgment: Judgment normal.     BP (!) 163/76 (BP Location: Right Arm)   Pulse (!) 54   Resp 16   Past Medical History:  Diagnosis Date  . A-fib ([place])   . CHF (congestive heart failure) ([place])   . Diabetes mellitus without complication ([place])   . Hypertension     Social History   Socioeconomic History  . Marital status: Married    Spouse name: Not on file  . Number of children: Not on file  . Years of education: Not on file  . Highest education level: Not on file  Occupational History  . Not on file  Tobacco Use  .  Smoking status: Never Smoker  . Smokeless tobacco: Never Used  Substance and Sexual Activity  . Alcohol use: No  . Drug use: No  . Sexual activity: Not on file  Other Topics Concern  . Not on file  Social History Narrative  . Not on file   Social Determinants of Health   Financial Resource Strain:   . Difficulty of Paying Living Expenses:   Food Insecurity:   . Worried About Charity fundraiser in the Last Year:   . Arboriculturist in the Last Year:   Transportation Needs:   . Film/video editor (Medical):   Marland Kitchen Lack of Transportation (Non-Medical):   Physical Activity:   . Days of Exercise per Week:   . Minutes of Exercise per Session:   Stress:   . Feeling of Stress :   Social Connections:   . Frequency of Communication with Friends and Family:   . Frequency of Social Gatherings with Friends and Family:   . Attends Religious Services:   . Active Member of Clubs or  Organizations:   . Attends Archivist Meetings:   Marland Kitchen Marital Status:   Intimate Partner Violence:   . Fear of Current or Ex-Partner:   . Emotionally Abused:   Marland Kitchen Physically Abused:   . Sexually Abused:     Past Surgical History:  Procedure Laterality Date  . CHOLECYSTECTOMY    . LOWER EXTREMITY ANGIOGRAPHY Right 08/12/2017   Procedure: LOWER EXTREMITY ANGIOGRAPHY;  Surgeon: Katha Cabal, MD;  Location: [place] [facility];  Service: Cardiovascular;  Laterality: Right;  . LOWER EXTREMITY ANGIOGRAPHY Right 06/13/2018   Procedure: LOWER EXTREMITY ANGIOGRAPHY;  Surgeon: Katha Cabal, MD;  Location: [place] [facility];  Service: Cardiovascular;  Laterality: Right;  . LOWER EXTREMITY ANGIOGRAPHY Right 03/01/2019   Procedure: LOWER EXTREMITY ANGIOGRAPHY;  Surgeon: Katha Cabal, MD;  Location: [place] [facility];  Service: Cardiovascular;  Laterality: Right;  . LOWER EXTREMITY ANGIOGRAPHY Right 07/31/2019   Procedure: LOWER EXTREMITY ANGIOGRAPHY;  Surgeon: Katha Cabal, MD;  Location: [place] [facility];  Service: Cardiovascular;  Laterality: Right;    Family History  Problem Relation Age of Onset  . Cancer Brother     Allergies  Allergen Reactions  . Sulfa Antibiotics Hives       Assessment & Plan:   1. Ischemia of extremity  Recommend:  The patient has evidence of severe atherosclerotic changes of both lower extremities associated with ulceration and tissue loss of the foot.  This represents a limb threatening ischemia and places the patient at the risk for limb loss.  Because the patient has had a likely occlusion for 3 days and given the atherosclerotic changes this is an emergent situation.  Patient should undergo angiography of the lower extremities with the hope for intervention for limb salvage.  The risks and benefits as well as the alternative therapies was discussed in detail with the patient.  All questions were answered.  Patient agrees  to proceed with angiography.  We will also have the patient directly admitted to the hospital.  At the time the patient was seen no hospital beds were available so the patient was sent home to await until then notified by the hospital.  We will continue to keep close follow-up and if necessary we will send patient to the emergency room.  - oxyCODONE (OXY IR/ROXICODONE) 5 MG immediate release tablet; Take 1-2 tablets (5-10 mg total) by mouth every 6 (six)  hours as needed for severe pain.  Dispense: 30 tablet; Refill: 0  2. Hyperlipidemia associated with type 2 diabetes mellitus ([place]) Continue statin as ordered and reviewed, no changes at this time   3. Essential hypertension Continue antihypertensive medications as already ordered, these medications have been reviewed and there are no changes at this time.    Current Outpatient Medications on File Prior to Visit  Medication Sig Dispense Refill  . ACCU-CHEK AVIVA PLUS test strip     . acetaminophen (TYLENOL) 325 MG tablet Take 325-650 mg by mouth every 6 (six) hours as needed for headache.     . allopurinol (ZYLOPRIM) 100 MG tablet Take 100 mg by mouth daily.    Marland Kitchen aspirin EC 81 MG EC tablet Take 1 tablet (81 mg total) by mouth daily.    . carvedilol (COREG) 3.125 MG tablet Take 3.125 mg by mouth 2 (two) times daily with a meal.     . clopidogrel (PLAVIX) 75 MG tablet Take 1 tablet (75 mg total) by mouth daily. 30 tablet 11  . collagenase (SANTYL) ointment Santyl 250 unit/gram topical ointment    . furosemide (LASIX) 20 MG tablet Take 20 mg by mouth 2 (two) times daily.     Marland Kitchen gabapentin (NEURONTIN) 100 MG capsule Take 100-200 mg by mouth at bedtime as needed (pain).     Marland Kitchen glucose blood (ACCU-CHEK AVIVA PLUS) test strip CHECK BLOOD SUGAR 3 TIMES DAILY    . HUMALOG KWIKPEN 100 UNIT/ML KiwkPen Inject 3 Units into the skin 3 (three) times daily after meals. Per sliding scale    . HYDROcodone-acetaminophen (NORCO/VICODIN) 5-325 MG tablet Take by  mouth.    Marland Kitchen LEVEMIR FLEXTOUCH 100 UNIT/ML Pen Inject 12 Units into the skin at bedtime.     Marland Kitchen levothyroxine (SYNTHROID, LEVOTHROID) 88 MCG tablet Take 88 mcg by mouth daily before breakfast.     . lisinopril (ZESTRIL) 5 MG tablet Take 5 mg by mouth daily.     Marland Kitchen lovastatin (MEVACOR) 20 MG tablet Take 20 mg by mouth daily with supper.     . meclizine (ANTIVERT) 25 MG tablet Take 25 mg by mouth 3 (three) times daily as needed for dizziness.     . Multiple Vitamins-Minerals (PRESERVISION AREDS 2 PO) Take 1 tablet by mouth every evening.    . potassium chloride SA (K-DUR,KLOR-CON) 20 MEQ tablet Take 20 mEq by mouth daily.     . Vitamin D3 (VITAMIN D) 25 MCG tablet Take 1,000 Units by mouth daily.     . traMADol (ULTRAM) 50 MG tablet Take 1 tablet (50 mg total) by mouth every 8 (eight) hours as needed for moderate pain or severe pain. (Patient not taking: Reported on 08/23/2019) 15 tablet 0   No current facility-administered medications on file prior to visit.    There are no Patient Instructions on file for this visit. No follow-ups on file.   Kris Hartmann, NP

## 2019-10-03 NOTE — Telephone Encounter (Signed)
Spoke with patient's son and let him know to take her to the ED to be admitted through there and have her covid test as well per Dr. Delana Meyer.

## 2019-10-04 ENCOUNTER — Encounter: Payer: Self-pay | Admitting: Vascular Surgery

## 2019-10-04 ENCOUNTER — Encounter
Admission: EM | Disposition: A | Discharge status: Home Health | Payer: Self-pay | Source: Ambulatory Visit | Attending: Vascular Surgery

## 2019-10-04 DIAGNOSIS — I70202 Unspecified atherosclerosis of native arteries of extremities, left leg: Secondary | ICD-10-CM

## 2019-10-04 DIAGNOSIS — I70221 Atherosclerosis of native arteries of extremities with rest pain, right leg: Secondary | ICD-10-CM

## 2019-10-04 HISTORY — PX: LOWER EXTREMITY ANGIOGRAPHY: CATH118251

## 2019-10-04 LAB — BASIC METABOLIC PANEL
Anion gap: 11 (ref 5–15)
BUN: 48 mg/dL — ABNORMAL HIGH (ref 8–23)
CO2: 28 mmol/L (ref 22–32)
Calcium: 9.1 mg/dL (ref 8.9–10.3)
Chloride: 101 mmol/L (ref 98–111)
Creatinine, Ser: 2.07 mg/dL — ABNORMAL HIGH (ref 0.44–1.00)
GFR calc Af Amer: 24 mL/min — ABNORMAL LOW (ref >60)
GFR calc non Af Amer: 20 mL/min — ABNORMAL LOW (ref >60)
Glucose, Bld: 176 mg/dL — ABNORMAL HIGH (ref 70–99)
Potassium: 3.6 mmol/L (ref 3.5–5.1)
Sodium: 140 mmol/L (ref 135–145)

## 2019-10-04 LAB — CBC
HCT: 32.4 % — ABNORMAL LOW (ref 36.0–46.0)
Hemoglobin: 10.7 g/dL — ABNORMAL LOW (ref 12.0–15.0)
MCH: 30.5 pg (ref 26.0–34.0)
MCHC: 33 g/dL (ref 30.0–36.0)
MCV: 92.3 fL (ref 80.0–100.0)
Platelets: 225 10*3/uL (ref 150–400)
RBC: 3.51 MIL/uL — ABNORMAL LOW (ref 3.87–5.11)
RDW: 13.8 % (ref 11.5–15.5)
WBC: 9.5 10*3/uL (ref 4.0–10.5)
nRBC: 0 % (ref 0.0–0.2)

## 2019-10-04 LAB — HEPARIN LEVEL (UNFRACTIONATED)
Heparin Unfractionated: <0.1 IU/mL — ABNORMAL LOW (ref 0.30–0.70)
Heparin Unfractionated: 0.52 IU/mL (ref 0.30–0.70)

## 2019-10-04 LAB — GLUCOSE, CAPILLARY: Glucose-Capillary: 132 mg/dL — ABNORMAL HIGH (ref 70–99)

## 2019-10-04 SURGERY — LOWER EXTREMITY ANGIOGRAPHY
Anesthesia: Moderate Sedation | Laterality: Right

## 2019-10-04 MED ORDER — HEPARIN BOLUS VIA INFUSION
1600.0000 [IU] | Freq: Once | INTRAVENOUS | Status: AC
  Administered 2019-10-04: 1600 [IU] via INTRAVENOUS
  Filled 2019-10-04: qty 1600

## 2019-10-04 MED ORDER — CHLORHEXIDINE GLUCONATE CLOTH 2 % EX PADS: 6.0000 | MEDICATED_PAD | Freq: Every day | CUTANEOUS | Status: DC

## 2019-10-04 MED ORDER — TIROFIBAN HCL IV 12.5 MG/250 ML
INTRAVENOUS | Status: AC
  Administered 2019-10-04: 1332.5 ug via INTRAVENOUS
  Filled 2019-10-04: qty 250

## 2019-10-04 MED ORDER — HEPARIN SODIUM (PORCINE) 1000 UNIT/ML IJ SOLN
INTRAMUSCULAR | Status: AC
  Filled 2019-10-04: qty 1

## 2019-10-04 MED ORDER — MIDAZOLAM HCL 2 MG/2ML IJ SOLN
INTRAMUSCULAR | Status: DC | PRN
  Administered 2019-10-04 (×2): 0.5 mg via INTRAVENOUS

## 2019-10-04 MED ORDER — FENTANYL CITRATE (PF) 100 MCG/2ML IJ SOLN
INTRAMUSCULAR | Status: AC
  Filled 2019-10-04: qty 2

## 2019-10-04 MED ORDER — HEPARIN SODIUM (PORCINE) 1000 UNIT/ML IJ SOLN
INTRAMUSCULAR | Status: DC | PRN
  Administered 2019-10-04: 3000 [IU] via INTRAVENOUS
  Administered 2019-10-04: 1000 [IU] via INTRAVENOUS

## 2019-10-04 MED ORDER — FENTANYL CITRATE (PF) 100 MCG/2ML IJ SOLN
INTRAMUSCULAR | Status: DC | PRN
  Administered 2019-10-04 (×2): 25 ug via INTRAVENOUS

## 2019-10-04 MED ORDER — SODIUM CHLORIDE 0.9 % IV SOLN: 250.0000 mL | INTRAVENOUS | Status: DC | PRN

## 2019-10-04 MED ORDER — ONDANSETRON HCL 4 MG/2ML IJ SOLN
4.0000 mg | Freq: Four times a day (QID) | INTRAMUSCULAR | Status: DC | PRN
  Administered 2019-10-14 – 2019-10-21 (×2): 4 mg via INTRAVENOUS
  Filled 2019-10-04 (×2): qty 2

## 2019-10-04 MED ORDER — MIDAZOLAM HCL 5 MG/5ML IJ SOLN
INTRAMUSCULAR | Status: AC
  Filled 2019-10-04: qty 5

## 2019-10-04 MED ORDER — OXYCODONE HCL 5 MG PO TABS
5.0000 mg | ORAL_TABLET | ORAL | Status: DC | PRN
  Administered 2019-10-04 (×2): 10 mg via ORAL
  Administered 2019-10-05 – 2019-10-06 (×3): 5 mg via ORAL
  Administered 2019-10-06 – 2019-10-11 (×10): 10 mg via ORAL
  Administered 2019-10-11: 5 mg via ORAL
  Administered 2019-10-11: 10 mg via ORAL
  Administered 2019-10-12: 5 mg via ORAL
  Administered 2019-10-12: 10 mg via ORAL
  Administered 2019-10-12: 5 mg via ORAL
  Administered 2019-10-12 – 2019-10-14 (×3): 10 mg via ORAL
  Administered 2019-10-14 – 2019-10-16 (×6): 5 mg via ORAL
  Administered 2019-10-17 – 2019-10-24 (×11): 10 mg via ORAL
  Filled 2019-10-04 (×7): qty 2
  Filled 2019-10-04: qty 1
  Filled 2019-10-04 (×8): qty 2
  Filled 2019-10-04: qty 1
  Filled 2019-10-04 (×2): qty 2
  Filled 2019-10-04: qty 1
  Filled 2019-10-04 (×2): qty 2
  Filled 2019-10-04 (×3): qty 1
  Filled 2019-10-04 (×4): qty 2
  Filled 2019-10-04 (×4): qty 1
  Filled 2019-10-04 (×4): qty 2
  Filled 2019-10-04: qty 1
  Filled 2019-10-04 (×2): qty 2

## 2019-10-04 MED ORDER — SODIUM CHLORIDE 0.9% FLUSH
3.0000 mL | Freq: Two times a day (BID) | INTRAVENOUS | Status: DC
  Administered 2019-10-04 – 2019-10-24 (×30): 3 mL via INTRAVENOUS

## 2019-10-04 MED ORDER — MORPHINE SULFATE (PF) 2 MG/ML IV SOLN
2.0000 mg | INTRAVENOUS | Status: DC | PRN
  Administered 2019-10-04 – 2019-10-07 (×3): 2 mg via INTRAVENOUS
  Filled 2019-10-04 (×2): qty 1

## 2019-10-04 MED ORDER — CHLORHEXIDINE GLUCONATE CLOTH 2 % EX PADS
6.0000 | MEDICATED_PAD | Freq: Every day | CUTANEOUS | Status: DC
  Administered 2019-10-04 – 2019-10-06 (×3): 6 via TOPICAL

## 2019-10-04 MED ORDER — TIROFIBAN (AGGRASTAT) BOLUS VIA INFUSION
25.0000 ug/kg | Freq: Once | INTRAVENOUS | Status: AC
  Filled 2019-10-04: qty 27

## 2019-10-04 MED ORDER — CEFAZOLIN SODIUM-DEXTROSE 1-4 GM/50ML-% IV SOLN
INTRAVENOUS | Status: AC
  Administered 2019-10-04: 1 g via INTRAVENOUS
  Filled 2019-10-04: qty 50

## 2019-10-04 MED ORDER — TIROFIBAN HCL IV 12.5 MG/250 ML
0.0750 ug/kg/min | INTRAVENOUS | Status: DC
  Administered 2019-10-04: 0.075 ug/kg/min via INTRAVENOUS

## 2019-10-04 MED ORDER — SODIUM CHLORIDE 0.9 % IV SOLN: INTRAVENOUS | Status: DC

## 2019-10-04 MED ORDER — ALTEPLASE 2 MG IJ SOLR
INTRAMUSCULAR | Status: DC | PRN
  Administered 2019-10-04: 10 mg

## 2019-10-04 MED ORDER — MORPHINE SULFATE (PF) 2 MG/ML IV SOLN
1.0000 mg | INTRAVENOUS | Status: DC | PRN
  Administered 2019-10-04: 2 mg via INTRAVENOUS
  Filled 2019-10-04: qty 1

## 2019-10-04 MED ORDER — ACETAMINOPHEN 325 MG PO TABS: 650.0000 mg | ORAL_TABLET | ORAL | Status: DC | PRN

## 2019-10-04 MED ORDER — SODIUM CHLORIDE 0.9% FLUSH: 3.0000 mL | INTRAVENOUS | Status: DC | PRN

## 2019-10-04 MED ORDER — IODIXANOL 320 MG/ML IV SOLN
INTRAVENOUS | Status: DC | PRN
  Administered 2019-10-04: 50 mL via INTRA_ARTERIAL

## 2019-10-04 MED ORDER — ALTEPLASE 2 MG IJ SOLR
INTRAMUSCULAR | Status: AC
  Filled 2019-10-04: qty 10

## 2019-10-04 SURGICAL SUPPLY — 34 items
BALLN LUTONIX 018 4X80X130 (BALLOONS) ×2
BALLN ULTRVRSE 2.5X300X150 (BALLOONS) ×2
BALLN ULTRVRSE 2X150X150 (BALLOONS) ×1
BALLN ULTRVRSE 2X150X150 OTW (BALLOONS) ×1
BALLN ULTRVRSE 3X40X150 (BALLOONS) ×1
BALLN ULTRVRSE 3X40X150 OTW (BALLOONS) ×1
BALLOON LUTONIX 018 4X80X130 (BALLOONS) ×1 IMPLANT
BALLOON ULTRVRSE 2.5X300X150 (BALLOONS) ×1 IMPLANT
BALLOON ULTRVRSE 2X150X150 OTW (BALLOONS) ×1 IMPLANT
BALLOON ULTRVRSE 3X40X150 OTW (BALLOONS) ×1 IMPLANT
CANISTER PENUMBRA ENGINE (MISCELLANEOUS) ×2 IMPLANT
CANNULA 5F STIFF (CANNULA) ×2 IMPLANT
CATH BEACON 5 .035 65 RIM TIP (CATHETERS) ×2 IMPLANT
CATH INDIGO CAT6 KIT (CATHETERS) ×4 IMPLANT
CATH INDIGO SEP 6 (CATHETERS) ×2 IMPLANT
CATH INFUS 135CMX50CM (CATHETERS) ×2 IMPLANT
CATH SEEKER .018X150 (CATHETERS) ×2 IMPLANT
CATH VERT 5FR 125CM (CATHETERS) ×2 IMPLANT
DEVICE PRESTO INFLATION (MISCELLANEOUS) ×2 IMPLANT
DEVICE SAFEGUARD 24CM (GAUZE/BANDAGES/DRESSINGS) ×2 IMPLANT
DEVICE STARCLOSE SE CLOSURE (Vascular Products) ×2 IMPLANT
GLIDEWIRE ADV .035X260CM (WIRE) ×2 IMPLANT
GUIDEWIRE PFTE-COATED .018X300 (WIRE) ×2 IMPLANT
NEEDLE ENTRY 21GA 7CM ECHOTIP (NEEDLE) ×2 IMPLANT
PACK ANGIOGRAPHY (CUSTOM PROCEDURE TRAY) ×2 IMPLANT
SET INTRO CAPELLA COAXIAL (SET/KITS/TRAYS/PACK) ×2 IMPLANT
SHEATH ANL2 6FRX45 HC (SHEATH) ×2 IMPLANT
SHEATH BRITE TIP 5FRX11 (SHEATH) ×2 IMPLANT
SHEATH BRITE TIP 6FRX11 (SHEATH) ×2 IMPLANT
SHEATH PINNACLE ST 6F 45CM (SHEATH) ×2 IMPLANT
SYR MEDRAD MARK 7 150ML (SYRINGE) ×2 IMPLANT
TUBING CONTRAST HIGH PRESS 72 (TUBING) ×2 IMPLANT
WIRE AMPLATZ SSTIFF .035X260CM (WIRE) ×2 IMPLANT
WIRE RUNTHROUGH .014X300CM (WIRE) ×2 IMPLANT

## 2019-10-04 NOTE — Op Note (Signed)
[facility] Percutaneous Study/Intervention Procedural Note   Date of Surgery: 10/04/2019  Surgeon: [name]  Pre-operative Diagnosis: Atherosclerotic occlusive disease bilateral lower extremities with rest pain and ischemia of the right lower extremity  Post-operative diagnosis: Same  Procedure(s) Performed: 1. Introduction catheter into right lower extremity 3rd order catheter placement with additional third order 2. Contrast injection right lower extremity for distal runoff  3. Percutaneous transluminal angioplasty to 4 mm with Lutonix drug-eluting balloon mid to distal popliteal to 4 mm 4. Percutaneous transluminal angioplasty to 3 mm right anterior tibial.  5. Percutaneous transluminal angioplasty to 2.5 mm right peroneal artery.              6.  Mechanical thrombectomy of the right SFA and popliteal using the penumbra CAT 6 device in association with 10 mg of TPA.              7.  Star close closure left common femoral arteriotomy  Anesthesia: Conscious sedation was administered under my direct supervision by the interventional radiology RN. IV Versed plus fentanyl were utilized. Continuous ECG, pulse oximetry and blood pressure was monitored throughout the entire procedure.  Conscious sedation was for a total of 2 hour 17 minutes.  Sheath: 6 French destination sheath left common femoral retrograde  Contrast: 50 cc  Fluoroscopy Time: 18.5 minutes  Indications: [name] Sailer presents with increasing pain of the right lower extremity.  She has a long history of multiple interventions and multiple stents.  Noninvasive studies as well as physical examination suggests occlusion of the SFA popliteal stent with subsequent ischemia and rest pain.  This suggests the patient is having limb threatening ischemia. The risks and benefits are reviewed all questions answered patient agrees to  proceed.  Procedure:[name] is a 86 y.o. y.o. female who was identified and appropriate procedural time out was performed. The patient was then placed supine on the table and prepped and draped in the usual sterile fashion.   Ultrasound was placed in the sterile sleeve and the left groin was evaluated the left common femoral artery was echolucent and pulsatile indicating patency. Image was recorded for the permanent record and under real-time visualization a microneedle was inserted into the common femoral artery followed by the microwire and then the micro-sheath. A J-wire was then advanced through the micro-sheath and a 5 [place] sheath was then inserted over a J-wire. J-wire was then advanced and a 5 French rim catheter was positioned at the level of the aortic bifurcation.  An LAO view of the pelvis was obtained. Subsequently a rim catheter with the advantage Glidewire was used to cross the aortic bifurcation the catheter wire were advanced down into the right distal external iliac artery. Oblique view of the femoral bifurcation was then obtained and subsequently the wire was reintroduced and the rim catheter negotiated into the SFA representing third order catheter placement.  Rim catheter was then exchanged for a 125 cm vertebral catheter which was negotiated down with the advantage wire into the distal popliteal just below the distal margin of the stent.  Distal runoff was then performed.  Diagnostic interpretation: The right common internal and external iliac demonstrate mild atherosclerotic changes but there are no hemodynamically significant stenoses.  The right common femoral and profunda femoris remain patent with diffuse atherosclerosis but no hemodynamically significant lesions.  Previously placed stents in the SFA and proximal to mid popliteal are noted there is a small cul-de-sac in the proximal SFA but otherwise  the SFA and popliteal arteries are occluded.  Distal imaging  demonstrates the anterior tibial is now diffusely diseased with greater than 90% stenosis noted throughout its proximal half.  The tibioperoneal trunk and peroneal are both diffusely diseased with greater than 90% stenoses throughout their course.  Essentially both the anterior tibial and peroneal arteries are atretic for a long segment.  Based on this finding I will proceed with intervention.  4000 units of heparin was then given and allowed to circulate and a Alita Chyle but then a 6 [place] destination sheath was advanced up and over the bifurcation and positioned in the common femoral artery.  Infusion catheter with a 50 cm infusion length was then positioned across the entire length of the popliteal and SFA.  10 mg of TPA was then infused and allowed to dwell for approximately 30 minutes.  The penumbra CAT 6 device was then delivered onto the field and 2 full passes were then made with a CAT 6 device.  This reestablished patency of the SFA and popliteal however there remains only trickle flow which I believe is based on the poor outflow in the tibial vessels.  The wire and catheter were then negotiated down and a 0.018 advantage wire is negotiated into the peroneal.  Over this wire the distal popliteal was treated with a 4 mm x 80 mm Lutonix drug-eluting balloon inflated to 10 atm for 1 full minute.  A 2.5 mm x 100 mm balloon was then advanced into the peroneal and angioplasty to 8 atm for 1 minute was performed.  A vertebral catheter was then advanced over the wire into the peroneal and using a Touhy Borst hand-injection of contrast through the catheter and the peroneal demonstrated the angioplasty site is widely patent with less than 10% residual stenosis.  The catheter was then pulled back into the distal popliteal and the wire and catheter was negotiated into the anterior tibial.  Wires negotiated down to the dorsum of the foot.  Hand-injection contrast through the vertebral demonstrates the atretic  anterior tibial.  A 3 mm x 30 cm Ultraverse balloon was then advanced over the wire and the entire length of the anterior tibial is angioplastied.  Follow-up imaging through the catheter positioned at the origin of the anterior tibial now demonstrates the anterior tibial is widely patent with less than 10% residual stenosis.  Hand-injection through the catheter up through the popliteal SFA demonstrate a segment of residual thrombus right at the tibial plateau.  The CAT 6 was reintroduced and this area is aspirated.  Retrieval of a significant portion of thrombotic material is a chain.  Hand-injection contrast again demonstrates trickle flow I believed secondary to poor distal outflow and I elected to terminate the procedure at this point and place the patient on Aggrastat.  After review of these images the sheath is pulled into the left external iliac oblique of the common femoral is obtained and a Star close device deployed. There no immediate Complications.  Findings:  Initial findings are as noted above.  Following mechanical thrombectomy there is elimination of nearly all the thrombotic material in the SFA and popliteal.  Distal popliteal appears to be diseased with greater than 70% stenosis.  After angioplasty of the distal popliteal to 4 mm there is less than 10% residual stenosis.  After angioplasty of the tibial vessels to 2.5 mm for the peroneal and 3 mm for the anterior tibial there is less than 10% residual stenosis.  Patient will be maintained on Aggrastat  overnight with the hope for durable limb salvage.     Disposition: Patient was taken to the recovery room in stable condition having tolerated the procedure well.  Belenda Cruise Nhat Hearne 10/04/2019,10:36 AM

## 2019-10-04 NOTE — Consult Note (Signed)
ANTICOAGULATION CONSULT NOTE - Initial Consult  Pharmacy Consult for Heparin drip Indication: Lower Extremity Ischemia  Allergies  Allergen Reactions  . Sulfa Antibiotics Hives    Patient Measurements: Height: 5\' 3"  (160 cm) Weight: 53.2 kg (117 lb 4.6 oz) IBW/kg (Calculated) : 52.4 Heparin Dosing Weight: 53.1kg  Vital Signs: Temp: 97.9 F (36.6 C) (06/24 0200) Temp Source: Oral (06/24 0200) BP: 176/57 (06/24 0300) Pulse Rate: 55 (06/24 0300)  Labs: Recent Labs    10/03/19 1746 10/04/19 0220  HGB 12.2 10.7*  HCT 38.5 32.4*  PLT 239 225  APTT 30  --   LABPROT 14.3  --   INR 1.2  --   HEPARINUNFRC  --  <0.10*  CREATININE 2.22* 2.07*    Estimated Creatinine Clearance: 14.3 mL/min (A) (by C-G formula based on SCr of 2.07 mg/dL (H)).   Medical History: Past Medical History:  Diagnosis Date  . A-fib ([place])   . CHF (congestive heart failure) ([place])   . Diabetes mellitus without complication ([place])   . Hypertension     Medications:  No PTA anticoagulation of record  Assessment: 86 yo female with limb threatening ischemia.  Pharmacy has been consulted to initiate and monitor a heparin drip.  Goal of Therapy:  Heparin level 0.3-0.7 units/ml Monitor platelets by anticoagulation protocol: Yes   Plan:  Will obtain baseline labs - APTT, INR, and CBC (will also f/u CrCl once BMP results).  Will start heparin drip with 2500 units bolus, followed by 850 units/hr.  6/24:  HL @ < 0.1  Heparin 1600 units IV x 1 and increase drip rate to 1050 units/hr.  Will recheck HL 8 hrs after rate change.   Will check heparin level in 8 hours and CBC's daily per protocol.   Orene Desanctis, PharmD Clinical Pharmacist 10/04/2019 3:33 AM

## 2019-10-05 LAB — BASIC METABOLIC PANEL
Anion gap: 9 (ref 5–15)
BUN: 41 mg/dL — ABNORMAL HIGH (ref 8–23)
CO2: 24 mmol/L (ref 22–32)
Calcium: 8.7 mg/dL — ABNORMAL LOW (ref 8.9–10.3)
Chloride: 106 mmol/L (ref 98–111)
Creatinine, Ser: 1.68 mg/dL — ABNORMAL HIGH (ref 0.44–1.00)
GFR calc Af Amer: 30 mL/min — ABNORMAL LOW (ref >60)
GFR calc non Af Amer: 26 mL/min — ABNORMAL LOW (ref >60)
Glucose, Bld: 142 mg/dL — ABNORMAL HIGH (ref 70–99)
Potassium: 3.8 mmol/L (ref 3.5–5.1)
Sodium: 139 mmol/L (ref 135–145)

## 2019-10-05 LAB — CBC
HCT: 29.8 % — ABNORMAL LOW (ref 36.0–46.0)
Hemoglobin: 9.8 g/dL — ABNORMAL LOW (ref 12.0–15.0)
MCH: 30.3 pg (ref 26.0–34.0)
MCHC: 32.9 g/dL (ref 30.0–36.0)
MCV: 92.3 fL (ref 80.0–100.0)
Platelets: 233 10*3/uL (ref 150–400)
RBC: 3.23 MIL/uL — ABNORMAL LOW (ref 3.87–5.11)
RDW: 13.7 % (ref 11.5–15.5)
WBC: 11.6 10*3/uL — ABNORMAL HIGH (ref 4.0–10.5)
nRBC: 0 % (ref 0.0–0.2)

## 2019-10-05 LAB — HEPARIN LEVEL (UNFRACTIONATED): Heparin Unfractionated: 0.46 IU/mL (ref 0.30–0.70)

## 2019-10-05 LAB — MAGNESIUM: Magnesium: 1.9 mg/dL (ref 1.7–2.4)

## 2019-10-05 MED ORDER — ASPIRIN EC 81 MG PO TBEC: 81.0000 mg | DELAYED_RELEASE_TABLET | Freq: Every day | ORAL | Status: DC

## 2019-10-05 MED ORDER — HEPARIN (PORCINE) 25000 UT/250ML-% IV SOLN
1000.0000 [IU]/h | INTRAVENOUS | Status: DC
  Administered 2019-10-05 – 2019-10-09 (×5): 1000 [IU]/h via INTRAVENOUS
  Filled 2019-10-05 (×5): qty 250

## 2019-10-05 MED ORDER — CLOPIDOGREL BISULFATE 75 MG PO TABS: 75.0000 mg | ORAL_TABLET | Freq: Every day | ORAL | Status: DC

## 2019-10-05 NOTE — Progress Notes (Signed)
[facility] Daily Progress Note   Subjective: 10/04/19: 1. Introduction catheter into right lower extremity 3rd order catheter placement with additional third order 2. Contrast injection right lower extremity for distal runoff  3. Percutaneous transluminal angioplasty to 4 mm with Lutonix drug-eluting balloon mid to distal popliteal to 4 mm 4. Percutaneous transluminal angioplasty to 3 mm right anterior tibial.  5. Percutaneous transluminal angioplasty to 2.5 mm right peroneal artery.              6.  Mechanical thrombectomy of the right SFA and popliteal using the penumbra CAT 6 device in association with 10 mg of TPA.              7.  Star close closure left common femoral arteriotomy  Continued right foot pain.  No issues overnight.  Objective: Vitals:   10/05/19 0300 10/05/19 0400 10/05/19 0500 10/05/19 0600  BP: (!) 129/55 (!) 142/82 (!) 169/97 (!) 143/89  Pulse: (!) 51 86 (!) 31 (!) 58  Resp: 14 (!) 24 (!) 21 16  Temp:      TempSrc:      SpO2: 93% 96% (!) 68% 100%  Weight:      Height:        Intake/Output Summary (Last 24 hours) at 10/05/2019 1241 Last data filed at 10/05/2019 0600 Gross per 24 hour  Intake 352.82 ml  Output 1400 ml  Net -1047.18 ml   Physical Exam: A&Ox3, NAD CV: RRR Pulmonary: CTA Bilaterally Abdomen: Soft, Nontender, Nondistended Vascular:  Right lower extremity: Thigh soft.  Calf soft.  Extremities warm distally in toes.  Foot is warm.  Unable to palpate pedal pulses.   Laboratory: CBC    Component Value Date/Time   WBC 11.6 (H) 10/05/2019 0620   HGB 9.8 (L) 10/05/2019 0620   HCT 29.8 (L) 10/05/2019 0620   PLT 233 10/05/2019 0620   BMET    Component Value Date/Time   NA 139 10/05/2019 0620   NA 141 08/03/2011 0729   K 3.8 10/05/2019 0620   K 3.6 08/03/2011 0729   CL 106 10/05/2019 0620   CL 105 08/03/2011 0729   CO2 24 10/05/2019 0620   CO2 26  08/03/2011 0729   GLUCOSE 142 (H) 10/05/2019 0620   GLUCOSE 149 (H) 08/03/2011 0729   BUN 41 (H) 10/05/2019 0620   BUN 16 08/03/2011 0729   CREATININE 1.68 (H) 10/05/2019 0620   CREATININE 1.07 08/03/2011 0729   CALCIUM 8.7 (L) 10/05/2019 0620   CALCIUM 9.5 08/03/2011 0729   GFRNONAA 26 (L) 10/05/2019 0620   GFRNONAA 48 (L) 08/03/2011 0729   GFRAA 30 (L) 10/05/2019 0620   GFRAA 56 (L) 08/03/2011 0729   Assessment/Planning: The patient is a 86 year old female who presented with right lower extremity ischemia status post angiogram with intervention - POD#1  1) Aggrastat stopped.  Heparin drip started. 2) Unable to palpate pedal pulses however the foot is warm.  We will continue to treat with blood thinners and reassess in a few days. 3) Transfer out of the ICU 4) PT / OT / Ambulation  Seen and examined with Dr. Eber Hong [facility] PA-C 10/05/2019 12:41 PM

## 2019-10-05 NOTE — Progress Notes (Signed)
PT Cancellation Note  Patient Details Name: [name] MRN: [id number] DOB: 11-15-1925   Cancelled Treatment:    Reason Eval/Treat Not Completed: Other (comment). Pt alert, in bed, polite but firm about not working with rehab services today. She stated she would like to talk with her family about her goals and POC. Despite PT providing active listening, encouragement and motivation pt declined services today. RN notified of potential benefit of chaplain visit for pt. PT to re-attempt as able.  Lieutenant Diego PT, DPT 2:31 PM,10/05/19

## 2019-10-05 NOTE — Consult Note (Signed)
[facility] for Heparin drip Indication: Lower Extremity Ischemia  Allergies  Allergen Reactions  . Sulfa Antibiotics Hives    Patient Measurements: Height: 5\' 3"  (160 cm) Weight: 53.3 kg (117 lb 8.1 oz) IBW/kg (Calculated) : 52.4 Heparin Dosing Weight: 53.1kg  Vital Signs: Temp: 97.5 F (36.4 C) (06/25 1921) Temp Source: Oral (06/25 1921) BP: 129/52 (06/25 1921) Pulse Rate: 59 (06/25 1921)  Labs: Recent Labs    10/03/19 1746 10/03/19 1746 10/04/19 0220 10/04/19 1214 10/05/19 0620 10/05/19 1847  HGB 12.2   < > 10.7*  --  9.8*  --   HCT 38.5  --  32.4*  --  29.8*  --   PLT 239  --  225  --  233  --   APTT 30  --   --   --   --   --   LABPROT 14.3  --   --   --   --   --   INR 1.2  --   --   --   --   --   HEPARINUNFRC  --   --  <0.10* 0.52  --  0.46  CREATININE 2.22*  --  2.07*  --  1.68*  --    < > = values in this interval not displayed.    Estimated Creatinine Clearance: 17.7 mL/min (A) (by C-G formula based on SCr of 1.68 mg/dL (H)).   Medical History: Past Medical History:  Diagnosis Date  . A-fib ([place])   . CHF (congestive heart failure) ([place])   . Diabetes mellitus without complication ([place])   . Hypertension     Assessment: 86 yo female with limb threatening ischemia. Patient s/p mechanical thrombectomy. Was on Aggrastat, now stopped. Pharmacy has been consulted to restart a heparin drip.  Goal of Therapy:  Heparin level 0.3-0.7 units/ml Monitor platelets by anticoagulation protocol: Yes   Plan:  6/25 1847 HL 0.46, therapeutic x1. Will continue heparin drip at current rate (1000 units/hr). Recheck HL in ~8h. CBC daily while on heparin drip.    Pharmacy will continue to follow.   Rocky Morel, PharmD, BCPS Clinical Pharmacist 10/05/2019 7:47 PM

## 2019-10-05 NOTE — H&P (Signed)
[facility] History & Physical Update  The patient was interviewed and re-examined.  The patient's previous History and Physical has been reviewed and is unchanged.  There is no change in the plan of care. We plan to proceed with the scheduled procedure.  [name], MD  10/05/2019, 4:34 PM

## 2019-10-05 NOTE — Consult Note (Signed)
[facility] [place] for Heparin drip Indication: Lower Extremity Ischemia  Allergies  Allergen Reactions  . Sulfa Antibiotics Hives    Patient Measurements: Height: 5\' 3"  (160 cm) Weight: 53.3 kg (117 lb 8.1 oz) IBW/kg (Calculated) : 52.4 Heparin Dosing Weight: 53.1kg  Vital Signs: Temp: 98 F (36.7 C) (06/25 0200) Temp Source: Oral (06/25 0200) BP: 143/89 (06/25 0600) Pulse Rate: 58 (06/25 0600)  Labs: Recent Labs    10/03/19 1746 10/03/19 1746 10/04/19 0220 10/04/19 1214 10/05/19 0620  HGB 12.2   < > 10.7*  --  9.8*  HCT 38.5  --  32.4*  --  29.8*  PLT 239  --  225  --  233  APTT 30  --   --   --   --   LABPROT 14.3  --   --   --   --   INR 1.2  --   --   --   --   HEPARINUNFRC  --   --  <0.10* 0.52  --   CREATININE 2.22*  --  2.07*  --  1.68*   < > = values in this interval not displayed.    Estimated Creatinine Clearance: 17.7 mL/min (A) (by C-G formula based on SCr of 1.68 mg/dL (H)).   Medical History: Past Medical History:  Diagnosis Date  . A-fib ([place])   . CHF (congestive heart failure) ([place])   . Diabetes mellitus without complication ([place])   . Hypertension     Assessment: 86 yo female with limb threatening ischemia. Patient s/p mechanical thrombectomy. Was on Aggrastat, now stopped. Pharmacy has been consulted to restart a heparin drip.  Goal of Therapy:  Heparin level 0.3-0.7 units/ml Monitor platelets by anticoagulation protocol: Yes   Plan:  With proximity to Aggrastat, will omit heparin bolus and start drip at 1000 units/hr. Patient was previously on heparin drip and therapeutic at 1050 units/hr. Check HL at 1900. CBC daily while on heparin drip.    Tawnya Crook, PharmD Clinical Pharmacist 10/05/2019 11:30 AM

## 2019-10-05 NOTE — Progress Notes (Signed)
Patients right dorsalis and tibial pulses absent with doppler. Right Foot is warm and right popliteal and femoral pulse can be palpated.Dr Delana Meyer made aware. Dr schnier instructed to continue aggarastat throughout the morning and monitor for bleeding.  Will continue to monitor site.

## 2019-10-05 NOTE — Plan of Care (Signed)
Transferred from ICU this evening. No signs of acute distress.

## 2019-10-05 NOTE — Progress Notes (Signed)
OT Cancellation Note  Patient Details Name: [name] MRN: [id number] DOB: Mar 05, 1926   Cancelled Treatment:    Reason Eval/Treat Not Completed: Other (comment)  Upon PT and OT presenting to room to introduce role and attempt to work with pt for evaluation, pt politely declines. She is alert and appropriate. States she would like to speak to family re: goals of care. States she may be willing to try therapy tomorrow, but needs time to think today. PT and OT implement therapeutic listening and therapeutic use-of-self to comfort pt, but she remains firm in declining therapy at this time. Will f/u at later date/time as able for OT evalution. Thank you.  Gerrianne Scale, [place], OTR/L ascom [phone] 10/05/19, 3:29 PM

## 2019-10-06 LAB — HEPARIN LEVEL (UNFRACTIONATED): Heparin Unfractionated: 0.66 IU/mL (ref 0.30–0.70)

## 2019-10-06 LAB — CBC
HCT: 27.8 % — ABNORMAL LOW (ref 36.0–46.0)
Hemoglobin: 9 g/dL — ABNORMAL LOW (ref 12.0–15.0)
MCH: 30.4 pg (ref 26.0–34.0)
MCHC: 32.4 g/dL (ref 30.0–36.0)
MCV: 93.9 fL (ref 80.0–100.0)
Platelets: 242 10*3/uL (ref 150–400)
RBC: 2.96 MIL/uL — ABNORMAL LOW (ref 3.87–5.11)
RDW: 13.5 % (ref 11.5–15.5)
WBC: 10.4 10*3/uL (ref 4.0–10.5)
nRBC: 0 % (ref 0.0–0.2)

## 2019-10-06 LAB — BASIC METABOLIC PANEL
Anion gap: 10 (ref 5–15)
BUN: 42 mg/dL — ABNORMAL HIGH (ref 8–23)
CO2: 25 mmol/L (ref 22–32)
Calcium: 9 mg/dL (ref 8.9–10.3)
Chloride: 103 mmol/L (ref 98–111)
Creatinine, Ser: 1.84 mg/dL — ABNORMAL HIGH (ref 0.44–1.00)
GFR calc Af Amer: 27 mL/min — ABNORMAL LOW (ref >60)
GFR calc non Af Amer: 23 mL/min — ABNORMAL LOW (ref >60)
Glucose, Bld: 117 mg/dL — ABNORMAL HIGH (ref 70–99)
Potassium: 3.8 mmol/L (ref 3.5–5.1)
Sodium: 138 mmol/L (ref 135–145)

## 2019-10-06 LAB — MAGNESIUM: Magnesium: 1.8 mg/dL (ref 1.7–2.4)

## 2019-10-06 NOTE — Consult Note (Addendum)
[facility] for Heparin drip Indication: Lower Extremity Ischemia  Allergies  Allergen Reactions  . Sulfa Antibiotics Hives    Patient Measurements: Height: 5\' 3"  (160 cm) Weight: 57.2 kg (126 lb 1.7 oz) IBW/kg (Calculated) : 52.4 Heparin Dosing Weight: 53.1kg  Vital Signs: Temp: 97.9 F (36.6 C) (06/26 0406) Temp Source: Oral (06/26 0406) BP: 139/60 (06/26 0406) Pulse Rate: 74 (06/26 0406)  Labs: Recent Labs    10/03/19 1746 10/03/19 1746 10/04/19 0220 10/04/19 0220 10/04/19 1214 10/05/19 0620 10/05/19 1847 10/06/19 0425  HGB 12.2   < > 10.7*   < >  --  9.8*  --  9.0*  HCT 38.5   < > 32.4*  --   --  29.8*  --  27.8*  PLT 239   < > 225  --   --  233  --  242  APTT 30  --   --   --   --   --   --   --   LABPROT 14.3  --   --   --   --   --   --   --   INR 1.2  --   --   --   --   --   --   --   HEPARINUNFRC  --   --  <0.10*   < > 0.52  --  0.46 0.66  CREATININE 2.22*   < > 2.07*  --   --  1.68*  --  1.84*   < > = values in this interval not displayed.    Estimated Creatinine Clearance: 16.1 mL/min (A) (by C-G formula based on SCr of 1.84 mg/dL (H)).   Medical History: Past Medical History:  Diagnosis Date  . A-fib ([place])   . CHF (congestive heart failure) ([place])   . Diabetes mellitus without complication ([place])   . Hypertension     Assessment: 86 yo female with limb threatening ischemia. Patient s/p mechanical thrombectomy. Was on Aggrastat, now stopped. Pharmacy has been consulted to restart a heparin drip.  Goal of Therapy:  Heparin level 0.3-0.7 units/ml Monitor platelets by anticoagulation protocol: Yes   Plan:  6/26 0425 HL 0.66, therapeutic x2. CBC stable.  Will continue heparin drip at current rate (1000 units/hr). Recheck HL and CBC daily while on heparin drip.    Pharmacy will continue to follow.   Ena Dawley, PharmD Clinical Pharmacist 10/06/2019 5:23 AM

## 2019-10-06 NOTE — Progress Notes (Signed)
2 Days Post-Op   Subjective/Chief Complaint: Complains of continued Right foot pain. States its about the same as yesterday. Controlled with current regimen. Otherwise without complaint.On heparin gtt.    Objective: Vital signs in last 24 hours: Temp:  [97.5 F (36.4 C)-98.6 F (37 C)] 97.9 F (36.6 C) (06/26 0406) Pulse Rate:  [59-107] 74 (06/26 0406) Resp:  [17-21] 20 (06/26 0406) BP: (120-157)/(43-114) 139/60 (06/26 0406) SpO2:  [98 %-100 %] 98 % (06/26 0406) Weight:  [57.2 kg] 57.2 kg (06/26 0500) Last BM Date: 10/02/19  Intake/Output from previous day: 06/25 0701 - 06/26 0700 In: 428.2 [P.O.:240; I.V.:188.2] Out: 400 [Urine:400] Intake/Output this shift: No intake/output data recorded.  General appearance: alert and no distress Resp: clear to auscultation bilaterally Cardio: regular rate and rhythm Extremities: Left groin- soft, no hematoma, Right lower extremity- warm to foot, toes slightly cool, +motor, +sensory, tender to touch  Lab Results:  Recent Labs    10/05/19 0620 10/06/19 0425  WBC 11.6* 10.4  HGB 9.8* 9.0*  HCT 29.8* 27.8*  PLT 233 242   BMET Recent Labs    10/05/19 0620 10/06/19 0425  NA 139 138  K 3.8 3.8  CL 106 103  CO2 24 25  GLUCOSE 142* 117*  BUN 41* 42*  CREATININE 1.68* 1.84*  CALCIUM 8.7* 9.0   PT/INR Recent Labs    10/03/19 1746  LABPROT 14.3  INR 1.2   ABG No results for input(s): PHART, HCO3 in the last 72 hours.  Invalid input(s): PCO2, PO2  Studies/Results: No results found.  Anti-infectives: Anti-infectives (From admission, onward)   Start     Dose/Rate Route Frequency Ordered Stop   10/04/19 0000  ceFAZolin (ANCEF) IVPB 1 g/50 mL premix  Status:  Discontinued       Note to Pharmacy: To be given in specials   1 g 100 mL/hr over 30 Minutes Intravenous  Once 10/03/19 1711 10/04/19 3295      Assessment/Plan: s/p Procedure(s): Lower Extremity Angiography (Right) S/P  1. Introduction catheter  intorightlower extremity 3rd order catheter placement with additional third order 2.Contrast injectionrightlower extremity for distal runoff  3. Percutaneous transluminal angioplastyto 4 mm with Lutonix drug-eluting balloon mid to distal popliteal to 4 mm4. Percutaneous transluminal angioplastyto 3 mm right anterior tibial.  5. Percutaneous transluminal angioplasty to 2.5 mm right peroneal artery. 6.Mechanical thrombectomy of the right SFA and popliteal using the penumbra CAT 6 device in association with 10 mg of TPA. 7.Star close closureleftcommon femoral arteriotomy   Continue Heparin gtt OOB as tolerated with PT   LOS: 3 days    Jamesetta So A 10/06/2019

## 2019-10-06 NOTE — Evaluation (Signed)
Occupational Therapy Evaluation Patient Details Name: [name] MRN: [id number] DOB: Jul 23, 1925 Today's Date: 10/06/2019    History of Present Illness 86yo female pt admitted for limb threatening ischemia. S/p RLE distal popliteal and R anterior tibial angioplasty, R SFA and popliteal thrombectomy, and femoral arteriotomy on 10/03/19. PMHx includes Afib, CHF, DM, HTN, HLD.   Clinical Impression   Pt was seen for OT evaluation this date. Prior to hospital admission and recent R foot pain, pt was ambulating with a 2WW by herself, able to sponge bath, dress, and manage medications independently, and had family assist with cleaning, cooking, and transportation. Pt's dtr present and reports that she and her 2 other sisters take turns staying overnight with the pt and that "someone is typically there with [the pt] most of the day but she might be home for an hour or two by herself." Pt lives in 1 story home with w/c accessible layout and ramp. Currently pt demonstrates impairments as described below (See OT problem list) which functionally limit her ability to perform ADL/self-care tasks. Pt currently requires PRN Min A for LB ADL, Min A for STS ADL transfers with 2WW, and CGA for lateral scoots in standing. Pt reporting increased R foot pain with standing but able to maintain NWBing without cues or difficulty. Pt/dtr educated in role of OT, fxl ADL t/f training, RLE NWBing precaution, and recommendations for t/f to [facility] or w/c using RW  - will attempt next session. Pt would benefit from skilled OT to address noted impairments and functional limitations (see below for any additional details) in order to maximize safety and independence while minimizing falls risk and caregiver burden. Upon hospital discharge, currently recommend STR to maximize pt safety and return to PLOF, with goal of improvement within a couple days to facilitate safe return home with [facility], should pt improve enough for family to be able to  provide needed level of care.     Follow Up Recommendations  SNF    Equipment Recommendations  None recommended by OT    Recommendations for Other Services       Precautions / Restrictions Precautions Precautions: Fall Restrictions Weight Bearing Restrictions: Yes RLE Weight Bearing: Non weight bearing      Mobility Bed Mobility Overal bed mobility: Needs Assistance Bed Mobility: Supine to Sit;Sit to Supine     Supine to sit: Supervision Sit to supine: Supervision   General bed mobility comments: additional time/effort to perform  Transfers Overall transfer level: Needs assistance Equipment used: Rolling walker (2 wheeled) Transfers: Sit to/from Stand;Lateral/Scoot Transfers Sit to Stand: Min assist        Lateral/Scoot Transfers: Min assist General transfer comment: Min A to perform multiple attempts, R foot pain increasing with standing but able to maintain static standing for >1 min, able to laterally scoot in standing by shifting L foot and WBing through RW with BUE with CGA    Balance Overall balance assessment: Needs assistance Sitting-balance support: Feet unsupported;No upper extremity supported Sitting balance-[name] Scale: Good     Standing balance support: Bilateral upper extremity supported Standing balance-[name] Scale: Fair                             ADL either performed or assessed with clinical judgement   ADL Overall ADL's : Needs assistance/impaired Eating/Feeding: Independent   Grooming: Modified independent   Upper Body Bathing: Sitting;Set up;Supervision/ safety   Lower Body Bathing: Sitting/lateral leans;Minimal assistance  Upper Body Dressing : Sitting;Set up;Supervision/safety   Lower Body Dressing: Sitting/lateral leans;Minimal assistance Lower Body Dressing Details (indicate cue type and reason): able to don socks at bed level without difficulty, anticipate PRN Min A for LB clothing 2/2 R foot pain                      Vision Patient Visual Report: No change from baseline       Perception     Praxis      Pertinent Vitals/Pain Pain Assessment: 0-10 Pain Score: 8  Pain Location: R foot, shooting into R thigh Pain Descriptors / Indicators: Shooting;Sharp;Grimacing;Guarding Pain Intervention(s): Limited activity within patient's tolerance;Monitored during session;Repositioned;Patient requesting pain meds-RN notified     Hand Dominance Right   Extremity/Trunk Assessment Upper Extremity Assessment Upper Extremity Assessment: Overall WFL for tasks assessed;Generalized weakness (grossly 4-/5 to 4/5 bilat)   Lower Extremity Assessment Lower Extremity Assessment: Overall WFL for tasks assessed;Generalized weakness;RLE deficits/detail (pt/dtr endorse hx of diabetic neuropathy in B feet) RLE Deficits / Details: R foot/ankle pain limiting assessment, but able to maintain NWBing status with multiple transfer attempts RLE: Unable to fully assess due to pain   Cervical / Trunk Assessment Cervical / Trunk Assessment: Kyphotic   Communication Communication Communication: HOH   Cognition Arousal/Alertness: Awake/alert Behavior During Therapy: WFL for tasks assessed/performed Overall Cognitive Status: Within Functional Limits for tasks assessed                                     General Comments       Exercises Other Exercises Other Exercises: Pt/dtr educated in role of OT, fxl ADL t/f training, RLE NWBing precaution, and recommendations for t/f to [facility] or w/c using RW  - will attempt next session   Shoulder Instructions      Home Living Family/patient expects to be discharged to:: Private residence   Available Help at Discharge: Family;Available 24 hours/day (3 daughters take turns staying overnight, generally someone with her during most of the day, maybe home alone for a couple hours total) Type of Home: House Home Access: Ramped entrance     Home Layout: One  level     Bathroom Shower/Tub: Walk-in Hydrologist: Standard Bathroom Accessibility: Yes How Accessible: Accessible via wheelchair;Accessible via walker Home Equipment: [place] - 2 wheels;Walker - 4 wheels;Cane - single point;Grab bars - tub/shower;Grab bars - toilet;Bedside commode;Shower seat;Wheelchair - manual;Toilet riser;Wheelchair - power          Prior Functioning/Environment Level of Independence: Needs assistance  Gait / Transfers Assistance Needed: ambulates with RW at baseline ADL's / Homemaking Assistance Needed: bird bath, able to dress mod I. Family performs cooking/cleaning/shopping etc. Pt indep with med mgt   Comments: no falls in the last 12 months        OT Problem List: Decreased strength;Pain;Impaired sensation;Decreased activity tolerance;Decreased knowledge of use of DME or AE;Impaired balance (sitting and/or standing)      OT Treatment/Interventions: Self-care/ADL training;Therapeutic exercise;Therapeutic activities;DME and/or AE instruction;Patient/family education;Balance training    OT Goals(Current goals can be found in the care plan section) Acute Rehab OT Goals Patient Stated Goal: to get better and go home from hospital OT Goal Formulation: With patient/family Time For Goal Achievement: 10/20/19 Potential to Achieve Goals: Good ADL Goals Pt Will Perform Lower Body Dressing: sit to/from stand;with supervision Pt Will Transfer to Toilet: with supervision;bedside commode;stand pivot  transfer  OT Frequency: Min 2X/week   Barriers to D/C:            Co-evaluation              AM-PAC OT "6 Clicks" Daily Activity     Outcome Measure Help from another person eating meals?: None Help from another person taking care of personal grooming?: None Help from another person toileting, which includes using toliet, bedpan, or urinal?: A Lot Help from another person bathing (including washing, rinsing, drying)?: A Little Help  from another person to put on and taking off regular upper body clothing?: None Help from another person to put on and taking off regular lower body clothing?: A Little 6 Click Score: 20   End of Session Equipment Utilized During Treatment: Gait belt;Rolling walker Nurse Communication: Patient requests pain meds;Mobility status  Activity Tolerance: Patient tolerated treatment well Patient left: in bed;with call bell/phone within reach;with bed alarm set;with family/visitor present  OT Visit Diagnosis: Other abnormalities of gait and mobility (R26.89);Muscle weakness (generalized) (M62.81);Pain Pain - Right/Left: Right Pain - part of body: Ankle and joints of foot                Time: 1119-1203 OT Time Calculation (min): 44 min Charges:  OT General Charges $OT Visit: 1 Visit OT Evaluation $OT Eval Moderate Complexity: 1 Mod OT Treatments $Self Care/Home Management : 8-22 mins $Therapeutic Activity: 8-22 mins  Jeni Salles, MPH, MS, OTR/L ascom [phone] 10/06/19, 12:49 PM

## 2019-10-06 NOTE — Evaluation (Signed)
Physical Therapy Evaluation Patient Details Name: [name] MRN: [id number] DOB: 1926-01-27 Today's Date: 10/06/2019   History of Present Illness  86yo female pt admitted for limb threatening ischemia. S/p RLE distal popliteal and R anterior tibial angioplasty, R SFA and popliteal thrombectomy, and femoral arteriotomy on 10/03/19. PMHx includes Afib, CHF, DM, HTN, HLD.    Clinical Impression  Patient and daughter present for evaluation. Patient lives at home and her three daughters take turns assisting her nearly 24/7. She ambulates with a RW at baseline and needs assistance with ADLs and IADLs. Upon evaluation, patient completed bed mobiltiy with supervision, and required min A to complete sit <> stand at edge of bed, mod A to complete sit <> stand at chair, and max A to complete squat pivot transfer bed<> chair. She was unable to ambulate at this time second to pain on the R LE and NWB status on that limb. Patient appears to have experienced a significant decline in functional mobility and independence and would benefit from short term rehab prior to going home. It is not recommended she return home with the need of max A squat pivot transfer but could progress to home care if her transfers can be improved. Patient would benefit from skilled physical therapy to address impairments and functional limitations (see PT Problem List below) to work towards stated goals and return to PLOF or maximal functional independence.       Follow Up Recommendations SNF    Equipment Recommendations  None recommended by PT    Recommendations for Other Services       Precautions / Restrictions Precautions Precautions: Fall Restrictions Weight Bearing Restrictions: Yes RLE Weight Bearing: Non weight bearing Other Position/Activity Restrictions: Per vascular surgeon      Mobility  Bed Mobility Overal bed mobility: Needs Assistance Bed Mobility: Supine to Sit;Sit to Supine     Supine to sit:  Supervision Sit to supine: Supervision   General bed mobility comments: additional time/effort to perform  Transfers Overall transfer level: Needs assistance Equipment used: Rolling walker (2 wheeled);None Transfers: Sit to/from Stand Sit to Stand: Min assist        Lateral/Scoot Transfers: Min assist;Max assist;Mod assist;From elevated surface General transfer comment: Patient able to perform sit <> stand at edge of bed and at edge of chair with standing up to 1 minute. Required min A at edge of bed and mod A to control lower into the chair when she was tired. Completed squat pivot transfer with max A to/from chair form/to bed. Required multiple attempts due to pt having difficulty pushing with legs when holding on to PT. Improved confidence and pushing with L LE when she uses hands on chair rails.  Ambulation/Gait             General Gait Details: unable to take steps at this time  Stairs            Wheelchair Mobility    Modified Rankin (Stroke Patients Only)       Balance Overall balance assessment: Needs assistance Sitting-balance support: Feet unsupported;No upper extremity supported Sitting balance-[name] Scale: Good     Standing balance support: Bilateral upper extremity supported Standing balance-[name] Scale: Poor Standing balance comment: paitient unsteady in standing and sinks down into stooped posture without tactile and verbal cues and support to maintain balance.  Pertinent Vitals/Pain Pain Assessment: Faces Faces Pain Scale: Hurts whole lot Pain Location: right foot, especially with pressure to the foot Pain Descriptors / Indicators: Guarding;Grimacing;Other (Comment) (reluctant to place on floor) Pain Intervention(s): Limited activity within patient's tolerance;Monitored during session;Premedicated before session    Home Living Family/patient expects to be discharged to:: Private residence   Available  Help at Discharge: Family;Available 24 hours/day (3 daughters take turns staying overnight, generally someone with her during most of the day, maybe home alone for a couple hours total) Type of Home: House Home Access: Ramped entrance     Home Layout: One level Home Equipment: [place] - 2 wheels;Walker - 4 wheels;Cane - single point;Grab bars - tub/shower;Grab bars - toilet;Bedside commode;Shower seat;Wheelchair - manual;Toilet riser;Wheelchair - power      Prior Function Level of Independence: Needs assistance   Gait / Transfers Assistance Needed: ambulates with RW at baseline  ADL's / Homemaking Assistance Needed: bird bath, able to dress mod I. Family performs cooking/cleaning/shopping etc. Pt indep with med mgt  Comments: no falls in the last 12 months     Hand Dominance   Dominant Hand: Right    Extremity/Trunk Assessment   Upper Extremity Assessment Upper Extremity Assessment: Defer to OT evaluation    Lower Extremity Assessment Lower Extremity Assessment: RLE deficits/detail;LLE deficits/detail RLE Deficits / Details: R foot/ankle pain limiting assessment, unable to weight bear on R LE. RLE: Unable to fully assess due to pain LLE Deficits / Details: generalized weakness    Cervical / Trunk Assessment Cervical / Trunk Assessment: Kyphotic  Communication   Communication: HOH  Cognition Arousal/Alertness: Awake/alert Behavior During Therapy: WFL for tasks assessed/performed Overall Cognitive Status: Within Functional Limits for tasks assessed                                        General Comments      Exercises Other Exercises Other Exercises: seated B marching x 10 each side, seated LAQ x 10 each side, seated heel slides with sheet under R foot to decrease friction, attempted ankle pumps and got slight movement in R toes, standing R toe touch back and forth (attempted slide,with no WB). Other Exercises: Educated pt and daughter about transfers  and POC   Assessment/Plan    PT Assessment Patient needs continued PT services  PT Problem List Decreased strength;Decreased coordination;Pain;Decreased range of motion;Decreased cognition;Decreased activity tolerance;Decreased knowledge of use of DME;Decreased balance;Decreased safety awareness;Decreased mobility;Decreased knowledge of precautions;Decreased skin integrity       PT Treatment Interventions Gait training;Stair training;Functional mobility training;Therapeutic activities;Therapeutic exercise;Neuromuscular re-education;Balance training;DME instruction;Cognitive remediation;Patient/family education;Wheelchair mobility training    PT Goals (Current goals can be found in the Care Plan section)  Acute Rehab PT Goals Patient Stated Goal: to get better and go home from hospital PT Goal Formulation: With patient/family Time For Goal Achievement: 10/20/19 Potential to Achieve Goals: Fair    Frequency Min 2X/week   Barriers to discharge Decreased caregiver support patient requires heavy assist for transfers    Co-evaluation               AM-PAC PT "6 Clicks" Mobility  Outcome Measure Help needed turning from your back to your side while in a flat bed without using bedrails?: None Help needed moving from lying on your back to sitting on the side of a flat bed without using bedrails?: None Help needed moving to and from  a bed to a chair (including a wheelchair)?: A Lot Help needed standing up from a chair using your arms (e.g., wheelchair or bedside chair)?: A Little Help needed to walk in hospital room?: Total Help needed climbing 3-5 steps with a railing? : Total 6 Click Score: 15    End of Session Equipment Utilized During Treatment: Gait belt Activity Tolerance: Patient limited by pain;Patient tolerated treatment well Patient left: in bed;with bed alarm set;with family/visitor present;with call bell/phone within reach Nurse Communication: Mobility status;Weight  bearing status PT Visit Diagnosis: Unsteadiness on feet (R26.81);Muscle weakness (generalized) (M62.81);Difficulty in walking, not elsewhere classified (R26.2)    Time: 1515-1610 PT Time Calculation (min) (ACUTE ONLY): 55 min   Charges:   PT Evaluation $PT Eval Moderate Complexity: 1 Mod PT Treatments $Therapeutic Exercise: 8-22 mins $Therapeutic Activity: 23-37 mins        Everlean Alstrom. Graylon Good, PT, DPT 10/06/19, 5:54 PM

## 2019-10-07 LAB — CBC
HCT: 24.4 % — ABNORMAL LOW (ref 36.0–46.0)
Hemoglobin: 8 g/dL — ABNORMAL LOW (ref 12.0–15.0)
MCH: 30.3 pg (ref 26.0–34.0)
MCHC: 32.8 g/dL (ref 30.0–36.0)
MCV: 92.4 fL (ref 80.0–100.0)
Platelets: 230 10*3/uL (ref 150–400)
RBC: 2.64 MIL/uL — ABNORMAL LOW (ref 3.87–5.11)
RDW: 13.7 % (ref 11.5–15.5)
WBC: 9.2 10*3/uL (ref 4.0–10.5)
nRBC: 0 % (ref 0.0–0.2)

## 2019-10-07 LAB — HEPARIN LEVEL (UNFRACTIONATED): Heparin Unfractionated: 0.69 IU/mL (ref 0.30–0.70)

## 2019-10-07 NOTE — Plan of Care (Addendum)
Pt c/o of difficulty swallowing. MD aware and ordered speech evaluation. Pt is placed on dysphagia 1 for now until Speech assess the pt. Pt tolerated well with pureed diet.   Pt. right toes starting to turn pale and cold. Pain meds given x 2 today. MD aware that right foot tibial and DP pulses are absent.

## 2019-10-07 NOTE — NC FL2 (Signed)
[place] LEVEL OF CARE SCREENING TOOL     IDENTIFICATION  Patient Name: [name] Birthdate: 11/04/1925 Sex: female Admission Date (Current Location): 10/03/2019  [place] and [place] Number:  Engineering geologist and Address:  [facility], [address] [place], [place] [address]      Provider Number: [id number]  Attending Physician Name and Address:  Katha Cabal, MD  Relative Name and Phone Number:  Thede III,John Pandora Leiter) [phone]    Current Level of Care: Hospital Recommended Level of Care: [place] Prior Approval Number:    Date Approved/Denied:   PASRR Number: [id number]  Discharge Plan: SNF    Current Diagnoses: Patient Active Problem List   Diagnosis Date Noted  . Ischemia of right lower extremity 10/03/2019  . Ischemia of extremity 07/31/2019  . Proteinuria 04/23/2019  . Atherosclerosis of native arteries of the extremities with ulceration ([place]) 03/01/2019  . Chronic vertebral fracture due to osteoporosis ([place]) 05/20/2018  . Closed T10 fracture ([place]) 05/20/2018  . Gout 03/27/2018  . Hypothyroidism 03/27/2018  . Subdural hematoma ([place]) 03/08/2018  . Abnormal SPEP 12/07/2017  . History of vitamin D deficiency 11/30/2017  . Hyperlipidemia associated with type 2 diabetes mellitus ([place]) 11/30/2017  . PAD (peripheral artery disease) ([place]) 07/19/2017  . Leg pain, lateral, left 07/19/2017  . Diabetes ([place]) 07/19/2017  . Hyperlipidemia 07/19/2017  . Essential hypertension 07/19/2017  . Mild aortic valve stenosis 06/22/2017  . Persistent atrial fibrillation ([place]) 06/22/2017  . Moderate mitral insufficiency 10/30/2015  . Chronic kidney disease, stage 3 unspecified 09/19/2014  . Type II or unspecified type diabetes mellitus with renal manifestations, not stated as uncontrolled(250.40) 11/08/2013  . Vitamin D deficiency 11/08/2013    Orientation RESPIRATION BLADDER Height & Weight     Self,  Place    Incontinent Weight: 126 lb 1.7 oz (57.2 kg) Height:  5\' 3"  (160 cm)  BEHAVIORAL SYMPTOMS/MOOD NEUROLOGICAL BOWEL NUTRITION STATUS      Incontinent    AMBULATORY STATUS COMMUNICATION OF NEEDS Skin     Verbally Other (Comment) (some pressure wounds, floating heels)                       Personal Care Assistance Level of Assistance  Bathing, Total care, Dressing, Feeding Bathing Assistance: Maximum assistance Feeding assistance: Limited assistance Dressing Assistance: Maximum assistance Total Care Assistance: Maximum assistance   Functional Limitations Info  Hearing (impaired in both ears)          SPECIAL CARE FACTORS FREQUENCY  PT (By licensed PT), OT (By licensed OT)     PT Frequency: 5 x weekly OT Frequency: 5 x weekly            Contractures Contractures Info: Not present    Additional Factors Info  Code Status Code Status Info: full             Current Medications (10/07/2019):  This is the current hospital active medication list Current Facility-Administered Medications  Medication Dose Route Frequency Provider Last Rate Last Admin  . 0.9 %  sodium chloride infusion  250 mL Intravenous PRN Schnier, Dolores Lory, MD      . acetaminophen (TYLENOL) tablet 650 mg  650 mg Oral Q6H PRN Schnier, Dolores Lory, MD       Or  . acetaminophen (TYLENOL) suppository 650 mg  650 mg Rectal Q6H PRN Schnier, Dolores Lory, MD      . acetaminophen (TYLENOL) tablet 650 mg  650 mg Oral Q4H PRN Schnier, Dolores Lory, MD      . allopurinol (ZYLOPRIM) tablet 100 mg  100 mg Oral Daily Schnier, Dolores Lory, MD   100 mg at 10/07/19 0917  . carvedilol (COREG) tablet 3.125 mg  3.125 mg Oral BID WC Schnier, Dolores Lory, MD   3.125 mg at 10/07/19 0850  . furosemide (LASIX) tablet 20 mg  20 mg Oral BID Delana Meyer Dolores Lory, MD   20 mg at 10/07/19 0850  . gabapentin (NEURONTIN) capsule 100-200 mg  100-200 mg Oral QHS PRN Schnier, Dolores Lory, MD      . heparin ADULT infusion 100 units/mL (25000  units/2109mL sodium chloride 0.45%)  1,000 Units/hr Intravenous Continuous Schnier, Dolores Lory, MD 10 mL/hr at 10/07/19 1058 1,000 Units/hr at 10/07/19 1058  . levothyroxine (SYNTHROID) tablet 88 mcg  88 mcg Oral QAC breakfast Schnier, Dolores Lory, MD   88 mcg at 10/07/19 0548  . lisinopril (ZESTRIL) tablet 5 mg  5 mg Oral Daily Schnier, Dolores Lory, MD   5 mg at 10/07/19 0850  . morphine 2 MG/ML injection 2 mg  2 mg Intravenous Q1H PRN Schnier, Dolores Lory, MD   2 mg at 10/07/19 0108  . ondansetron (ZOFRAN) injection 4 mg  4 mg Intravenous Q6H PRN Schnier, Dolores Lory, MD      . oxyCODONE (Oxy IR/ROXICODONE) immediate release tablet 5-10 mg  5-10 mg Oral Q4H PRN Schnier, Dolores Lory, MD   10 mg at 10/07/19 1209  . potassium chloride SA (KLOR-CON) CR tablet 20 mEq  20 mEq Oral Daily Schnier, Dolores Lory, MD   20 mEq at 10/07/19 0850  . sodium chloride flush (NS) 0.9 % injection 3 mL  3 mL Intravenous Q12H Schnier, Dolores Lory, MD   3 mL at 10/04/19 2200  . sodium chloride flush (NS) 0.9 % injection 3 mL  3 mL Intravenous PRN Schnier, Dolores Lory, MD         Discharge Medications: Please see discharge summary for a list of discharge medications.  Relevant Imaging Results:  Relevant Lab Results:   Additional Information SS3 [id number]  Meriel Flavors, LCSW

## 2019-10-07 NOTE — Consult Note (Signed)
[place] for Heparin drip Indication: Lower Extremity Ischemia  Allergies  Allergen Reactions  . Sulfa Antibiotics Hives    Patient Measurements: Height: 5\' 3"  (160 cm) Weight: 57.2 kg (126 lb 1.7 oz) IBW/kg (Calculated) : 52.4 Heparin Dosing Weight: 53.1kg  Vital Signs: Temp: 98.8 F (37.1 C) (06/27 0420) Temp Source: Oral (06/27 0420) BP: 107/46 (06/27 0420) Pulse Rate: 48 (06/27 0420)  Labs: Recent Labs    10/04/19 1214 10/05/19 0620 10/05/19 0620 10/05/19 1847 10/06/19 0425 10/07/19 0543  HGB  --  9.8*   < >  --  9.0* 8.0*  HCT  --  29.8*  --   --  27.8* 24.4*  PLT  --  233  --   --  242 230  HEPARINUNFRC   < >  --   --  0.46 0.66 0.69  CREATININE  --  1.68*  --   --  1.84*  --    < > = values in this interval not displayed.    Estimated Creatinine Clearance: 16.1 mL/min (A) (by C-G formula based on SCr of 1.84 mg/dL (H)).   Medical History: Past Medical History:  Diagnosis Date  . A-fib ([place])   . CHF (congestive heart failure) ([place])   . Diabetes mellitus without complication ([place])   . Hypertension     Assessment: 86 yo female with limb threatening ischemia. Patient s/p mechanical thrombectomy. Was on Aggrastat, now stopped. Pharmacy has been consulted to restart a heparin drip.  Goal of Therapy:  Heparin level 0.3-0.7 units/ml Monitor platelets by anticoagulation protocol: Yes   Plan:  6/27 0425 HL 0.69 therapeutic. H/H worse, PLTs OK.  Will continue heparin drip at current rate (1000 units/hr). Recheck HL and CBC daily while on heparin drip.    Pharmacy will continue to follow.   Ena Dawley, PharmD Clinical Pharmacist 10/07/2019 7:02 AM

## 2019-10-07 NOTE — TOC Progression Note (Signed)
Transition of Care [facility]) - Progression Note    Patient Details  Name: Dayrin Stallone Sorbello MRN: [id number] Date of Birth: 1926-03-25  Transition of Care  [facility]) CM/SW [place], LCSW Phone Number: 10/07/2019, 1:28 PM  Clinical Narrative:    PT and OT recommending SNF placement, FL2 with PASRR# completed  [employer] auth started through portal        Expected Discharge Plan and Services                                                 Social Determinants of Health (SDOH) Interventions    Readmission Risk Interventions No flowsheet data found.

## 2019-10-07 NOTE — Progress Notes (Signed)
3 Days Post-Op   Subjective/Chief Complaint: States right foot is about the same. Pain Controlled with current regimen.  Some difficulty being OOB to chair yesterday with PT. Patient states she is tired. Also complains of difficulty swallowing. Denies coughing.   Objective: Vital signs in last 24 hours: Temp:  [98.6 F (37 C)-98.8 F (37.1 C)] 98.8 F (37.1 C) (06/27 0420) Pulse Rate:  [48-59] 48 (06/27 0420) Resp:  [20] 20 (06/27 0420) BP: (107-120)/(46-50) 107/46 (06/27 0420) SpO2:  [92 %-98 %] 96 % (06/27 0420) Last BM Date: 10/02/19  Intake/Output from previous day: 06/26 0701 - 06/27 0700 In: 95.7 [I.V.:95.7] Out: 500 [Urine:500] Intake/Output this shift: No intake/output data recorded.  General appearance: alert and no distress Resp: clear to auscultation bilaterally Cardio: regular rate and rhythm Extremities: RIGHT Lower extremity: warm to ankle, foot cooler, more cyanosis noted than on prior exam, +motor/+sensory, tender to touch.  Lab Results:  Recent Labs    10/06/19 0425 10/07/19 0543  WBC 10.4 9.2  HGB 9.0* 8.0*  HCT 27.8* 24.4*  PLT 242 230   BMET Recent Labs    10/05/19 0620 10/06/19 0425  NA 139 138  K 3.8 3.8  CL 106 103  CO2 24 25  GLUCOSE 142* 117*  BUN 41* 42*  CREATININE 1.68* 1.84*  CALCIUM 8.7* 9.0   PT/INR No results for input(s): LABPROT, INR in the last 72 hours. ABG No results for input(s): PHART, HCO3 in the last 72 hours.  Invalid input(s): PCO2, PO2  Studies/Results: No results found.  Anti-infectives: Anti-infectives (From admission, onward)   Start     Dose/Rate Route Frequency Ordered Stop   10/04/19 0000  ceFAZolin (ANCEF) IVPB 1 g/50 mL premix  Status:  Discontinued       Note to Pharmacy: To be given in specials   1 g 100 mL/hr over 30 Minutes Intravenous  Once 10/03/19 1711 10/04/19 2703      Assessment/Plan: s/p Procedure(s): Lower Extremity Angiography (Right) Percutaneous transluminal angioplastyto  4 mm with Lutonix drug-eluting balloon mid to distal popliteal to 4 mm  Percutaneous transluminal angioplastyto 3 mm right anterior tibial. Percutaneous transluminal angioplasty to 2.5 mm right peroneal artery. Mechanical thrombectomy of the right SFA and popliteal using the penumbra CAT 6 device in association with 10 mg of TPA.  Right LE Ischemia: Continue Heparin gtt for now; no further revascularization options- if pain controlled may consider dispo to SNF; otherwise may require amputation while in house.  Will Obtain Speech/Swallow eval for difficulty swallowing; Pureed diet for now.  OOB as tolerated with PT    LOS: 4 days    [name] A 10/07/2019

## 2019-10-08 ENCOUNTER — Inpatient Hospital Stay: Payer: Medicare Other

## 2019-10-08 LAB — CBC
HCT: 20.1 % — ABNORMAL LOW (ref 36.0–46.0)
HCT: 28 % — ABNORMAL LOW (ref 36.0–46.0)
Hemoglobin: 6.6 g/dL — ABNORMAL LOW (ref 12.0–15.0)
Hemoglobin: 9 g/dL — ABNORMAL LOW (ref 12.0–15.0)
MCH: 30.7 pg (ref 26.0–34.0)
MCH: 30.2 pg (ref 26.0–34.0)
MCHC: 32.8 g/dL (ref 30.0–36.0)
MCHC: 32.1 g/dL (ref 30.0–36.0)
MCV: 93.5 fL (ref 80.0–100.0)
MCV: 94 fL (ref 80.0–100.0)
Platelets: 281 10*3/uL (ref 150–400)
Platelets: 267 10*3/uL (ref 150–400)
RBC: 2.15 MIL/uL — ABNORMAL LOW (ref 3.87–5.11)
RBC: 2.98 MIL/uL — ABNORMAL LOW (ref 3.87–5.11)
RDW: 13.5 % (ref 11.5–15.5)
RDW: 13.5 % (ref 11.5–15.5)
WBC: 10.7 10*3/uL — ABNORMAL HIGH (ref 4.0–10.5)
WBC: 10.9 10*3/uL — ABNORMAL HIGH (ref 4.0–10.5)
nRBC: 0 % (ref 0.0–0.2)
nRBC: 0 % (ref 0.0–0.2)

## 2019-10-08 LAB — HEPARIN LEVEL (UNFRACTIONATED): Heparin Unfractionated: 0.44 IU/mL (ref 0.30–0.70)

## 2019-10-08 NOTE — Progress Notes (Signed)
OT Cancellation Note  Patient Details Name: [name] MRN: [id number] DOB: September 21, 1925   Cancelled Treatment:    Reason Eval/Treat Not Completed: Medical issues which prohibited therapy. Chart reviewed. Pt HR in high 40's to low 50's today. Also noted STAT Head CT pending 2/2 R side weakness and dysphagia. Will hold OT tx this date and re-attempt next date pending POC and medical appropriateness.   Jeni Salles, MPH, MS, OTR/L ascom [phone] 10/08/19, 3:40 PM

## 2019-10-08 NOTE — Consult Note (Signed)
[facility] for Heparin drip Indication: Lower Extremity Ischemia  Allergies  Allergen Reactions  . Sulfa Antibiotics Hives   Patient Measurements: Height: 5\' 3"  (160 cm) Weight: 57.2 kg (126 lb 1.7 oz) IBW/kg (Calculated) : 52.4 Heparin Dosing Weight: 53.1kg  Vital Signs: Temp: 98 F (36.7 C) (06/28 0449) Temp Source: Oral (06/28 0449) BP: 116/85 (06/28 0449) Pulse Rate: 53 (06/28 0449)  Labs: Recent Labs    10/05/19 0620 10/05/19 1847 10/06/19 0425 10/06/19 0425 10/07/19 0543 10/08/19 0428  HGB 9.8*  --  9.0*   < > 8.0* 6.6*  HCT 29.8*  --  27.8*  --  24.4* 20.1*  PLT 233  --  242  --  230 281  HEPARINUNFRC  --    < > 0.66  --  0.69 0.44  CREATININE 1.68*  --  1.84*  --   --   --    < > = values in this interval not displayed.    Estimated Creatinine Clearance: 16.1 mL/min (A) (by C-G formula based on SCr of 1.84 mg/dL (H)).   Medical History: Past Medical History:  Diagnosis Date  . A-fib ([place])   . CHF (congestive heart failure) ([place])   . Diabetes mellitus without complication ([place])   . Hypertension     Assessment: 86 yo female with limb threatening ischemia. Patient s/p mechanical thrombectomy. Was on Aggrastat, now stopped. Pharmacy has been consulted to restart a heparin drip.  Goal of Therapy:  Heparin level 0.3-0.7 units/ml Monitor platelets by anticoagulation protocol: Yes   Plan:  6/28 0428 HL 0.44 therapeutic. H/H worse, PLTs OK.  Will continue heparin drip at current rate (1000 units/hr). Recheck HL and CBC daily while on heparin drip.    Pharmacy will continue to follow.   Ena Dawley, PharmD Clinical Pharmacist 10/08/2019 5:31 AM

## 2019-10-08 NOTE — Care Management Important Message (Signed)
Important Message  Patient Details  Name: [name] MRN: [id number] Date of Birth: [birth year]   Medicare Important Message Given:  Yes     [name] 10/08/2019, 11:46 AM

## 2019-10-08 NOTE — TOC Initial Note (Signed)
Transition of Care [facility]) - Initial/Assessment Note    Patient Details  Name: [name] MRN: [id number] Date of Birth: [birth year]  [facility] CM/SW Contact:    [name], RN Phone Number: 10/08/2019, 3:45 PM  Clinical Narrative:                 Patient admitted for atherosclerosis of arteries of the lower extremities.  S/p angioplasty   Patient lives in her own home.  3 daughters rotate providing care in the home  Patient has RW, rollator, cane bsc, shower seat, and WC, and Power WC in the home  PCP [employer] - denies issues obtaining medications  Family provides transportation.   PT has assessed patient and recommending SNF.  Patient and her oldest daughter [name] [phone] in agreement.  Bed offers presented.  They would like to stay local, and accepted bed at [facility].    Patient not medically ready for discharge.  Will need insurance auth for SNF  Expected Discharge Plan: [place] Barriers to Discharge: Continued Medical Work up   Patient Goals and CMS Choice        Expected Discharge Plan and Services Expected Discharge Plan: [place]   Discharge Planning Services: CM Consult   Living arrangements for the past 2 months: Single Family Home                                      Prior Living Arrangements/Services Living arrangements for the past 2 months: Single Family Home Lives with:: Adult Children              Current home services: DME    Activities of Daily Living Home Assistive Devices/Equipment: Environmental consultant (specify type) ADL Screening (condition at time of admission) Patient's cognitive ability adequate to safely complete daily activities?: Yes Is the patient deaf or have difficulty hearing?: Yes Does the patient have difficulty seeing, even when wearing glasses/contacts?: No Does the patient have difficulty concentrating, remembering, or  making decisions?: No Patient able to express need for assistance with ADLs?: Yes Does the patient have difficulty dressing or bathing?: No Independently performs ADLs?: Yes (appropriate for developmental age) Does the patient have difficulty walking or climbing stairs?: Yes Weakness of Legs: None Weakness of Arms/Hands: None  Permission Sought/Granted                  Emotional Assessment              Admission diagnosis:  Ischemia of right lower extremity [I99.8] Patient Active Problem List   Diagnosis Date Noted  . Ischemia of right lower extremity 10/03/2019  . Ischemia of extremity 07/31/2019  . Proteinuria 04/23/2019  . Atherosclerosis of native arteries of the extremities with ulceration ([place]) 03/01/2019  . Chronic vertebral fracture due to osteoporosis ([place]) 05/20/2018  . Closed T10 fracture ([place]) 05/20/2018  . Gout 03/27/2018  . Hypothyroidism 03/27/2018  . Subdural hematoma ([place]) 03/08/2018  . Abnormal SPEP 12/07/2017  . History of vitamin D deficiency 11/30/2017  . Hyperlipidemia associated with type 2 diabetes mellitus ([place]) 11/30/2017  . PAD (peripheral artery disease) ([place]) 07/19/2017  . Leg pain, lateral, left 07/19/2017  . Diabetes ([place]) 07/19/2017  . Hyperlipidemia 07/19/2017  . Essential hypertension 07/19/2017  . Mild aortic valve stenosis 06/22/2017  . Persistent atrial fibrillation ([place]) 06/22/2017  . Moderate mitral insufficiency 10/30/2015  .  Chronic kidney disease, stage 3 unspecified 09/19/2014  . Type II or unspecified type diabetes mellitus with renal manifestations, not stated as uncontrolled(250.40) 11/08/2013  . Vitamin D deficiency 11/08/2013   PCP:  Sofie Hartigan, MD Pharmacy:   [facility], [place] - [place] [address] Phone: [phone] Fax: [phone]     Social Determinants of Health (SDOH) Interventions    Readmission Risk Interventions No flowsheet data found.

## 2019-10-08 NOTE — Progress Notes (Signed)
[facility] Daily Progress Note  Subjective: 10/04/19: 1. Introduction catheter intorightlower extremity 3rd order catheter placement with additional third order 2.Contrast injectionrightlower extremity for distal runoff  3. Percutaneous transluminal angioplastyto 4 mm with Lutonix drug-eluting balloon mid to distal popliteal to 4 mm4. Percutaneous transluminal angioplastyto 3 mm right anterior tibial.  5. Percutaneous transluminal angioplasty to 2.5 mm right peroneal artery. 6.Mechanical thrombectomy of the right SFA and popliteal using the penumbra CAT 6 device in association with 10 mg of TPA. 7.Star close closureleftcommon femoral arteriotomy  Continued pain to the right calf and foot.  Objective: Vitals:   10/07/19 1250 10/07/19 2021 10/08/19 0449 10/08/19 0500  BP: 111/74 (!) 138/45 116/85   Pulse: (!) 51 (!) 51 (!) 53   Resp: 17 18 16    Temp: 98 F (36.7 C) 99.2 F (37.3 C) 98 F (36.7 C)   TempSrc: Oral Oral Oral   SpO2: 94% 97% 99%   Weight:    57.2 kg  Height:        Intake/Output Summary (Last 24 hours) at 10/08/2019 1127 Last data filed at 10/08/2019 0900 Gross per 24 hour  Intake 326.71 ml  Output 1250 ml  Net -923.29 ml   Physical Exam: A&Ox3, NAD CV: RRR Pulmonary: CTA Bilaterally Abdomen: Soft, Nontender, Nondistended Vascular:             Right lower extremity: Thigh soft.  Calf soft.  Extremities warm distally in toes.  Foot is warm.  Unable to palpate pedal pulses.   Laboratory: CBC    Component Value Date/Time   WBC 10.9 (H) 10/08/2019 0953   HGB 9.0 (L) 10/08/2019 0953   HCT 28.0 (L) 10/08/2019 0953   PLT 267 10/08/2019 0953   BMET    Component Value Date/Time   NA 138 10/06/2019 0425   NA 141 08/03/2011 0729   K 3.8 10/06/2019 0425   K 3.6 08/03/2011 0729   CL 103 10/06/2019 0425   CL 105 08/03/2011 0729   CO2  25 10/06/2019 0425   CO2 26 08/03/2011 0729   GLUCOSE 117 (H) 10/06/2019 0425   GLUCOSE 149 (H) 08/03/2011 0729   BUN 42 (H) 10/06/2019 0425   BUN 16 08/03/2011 0729   CREATININE 1.84 (H) 10/06/2019 0425   CREATININE 1.07 08/03/2011 0729   CALCIUM 9.0 10/06/2019 0425   CALCIUM 9.5 08/03/2011 0729   GFRNONAA 23 (L) 10/06/2019 0425   GFRNONAA 48 (L) 08/03/2011 0729   GFRAA 27 (L) 10/06/2019 0425   GFRAA 56 (L) 08/03/2011 0729   Assessment/Planning: The patient is a 86 year old female who presented with right lower extremity ischemia status post angiogram with intervention - POD#4  1) H&H stable. Continue Heparin drip.  2) Unable to palpate pedal pulses however the foot is warm.  We will continue to treat with blood thinners and reassess in a few days. 3) PT / OT / Ambulation  Discussed with Dr. Eber Hong Kentrell Hallahan PA-C 10/08/2019 11:27 AM

## 2019-10-08 NOTE — Evaluation (Signed)
Clinical/Bedside Swallow Evaluation Patient Details  Name: [name] MRN: [id number] Date of Birth: March 07, 1926  Today's Date: 10/08/2019 Time: SLP Start Time (ACUTE ONLY): 1010 SLP Stop Time (ACUTE ONLY): 1100 SLP Time Calculation (min) (ACUTE ONLY): 50 min  Past Medical History:  Past Medical History:  Diagnosis Date  . A-fib ([place])   . CHF (congestive heart failure) ([place])   . Diabetes mellitus without complication ([place])   . Hypertension    Past Surgical History:  Past Surgical History:  Procedure Laterality Date  . CHOLECYSTECTOMY    . LOWER EXTREMITY ANGIOGRAPHY Right 08/12/2017   Procedure: LOWER EXTREMITY ANGIOGRAPHY;  Surgeon: Katha Cabal, MD;  Location: [place];  Service: Cardiovascular;  Laterality: Right;  . LOWER EXTREMITY ANGIOGRAPHY Right 06/13/2018   Procedure: LOWER EXTREMITY ANGIOGRAPHY;  Surgeon: Katha Cabal, MD;  Location: [facility];  Service: Cardiovascular;  Laterality: Right;  . LOWER EXTREMITY ANGIOGRAPHY Right 03/01/2019   Procedure: LOWER EXTREMITY ANGIOGRAPHY;  Surgeon: Katha Cabal, MD;  Location: [place] [facility];  Service: Cardiovascular;  Laterality: Right;  . LOWER EXTREMITY ANGIOGRAPHY Right 07/31/2019   Procedure: LOWER EXTREMITY ANGIOGRAPHY;  Surgeon: Katha Cabal, MD;  Location: [place] [facility];  Service: Cardiovascular;  Laterality: Right;  . LOWER EXTREMITY ANGIOGRAPHY Right 10/04/2019   Procedure: Lower Extremity Angiography;  Surgeon: Katha Cabal, MD;  Location: [facility];  Service: Cardiovascular;  Laterality: Right;   HPI:  Pt is a 86 yo female pt admitted for limb threatening ischemia.  PMH includes: Afib, CHF, DM, HTN, HLD; also c/o s/s of Reflux.  Vascular Surgery is following for Atherosclerotic occlusive disease bilateral lower extremities with rest pain and ischemia of the right lower extremity.  Prior to this admit, pt lived at home able to do some general ADLs  including manage medications independently, and a sponge bath. Family reported that children take turns staying overnight with the pt and that "someone is typically there with the pt most of the day but she might be home for an hour or two by herself.".  Son reported pt was "not a big eater anymore for the past few years".  No recent CXR/imaging; BS clear per MD notes.   Assessment / Plan / Recommendation Clinical Impression  Pt appears to present w/ adequate oropharyngeal phase swallowing function w/ No oropharyngeal phase dysphagia appreciated; no neuromuscular swallowing deficits. Pt is at reduced risk for aspiration when following general aspiration precautions w/ oral intake. Pt wears full set of Dentures; eats w/ her Dentures. Pt supported and given cues on positioning fully upright for safer oral intake. She then consumed trials of thin liquids, purees, and solids w/ no overt clinical s/s of aspiration noted; clear vocal quality b/t trials. Respiratory effort remained calm during/post trials. She utilized sips of water/thins to swallow pills w/ NSG while in room w/ no swallowing difficulties noted. Oral phase appeared [facility] for bolus management and timely swallowing except for min increased time for full mastication/clearing of soft solids -- applesauce was used to soften the cookie. Oral clearing achieved b/t trials. OM exam was [facility] for oral clearing; lingual/labial movements. No unilateral weakness. Pt described and endorsed potential s/s of REFLUX including the Globus feelings when eating solids; also Belching noted post po trials given. Any Reflux behavior and/or Esophageal dysmotility can impact comfort of swallowing and the pharyngeal phase of swallowing. Pt and Son stated these "feelings" have occurred before but denied any consistency to them.  Pt has not had any h/o pneumonia or pulmonary decline per chart notes/report. She eats "less" at meals in recent months/years per Son's report. Discussed  general aging impact on Esophageal motility and oral intake in general; then Reflux precautions and gave Handouts. Recommend a more mech soft diet w/ Chopped meats, moistened foods w/ thin liquids; general Reflux and aspiration precautions.Recommend positioning and less talking/distraction during meals to allow pt to focus on oral intake. Recommend Pills Whole in Puree IF any difficulty swallowing w/ liquids. These precautions were discussed w/ pt/Son/NSG; posted in chart for NSG. No further skilled ST services indicated at this time. NSG to reconsult if any new needs while admitted. NSG updated.  SLP Visit Diagnosis: Dysphagia, unspecified (R13.10) (s/s of Esophageal phase dysmotility)    Aspiration Risk   (reduced following general precautions)    Diet Recommendation  Mech Soft diet (more chopped meats w/ gravies, moist foods cut smaller); Thin liquids. General REFLUX and aspiration precautions. Support at meals w/ positioning and remain Upright for ~30 mins post meals.  Medication Administration: Whole meds with liquid (or Whole in Puree if needed for ease of swallowing)    Other  Recommendations Recommended Consults: Consider GI evaluation;Consider esophageal assessment (Dietician f/u as needed) Oral Care Recommendations: Oral care BID;Oral care before and after PO;Staff/trained caregiver to provide oral care (Dentures) Other Recommendations:  (n/a)   Follow up Recommendations None      Frequency and Duration  (n/a)   (n/a)       Prognosis Prognosis for Safe Diet Advancement: Good      Swallow Study   General Date of Onset: 10/05/19 HPI: Pt is a 86 yo female pt admitted for limb threatening ischemia.  PMH includes: Afib, CHF, DM, HTN, HLD; also c/o s/s of Reflux.  Vascular Surgery is following for Atherosclerotic occlusive disease bilateral lower extremities with rest pain and ischemia of the right lower extremity.  Prior to this admit, pt lived at home able to do some general ADLs  including manage medications independently, and a sponge bath. Family reported that children take turns staying overnight with the pt and that "someone is typically there with the pt most of the day but she might be home for an hour or two by herself.".  Son reported pt was "not a big eater anymore for the past few years".  No recent CXR/imaging; BS clear per MD notes. Type of Study: Bedside Swallow Evaluation Previous Swallow Assessment: none  Diet Prior to this Study: Thin liquids;Dysphagia 1 (puree) Temperature Spikes Noted: No (wbc 10.9) Respiratory Status: Room air History of Recent Intubation: No Behavior/Cognition: Alert;Cooperative;Pleasant mood Oral Cavity Assessment: Within Functional Limits Oral Care Completed by SLP: Yes Oral Cavity - Dentition: Dentures, top;Dentures, bottom Vision: Functional for self-feeding Self-Feeding Abilities: Able to feed self;Needs set up Patient Positioning: Upright in bed (min support w/ positioning) Baseline Vocal Quality: Normal Volitional Cough: Strong Volitional Swallow: Able to elicit    Oral/Motor/Sensory Function Overall Oral Motor/Sensory Function: Within functional limits   Ice Chips Ice chips: Within functional limits Presentation: Spoon (fed; 2 trials)   Thin Liquid Thin Liquid: Within functional limits Presentation: Straw;Self Fed (~4+ ozs total) Other Comments: she utilized sips of water/thins to swallow pills w/ NSG while in room    Nectar Thick Nectar Thick Liquid: Not tested   Honey Thick Honey Thick Liquid: Not tested   Puree Puree: Within functional limits Presentation: Spoon;Self Fed (supported; 6 trials)   Solid     Solid: Within functional  limits (grossly) Presentation: Self Fed;Spoon (6 trials)        Orinda Kenner, MS, CCC-SLP Misbah Hornaday 10/08/2019,2:20 PM

## 2019-10-08 NOTE — Progress Notes (Deleted)
PT Cancellation Note  Patient Details Name: [name] MRN: [id number] DOB: 19-Jul-1925   Cancelled Treatment:    Reason Eval/Treat Not Completed: Other (comment). Pt with decreased Hgb at 6.6 this date. Will hold off on exertional activity at this time and re-attempt when medically cleared.   Emmamae Mcnamara 10/08/2019, 10:43 AM  Greggory Stallion, PT, DPT [phone]

## 2019-10-09 ENCOUNTER — Other Ambulatory Visit (INDEPENDENT_AMBULATORY_CARE_PROVIDER_SITE_OTHER): Payer: Self-pay | Admitting: Vascular Surgery

## 2019-10-09 LAB — CBC
HCT: 29.6 % — ABNORMAL LOW (ref 36.0–46.0)
Hemoglobin: 9.9 g/dL — ABNORMAL LOW (ref 12.0–15.0)
MCH: 30.5 pg (ref 26.0–34.0)
MCHC: 33.4 g/dL (ref 30.0–36.0)
MCV: 91.1 fL (ref 80.0–100.0)
Platelets: 333 10*3/uL (ref 150–400)
RBC: 3.25 MIL/uL — ABNORMAL LOW (ref 3.87–5.11)
RDW: 13.4 % (ref 11.5–15.5)
WBC: 10.9 10*3/uL — ABNORMAL HIGH (ref 4.0–10.5)
nRBC: 0 % (ref 0.0–0.2)

## 2019-10-09 LAB — BASIC METABOLIC PANEL
Anion gap: 11 (ref 5–15)
BUN: 40 mg/dL — ABNORMAL HIGH (ref 8–23)
CO2: 29 mmol/L (ref 22–32)
Calcium: 9.7 mg/dL (ref 8.9–10.3)
Chloride: 98 mmol/L (ref 98–111)
Creatinine, Ser: 1.92 mg/dL — ABNORMAL HIGH (ref 0.44–1.00)
GFR calc Af Amer: 26 mL/min — ABNORMAL LOW (ref >60)
GFR calc non Af Amer: 22 mL/min — ABNORMAL LOW (ref >60)
Glucose, Bld: 131 mg/dL — ABNORMAL HIGH (ref 70–99)
Potassium: 4.3 mmol/L (ref 3.5–5.1)
Sodium: 138 mmol/L (ref 135–145)

## 2019-10-09 LAB — HEPARIN LEVEL (UNFRACTIONATED): Heparin Unfractionated: 0.54 IU/mL (ref 0.30–0.70)

## 2019-10-09 MED ORDER — CHLORHEXIDINE GLUCONATE CLOTH 2 % EX PADS: 6.0000 | MEDICATED_PAD | Freq: Once | CUTANEOUS | Status: DC

## 2019-10-09 MED ORDER — HEPARIN (PORCINE) 25000 UT/250ML-% IV SOLN
1000.0000 [IU]/h | INTRAVENOUS | Status: DC
  Administered 2019-10-09: 1000 [IU]/h via INTRAVENOUS

## 2019-10-09 MED ORDER — MORPHINE SULFATE (PF) 4 MG/ML IV SOLN
4.0000 mg | INTRAVENOUS | Status: DC | PRN
  Administered 2019-10-10: 4 mg via INTRAVENOUS
  Filled 2019-10-09: qty 1

## 2019-10-09 MED ORDER — SODIUM CHLORIDE 0.9 % IV SOLN: INTRAVENOUS | Status: AC

## 2019-10-09 NOTE — Consult Note (Signed)
[facility] for Heparin drip Indication: Lower Extremity Ischemia  Allergies  Allergen Reactions  . Sulfa Antibiotics Hives   Patient Measurements: Height: 5\' 3"  (160 cm) Weight: 57.2 kg (126 lb 1.7 oz) IBW/kg (Calculated) : 52.4 Heparin Dosing Weight: 53.1kg  Vital Signs: Temp: 98.6 F (37 C) (06/29 1201) Temp Source: Oral (06/29 1201) BP: 114/63 (06/29 1201) Pulse Rate: 78 (06/29 1201)  Labs: Recent Labs    10/07/19 0543 10/07/19 0543 10/08/19 0428 10/08/19 0428 10/08/19 0953 10/09/19 0443  HGB 8.0*   < > 6.6*   < > 9.0* 9.9*  HCT 24.4*   < > 20.1*  --  28.0* 29.6*  PLT 230   < > 281  --  267 333  HEPARINUNFRC 0.69  --  0.44  --   --  0.54  CREATININE  --   --   --   --   --  1.92*   < > = values in this interval not displayed.    Estimated Creatinine Clearance: 15.5 mL/min (A) (by C-G formula based on SCr of 1.92 mg/dL (H)).   Medical History: Past Medical History:  Diagnosis Date  . A-fib ([place])   . CHF (congestive heart failure) ([place])   . Diabetes mellitus without complication ([place])   . Hypertension     Assessment: 86 yo female with limb threatening ischemia. Patient s/p mechanical thrombectomy. Was on Aggrastat, now stopped. Pharmacy has been consulted to restart a heparin drip.  Goal of Therapy:  Heparin level 0.3-0.7 units/ml Monitor platelets by anticoagulation protocol: Yes   Plan:  Heparin to restart at 1600. Will continue current rate and will recheck HL in 8 hours, CBC stable will continue to monitor.  Pharmacy will continue to follow.   Oswald Hillock, PharmD, BCPS Clinical Pharmacist 10/09/2019 3:45 PM

## 2019-10-09 NOTE — Consult Note (Signed)
[facility] for Heparin drip Indication: Lower Extremity Ischemia  Allergies  Allergen Reactions  . Sulfa Antibiotics Hives   Patient Measurements: Height: 5\' 3"  (160 cm) Weight: 57.2 kg (126 lb 1.7 oz) IBW/kg (Calculated) : 52.4 Heparin Dosing Weight: 53.1kg  Vital Signs: Temp: 97.5 F (36.4 C) (06/29 0443) Temp Source: Oral (06/29 0443) BP: 136/64 (06/29 0443) Pulse Rate: 55 (06/29 0443)  Labs: Recent Labs    10/07/19 0543 10/07/19 0543 10/08/19 0428 10/08/19 0428 10/08/19 0953 10/09/19 0443  HGB 8.0*   < > 6.6*   < > 9.0* 9.9*  HCT 24.4*   < > 20.1*  --  28.0* 29.6*  PLT 230   < > 281  --  267 333  HEPARINUNFRC 0.69  --  0.44  --   --  0.54  CREATININE  --   --   --   --   --  1.92*   < > = values in this interval not displayed.    Estimated Creatinine Clearance: 15.5 mL/min (A) (by C-G formula based on SCr of 1.92 mg/dL (H)).   Medical History: Past Medical History:  Diagnosis Date  . A-fib ([place])   . CHF (congestive heart failure) ([place])   . Diabetes mellitus without complication ([place])   . Hypertension     Assessment: 86 yo female with limb threatening ischemia. Patient s/p mechanical thrombectomy. Was on Aggrastat, now stopped. Pharmacy has been consulted to restart a heparin drip.  Goal of Therapy:  Heparin level 0.3-0.7 units/ml Monitor platelets by anticoagulation protocol: Yes   Plan:  06/29 @ 0400 HL 0.54 therapeutic. Will continue current rate and will recheck HL w/ am labs, CBC stable will continue to monitor.  Pharmacy will continue to follow.   Tobie Lords, PharmD Clinical Pharmacist 10/09/2019 6:01 AM

## 2019-10-09 NOTE — Progress Notes (Signed)
OT Cancellation Note  Patient Details Name: Alijah Hyde Supan MRN: [id number] DOB: 09-Apr-1926   Cancelled Treatment:    Reason Eval/Treat Not Completed: Fatigue/lethargy limiting ability to participate. Per conversation c PT, pt tearful following recent decision for further surgery. Plan for AKA tomorrow. Will follow up pending continue at transfer orders.  Dessie Coma, M.S. OTR/L  10/09/19, 3:56 PM

## 2019-10-09 NOTE — Progress Notes (Signed)
PT Cancellation Note  Patient Details Name: [name] MRN: [id number] DOB: [birth year]   Cancelled Treatment:    Reason Eval/Treat Not Completed: Other (comment) Pt very upset upon entering room due to upcoming surgery for tomorrow. Provided emotional support to patient and gave well wishes. Pt did want therapy at this time. Will continue follow for acute needs.   Vale Haven 10/09/2019, 4:16 PM

## 2019-10-09 NOTE — Progress Notes (Signed)
[facility] Daily Progress Note   Subjective: 10/04/19: 1. Introduction catheter intorightlower extremity 3rd order catheter placement with additional third order 2.Contrast injectionrightlower extremity for distal runoff  3. Percutaneous transluminal angioplastyto 4 mm with Lutonix drug-eluting balloon mid to distal popliteal to 4 mm4. Percutaneous transluminal angioplastyto 3 mm right anterior tibial.  5. Percutaneous transluminal angioplasty to 2.5 mm right peroneal artery. 6.Mechanical thrombectomy of the right SFA and popliteal using the penumbra CAT 6 device in association with 10 mg of TPA. 7.Star close closureleftcommon femoral arteriotomy  Patient complaining of worsening right foot pain today.  No issues overnight.  Objective: Vitals:   10/08/19 1947 10/09/19 0443 10/09/19 0732 10/09/19 1201  BP: (!) 153/51 136/64 (!) 130/50 114/63  Pulse: 90 (!) 55  78  Resp: (!) 22 18  18   Temp: 98.2 F (36.8 C) (!) 97.5 F (36.4 C)  98.6 F (37 C)  TempSrc: Oral Oral  Oral  SpO2: 98% 96%  (!) 85%  Weight:      Height:        Intake/Output Summary (Last 24 hours) at 10/09/2019 1533 Last data filed at 10/09/2019 0600 Gross per 24 hour  Intake 343.7 ml  Output 1050 ml  Net -706.3 ml   Physical Exam: A&Ox3, NAD CV: RRR Pulmonary: CTA Bilaterally Abdomen: Soft, Nontender, Nondistended Vascular: Right lower extremity:Thigh soft. Calf soft. Extremities warm distally in toes. Foot is warm. Unable to palpate pedal pulses.   Laboratory: CBC    Component Value Date/Time   WBC 10.9 (H) 10/09/2019 0443   HGB 9.9 (L) 10/09/2019 0443   HCT 29.6 (L) 10/09/2019 0443   PLT 333 10/09/2019 0443   BMET    Component Value Date/Time   NA 138 10/09/2019 0443   NA 141 08/03/2011 0729   K 4.3 10/09/2019 0443   K 3.6 08/03/2011 0729   CL 98  10/09/2019 0443   CL 105 08/03/2011 0729   CO2 29 10/09/2019 0443   CO2 26 08/03/2011 0729   GLUCOSE 131 (H) 10/09/2019 0443   GLUCOSE 149 (H) 08/03/2011 0729   BUN 40 (H) 10/09/2019 0443   BUN 16 08/03/2011 0729   CREATININE 1.92 (H) 10/09/2019 0443   CREATININE 1.07 08/03/2011 0729   CALCIUM 9.7 10/09/2019 0443   CALCIUM 9.5 08/03/2011 0729   GFRNONAA 22 (L) 10/09/2019 0443   GFRNONAA 48 (L) 08/03/2011 0729   GFRAA 26 (L) 10/09/2019 0443   GFRAA 56 (L) 08/03/2011 0729   Assessment/Planning: The patient is a 86 year old female who presented with right lower extremity ischemia status post angiogram with intervention- POD#5  1) at this time, the patient is experiencing progressively worsening right foot pain.  On exam, the foot is cooler slightly more mottled today.  Unfortunately, the patient has exhausted her endovascular and open interventions.  Recommend undergoing an above-the-knee amputation.  At this level, previous angiography has shown she will heal.  Son at bedside.  Patient and her son would like to proceed.  We will plan on OR tomorrow.  2) continue heparin until approximately 6 hours before surgery.  Seen and examined with Dr. Eber Hong Kelis Plasse PA-C 10/09/2019 3:33 PM

## 2019-10-10 ENCOUNTER — Inpatient Hospital Stay: Payer: Medicare Other | Admitting: Certified Registered"

## 2019-10-10 ENCOUNTER — Encounter: Payer: Self-pay | Admitting: Vascular Surgery

## 2019-10-10 ENCOUNTER — Encounter
Admission: EM | Disposition: A | Discharge status: Home Health | Payer: Medicare Other | Source: Ambulatory Visit | Attending: Vascular Surgery

## 2019-10-10 DIAGNOSIS — I96 Gangrene, not elsewhere classified: Secondary | ICD-10-CM

## 2019-10-10 HISTORY — PX: AMPUTATION: SHX166

## 2019-10-10 LAB — BASIC METABOLIC PANEL
Anion gap: 11 (ref 5–15)
BUN: 40 mg/dL — ABNORMAL HIGH (ref 8–23)
CO2: 30 mmol/L (ref 22–32)
Calcium: 9.5 mg/dL (ref 8.9–10.3)
Chloride: 95 mmol/L — ABNORMAL LOW (ref 98–111)
Creatinine, Ser: 1.97 mg/dL — ABNORMAL HIGH (ref 0.44–1.00)
GFR calc Af Amer: 25 mL/min — ABNORMAL LOW (ref >60)
GFR calc non Af Amer: 22 mL/min — ABNORMAL LOW (ref >60)
Glucose, Bld: 128 mg/dL — ABNORMAL HIGH (ref 70–99)
Potassium: 4.4 mmol/L (ref 3.5–5.1)
Sodium: 136 mmol/L (ref 135–145)

## 2019-10-10 LAB — MAGNESIUM: Magnesium: 2 mg/dL (ref 1.7–2.4)

## 2019-10-10 LAB — CBC
HCT: 28.2 % — ABNORMAL LOW (ref 36.0–46.0)
Hemoglobin: 9.1 g/dL — ABNORMAL LOW (ref 12.0–15.0)
MCH: 30 pg (ref 26.0–34.0)
MCHC: 32.3 g/dL (ref 30.0–36.0)
MCV: 93.1 fL (ref 80.0–100.0)
Platelets: 323 10*3/uL (ref 150–400)
RBC: 3.03 MIL/uL — ABNORMAL LOW (ref 3.87–5.11)
RDW: 13.4 % (ref 11.5–15.5)
WBC: 10.3 10*3/uL (ref 4.0–10.5)
nRBC: 0 % (ref 0.0–0.2)

## 2019-10-10 LAB — PROTIME-INR
INR: 1.1 (ref 0.8–1.2)
Prothrombin Time: 13.7 seconds (ref 11.4–15.2)

## 2019-10-10 LAB — PREPARE RBC (CROSSMATCH)

## 2019-10-10 LAB — GLUCOSE, CAPILLARY
Glucose-Capillary: 158 mg/dL — ABNORMAL HIGH (ref 70–99)
Glucose-Capillary: 135 mg/dL — ABNORMAL HIGH (ref 70–99)
Glucose-Capillary: 126 mg/dL — ABNORMAL HIGH (ref 70–99)

## 2019-10-10 LAB — HEPARIN LEVEL (UNFRACTIONATED)
Heparin Unfractionated: 0.56 IU/mL (ref 0.30–0.70)
Heparin Unfractionated: <0.1 IU/mL — ABNORMAL LOW (ref 0.30–0.70)

## 2019-10-10 SURGERY — AMPUTATION, ABOVE KNEE
Anesthesia: General | Site: Knee | Laterality: Right

## 2019-10-10 MED ORDER — EPHEDRINE SULFATE 50 MG/ML IJ SOLN
INTRAMUSCULAR | Status: DC | PRN
  Administered 2019-10-10: 25 mg via INTRAVENOUS
  Administered 2019-10-10: 20 mg via INTRAVENOUS
  Administered 2019-10-10: 25 mg via INTRAVENOUS

## 2019-10-10 MED ORDER — ONDANSETRON HCL 4 MG/2ML IJ SOLN
INTRAMUSCULAR | Status: DC | PRN
  Administered 2019-10-10: 4 mg via INTRAVENOUS

## 2019-10-10 MED ORDER — CEFAZOLIN SODIUM-DEXTROSE 1-4 GM/50ML-% IV SOLN
1.0000 g | Freq: Two times a day (BID) | INTRAVENOUS | Status: AC
  Administered 2019-10-11 – 2019-10-12 (×3): 1 g via INTRAVENOUS
  Filled 2019-10-10 (×3): qty 50

## 2019-10-10 MED ORDER — MORPHINE SULFATE (PF) 2 MG/ML IV SOLN
2.0000 mg | INTRAVENOUS | Status: DC | PRN
  Administered 2019-10-14 – 2019-10-17 (×2): 2 mg via INTRAVENOUS
  Filled 2019-10-10 (×3): qty 1

## 2019-10-10 MED ORDER — FENTANYL CITRATE (PF) 100 MCG/2ML IJ SOLN
INTRAMUSCULAR | Status: DC | PRN
  Administered 2019-10-10: 50 ug via INTRAVENOUS
  Administered 2019-10-10 (×2): 100 ug via INTRAVENOUS

## 2019-10-10 MED ORDER — ONDANSETRON HCL 4 MG/2ML IJ SOLN: 4.0000 mg | Freq: Once | INTRAMUSCULAR | Status: DC | PRN

## 2019-10-10 MED ORDER — PROPOFOL 10 MG/ML IV BOLUS
INTRAVENOUS | Status: DC | PRN
  Administered 2019-10-10 (×2): 50 mg via INTRAVENOUS

## 2019-10-10 MED ORDER — CEFAZOLIN SODIUM-DEXTROSE 1-4 GM/50ML-% IV SOLN
1.0000 g | INTRAVENOUS | Status: AC
  Administered 2019-10-10: 1 g via INTRAVENOUS
  Filled 2019-10-10: qty 50

## 2019-10-10 MED ORDER — CEFAZOLIN SODIUM-DEXTROSE 1-4 GM/50ML-% IV SOLN
INTRAVENOUS | Status: AC
  Filled 2019-10-10: qty 50

## 2019-10-10 MED ORDER — BUPIVACAINE LIPOSOME 1.3 % IJ SUSP
INTRAMUSCULAR | Status: DC | PRN
  Administered 2019-10-10: 50 mL

## 2019-10-10 MED ORDER — DEXAMETHASONE SODIUM PHOSPHATE 10 MG/ML IJ SOLN
INTRAMUSCULAR | Status: DC | PRN
  Administered 2019-10-10: 10 mg via INTRAVENOUS

## 2019-10-10 MED ORDER — SODIUM CHLORIDE 0.9 % IV SOLN: INTRAVENOUS | Status: DC | PRN

## 2019-10-10 MED ORDER — LIDOCAINE HCL (CARDIAC) PF 100 MG/5ML IV SOSY
PREFILLED_SYRINGE | INTRAVENOUS | Status: DC | PRN
  Administered 2019-10-10: 80 mg via INTRAVENOUS

## 2019-10-10 MED ORDER — PHENYLEPHRINE HCL (PRESSORS) 10 MG/ML IV SOLN
INTRAVENOUS | Status: DC | PRN
  Administered 2019-10-10 (×2): 200 ug via INTRAVENOUS

## 2019-10-10 MED ORDER — FENTANYL CITRATE (PF) 100 MCG/2ML IJ SOLN: 25.0000 ug | INTRAMUSCULAR | Status: DC | PRN

## 2019-10-10 SURGICAL SUPPLY — 49 items
BAG COUNTER SPONGE EZ (MISCELLANEOUS) ×2 IMPLANT
BLADE SAW SAG 25.4X90 (BLADE) ×3 IMPLANT
BLADE SURG SZ10 CARB STEEL (BLADE) ×3 IMPLANT
BNDG COHESIVE 4X5 TAN STRL (GAUZE/BANDAGES/DRESSINGS) ×3 IMPLANT
BNDG ELASTIC 6X5.8 VLCR NS LF (GAUZE/BANDAGES/DRESSINGS) ×3 IMPLANT
BNDG GAUZE 4.5X4.1 6PLY STRL (MISCELLANEOUS) ×3 IMPLANT
CANISTER SUCT 1200ML W/VALVE (MISCELLANEOUS) ×3 IMPLANT
CANISTER WOUND CARE 500ML ATS (WOUND CARE) IMPLANT
CHLORAPREP W/TINT 26 (MISCELLANEOUS) ×3 IMPLANT
COUNTER SPONGE BAG EZ (MISCELLANEOUS) ×1
COVER WAND RF STERILE (DRAPES) ×3 IMPLANT
DERMABOND ADVANCED (GAUZE/BANDAGES/DRESSINGS) ×2
DERMABOND ADVANCED .7 DNX12 (GAUZE/BANDAGES/DRESSINGS) ×1 IMPLANT
DRAPE INCISE IOBAN 66X45 STRL (DRAPES) IMPLANT
DRSG GAUZE FLUFF 36X18 (GAUZE/BANDAGES/DRESSINGS) ×3 IMPLANT
DRSG VAC ATS MED SENSATRAC (GAUZE/BANDAGES/DRESSINGS) IMPLANT
ELECT CAUTERY BLADE 6.4 (BLADE) ×3 IMPLANT
ELECT REM PT RETURN 9FT ADLT (ELECTROSURGICAL) ×3
ELECTRODE REM PT RTRN 9FT ADLT (ELECTROSURGICAL) ×1 IMPLANT
GLOVE BIO SURGEON STRL SZ7 (GLOVE) ×3 IMPLANT
GLOVE INDICATOR 7.5 STRL GRN (GLOVE) ×3 IMPLANT
GLOVE SURG SYN 8.0 (GLOVE) ×3 IMPLANT
GOWN STRL REUS W/ TWL LRG LVL3 (GOWN DISPOSABLE) ×2 IMPLANT
GOWN STRL REUS W/ TWL XL LVL3 (GOWN DISPOSABLE) ×1 IMPLANT
GOWN STRL REUS W/TWL LRG LVL3 (GOWN DISPOSABLE) ×4
GOWN STRL REUS W/TWL XL LVL3 (GOWN DISPOSABLE) ×2
HANDLE YANKAUER SUCT BULB TIP (MISCELLANEOUS) ×3 IMPLANT
KIT TURNOVER KIT A (KITS) ×3 IMPLANT
NEEDLE HYPO 21X1.5 SAFETY (NEEDLE) ×3 IMPLANT
NS IRRIG 500ML POUR BTL (IV SOLUTION) ×3 IMPLANT
PACK EXTREMITY (MISCELLANEOUS) ×3 IMPLANT
PAD PREP 24X41 OB/GYN DISP (PERSONAL CARE ITEMS) ×3 IMPLANT
SPONGE LAP 18X18 RF (DISPOSABLE) ×6 IMPLANT
STAPLER SKIN PROX 35W (STAPLE) ×3 IMPLANT
STOCKINETTE M/LG 89821 (MISCELLANEOUS) ×3 IMPLANT
SUT ETHIBOND 0 36 GRN (SUTURE) IMPLANT
SUT MNCRL 4-0 (SUTURE) ×4
SUT MNCRL 4-0 27XMFL (SUTURE) ×2
SUT SILK 2 0 (SUTURE)
SUT SILK 2-0 18XBRD TIE 12 (SUTURE) IMPLANT
SUT SILK 3 0 (SUTURE) ×2
SUT SILK 3-0 18XBRD TIE 12 (SUTURE) ×1 IMPLANT
SUT VIC AB 0 CT1 36 (SUTURE) ×15 IMPLANT
SUT VIC AB 3-0 SH 27 (SUTURE) ×4
SUT VIC AB 3-0 SH 27X BRD (SUTURE) ×2 IMPLANT
SUT VICRYL PLUS ABS 0 54 (SUTURE) IMPLANT
SUTURE MNCRL 4-0 27XMF (SUTURE) ×2 IMPLANT
SYR 20ML LL LF (SYRINGE) ×3 IMPLANT
TAPE UMBIL 1/8X18 RADIOPA (MISCELLANEOUS) ×3 IMPLANT

## 2019-10-10 NOTE — Op Note (Signed)
  [facility]   OPERATIVE NOTE   PROCEDURE:  Right above-the-knee amputation  PRE-OPERATIVE DIAGNOSIS: Right foot gangrene  POST-OPERATIVE DIAGNOSIS: same as above  SURGEON:  Katha Cabal, MD  ASSISTANT(S): Ms Hezzie Bump  ANESTHESIA: general  ESTIMATED BLOOD LOSS: 75 cc  FINDING(S): Viable muscle bellies  SPECIMEN(S):  Right above-the-knee amputation  INDICATIONS:   [name] is a 86 y.o. female who presents with right lower extremity gangrene.  The patient is scheduled for a right above-the-knee amputation.  I discussed in depth with the patient the risks, benefits, and alternatives to this procedure.  The patient is aware that the risk of this operation included but are not limited to:  bleeding, infection, myocardial infarction, stroke, death, failure to heal amputation wound, and possible need for more proximal amputation.  The patient is aware of the risks and agrees proceed forward with the procedure.  DESCRIPTION: After full informed written consent was obtained from the patient, the patient was taken to the operating room, and placed supine upon the operating table.  Prior to induction, the patient received IV antibiotics.  The patient was then prepped and draped in the standard fashion for a right above-the-knee amputation.  After obtaining adequate anesthesia, the patient was prepped and draped in the standard fashion for a above-the-knee amputation.  I marked out the anterior and posterior flaps for a fish-mouth type of amputation.  I made the incisions for these flaps, and then dissected through the subcutaneous tissue, fascia, and muscles circumferentially.  I elevated  the periosteal tissue 4-5 cm more proximal than the anterior skin flap.  I then transected the femur with a power saw at this level.  Then I smoothed out the rough edges of the bone.  At this point, the specimen was passed off the field as the above-the-knee amputation.  At  this point, I clamped all visibly bleeding arteries and veins using a combination of suture ligation with silk suture and electrocautery.   Bleeding continued to be controlled with electrocautery and suture ligature.  The stump was washed off with sterile normal saline and no further active bleeding was noted.  I reapproximated the anterior and posterior fascia  with interrupted stitches of 0 Vicryl.  This was completed along the entire length of anterior and posterior fascia until there were no more loose space in the fascial line. The subcutaneous tissue was then approximated with 2-0 vicryl sutures. The skin was then  reapproximated with staples.  The stump was washed off and dried.  The incision was dressed with Xeroform and ABD pads, and  then fluffs were applied.  Kerlix was wrapped around the leg and then gently an ACE wrap was applied.  A large Ioban was then placed over the ACE wrap to secure the dressing. The patient was then awakened and take to the recovery room in stable condition.   COMPLICATIONS: none  CONDITION: stable  [name]  10/10/2019, 11:21 AM   This note was created with Dragon Medical transcription system. Any errors in dictation are purely unintentional.

## 2019-10-10 NOTE — Anesthesia Preprocedure Evaluation (Signed)
Anesthesia Evaluation  Patient identified by MRN, date of birth, ID band Patient awake    Reviewed: Allergy & Precautions, H&P , NPO status , Patient's Chart, lab work & pertinent test results, reviewed documented beta blocker date and time   History of Anesthesia Complications Negative for: history of anesthetic complications  Airway Mallampati: III  TM Distance: >3 FB Neck ROM: full    Dental  (+) Edentulous Upper, Edentulous Lower, Dental Advidsory Given   Pulmonary neg shortness of breath, COPD, neg recent URI,    Pulmonary exam normal breath sounds clear to auscultation       Cardiovascular Exercise Tolerance: Good hypertension, (-) angina+ Peripheral Vascular Disease and +CHF  (-) Past MI and (-) Cardiac Stents + dysrhythmias Atrial Fibrillation + Valvular Problems/Murmurs  Rhythm:regular Rate:Normal     Neuro/Psych negative neurological ROS  negative psych ROS   GI/Hepatic negative GI ROS, Neg liver ROS,   Endo/Other  diabetesHypothyroidism   Renal/GU CRFRenal disease  negative genitourinary   Musculoskeletal   Abdominal   Peds  Hematology negative hematology ROS (+)   Anesthesia Other Findings Past Medical History: No date: A-fib (HCC) No date: CHF (congestive heart failure) (HCC) No date: Diabetes mellitus without complication (HCC) No date: Hypertension   Reproductive/Obstetrics negative OB ROS                             Anesthesia Physical Anesthesia Plan  ASA: III  Anesthesia Plan: General   Post-op Pain Management:    Induction: Intravenous  PONV Risk Score and Plan: 3 and Ondansetron, Dexamethasone and Treatment may vary due to age or medical condition  Airway Management Planned: LMA  Additional Equipment:   Intra-op Plan:   Post-operative Plan: Extubation in OR  Informed Consent: I have reviewed the patients History and Physical, chart, labs and  discussed the procedure including the risks, benefits and alternatives for the proposed anesthesia with the patient or authorized representative who has indicated his/her understanding and acceptance.     Dental Advisory Given  Plan Discussed with: Anesthesiologist, CRNA and Surgeon  Anesthesia Plan Comments:         Anesthesia Quick Evaluation

## 2019-10-10 NOTE — Anesthesia Procedure Notes (Signed)
Procedure Name: Intubation Performed by: Storm Sovine R, CRNA Pre-anesthesia Checklist: Patient identified, Emergency Drugs available, Suction available and Patient being monitored Patient Re-evaluated:Patient Re-evaluated prior to induction Oxygen Delivery Method: Circle system utilized Preoxygenation: Pre-oxygenation with 100% oxygen Induction Type: IV induction Laryngoscope Size: Mac and 3 Grade View: Grade I Tube type: Oral Tube size: 7.0 mm Number of attempts: 1 Placement Confirmation: ETT inserted through vocal cords under direct vision,  positive ETCO2 and breath sounds checked- equal and bilateral Secured at: 21 cm Tube secured with: Tape Dental Injury: Teeth and Oropharynx as per pre-operative assessment        

## 2019-10-10 NOTE — Progress Notes (Signed)
PT Cancellation Note  Patient Details Name: [name] MRN: [id number] DOB: [birth year]   Cancelled Treatment:    Reason Eval/Treat Not Completed: Other (comment). Pt currently scheduled and off the floor for R AKA this date. Due to change in status, will need new PT orders to resume prior care. Will complete current orders at this time.   AmeLie Hollars 10/10/2019, 9:32 AM Greggory Stallion, PT, DPT [phone]

## 2019-10-10 NOTE — Transfer of Care (Addendum)
Immediate Anesthesia Transfer of Care Note  Patient: [name]  Procedure(s) Performed: Procedure(s): AMPUTATION ABOVE KNEE (Right)  Patient Location: PACU  Anesthesia Type:General  Level of Consciousness: sedated  Airway & Oxygen Therapy: Patient Spontanous Breathing and Patient connected to face mask oxygen  Post-op Assessment: Report given to RN and Post -op Vital signs reviewed and stable  Post vital signs: Reviewed and stable  Last Vitals:  Vitals:   10/10/19 0909 10/10/19 1139  BP: 125/78 (!) 91/52  Pulse: 71 75  Resp: 18 13  Temp:  (P) 36.9 C  SpO2: 96% 22%    Complications: No apparent anesthesia complications

## 2019-10-10 NOTE — Consult Note (Signed)
[facility] for Heparin drip Indication: Lower Extremity Ischemia  Allergies  Allergen Reactions  . Sulfa Antibiotics Hives   Patient Measurements: Height: 5\' 3"  (160 cm) Weight: 57.2 kg (126 lb 1.7 oz) IBW/kg (Calculated) : 52.4 Heparin Dosing Weight: 53.1kg  Vital Signs:    Labs: Recent Labs    10/08/19 0428 10/08/19 0428 10/08/19 0953 10/08/19 0953 10/09/19 0443 10/10/19 0004  HGB 6.6*   < > 9.0*   < > 9.9* 9.1*  HCT 20.1*   < > 28.0*  --  29.6* 28.2*  PLT 281   < > 267  --  333 323  LABPROT  --   --   --   --   --  13.7  INR  --   --   --   --   --  1.1  HEPARINUNFRC 0.44  --   --   --  0.54 0.56  CREATININE  --   --   --   --  1.92* 1.97*   < > = values in this interval not displayed.    Estimated Creatinine Clearance: 15.1 mL/min (A) (by C-G formula based on SCr of 1.97 mg/dL (H)).   Medical History: Past Medical History:  Diagnosis Date  . A-fib ([place])   . CHF (congestive heart failure) ([place])   . Diabetes mellitus without complication ([place])   . Hypertension     Assessment: 86 yo female with limb threatening ischemia. Patient s/p mechanical thrombectomy. Was on Aggrastat, now stopped. Pharmacy has been consulted to restart a heparin drip.  Goal of Therapy:  Heparin level 0.3-0.7 units/ml Monitor platelets by anticoagulation protocol: Yes   Plan:  06/30 @ 0000 HL 0.56 therapeutic. Will continue current rate and will recheck HL w/ am labs will continue to monitor.  Tobie Lords, PharmD, BCPS Clinical Pharmacist 10/10/2019 1:58 AM

## 2019-10-10 NOTE — Progress Notes (Signed)
OT Cancellation Note  Patient Details Name: [name] MRN: [id number] DOB: 08/12/1925   Cancelled Treatment:    Reason Eval/Treat Not Completed: Patient at procedure or test/ unavailable;Other (comment) Pt currently scheduled and off the floor for R AKA this date. Due to change in status, will need new OT orders to resume prior care. Will complete current orders at this time.  Shara Blazing, M.S., OTR/L Ascom: [phone] 10/10/19, 12:13 PM

## 2019-10-10 NOTE — Progress Notes (Signed)
   10/10/19 1900  Clinical Encounter Type  Visited With Patient  Visit Type Follow-up  Referral From Chaplain  Consult/Referral To Chaplain  Chaplain checked in on patient as she told daughter she would. Patient was sleeping, therefore chaplain left.

## 2019-10-10 NOTE — Progress Notes (Addendum)
   10/10/19 1400  Clinical Encounter Type  Visited With Patient and family together  Visit Type Initial  Referral From Nurse  Consult/Referral To Chaplain  Responding to OR, chaplain stopped in to visit with patient but she was sleeping. Chaplain talked with daughter, [name]. [name] was teary eyed saying they weren't sure how their mother would come through surgery because of her mental state. [name] said that patient told them that she is ready. Patient's husband died in 2023/04/08 and her 70th wedding anniversary was a few months ago. Chaplain asked daughter how she is sitting with the way her mother is thinking and feeling and she started crying more. Chaplain asked if she could pray with her and she said yes. Chaplain prayed and told daughter that she would check on patient throughout the night.

## 2019-10-11 LAB — CBC
HCT: 27.1 % — ABNORMAL LOW (ref 36.0–46.0)
Hemoglobin: 8.6 g/dL — ABNORMAL LOW (ref 12.0–15.0)
MCH: 29.9 pg (ref 26.0–34.0)
MCHC: 31.7 g/dL (ref 30.0–36.0)
MCV: 94.1 fL (ref 80.0–100.0)
Platelets: 335 10*3/uL (ref 150–400)
RBC: 2.88 MIL/uL — ABNORMAL LOW (ref 3.87–5.11)
RDW: 13.5 % (ref 11.5–15.5)
WBC: 14.5 10*3/uL — ABNORMAL HIGH (ref 4.0–10.5)
nRBC: 0 % (ref 0.0–0.2)

## 2019-10-11 LAB — BASIC METABOLIC PANEL
Anion gap: 11 (ref 5–15)
BUN: 45 mg/dL — ABNORMAL HIGH (ref 8–23)
CO2: 24 mmol/L (ref 22–32)
Calcium: 8.7 mg/dL — ABNORMAL LOW (ref 8.9–10.3)
Chloride: 102 mmol/L (ref 98–111)
Creatinine, Ser: 1.91 mg/dL — ABNORMAL HIGH (ref 0.44–1.00)
GFR calc Af Amer: 26 mL/min — ABNORMAL LOW (ref >60)
GFR calc non Af Amer: 22 mL/min — ABNORMAL LOW (ref >60)
Glucose, Bld: 260 mg/dL — ABNORMAL HIGH (ref 70–99)
Potassium: 4.4 mmol/L (ref 3.5–5.1)
Sodium: 137 mmol/L (ref 135–145)

## 2019-10-11 LAB — GLUCOSE, CAPILLARY: Glucose-Capillary: 238 mg/dL — ABNORMAL HIGH (ref 70–99)

## 2019-10-11 MED ORDER — INSULIN ASPART 100 UNIT/ML ~~LOC~~ SOLN
0.0000 [IU] | Freq: Three times a day (TID) | SUBCUTANEOUS | Status: DC
  Administered 2019-10-12: 3 [IU] via SUBCUTANEOUS
  Administered 2019-10-12: 2 [IU] via SUBCUTANEOUS
  Administered 2019-10-12: 3 [IU] via SUBCUTANEOUS
  Administered 2019-10-13: 5 [IU] via SUBCUTANEOUS
  Administered 2019-10-13: 2 [IU] via SUBCUTANEOUS
  Administered 2019-10-13: 1 [IU] via SUBCUTANEOUS
  Administered 2019-10-14: 3 [IU] via SUBCUTANEOUS
  Administered 2019-10-14: 2 [IU] via SUBCUTANEOUS
  Administered 2019-10-15: 3 [IU] via SUBCUTANEOUS
  Administered 2019-10-15 (×2): 1 [IU] via SUBCUTANEOUS
  Administered 2019-10-16: 2 [IU] via SUBCUTANEOUS
  Administered 2019-10-16: 3 [IU] via SUBCUTANEOUS
  Administered 2019-10-17: 1 [IU] via SUBCUTANEOUS
  Administered 2019-10-17: 3 [IU] via SUBCUTANEOUS
  Administered 2019-10-18 (×2): 1 [IU] via SUBCUTANEOUS
  Administered 2019-10-19 – 2019-10-22 (×5): 2 [IU] via SUBCUTANEOUS
  Administered 2019-10-23 – 2019-10-24 (×3): 1 [IU] via SUBCUTANEOUS
  Filled 2019-10-11 (×25): qty 1

## 2019-10-11 MED ORDER — ASCORBIC ACID 500 MG PO TABS
250.0000 mg | ORAL_TABLET | Freq: Two times a day (BID) | ORAL | Status: DC
  Administered 2019-10-11 – 2019-10-25 (×28): 250 mg via ORAL
  Filled 2019-10-11 (×28): qty 1

## 2019-10-11 MED ORDER — ADULT MULTIVITAMIN W/MINERALS CH
1.0000 | ORAL_TABLET | Freq: Every day | ORAL | Status: DC
  Administered 2019-10-12 – 2019-10-25 (×14): 1 via ORAL
  Filled 2019-10-11 (×14): qty 1

## 2019-10-11 MED ORDER — INSULIN ASPART 100 UNIT/ML ~~LOC~~ SOLN
0.0000 [IU] | Freq: Every day | SUBCUTANEOUS | Status: DC
  Administered 2019-10-11 – 2019-10-14 (×2): 2 [IU] via SUBCUTANEOUS
  Filled 2019-10-11 (×2): qty 1

## 2019-10-11 MED ORDER — ENSURE ENLIVE PO LIQD
237.0000 mL | Freq: Two times a day (BID) | ORAL | Status: DC
  Administered 2019-10-11 – 2019-10-22 (×14): 237 mL via ORAL

## 2019-10-11 NOTE — Progress Notes (Signed)
PT Cancellation Note  Patient Details Name: [name] MRN: [id number] DOB: 1925/12/25   Cancelled Treatment:    Reason Eval/Treat Not Completed: Fatigue/lethargy limiting ability to participate (Consult received and chart reviewed. Patient politely declines session due to lethargy and generally "not feeling good".  Will continue efforts next date as medically appropriate and available-both patient and daughte report optimal alertness in AM.)  Of note, did reposition patient in bed (max/dep assist) and initiate brief education about desensitization techniques to R residual limb.  Patient voices understanding of education (hand-over-hand required from therapist), but with difficulty maintaining alertness during education.  Will reinforce in subsequent session as appropriate.   Orlean Holtrop H. Owens Shark, PT, DPT, NCS 10/11/19, 2:14 PM [phone]

## 2019-10-11 NOTE — Progress Notes (Signed)
[facility] Daily Progress Note   Subjective: 10/10/19:  Right above-the-knee amputation  10/04/19: 1. Introduction catheter intorightlower extremity 3rd order catheter placement with additional third order 2.Contrast injectionrightlower extremity for distal runoff  3. Percutaneous transluminal angioplastyto 4 mm with Lutonix drug-eluting balloon mid to distal popliteal to 4 mm4. Percutaneous transluminal angioplastyto 3 mm right anterior tibial.  5. Percutaneous transluminal angioplasty to 2.5 mm right peroneal artery. 6.Mechanical thrombectomy of the right SFA and popliteal using the penumbra CAT 6 device in association with 10 mg of TPA. 7.Star close closureleftcommon femoral arteriotomy  Patient seems to be tired this AM.  Daughter at bedside.  Objective: Vitals:   10/10/19 2358 10/11/19 0500 10/11/19 0517 10/11/19 0939  BP: (!) 143/87  (!) 141/67 134/75  Pulse: (!) 59  69   Resp: 20  20   Temp: (!) 97.4 F (36.3 C)  (!) 97.5 F (36.4 C) 98.1 F (36.7 C)  TempSrc: Oral  Oral Oral  SpO2: 98%  100% 98%  Weight:  54.2 kg    Height:        Intake/Output Summary (Last 24 hours) at 10/11/2019 1059 Last data filed at 10/11/2019 0762 Gross per 24 hour  Intake 4509.54 ml  Output 750 ml  Net 3759.54 ml   Physical Exam: A&Ox3, NAD CV: RRR Pulmonary: CTA Bilaterally Abdomen: Soft, Nontender, Nondistended Vascular: Right lower extremity:OR dressing clean dry intact.  Thigh is soft.   Laboratory: CBC    Component Value Date/Time   WBC 14.5 (H) 10/11/2019 0939   HGB 8.6 (L) 10/11/2019 0939   HCT 27.1 (L) 10/11/2019 0939   PLT 335 10/11/2019 0939   BMET    Component Value Date/Time   NA 137 10/11/2019 0939   NA 141 08/03/2011 0729   K 4.4 10/11/2019 0939   K 3.6 08/03/2011 0729   CL 102 10/11/2019 0939   CL 105 08/03/2011 0729    CO2 24 10/11/2019 0939   CO2 26 08/03/2011 0729   GLUCOSE 260 (H) 10/11/2019 0939   GLUCOSE 149 (H) 08/03/2011 0729   BUN 45 (H) 10/11/2019 0939   BUN 16 08/03/2011 0729   CREATININE 1.91 (H) 10/11/2019 0939   CREATININE 1.07 08/03/2011 0729   CALCIUM 8.7 (L) 10/11/2019 0939   CALCIUM 9.5 08/03/2011 0729   GFRNONAA 22 (L) 10/11/2019 0939   GFRNONAA 48 (L) 08/03/2011 0729   GFRAA 26 (L) 10/11/2019 0939   GFRAA 56 (L) 08/03/2011 0729   Assessment/Planning: The patient is a 86 year old female with chronic ischemia to the right lower extremity.  Status post right above-the-knee amputation - POD#1  1) KVO maintenance fluids at midnight.  Patient with chronic kidney disease.  Watch renal function. 2) Hemoglobin relatively stable after surgery.  Continue to monitor CBC. 3) Will order PT/OT.  Would expect patient to be discharged to SNF. 4) Will plan on OR dressing removal either Saturday or Sunday.  Discussed with Dr. Eber Hong Kathleen Gladue PA-C 10/11/2019 10:59 AM

## 2019-10-11 NOTE — Care Management Important Message (Signed)
Important Message  Patient Details  Name: [name] MRN: [id number] Date of Birth: 25-Feb-1926   Medicare Important Message Given:  Yes     [name] 10/11/2019, 1:56 PM

## 2019-10-11 NOTE — Progress Notes (Signed)
Initial Nutrition Assessment  DOCUMENTATION CODES:   Severe malnutrition in context of social or environmental circumstances  INTERVENTION:   Ensure Enlive po BID, each supplement provides 350 kcal and 20 grams of protein  MVI daily   Vitamin C '250mg'$  po BID  Dysphagia 3 diet   NUTRITION DIAGNOSIS:   Severe Malnutrition related to social / environmental circumstances (chronic poor appetite, advanced age) as evidenced by moderate to severe fat depletions, moderate to severe muscle depletions.  GOAL:   Patient will meet greater than or equal to 90% of their needs  MONITOR:   PO intake, Supplement acceptance, Labs, Weight trends, Skin, I & O's  REASON FOR ASSESSMENT:   Malnutrition Screening Tool    ASSESSMENT:   86 year old female with h/o DM, HTN and chronic ischemia to the right lower extremity who is admitted with gangrene and ischemia now s/p R AKA 6/30   Met with patient and pt's daughter in room today. Pt reports that she is feeling tired today. Pt reports poor appetite and oral intake at baseline. Daughter reports that patient eats best at breakfast and then eats light meals for the rest of that day; a typical supper may include a sandwich and some peaches. Pt has drank supplements in the past and reports that she enjoyed the chocolate and strawberry flavors; pt does not drink them regularly. Pt also reports difficulty swallowing sometimes with food getting stuck in her throat. Pt assessed by SLP who recommend dysphagia 3 diet; pt would like to have her meats chopped and her vegetables cooked soft. RD will add supplements and vitamins to help pt meet her estimated needs and support wound healing. Pt also with widespread ecchymosis. Pt ate 95% of her breakfast today which included pancakes.   Per chart, pt appears fairly weight stable at baseline.   Medications reviewed and include: lasix, synthroid, KCl, NaCl '@75ml'$ /hr   Labs reviewed: BUN 45(H), creat 1.91(H) Wbc-  14.5(H), Hgb 8.6(L), Hct 27.1(L) cbgs- 158, 135, 126 x 24 hrs AIC 6.2(H)- 02/2019  NUTRITION - FOCUSED PHYSICAL EXAM:    Most Recent Value  Orbital Region Moderate depletion  Upper Arm Region Severe depletion  Thoracic and Lumbar Region Moderate depletion  Buccal Region Moderate depletion  Temple Region Severe depletion  Clavicle Bone Region Severe depletion  Clavicle and Acromion Bone Region Severe depletion  Scapular Bone Region Severe depletion  Dorsal Hand Severe depletion  Patellar Region Severe depletion  Anterior Thigh Region Severe depletion  Posterior Calf Region Severe depletion  Edema (RD Assessment) None  Hair Reviewed  Eyes Reviewed  Mouth Reviewed  Skin Reviewed  Nails Reviewed     Diet Order:   Diet Order            Diet regular Room service appropriate? Yes; Fluid consistency: Thin  Diet effective now                EDUCATION NEEDS:   Education needs have been addressed  Skin:  Skin Assessment: Reviewed RN Assessment (Incision R leg)  Last BM:  6/30- TYPE 4  Height:   Ht Readings from Last 1 Encounters:  10/10/19 '5\' 3"'$  (1.6 m)    Weight:   Wt Readings from Last 1 Encounters:  10/11/19 54.2 kg    Ideal Body Weight:  52.3 kg  BMI:  Body mass index is 21.17 kg/m.  Estimated Nutritional Needs:   Kcal:  1300-1500kcal/day  Protein:  70-80g/day  Fluid:  >1.3L/day  Koleen Distance MS, RD, LDN Please refer  to [facility] for RD and/or RD on-call/weekend/after hours pager

## 2019-10-11 NOTE — Progress Notes (Signed)
Pt has intermittent sinus tachycardic. Runs from 108- 129. PA aware,  no new order.

## 2019-10-11 NOTE — NC FL2 (Signed)
[place] LEVEL OF CARE SCREENING TOOL     IDENTIFICATION  Patient Name: [name] Birthdate: 03-28-1926 Sex: female Admission Date (Current Location): 10/03/2019  [place] and [place] Number:  Engineering geologist and Address:  [facility], [address], [place], [place] [address]      Provider Number: [id number]  Attending Physician Name and Address:  Katha Cabal, MD  Relative Name and Phone Number:  Biermann III,John Pandora Leiter) [phone]    Current Level of Care: Hospital Recommended Level of Care: [place] Prior Approval Number:    Date Approved/Denied:   PASRR Number: [id number]  Discharge Plan: SNF    Current Diagnoses: Patient Active Problem List   Diagnosis Date Noted  . Ischemia of right lower extremity 10/03/2019  . Ischemia of extremity 07/31/2019  . Proteinuria 04/23/2019  . Atherosclerosis of native arteries of the extremities with ulceration ([place]) 03/01/2019  . Chronic vertebral fracture due to osteoporosis ([place]) 05/20/2018  . Closed T10 fracture ([place]) 05/20/2018  . Gout 03/27/2018  . Hypothyroidism 03/27/2018  . Subdural hematoma ([place]) 03/08/2018  . Abnormal SPEP 12/07/2017  . History of vitamin D deficiency 11/30/2017  . Hyperlipidemia associated with type 2 diabetes mellitus ([place]) 11/30/2017  . PAD (peripheral artery disease) ([place]) 07/19/2017  . Leg pain, lateral, left 07/19/2017  . Diabetes ([place]) 07/19/2017  . Hyperlipidemia 07/19/2017  . Essential hypertension 07/19/2017  . Mild aortic valve stenosis 06/22/2017  . Persistent atrial fibrillation ([place]) 06/22/2017  . Moderate mitral insufficiency 10/30/2015  . Chronic kidney disease, stage 3 unspecified 09/19/2014  . Type II or unspecified type diabetes mellitus with renal manifestations, not stated as uncontrolled(250.40) 11/08/2013  . Vitamin D deficiency 11/08/2013    Orientation RESPIRATION BLADDER Height & Weight     Self,  Time, Situation, Place  O2 (Nasal Canula 1 L) Continent, External catheter Weight: 119 lb 7.8 oz (54.2 kg) Height:  5\' 3"  (160 cm)  BEHAVIORAL SYMPTOMS/MOOD NEUROLOGICAL BOWEL NUTRITION STATUS   (None)  (None) Continent Diet (DYS 3)  AMBULATORY STATUS COMMUNICATION OF NEEDS Skin   Extensive Assist Verbally Bruising, Surgical wounds (Left AKA: Dermabond, Xeroform, Fluffs, Ace.)                       Personal Care Assistance Level of Assistance  Bathing, Feeding, Dressing Bathing Assistance: Limited assistance Feeding assistance: Independent Dressing Assistance: Limited assistance Total Care Assistance: Maximum assistance   Functional Limitations Info  Sight, Hearing, Speech Sight Info: Adequate Hearing Info: Adequate Speech Info: Adequate    SPECIAL CARE FACTORS FREQUENCY  PT (By licensed PT), OT (By licensed OT), Speech therapy     PT Frequency: 5 x week OT Frequency: 5 x week     Speech Therapy Frequency: 5 x week      Contractures Contractures Info: Not present    Additional Factors Info  Code Status, Allergies Code Status Info: Full code Allergies Info: Sulfa Antibiotics           Current Medications (10/11/2019):  This is the current hospital active medication list Current Facility-Administered Medications  Medication Dose Route Frequency Provider Last Rate Last Admin  . 0.9 %  sodium chloride infusion   Intravenous Continuous Stegmayer, Kimberly A, PA-C 75 mL/hr at 10/11/19 0841 Rate Verify at 10/11/19 0841  . acetaminophen (TYLENOL) tablet 650 mg  650 mg Oral Q6H PRN Schnier, Dolores Lory, MD       Or  . acetaminophen (TYLENOL) suppository  650 mg  650 mg Rectal Q6H PRN Schnier, Dolores Lory, MD      . ascorbic acid (VITAMIN C) tablet 250 mg  250 mg Oral BID Stegmayer, Kimberly A, PA-C      . carvedilol (COREG) tablet 3.125 mg  3.125 mg Oral BID WC Schnier, Dolores Lory, MD   3.125 mg at 10/11/19 0837  . ceFAZolin (ANCEF) IVPB 1 g/50 mL premix  1 g Intravenous Q12H  Schnier, Dolores Lory, MD      . feeding supplement (ENSURE ENLIVE) (ENSURE ENLIVE) liquid 237 mL  237 mL Oral BID BM Stegmayer, Kimberly A, PA-C      . furosemide (LASIX) tablet 20 mg  20 mg Oral BID Schnier, Dolores Lory, MD   20 mg at 10/11/19 0837  . gabapentin (NEURONTIN) capsule 100-200 mg  100-200 mg Oral QHS PRN Schnier, Dolores Lory, MD      . insulin aspart (novoLOG) injection 0-5 Units  0-5 Units Subcutaneous QHS Dallie Piles, RPH      . insulin aspart (novoLOG) injection 0-9 Units  0-9 Units Subcutaneous TID WC Dallie Piles, RPH      . levothyroxine (SYNTHROID) tablet 88 mcg  88 mcg Oral QAC breakfast Katha Cabal, MD   88 mcg at 10/11/19 0541  . lisinopril (ZESTRIL) tablet 5 mg  5 mg Oral Daily Schnier, Dolores Lory, MD   5 mg at 10/11/19 0837  . morphine 2 MG/ML injection 2 mg  2 mg Intravenous Q3H PRN Schnier, Dolores Lory, MD      . Derrill Memo ON 10/12/2019] multivitamin with minerals tablet 1 tablet  1 tablet Oral Daily Stegmayer, Kimberly A, PA-C      . ondansetron (ZOFRAN) injection 4 mg  4 mg Intravenous Q6H PRN Schnier, Dolores Lory, MD      . oxyCODONE (Oxy IR/ROXICODONE) immediate release tablet 5-10 mg  5-10 mg Oral Q4H PRN Schnier, Dolores Lory, MD   10 mg at 10/11/19 1010  . potassium chloride SA (KLOR-CON) CR tablet 20 mEq  20 mEq Oral Daily Schnier, Dolores Lory, MD   20 mEq at 10/11/19 [phone]  . sodium chloride flush (NS) 0.9 % injection 3 mL  3 mL Intravenous Q12H Schnier, Dolores Lory, MD   3 mL at 10/11/19 1015  . sodium chloride flush (NS) 0.9 % injection 3 mL  3 mL Intravenous PRN Schnier, Dolores Lory, MD         Discharge Medications: Please see discharge summary for a list of discharge medications.  Relevant Imaging Results:  Relevant Lab Results:   Additional Information SS#: [id number]  Candie Chroman, LCSW

## 2019-10-11 NOTE — TOC Progression Note (Signed)
Transition of Care [facility]) - Progression Note    Patient Details  Name: [name] MRN: [id number] Date of Birth: Jul 16, 1925  Transition of Care [facility]) CM/SW [place], LCSW Phone Number: 10/11/2019, 1:34 PM  Clinical Narrative:  Per vascular PA, likely discharge to SNF over the weekend. [place] admissions coordinator is aware and agreeable. CSW faxed clinicals to [facility] for insurance authorization review. Will need updated therapy notes. PT and OT were reconsulted today. Patient will need a new COVID test within 48 hours of discharge. Will ask PA to order tomorrow.  Expected Discharge Plan: [place] Barriers to Discharge: Continued Medical Work up  Expected Discharge Plan and Services Expected Discharge Plan: [place]   Discharge Planning Services: CM Consult   Living arrangements for the past 2 months: Single Family Home                                       Social Determinants of Health (SDOH) Interventions    Readmission Risk Interventions No flowsheet data found.

## 2019-10-12 DIAGNOSIS — E43 Unspecified severe protein-calorie malnutrition: Secondary | ICD-10-CM | POA: Insufficient documentation

## 2019-10-12 LAB — CBC
HCT: 26.8 % — ABNORMAL LOW (ref 36.0–46.0)
Hemoglobin: 9 g/dL — ABNORMAL LOW (ref 12.0–15.0)
MCH: 30.4 pg (ref 26.0–34.0)
MCHC: 33.6 g/dL (ref 30.0–36.0)
MCV: 90.5 fL (ref 80.0–100.0)
Platelets: 342 10*3/uL (ref 150–400)
RBC: 2.96 MIL/uL — ABNORMAL LOW (ref 3.87–5.11)
RDW: 13.6 % (ref 11.5–15.5)
WBC: 13.7 10*3/uL — ABNORMAL HIGH (ref 4.0–10.5)
nRBC: 0 % (ref 0.0–0.2)

## 2019-10-12 LAB — BASIC METABOLIC PANEL
Anion gap: 13 (ref 5–15)
BUN: 47 mg/dL — ABNORMAL HIGH (ref 8–23)
CO2: 26 mmol/L (ref 22–32)
Calcium: 8.7 mg/dL — ABNORMAL LOW (ref 8.9–10.3)
Chloride: 99 mmol/L (ref 98–111)
Creatinine, Ser: 1.8 mg/dL — ABNORMAL HIGH (ref 0.44–1.00)
GFR calc Af Amer: 28 mL/min — ABNORMAL LOW (ref >60)
GFR calc non Af Amer: 24 mL/min — ABNORMAL LOW (ref >60)
Glucose, Bld: 189 mg/dL — ABNORMAL HIGH (ref 70–99)
Potassium: 4.4 mmol/L (ref 3.5–5.1)
Sodium: 138 mmol/L (ref 135–145)

## 2019-10-12 LAB — GLUCOSE, CAPILLARY
Glucose-Capillary: 159 mg/dL — ABNORMAL HIGH (ref 70–99)
Glucose-Capillary: 216 mg/dL — ABNORMAL HIGH (ref 70–99)
Glucose-Capillary: 216 mg/dL — ABNORMAL HIGH (ref 70–99)
Glucose-Capillary: 174 mg/dL — ABNORMAL HIGH (ref 70–99)

## 2019-10-12 LAB — MAGNESIUM: Magnesium: 1.9 mg/dL (ref 1.7–2.4)

## 2019-10-12 LAB — SURGICAL PATHOLOGY

## 2019-10-12 MED ORDER — DOCUSATE SODIUM 100 MG PO CAPS
100.0000 mg | ORAL_CAPSULE | Freq: Two times a day (BID) | ORAL | Status: DC
  Administered 2019-10-12 – 2019-10-18 (×14): 100 mg via ORAL
  Filled 2019-10-12 (×14): qty 1

## 2019-10-12 MED ORDER — SENNOSIDES-DOCUSATE SODIUM 8.6-50 MG PO TABS
1.0000 | ORAL_TABLET | Freq: Every evening | ORAL | Status: DC | PRN
  Administered 2019-10-12 – 2019-10-23 (×3): 1 via ORAL
  Filled 2019-10-12 (×3): qty 1

## 2019-10-12 MED ORDER — POLYETHYLENE GLYCOL 3350 17 G PO PACK
17.0000 g | PACK | Freq: Every day | ORAL | Status: DC | PRN
  Administered 2019-10-12 – 2019-10-17 (×3): 17 g via ORAL
  Filled 2019-10-12 (×3): qty 1

## 2019-10-12 MED ORDER — SODIUM CHLORIDE 0.9 % IV SOLN
INTRAVENOUS | Status: DC | PRN
  Administered 2019-10-12: 1000 mL via INTRAVENOUS

## 2019-10-12 NOTE — Plan of Care (Signed)
Pt is constipated. Pt has been given PRN laxatives with no result.

## 2019-10-12 NOTE — Evaluation (Signed)
Physical Therapy Re-Evaluation Patient Details Name: [name] MRN: [id number] DOB: 1925/09/21 Today's Date: 10/12/2019   History of Present Illness  [name] is a 86 y/o female who is s/p R AKA on 10/10/19 during same admission from having been admitted for limb threatening ischemia s/p RLE distal popliteal and R anterior tibial angioplasty, R SFA and popliteal thrombectomy, and femoral arteriotomy on 10/03/19. PMHx includes Afib, CHF, DM, HTN, HLD.   Clinical Impression  Re-evaluation after R AKA on 10/10/2019. Pt lying with HOB elevated upon arrival and pt requesting to be placed on BSC. Pt limited to bed level activities during today's session including rolling to L with heavy max A. Pt with poor energy level and activity tolerance this morning with pain and fatigue as limiting factors. Pt performed BUE therex and isometric hip extension and adduction on RLE and active heel slides on LLE. Max verbal and tactile cues for continued participation during exercises. Pt required increased time to complete all activities. Pt continues to present with decreased strength and functional activity tolerance. Recommend continued skilled PT to address current deficits and SNF at discharge from acute stay to maximize functional mobility. Pt remained on 1L O2 via nasal cannula throughout session. Pt's RR increased to 34 breaths/minute during bed mobility however all other vitals remained stable. Will continue to progress pt as tolerated.    Follow Up Recommendations SNF    Equipment Recommendations  None recommended by PT    Recommendations for Other Services       Precautions / Restrictions Precautions Precautions: Fall Restrictions Weight Bearing Restrictions: Yes RLE Weight Bearing: Non weight bearing Other Position/Activity Restrictions: Per vascular surgeon      Mobility  Bed Mobility Overal bed mobility: Needs Assistance Bed Mobility: Rolling Rolling: Max assist   Supine to sit:  (not  performed)     General bed mobility comments: Heavy max A for rolling to onto L side for bedpan placement; pt able to reach for a grasp bedrail however unable to initiate or complete roll without heavy physical assistance  Transfers                 General transfer comment: not assessed d/t decreased safety: pt with increased pain and fatigue  Ambulation/Gait                Stairs            Wheelchair Mobility    Modified Rankin (Stroke Patients Only)       Balance                                             Pertinent Vitals/Pain Pain Assessment: Faces Pain Score: 8  Faces Pain Scale: Hurts whole lot Pain Location: RLE and general malaise  Pain Descriptors / Indicators: Aching;Discomfort Pain Intervention(s): Monitored during session;Limited activity within patient's tolerance;Repositioned    Home Living Family/patient expects to be discharged to:: Private residence Living Arrangements: Children Available Help at Discharge: Family;Available 24 hours/day Type of Home: House Home Access: Ramped entrance     Home Layout: One level Home Equipment: Walker - 2 wheels;Walker - 4 wheels;Cane - single point;Grab bars - tub/shower;Grab bars - toilet;Bedside commode;Shower seat;Wheelchair - manual;Toilet riser;Wheelchair - power      Prior Function Level of Independence: Needs assistance   Gait / Transfers Assistance Needed: ambulates with RW at baseline  ADL's / Homemaking Assistance Needed: bird bath, able to dress mod I. Family performs cooking/cleaning/shopping etc. Pt indep with med mgt  Comments: no falls in the last 12 months     Hand Dominance   Dominant Hand: Right    Extremity/Trunk Assessment   Upper Extremity Assessment Upper Extremity Assessment: Generalized weakness (3+/5 to 4-/5 overall)    Lower Extremity Assessment Lower Extremity Assessment: RLE deficits/detail;Generalized weakness RLE Deficits / Details:  pt with guarded positioning of RLE in hip flexion and required increased verbal cues to relax to allow hip extension RLE: Unable to fully assess due to pain RLE Sensation:  (difficult to assess d/t protective wrapping) LLE Deficits / Details: decreased strength overall at bed level; difficulty with participation d/t pain and fatigue    Cervical / Trunk Assessment Cervical / Trunk Assessment: Kyphotic  Communication   Communication: HOH  Cognition Arousal/Alertness: Awake/alert Behavior During Therapy: WFL for tasks assessed/performed Overall Cognitive Status: Within Functional Limits for tasks assessed                                 General Comments:        General Comments General comments (skin integrity, edema, etc.): HR 91 t/o bed level tasks, SpO2 stable on 1L [place]     Exercises Other Exercises Other Exercises: Pt educated re: d/c recs, falls prevention, desensitization techniques, importance of mobility for functional strengthening, IS HEP (frequency & use). Other Exercises: LBD, self-feeding/drinking, bed mobility, IS  Other Exercises: pt performed UE horizontal adduction x 10 bilat for progression towards active participation in bed mobility; pt attempted glute sets x 5 and RLE isometric hip extension and hip adduction x 5; LLE AROM heel slides x 5 Other Exercises: Pt placed onto bedpan and HOB elevated to simulate sitting upright on commode; pt able to void urine however unable to have BM therefore removed from bedpan and placed in more flat position to promote R hip extension to avoid hip flexion contracture   Assessment/Plan    PT Assessment Patient needs continued PT services  PT Problem List Decreased strength;Decreased activity tolerance;Decreased balance;Decreased mobility;Decreased knowledge of use of DME;Pain       PT Treatment Interventions DME instruction;Gait training;Functional mobility training;Therapeutic activities;Therapeutic exercise;Balance  training;Neuromuscular re-education;Patient/family education;Wheelchair mobility training    PT Goals (Current goals can be found in the Care Plan section)  Acute Rehab PT Goals Patient Stated Goal: to get better and go home from hospital PT Goal Formulation: With patient Time For Goal Achievement: 10/26/19 Potential to Achieve Goals: Fair    Frequency 7X/week   Barriers to discharge Decreased caregiver support      Co-evaluation               AM-PAC PT "6 Clicks" Mobility  Outcome Measure Help needed turning from your back to your side while in a flat bed without using bedrails?: A Lot Help needed moving from lying on your back to sitting on the side of a flat bed without using bedrails?: A Lot Help needed moving to and from a bed to a chair (including a wheelchair)?: A Lot Help needed standing up from a chair using your arms (e.g., wheelchair or bedside chair)?: A Lot Help needed to walk in hospital room?: A Lot Help needed climbing 3-5 steps with a railing? : Total 6 Click Score: 11    End of Session   Activity Tolerance: Patient limited by fatigue;Patient limited  by pain Patient left: in bed;with call bell/phone within reach;with bed alarm set Nurse Communication: Mobility status PT Visit Diagnosis: Unsteadiness on feet (R26.81);Muscle weakness (generalized) (M62.81);Pain Pain - Right/Left: Right Pain - part of body: Leg    Time: 2341-4436 PT Time Calculation (min) (ACUTE ONLY): 31 min   Charges:              Vale Haven, SPT  Vale Haven 10/12/2019, 12:46 PM

## 2019-10-12 NOTE — Evaluation (Signed)
Occupational Therapy Evaluation Patient Details Name: [name] MRN: [id number] DOB: 16-Nov-1925 Today's Date: 10/12/2019    History of Present Illness 86yo female pt admitted for limb threatening ischemia. S/p RLE distal popliteal and R anterior tibial angioplasty, R SFA and popliteal thrombectomy, and femoral arteriotomy on 10/03/19. PMHx includes Afib, CHF, DM, HTN, HLD. Now s/p R AKA on 10/10/19.   Clinical Impression   [name] was seen for OT re-evaluation this date s/p R AKA on 10/10/19. Pt presents to acute OT demonstrating impaired ADL performance and functional mobility 2/2 decreased LB access, functional strength/ROM/balance deficits, decreased activity tolerance, and pain. Pt currently requires TOTAL A for LBD at bed level. SETUP face washing and self-feeding/drinking at bed level - pt reports "I just can't eat anything." Anticipate MAX A toileting at bed level including pericare. Pt is highly motivated to return to PLOF, asking "what can I do - I want to move like I used to but I just can't." Pt instructed in IS frequency and use, falls prevention, d/c recommendations, ECS, and de-sensitization techniques. Pt would benefit from skilled OT to address noted impairments and functional limitations (see below for any additional details) in order to maximize safety and independence while minimizing falls risk and caregiver burden. Upon hospital discharge, recommend STR to maximize pt safety and return to PLOF.     Follow Up Recommendations  SNF    Equipment Recommendations   (TBD at next venue of care)    Recommendations for Other Services       Precautions / Restrictions Precautions Precautions: Fall Restrictions Weight Bearing Restrictions: Yes RLE Weight Bearing: Non weight bearing      Mobility Bed Mobility Overal bed mobility: Needs Assistance             General bed mobility comments: MIN A + L rail to adjust torso position in bed. Pt deferred mobility.    Transfers                 General transfer comment: Deferred 2/2 pt too fatigued for sitting attempt    Balance                                           ADL either performed or assessed with clinical judgement   ADL Overall ADL's : Needs assistance/impaired                                       General ADL Comments: TOTAL A for LBD at bed level. SETUP face washing and self-feeding/drinking at bed level - pt reports "I just can't eat anything" this AM. Anticipate MAX A toileting at bed level including pericare.     Vision Baseline Vision/History: Wears glasses Wears Glasses: At all times       Perception     Praxis      Pertinent Vitals/Pain Pain Assessment: 0-10 Pain Score: 8  Pain Location: RLE and general malaise  Pain Descriptors / Indicators: Aching;Discomfort;Dull Pain Intervention(s): Limited activity within patient's tolerance;Repositioned     Hand Dominance Right   Extremity/Trunk Assessment Upper Extremity Assessment Upper Extremity Assessment: Generalized weakness (BUE shoulder flexion AROM limited ~90*. B grip 4/5)   Lower Extremity Assessment Lower Extremity Assessment: RLE deficits/detail;LLE deficits/detail RLE Deficits / Details: Unable to lift R stump off  of bed c BUE assist.  LLE Deficits / Details: Unable to perform SLR at bed level       Communication Communication Communication: HOH   Cognition Arousal/Alertness: Awake/alert Behavior During Therapy: WFL for tasks assessed/performed Overall Cognitive Status: Within Functional Limits for tasks assessed                                     General Comments  HR 91 t/o bed level tasks, SpO2 stable on 1L [place]     Exercises Exercises: Other exercises Other Exercises Other Exercises: Pt educated re: d/c recs, falls prevention, desensitization techniques, importance of mobility for functional strengthening, ECS, IS HEP (frequency &  use). Other Exercises: LBD, self-feeding/drinking, bed mobility, IS    Shoulder Instructions      Home Living Family/patient expects to be discharged to:: Private residence Living Arrangements: Children Available Help at Discharge: Family;Available 24 hours/day (3 daughters take turns staying overnight, generally someone with her during most of the day, maybe home alone for a couple hours) Type of Home: House Home Access: Ramped entrance     Home Layout: One level     Bathroom Shower/Tub: Walk-in Hydrologist: Standard Bathroom Accessibility: Yes How Accessible: Accessible via wheelchair;Accessible via walker Home Equipment: [place] - 2 wheels;Walker - 4 wheels;Cane - single point;Grab bars - tub/shower;Grab bars - toilet;Bedside commode;Shower seat;Wheelchair - manual;Toilet riser;Wheelchair - power          Prior Functioning/Environment Level of Independence: Needs assistance  Gait / Transfers Assistance Needed: ambulates with RW at baseline ADL's / Homemaking Assistance Needed: bird bath, able to dress mod I. Family performs cooking/cleaning/shopping etc. Pt indep with med mgt   Comments: no falls in the last 12 months        OT Problem List: Decreased strength;Pain;Impaired sensation;Decreased activity tolerance;Decreased knowledge of use of DME or AE;Impaired balance (sitting and/or standing);Decreased range of motion      OT Treatment/Interventions: Self-care/ADL training;Therapeutic exercise;Therapeutic activities;DME and/or AE instruction;Patient/family education;Balance training    OT Goals(Current goals can be found in the care plan section) Acute Rehab OT Goals Patient Stated Goal: to get better and go home from hospital OT Goal Formulation: With patient Time For Goal Achievement: 10/26/19 Potential to Achieve Goals: Fair ADL Goals Pt Will Perform Eating: with modified independence;bed level Pt Will Transfer to Toilet: with min guard  assist (rolling at bed level) Additional ADL Goal #1: Pt will tolerate >5 mins sitting EOB c CGA in preparation for ADL tasks.  OT Frequency: Min 2X/week   Barriers to D/C: Inaccessible home environment          Co-evaluation              AM-PAC OT "6 Clicks" Daily Activity     Outcome Measure Help from another person eating meals?: None Help from another person taking care of personal grooming?: None Help from another person toileting, which includes using toliet, bedpan, or urinal?: A Lot Help from another person bathing (including washing, rinsing, drying)?: A Lot Help from another person to put on and taking off regular upper body clothing?: A Little Help from another person to put on and taking off regular lower body clothing?: A Lot 6 Click Score: 17   End of Session    Activity Tolerance: Patient limited by fatigue Patient left: in bed;with call bell/phone within reach;with bed alarm set  OT Visit Diagnosis: Other  abnormalities of gait and mobility (R26.89);Muscle weakness (generalized) (M62.81);Pain Pain - Right/Left: Right Pain - part of body: Ankle and joints of foot                Time: 0921-0939 OT Time Calculation (min): 18 min Charges:  OT General Charges $OT Visit: 1 Visit OT Evaluation $OT Eval Low Complexity: 1 Low OT Treatments $Self Care/Home Management : 8-22 mins  Dessie Coma, M.S. OTR/L  10/12/19, 10:01 AM

## 2019-10-12 NOTE — Progress Notes (Signed)
[facility] Daily Progress Note   Subjective: 10/10/19: Right above-the-knee amputation  10/04/19: 1. Introduction catheter intorightlower extremity 3rd order catheter placement with additional third order 2.Contrast injectionrightlower extremity for distal runoff  3. Percutaneous transluminal angioplastyto 4 mm with Lutonix drug-eluting balloon mid to distal popliteal to 4 mm4. Percutaneous transluminal angioplastyto 3 mm right anterior tibial.  5. Percutaneous transluminal angioplasty to 2.5 mm right peroneal artery. 6.Mechanical thrombectomy of the right SFA and popliteal using the penumbra CAT 6 device in association with 10 mg of TPA. 7.Star close closureleftcommon femoral arteriotomy  Patient more alert this AM.  Notes pain is well controlled.  No issues overnight.  Objective: Vitals:   10/11/19 2359 10/12/19 0429 10/12/19 0611 10/12/19 1258  BP: 123/60 119/77 117/75 (!) 112/59  Pulse: (!) 109 (!) 108 98 80  Resp: 19 20  18   Temp: 98.7 F (37.1 C) 97.7 F (36.5 C)  98.1 F (36.7 C)  TempSrc: Oral Oral  Oral  SpO2: 100% 98%  100%  Weight:      Height:        Intake/Output Summary (Last 24 hours) at 10/12/2019 1349 Last data filed at 10/12/2019 1312 Gross per 24 hour  Intake 100 ml  Output 1400 ml  Net -1300 ml   Physical Exam: A&Ox3, NAD CV: RRR Pulmonary: CTA Bilaterally Abdomen: Soft, Nontender, Nondistended Vascular: Right lower extremity:OR dressing clean dry intact.  Thigh is soft.    Laboratory: CBC    Component Value Date/Time   WBC 13.7 (H) 10/12/2019 0415   HGB 9.0 (L) 10/12/2019 0415   HCT 26.8 (L) 10/12/2019 0415   PLT 342 10/12/2019 0415   BMET    Component Value Date/Time   NA 138 10/12/2019 0415   NA 141 08/03/2011 0729   K 4.4 10/12/2019 0415   K 3.6 08/03/2011 0729   CL 99 10/12/2019 0415   CL 105  08/03/2011 0729   CO2 26 10/12/2019 0415   CO2 26 08/03/2011 0729   GLUCOSE 189 (H) 10/12/2019 0415   GLUCOSE 149 (H) 08/03/2011 0729   BUN 47 (H) 10/12/2019 0415   BUN 16 08/03/2011 0729   CREATININE 1.80 (H) 10/12/2019 0415   CREATININE 1.07 08/03/2011 0729   CALCIUM 8.7 (L) 10/12/2019 0415   CALCIUM 9.5 08/03/2011 0729   GFRNONAA 24 (L) 10/12/2019 0415   GFRNONAA 48 (L) 08/03/2011 0729   GFRAA 28 (L) 10/12/2019 0415   GFRAA 56 (L) 08/03/2011 0729   Assessment/Planning: The patient is a 86 year old female with chronic ischemia to the right lower extremity.  Status post right above-the-knee amputation - POD#2  1) Patient with chronic kidney disease.  Watch renal function. 2) Hemoglobin relatively stable. Continue to monitor CBC. 3) Will order PT/OT.  Would expect patient to be discharged to SNF. 4) Will plan on OR dressing removal either Saturday or Sunday. 5) Bowel regimen started today.  Discussed with Dr. Eber Hong Arietta Eisenstein PA-C 10/12/2019 1:49 PM

## 2019-10-12 NOTE — TOC Progression Note (Addendum)
Transition of Care [facility]) - Progression Note    Patient Details  Name: [name] MRN: [id number] Date of Birth: [birth year]  Transition of Care [facility]) CM/SW [place], LCSW Phone Number: 10/12/2019, 1:37 PM  Clinical Narrative:   Uploaded today's PT and OT evaluations into [employer] portal for insurance authorization review.  4:28 pm: Insurance authorization approved: [id number]. Valid through 7/6.  Expected Discharge Plan: [place] Barriers to Discharge: Continued Medical Work up  Expected Discharge Plan and Services Expected Discharge Plan: [place]   Discharge Planning Services: CM Consult   Living arrangements for the past 2 months: Single Family Home                                       Social Determinants of Health (SDOH) Interventions    Readmission Risk Interventions No flowsheet data found.

## 2019-10-12 NOTE — Anesthesia Postprocedure Evaluation (Signed)
Anesthesia Post Note  Patient: Shanley Furlough Rodriquez  Procedure(s) Performed: AMPUTATION ABOVE KNEE (Right Knee)  Patient location during evaluation: PACU Anesthesia Type: General Level of consciousness: awake and alert Pain management: pain level controlled Vital Signs Assessment: post-procedure vital signs reviewed and stable Respiratory status: spontaneous breathing, nonlabored ventilation, respiratory function stable and patient connected to nasal cannula oxygen Cardiovascular status: blood pressure returned to baseline and stable Postop Assessment: no apparent nausea or vomiting Anesthetic complications: no   No complications documented.   Last Vitals:  Vitals:   10/11/19 2359 10/12/19 0429  BP: 123/60 119/77  Pulse: (!) 109 (!) 108  Resp: 19 20  Temp: 37.1 C 36.5 C  SpO2: 100% 98%    Last Pain:  Vitals:   10/12/19 0429  TempSrc: Oral  PainSc:                  Martha Clan

## 2019-10-13 LAB — GLUCOSE, CAPILLARY
Glucose-Capillary: 168 mg/dL — ABNORMAL HIGH (ref 70–99)
Glucose-Capillary: 255 mg/dL — ABNORMAL HIGH (ref 70–99)
Glucose-Capillary: 135 mg/dL — ABNORMAL HIGH (ref 70–99)
Glucose-Capillary: 160 mg/dL — ABNORMAL HIGH (ref 70–99)

## 2019-10-13 LAB — CBC
HCT: 28.1 % — ABNORMAL LOW (ref 36.0–46.0)
Hemoglobin: 8.8 g/dL — ABNORMAL LOW (ref 12.0–15.0)
MCH: 29.3 pg (ref 26.0–34.0)
MCHC: 31.3 g/dL (ref 30.0–36.0)
MCV: 93.7 fL (ref 80.0–100.0)
Platelets: 331 10*3/uL (ref 150–400)
RBC: 3 MIL/uL — ABNORMAL LOW (ref 3.87–5.11)
RDW: 13.5 % (ref 11.5–15.5)
WBC: 15.6 10*3/uL — ABNORMAL HIGH (ref 4.0–10.5)
nRBC: 0 % (ref 0.0–0.2)

## 2019-10-13 LAB — BASIC METABOLIC PANEL
Anion gap: 8 (ref 5–15)
BUN: 46 mg/dL — ABNORMAL HIGH (ref 8–23)
CO2: 31 mmol/L (ref 22–32)
Calcium: 8.9 mg/dL (ref 8.9–10.3)
Chloride: 98 mmol/L (ref 98–111)
Creatinine, Ser: 1.77 mg/dL — ABNORMAL HIGH (ref 0.44–1.00)
GFR calc Af Amer: 28 mL/min — ABNORMAL LOW (ref >60)
GFR calc non Af Amer: 25 mL/min — ABNORMAL LOW (ref >60)
Glucose, Bld: 181 mg/dL — ABNORMAL HIGH (ref 70–99)
Potassium: 4.9 mmol/L (ref 3.5–5.1)
Sodium: 137 mmol/L (ref 135–145)

## 2019-10-13 LAB — MAGNESIUM: Magnesium: 2 mg/dL (ref 1.7–2.4)

## 2019-10-13 MED ORDER — FLEET ENEMA 7-19 GM/118ML RE ENEM
1.0000 | ENEMA | Freq: Once | RECTAL | Status: AC
  Administered 2019-10-13: 1 via RECTAL

## 2019-10-13 NOTE — Progress Notes (Signed)
Subjective  - POD #3, s/p right AKA   Patient is more lethargic today   Physical Exam:  Right AKA dressing remains clean and dry. Not very interactive today       Assessment/Plan:  POD #3  A bed was available for discharge at her skilled nursing facility however the patient and her family are reluctant about going today because of her overall condition.  Therefore we will consider discharge tomorrow or the next day if she is doing better.  Acute blood loss anemia: Hemoglobin remains stable.  I will continue to monitor this with repeat CBC in the morning  Patient will be given enema to help her have a bowel movement  Daughter and son are present at the bedside  [name] 10/13/2019 2:45 PM --  Vitals:   10/13/19 0751 10/13/19 1159  BP: 99/62 (!) 98/47  Pulse: 87 81  Resp:  14  Temp:  98.6 F (37 C)  SpO2:  98%    Intake/Output Summary (Last 24 hours) at 10/13/2019 1445 Last data filed at 10/13/2019 1300 Gross per 24 hour  Intake 190.06 ml  Output 700 ml  Net -509.94 ml     Laboratory CBC    Component Value Date/Time   WBC 15.6 (H) 10/13/2019 0536   HGB 8.8 (L) 10/13/2019 0536   HCT 28.1 (L) 10/13/2019 0536   PLT 331 10/13/2019 0536    BMET    Component Value Date/Time   NA 137 10/13/2019 0536   NA 141 08/03/2011 0729   K 4.9 10/13/2019 0536   K 3.6 08/03/2011 0729   CL 98 10/13/2019 0536   CL 105 08/03/2011 0729   CO2 31 10/13/2019 0536   CO2 26 08/03/2011 0729   GLUCOSE 181 (H) 10/13/2019 0536   GLUCOSE 149 (H) 08/03/2011 0729   BUN 46 (H) 10/13/2019 0536   BUN 16 08/03/2011 0729   CREATININE 1.77 (H) 10/13/2019 0536   CREATININE 1.07 08/03/2011 0729   CALCIUM 8.9 10/13/2019 0536   CALCIUM 9.5 08/03/2011 0729   GFRNONAA 25 (L) 10/13/2019 0536   GFRNONAA 48 (L) 08/03/2011 0729   GFRAA 28 (L) 10/13/2019 0536   GFRAA 56 (L) 08/03/2011 0729    COAG Lab Results  Component Value Date   INR 1.1 10/10/2019   INR 1.2 10/03/2019    INR 1.1 03/01/2019   No results found for: PTT  Antibiotics Anti-infectives (From admission, onward)   Start     Dose/Rate Route Frequency Ordered Stop   10/11/19 2200  ceFAZolin (ANCEF) IVPB 1 g/50 mL premix       Note to Pharmacy: Send with pt to OR   1 g 100 mL/hr over 30 Minutes Intravenous Every 12 hours 10/10/19 1340 10/12/19 2253   10/10/19 0929  ceFAZolin (ANCEF) 1-4 GM/50ML-% IVPB       Note to Pharmacy: Ronnell Freshwater   : cabinet override      10/10/19 0929 10/10/19 1032   10/10/19 0600  ceFAZolin (ANCEF) IVPB 1 g/50 mL premix        1 g 100 mL/hr over 30 Minutes Intravenous On call to O.R. 10/10/19 0214 10/10/19 1003   10/04/19 0000  ceFAZolin (ANCEF) IVPB 1 g/50 mL premix  Status:  Discontinued       Note to Pharmacy: To be given in specials   1 g 100 mL/hr over 30 Minutes Intravenous  Once 10/03/19 1711 10/04/19 0833       V. Leia Alf, M.D.,  FACS [facility] [place] Office: [phone] Pager:  [phone]

## 2019-10-13 NOTE — Progress Notes (Addendum)
PT Cancellation Note  Patient Details Name: [name] Issa MRN: [id number] DOB: [birth year]   Cancelled Treatment:     PT attempt. 2nd attempt today. Pt very weak and lethargic requesting not to have PT. Pt's daughter in room and reports concerns about pt's motivation/lethargy this date. Therapist educated pt/pt's family that she has been through a lot in past few days and its not uncommon for pt to be feeling the way she is at her advanced age. Pt and family was educated on proper positioning while pt is in bed. Pt did have hips flexed in bed against recommendations upon arriving.  Acute PT will continue to follow and progress as able per POC.     Willette Pa 10/13/2019, 1:56 PM

## 2019-10-14 ENCOUNTER — Inpatient Hospital Stay: Payer: Medicare Other

## 2019-10-14 LAB — MAGNESIUM: Magnesium: 2.2 mg/dL (ref 1.7–2.4)

## 2019-10-14 LAB — URINALYSIS, COMPLETE (UACMP) WITH MICROSCOPIC
Bacteria, UA: NONE SEEN
Bilirubin Urine: NEGATIVE
Glucose, UA: NEGATIVE mg/dL
Hgb urine dipstick: NEGATIVE
Ketones, ur: NEGATIVE mg/dL
Leukocytes,Ua: NEGATIVE
Nitrite: NEGATIVE
Protein, ur: NEGATIVE mg/dL
Specific Gravity, Urine: 1.013 (ref 1.005–1.030)
pH: 5 (ref 5.0–8.0)

## 2019-10-14 LAB — GLUCOSE, CAPILLARY
Glucose-Capillary: 178 mg/dL — ABNORMAL HIGH (ref 70–99)
Glucose-Capillary: 214 mg/dL — ABNORMAL HIGH (ref 70–99)
Glucose-Capillary: 111 mg/dL — ABNORMAL HIGH (ref 70–99)
Glucose-Capillary: 236 mg/dL — ABNORMAL HIGH (ref 70–99)

## 2019-10-14 LAB — CBC
HCT: 28.7 % — ABNORMAL LOW (ref 36.0–46.0)
Hemoglobin: 9.4 g/dL — ABNORMAL LOW (ref 12.0–15.0)
MCH: 29.7 pg (ref 26.0–34.0)
MCHC: 32.8 g/dL (ref 30.0–36.0)
MCV: 90.8 fL (ref 80.0–100.0)
Platelets: 378 10*3/uL (ref 150–400)
RBC: 3.16 MIL/uL — ABNORMAL LOW (ref 3.87–5.11)
RDW: 13.8 % (ref 11.5–15.5)
WBC: 18 10*3/uL — ABNORMAL HIGH (ref 4.0–10.5)
nRBC: 0 % (ref 0.0–0.2)

## 2019-10-14 LAB — BASIC METABOLIC PANEL
Anion gap: 10 (ref 5–15)
BUN: 59 mg/dL — ABNORMAL HIGH (ref 8–23)
CO2: 30 mmol/L (ref 22–32)
Calcium: 9.1 mg/dL (ref 8.9–10.3)
Chloride: 96 mmol/L — ABNORMAL LOW (ref 98–111)
Creatinine, Ser: 1.84 mg/dL — ABNORMAL HIGH (ref 0.44–1.00)
GFR calc Af Amer: 27 mL/min — ABNORMAL LOW (ref >60)
GFR calc non Af Amer: 23 mL/min — ABNORMAL LOW (ref >60)
Glucose, Bld: 248 mg/dL — ABNORMAL HIGH (ref 70–99)
Potassium: 4.9 mmol/L (ref 3.5–5.1)
Sodium: 136 mmol/L (ref 135–145)

## 2019-10-14 NOTE — Progress Notes (Signed)
MD aware of Patient BP of 88/48. UA and Chest x-ray ordered.

## 2019-10-14 NOTE — Progress Notes (Signed)
    Subjective  - POD #4  No new events Still does not feel normal Family at bedside and do not think she is back to her baseline   Physical Exam:  Amputation dressing changed.  Wound looks healthy and clean       Assessment/Plan:  POD #4  WBC elevated to 18 this am.  UA was negative.  CXR was unremarkable.  No obvious source of leukocytosis.  She is afebrile.  Will not d/c to SNF toady.  Repeat CBC in am.  No role for abx at this time.  Wells Gearline Spilman 10/14/2019 2:48 PM --  Vitals:   10/14/19 0322 10/14/19 1153  BP: (!) 101/50 (!) 88/48  Pulse: 89 79  Resp: 16 16  Temp: 98.2 F (36.8 C) 97.7 F (36.5 C)  SpO2: 95% 100%    Intake/Output Summary (Last 24 hours) at 10/14/2019 1448 Last data filed at 10/14/2019 1300 Gross per 24 hour  Intake 0 ml  Output 0 ml  Net 0 ml     Laboratory CBC    Component Value Date/Time   WBC 18.0 (H) 10/14/2019 0428   HGB 9.4 (L) 10/14/2019 0428   HCT 28.7 (L) 10/14/2019 0428   PLT 378 10/14/2019 0428    BMET    Component Value Date/Time   NA 136 10/14/2019 0428   NA 141 08/03/2011 0729   K 4.9 10/14/2019 0428   K 3.6 08/03/2011 0729   CL 96 (L) 10/14/2019 0428   CL 105 08/03/2011 0729   CO2 30 10/14/2019 0428   CO2 26 08/03/2011 0729   GLUCOSE 248 (H) 10/14/2019 0428   GLUCOSE 149 (H) 08/03/2011 0729   BUN 59 (H) 10/14/2019 0428   BUN 16 08/03/2011 0729   CREATININE 1.84 (H) 10/14/2019 0428   CREATININE 1.07 08/03/2011 0729   CALCIUM 9.1 10/14/2019 0428   CALCIUM 9.5 08/03/2011 0729   GFRNONAA 23 (L) 10/14/2019 0428   GFRNONAA 48 (L) 08/03/2011 0729   GFRAA 27 (L) 10/14/2019 0428   GFRAA 56 (L) 08/03/2011 0729    COAG Lab Results  Component Value Date   INR 1.1 10/10/2019   INR 1.2 10/03/2019   INR 1.1 03/01/2019   No results found for: PTT  Antibiotics Anti-infectives (From admission, onward)   Start     Dose/Rate Route Frequency Ordered Stop   10/11/19 2200  ceFAZolin (ANCEF) IVPB 1 g/50 mL premix        Note to Pharmacy: Send with pt to OR   1 g 100 mL/hr over 30 Minutes Intravenous Every 12 hours 10/10/19 1340 10/12/19 2253   10/10/19 0929  ceFAZolin (ANCEF) 1-4 GM/50ML-% IVPB       Note to Pharmacy: Ronnell Freshwater   : cabinet override      10/10/19 0929 10/10/19 1032   10/10/19 0600  ceFAZolin (ANCEF) IVPB 1 g/50 mL premix        1 g 100 mL/hr over 30 Minutes Intravenous On call to O.R. 10/10/19 0214 10/10/19 1003   10/04/19 0000  ceFAZolin (ANCEF) IVPB 1 g/50 mL premix  Status:  Discontinued       Note to Pharmacy: To be given in specials   1 g 100 mL/hr over 30 Minutes Intravenous  Once 10/03/19 1711 10/04/19 0833       V. Leia Alf, M.D., [facility] [place] Office: [phone] Pager:  [phone]

## 2019-10-14 NOTE — Progress Notes (Signed)
Physical Therapy Treatment Patient Details Name: Anvi Mangal Janak MRN: [id number] DOB: April 23, 1925 Today's Date: 10/14/2019    History of Present Illness Ms. Plotz is a 86 y/o female who is s/p R AKA on 10/10/19 during same admission from having been admitted for limb threatening ischemia s/p RLE distal popliteal and R anterior tibial angioplasty, R SFA and popliteal thrombectomy, and femoral arteriotomy on 10/03/19. PMHx includes Afib, CHF, DM, HTN, HLD.     PT Comments    Patient is progressing well. She was lethargic at start of session complaining of increased RLE discomfort. Instructed patient in LE ROM exercise. Patient and family also re-educated on proper positioning to avoid hip flexor contractures. Patient is not appropriate for prone positioning due to age and difficulty breathing. Educated family in importance of having patient in supine position at times to help stretch front of thigh rather than always having head of bed elevated. Patient did have low BP during session. PT assessed BP with movement including sitting edge of bed. Patient's BP was 85/60 upon sitting. She did report mild dizziness initially but with 2nd bout of sitting reports no dizziness. Patient was able to progress sitting edge of bed to close supervision with better trunk control and positioning. She does require mod A for most bed mobility and requires increased time/effort. Patient would benefit from additional skilled PT Intervention to improve strength, balance and mobility;    Follow Up Recommendations  SNF     Equipment Recommendations  None recommended by PT    Recommendations for Other Services       Precautions / Restrictions Precautions Precautions: Fall Restrictions Weight Bearing Restrictions: Yes RLE Weight Bearing: Non weight bearing    Mobility  Bed Mobility Overal bed mobility: Needs Assistance Bed Mobility: Rolling;Supine to Sit;Sit to Supine Rolling: Mod assist   Supine to sit: Mod  assist Sit to supine: Mod assist   General bed mobility comments: patient transitioned sitting edge of bed x2 reps; During initial sitting she was dizzy; 2nd bout she was able to sit edge of bed for 2-3 min; assessed vitals, see above; She was able to progress balance to close supervision with BUE support; Patient is able to lift LLE in/out of bed without assistance but does require assistance for trunk control; She also required mod A +2 for scooting up in bed; Re-educated patient and family in proper positioning to avoid hip flexor contractures;  Transfers                 General transfer comment: unable at this time;  Ambulation/Gait                 Stairs             Wheelchair Mobility    Modified Rankin (Stroke Patients Only)       Balance Overall balance assessment: Needs assistance Sitting-balance support: Bilateral upper extremity supported Sitting balance-[name] Scale: Fair Sitting balance - Comments: progressed to close supervision with BUE support on bed;                                    Cognition Arousal/Alertness: Lethargic Behavior During Therapy: WFL for tasks assessed/performed Overall Cognitive Status: Within Functional Limits for tasks assessed  Exercises General Exercises - Lower Extremity Gluteal Sets: AROM;Both;10 reps;Supine Heel Slides: AROM;Left;10 reps;Supine Hip ABduction/ADduction: AROM;Left;10 reps;Supine Other Exercises Other Exercises: Patient educated in proper RLE positioning including to lay in supine position with less head of bed elevation to reduce hip flexor contractures; Other Exercises: instructed patient in LE ROM/strengthening exercise. She was more lethargic today requiring increased cues for task orientation and proper exercise technique; unable to tolerate RLE movement due to pain;    General Comments        Pertinent Vitals/Pain Pain  Assessment: 0-10 Pain Score: 6  Pain Location: RLE Pain Descriptors / Indicators: Aching;Discomfort Pain Intervention(s): Limited activity within patient's tolerance;Monitored during session;Repositioned    Home Living                      Prior Function            PT Goals (current goals can now be found in the care plan section) Acute Rehab PT Goals Patient Stated Goal: to get better and go home from hospital PT Goal Formulation: With patient Time For Goal Achievement: 10/26/19 Potential to Achieve Goals: Fair Progress towards PT goals: Progressing toward goals    Frequency    7X/week      PT Plan      Co-evaluation              AM-PAC PT "6 Clicks" Mobility   Outcome Measure  Help needed turning from your back to your side while in a flat bed without using bedrails?: A Lot Help needed moving from lying on your back to sitting on the side of a flat bed without using bedrails?: A Lot Help needed moving to and from a bed to a chair (including a wheelchair)?: Total Help needed standing up from a chair using your arms (e.g., wheelchair or bedside chair)?: Total Help needed to walk in hospital room?: Total Help needed climbing 3-5 steps with a railing? : Total 6 Click Score: 8    End of Session   Activity Tolerance: Patient limited by fatigue;Patient limited by pain Patient left: in bed;with call bell/phone within reach;with bed alarm set Nurse Communication: Mobility status PT Visit Diagnosis: Unsteadiness on feet (R26.81);Muscle weakness (generalized) (M62.81);Pain Pain - Right/Left: Right Pain - part of body: Leg     Time: 3354-5625 PT Time Calculation (min) (ACUTE ONLY): 28 min  Charges:  $Therapeutic Exercise: 8-22 mins $Therapeutic Activity: 8-22 mins                       Zyan Mirkin PT, DPT 10/14/2019, 4:25 PM

## 2019-10-15 LAB — TYPE AND SCREEN
ABO/RH(D): O NEG
Antibody Screen: NEGATIVE
Unit division: 0
Unit division: 0

## 2019-10-15 LAB — URINALYSIS, COMPLETE (UACMP) WITH MICROSCOPIC
Bilirubin Urine: NEGATIVE
Glucose, UA: NEGATIVE mg/dL
Hgb urine dipstick: NEGATIVE
Ketones, ur: NEGATIVE mg/dL
Leukocytes,Ua: NEGATIVE
Nitrite: NEGATIVE
Protein, ur: NEGATIVE mg/dL
Specific Gravity, Urine: 1.013 (ref 1.005–1.030)
pH: 5 (ref 5.0–8.0)

## 2019-10-15 LAB — CBC
HCT: 25.3 % — ABNORMAL LOW (ref 36.0–46.0)
Hemoglobin: 8.5 g/dL — ABNORMAL LOW (ref 12.0–15.0)
MCH: 29.8 pg (ref 26.0–34.0)
MCHC: 33.6 g/dL (ref 30.0–36.0)
MCV: 88.8 fL (ref 80.0–100.0)
Platelets: 370 10*3/uL (ref 150–400)
RBC: 2.85 MIL/uL — ABNORMAL LOW (ref 3.87–5.11)
RDW: 14 % (ref 11.5–15.5)
WBC: 21.4 10*3/uL — ABNORMAL HIGH (ref 4.0–10.5)
nRBC: 0 % (ref 0.0–0.2)

## 2019-10-15 LAB — BPAM RBC
Blood Product Expiration Date: 202107282359
Blood Product Expiration Date: 202108022359
Unit Type and Rh: 9500
Unit Type and Rh: 9500

## 2019-10-15 LAB — BASIC METABOLIC PANEL
Anion gap: 10 (ref 5–15)
BUN: 72 mg/dL — ABNORMAL HIGH (ref 8–23)
CO2: 27 mmol/L (ref 22–32)
Calcium: 8.7 mg/dL — ABNORMAL LOW (ref 8.9–10.3)
Chloride: 96 mmol/L — ABNORMAL LOW (ref 98–111)
Creatinine, Ser: 2.34 mg/dL — ABNORMAL HIGH (ref 0.44–1.00)
GFR calc Af Amer: 20 mL/min — ABNORMAL LOW (ref >60)
GFR calc non Af Amer: 17 mL/min — ABNORMAL LOW (ref >60)
Glucose, Bld: 164 mg/dL — ABNORMAL HIGH (ref 70–99)
Potassium: 5.5 mmol/L — ABNORMAL HIGH (ref 3.5–5.1)
Sodium: 133 mmol/L — ABNORMAL LOW (ref 135–145)

## 2019-10-15 LAB — GLUCOSE, CAPILLARY
Glucose-Capillary: 145 mg/dL — ABNORMAL HIGH (ref 70–99)
Glucose-Capillary: 227 mg/dL — ABNORMAL HIGH (ref 70–99)
Glucose-Capillary: 137 mg/dL — ABNORMAL HIGH (ref 70–99)
Glucose-Capillary: 97 mg/dL (ref 70–99)

## 2019-10-15 LAB — MAGNESIUM: Magnesium: 2.3 mg/dL (ref 1.7–2.4)

## 2019-10-15 MED ORDER — SODIUM CHLORIDE 0.9 % IV BOLUS
500.0000 mL | Freq: Once | INTRAVENOUS | Status: AC
  Administered 2019-10-15: 500 mL via INTRAVENOUS

## 2019-10-15 NOTE — Progress Notes (Signed)
PT Cancellation Note  Patient Details Name: [name] MRN: [id number] DOB: [birth year]   Cancelled Treatment:    Reason Eval/Treat Not Completed: Medical issues which prohibited therapy   Chart reviewed.  Potassium noted to be 5.5 this am.  Will hold therapy per protocols and continue as appropriate.   Chesley Noon 10/15/2019, 1:13 PM

## 2019-10-15 NOTE — Progress Notes (Signed)
OT Cancellation Note  Patient Details Name: [name] MRN: [id number] DOB: [birth year]   Cancelled Treatment:    Reason Eval/Treat Not Completed: Medical issues which prohibited therapy. Pt's potassium noted to be critically elevated (5.5) at this time. Hold and r-attempt at later date/time as pt is able and as medically appropriate.   Jeni Salles, MPH, MS, OTR/L ascom [phone] 10/15/19, 2:34 PM                                                           .

## 2019-10-15 NOTE — Care Management Important Message (Signed)
Important Message  Patient Details  Name: [name] MRN: [id number] Date of Birth: Jun 08, 1925   Medicare Important Message Given:  Yes     [name] 10/15/2019, 11:13 AM

## 2019-10-15 NOTE — Progress Notes (Signed)
Subjective  - POD #5  Sat in the chair yesterday and was more lucid last night, however daughter is concerned that she is still not right this morning  She had a bowel movement this morning  She is complaining of pain in her left leg   Physical Exam:  Right above-knee amputation dressing was changed.  The incision site is healing nicely. Sacral area was examined.  There is no evidence of decubitus. Abdomen is soft and nontender       Assessment/Plan:  POD #  Acute renal insufficiency: Etiology is uncertain at this time.  ACE inhibitor has been held. Leukocytosis: Chest x-ray was unremarkable yesterday as well as her urine.  She remains afebrile.  She does not have a central line in place. We will consult hospitalist service for assistance with patient.  [name] 10/15/2019 10:42 AM --  Vitals:   10/15/19 0412 10/15/19 0827  BP: (!) 99/47 (!) 106/58  Pulse: 91 (!) 102  Resp: 15   Temp: 98.1 F (36.7 C)   SpO2: 95%     Intake/Output Summary (Last 24 hours) at 10/15/2019 1042 Last data filed at 10/15/2019 1014 Gross per 24 hour  Intake 960 ml  Output 600 ml  Net 360 ml     Laboratory CBC    Component Value Date/Time   WBC 21.4 (H) 10/15/2019 0604   HGB 8.5 (L) 10/15/2019 0604   HCT 25.3 (L) 10/15/2019 0604   PLT 370 10/15/2019 0604    BMET    Component Value Date/Time   NA 133 (L) 10/15/2019 0604   NA 141 08/03/2011 0729   K 5.5 (H) 10/15/2019 0604   K 3.6 08/03/2011 0729   CL 96 (L) 10/15/2019 0604   CL 105 08/03/2011 0729   CO2 27 10/15/2019 0604   CO2 26 08/03/2011 0729   GLUCOSE 164 (H) 10/15/2019 0604   GLUCOSE 149 (H) 08/03/2011 0729   BUN 72 (H) 10/15/2019 0604   BUN 16 08/03/2011 0729   CREATININE 2.34 (H) 10/15/2019 0604   CREATININE 1.07 08/03/2011 0729   CALCIUM 8.7 (L) 10/15/2019 0604   CALCIUM 9.5 08/03/2011 0729   GFRNONAA 17 (L) 10/15/2019 0604   GFRNONAA 48 (L) 08/03/2011 0729   GFRAA 20 (L) 10/15/2019 0604   GFRAA 56  (L) 08/03/2011 0729    COAG Lab Results  Component Value Date   INR 1.1 10/10/2019   INR 1.2 10/03/2019   INR 1.1 03/01/2019   No results found for: PTT  Antibiotics Anti-infectives (From admission, onward)   Start     Dose/Rate Route Frequency Ordered Stop   10/11/19 2200  ceFAZolin (ANCEF) IVPB 1 g/50 mL premix       Note to Pharmacy: Send with pt to OR   1 g 100 mL/hr over 30 Minutes Intravenous Every 12 hours 10/10/19 1340 10/12/19 2253   10/10/19 0929  ceFAZolin (ANCEF) 1-4 GM/50ML-% IVPB       Note to Pharmacy: Ronnell Freshwater   : cabinet override      10/10/19 0929 10/10/19 1032   10/10/19 0600  ceFAZolin (ANCEF) IVPB 1 g/50 mL premix        1 g 100 mL/hr over 30 Minutes Intravenous On call to O.R. 10/10/19 0214 10/10/19 1003   10/04/19 0000  ceFAZolin (ANCEF) IVPB 1 g/50 mL premix  Status:  Discontinued       Note to Pharmacy: To be given in specials   1 g 100 mL/hr over 30  Minutes Intravenous  Once 10/03/19 1711 10/04/19 0833       V. Leia Alf, M.D., [facility] [place] Office: [phone] Pager:  [phone]

## 2019-10-16 LAB — BASIC METABOLIC PANEL
Anion gap: 10 (ref 5–15)
BUN: 75 mg/dL — ABNORMAL HIGH (ref 8–23)
CO2: 27 mmol/L (ref 22–32)
Calcium: 8.4 mg/dL — ABNORMAL LOW (ref 8.9–10.3)
Chloride: 96 mmol/L — ABNORMAL LOW (ref 98–111)
Creatinine, Ser: 2.31 mg/dL — ABNORMAL HIGH (ref 0.44–1.00)
GFR calc Af Amer: 21 mL/min — ABNORMAL LOW (ref >60)
GFR calc non Af Amer: 18 mL/min — ABNORMAL LOW (ref >60)
Glucose, Bld: 108 mg/dL — ABNORMAL HIGH (ref 70–99)
Potassium: 5.2 mmol/L — ABNORMAL HIGH (ref 3.5–5.1)
Sodium: 133 mmol/L — ABNORMAL LOW (ref 135–145)

## 2019-10-16 LAB — CBC
HCT: 23.3 % — ABNORMAL LOW (ref 36.0–46.0)
Hemoglobin: 7.7 g/dL — ABNORMAL LOW (ref 12.0–15.0)
MCH: 29.7 pg (ref 26.0–34.0)
MCHC: 33 g/dL (ref 30.0–36.0)
MCV: 90 fL (ref 80.0–100.0)
Platelets: 344 10*3/uL (ref 150–400)
RBC: 2.59 MIL/uL — ABNORMAL LOW (ref 3.87–5.11)
RDW: 14.2 % (ref 11.5–15.5)
WBC: 15.3 10*3/uL — ABNORMAL HIGH (ref 4.0–10.5)
nRBC: 0 % (ref 0.0–0.2)

## 2019-10-16 LAB — GLUCOSE, CAPILLARY
Glucose-Capillary: 96 mg/dL (ref 70–99)
Glucose-Capillary: 204 mg/dL — ABNORMAL HIGH (ref 70–99)
Glucose-Capillary: 173 mg/dL — ABNORMAL HIGH (ref 70–99)
Glucose-Capillary: 107 mg/dL — ABNORMAL HIGH (ref 70–99)

## 2019-10-16 LAB — MAGNESIUM: Magnesium: 2.6 mg/dL — ABNORMAL HIGH (ref 1.7–2.4)

## 2019-10-16 MED ORDER — CHLORHEXIDINE GLUCONATE CLOTH 2 % EX PADS
6.0000 | MEDICATED_PAD | Freq: Every day | CUTANEOUS | Status: DC
  Administered 2019-10-16 – 2019-10-20 (×4): 6 via TOPICAL

## 2019-10-16 NOTE — Progress Notes (Signed)
[facility]  Daily Progress Note   Subjective  - 6 Days Post-Op  The patient is noting some increased pain in her thigh she denies phantom pains.  Objective Vitals:   10/16/19 0435 10/16/19 1135 10/16/19 1233 10/16/19 1651  BP: (!) 102/51 (!) 99/44  (!) 101/51  Pulse: 91 65  88  Resp: 20 16    Temp: 98 F (36.7 C) 98 F (36.7 C)    TempSrc: Oral Oral    SpO2: 98% (!) 49% 90%   Weight:      Height:        Intake/Output Summary (Last 24 hours) at 10/16/2019 1905 Last data filed at 10/16/2019 0900 Gross per 24 hour  Intake 120 ml  Output 225 ml  Net -105 ml    PULM  Normal effort , no use of accessory muscles CV  No JVD, RRR Abd      No distended, nontender VASC dressing is taken down there is a small amount of ecchymoses but the suture line is clean dry and intact there is no significant edema.  Rewrapping with a gauze and Ace was tolerated quite well no point tenderness  Laboratory CBC    Component Value Date/Time   WBC 15.3 (H) 10/16/2019 0503   HGB 7.7 (L) 10/16/2019 0503   HCT 23.3 (L) 10/16/2019 0503   PLT 344 10/16/2019 0503    BMET    Component Value Date/Time   NA 133 (L) 10/16/2019 0503   NA 141 08/03/2011 0729   K 5.2 (H) 10/16/2019 0503   K 3.6 08/03/2011 0729   CL 96 (L) 10/16/2019 0503   CL 105 08/03/2011 0729   CO2 27 10/16/2019 0503   CO2 26 08/03/2011 0729   GLUCOSE 108 (H) 10/16/2019 0503   GLUCOSE 149 (H) 08/03/2011 0729   BUN 75 (H) 10/16/2019 0503   BUN 16 08/03/2011 0729   CREATININE 2.31 (H) 10/16/2019 0503   CREATININE 1.07 08/03/2011 0729   CALCIUM 8.4 (L) 10/16/2019 0503   CALCIUM 9.5 08/03/2011 0729   GFRNONAA 18 (L) 10/16/2019 0503   GFRNONAA 48 (L) 08/03/2011 0729   GFRAA 21 (L) 10/16/2019 0503   GFRAA 56 (L) 08/03/2011 0729    Assessment/Planning: POD #6 s/p right above-knee amputation   With respect to her amputation site I do not have any concerns regarding infection.  Thus far her work-up has  been negative to identify a source of infection.  We will continue supportive care for now    [name]  10/16/2019, 7:05 PM

## 2019-10-16 NOTE — Progress Notes (Signed)
OT Cancellation Note  Patient Details Name: [name] MRN: [id number] DOB: 02-24-1926   Cancelled Treatment:    Reason Eval/Treat Not Completed: Medical issues which prohibited therapy. Potassium remains critically elevated (5.2) and contraindicated for participation in therapy at this time. Will re-attempt at later date/time as pt is medically appropriate.   Jeni Salles, MPH, MS, OTR/L ascom [phone] 10/16/19, 10:18 AM

## 2019-10-16 NOTE — TOC Progression Note (Signed)
Transition of Care [facility]) - Progression Note    Patient Details  Name: [name] MRN: [id number] Date of Birth: [birth year]  [facility] CM/SW Contact  Candie Chroman, LCSW Phone Number: 10/16/2019, 3:37 PM  Clinical Narrative: Patient's insurance authorization for SNF expires after today. Please notify CSW 24-48 hours before discharge so a new authorization can be started.    Expected Discharge Plan: [place] Barriers to Discharge: Continued Medical Work up  Expected Discharge Plan and Services Expected Discharge Plan: [place]   Discharge Planning Services: CM Consult   Living arrangements for the past 2 months: Single Family Home                                       Social Determinants of Health (SDOH) Interventions    Readmission Risk Interventions No flowsheet data found.

## 2019-10-16 NOTE — Progress Notes (Signed)
PT Cancellation Note  Patient Details Name: [name] MRN: [id number] DOB: 1926-01-24   Cancelled Treatment:      PT hold. Pt continues to have lab values that do not meet PT protocols. Will hold PT this date but continue to follow per POC.   Willette Pa 10/16/2019, 2:08 PM

## 2019-10-17 LAB — URINALYSIS, COMPLETE (UACMP) WITH MICROSCOPIC
Bilirubin Urine: NEGATIVE
Glucose, UA: NEGATIVE mg/dL
Ketones, ur: NEGATIVE mg/dL
Nitrite: NEGATIVE
Protein, ur: 30 mg/dL — AB
Specific Gravity, Urine: 1.008 (ref 1.005–1.030)
Squamous Epithelial / HPF: NONE SEEN (ref 0–5)
WBC, UA: >50 WBC/hpf — ABNORMAL HIGH (ref 0–5)
pH: 6 (ref 5.0–8.0)

## 2019-10-17 LAB — BASIC METABOLIC PANEL
Anion gap: 11 (ref 5–15)
BUN: 69 mg/dL — ABNORMAL HIGH (ref 8–23)
CO2: 26 mmol/L (ref 22–32)
Calcium: 8.6 mg/dL — ABNORMAL LOW (ref 8.9–10.3)
Chloride: 96 mmol/L — ABNORMAL LOW (ref 98–111)
Creatinine, Ser: 1.84 mg/dL — ABNORMAL HIGH (ref 0.44–1.00)
GFR calc Af Amer: 27 mL/min — ABNORMAL LOW (ref >60)
GFR calc non Af Amer: 23 mL/min — ABNORMAL LOW (ref >60)
Glucose, Bld: 223 mg/dL — ABNORMAL HIGH (ref 70–99)
Potassium: 5.2 mmol/L — ABNORMAL HIGH (ref 3.5–5.1)
Sodium: 133 mmol/L — ABNORMAL LOW (ref 135–145)

## 2019-10-17 LAB — GLUCOSE, CAPILLARY
Glucose-Capillary: 114 mg/dL — ABNORMAL HIGH (ref 70–99)
Glucose-Capillary: 223 mg/dL — ABNORMAL HIGH (ref 70–99)
Glucose-Capillary: 142 mg/dL — ABNORMAL HIGH (ref 70–99)
Glucose-Capillary: 110 mg/dL — ABNORMAL HIGH (ref 70–99)

## 2019-10-17 MED ORDER — SODIUM CHLORIDE 1 G PO TABS
1.0000 g | ORAL_TABLET | Freq: Two times a day (BID) | ORAL | Status: AC
  Administered 2019-10-17 (×2): 1 g via ORAL
  Filled 2019-10-17 (×2): qty 1

## 2019-10-17 MED ORDER — SODIUM CHLORIDE 1 G PO TABS: 1.0000 g | ORAL_TABLET | Freq: Two times a day (BID) | ORAL | Status: DC

## 2019-10-17 MED ORDER — SODIUM CHLORIDE 0.9 % IV SOLN: INTRAVENOUS | Status: DC

## 2019-10-17 MED ORDER — INSULIN ASPART 100 UNIT/ML ~~LOC~~ SOLN
2.0000 [IU] | Freq: Three times a day (TID) | SUBCUTANEOUS | Status: DC
  Administered 2019-10-17 – 2019-10-25 (×10): 2 [IU] via SUBCUTANEOUS
  Filled 2019-10-17 (×10): qty 1

## 2019-10-17 MED ORDER — SODIUM CHLORIDE 0.9 % IV SOLN: INTRAVENOUS | Status: AC | PRN

## 2019-10-17 MED ORDER — SODIUM ZIRCONIUM CYCLOSILICATE 5 G PO PACK
5.0000 g | PACK | Freq: Once | ORAL | Status: AC
  Administered 2019-10-17: 5 g via ORAL
  Filled 2019-10-17: qty 1

## 2019-10-17 NOTE — Progress Notes (Signed)
 Vein & Vascular Surgery Daily Progress Note   Subjective: 10/10/19: Right above-the-knee amputation  Patient with improved discomfort to her stump.  No issues overnight.  Remains afebrile.  Objective: Vitals:   10/17/19 0451 10/17/19 0857 10/17/19 1143 10/17/19 1607  BP: (!) 105/42 (!) 96/54 (!) 113/49 (!) 115/44  Pulse: 84 75 83 72  Resp: 20 16 16 20   Temp: 98 F (36.7 C) 97.6 F (36.4 C) 97.6 F (36.4 C) 98.3 F (36.8 C)  TempSrc: Oral Oral Oral Oral  SpO2: 97% 100% 99% 99%  Weight:      Height:        Intake/Output Summary (Last 24 hours) at 10/17/2019 1810 Last data filed at 10/17/2019 1600 Gross per 24 hour  Intake 243 ml  Output 1475 ml  Net -1232 ml   Physical Exam: A&Ox3, NAD CV: RRR Pulmonary: CTA Bilaterally Abdomen: Soft, Nontender, Nondistended Vascular: Right lower extremity:Thigh soft.  Stump is healthy.  Dressing is clean and dry.    Laboratory: CBC    Component Value Date/Time   WBC 15.3 (H) 10/16/2019 0503   HGB 7.7 (L) 10/16/2019 0503   HCT 23.3 (L) 10/16/2019 0503   PLT 344 10/16/2019 0503   BMET    Component Value Date/Time   NA 133 (L) 10/17/2019 1024   NA 141 08/03/2011 0729   K 5.2 (H) 10/17/2019 1024   K 3.6 08/03/2011 0729   CL 96 (L) 10/17/2019 1024   CL 105 08/03/2011 0729   CO2 26 10/17/2019 1024   CO2 26 08/03/2011 0729   GLUCOSE 223 (H) 10/17/2019 1024   GLUCOSE 149 (H) 08/03/2011 0729   BUN 69 (H) 10/17/2019 1024   BUN 16 08/03/2011 0729   CREATININE 1.84 (H) 10/17/2019 1024   CREATININE 1.07 08/03/2011 0729   CALCIUM 8.6 (L) 10/17/2019 1024   CALCIUM 9.5 08/03/2011 0729   GFRNONAA 23 (L) 10/17/2019 1024   GFRNONAA 48 (L) 08/03/2011 0729   GFRAA 27 (L) 10/17/2019 1024   GFRAA 56 (L) 08/03/2011 0729   Assessment/Planning: The patient is a 86 year old female with chronic ischemia to the right lower extremity. Status post right above-the-knee amputation - 10/10/19  1)Patient with chronic  kidney disease. Watch renal function. 2) Hemoglobin relatively stable. Continue to monitor CBC. 3)Will order PT/OT. Would expect patient to be discharged to SNF. 4) Patient with cloudy urine in Foley bag.  Will order urine culture/urinalysis.  If normal will plan on deseeding Foley tomorrow. 5) appreciate diabetes coordinator assistance in managing blood glucose.  Seen and examined with Tamera Stands PA-C 10/17/2019 6:10 PM

## 2019-10-17 NOTE — Progress Notes (Signed)
OT Cancellation Note  Patient Details Name: [name] MRN: [id number] DOB: 1926/02/06   Cancelled Treatment:    Reason Eval/Treat Not Completed: Medical issues which prohibited therapy. New labs completed this am. Pt continues to have elevated potassium, falling outside guidelines for participation in therapy. K+ 5.2. Will continue to hold. RN notified. Will re-attempt at later date/time as pt is medically appropriate.   Jeni Salles, MPH, MS, OTR/L ascom [phone] 10/17/19, 11:17 AM

## 2019-10-17 NOTE — Progress Notes (Signed)
PT Cancellation Note  Patient Details Name: [name] MRN: [id number] DOB: Dec 20, 1925   Cancelled Treatment:     Pt continues to have lab values concerning for safe participation with therapy. Will hold per protocols and continue efforts as lab values improve.    Willette Pa 10/17/2019, 12:01 PM

## 2019-10-17 NOTE — Progress Notes (Signed)
Inpatient Diabetes Program Recommendations  AACE/ADA: New Consensus Statement on Inpatient Glycemic Control   Target Ranges:  Prepandial:   less than 140 mg/dL      Peak postprandial:   less than 180 mg/dL (1-2 hours)      Critically ill patients:  140 - 180 mg/dL  Results for RICKETTA, COLANTONIO (MRN [id number]) as of 10/17/2019 13:41  Ref. Range 10/17/2019 07:56 10/17/2019 11:40  Glucose-Capillary Latest Ref Range: 70 - 99 mg/dL 114 (H) 223 (H)   Results for NICKEY, CANEDO (MRN [id number]) as of 10/17/2019 13:41  Ref. Range 10/16/2019 07:58 10/16/2019 11:32 10/16/2019 16:38 10/16/2019 20:37  Glucose-Capillary Latest Ref Range: 70 - 99 mg/dL 96 204 (H) 173 (H) 107 (H)   Review of Glycemic Control  Diabetes history: DM2 Outpatient Diabetes medications: Levemir 12 units QHS, Humalog 3 units TID with meals Current orders for Inpatient glycemic control: Novolog 0-9 units TID with meals, Novolog 0-5 units QHS  Inpatient Diabetes Program Recommendations:    Insulin-meal coverage: Please consider ordering Novolog 2 units TID with meals for meal coverage (hold if patient eats less than 50% of meals, if premeal glucose is less than 80 mg/dl, or if patient is NPO).  NOTE: Noted consult for Diabetes Coordinator for hyperglycemia. Chart reviewed. Would recommend ordering Novolog 2 units TID with meals for meal coverage (hold if patient eats less than 50% of meals, if premeal glucose is less than 80 mg/dl, or if patient is NPO).  Thanks, Barnie Alderman, RN, MSN, CDE Diabetes Coordinator Inpatient Diabetes Program [phone] (Team Pager from 8am to 5pm)

## 2019-10-18 LAB — CBC
HCT: 23.9 % — ABNORMAL LOW (ref 36.0–46.0)
Hemoglobin: 7.8 g/dL — ABNORMAL LOW (ref 12.0–15.0)
MCH: 29.4 pg (ref 26.0–34.0)
MCHC: 32.6 g/dL (ref 30.0–36.0)
MCV: 90.2 fL (ref 80.0–100.0)
Platelets: 426 10*3/uL — ABNORMAL HIGH (ref 150–400)
RBC: 2.65 MIL/uL — ABNORMAL LOW (ref 3.87–5.11)
RDW: 13.9 % (ref 11.5–15.5)
WBC: 13.7 10*3/uL — ABNORMAL HIGH (ref 4.0–10.5)
nRBC: 0 % (ref 0.0–0.2)

## 2019-10-18 LAB — BASIC METABOLIC PANEL
Anion gap: 8 (ref 5–15)
BUN: 55 mg/dL — ABNORMAL HIGH (ref 8–23)
CO2: 28 mmol/L (ref 22–32)
Calcium: 8.5 mg/dL — ABNORMAL LOW (ref 8.9–10.3)
Chloride: 100 mmol/L (ref 98–111)
Creatinine, Ser: 1.59 mg/dL — ABNORMAL HIGH (ref 0.44–1.00)
GFR calc Af Amer: 32 mL/min — ABNORMAL LOW (ref >60)
GFR calc non Af Amer: 28 mL/min — ABNORMAL LOW (ref >60)
Glucose, Bld: 134 mg/dL — ABNORMAL HIGH (ref 70–99)
Potassium: 4.9 mmol/L (ref 3.5–5.1)
Sodium: 136 mmol/L (ref 135–145)

## 2019-10-18 LAB — GLUCOSE, CAPILLARY
Glucose-Capillary: 121 mg/dL — ABNORMAL HIGH (ref 70–99)
Glucose-Capillary: 121 mg/dL — ABNORMAL HIGH (ref 70–99)
Glucose-Capillary: 117 mg/dL — ABNORMAL HIGH (ref 70–99)
Glucose-Capillary: 200 mg/dL — ABNORMAL HIGH (ref 70–99)

## 2019-10-18 LAB — MAGNESIUM: Magnesium: 2.6 mg/dL — ABNORMAL HIGH (ref 1.7–2.4)

## 2019-10-18 MED ORDER — CIPROFLOXACIN HCL 250 MG PO TABS
250.0000 mg | ORAL_TABLET | Freq: Two times a day (BID) | ORAL | Status: DC
  Administered 2019-10-18 – 2019-10-19 (×3): 250 mg via ORAL
  Filled 2019-10-18 (×3): qty 1

## 2019-10-18 MED ORDER — NYSTATIN 100000 UNIT/ML MT SUSP
5.0000 mL | Freq: Four times a day (QID) | OROMUCOSAL | Status: DC
  Administered 2019-10-18 – 2019-10-25 (×17): 500000 [IU] via ORAL
  Filled 2019-10-18 (×20): qty 5

## 2019-10-18 NOTE — Progress Notes (Signed)
Physical Therapy Treatment Patient Details Name: [name] MRN: [id number] DOB: 1926-01-16 Today's Date: 10/18/2019    History of Present Illness [name] is a 86 y/o female who is s/p R AKA on 10/10/19 during same admission from having been admitted for limb threatening ischemia s/p RLE distal popliteal and R anterior tibial angioplasty, R SFA and popliteal thrombectomy, and femoral arteriotomy on 10/03/19. PMHx includes Afib, CHF, DM, HTN, HLD.     PT Comments    Pt was asleep long sitting in bed upon arriving with family members present. Pt received morphine this morning and is very lethargic throughout session. Max encouragement throughout to keep eyes open. Pt is cooperative and motivated. Did not c/o pain at rest and only minimal pain with progression from long sitting to EOB short sit. Sat EOB x ~ ten minutes. Poor sitting balance with posterior lean throughout. Required max assist to return to long sit in bed. Vitals were stable throughout. Session very limited by lethargy. Acute PT will continue efforts to progress as able per pt tolerance. Therapist answered pt's family's questions. Pt will require extensive skilled PT going forward. Recommend DC to SNF.       Follow Up Recommendations  SNF     Equipment Recommendations  None recommended by PT    Recommendations for Other Services       Precautions / Restrictions Precautions Precautions: Fall Restrictions Weight Bearing Restrictions: Yes RLE Weight Bearing: Non weight bearing    Mobility  Bed Mobility Overal bed mobility: Needs Assistance Bed Mobility: Supine to Sit;Sit to Supine     Supine to sit: Mod assist;Max assist Sit to supine: Max assist   General bed mobility comments: Pt sat on L side of EOB. Constant vcs for staying awake.  Transfers                 General transfer comment: unsafe at this time.  Ambulation/Gait                 Stairs             Wheelchair Mobility     Modified Rankin (Stroke Patients Only)       Balance Overall balance assessment: Needs assistance Sitting-balance support: Bilateral upper extremity supported (Single leg support on floor) Sitting balance-[name] Scale: Poor Sitting balance - Comments: posterior LOB in sitting. constant vcs for fwd wt shift       Standing balance comment: unsafe to trial 2/2 to pt being so lethargic                            Cognition Arousal/Alertness: Lethargic;Suspect due to medications Behavior During Therapy: [facility] for tasks assessed/performed Overall Cognitive Status: Within Functional Limits for tasks assessed                                 General Comments: Pt very lethargic but was able to open eyes and communicate wityh encouragement      Exercises      General Comments        Pertinent Vitals/Pain Pain Assessment:  (unable to rate)    Home Living                      Prior Function            PT Goals (current goals can now be found  in the care plan section) Acute Rehab PT Goals Patient Stated Goal: none stated Progress towards PT goals: Progressing toward goals    Frequency    7X/week      PT Plan Current plan remains appropriate    Co-evaluation              AM-PAC PT "6 Clicks" Mobility   Outcome Measure  Help needed turning from your back to your side while in a flat bed without using bedrails?: A Lot Help needed moving from lying on your back to sitting on the side of a flat bed without using bedrails?: A Lot Help needed moving to and from a bed to a chair (including a wheelchair)?: Total Help needed standing up from a chair using your arms (e.g., wheelchair or bedside chair)?: Total Help needed to walk in hospital room?: Total Help needed climbing 3-5 steps with a railing? : Total 6 Click Score: 8    End of Session Equipment Utilized During Treatment: Oxygen Activity Tolerance: Patient limited by  lethargy;Patient limited by fatigue Patient left: in bed;with call bell/phone within reach;with bed alarm set Nurse Communication: Mobility status PT Visit Diagnosis: Unsteadiness on feet (R26.81);Muscle weakness (generalized) (M62.81);Pain Pain - Right/Left: Right Pain - part of body: Leg     Time: 8115-7262 PT Time Calculation (min) (ACUTE ONLY): 18 min  Charges:  $Therapeutic Activity: 8-22 mins                     Julaine Fusi PTA 10/18/19, 1:12 PM

## 2019-10-18 NOTE — Progress Notes (Signed)
[place] [facility] Daily Progress Note   Subjective: Subjective: 10/10/19: Right above-the-knee amputation  Objective: Vitals:   10/17/19 1946 10/17/19 1947 10/18/19 0500 10/18/19 0638  BP: (!) 108/53   123/74  Pulse: 82 89  67  Resp: 20   18  Temp: 97.9 F (36.6 C)   98.1 F (36.7 C)  TempSrc: Oral   Oral  SpO2: (!) 66% 99%  100%  Weight:   50.8 kg   Height:        Intake/Output Summary (Last 24 hours) at 10/18/2019 1514 Last data filed at 10/18/2019 1021 Gross per 24 hour  Intake 962.17 ml  Output 1700 ml  Net -737.83 ml   Physical Exam: A&Ox3, NAD CV: RRR Pulmonary: CTA Bilaterally Abdomen: Soft, Nontender, Nondistended Vascular: Right lower extremity:Thigh soft.  Stump is healthy.  Dressing is clean and dry.   Laboratory: CBC    Component Value Date/Time   WBC 13.7 (H) 10/18/2019 0403   HGB 7.8 (L) 10/18/2019 0403   HCT 23.9 (L) 10/18/2019 0403   PLT 426 (H) 10/18/2019 0403   BMET    Component Value Date/Time   NA 136 10/18/2019 0403   NA 141 08/03/2011 0729   K 4.9 10/18/2019 0403   K 3.6 08/03/2011 0729   CL 100 10/18/2019 0403   CL 105 08/03/2011 0729   CO2 28 10/18/2019 0403   CO2 26 08/03/2011 0729   GLUCOSE 134 (H) 10/18/2019 0403   GLUCOSE 149 (H) 08/03/2011 0729   BUN 55 (H) 10/18/2019 0403   BUN 16 08/03/2011 0729   CREATININE 1.59 (H) 10/18/2019 0403   CREATININE 1.07 08/03/2011 0729   CALCIUM 8.5 (L) 10/18/2019 0403   CALCIUM 9.5 08/03/2011 0729   GFRNONAA 28 (L) 10/18/2019 0403   GFRNONAA 48 (L) 08/03/2011 0729   GFRAA 32 (L) 10/18/2019 0403   GFRAA 56 (L) 08/03/2011 0729   Assessment/Planning: The patient is a 86 year old female with chronic ischemia to the right lower extremity. Status post right above-the-knee amputation - 10/10/19  1)Patient with chronic kidney disease. Watch renal function. 2) Hemoglobin relatively stable.Continue to monitor CBC. 3)Will order PT/OT. Family is thinking about  discharge home versus SNF. 4) Patient with cloudy urine in Foley bag.    Foley discontinued.  Treating for UTI with Cipro.  5) appreciate diabetes coordinator assistance in managing blood glucose.  Discussed with Tamera Stands PA-C 10/18/2019 3:14 PM

## 2019-10-18 NOTE — Progress Notes (Addendum)
Nutrition Follow Up Note   DOCUMENTATION CODES:   Severe malnutrition in context of social or environmental circumstances  INTERVENTION:   Ensure Enlive po BID, each supplement provides 350 kcal and 20 grams of protein  Ice cream with lunch and dinner trays  MVI daily   Vitamin C 250mg po BID  Dysphagia 2 diet   NUTRITION DIAGNOSIS:   Severe Malnutrition related to social / environmental circumstances (chronic poor appetite, advanced age) as evidenced by moderate to severe fat depletions, moderate to severe muscle depletions.  GOAL:   Patient will meet greater than or equal to 90% of their needs  MONITOR:   PO intake, Supplement acceptance, Labs, Weight trends, Skin, I & O's  ASSESSMENT:   86-year-old female with h/o DM, HTN and chronic ischemia to the right lower extremity who is admitted with gangrene and ischemia now s/p R AKA 6/30   Met with pt and pt's daughter in room today. Daughter reports patient with decreased appetite and oral intake over the past few days. Pt documented to be eating anywhere from sips and bites to 100% of meals. Pt ate 100% of her breakfast this morning but only ate sips and bites of her lunch today. Pt does report that she received morphine after breakfast. Pt did drink a carton of milk and  half of a Ensure Enlive after lunch. Pt reports that her throat and mouth are feeling sore today. Daughter reports that pt will bite down on food and then spit it out. Pt does have dentures and reports that her gums are sore under her dentures. Pt would like to keep getting the Ensure in strawberry; pt prefers this cold. Pt also requesting regular ice cream vs Magic Cups as she does not like them. Will downgrade pt to a dysphagia 2 diet until her mouth pain improves. Recommend continue supplements and vitamins until healing is complete.    Per chart, pt is down 5lbs since admit; this is s/p AKA.   Medications reviewed and include: vitamin C, ciprofloxacin,  colace, insulin, synthroid, MVI, NaCl @75ml/hr   Labs reviewed: BUN 55(H), creat 1.59(H) Wbc- 13.7(H), Hgb 7.8(L), Hct 23.9(L) cbgs- 142, 110, 121 x 24 hrs  Diet Order:   Diet Order            DIET DYS 3 Room service appropriate? Yes; Fluid consistency: Thin  Diet effective now                EDUCATION NEEDS:   Education needs have been addressed  Skin:  Skin Assessment: Reviewed RN Assessment (Incision R leg)  Last BM:  7/8- type 5  Height:   Ht Readings from Last 1 Encounters:  10/10/19 5' 3" (1.6 m)    Weight:   Wt Readings from Last 1 Encounters:  10/18/19 50.8 kg    Ideal Body Weight:  52.3 kg  BMI:  Body mass index is 19.84 kg/m.  Estimated Nutritional Needs:   Kcal:  1300-1500kcal/day  Protein:  70-80g/day  Fluid:  >1.3L/day    MS, RD, LDN Please refer to AMION for RD and/or RD on-call/weekend/after hours pager 

## 2019-10-19 LAB — CBC
HCT: 26.4 % — ABNORMAL LOW (ref 36.0–46.0)
Hemoglobin: 8.5 g/dL — ABNORMAL LOW (ref 12.0–15.0)
MCH: 29.5 pg (ref 26.0–34.0)
MCHC: 32.2 g/dL (ref 30.0–36.0)
MCV: 91.7 fL (ref 80.0–100.0)
Platelets: 393 K/uL (ref 150–400)
RBC: 2.88 MIL/uL — ABNORMAL LOW (ref 3.87–5.11)
RDW: 14.2 % (ref 11.5–15.5)
WBC: 10.5 K/uL (ref 4.0–10.5)
nRBC: 0 % (ref 0.0–0.2)

## 2019-10-19 LAB — BASIC METABOLIC PANEL
Anion gap: 7 (ref 5–15)
BUN: 40 mg/dL — ABNORMAL HIGH (ref 8–23)
CO2: 26 mmol/L (ref 22–32)
Calcium: 8.6 mg/dL — ABNORMAL LOW (ref 8.9–10.3)
Chloride: 106 mmol/L (ref 98–111)
Creatinine, Ser: 1.4 mg/dL — ABNORMAL HIGH (ref 0.44–1.00)
GFR calc Af Amer: 38 mL/min — ABNORMAL LOW (ref >60)
GFR calc non Af Amer: 33 mL/min — ABNORMAL LOW (ref >60)
Glucose, Bld: 103 mg/dL — ABNORMAL HIGH (ref 70–99)
Potassium: 5.1 mmol/L (ref 3.5–5.1)
Sodium: 139 mmol/L (ref 135–145)

## 2019-10-19 LAB — URINE CULTURE: Culture: >=100000 — AB

## 2019-10-19 LAB — GLUCOSE, CAPILLARY
Glucose-Capillary: 97 mg/dL (ref 70–99)
Glucose-Capillary: 167 mg/dL — ABNORMAL HIGH (ref 70–99)
Glucose-Capillary: 148 mg/dL — ABNORMAL HIGH (ref 70–99)
Glucose-Capillary: 106 mg/dL — ABNORMAL HIGH (ref 70–99)
Glucose-Capillary: 97 mg/dL (ref 70–99)

## 2019-10-19 LAB — MAGNESIUM: Magnesium: 2.3 mg/dL (ref 1.7–2.4)

## 2019-10-19 MED ORDER — SODIUM ZIRCONIUM CYCLOSILICATE 5 G PO PACK
5.0000 g | PACK | Freq: Once | ORAL | Status: DC
  Filled 2019-10-19: qty 1

## 2019-10-19 MED ORDER — CIPROFLOXACIN HCL 500 MG PO TABS
250.0000 mg | ORAL_TABLET | ORAL | Status: DC
  Administered 2019-10-20 – 2019-10-23 (×4): 250 mg via ORAL
  Filled 2019-10-19 (×4): qty 1

## 2019-10-19 MED ORDER — ASPIRIN EC 81 MG PO TBEC
81.0000 mg | DELAYED_RELEASE_TABLET | Freq: Every day | ORAL | Status: DC
  Administered 2019-10-20 – 2019-10-25 (×6): 81 mg via ORAL
  Filled 2019-10-19 (×7): qty 1

## 2019-10-19 MED ORDER — DOCUSATE SODIUM 50 MG/5ML PO LIQD
100.0000 mg | Freq: Two times a day (BID) | ORAL | Status: DC
  Administered 2019-10-19 – 2019-10-25 (×10): 100 mg via ORAL
  Filled 2019-10-19 (×14): qty 10

## 2019-10-19 NOTE — Care Management Important Message (Signed)
Important Message  Patient Details  Name: [name] MRN: [id number] Date of Birth: 10/19/1925   Medicare Important Message Given:  Yes     [name] 10/19/2019, 3:04 PM

## 2019-10-19 NOTE — Progress Notes (Signed)
PHARMACY NOTE:  ANTIMICROBIAL RENAL DOSAGE ADJUSTMENT  Current antimicrobial regimen includes a mismatch between antimicrobial dosage and estimated renal function.  As per policy approved by the Pharmacy & Therapeutics and Medical Executive Committees, the antimicrobial dosage will be adjusted accordingly.  Current antimicrobial dosage:  Cipro 250mg  q12h po  Indication:UTI  Renal Function:  Estimated Creatinine Clearance: 21.2 mL/min (A) (by C-G formula based on SCr of 1.4 mg/dL (H)).    Antimicrobial dosage has been changed to: Cipro 250mg  q24h  Additional comments:   Thank you for allowing pharmacy to be a part of this patient's care.  Lu Duffel, PharmD, BCPS Clinical Pharmacist 10/19/2019 9:45 AM

## 2019-10-19 NOTE — Progress Notes (Signed)
Physical Therapy Treatment Patient Details Name: [name] MRN: [id number] DOB: 02-20-1926 Today's Date: 10/19/2019    History of Present Illness Ms. 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 PMHx includes Afib, CHF, DM, HTN, HLD.     PT Comments    Pt was long sitting in bed upon arriving. PT/OT co-treat 2/2 to pt complexity and inability to tolerate 2 separate sessions. Pt was alert this session and oriented x 3. She consistently is able to follow commands and is cooperative and pleasant throughout. Reports no c/o pain throughout session. She was able to exit L side of bed via rolling>to short sit with mod-max assist. Sat EOB x several minutes prior to performing stand pivot max assist of one to [facility] then BSC to recliner. Overall pt tolerated session well and is progressing towards PT goals. PT left room prior to OT. Pt will need continued skilled PT at DC to address deficits and assist to returning to PLOF. Therapist recommends DC to SNF to address these deficits. If pt is unwilling to go to SNF,wants to DC home against recommendations, will require 24 hour assistance.    Follow Up Recommendations  SNF     Equipment Recommendations  None recommended by PT    Recommendations for Other Services       Precautions / Restrictions Precautions Precautions: Fall Restrictions Weight Bearing Restrictions: Yes RLE Weight Bearing: Non weight bearing    Mobility  Bed Mobility Overal bed mobility: Needs Assistance Bed Mobility: Rolling;Supine to Sit Rolling: Mod assist   Supine to sit: Mod assist;Max assist;HOB elevated (HOB only slightly elevated)     General bed mobility comments: Pt was able to roll L to short sit with increased time and cues.   Transfers Overall  transfer level: Needs assistance Equipment used: None Transfers: Sit to/from [facility] Sit to Stand: Max assist Stand pivot transfers: Max assist       General transfer comment: Pt was able to STS/stand pivot 3 x during session. Did not use AD but pt had BUE support on therapist. vcs for technique and sequencing. able to static stand ~ 30-1 minute ehilr hygene performed after BM. she stood pivot to [facility] then just stood, then stood pivot to recliner  Ambulation/Gait             General Gait Details: unsafe to advance to at this time.   Stairs             Wheelchair Mobility    Modified Rankin (Stroke Patients Only)       Balance Overall balance assessment: Needs assistance Sitting-balance support: Bilateral upper extremity supported Sitting balance-[name] Scale: Fair Sitting balance - Comments: CGA to close SBA throughout for sitting balance   Standing balance support: Bilateral upper extremity supported;During functional activity Standing balance-[name] Scale: Poor Standing balance comment: max assist to maintain standing balance                            Cognition Arousal/Alertness: Awake/alert Behavior During Therapy: WFL for tasks assessed/performed Overall Cognitive Status: Within Functional Limits for tasks assessed  General Comments: Pt awake/alert, conversational. Able to follow VCs consistently.      Exercises      General Comments        Pertinent Vitals/Pain Pain Assessment: No/denies pain Pain Score: 0-No pain Faces Pain Scale: No hurt Pain Location: RLE Pain Intervention(s): Limited activity within patient's tolerance;Monitored during session;Premedicated before session    Home Living                      Prior Function            PT Goals (current goals can now be found in the care plan section) Acute Rehab PT Goals Patient Stated Goal: To get  stronger and go home so she can care for her rose garden. Progress towards PT goals: Progressing toward goals    Frequency    7X/week      PT Plan Current plan remains appropriate    Co-evaluation PT/OT/SLP Co-Evaluation/Treatment: Yes Reason for Co-Treatment: Complexity of the patient's impairments (multi-system involvement);Necessary to address cognition/behavior during functional activity;For patient/therapist safety;To address functional/ADL transfers PT goals addressed during session: Mobility/safety with mobility;Balance;Proper use of DME;Strengthening/ROM        AM-PAC PT "6 Clicks" Mobility   Outcome Measure  Help needed turning from your back to your side while in a flat bed without using bedrails?: A Lot Help needed moving from lying on your back to sitting on the side of a flat bed without using bedrails?: A Lot Help needed moving to and from a bed to a chair (including a wheelchair)?: A Lot Help needed standing up from a chair using your arms (e.g., wheelchair or bedside chair)?: A Lot Help needed to walk in hospital room?: Total Help needed climbing 3-5 steps with a railing? : Total 6 Click Score: 10    End of Session Equipment Utilized During Treatment: Oxygen;Gait belt Activity Tolerance: Patient tolerated treatment well;Patient limited by fatigue Patient left: in chair;with call bell/phone within reach;with chair alarm set Nurse Communication: Mobility status PT Visit Diagnosis: Unsteadiness on feet (R26.81);Muscle weakness (generalized) (M62.81);Pain Pain - Right/Left: Right Pain - part of body: Leg     Time: 1520-1550 PT Time Calculation (min) (ACUTE ONLY): 30 min  Charges:  $Therapeutic Activity: 8-22 mins                     Julaine Fusi PTA 10/19/19, 4:17 PM

## 2019-10-19 NOTE — Progress Notes (Signed)
[place] [facility] Daily Progress Note  Subjective: Patient did work with physical and Occupational Therapy. Seems to be doing a little bit better today, slightly more alert.  Objective: Vitals:   10/18/19 2122 10/19/19 0500 10/19/19 0547 10/19/19 0834  BP: (!) 125/57  (!) 152/69 (!) 143/73  Pulse: 70  79 78  Resp: 20  20   Temp: 98.3 F (36.8 C)  98.2 F (36.8 C)   TempSrc: Oral  Oral   SpO2: 100%  92%   Weight:  53.3 kg    Height:        Intake/Output Summary (Last 24 hours) at 10/19/2019 1355 Last data filed at 10/19/2019 0540 Gross per 24 hour  Intake 1987.95 ml  Output 150 ml  Net 1837.95 ml   Physical Exam: A&Ox3, NAD CV: RRR Pulmonary: CTA Bilaterally Abdomen: Soft, Nontender, Nondistended Vascular: Right lower extremity:Thigh soft. Stump is healthy. Dressing is clean and dry.   Laboratory: CBC    Component Value Date/Time   WBC 10.5 10/19/2019 0452   HGB 8.5 (L) 10/19/2019 0452   HCT 26.4 (L) 10/19/2019 0452   PLT 393 10/19/2019 0452   BMET    Component Value Date/Time   NA 139 10/19/2019 0452   NA 141 08/03/2011 0729   K 5.1 10/19/2019 0452   K 3.6 08/03/2011 0729   CL 106 10/19/2019 0452   CL 105 08/03/2011 0729   CO2 26 10/19/2019 0452   CO2 26 08/03/2011 0729   GLUCOSE 103 (H) 10/19/2019 0452   GLUCOSE 149 (H) 08/03/2011 0729   BUN 40 (H) 10/19/2019 0452   BUN 16 08/03/2011 0729   CREATININE 1.40 (H) 10/19/2019 0452   CREATININE 1.07 08/03/2011 0729   CALCIUM 8.6 (L) 10/19/2019 0452   CALCIUM 9.5 08/03/2011 0729   GFRNONAA 33 (L) 10/19/2019 0452   GFRNONAA 48 (L) 08/03/2011 0729   GFRAA 38 (L) 10/19/2019 0452   GFRAA 56 (L) 08/03/2011 0729   Assessment/Planning: The patient is a 86 year old female with chronic ischemia to the right lower extremity. Status post right above-the-knee amputation -10/10/19  1)Patient with chronic kidney disease. At this point,  I have IV fluids running daily as well as giving  patient Lokelma (daily) to keep the patient's renal function stable as well as prevent hyperkalemia. Without both, I find the patient's creatinine increases as well this hyperkalemia. 2) Hemoglobin relatively stable. 3)white blood cell count now normal since Foley was removed and treatment for UTI started yesterday. 4) patient continues to refuse to eat. Failure to thrive. Would consider Megace however will await palliative recommendations 5) started nystatin swish and swallow for oral discomfort the patient is complaining about. Physical exam does not show any oral thrush. 6) at this point, the patient seems to be comfortable with moving towards hospice. Son who was present in the room today seems to feel the same. Will consult palliative in follow-up on the recommendations.  Seen and examined with Dr. Eber Hong Sheva Mcdougle PA-C 10/19/2019 1:55 PM

## 2019-10-19 NOTE — Progress Notes (Signed)
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 PMHx includes Afib, CHF, DM, HTN, HLD.    OT comments  [name] was seen for OT/PT co-treatment on this date. Upon arrival to room pt awake/alert, semi-supine in bed. Pt agreeable to treatment session, and denies pain. She remains generally conversational, and oriented to self and limited situation. Pt endorses desire to go home to take care of her rose bushes, but states "I have to take care of this rose first". [name] as described below. She generally requires +1 MAX A for STS tasks, however needs +2 assist for management of peri-care in standing after using the [facility]. Pt educated on desensitization techniques for residual limb management and return verbalizes understanding, however long-term carryover may be limited by cognition.  Pt making good progress toward goals  and continues to benefit from skilled OT services to maximize return to PLOF and minimize risk of future falls, injury, caregiver burden, and readmission. Will continue to follow POC as written. Discharge & frequency recommendation remains appropriate.    Follow Up Recommendations  SNF    Equipment Recommendations  3 in 1 bedside commode    Recommendations for Other Services      Precautions / Restrictions Precautions Precautions: Fall Restrictions Weight Bearing Restrictions: Yes RLE Weight Bearing: Non weight bearing       Mobility Bed Mobility Overal bed mobility: Needs Assistance Bed Mobility: Supine to Sit     Supine to sit: Mod assist;Max assist        Transfers Overall  transfer level: Needs assistance Equipment used: Rolling walker (2 wheeled);1 person hand held assist Transfers: Sit to/from Stand;Squat Pivot Transfers;Stand Pivot Transfers Sit to Stand: Max assist Stand pivot transfers: Max assist Squat pivot transfers: Max assist     General transfer comment: Pt performs multiple stand/squat pivot transfers during session. Consistently requires MAX assist, but has good participation and follows directions for hand/foot placement consistently.    Balance Overall balance assessment: Needs assistance Sitting-balance support: Bilateral upper extremity supported (single leg support on floor) Sitting balance-[name] Scale: Fair Sitting balance - Comments: Min A to CGA to maintain sitting balance at EOB during functional tasks.   Standing balance support: Bilateral upper extremity supported;During functional activity Standing balance-[name] Scale: Poor Standing balance comment: Unable to maintain standing balance w/o assist.                           ADL either performed or assessed with clinical judgement   ADL Overall ADL's : Needs assistance/impaired Eating/Feeding: Set up;Sitting Eating/Feeding Details (indicate cue type and reason): Set-up assist for small tray items and containers. Grooming: Set up;Wash/dry face;Brushing hair;Minimal assistance;Min guard Grooming Details (indicate cue type and reason): Occasional +1 assist to maintain seated balance             Lower Body Dressing: Sitting/lateral leans;Moderate assistance Lower Body Dressing Details (indicate cue type and reason): MOD A to don hospital sock on LLE. Pt limited by decreased sitting balance 2/2 recent AKA. Toilet Transfer: Musician Details (indicate cue type and reason): 1 Person squat  pivot to [facility] MAX A Toileting- Clothing Manipulation and Hygiene: Maximal assistance;+2 for physical assistance Toileting - Clothing Manipulation  Details (indicate cue type and reason): MAX A for peri-care. +2 for STS support.             Vision Baseline Vision/History: Wears glasses Wears Glasses: At all times Patient Visual Report: No change from baseline     Perception     Praxis      Cognition Arousal/Alertness: Awake/alert Behavior During Therapy: WFL for tasks assessed/performed Overall Cognitive Status: Within Functional Limits for tasks assessed                                 General Comments: Pt awake/alert, conversational. Able to follow VCs consistently.        Exercises Other Exercises Other Exercises: OT facilitates seated grooming tasks, toilet transfer, toileting, and self-feeding tasks this date. See ADL section above for additional detail regarding occupational performance.   Shoulder Instructions       General Comments Pt vitals generally stable with pt on 1L [place]. After functional tasks pt noted to desat to mid 80's however difficult to obtain consistent reading. Pt increased to 2L [place] as she voices SOB and lightheadedness. Sats improve to 90%. Pt also noted with bright red bleeding from rectum after peri-care. RN notified/aware.    Pertinent Vitals/ Pain       Pain Assessment: No/denies pain Pain Intervention(s): Monitored during session;Repositioned  Home Living Family/patient expects to be discharged to:: Private residence Living Arrangements: Children Available Help at Discharge: Family;Available 24 hours/day Type of Home: House Home Access: Ramped entrance     Home Layout: One level     Bathroom Shower/Tub: Walk-in Hydrologist: Standard Bathroom Accessibility: Yes How Accessible: Accessible via wheelchair;Accessible via walker Home Equipment: [place] - 2 wheels;Walker - 4 wheels;Cane - single point;Grab bars - tub/shower;Grab bars - toilet;Bedside commode;Shower seat;Wheelchair - manual;Toilet riser;Wheelchair - power          Prior  Functioning/Environment              Frequency  Min 2X/week        Progress Toward Goals  OT Goals(current goals can now be found in the care plan section)  Progress towards OT goals: Progressing toward goals  Acute Rehab OT Goals Patient Stated Goal: To get stronger and go home so she can care for her rose garden. OT Goal Formulation: With patient Time For Goal Achievement: 10/26/19 Potential to Achieve Goals: Good  Plan Discharge plan remains appropriate;Frequency remains appropriate    Co-evaluation                 AM-PAC OT "6 Clicks" Daily Activity     Outcome Measure   Help from another person eating meals?: A Little Help from another person taking care of personal grooming?: A Little Help from another person toileting, which includes using toliet, bedpan, or urinal?: A Lot Help from another person bathing (including washing, rinsing, drying)?: A Lot Help from another person to put on and taking off regular upper body clothing?: A Little Help from another person to put on and taking off regular lower body clothing?: A Lot 6 Click Score: 15    End of Session Equipment Utilized During Treatment: Gait belt;Rolling walker  OT Visit Diagnosis: Other abnormalities of gait and mobility (R26.89);Muscle weakness (generalized) (M62.81);Pain Pain - Right/Left: Right Pain - part of body:  Ankle and joints of foot   Activity Tolerance Patient limited by fatigue   Patient Left in bed;with call bell/phone within reach;with bed alarm set   Nurse Communication Mobility status;Other (comment) (pt noted with bleeding, vitals during session.)        Time: 4599-7741 OT Time Calculation (min): 41 min  Charges: OT General Charges $OT Visit: 1 Visit OT Treatments $Self Care/Home Management : 23-37 mins  Shara Blazing, M.S., OTR/L Ascom: [phone] 10/19/19, 11:18 AM

## 2019-10-19 NOTE — Progress Notes (Signed)
RN attempted to give pt her medications. Pt stated she did not want them and went back to sleeping. Son was in the room and did not want the RN to bother the pt if the pt was uninterested. Hezzie Bump notified. Pt already consulted with palliative. Having chaplain to see her until palliative can.

## 2019-10-20 LAB — BASIC METABOLIC PANEL
Anion gap: 7 (ref 5–15)
BUN: 34 mg/dL — ABNORMAL HIGH (ref 8–23)
CO2: 26 mmol/L (ref 22–32)
Calcium: 8.6 mg/dL — ABNORMAL LOW (ref 8.9–10.3)
Chloride: 108 mmol/L (ref 98–111)
Creatinine, Ser: 1.24 mg/dL — ABNORMAL HIGH (ref 0.44–1.00)
GFR calc Af Amer: 44 mL/min — ABNORMAL LOW (ref >60)
GFR calc non Af Amer: 38 mL/min — ABNORMAL LOW (ref >60)
Glucose, Bld: 105 mg/dL — ABNORMAL HIGH (ref 70–99)
Potassium: 4.7 mmol/L (ref 3.5–5.1)
Sodium: 141 mmol/L (ref 135–145)

## 2019-10-20 LAB — CBC
HCT: 24 % — ABNORMAL LOW (ref 36.0–46.0)
Hemoglobin: 7.3 g/dL — ABNORMAL LOW (ref 12.0–15.0)
MCH: 28.7 pg (ref 26.0–34.0)
MCHC: 30.4 g/dL (ref 30.0–36.0)
MCV: 94.5 fL (ref 80.0–100.0)
Platelets: 412 10*3/uL — ABNORMAL HIGH (ref 150–400)
RBC: 2.54 MIL/uL — ABNORMAL LOW (ref 3.87–5.11)
RDW: 14.2 % (ref 11.5–15.5)
WBC: 8.5 10*3/uL (ref 4.0–10.5)
nRBC: 0 % (ref 0.0–0.2)

## 2019-10-20 LAB — GLUCOSE, CAPILLARY
Glucose-Capillary: 87 mg/dL (ref 70–99)
Glucose-Capillary: 179 mg/dL — ABNORMAL HIGH (ref 70–99)
Glucose-Capillary: 113 mg/dL — ABNORMAL HIGH (ref 70–99)
Glucose-Capillary: 179 mg/dL — ABNORMAL HIGH (ref 70–99)

## 2019-10-20 LAB — MAGNESIUM: Magnesium: 2.2 mg/dL (ref 1.7–2.4)

## 2019-10-20 NOTE — TOC Progression Note (Signed)
Transition of Care [facility]) - Progression Note    Patient Details  Name: Anais Denslow Motz MRN: [id number] Date of Birth: 11-28-1925  [facility] CM/SW Contact  Zigmund Daniel Dorian Pod, RN Phone Number: 10/20/2019, 4:55 PM  Clinical Narrative:    RN spoke with POA Jacques Navy) who requested another discussion with PT based upon the re: for SNF. State the family may want to take the pt home with HH-PT. Explained LOC with SNF vs [facility] services for ongoing PT services. Aware pt can be placement at a SNF and d/c with HH-PT when stable. Although family feels pt is mentally stable and ready on what she wants but may not be physically prepared for SNF. Reached out to [place] (PT) and requested another discussion on this topic for pt/family comfort level.  Expected Discharge Plan: [place] Barriers to Discharge: Continued Medical Work up  Expected Discharge Plan and Services Expected Discharge Plan: [place]   Discharge Planning Services: CM Consult   Living arrangements for the past 2 months: Single Family Home                                       Social Determinants of Health (SDOH) Interventions    Readmission Risk Interventions No flowsheet data found.

## 2019-10-20 NOTE — Progress Notes (Signed)
   Chaplain On-Call responded to Order Requisition. The OR stated that the patient has a recent above-knee amputation; is Full Code status; would appreciate assistance with code wishes. Chaplain spoke with RN Minette Brine and received update about the patient's condition.  Chaplain met patient and her daughter [name] at bedside. Chaplain stated the reason for his visit is to provide information about Advance Directives for patient. Daughter stated that there is a DNR order in patient's medical record.  Chaplain provided spiritual and emotional support for daughter and patient, who are grieving the death of patient's husband last December.  Chaplain reported the daughter's DNR statement to RN Minette Brine, who will research this with the Physician for clarification about patient's code status.  Kemper Darnelle Catalan., [facility]

## 2019-10-20 NOTE — Progress Notes (Signed)
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 PMHx includes Afib, CHF, DM, HTN, HLD.     PT Comments    Pt was long sitting in bed with daughter at bedside upon arriving. Pt is alert, conversational, and able to follow commands throughout. She states," I just want to go home." several times during session. Pt very eager to get OOB and requested to use BSC to have BM. She required mod assist + increased time to achieve EOB short sit. vcs and tactile cues throughout. Pt's vitals stable  on 2L [place].Sat EOB x several minutes prior to progressing to transfers. She requires max assist of one to stand and pivot with 2nd person close for safety. Pt has BM on [facility] and required +2 assist to clean/hygene with therapist assisting with standing while RN tech assisted with wiping. [name]. All stand pivots towards pt's L for safety. Max assist + gait belt for all transfers with slight L knee buckling. Pt is progressing but slowly. Therapist discussed with family members/patient that palliative care consult is placed. Pt does state she wants to "keep doing what I have to do to get better, but I want to go home." Therapist did discussed with family member that she is requiring a lot of assistance at this time and requires a lot of assistance to safely perform routine mobility. Discussed recommendation for SNF at [place] however pt/family state they are leaning towards home. Acute PT will continue to follow and progress per POC. Pt was seated in recliner with chair alarm in placed, call bell in reach, and RN tech in room with family  at bedside.      Follow Up Recommendations  SNF     Equipment Recommendations  None recommended by PT    Recommendations for Other Services       Precautions / Restrictions Precautions Precautions: Fall Precaution Comments: O2, HR,RR Restrictions Weight Bearing Restrictions: Yes RLE Weight Bearing: Non weight bearing    Mobility  Bed Mobility Overal bed mobility: Needs Assistance Bed Mobility: Rolling;Supine to Sit Rolling: Mod assist   Supine to sit: Mod assist     General bed mobility comments: MOd assist toachieve EOB short sit with increased time and constant vcs for sequencing and technique. sat EOB x several minutes prior to transfers. no symptoms of dizziness/fatigue  Transfers Overall transfer level: Needs assistance Equipment used: None (Therapist holding gait belt under BUEs. ) Transfers: Sit to/from [facility] Sit to Stand: Max assist Stand pivot transfers: Max assist       General transfer comment: Pt performed STS 3 x throughout session with pivot onto BSCthen pivot to recliner. Max assist + vcs for technique. pt does have slight LLE knee buckling. therapist blocked knee during transfers to prevent buckling.  Ambulation/Gait             General Gait Details: unsafe to advance to at this time.   Stairs             Wheelchair Mobility    Modified Rankin (Stroke Patients Only)       Balance Overall balance assessment:  Needs assistance Sitting-balance support: Bilateral upper extremity supported Sitting balance-[name] Scale: Fair Sitting balance - Comments: CGA at first progressing to close SBA. pt able to maintain sitting balance EOB x ~ 3-4 minutes oprior to transfers   Standing balance support: Bilateral upper extremity supported;During functional activity Standing balance-[name] Scale: Poor                              Cognition Arousal/Alertness: Awake/alert Behavior During Therapy: WFL for tasks  assessed/performed Overall Cognitive Status: Within Functional Limits for tasks assessed                                 General Comments: Pt awake/alert, conversational. Able to follow VCs consistently.      Exercises      General Comments        Pertinent Vitals/Pain Pain Assessment: No/denies pain Pain Score: 0-No pain Faces Pain Scale: No hurt Pain Intervention(s): Limited activity within patient's tolerance;Monitored during session    Home Living                      Prior Function            PT Goals (current goals can now be found in the care plan section) Acute Rehab PT Goals Patient Stated Goal: go home Progress towards PT goals: Progressing toward goals    Frequency    7X/week      PT Plan Current plan remains appropriate    Co-evaluation              AM-PAC PT "6 Clicks" Mobility   Outcome Measure  Help needed turning from your back to your side while in a flat bed without using bedrails?: A Lot Help needed moving from lying on your back to sitting on the side of a flat bed without using bedrails?: A Lot Help needed moving to and from a bed to a chair (including a wheelchair)?: A Lot Help needed standing up from a chair using your arms (e.g., wheelchair or bedside chair)?: A Lot Help needed to walk in hospital room?: Total Help needed climbing 3-5 steps with a railing? : Total 6 Click Score: 10    End of Session Equipment Utilized During Treatment: Oxygen;Gait belt Activity Tolerance: Patient tolerated treatment well;Patient limited by fatigue Patient left: in chair;with call bell/phone within reach;with chair alarm set Nurse Communication: Mobility status PT Visit Diagnosis: Unsteadiness on feet (R26.81);Muscle weakness (generalized) (M62.81);Pain Pain - Right/Left: Right Pain - part of body: Leg     Time: 0910-0940 PT Time Calculation (min) (ACUTE ONLY): 30 min  Charges:  $Therapeutic Activity: 23-37  mins                     [name] PTA 10/20/19, 11:27 AM

## 2019-10-20 NOTE — Progress Notes (Signed)
Chaplain spoke to RN after seeing pt about advance directives. Daughter told chaplain that pt is a DNR and that hospital should have it on file. RN verified with daughter and son and both reported that they had given the paperwork in April and that her mom had been a DNR for years. RN checked in the electronic chart and couldn't find any record of the DNR. RN contacted on-call Dr. Teola Bradley. Dr. Teola Bradley asked RN to let him know as soon as the paperwork was brought in so he could change the code status.

## 2019-10-21 LAB — BASIC METABOLIC PANEL
Anion gap: 6 (ref 5–15)
BUN: 29 mg/dL — ABNORMAL HIGH (ref 8–23)
CO2: 24 mmol/L (ref 22–32)
Calcium: 8.6 mg/dL — ABNORMAL LOW (ref 8.9–10.3)
Chloride: 109 mmol/L (ref 98–111)
Creatinine, Ser: 1.19 mg/dL — ABNORMAL HIGH (ref 0.44–1.00)
GFR calc Af Amer: 46 mL/min — ABNORMAL LOW (ref >60)
GFR calc non Af Amer: 40 mL/min — ABNORMAL LOW (ref >60)
Glucose, Bld: 116 mg/dL — ABNORMAL HIGH (ref 70–99)
Potassium: 5.1 mmol/L (ref 3.5–5.1)
Sodium: 139 mmol/L (ref 135–145)

## 2019-10-21 LAB — GLUCOSE, CAPILLARY
Glucose-Capillary: 108 mg/dL — ABNORMAL HIGH (ref 70–99)
Glucose-Capillary: 152 mg/dL — ABNORMAL HIGH (ref 70–99)
Glucose-Capillary: 80 mg/dL (ref 70–99)
Glucose-Capillary: 140 mg/dL — ABNORMAL HIGH (ref 70–99)

## 2019-10-21 LAB — CBC
HCT: 24.3 % — ABNORMAL LOW (ref 36.0–46.0)
Hemoglobin: 7.3 g/dL — ABNORMAL LOW (ref 12.0–15.0)
MCH: 28.5 pg (ref 26.0–34.0)
MCHC: 30 g/dL (ref 30.0–36.0)
MCV: 94.9 fL (ref 80.0–100.0)
Platelets: 381 10*3/uL (ref 150–400)
RBC: 2.56 MIL/uL — ABNORMAL LOW (ref 3.87–5.11)
RDW: 14.3 % (ref 11.5–15.5)
WBC: 8.8 10*3/uL (ref 4.0–10.5)
nRBC: 0 % (ref 0.0–0.2)

## 2019-10-21 LAB — MAGNESIUM: Magnesium: 2 mg/dL (ref 1.7–2.4)

## 2019-10-21 NOTE — Progress Notes (Addendum)
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 PMHx includes Afib, CHF, DM, HTN, HLD.     PT Comments    Pt reported poor sleep last night with general anxiety throughout the night.  Stated nursing was aware and addressed.  She agrees to ex but struggles to complete taking rest after every few reps.   Declined our of bed at this time.  Daughter in at end of session.  Discussed needs for discharge as she continues to plan to take pt home.  Discussed possible need for mechanical lift at home for caregiver and pt safety.  She did not seem to think they would need one stating she is strong and felt if given another week she would be able to move more on her own.  She expressed her desire for her mom to get up today and with her encouragement she agrees after she rests a while.  Returned x 2 during the am and daughter asks for her mom to be allowed to rest longer each time stating she has had a rough morning.  Pt remains SNF appropriate but daughter continues to want to take pt home.  She will need 24 hour care with +2 assist for transfers.   A mechanical lift would be appropriate but family seems resistant at this time.  Education given regarding lift and general safety for family and pt.  Daughter seems somewhat flustered and has high expectations for pt's abilities and recovery.  Updated frequency to 2 x per week per discussion with lead PT on site.  Will continue to see daily as schedule allows per family request.   Follow Up Recommendations  SNF     Equipment Recommendations  None recommended by PT    Recommendations for Other  Services       Precautions / Restrictions Precautions Precautions: Fall Precaution Comments: O2, HR,RR Restrictions Weight Bearing Restrictions: Yes RLE Weight Bearing: Non weight bearing Other Position/Activity Restrictions: Per vascular surgeon    Mobility  Bed Mobility                  Transfers                    Ambulation/Gait                 Stairs             Wheelchair Mobility    Modified Rankin (Stroke Patients Only)       Balance                                            Cognition Arousal/Alertness: Awake/alert Behavior During Therapy: WFL for tasks assessed/performed Overall Cognitive Status: Within Functional Limits for tasks assessed                                 General Comments: Pt awake/alert, conversational. Able to follow VCs consistently.      Exercises Other Exercises Other  Exercises: BLE AAROM ankle pumps, LAQ, SAQ, ab/add SLR as available x 10    General Comments        Pertinent Vitals/Pain Pain Assessment: No/denies pain    Home Living                      Prior Function            PT Goals (current goals can now be found in the care plan section) Progress towards PT goals: Progressing toward goals    Frequency    Min 2X/week      PT Plan Frequency needs to be updated    Co-evaluation              AM-PAC PT "6 Clicks" Mobility   Outcome Measure  Help needed turning from your back to your side while in a flat bed without using bedrails?: A Lot Help needed moving from lying on your back to sitting on the side of a flat bed without using bedrails?: A Lot Help needed moving to and from a bed to a chair (including a wheelchair)?: A Lot Help needed standing up from a chair using your arms (e.g., wheelchair or bedside chair)?: A Lot Help needed to walk in hospital room?: Total Help needed climbing 3-5 steps with a railing? : Total 6  Click Score: 10    End of Session Equipment Utilized During Treatment: Oxygen Activity Tolerance: Patient limited by fatigue Patient left: in bed;with call bell/phone within reach;with family/visitor present;with bed alarm set Nurse Communication: Mobility status PT Visit Diagnosis: Unsteadiness on feet (R26.81);Muscle weakness (generalized) (M62.81);Pain Pain - Right/Left: Right Pain - part of body: Leg     Time: 0902-0917 PT Time Calculation (min) (ACUTE ONLY): 15 min  Charges:  $Therapeutic Exercise: 8-22 mins                    Chesley Noon, PTA 10/21/19, 1:44 PM

## 2019-10-22 DIAGNOSIS — Z789 Other specified health status: Secondary | ICD-10-CM

## 2019-10-22 DIAGNOSIS — I7025 Atherosclerosis of native arteries of other extremities with ulceration: Secondary | ICD-10-CM

## 2019-10-22 DIAGNOSIS — R531 Weakness: Secondary | ICD-10-CM

## 2019-10-22 DIAGNOSIS — Z66 Do not resuscitate: Secondary | ICD-10-CM

## 2019-10-22 DIAGNOSIS — Z7189 Other specified counseling: Secondary | ICD-10-CM

## 2019-10-22 DIAGNOSIS — Z515 Encounter for palliative care: Secondary | ICD-10-CM

## 2019-10-22 LAB — CBC
HCT: 24.7 % — ABNORMAL LOW (ref 36.0–46.0)
Hemoglobin: 7.7 g/dL — ABNORMAL LOW (ref 12.0–15.0)
MCH: 28.9 pg (ref 26.0–34.0)
MCHC: 31.2 g/dL (ref 30.0–36.0)
MCV: 92.9 fL (ref 80.0–100.0)
Platelets: 392 10*3/uL (ref 150–400)
RBC: 2.66 MIL/uL — ABNORMAL LOW (ref 3.87–5.11)
RDW: 14.3 % (ref 11.5–15.5)
WBC: 10.6 10*3/uL — ABNORMAL HIGH (ref 4.0–10.5)
nRBC: 0 % (ref 0.0–0.2)

## 2019-10-22 LAB — BASIC METABOLIC PANEL
Anion gap: 4 — ABNORMAL LOW (ref 5–15)
BUN: 25 mg/dL — ABNORMAL HIGH (ref 8–23)
CO2: 29 mmol/L (ref 22–32)
Calcium: 9 mg/dL (ref 8.9–10.3)
Chloride: 109 mmol/L (ref 98–111)
Creatinine, Ser: 1.34 mg/dL — ABNORMAL HIGH (ref 0.44–1.00)
GFR calc Af Amer: 40 mL/min — ABNORMAL LOW (ref >60)
GFR calc non Af Amer: 34 mL/min — ABNORMAL LOW (ref >60)
Glucose, Bld: 119 mg/dL — ABNORMAL HIGH (ref 70–99)
Potassium: 5.5 mmol/L — ABNORMAL HIGH (ref 3.5–5.1)
Sodium: 142 mmol/L (ref 135–145)

## 2019-10-22 LAB — GLUCOSE, CAPILLARY
Glucose-Capillary: 102 mg/dL — ABNORMAL HIGH (ref 70–99)
Glucose-Capillary: 170 mg/dL — ABNORMAL HIGH (ref 70–99)
Glucose-Capillary: 181 mg/dL — ABNORMAL HIGH (ref 70–99)
Glucose-Capillary: 133 mg/dL — ABNORMAL HIGH (ref 70–99)

## 2019-10-22 LAB — MAGNESIUM: Magnesium: 2 mg/dL (ref 1.7–2.4)

## 2019-10-22 MED ORDER — SODIUM ZIRCONIUM CYCLOSILICATE 5 G PO PACK
5.0000 g | PACK | Freq: Once | ORAL | Status: AC
  Administered 2019-10-22: 5 g via ORAL
  Filled 2019-10-22: qty 1

## 2019-10-22 MED ORDER — MEGESTROL ACETATE 400 MG/10ML PO SUSP
400.0000 mg | Freq: Every day | ORAL | Status: DC
  Administered 2019-10-22 – 2019-10-25 (×4): 400 mg via ORAL
  Filled 2019-10-22 (×4): qty 10

## 2019-10-22 MED ORDER — SODIUM CHLORIDE 0.9 % IV SOLN: INTRAVENOUS | Status: DC

## 2019-10-22 NOTE — Care Management Important Message (Signed)
Important Message  Patient Details  Name: [name] MRN: [id number] Date of Birth: March 20, 1926   Medicare Important Message Given:  Yes     [name] 10/22/2019, 11:49 AM

## 2019-10-22 NOTE — Consult Note (Signed)
Consultation Note Date: 10/22/2019   Patient Name: [name]  DOB: 09-13-1925  MRN: [id number]  Age / Sex: 86 y.o., female  PCP: Sofie Hartigan, MD Referring Physician: Katha Cabal, MD  Reason for Consultation: Disposition and Establishing goals of care  HPI/Patient Profile: 86 y.o. female  with past medical history of hypertension, diabetes mellitus, CHF, a.fib admitted on 10/03/2019 with right lower extremity gangrene.   Patient is status post right above-the-knee amputation - 10/10/2019.  Patient and family face treatment option decisions, advanced directive decisions, and anticipatory care needs.   Clinical Assessment and Goals of Care: I have reviewed medical records including EPIC notes, labs and imaging, received report from primary RN. RN has no acute concerns.  Went to visit patient at bedside and assessed the patient. She was awake, alert, and able to participate in conversation. She denied any pain. She showed no gestures or non-verbal signs of discomfort. Daughter/[name] and son/[name] were at bedside.   Met with patient, [name] and [name]  to discuss diagnosis, prognosis, GOC, EOL wishes, disposition, and options.  I introduced Palliative Medicine as specialized medical care for people living with serious illness. It focuses on providing relief from the symptoms and stress of a serious illness. The goal is to improve quality of life for both the patient and the family.   We discussed a brief life review of the patient. Ms. [name] described herself as someone who loves to read, sew, and complete puzzles. She was married, but unfortunately her husband passed away in 2020/04/16. She has 3 daughters and 1 son who are all living and active in her care.  As far as functional and nutritional status, before this hospitalization, Ms. [name] lived at home. Her 4 children rotated caring for her and were able to offer her almost 24/7  support at her home - stating that the patient was alone for only 25-30 minutes at a time occasionally. The patient was able to ambulate at home with a walker to the bathroom and perform ADL's independently. Her family described her as a "light eater" at home.  We discussed patient's current illness and what it means in the larger context of patient's on-going co-morbidities. Natural disease trajectory and expectations at EOL were discussed.  I attempted to elicit values and goals of care important to the patient. The patient wants to go home as well as try and regain some of her strength so she can "get around and go to the bathroom" by herself. The family stated that know their mother wants to go home. The family stated they took care of their father at home last year during his decline and they are familiar with caring for a loved one at home. The family expressed that since their father/Ms. [name]'s husband passed in 2023/04/17, she has seemed depressed - they feel as if she will feel better and be more motivated at home with family compared to a facility. Discussion and education was provided to Ms. [name] and her family about SNF rehab vs HHPT. After discussion and with consideration of Ms. [name] age, co-morbidities, and goals, the patient and family decided they did want Ms. [name] to discharge with HHPT. The family states they have  a bedside commode, walker, and wheelchair for use once she is home and they can provide 24/7 care for her between the 4 children. The daughter states they do not feel they need a mechanical lift since the patient is able to transfer to a wheelchair for any transportation needs and is able to pivot to the [facility]. Recommended outpatient Palliative to follow when patient is discharged and family was agreeable.   Advance directives, concepts specific to code status, artificial feeding and hydration, and rehospitalization were considered and discussed. Daughter/[name] states she is the  patient's HCPOA, but no paperwork is in the shadow chart or medical record - she was asked to provide copies if able. A Living Will was present on the shadow chart. Patient was previously documented as a DNR in the EMR but did not have DNR paperwork - reviewed DNR status with patient and family and DNR form was completed. MOST form was provided, reviewed, and discussed - form was completed: DNR, Limited Additional Interventions, Determine use or limitation of antibiotics, IV fluids for a defined trail period, No feeding tube. Completed DNR and MOST form were entered into Media tab of EMR and hard copies were placed in the shadow chart.   Discussed with patient/family the importance of continued conversation with family and the medical providers regarding overall plan of care and treatment options, ensuring decisions are within the context of the patients values and GOCs.    Questions and concerns were addressed. The family was encouraged to call with questions or concerns. PMT card was provided.   Primary Decision Maker HCPOA  [name] - daughter [phone]  **[name] states it is ok to call her brother [name] (listed in the patient's chart as contact) because he lives in town - she lives in [place].  SUMMARY OF RECOMMENDATIONS   -Continue current medical treatment -Continue DNR/DNI as previously documented -Patient and family's goal is for her to discharge home with HHPT -Family wondering if a home health RN could also be available for a short time - not sure if this is feasible -Recommended outpatient Palliative at discharge and family was agreeable - TOC consult placed; TOC and [place] liaison notified -DNR and MOST form were completed - copy placed in Media tab in EMR and hard copies placed in shadow chart -MOST form was completed as follows: DNR, Limited Additional Interventions, Determine use or limitation of antibiotics, IV fluids for a defined trail period, No feeding tube  Code  Status/Advance Care Planning:  DNR  Symptom Management:   Weakness - education provided on active ROM exercises and how family can perform PT exercises on the days PT does not visit.  Palliative Prophylaxis:   Aspiration, Bowel Regimen, Delirium Protocol, Frequent Pain Assessment, Oral Care and Turn Reposition  Additional Recommendations (Limitations, Scope, Preferences):  Full Scope Treatment  Psycho-social/Spiritual:   Desire for further Chaplaincy support:no  Created space and opportunity for patient and family to express thoughts and feelings regarding patient's current medical situation.  Emotional support and therapeutic listening provided.  Prognosis:   Unable to determine  Discharge Planning: Home with Palliative Services and HHPT      Primary Diagnoses: Present on Admission:  Ischemia of right lower extremity   I have reviewed the medical record, interviewed the patient and family, and examined the patient. The following aspects are pertinent.  Past Medical History:  Diagnosis Date   A-fib [facility])    CHF (congestive heart failure) (HCC)    Diabetes mellitus without complication (  [place])    Hypertension    Social History   Socioeconomic History   Marital status: Widowed    Spouse name: Not on file   Number of children: Not on file   Years of education: Not on file   Highest education level: Not on file  Occupational History   Not on file  Tobacco Use   Smoking status: Never Smoker   Smokeless tobacco: Never Used  Vaping Use   Vaping Use: Never used  Substance and Sexual Activity   Alcohol use: No   Drug use: No   Sexual activity: Not on file  Other Topics Concern   Not on file  Social History Narrative   Husband passed away in 05-08-23, was married 81 years. Lives at home and children come in and take turns staying the night with her.   Social Determinants of Health   Financial Resource Strain:    Difficulty of Paying  Living Expenses:   Food Insecurity:    Worried About Charity fundraiser in the Last Year:    Arboriculturist in the Last Year:   Transportation Needs:    Film/video editor (Medical):    Lack of Transportation (Non-Medical):   Physical Activity:    Days of Exercise per Week:    Minutes of Exercise per Session:   Stress:    Feeling of Stress :   Social Connections:    Frequency of Communication with Friends and Family:    Frequency of Social Gatherings with Friends and Family:    Attends Religious Services:    Active Member of Clubs or Organizations:    Attends Music therapist:    Marital Status:    Family History  Problem Relation Age of Onset   Cancer Brother    Scheduled Meds:  vitamin C  250 mg Oral BID   aspirin EC  81 mg Oral Daily   carvedilol  3.125 mg Oral BID WC   ciprofloxacin  250 mg Oral Q24H   docusate  100 mg Oral BID   feeding supplement (ENSURE ENLIVE)  237 mL Oral BID BM   insulin aspart  0-5 Units Subcutaneous QHS   insulin aspart  0-9 Units Subcutaneous TID WC   insulin aspart  2 Units Subcutaneous TID WC   levothyroxine  88 mcg Oral QAC breakfast   megestrol  400 mg Oral Daily   multivitamin with minerals  1 tablet Oral Daily   nystatin  5 mL Oral QID   sodium chloride flush  3 mL Intravenous Q12H   Continuous Infusions:  sodium chloride 75 mL/hr at 10/22/19 1155   PRN Meds:.acetaminophen **OR** acetaminophen, gabapentin, ondansetron (ZOFRAN) IV, oxyCODONE, polyethylene glycol, senna-docusate Allergies  Allergen Reactions   Sulfa Antibiotics Hives   Review of Systems  Constitutional: Positive for activity change.  Respiratory: Negative for shortness of breath.     Physical Exam Constitutional:      General: She is not in acute distress. Pulmonary:     Effort: Pulmonary effort is normal. No respiratory distress.  Skin:    General: Skin is warm and dry.     Comments: Right AKA    Neurological:     Mental Status: She is alert.     Motor: Weakness present.  Psychiatric:        Mood and Affect: Mood normal.        Behavior: Behavior is cooperative.     Vital Signs: BP (!) 118/52 (BP Location:  Left Arm)    Pulse 81    Temp 98.2 F (36.8 C) (Oral)    Resp 18    Ht 5' 3" (1.6 m)    Wt 55.3 kg    SpO2 100%    BMI 21.60 kg/m  Pain Scale: 0-10 POSS *See Group Information*: S-Acceptable,Sleep, easy to arouse Pain Score: 0-No pain   SpO2: SpO2: 100 % O2 Device:SpO2: 100 % O2 Flow Rate: .O2 Flow Rate (L/min): 3 L/min  IO: Intake/output summary:   Intake/Output Summary (Last 24 hours) at 10/22/2019 1457 Last data filed at 10/22/2019 1155 Gross per 24 hour  Intake 351.85 ml  Output 1200 ml  Net -848.15 ml    LBM: Last BM Date: 10/21/19 Baseline Weight: Weight: 53.1 kg Most recent weight: Weight: 55.3 kg     Palliative Assessment/Data: PPS 40%   Discussed case with primary RN, patient, son, daughter, Nikki Dom, TOC, [place] liaison  Time In: 1335 Time Out: 8921 Time Total: 70 minutes Greater than 50%  of this time was spent counseling and coordinating care related to the above assessment and plan.   M. Tamala Julian, MSN, FNP-BC Palliative Medicine Team [phone]

## 2019-10-22 NOTE — Progress Notes (Addendum)
Speech Language Pathology Treatment: Dysphagia  Patient Details Name: [name] MRN: [id number] DOB: 1925/05/10 Today's Date: 10/22/2019 Time: 9030-0923 SLP Time Calculation (min) (ACUTE ONLY): 35 min  Assessment / Plan / Recommendation Clinical Impression  Pt seen for ongoing assessment of swallowing and toleration of diet; education re: swallowing, diet consistency rec'd, and general aspiration precautions. Pt does have a Baseline of REFLUX s/s - general Reflux precautions rec'd as well. Family present in room so education was discussed w/ them as well as pt for home carry-over. Pt is s/p right above-the-knee amputation -10/10/19. She initially c/o her throat "hurting" when swallowing post surgery(which included oral intubation for the surgery), but now this has resolved per Daughter, but pt's appetite/desire is "less" than before. Pt and family have met w/ Palliative Care for [place] at D/C.  Discussed w/ pt/family the current diet consistency of minced foods for ease of oral intake, mastication -- gumming and mashing d/t min loose Denture status(per family) and overall weakness. Pt stated her appetite was "ok" but she and family endorsed she will eat "better" when she can eat the "foods she likes, cooked the way she likes". Acknowledged this to be quite true -- also encouraged "fun" foods of interest. Talked w/ pt/family about reducing her risk for aspiration using general aspiration precautions to include Small sips Slowly, No straws if increased coughing noted, and Sit fully Upright w/ all oral intake. Recommended reducing distractions including Talking at meals. Also discussed food consistencies and options, preparation, and use of condiments to soften foods for ease especially w/ loose Dentures. Encouraged pt/family to breakdown the foods well and moisten using gravies b/f eating not only for easier oropharyngeal phase of swallowing but also the Esophageal phase d/t her Baseline of Reflux s/s she  c/o prior. Pt agreed. She endorsed certain foods at home (cooked) were easy to eat foods.  The above was discussed w/ NSG; Handouts on diet consistency recommended as well as aspiration precautions and Pill swallowing options discussed. Handouts on general Reflux precautions; Esophageal motility strategies. Discussed the benefit of the MINCED Meats consistency diet for easier Esophageal clearing and for ease of oral phase management. Rec'd gravies to moisten foods; soups.   Recommend continue a Dysphagia level 2 (minced foods) diet w/ Thin liquids; aspiration precautions; REFLUX precautions; Pill in Puree for safer swallowing. Pt agreed stating the ease of the diet also. ST services will sign off at this timeas pt appears at her Baseline w/ oropharyngeal phase swallowing; Education completed w/ pt and w/ family. NSG to reconsult if new needs arise while admitted. Pt and NSG agreed.      HPI HPI: Pt is a 86 yo female pt admitted for limb threatening ischemia.  PMH includes: Afib, CHF, DM, HTN, HLD; also c/o s/s of Reflux.  Vascular Surgery is following for Atherosclerotic occlusive disease bilateral lower extremities with rest pain and ischemia of the right lower extremity.  Prior to this admit, pt lived at home able to do some general ADLs including manage medications independently, and a sponge bath. Family reported that children take turns staying overnight with the pt and that "someone is typically there with the pt most of the day but she might be home for an hour or two by herself.".  Son reported pt was "not a big eater anymore for the past few years".  No recent CXR/imaging; BS clear per MD notes.      SLP Plan  All goals met  Recommendations  Diet recommendations: Dysphagia 2 (fine chop);Thin liquid Liquids provided via: Cup;Straw (monitor straw use of coughing) Medication Administration: Whole meds with puree (for safer ease of swallowing) Supervision: Patient able to self  feed;Intermittent supervision to cue for compensatory strategies Compensations: Minimize environmental distractions;Slow rate;Small sips/bites;Lingual sweep for clearance of pocketing;Follow solids with liquid Postural Changes and/or Swallow Maneuvers: Seated upright 90 degrees;Upright 30-60 min after meal (REFLUX precautions)                General recommendations:  (dietician f/u) Oral Care Recommendations: Oral care BID;Oral care before and after PO;Staff/trained caregiver to provide oral care Follow up Recommendations: None SLP Visit Diagnosis: Dysphagia, unspecified (R13.10) (Esophageal phase dysmotility; Reflux) Plan: All goals met       GO                 Kathleen Kenner, MS, CCC-SLP [name] 10/22/2019, 2:08 PM

## 2019-10-22 NOTE — Progress Notes (Signed)
[facility] Daily Progress Note   Subjective: 10/10/19: Right above-the-knee amputation  Patient without complaint.  Objective: Vitals:   10/21/19 2000 10/22/19 0323 10/22/19 0326 10/22/19 1208  BP: 138/84 (!) 159/75  (!) 118/52  Pulse: 85 74  81  Resp: 20 (!) 22  18  Temp: 98.1 F (36.7 C) 98.3 F (36.8 C)  98.2 F (36.8 C)  TempSrc: Oral Oral  Oral  SpO2: 100% 100%  100%  Weight:   55.3 kg   Height:        Intake/Output Summary (Last 24 hours) at 10/22/2019 1626 Last data filed at 10/22/2019 1155 Gross per 24 hour  Intake 351.85 ml  Output 1200 ml  Net -848.15 ml   Physical Exam: A&Ox3, NAD CV: RRR Pulmonary: CTA Bilaterally Abdomen: Soft, Nontender, Nondistended Vascular: Right lower extremity:Thigh soft. Stump is healthy. Dressing is clean and dry   Laboratory: CBC    Component Value Date/Time   WBC 10.6 (H) 10/22/2019 0541   HGB 7.7 (L) 10/22/2019 0541   HCT 24.7 (L) 10/22/2019 0541   PLT 392 10/22/2019 0541   BMET    Component Value Date/Time   NA 142 10/22/2019 0541   NA 141 08/03/2011 0729   K 5.5 (H) 10/22/2019 0541   K 3.6 08/03/2011 0729   CL 109 10/22/2019 0541   CL 105 08/03/2011 0729   CO2 29 10/22/2019 0541   CO2 26 08/03/2011 0729   GLUCOSE 119 (H) 10/22/2019 0541   GLUCOSE 149 (H) 08/03/2011 0729   BUN 25 (H) 10/22/2019 0541   BUN 16 08/03/2011 0729   CREATININE 1.34 (H) 10/22/2019 0541   CREATININE 1.07 08/03/2011 0729   CALCIUM 9.0 10/22/2019 0541   CALCIUM 9.5 08/03/2011 0729   GFRNONAA 34 (L) 10/22/2019 0541   GFRNONAA 48 (L) 08/03/2011 0729   GFRAA 40 (L) 10/22/2019 0541   GFRAA 56 (L) 08/03/2011 0729   Assessment/Planning: The patient is a 86 year old female with chronic ischemia to the right lower extremity. Status post right above-the-knee amputation -10/10/19  1) CODE STATUS: Patient's family had provided the hospital with written formal DNR status.  CODE STATUS updated in  epic.  2) Palliative care consult: Patient's family would like to have her discharged home with home services.  They have agreed to outpatient palliative follow-up.  Social work is currently working on home health services.  Possible discharge home tomorrow if services can be initiated.  3) Hyperkalemia: Lokemia given today BMP in a.m.  4) Renal function: IV fluids restarted BMP in a.m.  5) Lack of appetite: Megace started  Discussed with Dr. Eber Hong Tesia Lybrand PA-C 10/22/2019 4:26 PM

## 2019-10-22 NOTE — Progress Notes (Signed)
OT Cancellation Note  Patient Details Name: [name] MRN: [id number] DOB: October 14, 1925   Cancelled Treatment:    Reason Eval/Treat Not Completed: Medical issues which prohibited therapy. Chart reviewed. Pt K+ noted to be critically high, 5.5. Contraindicated for participation with therapy at this time. Will hold and re-attempt at later date/time as medically appropriate.   Jeni Salles, MPH, MS, OTR/L ascom [phone] 10/22/19, 11:20 AM

## 2019-10-22 NOTE — Progress Notes (Signed)
   10/22/19 1200  Clinical Encounter Type  Visited With Patient and family together  Visit Type Follow-up  Referral From Chaplain  Consult/Referral To Chaplain  Chaplain briefly visited with patient ans her family. Daughter said they are doing well because they believe patient is being discharged today. Patient is sitting up in bed. Chaplain told patient that she has heard a lot of good things about her and patient smiled. Chaplain did not stay long because another staff member was in the room talking to patient. Daughter thanked chaplain for stopping by. Chaplain told her she was welcome, wished them well, and left.

## 2019-10-22 NOTE — Progress Notes (Signed)
[facility] hospital liaison note:  New referral for [facility] community Palliative to follow at home received from Palliative NP [employer]. TOC Stephanie Bowen aware.  Patient information given to referral. Thank you. Flo Shanks BSN, RN, [place]  [phone]

## 2019-10-22 NOTE — TOC Progression Note (Signed)
Transition of Care [facility]) - Progression Note    Patient Details  Name: [name] MRN: [id number] Date of Birth: 08/24/1925  [facility]) CM/SW Contact  Zigmund Daniel Dorian Pod, RN Phone Number: 10/22/2019, 12:01 AM  Clinical Narrative:     [facility] RN spoke with son Briyonna Omara and POA daughter Lequita Asal at bedside. Family waiting consult with Palliative prior to making a final decision on pt's discharge disposition (HHPT or SNF).   TOC will continue to follow.  Expected Discharge Plan: [place] Barriers to Discharge: Continued Medical Work up  Expected Discharge Plan and Services Expected Discharge Plan: [place]   Discharge Planning Services: CM Consult   Living arrangements for the past 2 months: Single Family Home                                       Social Determinants of Health (SDOH) Interventions    Readmission Risk Interventions No flowsheet data found.

## 2019-10-22 NOTE — Progress Notes (Signed)
PT Cancellation Note  Patient Details Name: [name] MRN: [id number] DOB: Jun 14, 1925   Cancelled Treatment:    Reason Eval/Treat Not Completed: Medical issues which prohibited therapy   Potassium 5.5 this am.  Will hold per protocol and continue as appropriate.   Chesley Noon 10/22/2019, 8:53 AM

## 2019-10-23 ENCOUNTER — Inpatient Hospital Stay: Payer: Medicare Other

## 2019-10-23 LAB — BASIC METABOLIC PANEL
Anion gap: 3 — ABNORMAL LOW (ref 5–15)
BUN: 19 mg/dL (ref 8–23)
CO2: 29 mmol/L (ref 22–32)
Calcium: 8.7 mg/dL — ABNORMAL LOW (ref 8.9–10.3)
Chloride: 109 mmol/L (ref 98–111)
Creatinine, Ser: 1.13 mg/dL — ABNORMAL HIGH (ref 0.44–1.00)
GFR calc Af Amer: 49 mL/min — ABNORMAL LOW (ref >60)
GFR calc non Af Amer: 42 mL/min — ABNORMAL LOW (ref >60)
Glucose, Bld: 109 mg/dL — ABNORMAL HIGH (ref 70–99)
Potassium: 4.7 mmol/L (ref 3.5–5.1)
Sodium: 141 mmol/L (ref 135–145)

## 2019-10-23 LAB — MAGNESIUM: Magnesium: 1.9 mg/dL (ref 1.7–2.4)

## 2019-10-23 LAB — CBC
HCT: 24.3 % — ABNORMAL LOW (ref 36.0–46.0)
Hemoglobin: 7.4 g/dL — ABNORMAL LOW (ref 12.0–15.0)
MCH: 28.8 pg (ref 26.0–34.0)
MCHC: 30.5 g/dL (ref 30.0–36.0)
MCV: 94.6 fL (ref 80.0–100.0)
Platelets: 375 10*3/uL (ref 150–400)
RBC: 2.57 MIL/uL — ABNORMAL LOW (ref 3.87–5.11)
RDW: 14.3 % (ref 11.5–15.5)
WBC: 10.1 10*3/uL (ref 4.0–10.5)
nRBC: 0 % (ref 0.0–0.2)

## 2019-10-23 LAB — GLUCOSE, CAPILLARY
Glucose-Capillary: 86 mg/dL (ref 70–99)
Glucose-Capillary: 156 mg/dL — ABNORMAL HIGH (ref 70–99)
Glucose-Capillary: 140 mg/dL — ABNORMAL HIGH (ref 70–99)
Glucose-Capillary: 126 mg/dL — ABNORMAL HIGH (ref 70–99)

## 2019-10-23 MED ORDER — SODIUM CHLORIDE 0.9 % IV SOLN
INTRAVENOUS | Status: DC | PRN
  Administered 2019-10-23: 250 mL via INTRAVENOUS

## 2019-10-23 MED ORDER — FUROSEMIDE 10 MG/ML IJ SOLN
20.0000 mg | Freq: Once | INTRAMUSCULAR | Status: AC
  Administered 2019-10-23: 20 mg via INTRAVENOUS
  Filled 2019-10-23: qty 4

## 2019-10-23 MED ORDER — VANCOMYCIN HCL IN DEXTROSE 1-5 GM/200ML-% IV SOLN: 1000.0000 mg | Freq: Once | INTRAVENOUS | Status: DC

## 2019-10-23 MED ORDER — LEVALBUTEROL HCL 0.63 MG/3ML IN NEBU
0.6300 mg | INHALATION_SOLUTION | Freq: Once | RESPIRATORY_TRACT | Status: AC
  Administered 2019-10-23: 0.63 mg via RESPIRATORY_TRACT
  Filled 2019-10-23: qty 3

## 2019-10-23 MED ORDER — PIPERACILLIN-TAZOBACTAM 3.375 G IVPB
3.3750 g | Freq: Three times a day (TID) | INTRAVENOUS | Status: DC
  Administered 2019-10-23 – 2019-10-25 (×5): 3.375 g via INTRAVENOUS
  Filled 2019-10-23 (×5): qty 50

## 2019-10-23 MED ORDER — VANCOMYCIN HCL 1250 MG/250ML IV SOLN
1250.0000 mg | Freq: Once | INTRAVENOUS | Status: AC
  Administered 2019-10-23: 1250 mg via INTRAVENOUS
  Filled 2019-10-23 (×2): qty 250

## 2019-10-23 MED ORDER — VANCOMYCIN HCL 500 MG/100ML IV SOLN
500.0000 mg | INTRAVENOUS | Status: DC
  Administered 2019-10-24: 500 mg via INTRAVENOUS
  Filled 2019-10-23 (×2): qty 100

## 2019-10-23 MED ORDER — PIPERACILLIN-TAZOBACTAM 3.375 G IVPB: 3.3750 g | Freq: Three times a day (TID) | INTRAVENOUS | Status: DC

## 2019-10-23 NOTE — Progress Notes (Signed)
Received HPOA from daughter. Placed copy in patient's chart.   Fuller Mandril, RN

## 2019-10-23 NOTE — Progress Notes (Signed)
Pharmacy Antibiotic Note  [name] is a 86 y.o. female admitted on 10/03/2019. Pharmacy has been consulted for vancomycin dosing for pneumonia.  Plan: Vancomycin 1250 mg IV x 1 followed by 500 mg IV q24h  Zosyn 3.375 g IV q8h extended infusion  Height: 5\' 3"  (160 cm) Weight: 55.8 kg (123 lb 0.3 oz) IBW/kg (Calculated) : 52.4  Temp (24hrs), Avg:97.8 F (36.6 C), Min:97.4 F (36.3 C), Max:98.1 F (36.7 C)  Recent Labs  Lab 10/19/19 0452 10/20/19 0726 10/21/19 0605 10/22/19 0541 10/23/19 0421  WBC 10.5 8.5 8.8 10.6* 10.1  CREATININE 1.40* 1.24* 1.19* 1.34* 1.13*    Estimated Creatinine Clearance: 26.3 mL/min (A) (by C-G formula based on SCr of 1.13 mg/dL (H)).    Allergies  Allergen Reactions  . Sulfa Antibiotics Hives    Antimicrobials this admission: Ciprofloxacin 7/8 >> 7/13 Vancomycin 7/13 >> Zosyn 7/13 >>   Microbiology results: 7/07 UCx: pseudomonas (pan sensitive)  6/23 MRSA PCR: negative  Thank you for allowing pharmacy to be a part of this patient's care.  Tawnya Crook, PharmD 10/23/2019 5:25 PM

## 2019-10-23 NOTE — Progress Notes (Signed)
[facility]  ROUNDING NOTE   Subjective:     Objective:  Vital signs in last 24 hours:  Temp:  [97.4 F (36.3 C)-98.1 F (36.7 C)] 97.4 F (36.3 C) (07/13 1412) Pulse Rate:  [81-88] 83 (07/13 1412) Resp:  [18-28] 28 (07/13 1412) BP: (111-155)/(55-78) 155/73 (07/13 1412) SpO2:  [90 %-100 %] 100 % (07/13 1412) Weight:  [55.8 kg] 55.8 kg (07/13 0418)  Weight change: 0.5 kg Filed Weights   10/20/19 0500 10/22/19 0326 10/23/19 0418  Weight: 52.7 kg 55.3 kg 55.8 kg    Intake/Output: I/O last 3 completed shifts: In: 1269.6 [I.V.:1269.6] Out: 1500 [Urine:1500]   Intake/Output this shift:  Total I/O In: 480 [I.V.:480] Out: 500 [Urine:500]  Physical Exam: General: No acute distress  Head: Normocephalic, atraumatic. Moist oral mucosal membranes  Eyes: Anicteric  Neck: Supple, trachea midline  Lungs:  Clear to auscultation, normal effort  Heart: S1S2 no rubs  Abdomen:  Soft, nontender, bowel sounds present  Extremities:  peripheral edema.  Neurologic: Awake, alert, following commands  Skin: No lesions  Access:     Basic Metabolic Panel: Recent Labs  Lab 10/19/19 0452 10/19/19 0452 10/20/19 0726 10/20/19 0726 10/21/19 0605 10/22/19 0541 10/23/19 0421  NA 139  --  141  --  139 142 141  K 5.1  --  4.7  --  5.1 5.5* 4.7  CL 106  --  108  --  109 109 109  CO2 26  --  26  --  _0 GLUCOSE 103*  --  105*  --  116* 119* 109*  BUN 40*  --  34*  --  29* 25* 19  CREATININE 1.40*  --  1.24*  --  1.19* 1.34* 1.13*  CALCIUM 8.6*   < > 8.6*   < > 8.6* 9.0 8.7*  MG 2.3  --  2.2  --  2.0 2.0 1.9   < > = values in this interval not displayed.    Liver Function Tests: No results for input(s): AST, ALT, ALKPHOS, BILITOT, PROT, ALBUMIN in the last 168 hours. No results for input(s): LIPASE, AMYLASE in the last 168 hours. No results for input(s): AMMONIA in the last 168 hours.  CBC: Recent Labs  Lab 10/19/19 0452 10/20/19 0726 10/21/19 0605  10/22/19 0541 10/23/19 0421  WBC 10.5 8.5 8.8 10.6* 10.1  HGB 8.5* 7.3* 7.3* 7.7* 7.4*  HCT 26.4* 24.0* 24.3* 24.7* 24.3*  MCV 91.7 94.5 94.9 92.9 94.6  PLT 393 412* 381 392 375    Cardiac Enzymes: No results for input(s): CKTOTAL, CKMB, CKMBINDEX, TROPONINI in the last 168 hours.  BNP: Invalid input(s): POCBNP  CBG: Recent Labs  Lab 10/22/19 1703 10/22/19 2126 10/23/19 0741 10/23/19 1205 10/23/19 1645  GLUCAP 181* 133* 86 156* 140*    Microbiology: Results for orders placed or performed during the hospital encounter of 10/03/19  SARS Coronavirus 2 by RT PCR (hospital order, performed in [facility] hospital lab) Nasopharyngeal Nasopharyngeal Swab     Status: None   Collection Time: 10/03/19  2:58 PM   Specimen: Nasopharyngeal Swab  Result Value Ref Range Status   SARS Coronavirus 2 NEGATIVE NEGATIVE Final    Comment: (NOTE) SARS-CoV-2 target nucleic acids are NOT DETECTED.  The SARS-CoV-2 RNA is generally detectable in upper and lower respiratory specimens during the acute phase of infection. The lowest concentration of SARS-CoV-2 viral copies this assay can detect is 250 copies / mL. A negative result does not preclude  SARS-CoV-2 infection and should not be used as the sole basis for treatment or other patient management decisions.  A negative result may occur with improper specimen collection / handling, submission of specimen other than nasopharyngeal swab, presence of viral mutation(s) within the areas targeted by this assay, and inadequate number of viral copies (<250 copies / mL). A negative result must be combined with clinical observations, patient history, and epidemiological information.  Fact Sheet for Patients:   StrictlyIdeas.no  Fact Sheet for Healthcare Providers: BankingDealers.co.za  This test is not yet approved or  cleared by the [place] FDA and has been authorized for detection and/or  diagnosis of SARS-CoV-2 by FDA under an Emergency Use Authorization (EUA).  This EUA will remain in effect (meaning this test can be used) for the duration of the COVID-19 declaration under Section 564(b)(1) of the Act, 21 U.S.C. section 360bbb-3(b)(1), unless the authorization is terminated or revoked sooner.  Performed at [facility], [address] [place], [place] [address]   MRSA PCR Screening     Status: None   Collection Time: 10/03/19  6:36 PM   Specimen: Nasal Mucosa; Nasopharyngeal  Result Value Ref Range Status   MRSA by PCR NEGATIVE NEGATIVE Final    Comment:        The GeneXpert MRSA Assay (FDA approved for NASAL specimens only), is one component of a comprehensive MRSA colonization surveillance program. It is not intended to diagnose MRSA infection nor to guide or monitor treatment for MRSA infections. Performed at [facility], [address] [place], [place] [address]   Urine Culture     Status: Abnormal   Collection Time: 10/17/19  6:10 PM   Specimen: Urine, Random  Result Value Ref Range Status   Specimen Description   Final    URINE, RANDOM Performed at [facility], [address] [place], [place] [address]    Special Requests   Final    NONE Performed at [facility], [place], [place] [address]    Culture >=100,000 COLONIES/mL PSEUDOMONAS AERUGINOSA (A)  Final   Report Status 10/19/2019 FINAL  Final   Organism ID, Bacteria PSEUDOMONAS AERUGINOSA (A)  Final      Susceptibility   Pseudomonas aeruginosa - MIC*    CEFTAZIDIME 4 SENSITIVE Sensitive     CIPROFLOXACIN <=0.25 SENSITIVE Sensitive     GENTAMICIN <=1 SENSITIVE Sensitive     IMIPENEM 2 SENSITIVE Sensitive     PIP/TAZO 16 SENSITIVE Sensitive     CEFEPIME 2 SENSITIVE Sensitive     * >=100,000 COLONIES/mL PSEUDOMONAS AERUGINOSA    Coagulation Studies: No results for input(s): LABPROT, INR in the last 72 hours.  Urinalysis: No  results for input(s): COLORURINE, LABSPEC, PHURINE, GLUCOSEU, HGBUR, BILIRUBINUR, KETONESUR, PROTEINUR, UROBILINOGEN, NITRITE, LEUKOCYTESUR in the last 72 hours.  Invalid input(s): APPERANCEUR    Imaging: DG Chest Port 1 View  Result Date: 10/23/2019 CLINICAL DATA:  Shortness of breath EXAM: PORTABLE CHEST 1 VIEW COMPARISON:  October 14, 2019 FINDINGS: There is focal consolidation in the left base with minimal left pleural effusion. There is slight right base atelectasis. There is mild scarring in the medial right upper lobe. Lungs otherwise are clear. There is cardiomegaly with pulmonary vascularity normal. No adenopathy. There is aortic atherosclerosis. No bone lesions. IMPRESSION: Airspace opacity concerning for pneumonia left base with minimal left pleural effusion. Areas of mild scarring and atelectasis on the right. Stable cardiomegaly. No adenopathy. Aortic Atherosclerosis (ICD10-I70.0). Electronically Signed  By: Lowella Grip III M.D.   On: 10/23/2019 14:52     Medications:   . sodium chloride 75 mL/hr at 10/23/19 1024  . piperacillin-tazobactam (ZOSYN)  IV    . vancomycin    . [START ON 10/24/2019] vancomycin     . vitamin C  250 mg Oral BID  . aspirin EC  81 mg Oral Daily  . carvedilol  3.125 mg Oral BID WC  . docusate  100 mg Oral BID  . feeding supplement (ENSURE ENLIVE)  237 mL Oral BID BM  . insulin aspart  0-5 Units Subcutaneous QHS  . insulin aspart  0-9 Units Subcutaneous TID WC  . insulin aspart  2 Units Subcutaneous TID WC  . levothyroxine  88 mcg Oral QAC breakfast  . megestrol  400 mg Oral Daily  . multivitamin with minerals  1 tablet Oral Daily  . nystatin  5 mL Oral QID  . sodium chloride flush  3 mL Intravenous Q12H   acetaminophen **OR** acetaminophen, gabapentin, ondansetron (ZOFRAN) IV, oxyCODONE, polyethylene glycol, senna-docusate  Assessment/ Plan:  86 y.o. female with past medical history of hypothyroidism, gout, diabetes mellitus type 2,  hyperlipidemia, hypertension, mitral insufficiency, aortic valve stenosis, intracranial hemorrhage 11/19, atrial fibrillation who is status post right above-the-knee amputation.  1.  Chronic kidney disease stage IIIb/diabetes mellitus type 2 with chronic kidney disease. 2.  Hypertension. 3.  Anemia of chronic kidney disease.  Plan: The patient's creatinine is currently under flux as she has had muscle loss.  Therefore her creatinine is expected to be lower.  Previously in the office her EGFR was in the 20s.  Agree with IV fluid hydration at this time.  Okay to administer Veltassa periodically for hyperkalemia while admitted.  However the larger question remains regarding her disposition.  I had a long discussion with the patient's family.  They are potentially considering palliative and hospice care which certainly would be reasonable in this case.  They will discuss this further with their mother.   LOS: 20 Jamacia Jester 7/13/20216:03 PM

## 2019-10-23 NOTE — Progress Notes (Signed)
Per daughter patient woke up complaining of having difficulty breathing. Patient was observed to have shallow and rapid breathing. Patient currently on 2L [place] acute with O2 Sats low 90's. Increased O2 to 3L and repositioned patient upright in bed to assist with breathing. MD contacted. Orders pending: STAT CXR, breathing treatment and IVP 20mg  lasix.   Fuller Mandril, RN

## 2019-10-23 NOTE — TOC Progression Note (Addendum)
Transition of Care [facility]) - Progression Note    Patient Details  Name: [name] MRN: [id number] Date of Birth: 1925-09-28  Transition [facility]) CM/SW Contact  [name], RN Phone Number: 10/23/2019, 5:07 PM  Clinical Narrative:    7/12 - TOC notified by palliative that plan would be for patient to return home with home health services and outpatient palliative.  [name] with [employer] aware.  TOC confirmed plan with patient and daughter [name].  [name] states that she does not have a preference of home health agency.  RNCM reached out to all 8 home health agencies on medicare.gov that cover the area, all home health agencies declined.  TOC leadership was notified, and they have notified [facility] leadership.  PA, and daughter notified.   7/13 [name] with [facility] home health is able to accept referral.  Start of care would be 4-5 days from discharge.  TOC met with patient, daughter, and son.  They are in agreement to accept services and are aware of the delay in start of care. Medical team notified.  MD has discussed case with ethics and it was determined that MD, PA, TOC, palliative, and chaplin were to meet with family to discuss disposition . Awaiting time to be determined when all disciplines can meet.   At this time family is not interested in [facility], and are not ready to transition to hospice   Update:  Per MD requiring increased level of O2, and dx PNA. TOC to follow up tomorrow    Expected Discharge Plan: [place] Barriers to Discharge: Continued Medical Work up  Expected Discharge Plan and Services Expected Discharge Plan: [place]   Discharge Planning Services: CM Consult   Living arrangements for the past 2 months: Single Family Home                                       Social Determinants of Health (SDOH) Interventions    Readmission Risk Interventions No flowsheet data found.

## 2019-10-23 NOTE — Progress Notes (Signed)
Patient responded to treatment. Patient breathing controlled and patient resting comfortably. Awaiting CXR results. Will continue to monitor.   Fuller Mandril, RN

## 2019-10-23 NOTE — Progress Notes (Signed)
PT Cancellation Note  Patient Details Name: [name] MRN: [id number] DOB: 1926/01/26   Cancelled Treatment:    Reason Eval/Treat Not Completed: Medical issues which prohibited therapy  Patient currently awaiting STAT chest X-ray after complaining of shortness of breath. PT will hold and follow up as appropriate    [name] 10/23/2019, 2:40 PM

## 2019-10-23 NOTE — Progress Notes (Addendum)
OT Cancellation Note  Patient Details Name: [name] MRN: [id number] DOB: 11/25/1925   Cancelled Treatment:    Reason Eval/Treat Not Completed: Other (comment). Upon attempt, two RNs in room assessing pt. Pt noted to be in Afib per the telemetry monitor. Will re-attempt at later date/time as pt is appropriate and available.   Addendum 2:34pm: Spoke with RN after she left pt's room. Per RN, pt will be getting a STAT chest X-ray and breathing treatment after waking up recently and reporting difficulty breathing. Concern for possible aspiration. Will hold therapy this date and re-attempt at later date/time as pt is medically appropriate.    Jeni Salles, MPH, MS, OTR/L ascom [phone] 10/23/19, 2:22 PM

## 2019-10-23 NOTE — Progress Notes (Signed)
[facility] Daily Progress Note  Subjective: 10/10/19: Right above-the-knee amputation  Patient without complaint.  Objective: Vitals:   10/23/19 0930 10/23/19 0957 10/23/19 1204 10/23/19 1412  BP:   (!) 111/55 (!) 155/73  Pulse:   83 83  Resp:   18 (!) 28  Temp:    (!) 97.4 F (36.3 C)  TempSrc:    Oral  SpO2: 90% 98% 98% 100%  Weight:      Height:        Intake/Output Summary (Last 24 hours) at 10/23/2019 1702 Last data filed at 10/23/2019 1218 Gross per 24 hour  Intake 1637.65 ml  Output 500 ml  Net 1137.65 ml   Physical Exam: A&Ox3, NAD CV: RRR Pulmonary: CTA Bilaterally Abdomen: Soft, Nontender, Nondistended Vascular: Right lower extremity:Thigh soft. Stump is healthy. Dressing is clean and dry   Laboratory: CBC    Component Value Date/Time   WBC 10.1 10/23/2019 0421   HGB 7.4 (L) 10/23/2019 0421   HCT 24.3 (L) 10/23/2019 0421   PLT 375 10/23/2019 0421   BMET    Component Value Date/Time   NA 141 10/23/2019 0421   NA 141 08/03/2011 0729   K 4.7 10/23/2019 0421   K 3.6 08/03/2011 0729   CL 109 10/23/2019 0421   CL 105 08/03/2011 0729   CO2 29 10/23/2019 0421   CO2 26 08/03/2011 0729   GLUCOSE 109 (H) 10/23/2019 0421   GLUCOSE 149 (H) 08/03/2011 0729   BUN 19 10/23/2019 0421   BUN 16 08/03/2011 0729   CREATININE 1.13 (H) 10/23/2019 0421   CREATININE 1.07 08/03/2011 0729   CALCIUM 8.7 (L) 10/23/2019 0421   CALCIUM 9.5 08/03/2011 0729   GFRNONAA 42 (L) 10/23/2019 0421   GFRNONAA 48 (L) 08/03/2011 0729   GFRAA 49 (L) 10/23/2019 0421   GFRAA 56 (L) 08/03/2011 0729   Assessment/Planning: The patient is a 86 year old female with chronic ischemia to the right lower extremity. Status post right above-the-knee amputation -10/10/19  1) CODE STATUS / HCPOA: Patient's family has provided the hospital with written formal DNR status. CODE STATUS updated in epic.  Family has also submitted healthcare power of attorney  which is her daughter.  2) Palliative care consult: Patient's family would like to have her discharged home with home services.  They have agreed to outpatient palliative follow-up.  Social work is currently working on home health services however due to insurance as well as home services staffing the patients home physical therapy would not start for least 4 to 5 days.  Unsure if patient would have home nursing services.  At this point, I am unsure if the family is aware that if she is taking home she will most likely passed within a few days.  Trying to organize a multiple disciplinary team meeting between vascular surgery, social work, palliative care, chaplain.  3) Lack of appetite: Megace started  4) Pneumonia: Patient with shortness of breath today, increased oxygenation needs 3L Possible pneumonia on x-ray Started Vanco / Zosyn which I have asked pharmacy to help dose as the patient has chronic renal disease  Discussed with Dr. Eber Hong Larken Urias PA-C 10/23/2019 5:02 PM

## 2019-10-24 LAB — BASIC METABOLIC PANEL
Anion gap: 5 (ref 5–15)
BUN: 17 mg/dL (ref 8–23)
CO2: 28 mmol/L (ref 22–32)
Calcium: 8.5 mg/dL — ABNORMAL LOW (ref 8.9–10.3)
Chloride: 111 mmol/L (ref 98–111)
Creatinine, Ser: 1.23 mg/dL — ABNORMAL HIGH (ref 0.44–1.00)
GFR calc Af Amer: 44 mL/min — ABNORMAL LOW (ref >60)
GFR calc non Af Amer: 38 mL/min — ABNORMAL LOW (ref >60)
Glucose, Bld: 102 mg/dL — ABNORMAL HIGH (ref 70–99)
Potassium: 4.5 mmol/L (ref 3.5–5.1)
Sodium: 144 mmol/L (ref 135–145)

## 2019-10-24 LAB — CBC
HCT: 23.6 % — ABNORMAL LOW (ref 36.0–46.0)
Hemoglobin: 7.6 g/dL — ABNORMAL LOW (ref 12.0–15.0)
MCH: 29.5 pg (ref 26.0–34.0)
MCHC: 32.2 g/dL (ref 30.0–36.0)
MCV: 91.5 fL (ref 80.0–100.0)
Platelets: 351 10*3/uL (ref 150–400)
RBC: 2.58 MIL/uL — ABNORMAL LOW (ref 3.87–5.11)
RDW: 14.6 % (ref 11.5–15.5)
WBC: 10.1 10*3/uL (ref 4.0–10.5)
nRBC: 0 % (ref 0.0–0.2)

## 2019-10-24 LAB — GLUCOSE, CAPILLARY
Glucose-Capillary: 81 mg/dL (ref 70–99)
Glucose-Capillary: 146 mg/dL — ABNORMAL HIGH (ref 70–99)
Glucose-Capillary: 130 mg/dL — ABNORMAL HIGH (ref 70–99)
Glucose-Capillary: 137 mg/dL — ABNORMAL HIGH (ref 70–99)

## 2019-10-24 LAB — MAGNESIUM: Magnesium: 1.7 mg/dL (ref 1.7–2.4)

## 2019-10-24 MED ORDER — FERROUS SULFATE 325 (65 FE) MG PO TABS
325.0000 mg | ORAL_TABLET | Freq: Every day | ORAL | Status: DC
  Administered 2019-10-25: 325 mg via ORAL
  Filled 2019-10-24: qty 1

## 2019-10-24 NOTE — Progress Notes (Signed)
[facility] Liaison note:  New referral for [facility] hospice services at home received from [facility].  Writer met in the room with [name] and her daughter [name] to initiate education regarding hospice services, philosophy and team approach to care with understanding voiced. Discussed focus of comfort at home with out lab draws and additional medications, both [name] and [name] voiced agreement. [name] was emotional at times, but did seem to support her mother's decision. Hospice contact number given to [facility]. DME needs discussed and requested to be delivered today, awaiting confirmation of delivery prior to patient being discharged. [name] will require EMS transport with signed  out of facility DNR in place. TOC and hospital care team updated. Will continue to follow through discharge. Thank you for the opportunity to be involved in the care of this patient and her family.   Flo Shanks BSN, RN, [facility] Liaison [employer] [phone]

## 2019-10-24 NOTE — Plan of Care (Signed)
Patient encouraged to use incentive spirometer to improve work of breathing. [name]

## 2019-10-24 NOTE — Progress Notes (Signed)
Nutrition Follow Up Note   DOCUMENTATION CODES:   Severe malnutrition in context of social or environmental circumstances  INTERVENTION:   Ensure Enlive po BID, each supplement provides 350 kcal and 20 grams of protein  Ice cream with lunch and dinner trays  MVI daily   Vitamin C 250m po BID  NUTRITION DIAGNOSIS:   Severe Malnutrition related to social / environmental circumstances (chronic poor appetite, advanced age) as evidenced by moderate to severe fat depletions, moderate to severe muscle depletions.  GOAL:   Patient will meet greater than or equal to 90% of their needs  -not met   MONITOR:   PO intake, Supplement acceptance, Labs, Weight trends, Skin, I & O's  ASSESSMENT:   86year old female with h/o DM, HTN and chronic ischemia to the right lower extremity who is admitted with gangrene and ischemia now s/p R AKA 6/30   Pt continues to have decreased appetite and oral intake. Pt is refusing most of the Ensure supplements; pt did not like the [employer] Pt is eating some ice cream with her meal trays. Megace initiated. Pt ate 100% of her lunch today which included tomato soup, milk and ice cream. Family meeting today; plan is for d/c tomorrow with hospice.   Medications reviewed and include: vitamin C, aspirin, colace, ferrous sulfate, insulin, synthroid, megace, MVI, NaCl _0 /hr, zosyn, vancomycin   Labs reviewed: creat 1.23(H) Hgb 7.6(L), Hct 23.6(L)  Diet Order:   Diet Order            DIET DYS 2 Room service appropriate? Yes with Assist; Fluid consistency: Thin  Diet effective now                EDUCATION NEEDS:   Education needs have been addressed  Skin:  Skin Assessment: Reviewed RN Assessment (Incision R leg)  Last BM:  7/13- type 4  Height:   Ht Readings from Last 1 Encounters:  10/10/19 _1  (1.6 m)    Weight:   Wt Readings from Last 1 Encounters:  10/24/19 54.8 kg    Ideal Body Weight:  52.3 kg  BMI:  Body mass index is  21.4 kg/m.  Estimated Nutritional Needs:   Kcal:  1300-1500kcal/day  Protein:  70-80g/day  Fluid:  >1.3L/day  CKoleen DistanceMS, RD, LDN Please refer to [facility]for RD and/or RD on-call/weekend/after hours pager

## 2019-10-24 NOTE — Progress Notes (Signed)
[facility] Daily Progress Note   Subjective: 10/10/19: Right above-the-knee amputation  Patient without complaint.  Objective: Vitals:   10/23/19 1953 10/24/19 0500 10/24/19 0539 10/24/19 1229  BP: 128/70  126/73 (!) 114/47  Pulse: 85  87 82  Resp: 18  20 16   Temp: 98.9 F (37.2 C)  98 F (36.7 C) 98.1 F (36.7 C)  TempSrc: Oral  Oral Oral  SpO2: 98%  94% 99%  Weight:  54.8 kg    Height:        Intake/Output Summary (Last 24 hours) at 10/24/2019 1409 Last data filed at 10/24/2019 1359 Gross per 24 hour  Intake 2231.7 ml  Output 1450 ml  Net 781.7 ml   Physical Exam: A&Ox3, NAD CV: RRR Pulmonary: CTA Bilaterally Abdomen: Soft, Nontender, Nondistended Vascular: Right lower extremity:Thigh soft. Stump is healthy. Dressing is clean and dry   Laboratory: CBC    Component Value Date/Time   WBC 10.1 10/24/2019 0428   HGB 7.6 (L) 10/24/2019 0428   HCT 23.6 (L) 10/24/2019 0428   PLT 351 10/24/2019 0428   BMET    Component Value Date/Time   NA 144 10/24/2019 0428   NA 141 08/03/2011 0729   K 4.5 10/24/2019 0428   K 3.6 08/03/2011 0729   CL 111 10/24/2019 0428   CL 105 08/03/2011 0729   CO2 28 10/24/2019 0428   CO2 26 08/03/2011 0729   GLUCOSE 102 (H) 10/24/2019 0428   GLUCOSE 149 (H) 08/03/2011 0729   BUN 17 10/24/2019 0428   BUN 16 08/03/2011 0729   CREATININE 1.23 (H) 10/24/2019 0428   CREATININE 1.07 08/03/2011 0729   CALCIUM 8.5 (L) 10/24/2019 0428   CALCIUM 9.5 08/03/2011 0729   GFRNONAA 38 (L) 10/24/2019 0428   GFRNONAA 48 (L) 08/03/2011 0729   GFRAA 44 (L) 10/24/2019 0428   GFRAA 56 (L) 08/03/2011 0729   Assessment/Planning: The patient is a 86 year old female with chronic ischemia to the right lower extremity. Status post right above-the-knee amputation -10/10/19  1) CODE STATUS / HCPOA: Patient's family has provided the hospital with written formal DNR status. CODE STATUS updated in epic.  Family has also  submitted healthcare power of attorney which is her daughter.  2) Palliative care consult: Patient's family would like to have her discharged home with home services. They have agreed to outpatient palliative follow-up. Social work is currently working on home health services however due to insurance as well as home services staffing the patients home physical therapy would not start for least 4 to 5 days.  Unsure if patient would have home nursing services.  At this point, I am unsure if the family is aware that if she is taking home she will most likely passed within a few days.  Had meeting today with patient, family social worker and Dr. Delana Meyer - family has decided on hospice will plan on discharge tomorrow.   3) Pneumonia: Patient with shortness of breath today, increased oxygenation needs 3L Possible pneumonia on x-ray Started Vanco / Zosyn which I have asked pharmacy to help dose as the patient has chronic renal disease Will transition tomorrow to augmentin at discharge  Discussed with Dr. Eber Hong Jermany Rimel PA-C 10/24/2019 2:09 PM

## 2019-10-24 NOTE — TOC Progression Note (Signed)
Transition of Care [facility]) - Progression Note    Patient Details  Name: [name] MRN: [id number] Date of Birth: 1926-01-28  Transition of Care [facility]) CM/SW Contact  [name], RN Phone Number: 10/24/2019, 2:52 PM  Clinical Narrative:    RNCM met with. MD, PA, patient, son, and daughter.  After lengthy discussion patient has decided to return home with hospice services. Son and daughter supportive of this decision.  They would like [employer].  [name] with [employer] notified in change of status.    Plan for patient to discharge home tomorrow.  DNR on chart [name] with [employer] arranging hospital bed, and O2 for discharge.  Will require EMS transport    Expected Discharge Plan: Home w Hospice Care Barriers to Discharge: Continued Medical Work up  Expected Discharge Plan and Services Expected Discharge Plan: [place]   Discharge Planning Services: CM Consult   Living arrangements for the past 2 months: Single Family Home                                       Social Determinants of Health (SDOH) Interventions    Readmission Risk Interventions No flowsheet data found.

## 2019-10-25 LAB — GLUCOSE, CAPILLARY: Glucose-Capillary: 112 mg/dL — ABNORMAL HIGH (ref 70–99)

## 2019-10-25 LAB — CBC
HCT: 27 % — ABNORMAL LOW (ref 36.0–46.0)
Hemoglobin: 8.1 g/dL — ABNORMAL LOW (ref 12.0–15.0)
MCH: 28 pg (ref 26.0–34.0)
MCHC: 30 g/dL (ref 30.0–36.0)
MCV: 93.4 fL (ref 80.0–100.0)
Platelets: 384 10*3/uL (ref 150–400)
RBC: 2.89 MIL/uL — ABNORMAL LOW (ref 3.87–5.11)
RDW: 14.5 % (ref 11.5–15.5)
WBC: 14.9 10*3/uL — ABNORMAL HIGH (ref 4.0–10.5)
nRBC: 0 % (ref 0.0–0.2)

## 2019-10-25 LAB — BASIC METABOLIC PANEL
Anion gap: 6 (ref 5–15)
BUN: 17 mg/dL (ref 8–23)
CO2: 25 mmol/L (ref 22–32)
Calcium: 8.7 mg/dL — ABNORMAL LOW (ref 8.9–10.3)
Chloride: 110 mmol/L (ref 98–111)
Creatinine, Ser: 1.3 mg/dL — ABNORMAL HIGH (ref 0.44–1.00)
GFR calc Af Amer: 41 mL/min — ABNORMAL LOW (ref >60)
GFR calc non Af Amer: 36 mL/min — ABNORMAL LOW (ref >60)
Glucose, Bld: 119 mg/dL — ABNORMAL HIGH (ref 70–99)
Potassium: 4.9 mmol/L (ref 3.5–5.1)
Sodium: 141 mmol/L (ref 135–145)

## 2019-10-25 LAB — MAGNESIUM: Magnesium: 1.8 mg/dL (ref 1.7–2.4)

## 2019-10-25 MED ORDER — LORAZEPAM 2 MG/ML IJ SOLN
0.5000 mg | Freq: Four times a day (QID) | INTRAMUSCULAR | Status: DC | PRN
  Administered 2019-10-25: 0.5 mg via INTRAVENOUS
  Filled 2019-10-25: qty 1

## 2019-10-25 MED ORDER — FUROSEMIDE 10 MG/ML IJ SOLN
40.0000 mg | Freq: Once | INTRAMUSCULAR | Status: AC
  Administered 2019-10-25: 40 mg via INTRAVENOUS
  Filled 2019-10-25: qty 4

## 2019-10-25 MED ORDER — AMOXICILLIN-POT CLAVULANATE 875-125 MG PO TABS: 1.0000 | ORAL_TABLET | Freq: Two times a day (BID) | ORAL | 0 refills | Status: DC

## 2019-10-25 MED ORDER — AMOXICILLIN-POT CLAVULANATE 875-125 MG PO TABS
1.0000 | ORAL_TABLET | Freq: Two times a day (BID) | ORAL | Status: DC
  Administered 2019-10-25: 1 via ORAL
  Filled 2019-10-25: qty 1

## 2019-10-25 MED ORDER — OXYCODONE HCL 5 MG PO TABS: 5.0000 mg | ORAL_TABLET | ORAL | 0 refills | Status: DC | PRN

## 2019-10-25 NOTE — Care Management Important Message (Signed)
Important Message  Patient Details  Name: [name] MRN: [id number] Date of Birth: [birth year]   Medicare Important Message Given:  No  Patient discharged prior to arrival to unit to deliver concurrent Medicare IM.  Family has received notice previously and aware of right.   [name] 10/25/2019, 2:19 PM

## 2019-10-25 NOTE — Progress Notes (Signed)
Notified Dr. Lucky Cowboy regarding patient's increased respirations and anxiety. I asked if maintenance fluids could be stopped and medication for anxiety be ordered.  Appropriate orders were placed. Gave patient 0.5mg  of ativan IV. Will continue to monitor.  Christene Slates  10/25/2019  3:46 AM

## 2019-10-25 NOTE — Discharge Summary (Signed)
[facility]    Discharge Summary  Patient ID:  [name] MRN: [id number] DOB/AGE: 1925-12-13 86 y.o.  Admit date: 10/03/2019 Discharge date: 10/25/2019 Date of Surgery: 10/10/2019 Surgeon: Surgeon(s): Schnier, Dolores Lory, MD  Admission Diagnosis: Ischemia of right lower extremity [I99.8]  Discharge Diagnoses:  Ischemia of right lower extremity [I99.8]  Secondary Diagnoses: Past Medical History:  Diagnosis Date  . A-fib ([place])   . CHF (congestive heart failure) ([place])   . Diabetes mellitus without complication ([place])   . Hypertension    Procedure(s): 10/04/19: 1. Introduction catheter into right lower extremity 3rd order catheter placement with additional third order 2. Contrast injection right lower extremity for distal runoff  3. Percutaneous transluminal angioplasty to 4 mm with Lutonix drug-eluting balloon mid to distal popliteal to 4 mm   4. Percutaneous transluminal angioplasty to 3 mm right anterior tibial.  5. Percutaneous transluminal angioplasty to 2.5 mm right peroneal artery.              6.  Mechanical thrombectomy of the right SFA and popliteal using the penumbra CAT 6 device in association with 10 mg of TPA.              7.  Star close closure left common femoral arteriotomy  10/10/19: Right above-the-knee amputation  Discharged Condition: Guarded  HPI / Hospital Course:  [name] is a 86 year old female who presented with increasing pain of the right lower extremity. She has a long history of multiple interventions and multiple stents. Noninvasive studies as well as physical examination suggests occlusion of the SFA popliteal stent with subsequent ischemia and rest pain.  This suggests the patient is having limb threatening ischemia. The risks and benefits are reviewed all questions answered patient agrees to proceed.  On October 04, 2019 the patient  underwent:  1. Introduction catheter into right lower extremity 3rd order catheter placement with additional third order 2. Contrast injection right lower extremity for distal runoff  3. Percutaneous transluminal angioplasty to 4 mm with Lutonix drug-eluting balloon mid to distal popliteal to 4 mm   4. Percutaneous transluminal angioplasty to 3 mm right anterior tibial.  5. Percutaneous transluminal angioplasty to 2.5 mm right peroneal artery.              6.  Mechanical thrombectomy of the right SFA and popliteal using the penumbra CAT 6 device in association with 10 mg of TPA.              7.  Star close closure left common femoral arteriotomy  She tolerated the procedure well was to the ICU for Aggrastat infusion overnight.  Patient was transitioned to heparin drip the following a.m.  Patient was treated with heparin drip over the following 3 days without any significant in arterial perfusion to the extremity.  At that point, the patient had exhausted her endovascular option was not a candidate for bypass as there was no distal target.  At that point, the patient and her family wanted to move forward with amputation as the pain to her right lower extremity was significant.  On October 10, 2019 the patient underwent:  Right above-the-knee amputation  She tolerated the procedure well and was transferred to the surgical floor without issue.  During the remainder of the patient's stay, she received physical therapy, Occupational Therapy, nutrition consult and assistance with managing her blood glucose from diabetes coordinator.  Unfortunately, the patient showed signs of failure to thrive not eating even  after Megace was initiated.  Patient participated with physical therapy and Occupational Therapy at a minimum.  In an attempt to control her renal function the patient was on constant IV fluids as well as intermittent treatment for  hyperkalemia.  Throughout her hospitalization, the patient and her family were updated daily via vascular surgery team.  Discharge options presented to the patient and her family were dispo to skilled nursing facility, home with home services or hospice/palliative services.  The family chose discharge home with home services.  Multiple meetings in regard to the patient's prognosis were had with the family.  Due to the patient's failure to thrive, malnutrition her prognosis is poor.  Last meeting had with the patient and her family the patient was asking to go home with hospice.  Family also agreed.  Patient was discharged home with hospice/palliative care.  Physical exam:  A&Ox2, NAD CV: RRR Pulmonary: Slightly labored, decreased bilaterally Abdomen: Soft, Nontender, Nondistended Vascular: Right lower extremity:Thigh soft. Stump is healthy. Dressing is clean and dry  Labs: As below  Complications: None  Consults:  Physical therapy Occupational therapy Nutrition Diabetes coordinator Nephrology Social work Hospice and palliative medicine  Significant Diagnostic Studies: CBC Lab Results  Component Value Date   WBC 14.9 (H) 10/25/2019   HGB 8.1 (L) 10/25/2019   HCT 27.0 (L) 10/25/2019   MCV 93.4 10/25/2019   PLT 384 10/25/2019   BMET    Component Value Date/Time   NA 141 10/25/2019 0446   NA 141 08/03/2011 0729   K 4.9 10/25/2019 0446   K 3.6 08/03/2011 0729   CL 110 10/25/2019 0446   CL 105 08/03/2011 0729   CO2 25 10/25/2019 0446   CO2 26 08/03/2011 0729   GLUCOSE 119 (H) 10/25/2019 0446   GLUCOSE 149 (H) 08/03/2011 0729   BUN 17 10/25/2019 0446   BUN 16 08/03/2011 0729   CREATININE 1.30 (H) 10/25/2019 0446   CREATININE 1.07 08/03/2011 0729   CALCIUM 8.7 (L) 10/25/2019 0446   CALCIUM 9.5 08/03/2011 0729   GFRNONAA 36 (L) 10/25/2019 0446   GFRNONAA 48 (L) 08/03/2011 0729   GFRAA 41 (L) 10/25/2019 0446   GFRAA 56 (L) 08/03/2011 0729   COAG Lab  Results  Component Value Date   INR 1.1 10/10/2019   INR 1.2 10/03/2019   INR 1.1 03/01/2019   Disposition:  Discharge to :Home  Allergies as of 10/25/2019      Reactions   Sulfa Antibiotics Hives      Medication List    TAKE these medications   Accu-Chek Aviva Plus test strip Generic drug: glucose blood CHECK BLOOD SUGAR 3 TIMES DAILY   Accu-Chek Aviva Plus test strip Generic drug: glucose blood   acetaminophen 325 MG tablet Commonly known as: TYLENOL Take 325-650 mg by mouth every 6 (six) hours as needed for headache.   allopurinol 100 MG tablet Commonly known as: ZYLOPRIM Take 100 mg by mouth daily.   amoxicillin-clavulanate 875-125 MG tablet Commonly known as: AUGMENTIN Take 1 tablet by mouth every 12 (twelve) hours.   aspirin 81 MG EC tablet Take 1 tablet (81 mg total) by mouth daily.   carvedilol 3.125 MG tablet Commonly known as: COREG Take 3.125 mg by mouth 2 (two) times daily with a meal.   furosemide 20 MG tablet Commonly known as: LASIX Take 20 mg by mouth 2 (two) times daily.   gabapentin 100 MG capsule Commonly known as: NEURONTIN Take 100-200 mg by mouth at bedtime as needed (pain).  HumaLOG KwikPen 100 UNIT/ML KwikPen Generic drug: insulin lispro Inject 3 Units into the skin 3 (three) times daily after meals. Per sliding scale   Levemir FlexTouch 100 UNIT/ML FlexPen Generic drug: insulin detemir Inject 12 Units into the skin at bedtime.   levothyroxine 88 MCG tablet Commonly known as: SYNTHROID Take 88 mcg by mouth daily before breakfast.   lisinopril 5 MG tablet Commonly known as: ZESTRIL Take 5 mg by mouth daily.   lovastatin 20 MG tablet Commonly known as: MEVACOR Take 20 mg by mouth daily with supper.   meclizine 25 MG tablet Commonly known as: ANTIVERT Take 25 mg by mouth 3 (three) times daily as needed for dizziness.   oxyCODONE 5 MG immediate release tablet Commonly known as: Oxy IR/ROXICODONE Take 1-2 tablets (5-10 mg  total) by mouth every 4 (four) hours as needed for moderate pain.   potassium chloride SA 20 MEQ tablet Commonly known as: KLOR-CON Take 20 mEq by mouth daily.   PRESERVISION AREDS 2 PO Take 1 tablet by mouth every evening.   Vitamin D3 25 MCG tablet Commonly known as: Vitamin D Take 1,000 Units by mouth daily.      Verbal and written Discharge instructions given to the patient. Wound care per Discharge AVS  Contact information for follow-up providers    Schnier, Dolores Lory, MD. Go on 11/22/2019.   Specialties: Vascular Surgery, Cardiology, Radiology, Vascular Surgery Why: First post-op visit. No studies needed. 11:45 am appointment Contact information: [place] [address] [phone]            Contact information for after-discharge care    [facility] .   Service: Skilled Nursing Contact information: [place] [phone]                 Signed: Sela Hua, PA-C  10/25/2019, 11:42 AM

## 2019-10-25 NOTE — Progress Notes (Signed)
I called respiratory to assess patient due to increased work of breathing and restlessness. Ativan IV that was given did not help patient and respirations were in the 30's. Respiratory recommended 40 mg of lasix IV because patient has crackles in lung bases. Notified Dr. Lucky Cowboy and order was placed. Gave lasix and will continue to monitor patient.  [name]

## 2019-10-25 NOTE — Progress Notes (Signed)
[facility] hospital Liaison note:  Follow up visit made to new referral for [facility] hospice services at home. Patient seen sitting up in bed, appeared sleepy and some what dyspneic. Daughter [name] at bedside. Patient has just received tylenol per [name]. All DME is in place in the home. TOC [place] Scrape aware.  Signed out of facility DNR is in place for transport. Hospice admission visit is set for 3 pm at the home. Thank you.  Flo Shanks BSN, RN, [place] [phone]

## 2019-10-25 NOTE — Plan of Care (Signed)
Discharge order received. Patient mental status is at baseline. Vital signs stable . No signs of acute distress. Discharge instructions given to daughter. Daughter verbalized understanding. No other issues noted at this time. Transported via EMS. Home with hospice following .

## 2019-10-25 NOTE — TOC Transition Note (Signed)
Transition of Care [facility]) - CM/SW Discharge Note   Patient Details  Name: [name] MRN: [id number] Date of Birth: 28-Jan-1926  [facility] of [facility] CM/SW Contact:  Candie Chroman, LCSW Phone Number: 10/25/2019, 10:21 AM   Clinical Narrative: Patient has orders to discharge home with hospice today. Patient will go home by EMS and is third on the transport list. Daughter confirmed address on facesheet is correct. DME was delivered yesterday afternoon. No further concerns. CSW signing off.    Final next level of care: Home w Hospice Care Barriers to Discharge: Barriers Resolved   Patient Goals and CMS Choice     Choice offered to / list presented to : Adult Children  Discharge Placement                Patient to be transferred to facility by: EMS Name of family member notified: Driscilla Grammes Patient and family notified of of transfer: 10/25/19  Discharge Plan and Services   Discharge Planning Services: CM Consult            DME Arranged: Hospital bed, Oxygen DME Agency: Other - Comment (Arranged by [facility])                  Social Determinants of Health (SDOH) Interventions     Readmission Risk Interventions No flowsheet data found.

## 2019-10-25 NOTE — Discharge Instructions (Signed)
Vascular Surgery Discharge Instructions  1) You may shower.  Keep stump clean and dry. 2) You can leave your stump open to air.  If you notice any drainage please cover with ABD to the incision line, cover with Kerlix, cover with Ace. 3) Palliative will review your medication list and instruct to which medications to take or discontinue.

## 2019-10-31 NOTE — Interval H&P Note (Signed)
History and Physical Interval Note:  10/31/2019 1:20 PM  [name]  has presented today for surgery, with the diagnosis of ISCHEMIC RIGHT LEG.  The various methods of treatment have been discussed with the patient and family. After consideration of risks, benefits and other options for treatment, the patient has consented to  Procedure(s): AMPUTATION ABOVE KNEE (Right) as a surgical intervention.  The patient's history has been reviewed, patient examined, no change in status, stable for surgery.  I have reviewed the patient's chart and labs.  Questions were answered to the patient's satisfaction.     [name]

## 2019-11-22 ENCOUNTER — Encounter (INDEPENDENT_AMBULATORY_CARE_PROVIDER_SITE_OTHER): Payer: Medicare Other

## 2019-11-22 ENCOUNTER — Ambulatory Visit (INDEPENDENT_AMBULATORY_CARE_PROVIDER_SITE_OTHER): Payer: Medicare Other | Admitting: Vascular Surgery

## 2019-11-22 ENCOUNTER — Other Ambulatory Visit: Payer: Self-pay

## 2019-11-22 ENCOUNTER — Encounter (INDEPENDENT_AMBULATORY_CARE_PROVIDER_SITE_OTHER): Payer: Self-pay | Admitting: Vascular Surgery

## 2019-11-22 VITALS — BP 147/68 | HR 50 | Resp 16

## 2019-11-22 DIAGNOSIS — I998 Other disorder of circulatory system: Secondary | ICD-10-CM

## 2019-11-22 NOTE — Progress Notes (Signed)
Patient ID: [name], female   DOB: 1925/09/06, 86 y.o.   MRN: [id number]  Chief Complaint  Patient presents with  . Follow-up    [facility] 39month post procedure    HPI [name] is a 86 y.o. female.    She is noting phantom pain in her "toes" once every 2-3 days.  No fever or chills  appetite is much better   Past Medical History:  Diagnosis Date  . A-fib ([name])   . CHF (congestive heart failure) ([name])   . Diabetes mellitus without complication ([name])   . Hypertension     Past Surgical History:  Procedure Laterality Date  . AMPUTATION Right 10/10/2019   Procedure: AMPUTATION ABOVE KNEE;  Surgeon: Kathleen Cabal, MD;  Location: [facility] ORS;  Service: Vascular;  Laterality: Right;  . CHOLECYSTECTOMY    . LOWER EXTREMITY ANGIOGRAPHY Right 08/12/2017   Procedure: LOWER EXTREMITY ANGIOGRAPHY;  Surgeon: Kathleen Cabal, MD;  Location: [place] [facility];  Service: Cardiovascular;  Laterality: Right;  . LOWER EXTREMITY ANGIOGRAPHY Right 06/13/2018   Procedure: LOWER EXTREMITY ANGIOGRAPHY;  Surgeon: Kathleen Cabal, MD;  Location: [place] [facility];  Service: Cardiovascular;  Laterality: Right;  . LOWER EXTREMITY ANGIOGRAPHY Right 03/01/2019   Procedure: LOWER EXTREMITY ANGIOGRAPHY;  Surgeon: Kathleen Cabal, MD;  Location: [place] [facility];  Service: Cardiovascular;  Laterality: Right;  . LOWER EXTREMITY ANGIOGRAPHY Right 07/31/2019   Procedure: LOWER EXTREMITY ANGIOGRAPHY;  Surgeon: Kathleen Cabal, MD;  Location: [place] [facility];  Service: Cardiovascular;  Laterality: Right;  . LOWER EXTREMITY ANGIOGRAPHY Right 10/04/2019   Procedure: Lower Extremity Angiography;  Surgeon: Kathleen Cabal, MD;  Location: [place] [facility];  Service: Cardiovascular;  Laterality: Right;      Allergies  Allergen Reactions  . Sulfa Antibiotics Hives    Current Outpatient Medications  Medication Sig Dispense Refill  . ACCU-CHEK AVIVA PLUS test strip     .  acetaminophen (TYLENOL) 325 MG tablet Take 325-650 mg by mouth every 6 (six) hours as needed for headache.     . allopurinol (ZYLOPRIM) 100 MG tablet Take 100 mg by mouth daily.    . calcium carbonate (CALCIUM 600) 1500 (600 Ca) MG TABS tablet Take by mouth daily with breakfast.    . carvedilol (COREG) 3.125 MG tablet Take 3.125 mg by mouth 2 (two) times daily with a meal.     . clopidogrel (PLAVIX) 75 MG tablet Take 75 mg by mouth daily.    . furosemide (LASIX) 20 MG tablet Take 20 mg by mouth 2 (two) times daily.     Marland Kitchen gabapentin (NEURONTIN) 100 MG capsule Take 100-200 mg by mouth at bedtime as needed (pain).     Marland Kitchen HUMALOG KWIKPEN 100 UNIT/ML KiwkPen Inject 3 Units into the skin 3 (three) times daily after meals. Per sliding scale    . HYDROcodone-acetaminophen (NORCO/VICODIN) 5-325 MG tablet Take 1 tablet by mouth every 4 (four) hours as needed for moderate pain.    Marland Kitchen LEVEMIR FLEXTOUCH 100 UNIT/ML Pen Inject 12 Units into the skin at bedtime.     Marland Kitchen levothyroxine (SYNTHROID, LEVOTHROID) 88 MCG tablet Take 88 mcg by mouth daily before breakfast.     . lisinopril (ZESTRIL) 5 MG tablet Take 5 mg by mouth daily.     Marland Kitchen lovastatin (MEVACOR) 20 MG tablet Take 20 mg by mouth daily with supper.     . meclizine (ANTIVERT) 25 MG tablet Take 25 mg  by mouth 3 (three) times daily as needed for dizziness.     . mirtazapine (REMERON) 15 MG tablet Take by mouth.    . Multiple Vitamins-Minerals (PRESERVISION AREDS 2 PO) Take 1 tablet by mouth every evening.    . potassium chloride SA (K-DUR,KLOR-CON) 20 MEQ tablet Take 20 mEq by mouth daily.     . Vitamin D3 (VITAMIN D) 25 MCG tablet Take 1,000 Units by mouth daily.     Marland Kitchen amoxicillin-clavulanate (AUGMENTIN) 875-125 MG tablet Take 1 tablet by mouth every 12 (twelve) hours. (Patient not taking: Reported on 11/22/2019) 14 tablet 0  . aspirin EC 81 MG EC tablet Take 1 tablet (81 mg total) by mouth daily. (Patient not taking: Reported on 11/22/2019)    . glucose blood  (ACCU-CHEK AVIVA PLUS) test strip CHECK BLOOD SUGAR 3 TIMES DAILY    . oxyCODONE (OXY IR/ROXICODONE) 5 MG immediate release tablet Take 1-2 tablets (5-10 mg total) by mouth every 4 (four) hours as needed for moderate pain. (Patient not taking: Reported on 11/22/2019) 50 tablet 0   No current facility-administered medications for this visit.        Physical Exam BP (!) 147/68 (BP Location: Right Arm)   Pulse (!) 50   Resp 16  Gen:  WD/WN, NAD Skin: incision C/D/I right AKA     Assessment/Plan: 1. Ischemia of right lower extremity Patient is doing well would appears well healed Follow up in 6 months      [name] 11/22/2019, 12:22 PM   This note was created with Dragon medical transcription system.  Any errors from dictation are unintentional.

## 2020-01-22 ENCOUNTER — Telehealth: Payer: Self-pay | Admitting: Primary Care

## 2020-01-22 NOTE — Telephone Encounter (Signed)
Called patient's daughter, [name], to offer to schedule the Palliative Consult and spoke with her husband.  He stated that she had left her phone at home and he would give her the message to call me back.

## 2020-01-23 ENCOUNTER — Telehealth: Payer: Self-pay | Admitting: Primary Care

## 2020-01-23 NOTE — Telephone Encounter (Signed)
Rc'd return call from patient's daughter, [name], regarding scheduling a Palliative visit with NP.  I have scheduled an In-person Consult for 01/24/20 @ 3 PM.

## 2020-01-24 ENCOUNTER — Other Ambulatory Visit: Payer: Self-pay

## 2020-01-24 ENCOUNTER — Other Ambulatory Visit: Payer: Medicare Other | Admitting: Primary Care

## 2020-01-24 DIAGNOSIS — Z515 Encounter for palliative care: Secondary | ICD-10-CM

## 2020-01-24 DIAGNOSIS — E1151 Type 2 diabetes mellitus with diabetic peripheral angiopathy without gangrene: Secondary | ICD-10-CM

## 2020-01-24 DIAGNOSIS — Z794 Long term (current) use of insulin: Secondary | ICD-10-CM

## 2020-01-24 NOTE — Progress Notes (Signed)
[facility] Consult Note Telephone: [phone]  Fax: [phone]  PATIENT NAME: [name] [address] [place] [address] [phone] (home)  DOB: Oct 25, 1925 MRN: [id number]  PRIMARY CARE PROVIDER:    Sofie Hartigan, MD,  [place] [address] [phone]  REFERRING PROVIDER:   Sofie Hartigan, [place],  [place] [address] [phone]  RESPONSIBLE PARTY:   Extended Emergency Contact Information Primary Emergency Contact: Daily III,John Home Phone: [phone] Mobile Phone: [phone] Relation: Son Secondary Emergency Contact: Fernanda Drum Mobile Phone: [phone] Relation: Daughter  I met face to face with patient and family in the home. I met with [name] and her family in her home. She is recently discharged off of hospice for stability. She had her right leg amputated a.k.a. in July and at that point went on to hospice services. Since that time she has improved with her intake her function and her PPS.   ASSESSMENT AND RECOMMENDATIONS:   1. Advance Care Planning/Goals of Care: Goals include to maximize quality of life and symptom management. Our advance care planning conversation included a discussion about:     The value and importance of advance care planning   Experiences with loved ones who have been seriously ill or have died   Exploration of personal, cultural or spiritual beliefs that might influence medical decisions   Exploration of goals of care in the event of a sudden injury or illness   Review and  of an  advance directive document .   Goals of care we discussed again her MOST form and her DNR which are in the home. We reviewed and she does not want to make any changes but we did document a review. She has requested DNR, limited scope ,trial use of antibiotics and IVs, and no feeding to. We discussed her hospitalization in July and various outcomes  vis--vis her quality of life and goals for her care. Family is unified in honoring these wishes and helping her have maximum quality of life.   2. Symptom Management:   Self care: Even though she does not have a prosthesis she is able to transfer herself to her wheelchair and performs all her ADLs independently, with a PPS at 60%. She also checks her blood sugars prior to meals.   Diabetic control management she is taking eight units of levemir insulin nightly and three units of Humalog at each meal. She does check her sugars,  although they are frequently postprandial,  and records them. We discussed that while she has not had any overt hypoglycemic episodes she is very tightly controlled and may not need to be that tightly controlled. I referred her back to Ms. Blackwood her endocrinology NP to assess for managing her, perhaps just on the basal and D/C meal time checks and insulin as a quality of life measure.  She states her circulation is good to her left leg as she was followed with vascular up until July .  Nutrition She has a heart healthy diet and her sugars have been very reasonable, usually 100 to 200 with some outliers.  No A1C found on file.She eat three meals a day, small portions,  but consistently good. Her latest albumin was in normal limits. She does not endorse any feeding or appetite problems and states she does work hard to avoid foods that will raise her blood sugar.  Fall risk: due to her R leg  amputation and plans not to get a prosthesis she may be at increased risk of falling. She also endorses infrequently having every 3 to 4 weeks spells whereby she feels faint. She states that she is not dizzy , then  it passes without her actually fainting. I encouraged her to increase her volume of water; currently she said she only drinks about a quart a day. I encouraged her to double this and provided education that while she may not feel thirsty, its still important for her to drink  water.   Medication review: We reviewed all of her medication she seems to be compliant we discussed her narcotic use as she is mainly using it for phantom pain. I encouraged her to try the gabapentin consistently and perhaps be able to omit the narcotic. Now that she is off of hospice services I did discuss the laws for managing narcotic use. I will refer back to her primary for ongoing needs. Her daughter states she only uses 5 mg perhaps three times a week.I encouraged her also to use acetaminophen as needed for pain control and if the phantom pains continue to be bothersome, I would ask her primary provider to refer her to physical therapy to work on modalities such as mirror therapy.   3. Follow up Palliative Care Visit: Palliative care will continue to follow for goals of care clarification and symptom management. Return 7 weeks or prn.  4. Family /Caregiver/Community Supports: Lives in own home with family support.  5. Cognitive / Functional decline: A and O x 3, good historian. Needs assistance with many adls, iads. Able to shower and dress self.  I spent 75 minutes providing this consultation,  from 1600 to 1715. More than 50% of the time in this consultation was spent coordinating communication.   CHIEF COMPLAINT: glucose regulation, goals of care discussion  HISTORY OF PRESENT ILLNESS:  [name] is a 86 y.o. year old female  with recent d/c from hospice in 7/21 she rad a Right leg aka due to PAD and poor perfusion. She is now trying to perfotm many adls from her w/c. Family had concerns about who to call if needed on short notice, since hospice d/c. We are asked to consult around goals of care, advance care plans,and glucose management   Palliative Care was asked to follow this patient by consultation request of Feldpausch, Chrissie Noa, MD to help address advance care planning and goals of care. This is the initial  visit.  CODE STATUS: DNR  PPS: 50%  HOSPICE ELIGIBILITY/DIAGNOSIS:  TBD  ROS  General: NAD EYES: denies vision changes, wears glasses ENMT: denies dysphagia Cardiovascular: denies chest pain Pulmonary: denies  cough, denies increased SOB,  Abdomen: endorses fair appetite, denies constipation, endorses continence of bowel GU: denies dysuria, endorses continence of urine MSK:  endorses ROM limitations, no falls reported Skin: denies rashes or wounds Neurological: endorses weakness, endorses phantom limb  pain, denies insomnia Psych: Endorses positive mood d Physical Exam: Current and past weights: stable Constitutional: 123/78 HR 56 RR 20 General :frail appearing, thin EYES: anicteric sclera,EOMI, lids intact, no discharge  ENMT: intact hearing,oral mucous membranes moist, dentition intact  CV: S1S2, RRR, no LE edema Pulmonary: LCTA, no increased work of breathing, no cough, no audible wheezes, room air Abdomen: intake 25-50%, normo-active BS +  4 quadrants, soft and non tender, no ascites GU: deferred MSK: mild sacropenia, decreased ROM in all extremities, no contractures of LE, non ambulatory, Right AKA. Skin: warm and  dry, no rashes or wounds on visible skin Neuro: Weakness,  grossly non -focal Psych: non -anxious affect, A and O x 3    CURRENT PROBLEM LIST:  Patient Active Problem List   Diagnosis Date Noted   Palliative care by specialist    DNR (do not resuscitate)    Advanced directive placed in chart this admission    Advanced directives, counseling/discussion    Weakness    Goals of care, counseling/discussion    Protein-calorie malnutrition, severe 10/12/2019   Ischemia of right lower extremity 10/03/2019   Ischemia of extremity 07/31/2019   Proteinuria 04/23/2019   Atherosclerosis of native arteries of the extremities with ulceration ([facility]) 03/01/2019   Chronic vertebral fracture due to osteoporosis ([facility]) 05/20/2018   Closed T10 fracture ([facility]) 05/20/2018   Gout 03/27/2018   Hypothyroidism 03/27/2018    Subdural hematoma ([facility]) 03/08/2018   Abnormal SPEP 12/07/2017   History of vitamin D deficiency 11/30/2017   Hyperlipidemia associated with type 2 diabetes mellitus ([facility]) 11/30/2017   PAD (peripheral artery disease) ([facility]) 07/19/2017   Leg pain, lateral, left 07/19/2017   Diabetes ([facility]) 07/19/2017   Hyperlipidemia 07/19/2017   Essential hypertension 07/19/2017   Mild aortic valve stenosis 06/22/2017   Persistent atrial fibrillation ([facility]) 06/22/2017   Moderate mitral insufficiency 10/30/2015   Chronic kidney disease, stage 3 unspecified ([facility]) 09/19/2014   Type II or unspecified type diabetes mellitus with renal manifestations, not stated as uncontrolled(250.40) 11/08/2013   Vitamin D deficiency 11/08/2013   PAST MEDICAL HISTORY:  Past Medical History:  Diagnosis Date   A-fib ([facility])    CHF (congestive heart failure) ([facility])    Diabetes mellitus without complication ([facility])    Hypertension     SOCIAL HX:  Social History   Tobacco Use   Smoking status: Never Smoker   Smokeless tobacco: Never Used  Substance Use Topics   Alcohol use: No   FAMILY HX:  Family History  Problem Relation Age of Onset   Cancer Brother     ALLERGIES:  Allergies  Allergen Reactions   Sulfa Antibiotics Hives     PERTINENT MEDICATIONS:  Outpatient Encounter Medications as of 01/24/2020  Medication Sig   ACCU-CHEK AVIVA PLUS test strip    acetaminophen (TYLENOL) 325 MG tablet Take 325-650 mg by mouth every 6 (six) hours as needed for headache.    allopurinol (ZYLOPRIM) 100 MG tablet Take 100 mg by mouth daily.   aspirin EC 81 MG EC tablet Take 1 tablet (81 mg total) by mouth daily.   calcium carbonate (CALCIUM 600) 1500 (600 Ca) MG TABS tablet Take by mouth daily with breakfast.   carvedilol (COREG) 6.25 MG tablet Take 6.25 mg by mouth 2 (two) times daily with a meal.    clopidogrel (PLAVIX) 75 MG tablet Take 75 mg by mouth daily.   furosemide (LASIX) 20 MG tablet Take  20 mg by mouth 2 (two) times daily.    gabapentin (NEURONTIN) 100 MG capsule Take 100-200 mg by mouth at bedtime as needed (pain).    glucose blood (ACCU-CHEK AVIVA PLUS) test strip CHECK BLOOD SUGAR 3 TIMES DAILY   HUMALOG KWIKPEN 100 UNIT/ML KiwkPen Inject 3 Units into the skin 3 (three) times daily after meals. Per sliding scale   HYDROcodone-acetaminophen (NORCO/VICODIN) 5-325 MG tablet Take 1 tablet by mouth every 4 (four) hours as needed for moderate pain.   LEVEMIR FLEXTOUCH 100 UNIT/ML Pen Inject 8 Units into the skin at bedtime.    levothyroxine (SYNTHROID, LEVOTHROID) 88 MCG  tablet Take 88 mcg by mouth daily before breakfast.    lisinopril (ZESTRIL) 5 MG tablet Take 5 mg by mouth daily.    lovastatin (MEVACOR) 20 MG tablet Take 20 mg by mouth daily with supper.    meclizine (ANTIVERT) 25 MG tablet Take 25 mg by mouth 3 (three) times daily as needed for dizziness.    mirtazapine (REMERON) 15 MG tablet Take by mouth.   Multiple Vitamins-Minerals (PRESERVISION AREDS 2 PO) Take 1 tablet by mouth every evening.   oxyCODONE (OXY IR/ROXICODONE) 5 MG immediate release tablet Take 1-2 tablets (5-10 mg total) by mouth every 4 (four) hours as needed for moderate pain.   Vitamin D3 (VITAMIN D) 25 MCG tablet Take 1,000 Units by mouth daily.    potassium chloride SA (K-DUR,KLOR-CON) 20 MEQ tablet Take 20 mEq by mouth daily.  (Patient not taking: Reported on 01/24/2020)   [DISCONTINUED] amoxicillin-clavulanate (AUGMENTIN) 875-125 MG tablet Take 1 tablet by mouth every 12 (twelve) hours. (Patient not taking: Reported on 11/22/2019)   No facility-administered encounter medications on file as of 01/24/2020.     Jason Coop, NP , DNP, MPH, AGPCNP-BC, ACHPN  COVID-19 PATIENT SCREENING TOOL  Person answering questions: ____________self______ _____   1.  Is the patient or any family member in the home showing any signs or symptoms regarding respiratory infection?                Person with Symptom- __________NA_________________  a. Fever                                                                          Yes___ No___          ___________________  b. Shortness of breath                                                    Yes___ No___          ___________________ c. Cough/congestion                                       Yes___  No___         ___________________ d. Body aches/pains                                                         Yes___ No___        ____________________ e. Gastrointestinal symptoms (diarrhea, nausea)           Yes___ No___        ____________________  2. Within the past 14 days, has anyone living in the home had any contact with someone with or under investigation for COVID-19?    Yes___ No_X_   Person __________________

## 2020-03-10 ENCOUNTER — Other Ambulatory Visit: Payer: Self-pay

## 2020-03-10 ENCOUNTER — Other Ambulatory Visit: Payer: Medicare Other | Admitting: Primary Care

## 2020-03-10 DIAGNOSIS — M79605 Pain in left leg: Secondary | ICD-10-CM

## 2020-03-10 DIAGNOSIS — Z515 Encounter for palliative care: Secondary | ICD-10-CM

## 2020-03-10 DIAGNOSIS — I739 Peripheral vascular disease, unspecified: Secondary | ICD-10-CM

## 2020-03-10 DIAGNOSIS — E1151 Type 2 diabetes mellitus with diabetic peripheral angiopathy without gangrene: Secondary | ICD-10-CM

## 2020-03-10 DIAGNOSIS — Z794 Long term (current) use of insulin: Secondary | ICD-10-CM

## 2020-03-10 NOTE — Progress Notes (Signed)
[facility] Palliative Care Consult Note Telephone: [phone]  Fax: [phone]     Date of encounter: 03/10/20 PATIENT NAME: [name] [address] [place] [address] [phone] (home)  DOB: [birth year] MRN: [id number]  PRIMARY CARE PROVIDER:    Sofie Hartigan, MD,  [place] [address] [phone]  REFERRING PROVIDER:   Sofie Hartigan, MD [place],  [place] [address] [phone]  RESPONSIBLE PARTY:   Extended Emergency Contact Information Primary Emergency Contact: [name],Kathleen Home Phone: [phone] Mobile Phone: [phone] Relation: Son Secondary Emergency Contact: [name] Mobile Phone: [phone] Relation: Daughter  I met face to face with patient and family in  Her home. Palliative Care was asked to follow this patient by consultation request of Carlson, Kathleen Noa, MD to help address advance care planning and goals of care. This is a follow up  visit.   ASSESSMENT AND RECOMMENDATIONS:   1. Advance Care Planning/Goals of Care: Goals include to maximize quality of life and symptom management. Our advance care planning conversation included a discussion about:     The value and importance of advance care planning   Exploration of goals of care in the event of a sudden injury or illness   Identification and preparation of a healthcare agent   Review of an  advance directive document .  Is at her daughter's home and thinks she has [facility] in her files. We reviewed choices with new family member care giver [name].  2. Symptom Management:   Pain: Endorses ongoing and debilitating phantom pains. I have ordered 100 mg gabapentin tid  B/c she was taking prn and sporadically for hs pain. She endorses it ATC. Also does not want to take controlled meds. We discussed the ongoing nature of the neuropathies due to DM and she understands treating chronic pain pro actively.  Covid  prevention: Seeks booster which was due in August. Name of [name] pharmacy given for home visit for booster.  Nutrition: States good compliance and checks glucose at meals. Random bg was 175 mg/ dl a few weeks ago. Has made appt for f/u with endocrine NP.  3. Follow up Palliative Care Visit: Palliative care will continue to follow for goals of care clarification and symptom management. Return 4 weeks or prn.  4. Family /Caregiver/Community Supports: Own home being renovated, staying in daughter's apt. Has family members helping with care.  5. 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  I spent 60 minutes providing this consultation,  from 1600 to 1700. More than 50% of the time in this consultation was spent coordinating communication.   CODE STATUS: DNR  PPS: 50%  HOSPICE ELIGIBILITY/DIAGNOSIS: no  Subjective:  CHIEF COMPLAINT: phantom pain  HISTORY OF PRESENT ILLNESS:  [name] is a 86 y.o. 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 Endorse phantom R and neuropathic L leg pain, intermittant and 7-8/10 . Impacts her sleep, keeping her awake. Denies  relief from prn gabapentin. Does not want to take narcotics.  We are asked to consult around ACP and sx management.   History obtained from review of EMR, discussion with primary team, and  interview with family, caregiver  and/or [name]. Records reviewed and summarized above.   Review or lab tests: recent glucose = 175. Review of case with family member  granddaughter [name] who is in attendance.  CURRENT PROBLEM LIST:  Patient Active Problem List   Diagnosis Date Noted  . Palliative care by specialist   . DNR (do not resuscitate)   . Advanced directive placed in chart this admission   . Advanced directives, counseling/discussion   . Weakness   . Goals of care, counseling/discussion   . Protein-calorie malnutrition, severe  10/12/2019  . Ischemia of right lower extremity 10/03/2019  . Ischemia of extremity 07/31/2019  . Proteinuria 04/23/2019  . Atherosclerosis of native arteries of the extremities with ulceration ([place]) 03/01/2019  . Chronic vertebral fracture due to osteoporosis ([place]) 05/20/2018  . Closed T10 fracture ([place]) 05/20/2018  . Gout 03/27/2018  . Hypothyroidism 03/27/2018  . Subdural hematoma ([place]) 03/08/2018  . Abnormal SPEP 12/07/2017  . History of vitamin D deficiency 11/30/2017  . Hyperlipidemia associated with type 2 diabetes mellitus ([place]) 11/30/2017  . PAD (peripheral artery disease) ([place]) 07/19/2017  . Leg pain, lateral, left 07/19/2017  . Diabetes ([place]) 07/19/2017  . Hyperlipidemia 07/19/2017  . Essential hypertension 07/19/2017  . Mild aortic valve stenosis 06/22/2017  . Persistent atrial fibrillation ([place]) 06/22/2017  . Moderate mitral insufficiency 10/30/2015  . Chronic kidney disease, stage 3 unspecified ([place]) 09/19/2014  . Type II or unspecified type diabetes mellitus with renal manifestations, not stated as uncontrolled(250.40) 11/08/2013  . Vitamin D deficiency 11/08/2013   PAST MEDICAL HISTORY:  Active Ambulatory Problems    Diagnosis Date Noted  . PAD (peripheral artery disease) ([place]) 07/19/2017  . Leg pain, lateral, left 07/19/2017  . Diabetes ([place]) 07/19/2017  . Hyperlipidemia 07/19/2017  . Essential hypertension 07/19/2017  . Abnormal SPEP 12/07/2017  . Atherosclerosis of native arteries of the extremities with ulceration ([place]) 03/01/2019  . Chronic kidney disease, stage 3 unspecified ([place]) 09/19/2014  . Chronic vertebral fracture due to osteoporosis ([place]) 05/20/2018  . Closed T10 fracture ([place]) 05/20/2018  . Gout 03/27/2018  . History of vitamin D deficiency 11/30/2017  . Hyperlipidemia associated with type 2 diabetes mellitus ([place]) 11/30/2017  . Hypothyroidism 03/27/2018  . Mild aortic valve stenosis 06/22/2017  . Moderate mitral insufficiency 10/30/2015  .  Persistent atrial fibrillation ([place]) 06/22/2017  . Proteinuria 04/23/2019  . Subdural hematoma ([place]) 03/08/2018  . Type II or unspecified type diabetes mellitus with renal manifestations, not stated as uncontrolled(250.40) 11/08/2013  . Vitamin D deficiency 11/08/2013  . Ischemia of extremity 07/31/2019  . Ischemia of right lower extremity 10/03/2019  . Protein-calorie malnutrition, severe 10/12/2019  . Palliative care by specialist   . DNR (do not resuscitate)   . Advanced directive placed in chart this admission   . Advanced directives, counseling/discussion   . Weakness   . Goals of care, counseling/discussion    Resolved Ambulatory Problems    Diagnosis Date Noted  . No Resolved Ambulatory Problems   Past Medical History:  Diagnosis Date  . A-fib ([place])   . CHF (congestive heart failure) ([place])   . Diabetes mellitus without complication ([place])   . Hypertension    SOCIAL HX:  Social History   Tobacco Use  . Smoking status: Never Smoker  . Smokeless tobacco: Never Used  Substance Use Topics  . Alcohol use: No   FAMILY HX:  Family History  Problem Relation Age of Onset  . Cancer Brother       ALLERGIES:  Allergies  Allergen Reactions  . Sulfa Antibiotics Hives     PERTINENT MEDICATIONS:  Outpatient Encounter Medications as of 03/10/2020  Medication Sig  .  ACCU-CHEK AVIVA PLUS test strip   . acetaminophen (TYLENOL) 325 MG tablet Take 325-650 mg by mouth every 6 (six) hours as needed for headache.   . clopidogrel (PLAVIX) 75 MG tablet Take 75 mg by mouth daily.  Marland Kitchen gabapentin (NEURONTIN) 100 MG capsule Take 100-200 mg by mouth at bedtime as needed (pain).   Marland Kitchen gabapentin (NEURONTIN) 100 MG capsule Take 100 mg by mouth 3 (three) times daily.  Marland Kitchen LEVEMIR FLEXTOUCH 100 UNIT/ML Pen Inject 8 Units into the skin at bedtime.   Marland Kitchen levothyroxine (SYNTHROID, LEVOTHROID) 88 MCG tablet Take 88 mcg by mouth daily before breakfast.   . allopurinol (ZYLOPRIM) 100 MG tablet Take 100  mg by mouth daily.  Marland Kitchen aspirin EC 81 MG EC tablet Take 1 tablet (81 mg total) by mouth daily.  . calcium carbonate (CALCIUM 600) 1500 (600 Ca) MG TABS tablet Take by mouth daily with breakfast.  . carvedilol (COREG) 6.25 MG tablet Take 6.25 mg by mouth 2 (two) times daily with a meal.   . furosemide (LASIX) 20 MG tablet Take 20 mg by mouth 2 (two) times daily.   Marland Kitchen glucose blood (ACCU-CHEK AVIVA PLUS) test strip CHECK BLOOD SUGAR 3 TIMES DAILY  . HUMALOG KWIKPEN 100 UNIT/ML KiwkPen Inject 3 Units into the skin 3 (three) times daily after meals. Per sliding scale  . HYDROcodone-acetaminophen (NORCO/VICODIN) 5-325 MG tablet Take 1 tablet by mouth every 4 (four) hours as needed for moderate pain. (Patient not taking: Reported on 03/10/2020)  . lisinopril (ZESTRIL) 5 MG tablet Take 5 mg by mouth daily.   Marland Kitchen LORazepam (ATIVAN) 0.5 MG tablet Take 0.5 mg by mouth every 6 (six) hours as needed for anxiety.  . lovastatin (MEVACOR) 20 MG tablet Take 20 mg by mouth daily with supper.   . meclizine (ANTIVERT) 25 MG tablet Take 25 mg by mouth 3 (three) times daily as needed for dizziness.  (Patient not taking: Reported on 03/10/2020)  . mirtazapine (REMERON) 15 MG tablet Take by mouth.  . Multiple Vitamins-Minerals (PRESERVISION AREDS 2 PO) Take 1 tablet by mouth every evening.  Marland Kitchen oxyCODONE (OXY IR/ROXICODONE) 5 MG immediate release tablet Take 1-2 tablets (5-10 mg total) by mouth every 4 (four) hours as needed for moderate pain. (Patient not taking: Reported on 03/10/2020)  . potassium chloride SA (K-DUR,KLOR-CON) 20 MEQ tablet Take 20 mEq by mouth daily.  (Patient not taking: Reported on 01/24/2020)  . Vitamin D3 (VITAMIN D) 25 MCG tablet Take 1,000 Units by mouth daily.    No facility-administered encounter medications on file as of 03/10/2020.     Objective: ROS   General: NAD EYES: denies vision changes, wears glasses ENMT: endorses hearing loss, denies dysphagia Cardiovascular: denies chest  pain Pulmonary: denies  cough, denies increased SOB  Abdomen: endorses good  appetite, denies constipation, endorses continence of bowel GU: denies dysuria, endorses continence of urine MSK:  endorses ROM limitations, no falls reported Skin: denies rashes or wounds Neurological: endorses weakness, denies pain, denies insomnia Psych: Endorses positive mood Heme/lymph/immuno: denies bruises, abnormal bleeding  Physical Exam: Current and past weights: unavailable Constitutional:  NAD General :frail appearing, thin EYES: anicteric sclera,lids intact, no discharge  ENMT: deficits in hearing,oral mucous membranes moist CV:  no LE edema Pulmonary:  no increased work of breathing, no cough, no audible wheezes, room air Abdomen: intake 100%, no ascites GU: deferred MSK: severe sacropenia, decreased ROM in all extremities,R AKA,  non ambulatory Skin: warm and dry, no rashes or wounds on visible  skin Neuro:  no cognitive impairment Psych: non -anxious affect, A and O x 3 Hem/lymph/immuno: no widespread bruising, has bruising on arms and hands   Thank you for the opportunity to participate in the care of Ms. Tillett.  The palliative care team will continue to follow. Please call our office at [phone] if we can be of additional assistance.  Jason Coop, NP , DNP, MPH, AGPCNP-BC, ACHPN  COVID-19 PATIENT SCREENING TOOL  Person answering questions: ____________self______ _____   1.  Is the patient or any family member in the home showing any signs or symptoms regarding respiratory infection?               Person with Symptom- __________NA_________________  a. Fever                                                                          Yes___ No___          ___________________  b. Shortness of breath                                                    Yes___ No___          ___________________ c. Cough/congestion                                       Yes___  No___          ___________________ d. Body aches/pains                                                         Yes___ No___        ____________________ e. Gastrointestinal symptoms (diarrhea, nausea)           Yes___ No___        ____________________  2. Within the past 14 days, has anyone living in the home had any contact with someone with or under investigation for COVID-19?    Yes___ No_X_   Person __________________

## 2020-03-11 ENCOUNTER — Other Ambulatory Visit: Payer: Medicare Other | Admitting: Primary Care

## 2020-04-07 ENCOUNTER — Other Ambulatory Visit (INDEPENDENT_AMBULATORY_CARE_PROVIDER_SITE_OTHER): Payer: Self-pay | Admitting: Vascular Surgery

## 2020-04-08 ENCOUNTER — Other Ambulatory Visit: Payer: Medicare Other | Admitting: Primary Care

## 2020-04-11 ENCOUNTER — Other Ambulatory Visit: Payer: Medicare Other | Admitting: Primary Care

## 2020-04-11 ENCOUNTER — Encounter: Payer: Self-pay | Admitting: Primary Care

## 2020-04-11 ENCOUNTER — Other Ambulatory Visit: Payer: Self-pay

## 2020-04-11 DIAGNOSIS — E43 Unspecified severe protein-calorie malnutrition: Secondary | ICD-10-CM

## 2020-04-11 DIAGNOSIS — M79605 Pain in left leg: Secondary | ICD-10-CM

## 2020-04-11 DIAGNOSIS — E1151 Type 2 diabetes mellitus with diabetic peripheral angiopathy without gangrene: Secondary | ICD-10-CM

## 2020-04-11 NOTE — Progress Notes (Signed)
[facility] Palliative Care Consult Note Telephone: [phone]  Fax: [phone]     Date of encounter: 04/11/20 PATIENT NAME: Carisma Troupe Carrington [address] [place] [address] [phone] (home)  DOB: Jan 03, 1981 MRN: [id number]  PRIMARY CARE PROVIDER:    Sofie Hartigan, MD,  [place] [address] [phone]  REFERRING PROVIDER:   Sofie Hartigan, MD [place],  [place] [address] [phone]  RESPONSIBLE PARTY:   Extended Emergency Contact Information Primary Emergency Contact: Glanzer III,John Home Phone: [phone] Mobile Phone: [phone] Relation: Son Secondary Emergency Contact: Fernanda Drum Mobile Phone: [phone] Relation: Daughter  I met face to face with patient and family in  home. Palliative Care was asked to follow this patient by consultation request of Feldpausch, Chrissie Noa, MD to help address advance care planning and goals of care. This is a follow up  visit.  This is a face to face visit to assess DME needs.  Patient needs self propelling wheel chair for mobility, and due to R AKA  (Z89.611) and Type 2 diabetes mellitus with diabetic neuropathy, unspecified. E11. 40.  Needs self propelling w/c with elevating leg rest.  Ht 62 inches, wt 109 lbs. Has an old one that is outdated and has no leg rests. Has never received a chair from her benefit.  Non ambulatory due to R AKA and no plans for prosthesis. Can pivot transfer from chair to chair or bed. Therefore mobility limitations cannot be rectified by a walker.  Home environment is 1 level, ground, and has adequate passage for w/c.   The beneficiary has a mobility limitation that significantly impairs her ability to participate in one or more mobility-related activities of daily living (MRADLs) such as toileting, feeding, dressing, grooming, and bathing in customary. Having w/c will allow her to toilet, bathe and dine.  Patient is able  and willing to use self propelling w/c and does have a caregiver if needed later.   Elevating Leg rest need on Left for elevation of LE for ischemia (I99.9)   ASSESSMENT AND RECOMMENDATIONS:   1. Advance Care Planning/Goals of Care: Goals include to maximize quality of life and symptom management. No changes in ACP from last visit, requests DNR, limited scope, determine ABX  And IV need,no feeding tube.  2. Symptom Management:   Phantom pain: endorses ongoing  Recommend PT referral for dealing with phantom pain. Recommend trial of pregabalin for ongoing pain.Reports 10/10 leg pain on occasion at hs. Also has narcotics but eschews to use them.  Recommend 50 mg gabapentin at hs nightly for ongoing pain management. 100 mg was too much per her report and left her with am grogginess.  Left pretibial Bruise : along vein line, swollen and bluish but not warm to touch. Endorses some pain on palpation but does not wince. I believe it is a bruised area but have instructed if it worsens,becomes more tender, streaks, etc  in next 24-48 hours to seek Urgent care assessment.  Mobility:  Using wc she borrowed, old model without foot rests. Recommend elevating foot rest for L leg for in home elevation and for transport going out.  I will send order for w/c based on today's F2F.  3. Follow up Palliative Care Visit: Palliative care will continue to follow for goals of care clarification and symptom management. Return 4 weeks or prn.  4. Family /Caregiver/Community Supports:  Lives with daughter who is in poor health.  Does not get  MOW, daughter is cooking.  Family assists with care.  5. Cognitive / Functional decline:  A and O x 3. Needs assistance with most alds, iadls.Non ambulatory due to RAKA.  I spent 60 minutes providing this consultation,  from 1400 to 1500. More than 50% of the time in this consultation was spent coordinating communication.   CODE STATUS:DNR  PPS: 40%  HOSPICE  ELIGIBILITY/DIAGNOSIS: TBD  Subjective:  CHIEF COMPLAINT: pain, immobility  HISTORY OF PRESENT ILLNESS:  Lorina Duffner Delgrande is a 86 y.o. year old female  with DM, pvd, R aka, Peripheral neuropathy with phantom pain. She endorses continued hs pain in R stump often 10/10. Medications do help (gaba, narcotics) but she does not like the side effects of drowsiness .   We are asked to consult around advance care planning and complex medical decision making.    Review and summarization of old Epic records shows or history from other than patient . Review of case with family member daughter in home.  History obtained from review of EMR, discussion with primary team, and  interview with family, caregiver  and/or Ms. Cassidy. Records reviewed and summarized above.   CURRENT PROBLEM LIST:  Patient Active Problem List   Diagnosis Date Noted  . Palliative care by specialist   . DNR (do not resuscitate)   . Advanced directive placed in chart this admission   . Advanced directives, counseling/discussion   . Weakness   . Goals of care, counseling/discussion   . Protein-calorie malnutrition, severe 10/12/2019  . Ischemia of right lower extremity 10/03/2019  . Ischemia of extremity 07/31/2019  . Proteinuria 04/23/2019  . Atherosclerosis of native arteries of the extremities with ulceration ([place]) 03/01/2019  . Chronic vertebral fracture due to osteoporosis ([place]) 05/20/2018  . Closed T10 fracture ([place]) 05/20/2018  . Gout 03/27/2018  . Hypothyroidism 03/27/2018  . Subdural hematoma ([place]) 03/08/2018  . Abnormal SPEP 12/07/2017  . History of vitamin D deficiency 11/30/2017  . Hyperlipidemia associated with type 2 diabetes mellitus ([place]) 11/30/2017  . PAD (peripheral artery disease) ([place]) 07/19/2017  . Leg pain, lateral, left 07/19/2017  . Diabetes ([place]) 07/19/2017  . Hyperlipidemia 07/19/2017  . Essential hypertension 07/19/2017  . Mild aortic valve stenosis 06/22/2017  . Persistent atrial fibrillation  ([place]) 06/22/2017  . Moderate mitral insufficiency 10/30/2015  . Chronic kidney disease, stage 3 unspecified ([place]) 09/19/2014  . Type II or unspecified type diabetes mellitus with renal manifestations, not stated as uncontrolled(250.40) 11/08/2013  . Vitamin D deficiency 11/08/2013   PAST MEDICAL HISTORY:  Active Ambulatory Problems    Diagnosis Date Noted  . PAD (peripheral artery disease) ([place]) 07/19/2017  . Leg pain, lateral, left 07/19/2017  . Diabetes ([place]) 07/19/2017  . Hyperlipidemia 07/19/2017  . Essential hypertension 07/19/2017  . Abnormal SPEP 12/07/2017  . Atherosclerosis of native arteries of the extremities with ulceration ([place]) 03/01/2019  . Chronic kidney disease, stage 3 unspecified ([place]) 09/19/2014  . Chronic vertebral fracture due to osteoporosis ([place]) 05/20/2018  . Closed T10 fracture ([place]) 05/20/2018  . Gout 03/27/2018  . History of vitamin D deficiency 11/30/2017  . Hyperlipidemia associated with type 2 diabetes mellitus ([place]) 11/30/2017  . Hypothyroidism 03/27/2018  . Mild aortic valve stenosis 06/22/2017  . Moderate mitral insufficiency 10/30/2015  . Persistent atrial fibrillation ([place]) 06/22/2017  . Proteinuria 04/23/2019  . Subdural hematoma ([place]) 03/08/2018  . Type II or unspecified type diabetes mellitus with renal manifestations, not stated as uncontrolled(250.40) 11/08/2013  . Vitamin D deficiency 11/08/2013  .  Ischemia of extremity 07/31/2019  . Ischemia of right lower extremity 10/03/2019  . Protein-calorie malnutrition, severe 10/12/2019  . Palliative care by specialist   . DNR (do not resuscitate)   . Advanced directive placed in chart this admission   . Advanced directives, counseling/discussion   . Weakness   . Goals of care, counseling/discussion    Resolved Ambulatory Problems    Diagnosis Date Noted  . No Resolved Ambulatory Problems   Past Medical History:  Diagnosis Date  . A-fib ([place])   . CHF (congestive heart failure) ([place])   .  Diabetes mellitus without complication ([place])   . Hypertension    SOCIAL HX:  Social History   Tobacco Use  . Smoking status: Never Smoker  . Smokeless tobacco: Never Used  Substance Use Topics  . Alcohol use: No   FAMILY HX:  Family History  Problem Relation Age of Onset  . Cancer Brother       ALLERGIES:  Allergies  Allergen Reactions  . Sulfa Antibiotics Hives     PERTINENT MEDICATIONS:  Outpatient Encounter Medications as of 04/11/2020  Medication Sig  . ACCU-CHEK AVIVA PLUS test strip   . acetaminophen (TYLENOL) 325 MG tablet Take 325-650 mg by mouth every 6 (six) hours as needed for headache.   . allopurinol (ZYLOPRIM) 100 MG tablet Take 100 mg by mouth daily.  Marland Kitchen aspirin EC 81 MG EC tablet Take 1 tablet (81 mg total) by mouth daily.  . calcium carbonate (CALCIUM 600) 1500 (600 Ca) MG TABS tablet Take by mouth daily with breakfast.  . carvedilol (COREG) 6.25 MG tablet Take 6.25 mg by mouth 2 (two) times daily with a meal.   . clopidogrel (PLAVIX) 75 MG tablet TAKE (1) TABLET BY MOUTH EVERY DAY  . furosemide (LASIX) 20 MG tablet Take 20 mg by mouth 2 (two) times daily.   Marland Kitchen gabapentin (NEURONTIN) 100 MG capsule Take 100-200 mg by mouth at bedtime as needed (pain).   Marland Kitchen gabapentin (NEURONTIN) 100 MG capsule Take 100 mg by mouth 3 (three) times daily.  Marland Kitchen glucose blood (ACCU-CHEK AVIVA PLUS) test strip CHECK BLOOD SUGAR 3 TIMES DAILY  . HUMALOG KWIKPEN 100 UNIT/ML KiwkPen Inject 3 Units into the skin 3 (three) times daily after meals. Per sliding scale  . HYDROcodone-acetaminophen (NORCO/VICODIN) 5-325 MG tablet Take 1 tablet by mouth every 4 (four) hours as needed for moderate pain. (Patient not taking: Reported on 03/10/2020)  . LEVEMIR FLEXTOUCH 100 UNIT/ML Pen Inject 8 Units into the skin at bedtime.   Marland Kitchen levothyroxine (SYNTHROID, LEVOTHROID) 88 MCG tablet Take 88 mcg by mouth daily before breakfast.   . lisinopril (ZESTRIL) 5 MG tablet Take 5 mg by mouth daily.   Marland Kitchen  LORazepam (ATIVAN) 0.5 MG tablet Take 0.5 mg by mouth every 6 (six) hours as needed for anxiety.  . lovastatin (MEVACOR) 20 MG tablet Take 20 mg by mouth daily with supper.   . meclizine (ANTIVERT) 25 MG tablet Take 25 mg by mouth 3 (three) times daily as needed for dizziness.  (Patient not taking: Reported on 03/10/2020)  . mirtazapine (REMERON) 15 MG tablet Take by mouth.  . Multiple Vitamins-Minerals (PRESERVISION AREDS 2 PO) Take 1 tablet by mouth every evening.  Marland Kitchen oxyCODONE (OXY IR/ROXICODONE) 5 MG immediate release tablet Take 1-2 tablets (5-10 mg total) by mouth every 4 (four) hours as needed for moderate pain. (Patient not taking: Reported on 03/10/2020)  . potassium chloride SA (K-DUR,KLOR-CON) 20 MEQ tablet Take 20 mEq by  mouth daily.  (Patient not taking: Reported on 01/24/2020)  . Vitamin D3 (VITAMIN D) 25 MCG tablet Take 1,000 Units by mouth daily.    No facility-administered encounter medications on file as of 04/11/2020.    Objective: ROS  General: NAD EYES: denies vision changes, wears glasses ENMT: denies dysphagia Cardiovascular: denies chest pain Pulmonary: denies cough, denies increased SOB Abdomen: endorses good appetite, denies  constipation, endorses continence of bowel GU: denies dysuria, endorses continence of urine MSK:  endorses ROM limitations, no falls reported, R AKA Skin: endorse left pretibial bruise Neurological: endorses weakness, endorses neuropathic LE pain, endorses  insomnia Psych: Endorses occ anxious mood Heme/lymph/immuno: endorses L LE bruise, denies abnormal bleeding  Physical Exam: Current and past weights:unavailable Constitutional: NAD General: frail appearing, thin EYES: anicteric sclera,lids intact, no discharge  ENMT: intact hearing,oral mucous membranes moist CV:  no LE edema Pulmonary:  no increased work of breathing, no cough, no audible wheezes, room air Abdomen: intake 100%,no ascites GU: deferred MSK: severe sarcopenia,  decreased ROM in all extremities,  non ambulatory due to R AKA Skin: pretibial bruise, along vein line, not warm to touch, pt endorses some pain Neuro: Generalized weakness, no cognitive impairment, Psych: non-anxious affect, A and O x 3 Hem/lymph/immuno: no widespread bruising, bruise on L LE pre tibia, 1.5 cm x 0.5 cm   Thank you for the opportunity to participate in the care of Ms. Hogenson.  The palliative care team will continue to follow. Please call our office at [phone] if we can be of additional assistance.  Jason Coop, NP , DNP, MPH, AGPCNP-BC, ACHPN  COVID-19 PATIENT SCREENING TOOL  Person answering questions: ____________self______ _____   1.  Is the patient or any family member in the home showing any signs or symptoms regarding respiratory infection?               Person with Symptom- __________NA_________________  a. Fever                                                                          Yes___ No___          ___________________  b. Shortness of breath                                                    Yes___ No___          ___________________ c. Cough/congestion                                       Yes___  No___         ___________________ d. Body aches/pains                                                         Yes___ No___  ____________________ e. Gastrointestinal symptoms (diarrhea, nausea)           Yes___ No___        ____________________  2. Within the past 14 days, has anyone living in the home had any contact with someone with or under investigation for COVID-19?    Yes___ No_X_   Person __________________

## 2020-05-13 ENCOUNTER — Telehealth: Payer: Self-pay | Admitting: Primary Care

## 2020-05-13 NOTE — Telephone Encounter (Signed)
Called daughter to see if we could change the time of the 05/15/20 Palliative f/u visit from 1 to either 11:30 AM or 3 PM, no answer - left message requesting a return call to let me know if we could change the time of appt.  I also called the listed home number for patient with no answer and unable to leave a message due to no voicemail set up.

## 2020-05-15 ENCOUNTER — Other Ambulatory Visit: Payer: Medicare Other | Admitting: Primary Care

## 2020-05-15 ENCOUNTER — Other Ambulatory Visit: Payer: Self-pay

## 2020-05-15 DIAGNOSIS — E1151 Type 2 diabetes mellitus with diabetic peripheral angiopathy without gangrene: Secondary | ICD-10-CM

## 2020-05-15 DIAGNOSIS — Z794 Long term (current) use of insulin: Secondary | ICD-10-CM

## 2020-05-15 DIAGNOSIS — I4819 Other persistent atrial fibrillation: Secondary | ICD-10-CM

## 2020-05-15 DIAGNOSIS — I739 Peripheral vascular disease, unspecified: Secondary | ICD-10-CM

## 2020-05-15 DIAGNOSIS — Z515 Encounter for palliative care: Secondary | ICD-10-CM

## 2020-05-15 NOTE — Progress Notes (Addendum)
[facility] Palliative Care Consult Note Telephone: [phone]  Fax: [phone]     Date of encounter: 05/15/20 PATIENT NAME: [name] [address] [place] [id number] [phone] (home)  DOB: [birth year] MRN: [id number]  PRIMARY CARE PROVIDER:    Sofie Hartigan, MD,  [place] [address] [phone]  REFERRING PROVIDER:   Sofie Hartigan, MD [place],  [place] [address] [phone]  RESPONSIBLE PARTY:   Extended Emergency Contact Information Primary Emergency Contact: Odeh Carlson,Kathleen Home Phone: [phone] Mobile Phone: [phone] Relation: Son Secondary Emergency Contact: [name] Mobile Phone: [phone] Relation: Daughter  I met face to face with patient and family in home. Palliative Care was asked to follow this patient by consultation request of Carlson, Kathleen Noa, MD to help address advance care planning and goals of care. This is a follow up visit.   ASSESSMENT AND RECOMMENDATIONS:   1. Advance Care Planning/Goals of Care: Goals include to maximize quality of life and symptom management. Our advance care planning conversation included a discussion about:     Exploration of goals of care in the event of a sudden injury or illness   Review and updating or creation of an  advance directive document .  DNR on file  2. 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 She has not been taking this consistently. She has only been taking before bed when she has pain (every 3-4 days). Discussed the importance of taking medication daily for maximum effectiveness.  Sensory Loss: Endorse hearing loss. New hearing aids are amplifying sound too greatly and she cannot use them.  3. Follow up Palliative Care Visit: Palliative care will continue to follow for goals of care clarification and symptom management. Return 6-8  weeks or prn.  4. Family /Caregiver/Community Supports: Family is supportive. Patient is currently living with her daughter who has chronic illnesses. They help one another. Patient also has granddaughter who is nearby who she has stayed with in the past. Patient says her family never leaved her home alone.  5. Cognitive / Functional decline: Mobility is stable considering her past AKA, independent of ADLs and able to transfer to and from her wheelchair. No recent falls.  I spent 40 minutes providing this consultation,  from 1420 to 1500. More than 50% of the time in this consultation was spent in counseling and care coordination.  CODE STATUS: DNAR  PPS: 60%  HOSPICE ELIGIBILITY/DIAGNOSIS: TBD  Subjective:  CHIEF COMPLAINT: Follow up for response to gabapentin  HISTORY OF PRESENT ILLNESS:  [name] is a 87 y.o. year old female  with DMII, HTN, HLD, PAD, Afib, aortic stenosis, mitral valve insufficiency. Patient is seen today at home with her daughter. She is sitting up in her wheelchair. She got dressed herself this morning and cooked sausage and eggs for breakfast. Her appetite is good and she denies weight loss. She is independent with ADLs and is able to transfer to and from her wheelchair. She manages her own medications and monitors her blood glucose. Her last A1c was less than 7 per patient. She did start taking the gabapentin which is helping the phantom limb pain (s/p RLE AKA), but she states she only takes in every 3-4 nights when she is having pain. Patient denies any other physical complaints.   We are asked to consult around advance care planning and complex medical  decision making.    Review of case with family member- daughter  History obtained from review of EMR, discussion with primary team, and  interview with family, caregiver  and/or [name]. Records reviewed and summarized above.   CURRENT PROBLEM LIST:  Patient Active Problem List   Diagnosis Date Noted  .  Palliative care by specialist   . DNR (do not resuscitate)   . Advanced directive placed in chart this admission   . Advanced directives, counseling/discussion   . Weakness   . Goals of care, counseling/discussion   . Protein-calorie malnutrition, severe 10/12/2019  . Ischemia of right lower extremity 10/03/2019  . Ischemia of extremity 07/31/2019  . Proteinuria 04/23/2019  . Atherosclerosis of native arteries of the extremities with ulceration ([place]) 03/01/2019  . Chronic vertebral fracture due to osteoporosis ([place]) 05/20/2018  . Closed T10 fracture ([place]) 05/20/2018  . Gout 03/27/2018  . Hypothyroidism 03/27/2018  . Subdural hematoma ([place]) 03/08/2018  . Abnormal SPEP 12/07/2017  . History of vitamin D deficiency 11/30/2017  . Hyperlipidemia associated with type 2 diabetes mellitus ([place]) 11/30/2017  . PAD (peripheral artery disease) ([place]) 07/19/2017  . Leg pain, lateral, left 07/19/2017  . Diabetes ([place]) 07/19/2017  . Hyperlipidemia 07/19/2017  . Essential hypertension 07/19/2017  . Mild aortic valve stenosis 06/22/2017  . Persistent atrial fibrillation ([place]) 06/22/2017  . Moderate mitral insufficiency 10/30/2015  . Chronic kidney disease, stage 3 unspecified ([place]) 09/19/2014  . Type II or unspecified type diabetes mellitus with renal manifestations, not stated as uncontrolled(250.40) 11/08/2013  . Vitamin D deficiency 11/08/2013   PAST MEDICAL HISTORY:  Active Ambulatory Problems    Diagnosis Date Noted  . PAD (peripheral artery disease) ([place]) 07/19/2017  . Leg pain, lateral, left 07/19/2017  . Diabetes ([place]) 07/19/2017  . Hyperlipidemia 07/19/2017  . Essential hypertension 07/19/2017  . Abnormal SPEP 12/07/2017  . Atherosclerosis of native arteries of the extremities with ulceration ([place]) 03/01/2019  . Chronic kidney disease, stage 3 unspecified ([place]) 09/19/2014  . Chronic vertebral fracture due to osteoporosis ([place]) 05/20/2018  . Closed T10 fracture ([place]) 05/20/2018  .  Gout 03/27/2018  . History of vitamin D deficiency 11/30/2017  . Hyperlipidemia associated with type 2 diabetes mellitus ([place]) 11/30/2017  . Hypothyroidism 03/27/2018  . Mild aortic valve stenosis 06/22/2017  . Moderate mitral insufficiency 10/30/2015  . Persistent atrial fibrillation ([place]) 06/22/2017  . Proteinuria 04/23/2019  . Subdural hematoma ([place]) 03/08/2018  . Type II or unspecified type diabetes mellitus with renal manifestations, not stated as uncontrolled(250.40) 11/08/2013  . Vitamin D deficiency 11/08/2013  . Ischemia of extremity 07/31/2019  . Ischemia of right lower extremity 10/03/2019  . Protein-calorie malnutrition, severe 10/12/2019  . Palliative care by specialist   . DNR (do not resuscitate)   . Advanced directive placed in chart this admission   . Advanced directives, counseling/discussion   . Weakness   . Goals of care, counseling/discussion    Resolved Ambulatory Problems    Diagnosis Date Noted  . No Resolved Ambulatory Problems   Past Medical History:  Diagnosis Date  . A-fib ([place])   . CHF (congestive heart failure) ([place])   . Diabetes mellitus without complication ([place])   . Hypertension    SOCIAL HX:  Social History   Tobacco Use  . Smoking status: Never Smoker  . Smokeless tobacco: Never Used  Substance Use Topics  . Alcohol use: No   FAMILY HX:  Family History  Problem Relation Age of Onset  . Cancer Brother  ALLERGIES:  Allergies  Allergen Reactions  . Sulfa Antibiotics Hives     PERTINENT MEDICATIONS:  Outpatient Encounter Medications as of 05/15/2020  Medication Sig  . ACCU-CHEK AVIVA PLUS test strip   . acetaminophen (TYLENOL) 325 MG tablet Take 325-650 mg by mouth every 6 (six) hours as needed for headache.   . allopurinol (ZYLOPRIM) 100 MG tablet Take 100 mg by mouth daily.  Marland Kitchen aspirin EC 81 MG EC tablet Take 1 tablet (81 mg total) by mouth daily.  . calcium carbonate (CALCIUM 600) 1500 (600 Ca) MG TABS tablet Take by mouth  daily with breakfast.  . carvedilol (COREG) 6.25 MG tablet Take 6.25 mg by mouth 2 (two) times daily with a meal.   . clopidogrel (PLAVIX) 75 MG tablet TAKE (1) TABLET BY MOUTH EVERY DAY  . furosemide (LASIX) 20 MG tablet Take 20 mg by mouth 2 (two) times daily.   Marland Kitchen gabapentin (NEURONTIN) 100 MG capsule Take 100-200 mg by mouth at bedtime as needed (pain).   Marland Kitchen gabapentin (NEURONTIN) 100 MG capsule Take 100 mg by mouth 3 (three) times daily.  Marland Kitchen glucose blood (ACCU-CHEK AVIVA PLUS) test strip CHECK BLOOD SUGAR 3 TIMES DAILY  . HUMALOG KWIKPEN 100 UNIT/ML KiwkPen Inject 3 Units into the skin 3 (three) times daily after meals. Per sliding scale  . HYDROcodone-acetaminophen (NORCO/VICODIN) 5-325 MG tablet Take 1 tablet by mouth every 4 (four) hours as needed for moderate pain. (Patient not taking: Reported on 03/10/2020)  . LEVEMIR FLEXTOUCH 100 UNIT/ML Pen Inject 8 Units into the skin at bedtime.   Marland Kitchen levothyroxine (SYNTHROID, LEVOTHROID) 88 MCG tablet Take 88 mcg by mouth daily before breakfast.   . lisinopril (ZESTRIL) 5 MG tablet Take 5 mg by mouth daily.   Marland Kitchen LORazepam (ATIVAN) 0.5 MG tablet Take 0.5 mg by mouth every 6 (six) hours as needed for anxiety.  . lovastatin (MEVACOR) 20 MG tablet Take 20 mg by mouth daily with supper.   . meclizine (ANTIVERT) 25 MG tablet Take 25 mg by mouth 3 (three) times daily as needed for dizziness.  (Patient not taking: Reported on 03/10/2020)  . mirtazapine (REMERON) 15 MG tablet Take by mouth.  . Multiple Vitamins-Minerals (PRESERVISION AREDS 2 PO) Take 1 tablet by mouth every evening.  Marland Kitchen oxyCODONE (OXY IR/ROXICODONE) 5 MG immediate release tablet Take 1-2 tablets (5-10 mg total) by mouth every 4 (four) hours as needed for moderate pain. (Patient not taking: Reported on 03/10/2020)  . potassium chloride SA (K-DUR,KLOR-CON) 20 MEQ tablet Take 20 mEq by mouth daily.  (Patient not taking: Reported on 01/24/2020)  . Vitamin D3 (VITAMIN D) 25 MCG tablet Take 1,000  Units by mouth daily.    No facility-administered encounter medications on file as of 05/15/2020.    Objective: ROS   General: NAD EYES: denies vision changes, wears glasses ENMT: denies dysphagia, wears hearing aids Cardiovascular: denies chest pain Pulmonary: denies  cough, denies increased SOB Abdomen: endorses good appetite, denies constipation, endorses continence of bowel GU: denies dysuria, endorses continence of urine MSK:  endorses ROM limitations, no falls reported Skin: denies rashes or wounds other than a superficial skin tear  Neurological: denies weakness, endorses intermittent pain, denies insomnia Psych: Endorses positive mood Heme/lymph/immuno: denies bruises, abnormal bleeding  Physical Exam: Current and past weights: stable per patient  Constitutional:  NAD General: frail appearing, thin EYES: anicteric sclera, lids intact, no discharge  ENMT: intact hearing,oral mucous membranes moist, dentition intact (dentures) CV: Grade 3 systolic murmur, regular rate,  irregular rhythm, no LLE edema Pulmonary: LCTA, no increased work of breathing, no cough, no audible wheezes, room air Abdomen: intake 90-100%, normo-active BS +  4 quadrants, soft and non tender, no ascites GU: deferred MSK: mod sarcopenia, decreased ROM in all extremities, previous RLE AKA, no contractures of LE Skin: warm and dry, R FA skin tear, covered Neuro: Generalized weakness, no cognitive impairment Psych: non-anxious affect, A and O x 4 Hem/lymph/immuno: no widespread bruising   Thank you for the opportunity to participate in the care of Ms. Niazi.  The palliative care team will continue to follow. Please call our office at [phone] if we can be of additional assistance.  Jason Coop, NP , DNP, MPH, AGPCNP-BC, ACHPN   COVID-19 PATIENT SCREENING TOOL  Person answering questions: _______Patient____________   1.  Is the patient or any family member in the home showing any signs  or symptoms regarding respiratory infection?                  Person with Symptom  ______________na___________ a. Fever/chills/headache                                                        Yes___ No__X_            b. Shortness of breath                                                            Yes___ No__X_           c. Cough/congestion                                               Yes___  No__X_          d. Muscle/Body aches/pains                                                   Yes___ No__X_         e. Gastrointestinal symptoms (diarrhea,nausea)             Yes___ No__X_         f. Sudden loss of smell or taste      Yes___ No__X_        2. Within the past 10 days, has anyone living in the home had any contact with someone with or under investigation for COVID-19?    Yes___ No__X__   Person __________________

## 2020-05-22 ENCOUNTER — Ambulatory Visit (INDEPENDENT_AMBULATORY_CARE_PROVIDER_SITE_OTHER): Payer: Medicare Other | Admitting: Vascular Surgery

## 2020-05-22 ENCOUNTER — Other Ambulatory Visit: Payer: Self-pay

## 2020-05-22 ENCOUNTER — Encounter (INDEPENDENT_AMBULATORY_CARE_PROVIDER_SITE_OTHER): Payer: Self-pay | Admitting: Vascular Surgery

## 2020-05-22 VITALS — BP 160/70 | HR 49 | Resp 15

## 2020-05-22 DIAGNOSIS — E782 Mixed hyperlipidemia: Secondary | ICD-10-CM

## 2020-05-22 DIAGNOSIS — I4819 Other persistent atrial fibrillation: Secondary | ICD-10-CM

## 2020-05-22 DIAGNOSIS — E1169 Type 2 diabetes mellitus with other specified complication: Secondary | ICD-10-CM

## 2020-05-22 DIAGNOSIS — E785 Hyperlipidemia, unspecified: Secondary | ICD-10-CM

## 2020-05-22 DIAGNOSIS — I739 Peripheral vascular disease, unspecified: Secondary | ICD-10-CM | POA: Diagnosis not present

## 2020-05-22 DIAGNOSIS — I10 Essential (primary) hypertension: Secondary | ICD-10-CM | POA: Diagnosis not present

## 2020-05-25 ENCOUNTER — Encounter (INDEPENDENT_AMBULATORY_CARE_PROVIDER_SITE_OTHER): Payer: Self-pay | Admitting: Vascular Surgery

## 2020-05-25 NOTE — Progress Notes (Signed)
MRN : [id number]  [name] is a 87 y.o. (1925/08/02) female who presents with chief complaint of  Chief Complaint  Patient presents with  . Follow-up    6 month follow up  .  History of Present Illness:   Patient returns s/p history of right AKA.  She continues to have phantom pains but overall is doing pretty well.  Denies rest pain or ulceration of the left leg.  Current Meds  Medication Sig  . ACCU-CHEK AVIVA PLUS test strip   . acetaminophen (TYLENOL) 325 MG tablet Take 325-650 mg by mouth every 6 (six) hours as needed for headache.   . allopurinol (ZYLOPRIM) 100 MG tablet Take 100 mg by mouth daily.  Marland Kitchen aspirin EC 81 MG EC tablet Take 1 tablet (81 mg total) by mouth daily.  . calcium carbonate (OSCAL) 1500 (600 Ca) MG TABS tablet Take by mouth daily with breakfast.  . carvedilol (COREG) 6.25 MG tablet Take 6.25 mg by mouth 2 (two) times daily with a meal.   . clopidogrel (PLAVIX) 75 MG tablet TAKE (1) TABLET BY MOUTH EVERY DAY  . furosemide (LASIX) 20 MG tablet Take 20 mg by mouth 2 (two) times daily.   Marland Kitchen gabapentin (NEURONTIN) 100 MG capsule Take 100-200 mg by mouth at bedtime as needed (pain).   Marland Kitchen gabapentin (NEURONTIN) 100 MG capsule Take 100 mg by mouth 3 (three) times daily.  Marland Kitchen glucose blood (ACCU-CHEK AVIVA PLUS) test strip CHECK BLOOD SUGAR 3 TIMES DAILY  . HUMALOG KWIKPEN 100 UNIT/ML KiwkPen Inject 3 Units into the skin 3 (three) times daily after meals. Per sliding scale  . LEVEMIR FLEXTOUCH 100 UNIT/ML Pen Inject 8 Units into the skin at bedtime.   Marland Kitchen levothyroxine (SYNTHROID, LEVOTHROID) 88 MCG tablet Take 88 mcg by mouth daily before breakfast.   . lisinopril (ZESTRIL) 5 MG tablet Take 5 mg by mouth daily.   Marland Kitchen LORazepam (ATIVAN) 0.5 MG tablet Take 0.5 mg by mouth every 6 (six) hours as needed for anxiety.  . lovastatin (MEVACOR) 20 MG tablet Take 20 mg by mouth daily with supper.   . mirtazapine (REMERON) 15 MG tablet Take by mouth.  . Multiple  Vitamins-Minerals (PRESERVISION AREDS 2 PO) Take 1 tablet by mouth every evening.  . Vitamin D3 (VITAMIN D) 25 MCG tablet Take 1,000 Units by mouth daily.     Past Medical History:  Diagnosis Date  . A-fib ([place])   . CHF (congestive heart failure) ([place])   . Diabetes mellitus without complication ([place])   . Hypertension     Past Surgical History:  Procedure Laterality Date  . AMPUTATION Right 10/10/2019   Procedure: AMPUTATION ABOVE KNEE;  Surgeon: Katha Cabal, MD;  Location: [facility]S;  Service: Vascular;  Laterality: Right;  . CHOLECYSTECTOMY    . LOWER EXTREMITY ANGIOGRAPHY Right 08/12/2017   Procedure: LOWER EXTREMITY ANGIOGRAPHY;  Surgeon: Katha Cabal, MD;  Location: [facility];  Service: Cardiovascular;  Laterality: Right;  . LOWER EXTREMITY ANGIOGRAPHY Right 06/13/2018   Procedure: LOWER EXTREMITY ANGIOGRAPHY;  Surgeon: Katha Cabal, MD;  Location: [facility];  Service: Cardiovascular;  Laterality: Right;  . LOWER EXTREMITY ANGIOGRAPHY Right 03/01/2019   Procedure: LOWER EXTREMITY ANGIOGRAPHY;  Surgeon: Katha Cabal, MD;  Location: [facility];  Service: Cardiovascular;  Laterality: Right;  . LOWER EXTREMITY ANGIOGRAPHY Right 07/31/2019   Procedure: LOWER EXTREMITY ANGIOGRAPHY;  Surgeon: Katha Cabal, MD;  Location: [facility];  Service: Cardiovascular;  Laterality: Right;  . LOWER EXTREMITY ANGIOGRAPHY Right 10/04/2019   Procedure: Lower Extremity Angiography;  Surgeon: Katha Cabal, MD;  Location: [facility];  Service: Cardiovascular;  Laterality: Right;    Social History Social History   Tobacco Use  . Smoking status: Never Smoker  . Smokeless tobacco: Never Used  Vaping Use  . Vaping Use: Never used  Substance Use Topics  . Alcohol use: No  . Drug use: No    Family History Family History  Problem Relation Age of Onset  . Cancer Brother     Allergies  Allergen Reactions  . Sulfa  Antibiotics Hives     REVIEW OF SYSTEMS (Negative unless checked)  Constitutional: '[]'$ Weight loss  '[]'$ Fever  '[]'$ Chills Cardiac: '[]'$ Chest pain   '[]'$ Chest pressure   '[]'$ Palpitations   '[]'$ Shortness of breath when laying flat   '[]'$ Shortness of breath with exertion. Vascular:  '[]'$ Pain in legs with walking   '[]'$ Pain in legs at rest  '[]'$ History of DVT   '[]'$ Phlebitis   '[]'$ Swelling in legs   '[]'$ Varicose veins   '[]'$ Non-healing ulcers Pulmonary:   '[]'$ Uses home oxygen   '[]'$ Productive cough   '[]'$ Hemoptysis   '[]'$ Wheeze  '[x]'$ COPD   '[]'$ Asthma Neurologic:  '[]'$ Dizziness   '[]'$ Seizures   '[]'$ History of stroke   '[]'$ History of TIA  '[]'$ Aphasia   '[]'$ Vissual changes   '[]'$ Weakness or numbness in arm   '[]'$ Weakness or numbness in leg Musculoskeletal:   '[]'$ Joint swelling   '[x]'$ Joint pain   '[]'$ Low back pain Hematologic:  '[]'$ Easy bruising  '[]'$ Easy bleeding   '[]'$ Hypercoagulable state   '[]'$ Anemic Gastrointestinal:  '[]'$ Diarrhea   '[]'$ Vomiting  '[]'$ Gastroesophageal reflux/heartburn   '[]'$ Difficulty swallowing. Genitourinary:  '[x]'$ Chronic kidney disease   '[]'$ Difficult urination  '[]'$ Frequent urination   '[]'$ Blood in urine Skin:  '[]'$ Rashes   '[]'$ Ulcers  Psychological:  '[]'$ History of anxiety   '[]'$  History of major depression.  Physical Examination  Vitals:   05/22/20 1102  BP: (!) 160/70  Pulse: (!) 49  Resp: 15   There is no height or weight on file to calculate BMI. Gen: WD/WN, NAD Head: [place]/AT, No temporalis wasting.  Ear/Nose/Throat: Hearing grossly intact, nares w/o erythema or drainage Eyes: PER, EOMI, sclera nonicteric.  Neck: Supple, no large masses.   Pulmonary:  Good air movement, no audible wheezing bilaterally, no use of accessory muscles.  Cardiac: RRR, no JVD Vascular: right AKA Vessel Right Left  Radial Palpable Palpable  PT AKA Not Palpable  DP AKA Not Palpable  Gastrointestinal: Non-distended. No guarding/no peritoneal signs.  Musculoskeletal: M/S 5/5 throughout.  No deformity or atrophy.  Neurologic: CN 2-12 intact. Symmetrical.  Speech is fluent.  Motor exam as listed above. Psychiatric: Judgment intact, Mood & affect appropriate for pt's clinical situation. Dermatologic: No rashes or ulcers noted.  No changes consistent with cellulitis.   CBC Lab Results  Component Value Date   WBC 14.9 (H) 10/25/2019   HGB 8.1 (L) 10/25/2019   HCT 27.0 (L) 10/25/2019   MCV 93.4 10/25/2019   PLT 384 10/25/2019    BMET    Component Value Date/Time   NA 141 10/25/2019 0446   NA 141 08/03/2011 0729   K 4.9 10/25/2019 0446   K 3.6 08/03/2011 0729   CL 110 10/25/2019 0446   CL 105 08/03/2011 0729   CO2 25 10/25/2019 0446   CO2 26 08/03/2011 0729   GLUCOSE 119 (H) 10/25/2019 0446   GLUCOSE 149 (H) 08/03/2011 0729   BUN 17 10/25/2019 0446  BUN 16 08/03/2011 0729   CREATININE 1.30 (H) 10/25/2019 0446   CREATININE 1.07 08/03/2011 0729   CALCIUM 8.7 (L) 10/25/2019 0446   CALCIUM 9.5 08/03/2011 0729   GFRNONAA 36 (L) 10/25/2019 0446   GFRNONAA 48 (L) 08/03/2011 0729   GFRAA 41 (L) 10/25/2019 0446   GFRAA 56 (L) 08/03/2011 0729   CrCl cannot be calculated (Patient's most recent lab result is older than the maximum 21 days allowed.).  COAG Lab Results  Component Value Date   INR 1.1 10/10/2019   INR 1.2 10/03/2019   INR 1.1 03/01/2019    Radiology No results found.   Assessment/Plan 1. PAD (peripheral artery disease) (HCC)  Recommend:  The patient has evidence of atherosclerosis of the lower extremities with claudication.  The patient does not voice lifestyle limiting changes at this point in time.  Noninvasive studies do not suggest clinically significant change.  No invasive studies, angiography or surgery at this time The patient should continue walking and begin a more formal exercise program.  The patient should continue antiplatelet therapy and aggressive treatment of the lipid abnormalities  No changes in the patient's medications at this time  The patient should continue wearing graduated compression socks 10-15  mmHg strength to control the mild edema.   - VAS [place] ABI WITH/WO TBI; Future  2. Essential hypertension Continue antihypertensive medications as already ordered, these medications have been reviewed and there are no changes at this time.   3. Persistent atrial fibrillation (HCC) Continue antiarrhythmia medications as already ordered, these medications have been reviewed and there are no changes at this time.  Continue anticoagulation as ordered by Cardiology Service   4. Hyperlipidemia associated with type 2 diabetes mellitus ([place]) Continue hypoglycemic medications as already ordered, these medications have been reviewed and there are no changes at this time.  Hgb A1C to be monitored as already arranged by primary service   5. Mixed hyperlipidemia Continue statin as ordered and reviewed, no changes at this time     [name], MD  05/25/2020 3:44 PM

## 2020-07-17 ENCOUNTER — Other Ambulatory Visit: Payer: Medicare Other | Admitting: Primary Care

## 2020-07-17 ENCOUNTER — Other Ambulatory Visit: Payer: Self-pay

## 2020-07-24 ENCOUNTER — Other Ambulatory Visit: Payer: Medicare Other | Admitting: Primary Care

## 2020-07-24 ENCOUNTER — Other Ambulatory Visit: Payer: Self-pay

## 2020-07-24 DIAGNOSIS — Z515 Encounter for palliative care: Secondary | ICD-10-CM

## 2020-07-24 DIAGNOSIS — M79605 Pain in left leg: Secondary | ICD-10-CM

## 2020-07-24 DIAGNOSIS — R531 Weakness: Secondary | ICD-10-CM

## 2020-07-24 DIAGNOSIS — I739 Peripheral vascular disease, unspecified: Secondary | ICD-10-CM

## 2020-07-24 NOTE — Progress Notes (Signed)
[facility] Consult Note Telephone: [phone]  Fax: [phone]    Date of encounter: 07/24/20 PATIENT NAME: [name] [address] [place] [address]   [phone] (home) [phone] (work) DOB: 09/30/1925 MRN: [id number] PRIMARY CARE PROVIDER:    Sofie Hartigan, MD,  [place] [address] [phone]  REFERRING PROVIDER:   Sofie Hartigan, MD [place],  [place] [address] [phone]  RESPONSIBLE PARTY:    Contact Information    Name Relation Home Work Mobile   Kathleen III,Carlson Son [phone]  [phone]   [name] Daughter   [phone]   [name] Granddaughter   [phone]      I met face to face with patient and  home/facility. Palliative Care was asked to follow this patient by consultation request of  Feldpausch, Chrissie Noa, MD to address advance care planning and complex medical decision making. This is a follow up visit.                                   ASSESSMENT AND PLAN / RECOMMENDATIONS:   Advance Care Planning/Goals of Care: Goals include to maximize quality of life and symptom management. Our advance care planning conversation included a discussion about:     Reviewed previous, no changes. We discussed patient wishes to be DNR< comfort measures, limited use of abx and iv, no feeding tube. I recreated and uploaded her documents as she had left them at her previous rental apartment.  CODE STATUS: DNR  Symptom Management/Plan  Pain: phantom limb pain, on gabapentin. We have discussed taking this as scheduled, but patient continues to take PRN . She has had insomnia which she does not directly relate to pain. However, we discussed her taking gabapentin at hs to address underlying chronic neuropathic /diabetic pain.  Insomnia: We discuss that taking gabapentin may help with her sleeping by treating her pain. She also is on Mirtazapine but reports taking  it sporadically.  Caregiver needs: Pt has good family support. One daughter lives in the home, granddaughter spends the night sometimes, other children bring regular meals to her home. We also discussed DME needs. Her bathroom and kitchen were renovated to allow her access by  W/c since her amputation has limited to w/c. She states however the shower doors were put on and she has no where to take a bath.  Blood Glucose control: Checks BG 3x per day. Insulin dependent. hgb a1c remains less than 7; she is well controlled.   Mobility: Wheelchair dependent, recently moved back into her home which has been adapted for wheelchair use. Lower countertops that patient is able to use and prepare some of her own meals. She is able to get her wheelchair in and out of the bathroom to use the commode.  Dizziness: Intermittent dizziness, no syncope or falls. Has holter monitor in place and she is being worked-up for this from a cardiac standpoint.  She has some facial bruises (around L eye and under R eye) but denies falling.  Follow up Palliative Care Visit: Palliative care will continue to follow for complex medical decision making, advance care planning, and clarification of goals. Return 8 weeks or prn.  I spent 40 minutes providing this consultation. More than 50% of the time in this consultation was spent in counseling and care coordination.  PPS: 40%  HOSPICE ELIGIBILITY/DIAGNOSIS:  TBD  Chief Complaint: pain and mobility issues   HISTORY OF PRESENT ILLNESS:  [name] is a 87 y.o. year old female  with DMII, HTN, HLD, PAD, Afib, aortic stenosis, mitral valve insufficiency. Patient is seen today at home. She is resting on the couch. She has a neighbor stop by to say hello during our visit. She did not sleep well last night, but denies this being due to pain. She does have intermittent phantom limb pain which she takes gabapentin for.   History obtained from review of EMR, discussion with primary  team, and interview with family, facility staff/caregiver and/or [name].  I reviewed available labs, medications, imaging, studies and related documents from the EMR.  Records reviewed and summarized above.   ROS General: NAD EYES: denies vision changes, uses  glasses ENMT: denies dysphagia Cardiovascular: denies chest pain, denies DOE, intermittent dizziness Pulmonary: denies cough, denies increased SOB Abdomen: endorses good appetite, denies constipation, endorses continence of bowel GU: denies dysuria, endorses continence of urine MSK:  Endorses increased weakness,  no falls reported Skin: denies rashes or wounds Neurological: endorses occ pain, endorses insomnia Psych: Endorses positive mood  Physical Exam: Current and past weights: stable Constitutional: NAD General: frail appearing, thin  EYES: anicteric sclera, lids intact, no discharge  ENMT: intact hearing, oral mucous membranes moist CV: S1S2, RRR, no LE edema, Holter in place  Pulmonary: LCTA, no increased work of breathing, no cough, room air Abdomen: intake 100%, no ascites MSK: severe sarcopenia, moves all extremities, RLE AKA, wheelchair bound Skin: warm and dry, no rashes or wounds on visible skin, holter monitor in place Neuro:  no generalized weakness,  No cognitive impairment Psych: non-anxious affect, A and O x 3   Thank you for the opportunity to participate in the care of [name].  The palliative care team will continue to follow. Please call our office at [phone] if we can be of additional assistance.   Jason Coop, NP , DNP, MPH, AGPCNP-BC, ACHPN  COVID-19 PATIENT SCREENING TOOL Asked and negative response unless otherwise noted:   Have you had symptoms of covid, tested positive or been in contact with someone with symptoms/positive test in the past 5-10 days?

## 2020-11-08 ENCOUNTER — Other Ambulatory Visit (INDEPENDENT_AMBULATORY_CARE_PROVIDER_SITE_OTHER): Payer: Self-pay | Admitting: Vascular Surgery

## 2020-11-24 ENCOUNTER — Other Ambulatory Visit: Payer: Self-pay

## 2020-11-24 ENCOUNTER — Other Ambulatory Visit: Payer: Medicare Other | Admitting: Primary Care

## 2020-11-28 ENCOUNTER — Other Ambulatory Visit (INDEPENDENT_AMBULATORY_CARE_PROVIDER_SITE_OTHER): Payer: Self-pay | Admitting: Vascular Surgery

## 2020-11-28 DIAGNOSIS — I739 Peripheral vascular disease, unspecified: Secondary | ICD-10-CM

## 2020-11-30 NOTE — Progress Notes (Signed)
MRN : [id number]  [name] is a 87 y.o. ([birth year]) female who presents with chief complaint of follow up for my leg.  History of Present Illness:   The patient returns to the office for followup and review of the noninvasive studies. There have been no interval changes in the left lower extremity symptoms. No interval development of rest pain symptoms. No new ulcers or wounds have occurred since the last visit.  She continues to have phantom pains in the right lower extremity which is controled by gabapentin.  There have been no significant changes to the patient's overall health care.  The patient denies amaurosis fugax or recent TIA symptoms. There are no recent neurological changes noted. The patient denies history of DVT, PE or superficial thrombophlebitis. The patient denies recent episodes of angina or shortness of breath.   ABI Rt=AKA and Lt=Icard TBI=0.65     No outpatient medications have been marked as taking for the 12/01/20 encounter (Appointment) with Delana Meyer, Dolores Lory, MD.    Past Medical History:  Diagnosis Date   A-fib [facility])    CHF (congestive heart failure) ([place])    Diabetes mellitus without complication ([place])    Hypertension     Past Surgical History:  Procedure Laterality Date   AMPUTATION Right 10/10/2019   Procedure: AMPUTATION ABOVE KNEE;  Surgeon: Katha Cabal, MD;  Location: [facility] ORS;  Service: Vascular;  Laterality: Right;   CHOLECYSTECTOMY     LOWER EXTREMITY ANGIOGRAPHY Right 08/12/2017   Procedure: LOWER EXTREMITY ANGIOGRAPHY;  Surgeon: Katha Cabal, MD;  Location: [place] CV LAB;  Service: Cardiovascular;  Laterality: Right;   LOWER EXTREMITY ANGIOGRAPHY Right 06/13/2018   Procedure: LOWER EXTREMITY ANGIOGRAPHY;  Surgeon: Katha Cabal, MD;  Location: [place] [facility];  Service: Cardiovascular;  Laterality: Right;   LOWER EXTREMITY ANGIOGRAPHY Right 03/01/2019   Procedure: LOWER EXTREMITY ANGIOGRAPHY;  Surgeon: Katha Cabal, MD;  Location: [place] CV LAB;  Service: Cardiovascular;  Laterality: Right;   LOWER EXTREMITY ANGIOGRAPHY Right 07/31/2019   Procedure: LOWER EXTREMITY ANGIOGRAPHY;  Surgeon: Katha Cabal, MD;  Location: [place] CV LAB;  Service: Cardiovascular;  Laterality: Right;   LOWER EXTREMITY ANGIOGRAPHY Right 10/04/2019   Procedure: Lower Extremity Angiography;  Surgeon: Katha Cabal, MD;  Location: [place] CV LAB;  Service: Cardiovascular;  Laterality: Right;    Social History Social History   Tobacco Use   Smoking status: Never   Smokeless tobacco: Never  Vaping Use   Vaping Use: Never used  Substance Use Topics   Alcohol use: No   Drug use: No    Family History Family History  Problem Relation Age of Onset   Cancer Brother     Allergies  Allergen Reactions   Sulfa Antibiotics Hives     REVIEW OF SYSTEMS (Negative unless checked)  Constitutional: '[]'$ Weight loss  '[]'$ Fever  '[]'$ Chills Cardiac: '[]'$ Chest pain   '[]'$ Chest pressure   '[]'$ Palpitations   '[]'$ Shortness of breath when laying flat   '[]'$ Shortness of breath with exertion. Vascular:  '[]'$ Pain in legs with walking   '[]'$ Pain in legs at rest  '[]'$ History of DVT   '[]'$ Phlebitis   '[]'$ Swelling in legs   '[]'$ Varicose veins   '[]'$ Non-healing ulcers Pulmonary:   '[]'$ Uses home oxygen   '[]'$ Productive cough   '[]'$ Hemoptysis   '[]'$ Wheeze  '[]'$ COPD   '[]'$ Asthma Neurologic:  '[]'$ Dizziness   '[]'$ Seizures   '[]'$ History of stroke   '[]'$ History of TIA  '[]'$ Aphasia   '[]'$ Vissual changes   '[]'$   Weakness or numbness in arm   '[]'$ Weakness or numbness in leg Musculoskeletal:   '[]'$ Joint swelling   '[]'$ Joint pain   '[]'$ Low back pain Hematologic:  '[]'$ Easy bruising  '[]'$ Easy bleeding   '[]'$ Hypercoagulable state   '[]'$ Anemic Gastrointestinal:  '[]'$ Diarrhea   '[]'$ Vomiting  '[]'$ Gastroesophageal reflux/heartburn   '[]'$ Difficulty swallowing. Genitourinary:  '[]'$ Chronic kidney disease   '[]'$ Difficult urination  '[]'$ Frequent urination   '[]'$ Blood in urine Skin:  '[]'$ Rashes   '[]'$ Ulcers  Psychological:   '[]'$ History of anxiety   '[]'$  History of major depression.  Physical Examination  There were no vitals filed for this visit. There is no height or weight on file to calculate BMI. Gen: WD/WN, NAD; seen in a wheelchair Head: [place]/AT, No temporalis wasting.  Ear/Nose/Throat: Hearing grossly intact, nares w/o erythema or drainage Eyes: PER, EOMI, sclera nonicteric.  Neck: Supple, no masses.  No bruit or JVD.  Pulmonary:  Good air movement, no audible wheezing, no use of accessory muscles.  Cardiac: RRR, normal S1, S2, no Murmurs. Vascular:   Vessel Right Left  Radial Palpable Palpable  PT AKA Not Palpable  DP AKA Not Palpable  Gastrointestinal: soft, non-distended. No guarding/no peritoneal signs.  Musculoskeletal: M/S 5/5 throughout.  No visible deformity.  Neurologic: CN 2-12 intact. Pain and light touch intact in extremities.  Symmetrical.  Speech is fluent. Motor exam as listed above. Psychiatric: Judgment intact, Mood & affect appropriate for pt's clinical situation. Dermatologic: No rashes or ulcers noted.  No changes consistent with cellulitis.   CBC Lab Results  Component Value Date   WBC 14.9 (H) 10/25/2019   HGB 8.1 (L) 10/25/2019   HCT 27.0 (L) 10/25/2019   MCV 93.4 10/25/2019   PLT 384 10/25/2019    BMET    Component Value Date/Time   NA 141 10/25/2019 0446   NA 141 08/03/2011 0729   K 4.9 10/25/2019 0446   K 3.6 08/03/2011 0729   CL 110 10/25/2019 0446   CL 105 08/03/2011 0729   CO2 25 10/25/2019 0446   CO2 26 08/03/2011 0729   GLUCOSE 119 (H) 10/25/2019 0446   GLUCOSE 149 (H) 08/03/2011 0729   BUN 17 10/25/2019 0446   BUN 16 08/03/2011 0729   CREATININE 1.30 (H) 10/25/2019 0446   CREATININE 1.07 08/03/2011 0729   CALCIUM 8.7 (L) 10/25/2019 0446   CALCIUM 9.5 08/03/2011 0729   GFRNONAA 36 (L) 10/25/2019 0446   GFRNONAA 48 (L) 08/03/2011 0729   GFRAA 41 (L) 10/25/2019 0446   GFRAA 56 (L) 08/03/2011 0729   CrCl cannot be calculated (Patient's most recent  lab result is older than the maximum 21 days allowed.).  COAG Lab Results  Component Value Date   INR 1.1 10/10/2019   INR 1.2 10/03/2019   INR 1.1 03/01/2019    Radiology No results found.   Assessment/Plan 1. PAD (peripheral artery disease) (HCC) Recommend:   The patient has evidence of atherosclerosis of the lower extremities with claudication.  The patient does not voice lifestyle limiting changes at this point in time.   Noninvasive studies do not suggest clinically significant change.   No invasive studies, angiography or surgery at this time The patient should continue walking and begin a more formal exercise program.  The patient should continue antiplatelet therapy and aggressive treatment of the lipid abnormalities   No changes in the patient's medications at this time  - VAS [place] ABI WITH/WO TBI; Future  2. Essential hypertension Continue antihypertensive medications as already ordered, these medications have been reviewed and  there are no changes at this time.   3. Persistent atrial fibrillation (HCC) Continue antiarrhythmia medications as already ordered, these medications have been reviewed and there are no changes at this time.  Continue anticoagulation as ordered by Cardiology Service   4. Type 2 diabetes mellitus with diabetic peripheral angiopathy without gangrene, with long-term current use of insulin (HCC) Continue hypoglycemic medications as already ordered, these medications have been reviewed and there are no changes at this time.  Hgb A1C to be monitored as already arranged by primary service   5. Mixed hyperlipidemia Continue statin as ordered and reviewed, no changes at this time     [name], MD  11/30/2020 1:55 PM

## 2020-12-01 ENCOUNTER — Other Ambulatory Visit: Payer: Self-pay

## 2020-12-01 ENCOUNTER — Encounter (INDEPENDENT_AMBULATORY_CARE_PROVIDER_SITE_OTHER): Payer: Self-pay | Admitting: Vascular Surgery

## 2020-12-01 ENCOUNTER — Ambulatory Visit (INDEPENDENT_AMBULATORY_CARE_PROVIDER_SITE_OTHER): Payer: Medicare Other

## 2020-12-01 ENCOUNTER — Ambulatory Visit (INDEPENDENT_AMBULATORY_CARE_PROVIDER_SITE_OTHER): Payer: Medicare Other | Admitting: Vascular Surgery

## 2020-12-01 VITALS — BP 189/88 | HR 55 | Resp 15

## 2020-12-01 DIAGNOSIS — I739 Peripheral vascular disease, unspecified: Secondary | ICD-10-CM

## 2020-12-01 DIAGNOSIS — E782 Mixed hyperlipidemia: Secondary | ICD-10-CM

## 2020-12-01 DIAGNOSIS — I10 Essential (primary) hypertension: Secondary | ICD-10-CM

## 2020-12-01 DIAGNOSIS — E1151 Type 2 diabetes mellitus with diabetic peripheral angiopathy without gangrene: Secondary | ICD-10-CM | POA: Diagnosis not present

## 2020-12-01 DIAGNOSIS — I4819 Other persistent atrial fibrillation: Secondary | ICD-10-CM

## 2020-12-01 DIAGNOSIS — Z794 Long term (current) use of insulin: Secondary | ICD-10-CM

## 2020-12-09 ENCOUNTER — Other Ambulatory Visit: Payer: Self-pay

## 2020-12-09 ENCOUNTER — Other Ambulatory Visit: Payer: Medicare Other | Admitting: Primary Care

## 2020-12-09 DIAGNOSIS — R531 Weakness: Secondary | ICD-10-CM

## 2020-12-09 DIAGNOSIS — Z515 Encounter for palliative care: Secondary | ICD-10-CM

## 2020-12-09 DIAGNOSIS — E1151 Type 2 diabetes mellitus with diabetic peripheral angiopathy without gangrene: Secondary | ICD-10-CM

## 2020-12-09 DIAGNOSIS — M79605 Pain in left leg: Secondary | ICD-10-CM

## 2020-12-09 NOTE — Progress Notes (Signed)
[facility] Consult Note Telephone: [phone]  Fax: [phone]    Date of encounter: 12/09/20 2:11 PM PATIENT NAME: [name] [address] [place] [address]   [phone] (home) [phone] (work) DOB: 1926/02/17 MRN: [id number] PRIMARY CARE PROVIDER:    Sofie Hartigan, MD,  [place] [address] [phone]  REFERRING PROVIDER:   Sofie Carlson, [place],  [place] [address] [phone]  RESPONSIBLE PARTY:    Contact Information     Name Relation Home Work Mobile   [name] [name] Daughter   [place], [name] Granddaughter   [place] Son [phone]  [phone]        I met face to face with patient and family in  home. . Palliative Care was asked to follow this patient by consultation request of  Feldpausch, Chrissie Noa, MD to address advance care planning and complex medical decision making. This is a follow up visit.                                   ASSESSMENT AND PLAN / RECOMMENDATIONS:   Advance Care Planning/Goals of Care: Goals include to maximize quality of life and symptom management. Our advance care planning conversation included a discussion about:    Goals of care she states that a thyroid biopsy was discussed by her medical team. She's not interested in further diagnostics and treatments at her age,  she states. She does have an elevated parathyroid hormone which may be conservatively treated. However she is endorsing a very minimal intervention goal with her current quality of life for as long as she has it. She's not interested in extending life or going through anymore complex treatments. Her advanced directives are in vynca and include DNR and limited scope of intervention.   Symptom Management/Plan:   I met with patient in her home. It has been several months since our last visit. She endorses that she is improving. She  has not had any hospitalizations or health events.   Her daughter has moved out back into her apartment. She had been staying with her for some months. She has family in and out during the day and then another granddaughter spends the night. She is alert and oriented times three.   Phantom pains she endorses these continuing; it's been a year since her amputation and she still has severe phantom pains. I've recommended gabapentin which helps and encourage her to increase the dose and to take it daily but she does have some reticence to take medications. Her son was also present today and encouraged her to try the medication for the pain. I've also recommended that they call a chiropractor in [place] which does acupuncture as this may help. Lastly encouraged her to do her mirror therapy daily.  Mobility: We discussed her strengthening the left leg with weights around her ankle and leg raises on a daily basis. She endorses some difficulty in transferring due to weakness in her remaining leg.   Mood she states that she is doing pretty well other than the pain. She sometimes works on puzzles and listens to music. She enjoys family visits and her son comes in at lunchtime and they eat lunch together and visit. She looks forward to family visits on the weekends.    Glucose  She has  elevated  her A-1 C and she states now she's to take four units of her insulin. She is  not eating a large volume and I've encouraged her to eat more vegetables with her meals in light of low potassium level this week.   Follow up Palliative Care Visit: Palliative care will continue to follow for complex medical decision making, advance care planning, and clarification of goals. Return 8 weeks or prn.  I spent 60 minutes providing this consultation. More than 50% of the time in this consultation was spent in counseling and care coordination.  PPS: 40%  HOSPICE ELIGIBILITY/DIAGNOSIS: TBD  Chief Complaint:  debility  HISTORY OF PRESENT ILLNESS:  [name] is a 87 y.o. year old female  with DM, R leg amputation, elevated A1C, decreased potassium, chronic PLP.  History obtained from review of EMR, discussion with primary team, and interview with family, facility staff/caregiver and/or [name].  I reviewed available labs, medications, imaging, studies and related documents from the EMR.  Records reviewed and summarized above.   ROS   General: NAD EYES: denies vision changes, has glasses  ENMT: denies dysphagia Cardiovascular: denies chest pain, denies DOE Pulmonary: denies cough, denies increased SOB Abdomen: endorses fair  appetite, denies constipation, endorses continence of bowel GU: denies dysuria, endorses continence of urine MSK:  endorses  weakness,  no falls reported, has L foot podiatry recently. R AKA Skin: denies rashes or wounds Neurological: ongoing phantom  leg pain, endorses insomnia Psych: Endorses positive mood Heme/lymph/immuno: denies bruises, abnormal bleeding  Physical Exam: Current and past weights: stable  Constitutional: NAD General: frail appearing, thin EYES: anicteric sclera, lids intact, no discharge  ENMT: intact hearing, oral mucous membranes moist CV: no LE edema Pulmonary: no increased work of breathing, no cough, room air Abdomen: intake 50%, no ascites GU: deferred MSK: ++ sarcopenia, moves all extremities, non ambulatory Skin: warm and dry, no rashes or wounds on visible skin Neuro:  ++ generalized weakness,  no cognitive impairment Psych: non-anxious affect, A and O x 3 Hem/lymph/immuno: no widespread bruising   Thank you for the opportunity to participate in the care of [name].  The palliative care team will continue to follow. Please call our office at [phone] if we can be of additional assistance.   Kathleen Coop, NP   COVID-19 PATIENT SCREENING TOOL Asked and negative response unless otherwise noted:   Have you had  symptoms of covid, tested positive or been in contact with someone with symptoms/positive test in the past 5-10 days?

## 2021-02-04 ENCOUNTER — Emergency Department: Payer: Medicare Other

## 2021-02-04 ENCOUNTER — Emergency Department
Admission: EM | Admit: 2021-02-04 | Discharge: 2021-02-04 | Disposition: A | Discharge status: Home / Self Care | Payer: Medicare Other | Attending: Emergency Medicine | Admitting: Emergency Medicine

## 2021-02-04 ENCOUNTER — Encounter: Payer: Self-pay | Admitting: Emergency Medicine

## 2021-02-04 ENCOUNTER — Other Ambulatory Visit: Payer: Self-pay

## 2021-02-04 DIAGNOSIS — I130 Hypertensive heart and chronic kidney disease with heart failure and stage 1 through stage 4 chronic kidney disease, or unspecified chronic kidney disease: Secondary | ICD-10-CM | POA: Insufficient documentation

## 2021-02-04 DIAGNOSIS — Z7902 Long term (current) use of antithrombotics/antiplatelets: Secondary | ICD-10-CM | POA: Insufficient documentation

## 2021-02-04 DIAGNOSIS — I6782 Cerebral ischemia: Secondary | ICD-10-CM | POA: Insufficient documentation

## 2021-02-04 DIAGNOSIS — I509 Heart failure, unspecified: Secondary | ICD-10-CM | POA: Insufficient documentation

## 2021-02-04 DIAGNOSIS — Z20822 Contact with and (suspected) exposure to covid-19: Secondary | ICD-10-CM | POA: Diagnosis not present

## 2021-02-04 DIAGNOSIS — J320 Chronic maxillary sinusitis: Secondary | ICD-10-CM | POA: Diagnosis not present

## 2021-02-04 DIAGNOSIS — E1122 Type 2 diabetes mellitus with diabetic chronic kidney disease: Secondary | ICD-10-CM | POA: Diagnosis not present

## 2021-02-04 DIAGNOSIS — Z7984 Long term (current) use of oral hypoglycemic drugs: Secondary | ICD-10-CM | POA: Diagnosis not present

## 2021-02-04 DIAGNOSIS — R519 Headache, unspecified: Secondary | ICD-10-CM | POA: Diagnosis present

## 2021-02-04 DIAGNOSIS — N183 Chronic kidney disease, stage 3 unspecified: Secondary | ICD-10-CM | POA: Insufficient documentation

## 2021-02-04 DIAGNOSIS — Z7982 Long term (current) use of aspirin: Secondary | ICD-10-CM | POA: Diagnosis not present

## 2021-02-04 DIAGNOSIS — J0101 Acute recurrent maxillary sinusitis: Secondary | ICD-10-CM | POA: Insufficient documentation

## 2021-02-04 LAB — BASIC METABOLIC PANEL
Anion gap: 6 (ref 5–15)
BUN: 24 mg/dL — ABNORMAL HIGH (ref 8–23)
CO2: 31 mmol/L (ref 22–32)
Calcium: 8.6 mg/dL — ABNORMAL LOW (ref 8.9–10.3)
Chloride: 104 mmol/L (ref 98–111)
Creatinine, Ser: 1.33 mg/dL — ABNORMAL HIGH (ref 0.44–1.00)
GFR, Estimated: 37 mL/min — ABNORMAL LOW (ref >60)
Glucose, Bld: 122 mg/dL — ABNORMAL HIGH (ref 70–99)
Potassium: 3.1 mmol/L — ABNORMAL LOW (ref 3.5–5.1)
Sodium: 141 mmol/L (ref 135–145)

## 2021-02-04 LAB — CBC
HCT: 41.1 % (ref 36.0–46.0)
Hemoglobin: 14.8 g/dL (ref 12.0–15.0)
MCH: 33.4 pg (ref 26.0–34.0)
MCHC: 36 g/dL (ref 30.0–36.0)
MCV: 92.8 fL (ref 80.0–100.0)
Platelets: 200 10*3/uL (ref 150–400)
RBC: 4.43 MIL/uL (ref 3.87–5.11)
RDW: 12.1 % (ref 11.5–15.5)
WBC: 6.3 10*3/uL (ref 4.0–10.5)
nRBC: 0 % (ref 0.0–0.2)

## 2021-02-04 LAB — RESP PANEL BY RT-PCR (FLU A&B, COVID) ARPGX2
Influenza A by PCR: NEGATIVE
Influenza B by PCR: NEGATIVE
SARS Coronavirus 2 by RT PCR: NEGATIVE

## 2021-02-04 MED ORDER — ACETAMINOPHEN 500 MG PO TABS
1000.0000 mg | ORAL_TABLET | Freq: Once | ORAL | Status: AC
  Administered 2021-02-04: 1000 mg via ORAL
  Filled 2021-02-04: qty 2

## 2021-02-04 MED ORDER — AMOXICILLIN-POT CLAVULANATE 875-125 MG PO TABS: 1.0000 | ORAL_TABLET | Freq: Two times a day (BID) | ORAL | 0 refills | Status: AC

## 2021-02-04 MED ORDER — POTASSIUM CHLORIDE CRYS ER 20 MEQ PO TBCR
20.0000 meq | EXTENDED_RELEASE_TABLET | Freq: Once | ORAL | Status: AC
  Administered 2021-02-04: 20 meq via ORAL
  Filled 2021-02-04: qty 1

## 2021-02-04 NOTE — Discharge Instructions (Signed)
Take the antibiotic as prescribed  For your congestion. You can use OVER THE COUNTER Afrin/Oxymetazoline nasal decongestant spray, 2 sprays every 12 hours. Do NOT use this for more than 3 days at a time.   If your nose bleeds again:  Step 1: Blow your nose gently to remove any blood clots  Step 2: Apply FIRM, DIRECT PRESSURE to your NASAL BRIDGE (this is the flat part of your nose above your nostrils) for 5 minutes straight. Lean your head forward to help prevent blood from going down your throat.  Step 3: Remove pressure and see if your nose is still bleeding.  If it is, you can gently blow your nose again to remove any clots.  Step 4: Use the Afrin/oxymetazoline nasal spray that I have given you.  Spray 2 sprays into both nostrils.  Then, apply FIRM, DIRECT PRESSURE to your nasal bridge again for 5 minutes.  Step 5: If bleeding is not controlled, continue holding pressure and seek medical attention   Use a small amount of vaseline in your nostrils to help moisturize. You can also use over-the-counter SALINE SPRAY to help prevent your nose bleeds.

## 2021-02-04 NOTE — ED Provider Notes (Signed)
[facility] Emergency Department Provider Note  ____________________________________________   Event Date/Time   First MD Initiated Contact with Patient 02/04/21 [phone]     (approximate)  I have reviewed the triage vital signs and the nursing notes.   HISTORY  Chief Complaint Headache    HPI [name] is a 87 y.o. female with history of A. fib, CHF, hypertension, diabetes, here with headache.  The patient states that starting last night, she developed initially gradual onset then progressively worsening severe frontal headache.  She states that she has had an off-and-on headache for several days but this was significantly worse last night.  She describes it as a new headache that is not usual for her.  Denies any associated vision changes.  No associated weakness or numbness.  No facial droop.  No fevers or chills.  No neck pain or neck stiffness.  Denies history of headaches other than when she had a subdural after a fall which required surgery at [facility].  She has had no recent falls.  She does not take blood thinners now.    Past Medical History:  Diagnosis Date   A-fib ([place])    CHF (congestive heart failure) ([place])    Diabetes mellitus without complication ([place])    Hypertension     Patient Active Problem List   Diagnosis Date Noted   Palliative care by specialist    DNR (do not resuscitate)    Advanced directive placed in chart this admission    Advanced directives, counseling/discussion    Weakness    Goals of care, counseling/discussion    Protein-calorie malnutrition, severe 10/12/2019   Ischemia of right lower extremity 10/03/2019   Ischemia of extremity 07/31/2019   Proteinuria 04/23/2019   Atherosclerosis of native arteries of the extremities with ulceration ([place]) 03/01/2019   Chronic vertebral fracture due to osteoporosis ([place]) 05/20/2018   Closed T10 fracture ([place]) 05/20/2018   Gout 03/27/2018   Hypothyroidism 03/27/2018   Subdural  hematoma 03/08/2018   Abnormal SPEP 12/07/2017   History of vitamin D deficiency 11/30/2017   Hyperlipidemia associated with type 2 diabetes mellitus ([place]) 11/30/2017   PAD (peripheral artery disease) ([place]) 07/19/2017   Leg pain, lateral, left 07/19/2017   Diabetes ([place]) 07/19/2017   Hyperlipidemia 07/19/2017   Essential hypertension 07/19/2017   Mild aortic valve stenosis 06/22/2017   Persistent atrial fibrillation ([place]) 06/22/2017   Moderate mitral insufficiency 10/30/2015   Chronic kidney disease, stage 3 unspecified ([place]) 09/19/2014   Type II or unspecified type diabetes mellitus with renal manifestations, not stated as uncontrolled(250.40) 11/08/2013   Vitamin D deficiency 11/08/2013    Past Surgical History:  Procedure Laterality Date   AMPUTATION Right 10/10/2019   Procedure: AMPUTATION ABOVE KNEE;  Surgeon: Katha Cabal, MD;  Location: [facility] ORS;  Service: Vascular;  Laterality: Right;   CHOLECYSTECTOMY     LOWER EXTREMITY ANGIOGRAPHY Right 08/12/2017   Procedure: LOWER EXTREMITY ANGIOGRAPHY;  Surgeon: Katha Cabal, MD;  Location: [place] [facility];  Service: Cardiovascular;  Laterality: Right;   LOWER EXTREMITY ANGIOGRAPHY Right 06/13/2018   Procedure: LOWER EXTREMITY ANGIOGRAPHY;  Surgeon: Katha Cabal, MD;  Location: [place] [facility];  Service: Cardiovascular;  Laterality: Right;   LOWER EXTREMITY ANGIOGRAPHY Right 03/01/2019   Procedure: LOWER EXTREMITY ANGIOGRAPHY;  Surgeon: Katha Cabal, MD;  Location: [place] [facility];  Service: Cardiovascular;  Laterality: Right;   LOWER EXTREMITY ANGIOGRAPHY Right 07/31/2019   Procedure: LOWER EXTREMITY ANGIOGRAPHY;  Surgeon: Delana Meyer,  Dolores Lory, MD;  Location: [place] [facility];  Service: Cardiovascular;  Laterality: Right;   LOWER EXTREMITY ANGIOGRAPHY Right 10/04/2019   Procedure: Lower Extremity Angiography;  Surgeon: Katha Cabal, MD;  Location: [place] [facility];  Service: Cardiovascular;   Laterality: Right;    Prior to Admission medications   Medication Sig Start Date End Date Taking? Authorizing Provider  amoxicillin-clavulanate (AUGMENTIN) 875-125 MG tablet Take 1 tablet by mouth 2 (two) times daily for 10 days. 02/04/21 02/14/21 Yes Duffy Bruce, MD  ACCU-CHEK AVIVA PLUS test strip  04/24/19   [provider]  acetaminophen (TYLENOL) 325 MG tablet Take 325-650 mg by mouth every 6 (six) hours as needed for headache.     [provider]  allopurinol (ZYLOPRIM) 100 MG tablet Take 100 mg by mouth daily. Patient not taking: Reported on 12/01/2020 03/23/17   [provider]  aspirin EC 81 MG EC tablet Take 1 tablet (81 mg total) by mouth daily. 08/02/19   Stegmayer, Janalyn Harder, PA-C  calcium carbonate (OSCAL) 1500 (600 Ca) MG TABS tablet Take by mouth daily with breakfast.    [provider]  carvedilol (COREG) 6.25 MG tablet Take 6.25 mg by mouth 2 (two) times daily with a meal.  06/27/19   [provider]  clopidogrel (PLAVIX) 75 MG tablet TAKE (1) TABLET BY MOUTH EVERY DAY 11/10/20   Schnier, Dolores Lory, MD  furosemide (LASIX) 20 MG tablet Take 20 mg by mouth 2 (two) times daily.  03/07/17   [provider]  gabapentin (NEURONTIN) 100 MG capsule Take 100-200 mg by mouth at bedtime as needed (pain).     [provider]  gabapentin (NEURONTIN) 100 MG capsule Take 100 mg by mouth 3 (three) times daily.    Jason Coop, NP  glucose blood (ACCU-CHEK AVIVA PLUS) test strip CHECK BLOOD SUGAR 3 TIMES DAILY 02/14/19   [provider]  HUMALOG KWIKPEN 100 UNIT/ML KiwkPen Inject 3 Units into the skin 3 (three) times daily after meals. Per sliding scale 04/27/17   [provider]  HYDROcodone-acetaminophen (NORCO/VICODIN) 5-325 MG tablet Take 1 tablet by mouth every 4 (four) hours as needed for moderate pain. Patient not taking: No sig reported    [provider]  LEVEMIR FLEXTOUCH 100 UNIT/ML Pen  Inject 8 Units into the skin at bedtime.  06/15/17   [provider]  levothyroxine (SYNTHROID, LEVOTHROID) 88 MCG tablet Take 88 mcg by mouth daily before breakfast.  05/26/17   [provider]  lisinopril (ZESTRIL) 5 MG tablet Take 5 mg by mouth daily.  12/22/18   [provider]  LORazepam (ATIVAN) 0.5 MG tablet Take 0.5 mg by mouth every 6 (six) hours as needed for anxiety. Patient not taking: Reported on 12/01/2020    [provider]  lovastatin (MEVACOR) 20 MG tablet Take 20 mg by mouth daily with supper.  05/02/17   [provider]  meclizine (ANTIVERT) 25 MG tablet Take 25 mg by mouth 3 (three) times daily as needed for dizziness. 04/20/17   [provider]  mirtazapine (REMERON) 15 MG tablet Take by mouth. 11/21/19 12/01/20  [provider]  Multiple Vitamins-Minerals (PRESERVISION AREDS 2 PO) Take 1 tablet by mouth every evening.    [provider]  oxyCODONE (OXY IR/ROXICODONE) 5 MG immediate release tablet Take 1-2 tablets (5-10 mg total) by mouth every 4 (four) hours as needed for moderate pain. Patient not taking: No sig reported 10/25/19   Marcelle Overlie  A, PA-C  potassium chloride SA (K-DUR,KLOR-CON) 20 MEQ tablet Take 20 mEq by mouth daily.  Patient not taking: No sig reported    [provider]  Vitamin D3 (VITAMIN D) 25 MCG tablet Take 1,000 Units by mouth daily.     [provider]    Allergies Sulfa antibiotics  Family History  Problem Relation Age of Onset   Cancer Brother     Social History Social History   Tobacco Use   Smoking status: Never   Smokeless tobacco: Never  Vaping Use   Vaping Use: Never used  Substance Use Topics   Alcohol use: No   Drug use: No    Review of Systems  Review of Systems  Constitutional:  Positive for fatigue. Negative for fever.  HENT:  Negative for congestion and sore throat.   Eyes:  Negative for visual disturbance.  Respiratory:  Negative  for cough and shortness of breath.   Cardiovascular:  Negative for chest pain.  Gastrointestinal:  Negative for abdominal pain, diarrhea, nausea and vomiting.  Genitourinary:  Negative for flank pain.  Musculoskeletal:  Negative for back pain and neck pain.  Skin:  Negative for rash and wound.  Neurological:  Positive for weakness and headaches.    ____________________________________________  PHYSICAL EXAM:      VITAL SIGNS: ED Triage Vitals  Enc Vitals Group     BP 02/04/21 0404 (!) 194/80     Pulse Rate 02/04/21 0404 60     Resp 02/04/21 0404 16     Temp 02/04/21 0404 97.9 F (36.6 C)     Temp Source 02/04/21 0404 Oral     SpO2 02/04/21 0404 94 %     Weight 02/04/21 0407 107 lb (48.5 kg)     Height 02/04/21 0407 5\' 5"  (1.651 m)     Head Circumference --      Peak Flow --      Pain Score 02/04/21 0407 10     Pain Loc --      Pain Edu? --      Excl. in [place]? --      Physical Exam Vitals and nursing note reviewed.  Constitutional:      General: She is not in acute distress.    Appearance: She is well-developed.  HENT:     Head: Normocephalic and atraumatic.  Eyes:     Conjunctiva/sclera: Conjunctivae normal.  Cardiovascular:     Rate and Rhythm: Normal rate and regular rhythm.     Heart sounds: Normal heart sounds.  Pulmonary:     Effort: Pulmonary effort is normal. No respiratory distress.     Breath sounds: No wheezing.  Abdominal:     General: There is no distension.  Musculoskeletal:     Cervical back: Neck supple.  Skin:    General: Skin is warm.     Capillary Refill: Capillary refill takes less than 2 seconds.     Findings: No rash.  Neurological:     Mental Status: She is alert and oriented to person, place, and time.     Motor: No abnormal muscle tone.     Comments: Neurological Exam:  Mental Status: Alert and oriented to person, place, and time. Attention and concentration normal. Speech clear. Recent memory is intact. Cranial Nerves: Visual fields  grossly intact. EOMI and PERRLA. No nystagmus noted. Facial sensation intact at forehead, maxillary cheek, and chin/mandible bilaterally. No facial asymmetry or weakness. Hearing grossly normal. Uvula is midline, and palate elevates symmetrically.  Normal SCM and trapezius strength. Tongue midline without fasciculations. Motor: Muscle strength 5/5 in proximal and distal UE and LE bilaterally. No pronator drift. Muscle tone normal. Sensation: Intact to light touch in upper and lower extremities distally bilaterally.  Coordination: Normal FTN bilaterally.         ____________________________________________   LABS (all labs ordered are listed, but only abnormal results are displayed)  Labs Reviewed  BASIC METABOLIC PANEL - Abnormal; Notable for the following components:      Result Value   Potassium 3.1 (*)    Glucose, Bld 122 (*)    BUN 24 (*)    Creatinine, Ser 1.33 (*)    Calcium 8.6 (*)    GFR, Estimated 37 (*)    All other components within normal limits  RESP PANEL BY RT-PCR (FLU A&B, COVID) ARPGX2  CBC    ____________________________________________  EKG:  ________________________________________  RADIOLOGY All imaging, including plain films, CT scans, and ultrasounds, independently reviewed by me, and interpretations confirmed via formal radiology reads.  ED MD interpretation:   CT Head: NAICA, chronic sinusitis, no acute changes from prior  Official radiology report(s): CT Head Wo Contrast  Result Date: 02/04/2021 CLINICAL DATA:  87 year old female with history of new onset of headache for the past 3-4 days. EXAM: CT HEAD WITHOUT CONTRAST TECHNIQUE: Contiguous axial images were obtained from the base of the skull through the vertex without intravenous contrast. COMPARISON:  Head CT 10/08/2019. FINDINGS: Brain: Patchy and confluent areas of decreased attenuation are noted throughout the deep and periventricular white matter of the cerebral hemispheres bilaterally,  compatible with chronic microvascular ischemic disease. No evidence of acute infarction, hemorrhage, hydrocephalus, extra-axial collection or mass lesion/mass effect. Vascular: No hyperdense vessel or unexpected calcification. Skull: Postoperative changes of remote right pterional craniotomy. Negative for fracture or focal lesion. Sinuses/Orbits: No acute finding. Chronic mucoperiosteal thickening, high attenuation secretions and calcifications in the right maxillary sinus related to chronic sinusitis. Other: None. IMPRESSION: 1. No acute intracranial abnormalities. 2. Mild cerebral atrophy with chronic microvascular ischemic changes in the cerebral white matter, as above. 3. Chronic right maxillary sinusitis, similar to the prior study. Electronically Signed   By: Vinnie Langton M.D.   On: 02/04/2021 05:13   MR ANGIO HEAD WO CONTRAST  Result Date: 02/04/2021 CLINICAL DATA:  Headaches EXAM: MRI HEAD WITHOUT CONTRAST MRA HEAD WITHOUT CONTRAST TECHNIQUE: Multiplanar, multi-echo pulse sequences of the brain and surrounding structures were acquired without intravenous contrast. Angiographic images of the Circle of Willis were acquired using MRA technique without intravenous contrast. COMPARISON:  No pertinent prior exam. FINDINGS: MRI HEAD Brain: There is no acute infarction or intracranial hemorrhage. There is no intracranial mass, mass effect, or edema. There is no hydrocephalus or extra-axial fluid collection. Few scattered punctate foci of susceptibility likely reflect chronic microhemorrhages. Right superior cerebellar encephalomalacia with chronic blood products. Patchy and confluent T2 hyperintensity in the supratentorial white matter is nonspecific but probably reflects moderate chronic microvascular ischemic changes. Prominence of the ventricles and sulci reflects parenchymal volume loss. Disproportionate right medial temporal low volume loss. Vascular: Major vessel flow voids at the skull base are  preserved. Skull and upper cervical spine: Normal marrow signal is preserved. Prior right craniotomy. Sinuses/Orbits: Chronic right maxillary sinusitis. Bilateral lens replacements. Other: Sella is unremarkable.  Mastoid air cells are clear. MRA HEAD Intracranial internal carotid arteries are patent. Middle and anterior cerebral arteries are patent. Intracranial right vertebral artery is pain. Intracranial left vertebral artery flow void is significantly diminished.  Basilar artery is patent. Bilateral posterior communicating arteries are present. Posterior cerebral arteries are patent and primarily supplied by posterior communicating arteries. IMPRESSION: No acute abnormality. Chronic microvascular ischemic changes and parenchymal volume loss. Chronic right maxillary sinusitis. Significantly diminished intracranial left vertebral artery flow void. Age indeterminate but more likely chronic. May reflect proximal high-grade stenosis or occlusion. Electronically Signed   By: Macy Mis M.D.   On: 02/04/2021 11:21   MR BRAIN WO CONTRAST  Result Date: 02/04/2021 CLINICAL DATA:  Headaches EXAM: MRI HEAD WITHOUT CONTRAST MRA HEAD WITHOUT CONTRAST TECHNIQUE: Multiplanar, multi-echo pulse sequences of the brain and surrounding structures were acquired without intravenous contrast. Angiographic images of the Circle of Willis were acquired using MRA technique without intravenous contrast. COMPARISON:  No pertinent prior exam. FINDINGS: MRI HEAD Brain: There is no acute infarction or intracranial hemorrhage. There is no intracranial mass, mass effect, or edema. There is no hydrocephalus or extra-axial fluid collection. Few scattered punctate foci of susceptibility likely reflect chronic microhemorrhages. Right superior cerebellar encephalomalacia with chronic blood products. Patchy and confluent T2 hyperintensity in the supratentorial white matter is nonspecific but probably reflects moderate chronic microvascular  ischemic changes. Prominence of the ventricles and sulci reflects parenchymal volume loss. Disproportionate right medial temporal low volume loss. Vascular: Major vessel flow voids at the skull base are preserved. Skull and upper cervical spine: Normal marrow signal is preserved. Prior right craniotomy. Sinuses/Orbits: Chronic right maxillary sinusitis. Bilateral lens replacements. Other: Sella is unremarkable.  Mastoid air cells are clear. MRA HEAD Intracranial internal carotid arteries are patent. Middle and anterior cerebral arteries are patent. Intracranial right vertebral artery is pain. Intracranial left vertebral artery flow void is significantly diminished. Basilar artery is patent. Bilateral posterior communicating arteries are present. Posterior cerebral arteries are patent and primarily supplied by posterior communicating arteries. IMPRESSION: No acute abnormality. Chronic microvascular ischemic changes and parenchymal volume loss. Chronic right maxillary sinusitis. Significantly diminished intracranial left vertebral artery flow void. Age indeterminate but more likely chronic. May reflect proximal high-grade stenosis or occlusion. Electronically Signed   By: Macy Mis M.D.   On: 02/04/2021 11:21    ____________________________________________  PROCEDURES   Procedure(s) performed (including Critical Care):  Procedures  ____________________________________________  INITIAL IMPRESSION / MDM / [place] / ED COURSE  As part of my medical decision making, I reviewed the following data within the [facility] notes reviewed and incorporated, Old chart reviewed, Notes from prior ED visits, and [place] Controlled Substance Database       *Deziray Nabi Shepherd was evaluated in Emergency Department on 02/04/2021 for the symptoms described in the history of present illness. She was evaluated in the context of the global COVID-19 pandemic, which necessitated consideration  that the patient might be at risk for infection with the SARS-CoV-2 virus that causes COVID-19. Institutional protocols and algorithms that pertain to the evaluation of patients at risk for COVID-19 are in a state of rapid change based on information released by regulatory bodies including the CDC and federal and state organizations. These policies and algorithms were followed during the patient's care in the ED.  Some ED evaluations and interventions may be delayed as a result of limited staffing during the pandemic.*     Medical Decision Making:  87 yo F here with acute onset headache. No h/o frequent headaches. Neurologically intact on exam. CT head reviewed and is unremarkable. No focal deficits. Labs show normal WBC, BMP unremarkable. COVID is negative. Given that this is  new in onset and given her age, MR obtained, with MRA for eval of possible aneurysm not visible on CT. MR/MRA obtained, reviewed, and is overall reassuring. No aneurysms. No acute CVA. Pt does have R maxillary sinusitis as well. Pt HA did begin gradually and had no thunderclap onset or signs to suggest SAH.   Given negative MR, labs, and resolution in ED, no apparent emergent pathology identified. Pt does have sinusitis on MR and endorses a nosebleed last night, +congestion. Will tx for mild sinusitis, d/c with outpt follow-up and good return precautions.  ____________________________________________  FINAL CLINICAL IMPRESSION(S) / ED DIAGNOSES  Final diagnoses:  Acute recurrent maxillary sinusitis     MEDICATIONS GIVEN DURING THIS VISIT:  Medications  potassium chloride SA (KLOR-CON) CR tablet 20 mEq (20 mEq Oral Given 02/04/21 0849)  acetaminophen (TYLENOL) tablet 1,000 mg (1,000 mg Oral Given 02/04/21 0849)     ED Discharge Orders          Ordered    amoxicillin-clavulanate (AUGMENTIN) 875-125 MG tablet  2 times daily        02/04/21 1136             Note:  This document was prepared using Dragon voice  recognition software and may include unintentional dictation errors.   Duffy Bruce, MD 02/04/21 [phone]

## 2021-02-04 NOTE — ED Triage Notes (Signed)
Pt to triage via w/c with no distress noted; st frontal HA x 3-4 days with no accomp symptoms unrelieved by tylenol; st hx of pain

## 2021-02-04 NOTE — ED Notes (Signed)
Taken to MRI 

## 2021-02-05 ENCOUNTER — Other Ambulatory Visit: Payer: Medicare Other | Admitting: Primary Care

## 2021-02-16 ENCOUNTER — Other Ambulatory Visit: Payer: Medicare Other | Admitting: Primary Care

## 2021-02-16 ENCOUNTER — Other Ambulatory Visit: Payer: Self-pay

## 2021-02-16 DIAGNOSIS — I739 Peripheral vascular disease, unspecified: Secondary | ICD-10-CM

## 2021-02-16 DIAGNOSIS — E1151 Type 2 diabetes mellitus with diabetic peripheral angiopathy without gangrene: Secondary | ICD-10-CM

## 2021-02-16 DIAGNOSIS — Z515 Encounter for palliative care: Secondary | ICD-10-CM

## 2021-02-16 DIAGNOSIS — M79605 Pain in left leg: Secondary | ICD-10-CM

## 2021-02-16 DIAGNOSIS — Z794 Long term (current) use of insulin: Secondary | ICD-10-CM

## 2021-02-16 DIAGNOSIS — G4700 Insomnia, unspecified: Secondary | ICD-10-CM | POA: Insufficient documentation

## 2021-02-16 DIAGNOSIS — R531 Weakness: Secondary | ICD-10-CM

## 2021-02-16 NOTE — Progress Notes (Signed)
[facility] Palliative Care Consult Note Telephone: [phone]  Fax: [phone]    Date of encounter: 02/16/21 3:17 PM PATIENT NAME: [name] [address] [place] [address]   [phone] (home) [phone] (work) DOB: April 15, 1925 MRN: [id number] PRIMARY CARE PROVIDER:    Sofie Hartigan, MD,  [place] [address] [phone]  REFERRING PROVIDER:   Sofie Hartigan, [place],  [place] [address] [phone]  RESPONSIBLE PARTY:    Contact Information     Name Relation Home Work Mobile   Driscilla Grammes [facility] Daughter   [place], Nira Conn Granddaughter   [place] Son [phone]  [phone]        I met face to face with patient and family in  home.  Palliative Care was asked to follow this patient by consultation request of  Feldpausch, Chrissie Noa, MD to address advance care planning and complex medical decision making. This is a follow up visit.                                   ASSESSMENT AND PLAN / RECOMMENDATIONS:   Advance Care Planning/Goals of Care: Goals include to maximize quality of life and symptom management. Our advance care planning conversation included a discussion about:    The value and importance of advance care planning  Exploration of personal, cultural or spiritual beliefs that might influence medical decisions  Exploration of goals of care in the event of a sudden injury or illness  Identification and preparation of a healthcare agent - [name] Review  of an  advance directive document. CODE STATUS: DNR  Symptom Management/Plan:  Insomnia: Endorses distressing insomnia.  Restart melatonin 6 mg, and change gabapentin to lyrica 50 mg po bid for LE pains. Sometimes worries and has trouble sleeping. Outlined her daughter has had surgery. Encouraged not to nap during day if possible. She has her granddaughter with her at night so she is not  alone.  Phantom Pain: Continues to be an issue, gabapentin not really addressing, I am changing to lyrica as a trial. Sent lyrica 50 mg bid #60 with 2 rf to [employer]. Instructed her and son to hold gabapentin during trial.  Renal function: GFR 37 currently. Does not want to consider dialysis due to age.  Drinks water.  Last A1C = 7.2%.   Diabetes: 150 mg/dl, high for patient. A1C 7.2%  Her endocrine provider left and she is having to wait an extra 2 months for f/u for her assessment.  She endorses good compliance with diet. Recently finished abx course for sinusitis.   Follow up Palliative Care Visit: Palliative care will continue to follow for complex medical decision making, advance care planning, and clarification of goals. Return 8 weeks or prn.  I spent 60 minutes providing this consultation. More than 50% of the time in this consultation was spent in counseling and care coordination.  PPS: 40%  HOSPICE ELIGIBILITY/DIAGNOSIS: TBD  Chief Complaint: insomnia  HISTORY OF PRESENT ILLNESS:  [name] is a 87 y.o. year old female  with DM, vascular disease, LE pain, insomnia .   History obtained from review of EMR, discussion with primary team, and interview with family, facility staff/caregiver and/or Ms. Tucci.  I reviewed available labs, medications, imaging, studies and related documents from the EMR.  Records reviewed and summarized  above.   ROS   General: NAD EYES: denies vision changes, has glasses  ENMT: denies dysphagia Cardiovascular: denies chest pain, denies DOE Pulmonary: denies cough, denies increased SOB Abdomen: endorses good appetite, denies constipation, endorses continence of bowel GU: denies dysuria, endorses continence of urine MSK:  endorses  weakness,  no falls reported Skin: denies rashes or wounds Neurological: endorses neuropathic  pain, endorses insomnia Psych: Endorses positive mood but tired Heme/lymph/immuno: denies bruises, abnormal  bleeding  Physical Exam: Current and past weights:Stable , 109 lbs Constitutional: NAD General: frail appearing, thin EYES: anicteric sclera, lids intact, no discharge  ENMT: intact hearing, oral mucous membranes moist CV: no LE edema Pulmonary:  no increased work of breathing, no cough, room air Abdomen: intake 75%,  no ascites GU: deferred MSK: ++ sarcopenia, moves all extremities,  non ambulatory, R AKA Skin: warm and dry, no rashes or wounds on visible skin Neuro:  + generalized weakness,  no cognitive impairment Psych: non-anxious affect, A and O x 3 Hem/lymph/immuno: no widespread bruising   Thank you for the opportunity to participate in the care of Ms. Portales.  The palliative care team will continue to follow. Please call our office at [phone] if we can be of additional assistance.   Jason Coop, NP DNP, AGPCNP-BC  COVID-19 PATIENT SCREENING TOOL Asked and negative response unless otherwise noted:   Have you had symptoms of covid, tested positive or been in contact with someone with symptoms/positive test in the past 5-10 days?

## 2021-03-10 IMAGING — DX DG CHEST 1V PORT
1 series · 1 of 1 positions shown · non-contrast
Comparison: 05/30/2017

CLINICAL DATA: Hypoxia.

EXAM:
PORTABLE CHEST 1 VIEW

[chest ap]
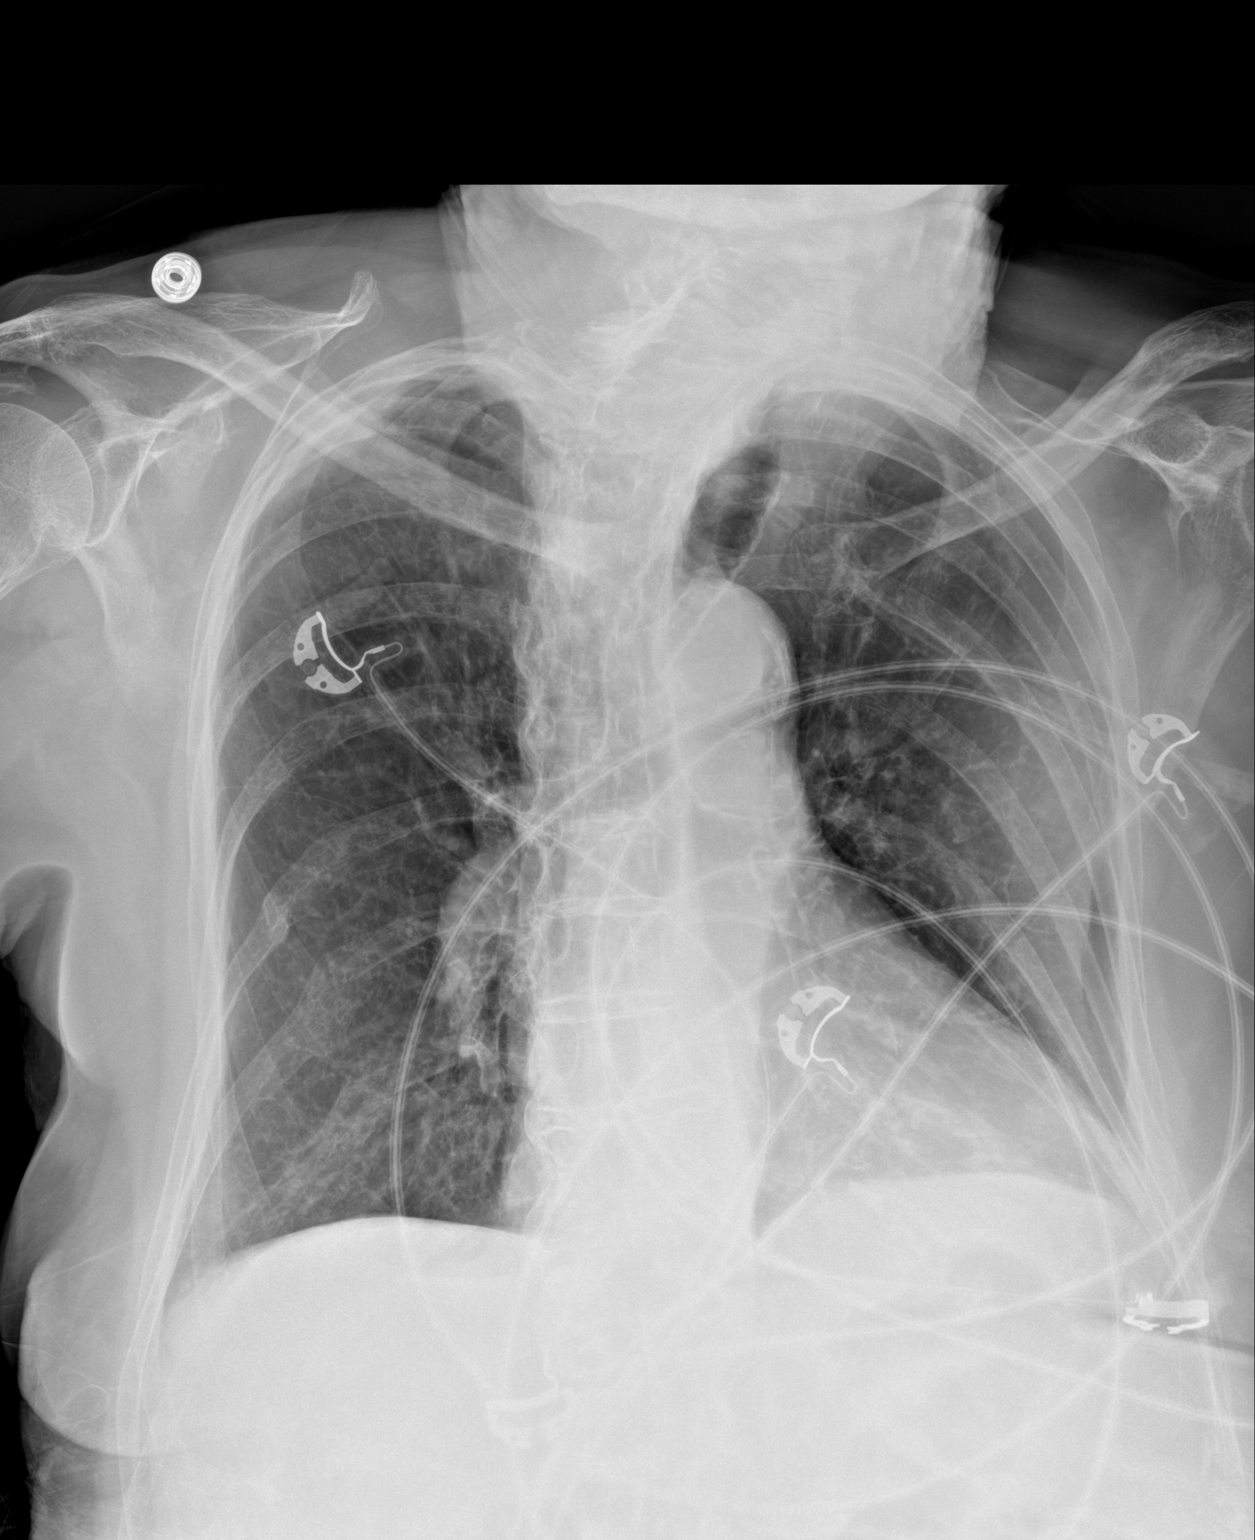

[1 of 1 positions shown; findings below may reference images not displayed]

FINDINGS: Patient is rotated to the left. Lungs are adequately inflated
without consolidation or effusion. Cardiomediastinal silhouette and
remainder of the exam is unchanged. Old right rib fractures.
IMPRESSION: No active disease.

## 2021-03-19 IMAGING — DX DG CHEST 1V PORT
1 series · 1 of 1 positions shown · non-contrast
Comparison: October 14, 2019

CLINICAL DATA: Shortness of breath

EXAM:
PORTABLE CHEST 1 VIEW

[chest ap]
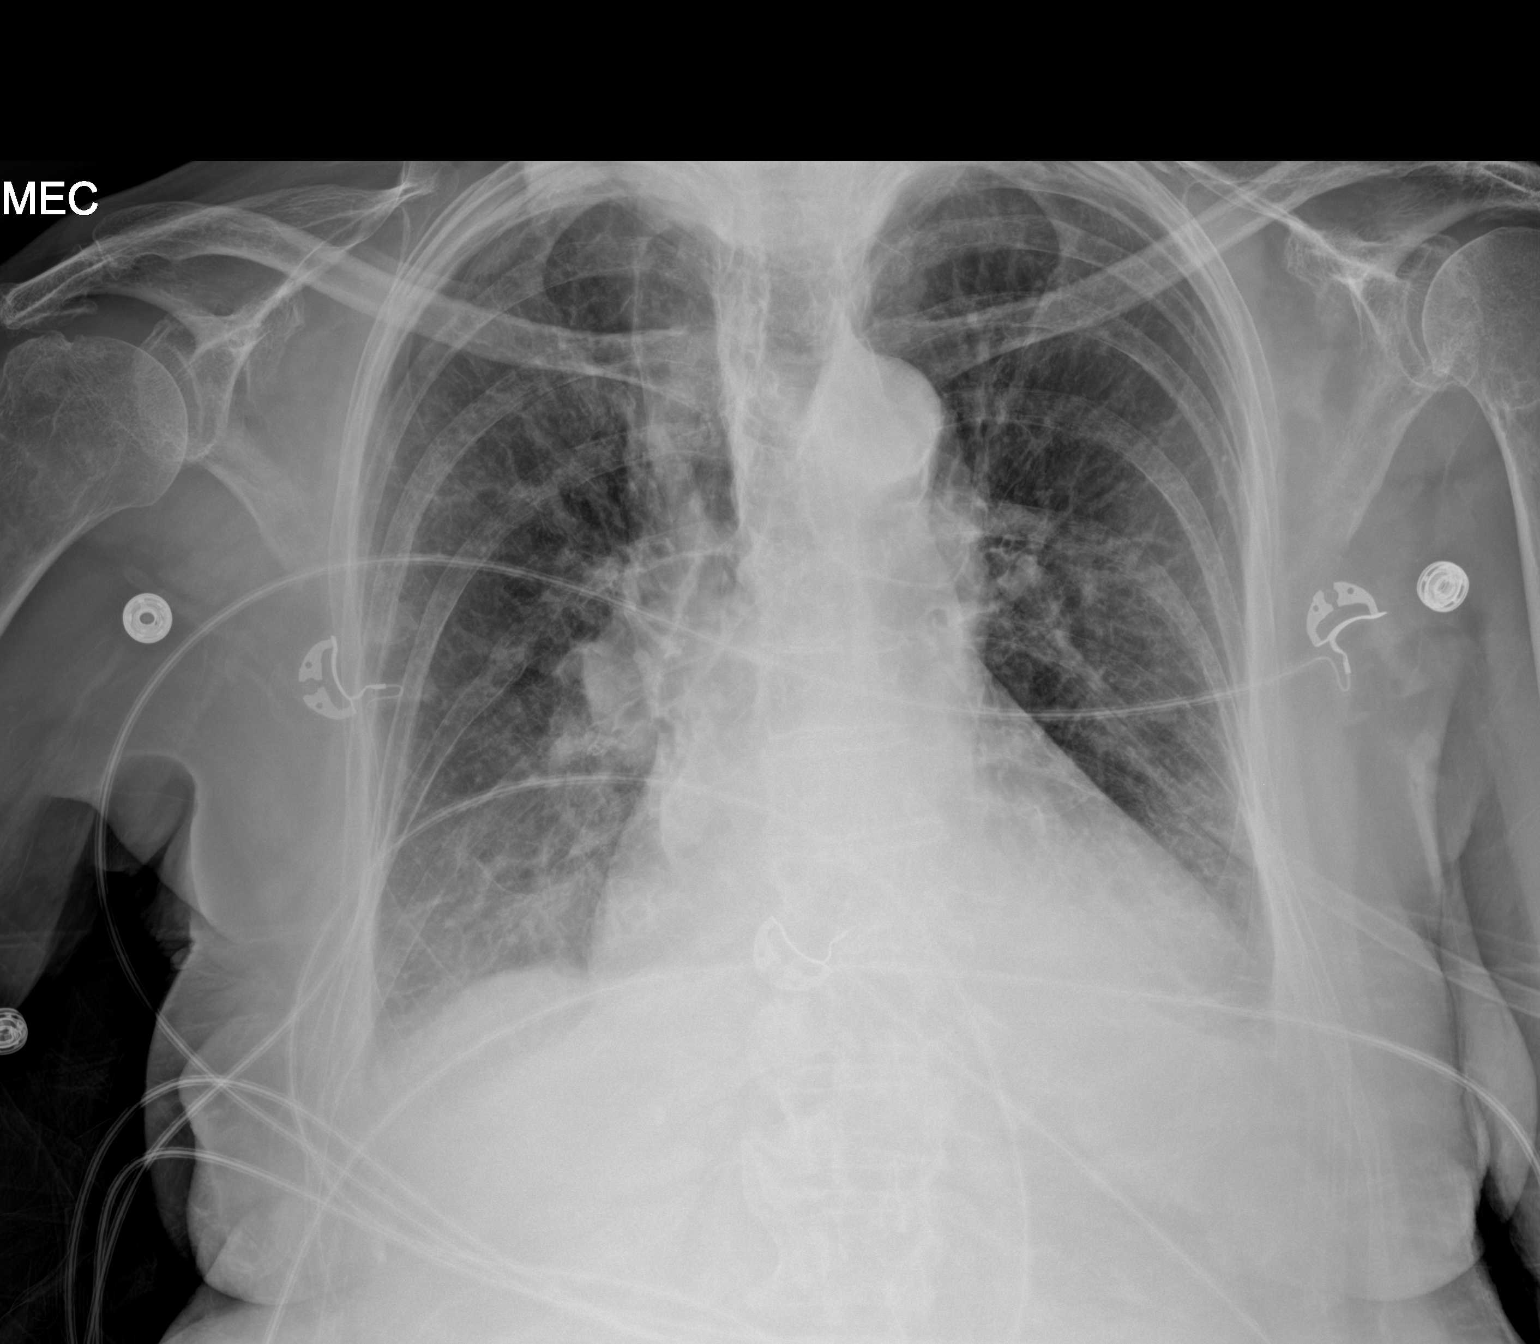

[1 of 1 positions shown; findings below may reference images not displayed]

FINDINGS: There is focal consolidation in the left base with minimal left
pleural effusion. There is slight right base atelectasis. There is
mild scarring in the medial right upper lobe. Lungs otherwise are
clear. There is cardiomegaly with pulmonary vascularity normal. No
adenopathy. There is aortic atherosclerosis. No bone lesions.
IMPRESSION: Airspace opacity concerning for pneumonia left base with minimal
left pleural effusion. Areas of mild scarring and atelectasis on the
right. Stable cardiomegaly. No adenopathy. Aortic Atherosclerosis
([id number]).

## 2021-04-14 ENCOUNTER — Inpatient Hospital Stay: Payer: Medicare Other

## 2021-04-14 ENCOUNTER — Emergency Department: Payer: Medicare Other

## 2021-04-14 ENCOUNTER — Inpatient Hospital Stay
Admission: EM | Admit: 2021-04-14 | Discharge: 2021-04-25 | DRG: 061 | Disposition: A | Discharge status: Skilled Nursing Facility | Payer: Medicare Other | Attending: Family Medicine | Admitting: Family Medicine

## 2021-04-14 DIAGNOSIS — Z79890 Hormone replacement therapy (postmenopausal): Secondary | ICD-10-CM

## 2021-04-14 DIAGNOSIS — R0602 Shortness of breath: Secondary | ICD-10-CM

## 2021-04-14 DIAGNOSIS — G4734 Idiopathic sleep related nonobstructive alveolar hypoventilation: Secondary | ICD-10-CM | POA: Diagnosis not present

## 2021-04-14 DIAGNOSIS — Z993 Dependence on wheelchair: Secondary | ICD-10-CM

## 2021-04-14 DIAGNOSIS — I739 Peripheral vascular disease, unspecified: Secondary | ICD-10-CM | POA: Diagnosis present

## 2021-04-14 DIAGNOSIS — I160 Hypertensive urgency: Secondary | ICD-10-CM | POA: Diagnosis present

## 2021-04-14 DIAGNOSIS — E1122 Type 2 diabetes mellitus with diabetic chronic kidney disease: Secondary | ICD-10-CM | POA: Diagnosis present

## 2021-04-14 DIAGNOSIS — Z9049 Acquired absence of other specified parts of digestive tract: Secondary | ICD-10-CM

## 2021-04-14 DIAGNOSIS — K5900 Constipation, unspecified: Secondary | ICD-10-CM

## 2021-04-14 DIAGNOSIS — I6340 Cerebral infarction due to embolism of unspecified cerebral artery: Principal | ICD-10-CM | POA: Diagnosis present

## 2021-04-14 DIAGNOSIS — Z89611 Acquired absence of right leg above knee: Secondary | ICD-10-CM | POA: Diagnosis not present

## 2021-04-14 DIAGNOSIS — E039 Hypothyroidism, unspecified: Secondary | ICD-10-CM | POA: Diagnosis present

## 2021-04-14 DIAGNOSIS — Z794 Long term (current) use of insulin: Secondary | ICD-10-CM | POA: Diagnosis not present

## 2021-04-14 DIAGNOSIS — Z882 Allergy status to sulfonamides status: Secondary | ICD-10-CM | POA: Diagnosis not present

## 2021-04-14 DIAGNOSIS — Z66 Do not resuscitate: Secondary | ICD-10-CM | POA: Diagnosis present

## 2021-04-14 DIAGNOSIS — R0902 Hypoxemia: Secondary | ICD-10-CM | POA: Diagnosis not present

## 2021-04-14 DIAGNOSIS — Z7982 Long term (current) use of aspirin: Secondary | ICD-10-CM | POA: Diagnosis not present

## 2021-04-14 DIAGNOSIS — Z7902 Long term (current) use of antithrombotics/antiplatelets: Secondary | ICD-10-CM

## 2021-04-14 DIAGNOSIS — E1151 Type 2 diabetes mellitus with diabetic peripheral angiopathy without gangrene: Secondary | ICD-10-CM | POA: Diagnosis present

## 2021-04-14 DIAGNOSIS — I129 Hypertensive chronic kidney disease with stage 1 through stage 4 chronic kidney disease, or unspecified chronic kidney disease: Secondary | ICD-10-CM | POA: Diagnosis present

## 2021-04-14 DIAGNOSIS — Z20822 Contact with and (suspected) exposure to covid-19: Secondary | ICD-10-CM | POA: Diagnosis present

## 2021-04-14 DIAGNOSIS — E1169 Type 2 diabetes mellitus with other specified complication: Secondary | ICD-10-CM | POA: Diagnosis not present

## 2021-04-14 DIAGNOSIS — Z515 Encounter for palliative care: Secondary | ICD-10-CM | POA: Diagnosis not present

## 2021-04-14 DIAGNOSIS — I639 Cerebral infarction, unspecified: Secondary | ICD-10-CM

## 2021-04-14 DIAGNOSIS — Z79899 Other long term (current) drug therapy: Secondary | ICD-10-CM

## 2021-04-14 DIAGNOSIS — R4701 Aphasia: Secondary | ICD-10-CM | POA: Diagnosis present

## 2021-04-14 DIAGNOSIS — N1832 Chronic kidney disease, stage 3b: Secondary | ICD-10-CM | POA: Diagnosis present

## 2021-04-14 DIAGNOSIS — R29810 Facial weakness: Secondary | ICD-10-CM | POA: Diagnosis present

## 2021-04-14 DIAGNOSIS — G546 Phantom limb syndrome with pain: Secondary | ICD-10-CM | POA: Diagnosis present

## 2021-04-14 DIAGNOSIS — N184 Chronic kidney disease, stage 4 (severe): Secondary | ICD-10-CM | POA: Insufficient documentation

## 2021-04-14 DIAGNOSIS — E785 Hyperlipidemia, unspecified: Secondary | ICD-10-CM | POA: Diagnosis present

## 2021-04-14 DIAGNOSIS — H5347 Heteronymous bilateral field defects: Secondary | ICD-10-CM | POA: Diagnosis present

## 2021-04-14 DIAGNOSIS — Z23 Encounter for immunization: Secondary | ICD-10-CM

## 2021-04-14 DIAGNOSIS — I10 Essential (primary) hypertension: Secondary | ICD-10-CM

## 2021-04-14 DIAGNOSIS — N189 Chronic kidney disease, unspecified: Secondary | ICD-10-CM | POA: Diagnosis present

## 2021-04-14 DIAGNOSIS — I482 Chronic atrial fibrillation, unspecified: Secondary | ICD-10-CM | POA: Diagnosis present

## 2021-04-14 DIAGNOSIS — E876 Hypokalemia: Secondary | ICD-10-CM | POA: Diagnosis not present

## 2021-04-14 DIAGNOSIS — G9341 Metabolic encephalopathy: Secondary | ICD-10-CM | POA: Diagnosis present

## 2021-04-14 DIAGNOSIS — N179 Acute kidney failure, unspecified: Secondary | ICD-10-CM | POA: Diagnosis present

## 2021-04-14 DIAGNOSIS — I63432 Cerebral infarction due to embolism of left posterior cerebral artery: Secondary | ICD-10-CM | POA: Diagnosis not present

## 2021-04-14 DIAGNOSIS — E119 Type 2 diabetes mellitus without complications: Secondary | ICD-10-CM

## 2021-04-14 LAB — COMPREHENSIVE METABOLIC PANEL
ALT: 13 U/L (ref 0–44)
AST: 20 U/L (ref 15–41)
Albumin: 3.8 g/dL (ref 3.5–5.0)
Alkaline Phosphatase: 91 U/L (ref 38–126)
Anion gap: 9 (ref 5–15)
BUN: 23 mg/dL (ref 8–23)
CO2: 30 mmol/L (ref 22–32)
Calcium: 9.5 mg/dL (ref 8.9–10.3)
Chloride: 101 mmol/L (ref 98–111)
Creatinine, Ser: 1.43 mg/dL — ABNORMAL HIGH (ref 0.44–1.00)
GFR, Estimated: 34 mL/min — ABNORMAL LOW (ref >60)
Glucose, Bld: 180 mg/dL — ABNORMAL HIGH (ref 70–99)
Potassium: 3 mmol/L — ABNORMAL LOW (ref 3.5–5.1)
Sodium: 140 mmol/L (ref 135–145)
Total Bilirubin: 0.8 mg/dL (ref 0.3–1.2)
Total Protein: 8 g/dL (ref 6.5–8.1)

## 2021-04-14 LAB — CBC
HCT: 46.1 % — ABNORMAL HIGH (ref 36.0–46.0)
Hemoglobin: 16.1 g/dL — ABNORMAL HIGH (ref 12.0–15.0)
MCH: 32.3 pg (ref 26.0–34.0)
MCHC: 34.9 g/dL (ref 30.0–36.0)
MCV: 92.4 fL (ref 80.0–100.0)
Platelets: 188 10*3/uL (ref 150–400)
RBC: 4.99 MIL/uL (ref 3.87–5.11)
RDW: 12 % (ref 11.5–15.5)
WBC: 6.6 10*3/uL (ref 4.0–10.5)
nRBC: 0 % (ref 0.0–0.2)

## 2021-04-14 LAB — DIFFERENTIAL
Abs Immature Granulocytes: 0.02 10*3/uL (ref 0.00–0.07)
Basophils Absolute: 0 10*3/uL (ref 0.0–0.1)
Basophils Relative: 1 %
Eosinophils Absolute: 0.1 10*3/uL (ref 0.0–0.5)
Eosinophils Relative: 2 %
Immature Granulocytes: 0 %
Lymphocytes Relative: 23 %
Lymphs Abs: 1.5 10*3/uL (ref 0.7–4.0)
Monocytes Absolute: 0.7 10*3/uL (ref 0.1–1.0)
Monocytes Relative: 10 %
Neutro Abs: 4.3 10*3/uL (ref 1.7–7.7)
Neutrophils Relative %: 64 %

## 2021-04-14 LAB — HEMOGLOBIN A1C
Hgb A1c MFr Bld: 7.7 % — ABNORMAL HIGH (ref 4.8–5.6)
Mean Plasma Glucose: 174.29 mg/dL

## 2021-04-14 LAB — CBG MONITORING, ED
Glucose-Capillary: 158 mg/dL — ABNORMAL HIGH (ref 70–99)
Glucose-Capillary: 159 mg/dL — ABNORMAL HIGH (ref 70–99)

## 2021-04-14 LAB — PROTIME-INR
INR: 1.1 (ref 0.8–1.2)
Prothrombin Time: 14.1 seconds (ref 11.4–15.2)

## 2021-04-14 LAB — RESP PANEL BY RT-PCR (FLU A&B, COVID) ARPGX2
Influenza A by PCR: NEGATIVE
Influenza B by PCR: NEGATIVE
SARS Coronavirus 2 by RT PCR: NEGATIVE

## 2021-04-14 LAB — LIPID PANEL
Cholesterol: 211 mg/dL — ABNORMAL HIGH (ref 0–200)
HDL: 54 mg/dL (ref >40)
LDL Cholesterol: 110 mg/dL — ABNORMAL HIGH (ref 0–99)
Total CHOL/HDL Ratio: 3.9 RATIO
Triglycerides: 236 mg/dL — ABNORMAL HIGH (ref <150)
VLDL: 47 mg/dL — ABNORMAL HIGH (ref 0–40)

## 2021-04-14 LAB — APTT: aPTT: 30 seconds (ref 24–36)

## 2021-04-14 MED ORDER — IOHEXOL 350 MG/ML SOLN
75.0000 mL | Freq: Once | INTRAVENOUS | Status: AC | PRN
  Administered 2021-04-14: 75 mL via INTRAVENOUS

## 2021-04-14 MED ORDER — DOCUSATE SODIUM 100 MG PO CAPS: 100.0000 mg | ORAL_CAPSULE | Freq: Two times a day (BID) | ORAL | Status: DC | PRN

## 2021-04-14 MED ORDER — POTASSIUM CHLORIDE 10 MEQ/100ML IV SOLN: 10.0000 meq | INTRAVENOUS | Status: AC

## 2021-04-14 MED ORDER — POLYETHYLENE GLYCOL 3350 17 G PO PACK: 17.0000 g | PACK | Freq: Every day | ORAL | Status: DC | PRN

## 2021-04-14 MED ORDER — CLEVIDIPINE BUTYRATE 0.5 MG/ML IV EMUL
0.0000 mg/h | INTRAVENOUS | Status: DC
  Administered 2021-04-14 (×2): 4 mg/h via INTRAVENOUS
  Administered 2021-04-14: 2 mg/h via INTRAVENOUS
  Administered 2021-04-15: 4 mg/h via INTRAVENOUS
  Filled 2021-04-14 (×4): qty 50

## 2021-04-14 MED ORDER — ONDANSETRON HCL 4 MG/2ML IJ SOLN
4.0000 mg | Freq: Four times a day (QID) | INTRAMUSCULAR | Status: DC | PRN
  Administered 2021-04-15: 4 mg via INTRAVENOUS
  Filled 2021-04-14: qty 2

## 2021-04-14 MED ORDER — SODIUM CHLORIDE 0.9% FLUSH: 3.0000 mL | Freq: Once | INTRAVENOUS | Status: DC

## 2021-04-14 MED ORDER — TENECTEPLASE FOR STROKE
0.2500 mg/kg | PACK | Freq: Once | INTRAVENOUS | Status: AC
  Administered 2021-04-14: 16 mg via INTRAVENOUS

## 2021-04-14 NOTE — ED Notes (Signed)
Neurology at bedside.

## 2021-04-14 NOTE — H&P (Signed)
NAME:  [name], MRN:  [id number], DOB:  04/07/1926, LOS: 0 ADMISSION DATE:  04/14/2021, CONSULTATION DATE:  04/14/2021 REFERRING MD:  ED, CHIEF COMPLAINT:  Aphasia   History of Present Illness:  88yo female w/ a past medical of atrial fibrillation not on anticoagulation due to fall risk, PAD s/p RLE amputation, hypothyroidism, type II diabetes and hypertension who presented to the ED this afternoon with acute onset of aphasia that initially improved but then around 1330 began again and was brought to the ED and a code stroke was initiated.   CT CODE Stroke with no hemorrhage or LVO. Patient does have a history of prior SDH following a fall years back. After discussion between neurology team and family, decision was made to move forward with TNK administration.   Critical care was called for ICU admission. Patient seen and examined in the ED. TNK infusing. VSS. Patient is alert but w/expressive aphasia. Attempted to state name but then shook head no that she could not speak. Able to follow commands weakly. Difficult to obtain full NIHSS given degree of symptoms. Patients son and daughter at bedside. Son states she has been in her usual state of health and did 3 loads of laundry today. He states baseline she is independent with no neurological deficits.   Chart reviewed. Significant lab values include: K 3, Cr 1.43, Glucose 180  Pertinent  Medical History  PAD RLE amputation Hypertension Hyperlipidemia Atrial fibrillation  Significant Hospital Events: Including procedures, antibiotic start and stop dates in addition to other pertinent events   1/3 CODE Stroke s/p TNK Administration  Interim History / Subjective:  88yo female who presented 1/3 with stroke like symptoms including aphasia s/p TNK administration.   Objective   Blood pressure (!) 142/65, pulse 64, temperature 98.1 F (36.7 C), temperature source Oral, resp. rate (!) 23, weight 62.1 kg, SpO2 94 %.       No intake or  output data in the 24 hours ending 04/14/21 1726 Filed Weights   04/14/21 1604  Weight: 62.1 kg    Examination: General: Alert but aphasic HENT: Pupils equal and reactive Lungs: Lungs clear Cardiovascular: Irregular rhythm Abdomen: Soft, non-tender Extremities: RLE amputation Neuro: Alert but aphasic. Able to follow commands weakly  Resolved Hospital Problem list   NA  Assessment & Plan:  Acute Ischemic Stroke S/P TNK --Suspect cardioembolic given known afib not on anticoagulation --Neurology following --Post TPA protocol --Frequent neuro checks --SBP <180 --Stroke workup (Lipid profile, HgbA1c, ECHO) --Repeat CTH 24hr post TNK --MRI pending --PT/OT/Speech to evaluate  Hypertension Hyperlipidemia --Home medications: ASA, lisinopril, lovastatin, coreg --Hold home medications for now  PAD --Home medications: Plavix --Hold for now s/p lytics  Hypothyroidism --Continue home levothyroxine 31mcg  Type II Diabetes --SSI  Hypokalemia --K 3.0 --KCL 40 meq ordered IV  Best Practice (right click and "Reselect all SmartList Selections" daily)   Diet/type: NPO DVT prophylaxis: other GI prophylaxis: N/A Lines: N/A Foley:  N/A Code Status:  DNR Last date of multidisciplinary goals of care discussion [Patients daughter and son updated at bedside]  Labs   CBC: Recent Labs  Lab 04/14/21 1600  WBC 6.6  NEUTROABS 4.3  HGB 16.1*  HCT 46.1*  MCV 92.4  PLT 263    Basic Metabolic Panel: Recent Labs  Lab 04/14/21 1600  NA 140  K 3.0*  CL 101  CO2 30  GLUCOSE 180*  BUN 23  CREATININE 1.43*  CALCIUM 9.5   GFR: Estimated Creatinine Clearance: 22.1 mL/min (  A) (by C-G formula based on SCr of 1.43 mg/dL (H)). Recent Labs  Lab 04/14/21 1600  WBC 6.6    Liver Function Tests: Recent Labs  Lab 04/14/21 1600  AST 20  ALT 13  ALKPHOS 91  BILITOT 0.8  PROT 8.0  ALBUMIN 3.8   No results for input(s): LIPASE, AMYLASE in the last 168 hours. No results  for input(s): AMMONIA in the last 168 hours.  ABG No results found for: PHART, PCO2ART, PO2ART, HCO3, TCO2, ACIDBASEDEF, O2SAT   Coagulation Profile: Recent Labs  Lab 04/14/21 1600  INR 1.1    Cardiac Enzymes: No results for input(s): CKTOTAL, CKMB, CKMBINDEX, TROPONINI in the last 168 hours.  HbA1C: Hgb A1c MFr Bld  Date/Time Value Ref Range Status  03/01/2019 06:41 PM 6.2 (H) 4.8 - 5.6 % Final    Comment:    (NOTE) Pre diabetes:          5.7%-6.4% Diabetes:              >6.4% Glycemic control for   <7.0% adults with diabetes     CBG: Recent Labs  Lab 04/14/21 1532 04/14/21 1554  GLUCAP 159* 158*    Review of Systems:   Unable to assess due to condition  Past Medical History:  She,  has a past medical history of A-fib ([place]), CHF (congestive heart failure) ([place]), Diabetes mellitus without complication ([place]), and Hypertension.   Surgical History:   Past Surgical History:  Procedure Laterality Date   AMPUTATION Right 10/10/2019   Procedure: AMPUTATION ABOVE KNEE;  Surgeon: Katha Cabal, MD;  Location: [facility];  Service: Vascular;  Laterality: Right;   CHOLECYSTECTOMY     LOWER EXTREMITY ANGIOGRAPHY Right 08/12/2017   Procedure: LOWER EXTREMITY ANGIOGRAPHY;  Surgeon: Katha Cabal, MD;  Location[facility];  Service: Cardiovascular;  Laterality: Right;   LOWER EXTREMITY ANGIOGRAPHY Right 06/13/2018   Procedure: LOWER EXTREMITY ANGIOGRAPHY;  Surgeon: Katha Cabal, MD;  Location: [facility];  Service: Cardiovascular;  Laterality: Right;   LOWER EXTREMITY ANGIOGRAPHY Right 03/01/2019   Procedure: LOWER EXTREMITY ANGIOGRAPHY;  Surgeon: Katha Cabal, MD;  Location: [facility];  Service: Cardiovascular;  Laterality: Right;   LOWER EXTREMITY ANGIOGRAPHY Right 07/31/2019   Procedure: LOWER EXTREMITY ANGIOGRAPHY;  Surgeon: Katha Cabal, MD;  Location: [facility];  Service: Cardiovascular;  Laterality: Right;    LOWER EXTREMITY ANGIOGRAPHY Right 10/04/2019   Procedure: Lower Extremity Angiography;  Surgeon: Katha Cabal, MD;  Location: [facility];  Service: Cardiovascular;  Laterality: Right;     Social History:   reports that she has never smoked. She has never used smokeless tobacco. She reports that she does not drink alcohol and does not use drugs.   Family History:  Her family history includes Cancer in her brother.   Allergies Allergies  Allergen Reactions   Sulfa Antibiotics Hives     Home Medications  Prior to Admission medications   Medication Sig Start Date End Date Taking? Authorizing Provider  ACCU-CHEK AVIVA PLUS test strip  04/24/19   [provider]  acetaminophen (TYLENOL) 325 MG tablet Take 325-650 mg by mouth every 6 (six) hours as needed for headache.     [provider]  allopurinol (ZYLOPRIM) 100 MG tablet Take 100 mg by mouth daily. Patient not taking: Reported on 12/01/2020 03/23/17   [provider]  aspirin EC 81 MG EC tablet Take 1 tablet (81 mg total)  by mouth daily. 08/02/19   Stegmayer, Janalyn Harder, PA-C  calcium carbonate (OSCAL) 1500 (600 Ca) MG TABS tablet Take by mouth daily with breakfast.    [provider]  carvedilol (COREG) 6.25 MG tablet Take 6.25 mg by mouth 2 (two) times daily with a meal.  06/27/19   [provider]  clopidogrel (PLAVIX) 75 MG tablet TAKE (1) TABLET BY MOUTH EVERY DAY 11/10/20   Schnier, Dolores Lory, MD  furosemide (LASIX) 20 MG tablet Take 20 mg by mouth 2 (two) times daily.  03/07/17   [provider]  gabapentin (NEURONTIN) 100 MG capsule Take 100-200 mg by mouth at bedtime as needed (pain).     [provider]  gabapentin (NEURONTIN) 100 MG capsule Take 100 mg by mouth 3 (three) times daily.    Jason Coop, NP  glucose blood (ACCU-CHEK AVIVA PLUS) test strip CHECK BLOOD SUGAR 3 TIMES DAILY 02/14/19   [provider]  HUMALOG KWIKPEN 100  UNIT/ML KiwkPen Inject 3 Units into the skin 3 (three) times daily after meals. Per sliding scale 04/27/17   [provider]  HYDROcodone-acetaminophen (NORCO/VICODIN) 5-325 MG tablet Take 1 tablet by mouth every 4 (four) hours as needed for moderate pain. Patient not taking: No sig reported    [provider]  LEVEMIR FLEXTOUCH 100 UNIT/ML Pen Inject 8 Units into the skin at bedtime.  06/15/17   [provider]  levothyroxine (SYNTHROID, LEVOTHROID) 88 MCG tablet Take 88 mcg by mouth daily before breakfast.  05/26/17   [provider]  lisinopril (ZESTRIL) 5 MG tablet Take 5 mg by mouth daily.  12/22/18   [provider]  LORazepam (ATIVAN) 0.5 MG tablet Take 0.5 mg by mouth every 6 (six) hours as needed for anxiety. Patient not taking: Reported on 12/01/2020    [provider]  lovastatin (MEVACOR) 20 MG tablet Take 20 mg by mouth daily with supper.  05/02/17   [provider]  meclizine (ANTIVERT) 25 MG tablet Take 25 mg by mouth 3 (three) times daily as needed for dizziness. 04/20/17   [provider]  mirtazapine (REMERON) 15 MG tablet Take by mouth. 11/21/19 12/01/20  [provider]  Multiple Vitamins-Minerals (PRESERVISION AREDS 2 PO) Take 1 tablet by mouth every evening.    [provider]  oxyCODONE (OXY IR/ROXICODONE) 5 MG immediate release tablet Take 1-2 tablets (5-10 mg total) by mouth every 4 (four) hours as needed for moderate pain. Patient not taking: No sig reported 10/25/19   Stegmayer, Joelene Millin A, PA-C  potassium chloride SA (K-DUR,KLOR-CON) 20 MEQ tablet Take 20 mEq by mouth daily.  Patient not taking: No sig reported    [provider]  Vitamin D3 (VITAMIN D) 25 MCG tablet Take 1,000 Units by mouth daily.     [provider]     Critical care time: 51 minutes       Tonye Royalty ACNP-BC

## 2021-04-14 NOTE — Consult Note (Signed)
PHARMACY CONSULT NOTE - FOLLOW UP  Pharmacy Consult for Electrolyte Monitoring and Replacement   Recent Labs: Potassium (mmol/L)  Date Value  04/14/2021 3.0 (L)  08/03/2011 3.6   Magnesium (mg/dL)  Date Value  10/25/2019 1.8   Calcium (mg/dL)  Date Value  04/14/2021 9.5   Calcium, Total (mg/dL)  Date Value  08/03/2011 9.5   Albumin (g/dL)  Date Value  04/14/2021 3.8   Sodium (mmol/L)  Date Value  04/14/2021 140  08/03/2011 141     Assessment: Pharmacy has been consulted to monitor and replace electrolytes in 88yo patient with history of atrial fibrillation, not on anticoagulation, presenting to the ED with right facial droop. Tenecteplase was administered, with patient slowly improving.   Goal of Therapy:  Electrolytes WNL  Plan:  --Patient has been ordered KCL 6mEq IV x 4 doses --Will continue to monitor electrolytes with morning labs.  Pearla Dubonnet ,PharmD Clinical Pharmacist 04/14/2021 5:55 PM

## 2021-04-14 NOTE — Progress Notes (Signed)
PHARMACIST CODE STROKE RESPONSE  Notified to mix TNK at 1401 by Dr. Kathrynn Speed Delivered TNK to RN at 1403  TNK dose = 16 mg IV over 5 seconds.   Issues/delays encountered (if applicable): none  [name] 04/14/21 5:53 PM

## 2021-04-14 NOTE — ED Notes (Signed)
Code  stroke  called  to  [facility] 

## 2021-04-14 NOTE — ED Triage Notes (Addendum)
Pt to ED with son for right side facial droop, incomprehensible speech, AMS. Pt unable to follow commands  LKW 1130. Sx started noon

## 2021-04-14 NOTE — ED Notes (Signed)
Pt given warm blanket.

## 2021-04-14 NOTE — Consult Note (Signed)
Neurology Consultation Reason for Consult: Aphasia Referring Physician: Nigel Bridgeman  CC: Aphasia  History is obtained from: Son  HPI: Calley Drenning Broberg is a 88 y.o. female with a history of atrial fibrillation, not on anticoagulation due to fall risk who presents with right-sided facial droop and aphasia.  She was in her normal state of health until noon, then shortly afternoon she began having trouble with not making sense.  She only had this problem for a brief period, and then improved again and was able to tell her son that she did not want to go to the hospital and therefore they did not.  Subsequently around 130 she began having aphasia again, and was taken to the ER where a code stroke was activated.  She was taken emergently to CT which was negative for any type of acute intracranial abnormality.  She does have a history of a subdural hematoma, which occurred in the setting of trauma.  Given that it was a posttraumatic bleed, I continued and discussed with the family risks and benefits of IV thrombolytic therapy.  I did indicate that with a history of subdural hematoma, there may be an increased risk of intracranial hemorrhage, but given the severity of her symptoms thought it was reasonable to offer her IV thrombolytics.  At baseline, she has no cognitive deficits per the son, but she is wheelchair-bound due to losing her right leg.  LKW: Noon tnk given?:  Yes Premorbid modified rankin scale: 4   ROS: Unable to obtain due to altered mental status.   Past Medical History:  Diagnosis Date   A-fib [facility])    CHF (congestive heart failure) ([place])    Diabetes mellitus without complication ([place])    Hypertension      Family History  Problem Relation Age of Onset   Cancer Brother      Social History:  reports that she has never smoked. She has never used smokeless tobacco. She reports that she does not drink alcohol and does not use drugs.   Exam: Current vital signs: BP (!) 205/69     Pulse (!) 43    Temp 98.1 F (36.7 C) (Oral)    Resp 20    Wt 62.1 kg    SpO2 96%    BMI 22.80 kg/m  Vital signs in last 24 hours: Temp:  [98.1 F (36.7 C)] 98.1 F (36.7 C) (01/03 1533) Pulse Rate:  [39-67] 43 (01/03 1612) Resp:  [17-20] 20 (01/03 1612) BP: (161-227)/(64-109) 205/69 (01/03 1612) SpO2:  [95 %-100 %] 96 % (01/03 1612) Weight:  [62.1 kg] 62.1 kg (01/03 1604)   Physical Exam  Constitutional: Appears well-developed and well-nourished.  Psych: Affect appropriate to situation Eyes: No scleral injection HENT: No OP obstruction MSK: no joint deformities.  Cardiovascular: Normal rate and regular rhythm.  Respiratory: Effort normal, non-labored breathing GI: Soft.  No distension. There is no tenderness.  Skin: WDI  Neuro: Mental Status: Patient is awake, alert, she does follow commands to close her eyes, but not to make a fist, does not reliably follow other commands.   She does answer me when I ask her age, however other answers are unintelligible. Cranial Nerves: II: She blinks to threat from the left but not right. III,IV, VI: She has a left gaze preference, she does cross midline to the right once, but there is a clear preference V, VII: She has right lower facial weakness Motor: Though she does not cooperate with formal testing, she moves both  arms and her left leg fairly well, no definite asymmetry  sensory: She responds to noxious stimulation bilaterally  Cerebellar: Does not perform  I have reviewed labs in epic and the results pertinent to this consultation are: Hemoglobin 16.1  I have reviewed the images obtained: CT head-negative  Impression: 88 year old female with a history of atrial fibrillation not on anticoagulation who presents with right hemianopia, aphasia, right-sided facial droop.  I suspect embolism to a branch of the left MCA.  She has received IV tenecteplase, and will need to be admitted for close postthrombolytic care and secondary risk  factor modification.  I discussed with the son whether she would want more aggressive therapies such as transfer for endovascular embolectomy, and he states that she is indicated the past that she did not want any type of aggressive treatment options such as surgery or general anesthesia.  I think this is reasonable.  Recommendations: - HgbA1c, fasting lipid panel - MRI of the brain without contrast - Frequent neuro checks - Echocardiogram - CTA head and neck - Prophylactic therapy-none for 24 hours - Risk factor modification - Telemetry monitoring - PT consult, OT consult, Speech consult -Maintain BP less than 180/105, I have ordered Cleviprex to this effect.  This patient is critically ill and at significant risk of neurological worsening, death and care requires constant monitoring of vital signs, hemodynamics,respiratory and cardiac monitoring, neurological assessment, discussion with family, other specialists and medical decision making of high complexity. I spent 60 minutes of neurocritical care time  in the care of  this patient. This was time spent independent of any time provided by nurse practitioner or PA.  Roland Rack, MD [facility] [phone]  If 7pm- 7am, please page neurology on call as listed in [place]. 04/14/2021  4:26 PM

## 2021-04-14 NOTE — ED Provider Notes (Signed)
[facility] Provider Note    None    (approximate)   History   Code Stroke  Level V Caveat:  AMS -code stroke  HPI  [name] is a 88 y.o. female extensive past medical history including PAD as well as A. fib and on review of outside records had admission to [facility] for subdural hematoma with headache presents to the ER for altered mental status trouble speaking facial droop and confusion started around 1130.  No report of any trauma.  Glucose normal.  Patient unable to provide much additional history at this time.  Patient was called code stroke in triage.     Physical Exam   Triage Vital Signs: ED Triage Vitals [04/14/21 1533]  Enc Vitals Group     BP (!) 161/109     Pulse Rate 67     Resp 18     Temp 98.1 F (36.7 C)     Temp Source Oral     SpO2 100 %     Weight      Height      Head Circumference      Peak Flow      Pain Score      Pain Loc      Pain Edu?      Excl. in [place]?     Most recent vital signs: Vitals:   04/14/21 1644 04/14/21 1646  BP: (!) 145/60 (!) 146/62  Pulse: 65 63  Resp:    Temp:    SpO2: 94% 95%     Constitutional: Alert but unable to provide additional; history Eyes: Conjunctivae are normal.  Head: Atraumatic. Nose: No congestion/rhinnorhea. Mouth/Throat: Mucous membranes are moist.   Neck: Painless ROM.  Cardiovascular:   Good peripheral circulation. Irregularly irregular Respiratory: Normal respiratory effort.  No retractions.  Gastrointestinal: Soft and nontender.  Musculoskeletal:  s/p r bka, no other deformity Neurologic:  left gaze preference, right lower facial droop, MAE spontaneously,  Skin:  Skin is warm, dry and intact. No rash noted. Psychiatric: unable to assess    ED Results / Procedures / Treatments   Labs (all labs ordered are listed, but only abnormal results are displayed) Labs Reviewed  CBC - Abnormal; Notable for the following components:      Result Value    Hemoglobin 16.1 (*)    HCT 46.1 (*)    All other components within normal limits  COMPREHENSIVE METABOLIC PANEL - Abnormal; Notable for the following components:   Potassium 3.0 (*)    Glucose, Bld 180 (*)    Creatinine, Ser 1.43 (*)    GFR, Estimated 34 (*)    All other components within normal limits  CBG MONITORING, ED - Abnormal; Notable for the following components:   Glucose-Capillary 159 (*)    All other components within normal limits  CBG MONITORING, ED - Abnormal; Notable for the following components:   Glucose-Capillary 158 (*)    All other components within normal limits  PROTIME-INR  APTT  DIFFERENTIAL     EKG  ED ECG REPORT I, Merlyn Lot, the attending physician, personally viewed and interpreted this ECG.   Date: 04/14/2021  EKG Time: 15:57  Rate: 50  Rhythm: sinus with sinus bradycardia and frequent pause  Axis: normal  Intervals: normal intervals,   ST&T Change: no stemi    RADIOLOGY    PROCEDURES:  Critical Care performed: Yes, see critical care procedure note(s)  .Critical Care Performed by: Merlyn Lot, MD  Authorized by: Merlyn Lot, MD   Critical care provider statement:    Critical care time (minutes):  20   Critical care was necessary to treat or prevent imminent or life-threatening deterioration of the following conditions:  CNS failure or compromise   Critical care was time spent personally by me on the following activities:  Ordering and performing treatments and interventions, ordering and review of laboratory studies, ordering and review of radiographic studies, pulse oximetry, re-evaluation of patient's condition, review of old charts, obtaining history from patient or surrogate, examination of patient, evaluation of patient's response to treatment, discussions with primary provider, discussions with consultants and development of treatment plan with patient or surrogate .1-3 Lead EKG Interpretation Performed by:  Merlyn Lot, MD Authorized by: Merlyn Lot, MD     Interpretation: abnormal     ECG rate:  50   ECG rate assessment: bradycardic     Rhythm: sinus bradycardia     Conduction: normal     MEDICATIONS ORDERED IN ED: Medications  sodium chloride flush (NS) 0.9 % injection 3 mL (has no administration in time range)  clevidipine (CLEVIPREX) infusion 0.5 mg/mL (6 mg/hr Intravenous Rate/Dose Change 04/14/21 1615)  tenecteplase (TNKASE) injection for Stroke 16 mg (16 mg Intravenous Given 04/14/21 1615)  iohexol (OMNIPAQUE) 350 MG/ML injection 75 mL (75 mLs Intravenous Contrast Given 04/14/21 1626)     IMPRESSION / MDM / ASSESSMENT AND PLAN / ED COURSE  I reviewed the triage vital signs and the nursing notes.                              Differential diagnosis includes, but is not limited to, cva, tia, hypoglycemia, dehydration, electrolyte abnormality, dissection, sepsis  Patient is 88 year old woman presenting with symptoms of acute CVA within code stroke timeframe.  I have a evaluated patient upon arrival to CT scanner she is protecting her airway.  CBG 150.  She has noted to be hypertensive.  Initial CT back in scanner does not not appear to be consistent with bleed but she does have acute deficits that are certainly concerning for CVA and patient has pretty significant PAD history as well as A. fib on aspirin and Plavix.  Patient taken back to room for emergent neurologic consultation. The patient will be placed on continuous pulse oximetry and telemetry for monitoring.  Laboratory evaluation will be sent to evaluate for the above complaints.      Clinical Course as of 04/14/21 1637  Tue Apr 14, 2021  1547 My review of CT I do not see any evidence of head bleed.  Will await formal read from radiology. [PR]  7253 Dr. Leonel Ramsay of neurology is at bedside currently consenting patient and family for TN K.  Neurology: So there is no acute abnormality within the brain.  CBG repeated and  is 150.  Patient is hypertensive, given iv labetalol and will start cleviprex. [PR]  108 Dr. Leonel Ramsay has requested CTA head and neck. [PR]  1615 Cleviprex infusion started for blood pressure control. She is protecting her airway.  I reviewed blood work.  [PR]      Clinical Course User Index [PR] Merlyn Lot, MD     FINAL CLINICAL IMPRESSION(S) / ED DIAGNOSES   Final diagnoses:  Cerebrovascular accident (CVA), unspecified mechanism ([place])  Hypertension, unspecified type     Rx / DC Orders   ED Discharge Orders     None  Note:  This document was prepared using Dragon voice recognition software and may include unintentional dictation errors.    Merlyn Lot, MD 04/14/21 1704

## 2021-04-14 NOTE — ED Triage Notes (Signed)
Pt to CT

## 2021-04-14 NOTE — Progress Notes (Signed)
°  Chaplain On-Call responded to Code Stroke notification.  Medical Team members are attending to the patient, who is unavailable for Chaplain visit at the present time.  Chaplain will refer this information to the next On-Call Chaplain, Alroy Dust.  Chaplain Pollyann Samples M.Div., [facility]

## 2021-04-14 NOTE — ED Notes (Signed)
Phone given to family member to answer MRI questions

## 2021-04-14 NOTE — ED Notes (Signed)
CBG results not showing in epic. CBG checked prior to transport to CT. >150

## 2021-04-15 ENCOUNTER — Inpatient Hospital Stay
Admit: 2021-04-15 | Discharge: 2021-04-15 | Disposition: A | Discharge status: Home / Self Care | Payer: Medicare Other | Attending: Nurse Practitioner | Admitting: Nurse Practitioner

## 2021-04-15 ENCOUNTER — Other Ambulatory Visit: Payer: Self-pay

## 2021-04-15 ENCOUNTER — Inpatient Hospital Stay: Payer: Medicare Other

## 2021-04-15 DIAGNOSIS — I639 Cerebral infarction, unspecified: Secondary | ICD-10-CM | POA: Diagnosis not present

## 2021-04-15 LAB — CBC
HCT: 38.8 % (ref 36.0–46.0)
Hemoglobin: 13.4 g/dL (ref 12.0–15.0)
MCH: 31.8 pg (ref 26.0–34.0)
MCHC: 34.5 g/dL (ref 30.0–36.0)
MCV: 92.2 fL (ref 80.0–100.0)
Platelets: 179 10*3/uL (ref 150–400)
RBC: 4.21 MIL/uL (ref 3.87–5.11)
RDW: 12.2 % (ref 11.5–15.5)
WBC: 7.7 10*3/uL (ref 4.0–10.5)
nRBC: 0 % (ref 0.0–0.2)

## 2021-04-15 LAB — BASIC METABOLIC PANEL
Anion gap: 11 (ref 5–15)
BUN: 25 mg/dL — ABNORMAL HIGH (ref 8–23)
CO2: 26 mmol/L (ref 22–32)
Calcium: 8.4 mg/dL — ABNORMAL LOW (ref 8.9–10.3)
Chloride: 101 mmol/L (ref 98–111)
Creatinine, Ser: 1.58 mg/dL — ABNORMAL HIGH (ref 0.44–1.00)
GFR, Estimated: 30 mL/min — ABNORMAL LOW (ref >60)
Glucose, Bld: 220 mg/dL — ABNORMAL HIGH (ref 70–99)
Potassium: 4 mmol/L (ref 3.5–5.1)
Sodium: 138 mmol/L (ref 135–145)

## 2021-04-15 LAB — ECHOCARDIOGRAM COMPLETE
AR max vel: 0.91 cm2
AV Area VTI: 0.95 cm2
AV Area mean vel: 0.93 cm2
AV Mean grad: 14.3 mmHg
AV Peak grad: 26.6 mmHg
Ao pk vel: 2.58 m/s
Area-P 1/2: 5.13 cm2
MV VTI: 1.27 cm2
S' Lateral: 3.41 cm
Weight: 2192 oz

## 2021-04-15 LAB — PHOSPHORUS: Phosphorus: 4.2 mg/dL (ref 2.5–4.6)

## 2021-04-15 LAB — MAGNESIUM: Magnesium: 1.9 mg/dL (ref 1.7–2.4)

## 2021-04-15 MED ORDER — MORPHINE SULFATE (PF) 2 MG/ML IV SOLN
1.0000 mg | INTRAVENOUS | Status: DC | PRN
  Administered 2021-04-15: 1 mg via INTRAVENOUS
  Filled 2021-04-15: qty 1

## 2021-04-15 MED ORDER — POTASSIUM CHLORIDE 10 MEQ/100ML IV SOLN
INTRAVENOUS | Status: AC
  Filled 2021-04-15: qty 100

## 2021-04-15 MED ORDER — LIDOCAINE 5 % EX PTCH
1.0000 | MEDICATED_PATCH | CUTANEOUS | Status: DC
  Administered 2021-04-15 – 2021-04-24 (×10): 1 via TRANSDERMAL
  Filled 2021-04-15 (×12): qty 1

## 2021-04-15 MED ORDER — FENTANYL CITRATE PF 50 MCG/ML IJ SOSY
50.0000 ug | PREFILLED_SYRINGE | Freq: Once | INTRAMUSCULAR | Status: AC
Start: 2021-04-15 — End: 2021-04-15
  Administered 2021-04-15: 50 ug via INTRAVENOUS

## 2021-04-15 MED ORDER — POTASSIUM CHLORIDE 10 MEQ/100ML IV SOLN
10.0000 meq | INTRAVENOUS | Status: AC
  Administered 2021-04-15 (×4): 10 meq via INTRAVENOUS

## 2021-04-15 MED ORDER — FENTANYL CITRATE PF 50 MCG/ML IJ SOSY
PREFILLED_SYRINGE | INTRAMUSCULAR | Status: AC
  Filled 2021-04-15: qty 1

## 2021-04-15 MED ORDER — ATORVASTATIN CALCIUM 20 MG PO TABS
40.0000 mg | ORAL_TABLET | Freq: Every day | ORAL | Status: DC
  Administered 2021-04-16 – 2021-04-25 (×8): 40 mg via ORAL
  Filled 2021-04-15 (×9): qty 2

## 2021-04-15 MED ORDER — POTASSIUM CHLORIDE 10 MEQ/100ML IV SOLN
INTRAVENOUS | Status: AC
  Filled 2021-04-15: qty 300

## 2021-04-15 MED ORDER — POTASSIUM CHLORIDE 10 MEQ/100ML IV SOLN: 10.0000 meq | INTRAVENOUS | Status: DC

## 2021-04-15 NOTE — Progress Notes (Signed)
Subjective: Patient had significant improvement status post TNK  Exam: Vitals:   04/15/21 1000 04/15/21 1030  BP: (!) 165/73 (!) 154/66  Pulse: 84 (!) 46  Resp: 19 20  Temp:    SpO2: 95% 100%   Gen: In bed, NAD Resp: non-labored breathing, no acute distress Abd: soft, nt  Neuro: MS: Awake, alert, speech is slow, she does not answer orientation questions, but she does name objects, is able to name a watch, though cannot give me watchband CN: Visual fields are full, EOMI without gaze preference Motor: She has no drift in either arm or her left leg, right AKA Sensory: Intact to light touch   Pertinent Labs: LDL is 110 A1c 7.7  Impression: 88 year old female with an embolic stroke in the setting of atrial fibrillation without anticoagulation.  Speaking with her son, she apparently was not much of a fall risk, given that she is wheelchair-bound.  I would favor holding anticoagulation for 2 weeks, but as long as she continues to not be a fall risk, I would favor starting one of the novel anticoagulants as a secondary preventative.  She is on lovastatin, I would favor being slightly more aggressive with statin therapy, consider atorvastatin 40 mg daily  She is on dual antiplatelet therapy.  If she is started on anticoagulation, and needs antiplatelet therapy for something other than stroke prevention, I favor combining with only single antiplatelet.  Recommendations: 1) aspirin 81 mg daily, then start Eliquis 5 mg twice daily after 2 weeks. 2) atorvastatin 40 mg daily 3) echo 4) PT, OT, ST 5) DM control with goal LDL less than 7 6) neurology will continue to follow  Roland Rack, MD [facility] [phone]  If 7pm- 7am, please page neurology on call as listed in [place].

## 2021-04-15 NOTE — ED Notes (Addendum)
Pts BP trending low. MD made aware (E. Ouma,) and request the Cleviprex be discontinued.

## 2021-04-15 NOTE — ED Notes (Signed)
Informed RN bed assigned 

## 2021-04-15 NOTE — ED Notes (Signed)
Patient transported to CT 

## 2021-04-15 NOTE — ED Notes (Signed)
Primary RN Lorriane Shire aware of titration down to 2mg /hr of Cleviprex due to patient most recent trending blood pressures.

## 2021-04-15 NOTE — Consult Note (Signed)
PHARMACY CONSULT NOTE - FOLLOW UP  Pharmacy Consult for Electrolyte Monitoring and Replacement   Recent Labs: Potassium (mmol/L)  Date Value  04/15/2021 4.0  08/03/2011 3.6   Magnesium (mg/dL)  Date Value  04/15/2021 1.9   Calcium (mg/dL)  Date Value  04/15/2021 8.4 (L)   Calcium, Total (mg/dL)  Date Value  08/03/2011 9.5   Albumin (g/dL)  Date Value  04/14/2021 3.8   Phosphorus (mg/dL)  Date Value  04/15/2021 4.2   Sodium (mmol/L)  Date Value  04/15/2021 138  08/03/2011 141     Assessment: Pharmacy has been consulted to monitor and replace electrolytes in 88yo patient with history of atrial fibrillation, not on anticoagulation, presenting to the ED with right facial droop. Tenecteplase was administered, with patient slowly improving.   Goal of Therapy:  Electrolytes WNL  Plan:  K 4.0  Mag 1.9  Phos 4.2  Scr 1.58 --No replacement at this time --Will continue to monitor electrolytes with morning labs.  Noralee Space ,PharmD Clinical Pharmacist 04/15/2021 9:26 AM

## 2021-04-15 NOTE — Progress Notes (Signed)
NAME:  [name], MRN:  [id number], DOB:  [birth year], LOS: 1 ADMISSION DATE:  04/14/2021, CONSULTATION DATE:  04/14/2021 REFERRING MD:  ED, CHIEF COMPLAINT:  Aphasia   History of Present Illness:  88yo female w/ a past medical of atrial fibrillation not on anticoagulation due to fall risk, PAD s/p RLE amputation, hypothyroidism, type II diabetes and hypertension who presented to the ED this afternoon with acute onset of aphasia that initially improved but then around 1330 began again and was brought to the ED and a code stroke was initiated.   CT CODE Stroke with no hemorrhage or LVO. Patient does have a history of prior SDH following a fall years back. After discussion between neurology team and family, decision was made to move forward with TNK administration.   Critical care was called for ICU admission. Patient seen and examined in the ED. TNK infusing. VSS. Patient is alert but w/expressive aphasia. Attempted to state name but then shook head no that she could not speak. Able to follow commands weakly. Difficult to obtain full NIHSS given degree of symptoms. Patients son and daughter at bedside. Son states she has been in her usual state of health and did 3 loads of laundry today. He states baseline she is independent with no neurological deficits.   Chart reviewed. Significant lab values include: K 3, Cr 1.43, Glucose 180  04/15/20- patient is sleeping during my evaluation this am. Her family is with her at bedside. She is on room air and off all infusions with normal vital signs and HR 50-60.  Plan to transfer to medical floor with TRH post 24h neurocheck period is over.   Pertinent  Medical History  PAD RLE amputation Hypertension Hyperlipidemia Atrial fibrillation  Significant Hospital Events: Including procedures, antibiotic start and stop dates in addition to other pertinent events   1/3 CODE Stroke s/p TNK Administration  Interim History / Subjective:  88yo female who  presented 1/3 with stroke like symptoms including aphasia s/p TNK administration.   Objective   Blood pressure (!) 159/69, pulse 76, temperature 98 F (36.7 C), resp. rate 18, weight 62.1 kg, SpO2 100 %.       No intake or output data in the 24 hours ending 04/15/21 0926 Filed Weights   04/14/21 1604  Weight: 62.1 kg    Examination: General: NAD age appropriate HENT: Pupils equal and reactive Lungs: Lungs clear Cardiovascular: Irregular rhythm Abdomen: Soft, non-tender Extremities: RLE amputation Neuro: Alert but aphasic. Able to follow commands weakly  Resolved Hospital Problem list   NA  Assessment & Plan:  Acute Ischemic Stroke S/P TNK --Suspect cardioembolic given known afib not on anticoagulation --Neurology following --Post TPA protocol --Frequent neuro checks --SBP <180 --Stroke workup (Lipid profile, HgbA1c, ECHO) --Repeat CTH 24hr post TNK --MRI pending --PT/OT/Speech to evaluate  Hypertension Hyperlipidemia --Home medications: ASA, lisinopril, lovastatin, coreg --Hold home medications for now  PAD --Home medications: Plavix --Hold for now s/p lytics  Hypothyroidism --Continue home levothyroxine 51mcg  Type II Diabetes --SSI  Hypokalemia --K 3.0 --KCL 40 meq ordered IV  Best Practice (right click and "Reselect all SmartList Selections" daily)   Diet/type: NPO DVT prophylaxis: other GI prophylaxis: N/A Lines: N/A Foley:  N/A Code Status:  DNR Last date of multidisciplinary goals of care discussion [Patients daughter and son updated at bedside]  Labs   CBC: Recent Labs  Lab 04/14/21 1600 04/15/21 0748  WBC 6.6 7.7  NEUTROABS 4.3  --   HGB 16.1* 13.4  HCT 46.1* 38.8  MCV 92.4 92.2  PLT 188 179     Basic Metabolic Panel: Recent Labs  Lab 04/14/21 1600 04/15/21 0748  NA 140 138  K 3.0* 4.0  CL 101 101  CO2 30 26  GLUCOSE 180* 220*  BUN 23 25*  CREATININE 1.43* 1.58*  CALCIUM 9.5 8.4*  MG  --  1.9  PHOS  --  4.2     GFR: Estimated Creatinine Clearance: 20 mL/min (A) (by C-G formula based on SCr of 1.58 mg/dL (H)). Recent Labs  Lab 04/14/21 1600 04/15/21 0748  WBC 6.6 7.7     Liver Function Tests: Recent Labs  Lab 04/14/21 1600  AST 20  ALT 13  ALKPHOS 91  BILITOT 0.8  PROT 8.0  ALBUMIN 3.8    No results for input(s): LIPASE, AMYLASE in the last 168 hours. No results for input(s): AMMONIA in the last 168 hours.  ABG No results found for: PHART, PCO2ART, PO2ART, HCO3, TCO2, ACIDBASEDEF, O2SAT   Coagulation Profile: Recent Labs  Lab 04/14/21 1600  INR 1.1     Cardiac Enzymes: No results for input(s): CKTOTAL, CKMB, CKMBINDEX, TROPONINI in the last 168 hours.  HbA1C: Hgb A1c MFr Bld  Date/Time Value Ref Range Status  04/14/2021 04:00 PM 7.7 (H) 4.8 - 5.6 % Final    Comment:    (NOTE) Pre diabetes:          5.7%-6.4%  Diabetes:              >6.4%  Glycemic control for   <7.0% adults with diabetes   03/01/2019 06:41 PM 6.2 (H) 4.8 - 5.6 % Final    Comment:    (NOTE) Pre diabetes:          5.7%-6.4% Diabetes:              >6.4% Glycemic control for   <7.0% adults with diabetes     CBG: Recent Labs  Lab 04/14/21 1532 04/14/21 1554  GLUCAP 159* 158*     Review of Systems:   Unable to assess due to condition  Past Medical History:  She,  has a past medical history of A-fib ([place]), CHF (congestive heart failure) ([place]), Diabetes mellitus without complication ([place]), and Hypertension.   Surgical History:   Past Surgical History:  Procedure Laterality Date   AMPUTATION Right 10/10/2019   Procedure: AMPUTATION ABOVE KNEE;  Surgeon: Katha Cabal, MD;  Location: [facility] ORS;  Service: Vascular;  Laterality: Right;   CHOLECYSTECTOMY     LOWER EXTREMITY ANGIOGRAPHY Right 08/12/2017   Procedure: LOWER EXTREMITY ANGIOGRAPHY;  Surgeon: Katha Cabal, MD;  Location: [place] [facility];  Service: Cardiovascular;  Laterality: Right;   LOWER EXTREMITY  ANGIOGRAPHY Right 06/13/2018   Procedure: LOWER EXTREMITY ANGIOGRAPHY;  Surgeon: Katha Cabal, MD;  Location: [place] [facility];  Service: Cardiovascular;  Laterality: Right;   LOWER EXTREMITY ANGIOGRAPHY Right 03/01/2019   Procedure: LOWER EXTREMITY ANGIOGRAPHY;  Surgeon: Katha Cabal, MD;  Location: [place] [facility];  Service: Cardiovascular;  Laterality: Right;   LOWER EXTREMITY ANGIOGRAPHY Right 07/31/2019   Procedure: LOWER EXTREMITY ANGIOGRAPHY;  Surgeon: Katha Cabal, MD;  Location: [place] [facility];  Service: Cardiovascular;  Laterality: Right;   LOWER EXTREMITY ANGIOGRAPHY Right 10/04/2019   Procedure: Lower Extremity Angiography;  Surgeon: Katha Cabal, MD;  Location: [place] [facility];  Service: Cardiovascular;  Laterality: Right;     Social History:   reports that she has  never smoked. She has never used smokeless tobacco. She reports that she does not drink alcohol and does not use drugs.   Family History:  Her family history includes Cancer in her brother.   Allergies Allergies  Allergen Reactions   Sulfa Antibiotics Hives     Home Medications  Prior to Admission medications   Medication Sig Start Date End Date Taking? Authorizing Provider  ACCU-CHEK AVIVA PLUS test strip  04/24/19   [provider]  acetaminophen (TYLENOL) 325 MG tablet Take 325-650 mg by mouth every 6 (six) hours as needed for headache.     [provider]  allopurinol (ZYLOPRIM) 100 MG tablet Take 100 mg by mouth daily. Patient not taking: Reported on 12/01/2020 03/23/17   [provider]  aspirin EC 81 MG EC tablet Take 1 tablet (81 mg total) by mouth daily. 08/02/19   Stegmayer, Janalyn Harder, PA-C  calcium carbonate (OSCAL) 1500 (600 Ca) MG TABS tablet Take by mouth daily with breakfast.    [provider]  carvedilol (COREG) 6.25 MG tablet Take 6.25 mg by mouth 2 (two) times daily with a meal.  06/27/19   [provider]   clopidogrel (PLAVIX) 75 MG tablet TAKE (1) TABLET BY MOUTH EVERY DAY 11/10/20   Schnier, Dolores Lory, MD  furosemide (LASIX) 20 MG tablet Take 20 mg by mouth 2 (two) times daily.  03/07/17   [provider]  gabapentin (NEURONTIN) 100 MG capsule Take 100-200 mg by mouth at bedtime as needed (pain).     [provider]  gabapentin (NEURONTIN) 100 MG capsule Take 100 mg by mouth 3 (three) times daily.    Jason Coop, NP  glucose blood (ACCU-CHEK AVIVA PLUS) test strip CHECK BLOOD SUGAR 3 TIMES DAILY 02/14/19   [provider]  HUMALOG KWIKPEN 100 UNIT/ML KiwkPen Inject 3 Units into the skin 3 (three) times daily after meals. Per sliding scale 04/27/17   [provider]  HYDROcodone-acetaminophen (NORCO/VICODIN) 5-325 MG tablet Take 1 tablet by mouth every 4 (four) hours as needed for moderate pain. Patient not taking: No sig reported    [provider]  LEVEMIR FLEXTOUCH 100 UNIT/ML Pen Inject 8 Units into the skin at bedtime.  06/15/17   [provider]  levothyroxine (SYNTHROID, LEVOTHROID) 88 MCG tablet Take 88 mcg by mouth daily before breakfast.  05/26/17   [provider]  lisinopril (ZESTRIL) 5 MG tablet Take 5 mg by mouth daily.  12/22/18   [provider]  LORazepam (ATIVAN) 0.5 MG tablet Take 0.5 mg by mouth every 6 (six) hours as needed for anxiety. Patient not taking: Reported on 12/01/2020    [provider]  lovastatin (MEVACOR) 20 MG tablet Take 20 mg by mouth daily with supper.  05/02/17   [provider]  meclizine (ANTIVERT) 25 MG tablet Take 25 mg by mouth 3 (three) times daily as needed for dizziness. 04/20/17   [provider]  mirtazapine (REMERON) 15 MG tablet Take by mouth. 11/21/19 12/01/20  [provider]  Multiple Vitamins-Minerals (PRESERVISION AREDS 2 PO) Take 1 tablet by mouth every evening.    [provider]  oxyCODONE (OXY IR/ROXICODONE) 5 MG immediate  release tablet Take 1-2 tablets (5-10 mg total) by mouth every 4 (four) hours as needed for moderate pain. Patient not taking: No sig reported 10/25/19   Stegmayer, Joelene Millin A, PA-C  potassium chloride SA (K-DUR,KLOR-CON) 20 MEQ tablet Take 20 mEq by mouth daily.  Patient not taking:  No sig reported    [provider]  Vitamin D3 (VITAMIN D) 25 MCG tablet Take 1,000 Units by mouth daily.     [provider]     Critical care provider statement:   Total critical care time: 33 minutes   Performed by: Lanney Gins MD   Critical care time was exclusive of separately billable procedures and treating other patients.   Critical care was necessary to treat or prevent imminent or life-threatening deterioration.   Critical care was time spent personally by me on the following activities: development of treatment plan with patient and/or surrogate as well as nursing, discussions with consultants, evaluation of patient's response to treatment, examination of patient, obtaining history from patient or surrogate, ordering and performing treatments and interventions, ordering and review of laboratory studies, ordering and review of radiographic studies, pulse oximetry and re-evaluation of patient's condition.    Ottie Glazier, M.D.  Pulmonary & Critical Care Medicine

## 2021-04-15 NOTE — Progress Notes (Signed)
OT Cancellation Note  Patient Details Name: [name] MRN: [id number] DOB: 1925/05/10   Cancelled Treatment:    Reason Eval/Treat Not Completed: Medical issues which prohibited therapy OT consult received and chart reviewed. Pt s/p CVA and was administered TNK on 1/3 at 1614. Per protocol, will hold OT evaluation until 24 hours s/p infusion and f/u imaging. Will continue to follow.  Gerrianne Scale, [place], OTR/L ascom [phone] 04/15/21, 9:23 AM

## 2021-04-15 NOTE — Evaluation (Signed)
Clinical/Bedside Swallow Evaluation Patient Details  Name: [name] MRN: [id number] Date of Birth: 12-20-1925  Today's Date: 04/15/2021 Time: SLP Start Time (ACUTE ONLY): 1210 SLP Stop Time (ACUTE ONLY): 1310 SLP Time Calculation (min) (ACUTE ONLY): 60 min  Past Medical History:  Past Medical History:  Diagnosis Date   A-fib ([place])    CHF (congestive heart failure) ([place])    Diabetes mellitus without complication ([place])    Hypertension    Past Surgical History:  Past Surgical History:  Procedure Laterality Date   AMPUTATION Right 10/10/2019   Procedure: AMPUTATION ABOVE KNEE;  Surgeon: Katha Cabal, MD;  Location: [facility];  Service: Vascular;  Laterality: Right;   CHOLECYSTECTOMY     LOWER EXTREMITY ANGIOGRAPHY Right 08/12/2017   Procedure: LOWER EXTREMITY ANGIOGRAPHY;  Surgeon: Katha Cabal, MD;  Location: [place] [facility];  Service: Cardiovascular;  Laterality: Right;   LOWER EXTREMITY ANGIOGRAPHY Right 06/13/2018   Procedure: LOWER EXTREMITY ANGIOGRAPHY;  Surgeon: Katha Cabal, MD;  Location: [place] [facility];  Service: Cardiovascular;  Laterality: Right;   LOWER EXTREMITY ANGIOGRAPHY Right 03/01/2019   Procedure: LOWER EXTREMITY ANGIOGRAPHY;  Surgeon: Katha Cabal, MD;  Location: [place] [facility];  Service: Cardiovascular;  Laterality: Right;   LOWER EXTREMITY ANGIOGRAPHY Right 07/31/2019   Procedure: LOWER EXTREMITY ANGIOGRAPHY;  Surgeon: Katha Cabal, MD;  Location: [place] [facility];  Service: Cardiovascular;  Laterality: Right;   LOWER EXTREMITY ANGIOGRAPHY Right 10/04/2019   Procedure: Lower Extremity Angiography;  Surgeon: Katha Cabal, MD;  Location: [place] [facility];  Service: Cardiovascular;  Laterality: Right;   HPI:  Pt 88yo female w/ a past medical of atrial fibrillation not on anticoagulation due to fall risk, PAD s/p RLE amputation, hypothyroidism, type II diabetes and hypertension, Afib, CHF, HLD; also c/o s/s of  Reflux who presented to the ED this afternoon with acute onset of aphasia that initially improved but then around 1330 began again and was brought to the ED and a code stroke was initiated.   CT CODE Stroke with no hemorrhage or LVO. Patient does have a history of prior SDH following a fall years back. After discussion between neurology team and family, decision was made to move forward with TNK administration.    MRI and Head CT Post TNK: Patchy multifocal acute ischemic infarcts involving the left  basal ganglia and posterior left parieto-occipital region as above.  No associated hemorrhage or mass effect;  Recent infarction of the left  basal ganglia is more conspicuous on this examination.  Cerebral atrophy and chronic microvascular ischemic changes of  the white matter.  Postsurgical changes for right frontal/parietal craniectomy.    Assessment / Plan / Recommendation  Clinical Impression  Pt appears to present w/ grossly adequate oropharyngeal phase swallowing function w/ No overt, immediate oropharyngeal phase dysphagia appreciated; no neuromuscular swallowing deficits. Pt is at reduced risk for aspiration when following general aspiration precautions and given support w/ oral intake; although risk for aspiration is present in light of new Neurological event(L basal ganglia infarct) and advanced age. Pt wears full set of Dentures; eats w/ her Dentures. Pt's speech is improving per MD, family today. However, pt received Morphine prior to this assessment, so pt was min drowsy. Orientation deficits noted per MD note. Recommend monitoring and f/u w/ skilled ST services in the home post Discharge(and given time to improve State/[place]am[place]), if indicated.  Pt supported and given cues on positioning for full upright sitting for safer  oral intake. She then consumed trials of thin liquids, purees, and soft solids w/ no overt clinical s/s of aspiration noted; clear(low) vocal quality b/t trials. Respiratory effort  remained calm during/post trials. O2 sats remained 98%. Oral phase appeared [facility] for bolus management, A-P transfer, and timely swallowing except for min increased time for full mastication/clearing of soft solids -- applesauce was used to soften the cookie. Oral clearing achieved b/t trials. OM exam was [facility] for oral clearing; lingual/labial movements. No unilateral weakness.  Pt described and endorsed potential s/s of REFLUX including the Globus feelings when eating solids at a previous evaluation in 2021. Any Reflux behavior and/or Esophageal dysmotility can impact comfort of swallowing and the pharyngeal phase of swallowing. Discussed general aging impact on Esophageal motility and oral intake in general then; Reflux precautions.    Recommend a more mech soft diet w/ Chopped meats, moistened foods w/ thin liquids VIA CUP; general Reflux and aspiration precautions. Recommend positioning and less talking/distraction during meals to allow pt to focus on oral intake. Recommend support at meals w/ feeding d/t generalized weakness w/ illness. Recommend Pills Whole in Puree for safer swallowing. These precautions were discussed w/ pt/Son/NSG; posted in chart for NSG. No further skilled ST services indicated at this time. NSG to reconsult if any new needs while admitted. NSG updated. SLP Visit Diagnosis: Dysphagia, unspecified (R13.10)    Aspiration Risk   (reduced following general precautions)    Diet Recommendation   Mech soft diet w/ Chopped meats, moistened foods w/ thin liquids VIA CUP; general Reflux and aspiration precautions. Recommend positioning Upright and less talking/distraction during meals to allow pt to focus on oral intake. Recommend support at meals w/ feeding d/t generalized weakness w/ illness.  Medication Administration: Whole meds with puree for safer swallowing   Other  Recommendations Recommended Consults:  (Dietician f/u) Oral Care Recommendations: Oral care BID;Oral care before and  after PO;Staff/trained caregiver to provide oral care (denture care) Other Recommendations:  (n/a)    Recommendations for follow up therapy are one component of a multi-disciplinary discharge planning process, led by the attending physician.  Recommendations may be updated based on patient status, additional functional criteria and insurance authorization.  Follow up Recommendations No SLP follow up      Assistance Recommended at Discharge Set up Supervision/Assistance  Functional Status Assessment Patient has had a recent decline in their functional status and demonstrates the ability to make significant improvements in function in a reasonable and predictable amount of time.  Frequency and Duration  (n/a)   (n/a)       Prognosis Prognosis for Safe Diet Advancement: Fair (-Good) Barriers to Reach Goals: Time post onset;Severity of deficits (advanced age)      27 Study   General Date of Onset: 04/14/21 HPI: Pt 88yo female w/ a past medical of atrial fibrillation not on anticoagulation due to fall risk, PAD s/p RLE amputation, hypothyroidism, type II diabetes and hypertension, Afib, CHF, HLD; also c/o s/s of Reflux who presented to the ED this afternoon with acute onset of aphasia that initially improved but then around 1330 began again and was brought to the ED and a code stroke was initiated.   CT CODE Stroke with no hemorrhage or LVO. Patient does have a history of prior SDH following a fall years back. After discussion between neurology team and family, decision was made to move forward with TNK administration.  MRI and Head CT Post TNK: Patchy multifocal acute ischemic infarcts involving the left  basal ganglia and posterior left parieto-occipital region as above.  No associated hemorrhage or mass effect;  Recent infarction of the left  basal ganglia is more conspicuous on this examination.  Cerebral atrophy and chronic microvascular ischemic changes of  the white matter.   Postsurgical changes for right frontal/parietal craniectomy. Type of Study: Bedside Swallow Evaluation Previous Swallow Assessment: none Diet Prior to this Study: NPO (regular diet at home per Son) Temperature Spikes Noted: No (wbc 7.7) Respiratory Status: Nasal cannula (2L) History of Recent Intubation: No Behavior/Cognition: Alert;Cooperative;Pleasant mood;Requires cueing (min drowsy - recent med w/ NSG) Oral Cavity Assessment: Within Functional Limits Oral Care Completed by SLP: Yes Oral Cavity - Dentition: Dentures, top;Dentures, bottom (in place) Vision: Functional for self-feeding Self-Feeding Abilities: Able to feed self;Needs set up;Needs assist (holds cup to drink) Patient Positioning: Upright in bed (positioning support) Baseline Vocal Quality: Low vocal intensity Volitional Swallow: Able to elicit    Oral/Motor/Sensory Function Overall Oral Motor/Sensory Function: Within functional limits   Ice Chips Ice chips: Within functional limits Presentation: Spoon (fed; 2 trials)   Thin Liquid Thin Liquid: Within functional limits Presentation: Cup;Self Fed (~4 ozs total) Other Comments: water, juice    Nectar Thick Nectar Thick Liquid: Not tested   Honey Thick Honey Thick Liquid: Not tested   Puree Puree: Within functional limits Presentation: Spoon (fed; 7 trials)   Solid     Solid: Within functional limits (grossly for mech soft, moistened) Presentation: Spoon (fed; 6 trials)        Orinda Kenner, MS, [place] Speech Language Pathologist Rehab Services [phone] Allin Frix 04/15/2021,4:40 PM

## 2021-04-15 NOTE — Progress Notes (Signed)
PT Cancellation Note  Patient Details Name: [name] MRN: [id number] DOB: 11-26-1925   Cancelled Treatment:    Reason Eval/Treat Not Completed: Other (comment). PT order received and chart reviewed. Pt is s/p CVA and TNK administration on 1/3 at 1615. Per protocol, will remain bedrest for 24 hr s/p infusion and completion of follow up imaging. Will re-attempt.   Neshia Mckenzie 04/15/2021, 8:49 AM Greggory Stallion, PT, DPT [phone]

## 2021-04-15 NOTE — Progress Notes (Signed)
*  PRELIMINARY RESULTS* Echocardiogram 2D Echocardiogram has been performed.  Wallie Char Sher Hellinger 04/15/2021, 11:19 AM

## 2021-04-15 NOTE — ED Notes (Signed)
Pt oxygen saturation decreased to 88% and pt was placed on 3L [place] at this time.

## 2021-04-16 ENCOUNTER — Inpatient Hospital Stay: Payer: Medicare Other

## 2021-04-16 DIAGNOSIS — N189 Chronic kidney disease, unspecified: Secondary | ICD-10-CM

## 2021-04-16 DIAGNOSIS — I10 Essential (primary) hypertension: Secondary | ICD-10-CM

## 2021-04-16 DIAGNOSIS — I63432 Cerebral infarction due to embolism of left posterior cerebral artery: Secondary | ICD-10-CM

## 2021-04-16 DIAGNOSIS — R0602 Shortness of breath: Secondary | ICD-10-CM | POA: Diagnosis not present

## 2021-04-16 DIAGNOSIS — E785 Hyperlipidemia, unspecified: Secondary | ICD-10-CM

## 2021-04-16 DIAGNOSIS — N179 Acute kidney failure, unspecified: Secondary | ICD-10-CM | POA: Diagnosis not present

## 2021-04-16 DIAGNOSIS — G546 Phantom limb syndrome with pain: Secondary | ICD-10-CM | POA: Diagnosis present

## 2021-04-16 DIAGNOSIS — I639 Cerebral infarction, unspecified: Secondary | ICD-10-CM | POA: Diagnosis not present

## 2021-04-16 DIAGNOSIS — E039 Hypothyroidism, unspecified: Secondary | ICD-10-CM

## 2021-04-16 LAB — CBC
HCT: 39.2 % (ref 36.0–46.0)
Hemoglobin: 13 g/dL (ref 12.0–15.0)
MCH: 31.4 pg (ref 26.0–34.0)
MCHC: 33.2 g/dL (ref 30.0–36.0)
MCV: 94.7 fL (ref 80.0–100.0)
Platelets: 167 10*3/uL (ref 150–400)
RBC: 4.14 MIL/uL (ref 3.87–5.11)
RDW: 12.3 % (ref 11.5–15.5)
WBC: 7.8 10*3/uL (ref 4.0–10.5)
nRBC: 0 % (ref 0.0–0.2)

## 2021-04-16 LAB — BASIC METABOLIC PANEL
Anion gap: 7 (ref 5–15)
BUN: 33 mg/dL — ABNORMAL HIGH (ref 8–23)
CO2: 28 mmol/L (ref 22–32)
Calcium: 8.9 mg/dL (ref 8.9–10.3)
Chloride: 105 mmol/L (ref 98–111)
Creatinine, Ser: 1.92 mg/dL — ABNORMAL HIGH (ref 0.44–1.00)
GFR, Estimated: 24 mL/min — ABNORMAL LOW (ref >60)
Glucose, Bld: 139 mg/dL — ABNORMAL HIGH (ref 70–99)
Potassium: 3.9 mmol/L (ref 3.5–5.1)
Sodium: 140 mmol/L (ref 135–145)

## 2021-04-16 LAB — MAGNESIUM: Magnesium: 2.1 mg/dL (ref 1.7–2.4)

## 2021-04-16 MED ORDER — VITAMIN D 25 MCG (1000 UNIT) PO TABS
1000.0000 [IU] | ORAL_TABLET | Freq: Every day | ORAL | Status: DC
  Administered 2021-04-16 – 2021-04-25 (×8): 1000 [IU] via ORAL
  Filled 2021-04-16 (×9): qty 1

## 2021-04-16 MED ORDER — PREGABALIN 50 MG PO CAPS
50.0000 mg | ORAL_CAPSULE | Freq: Every day | ORAL | Status: DC
  Administered 2021-04-16: 21:00:00 50 mg via ORAL
  Filled 2021-04-16: qty 1

## 2021-04-16 MED ORDER — CARVEDILOL 3.125 MG PO TABS
3.1250 mg | ORAL_TABLET | Freq: Two times a day (BID) | ORAL | Status: DC
  Administered 2021-04-16 (×2): 3.125 mg via ORAL
  Filled 2021-04-16 (×2): qty 1

## 2021-04-16 MED ORDER — MIRTAZAPINE 15 MG PO TABS
15.0000 mg | ORAL_TABLET | Freq: Every day | ORAL | Status: DC
  Administered 2021-04-16: 15 mg via ORAL
  Filled 2021-04-16: qty 1

## 2021-04-16 MED ORDER — OCUVITE-LUTEIN PO CAPS
1.0000 | ORAL_CAPSULE | Freq: Every evening | ORAL | Status: DC
  Administered 2021-04-16 – 2021-04-24 (×7): 1 via ORAL
  Filled 2021-04-16 (×10): qty 1

## 2021-04-16 MED ORDER — LEVOTHYROXINE SODIUM 88 MCG PO TABS
88.0000 ug | ORAL_TABLET | Freq: Every day | ORAL | Status: DC
  Administered 2021-04-19 – 2021-04-25 (×6): 88 ug via ORAL
  Filled 2021-04-16 (×8): qty 1

## 2021-04-16 MED ORDER — PRESERVISION AREDS 2 PO CAPS: ORAL_CAPSULE | Freq: Every evening | ORAL | Status: DC

## 2021-04-16 MED ORDER — GABAPENTIN 100 MG PO CAPS: 100.0000 mg | ORAL_CAPSULE | Freq: Three times a day (TID) | ORAL | Status: DC

## 2021-04-16 MED ORDER — GABAPENTIN 100 MG PO CAPS
100.0000 mg | ORAL_CAPSULE | Freq: Three times a day (TID) | ORAL | Status: DC
  Administered 2021-04-16 (×2): 100 mg via ORAL
  Filled 2021-04-16 (×3): qty 1

## 2021-04-16 MED ORDER — ACETAMINOPHEN 325 MG PO TABS
650.0000 mg | ORAL_TABLET | Freq: Four times a day (QID) | ORAL | Status: DC | PRN
  Administered 2021-04-16 – 2021-04-19 (×3): 650 mg via ORAL
  Filled 2021-04-16 (×3): qty 2

## 2021-04-16 MED ORDER — AMLODIPINE BESYLATE 5 MG PO TABS
5.0000 mg | ORAL_TABLET | Freq: Every day | ORAL | Status: DC
  Administered 2021-04-16: 5 mg via ORAL
  Filled 2021-04-16: qty 1

## 2021-04-16 MED ORDER — SODIUM CHLORIDE 0.9 % IV BOLUS
500.0000 mL | Freq: Once | INTRAVENOUS | Status: AC
  Administered 2021-04-16: 500 mL via INTRAVENOUS

## 2021-04-16 NOTE — Evaluation (Signed)
Occupational Therapy Evaluation Patient Details Name: [name] MRN: [id number] DOB: 05-May-1925 Today's Date: 04/16/2021   History of Present Illness Pt is admitted for CVA, positive through L BG and occipital CVA. Pt is s/p TNK on 1/3. History includes R frontal/temporal craniotomy, Afib, R LE AKA, DM, and HTN.   Clinical Impression   Pt seen for OT evaluation this date d/t CVA. She presents this date with slurred speech, decreased R field vision (difficult to confirm d/t cognition), and decreased R UE strength/coordination. She requires moderate cues for sequencing of simple tasks and simple mobilization for most of session. She requires MOD A +2 for line/lead mgt for bed level rolling to address wet bedding as well as MAX A for peri care. She requires MIN/MOD A to come to EOB sitting and MIN A to sustain static sitting balance. Her son reports that this is an improvement over her performance this AM and she appears to be more awake/alert/engaged than she was with PT this am. Her balance is still not ideal for standing/transfer attempts. She is able to return to bed with MIN A and requires MOD A for repositioning. She is able to state she is at the hospital (when given 3 options) and able to state "stroke" with moderate clarity when asked why she is here. She is left with all needs met and in reach with CNA presenting for VS check. Will continue to follow acutely. Ed with pt's son re: d/c recs and he is in agreement with STR recommendation.     Recommendations for follow up therapy are one component of a multi-disciplinary discharge planning process, led by the attending physician.  Recommendations may be updated based on patient status, additional functional criteria and insurance authorization.   Follow Up Recommendations  Skilled nursing-short term rehab (<3 hours/day)    Assistance Recommended at Discharge Frequent or constant Supervision/Assistance  Patient can return home with the  following      Functional Status Assessment  Patient has had a recent decline in their functional status and demonstrates the ability to make significant improvements in function in a reasonable and predictable amount of time.  Equipment Recommendations  Hospital bed    Recommendations for Other Services       Precautions / Restrictions Precautions Precautions: Fall Restrictions Weight Bearing Restrictions: No Other Position/Activity Restrictions: R LE AKA chronic      Mobility Bed Mobility Overal bed mobility: Needs Assistance Bed Mobility: Supine to Sit;Rolling Rolling: Mod assist;+2 for safety/equipment   Supine to sit: Min assist;Mod assist;HOB elevated     General bed mobility comments: Pt slightly more participatory, using bed rails to come to sitting, but demos P sitting balance EOB    Transfers                   General transfer comment: unsafe/deferred      Balance Overall balance assessment: Needs assistance Sitting-balance support: Feet supported;Bilateral upper extremity supported Sitting balance-[name] Scale: Poor Sitting balance - Comments: Pt appears slightly more engaged than during PT assessment, she makes good effort to sit up EOB, she requires only minimal assist to sustain static sit       Standing balance comment: deferred                           ADL either performed or assessed with clinical judgement   ADL Overall ADL's : Needs assistance/impaired  General ADL Comments: MIN/MOD A for UB ADLs d/t decreased R side coordination/ROM/strength, MAX/TOTAL A for LB ADLs bed level.     Vision Baseline Vision/History: 1 Wears glasses Vision Assessment?: Yes Eye Alignment: Within Functional Limits Ocular Range of Motion: Within Functional Limits Alignment/Gaze Preference: Within Defined Limits Tracking/Visual Pursuits: Impaired - to be further tested in functional  context;Other (comment) (decreased vision/visual attn to R hemisphere, difficult to assess 2/2 cognition) Additional Comments: some difficulty following commands for vision assessment, but appears to have decreased R vision     Perception     Praxis      Pertinent Vitals/Pain Pain Assessment: Faces Faces Pain Scale: Hurts little more Pain Location: phantom leg pain per Son Pain Descriptors / Indicators: Constant;Grimacing Pain Intervention(s): Monitored during session;Repositioned     Hand Dominance Right   Extremity/Trunk Assessment Upper Extremity Assessment Upper Extremity Assessment: Generalized weakness;RUE deficits/detail;LUE deficits/detail RUE Deficits / Details: limited shld AROM to ~1/4 range and grasp/pincer strength of only 3+/5 LUE Deficits / Details: generally weak, but slightly increased grasp strength of 4-/5   Lower Extremity Assessment Lower Extremity Assessment: Generalized weakness;Defer to PT evaluation (h/o R AKA)       Communication Communication Communication: No difficulties (Per family, aphasia is improving)   Cognition Arousal/Alertness: Lethargic;Awake/alert Behavior During Therapy: WFL for tasks assessed/performed Overall Cognitive Status: Within Functional Limits for tasks assessed                                 General Comments: initially very lethargic, but perks up with stimuli and her son who is present in the room reports that this is the most lively he has seen her in 2-3 days.     General Comments       Exercises Other Exercises Other Exercises: OT engages family in ed re: d/c/DME recs   Shoulder Instructions      Home Living Family/patient expects to be discharged to:: Private residence Living Arrangements: Children Available Help at Discharge: Family;Available 24 hours/day Type of Home: House Home Access: Ramped entrance     Home Layout: One level               Home Equipment: Wheelchair -  manual;BSC/3in1          Prior Functioning/Environment Prior Level of Function : Independent/Modified Independent             Mobility Comments: Pt performs SPT between surfaces. Pt self propels WC. Reports no falls. ADLs Comments: Takes bird baths, feeds self. Family assist with IADLs        OT Problem List: Decreased strength;Decreased range of motion;Decreased activity tolerance;Decreased safety awareness;Decreased coordination;Impaired UE functional use;Impaired vision/perception      OT Treatment/Interventions: Self-care/ADL training;Therapeutic exercise;Neuromuscular education;DME and/or AE instruction;Therapeutic activities;Patient/family education;Balance training    OT Goals(Current goals can be found in the care plan section) Acute Rehab OT Goals Patient Stated Goal: to get better/stronger/participate wtih therapy OT Goal Formulation: With family Time For Goal Achievement: 04/30/21 Potential to Achieve Goals: Good ADL Goals Pt Will Perform Grooming: with mod assist;sitting (modified with use of R hand as stabilizer) Pt Will Transfer to Toilet: with mod assist;with +2 assist;squat pivot transfer;bedside commode (with son competent in assisting) Additional ADL Goal #1: Pt will complete bimanual task with moderate assist with 80% success. Additional ADL Goal #2: Pt will demonstrate improved visual scanning by eating from tray in her R visual field with <10% verbal/tactile  cues to locate items  OT Frequency: Min 4X/week    Co-evaluation              AM-PAC OT "6 Clicks" Daily Activity     Outcome Measure Help from another person eating meals?: A Little Help from another person taking care of personal grooming?: A Little Help from another person toileting, which includes using toliet, bedpan, or urinal?: A Lot Help from another person bathing (including washing, rinsing, drying)?: A Lot Help from another person to put on and taking off regular upper body  clothing?: A Little Help from another person to put on and taking off regular lower body clothing?: A Lot 6 Click Score: 15   End of Session Nurse Communication: Mobility status  Activity Tolerance: Patient tolerated treatment well;Patient limited by fatigue Patient left: in bed;with call bell/phone within reach;with bed alarm set;with nursing/sitter in room;with family/visitor present  OT Visit Diagnosis: Unsteadiness on feet (R26.81);Muscle weakness (generalized) (M62.81)                Time: 3810-1751 OT Time Calculation (min): 47 min Charges:  OT General Charges $OT Visit: 1 Visit OT Evaluation $OT Eval Moderate Complexity: 1 Mod OT Treatments $Self Care/Home Management : 23-37 mins $Therapeutic Activity: 8-22 mins  Gerrianne Scale, MS, OTR/L ascom [phone] 04/16/21, 5:31 PM

## 2021-04-16 NOTE — Progress Notes (Signed)
Patient ID: [name], female   DOB: 06/19/1925, 88 y.o.   MRN: [id number] [facility] PROGRESS NOTE  Taelor Waymire Kutsch PRF:[id number] DOB: 09/01/1925 DOA: 04/14/2021 PCP: Sofie Hartigan, MD  HPI/Subjective: Patient feels a little bit better.  Some shortness of breath.  No cough.  Admitted with aphasia and found to have an acute stroke and given TNKase.  Patient complained to the nurse about phantom pain.  Daughter states that she takes both gabapentin and Lyrica.  Objective: Vitals:   04/16/21 0859 04/16/21 1211  BP: (!) 184/72 (!) 194/92  Pulse: (!) 56 75  Resp: 17 17  Temp: 97.9 F (36.6 C) 98.5 F (36.9 C)  SpO2: 99% 95%    Intake/Output Summary (Last 24 hours) at 04/16/2021 1255 Last data filed at 04/16/2021 1019 Gross per 24 hour  Intake 240 ml  Output 400 ml  Net -160 ml   Filed Weights   04/14/21 1604  Weight: 62.1 kg    ROS: Review of Systems  Respiratory:  Positive for shortness of breath. Negative for cough.   Cardiovascular:  Negative for chest pain.  Gastrointestinal:  Negative for abdominal pain, nausea and vomiting.  Exam: Physical Exam HENT:     Head: Normocephalic.     Mouth/Throat:     Pharynx: No oropharyngeal exudate.  Eyes:     General: Lids are normal.     Conjunctiva/sclera: Conjunctivae normal.  Cardiovascular:     Rate and Rhythm: Normal rate. Rhythm irregularly irregular.     Heart sounds: Normal heart sounds, S1 normal and S2 normal.  Pulmonary:     Breath sounds: Normal breath sounds. No decreased breath sounds, wheezing, rhonchi or rales.  Abdominal:     Palpations: Abdomen is soft.     Tenderness: There is no abdominal tenderness.  Musculoskeletal:     Right upper leg: No swelling.     Left lower leg: Swelling present.  Skin:    General: Skin is warm.     Findings: No rash.  Neurological:     Mental Status: She is alert.     Comments: Answers questions appropriately.  Able to straight leg raise.      Scheduled Meds:   amLODipine  5 mg Oral Daily   atorvastatin  40 mg Oral Daily   carvedilol  3.125 mg Oral BID   cholecalciferol  1,000 Units Oral Daily   gabapentin  100 mg Oral TID   [START ON 04/17/2021] levothyroxine  88 mcg Oral QAC breakfast   lidocaine  1 patch Transdermal Q24H   mirtazapine  15 mg Oral QHS   pregabalin  50 mg Oral QHS   PreserVision AREDS 2   Oral QPM   sodium chloride flush  3 mL Intravenous Once     Assessment/Plan:  Embolic stroke with persistent atrial fibrillation.  Patient presented with aphasia.  Patient was on aspirin and Plavix at home.  Patient given TNKase.  Currently on aspirin.  Neurology recommends starting Eliquis in about 2 weeks.  MRI showing patchy multifocal acute ischemic infarcts involving the left basal ganglia and posterior left parietal occipital region.  Continue atorvastatin.  LDL 110.  PT and OT consultations.  Case discussed with neurology today. Accelerated hypertension restart low-dose Coreg.  Add Norvasc 5 mg daily.  Was on clevidipine drip. Shortness of breath we will obtain a chest x-ray.  Patient is on Lasix at home. Acute kidney injury on chronic kidney disease stage IIIb.  Baseline creatinine around 1.33.  Creatinine  upon admission 1.43 and went up to 1.92 today.  We will give a fluid bolus.  Recheck BMP tomorrow. Hyperlipidemia unspecified on atorvastatin. Hypothyroidism unspecified on levothyroxine Phantom pain right lower extremity.  Patient has a Lidoderm patch on.  Ordered Tylenol as needed.  Restart gabapentin and Lyrica.      Code Status:     Code Status Orders  (From admission, onward)           Start     Ordered   04/14/21 1748  Do not attempt resuscitation (DNR)  Continuous       Question Answer Comment  In the event of cardiac or respiratory ARREST Do not call a code blue   In the event of cardiac or respiratory ARREST Do not perform Intubation, CPR, defibrillation or ACLS   In the event of cardiac or respiratory ARREST  Use medication by any route, position, wound care, and other measures to relive pain and suffering. May use oxygen, suction and manual treatment of airway obstruction as needed for comfort.      04/14/21 1748           Code Status History     Date Active Date Inactive Code Status Order ID Comments User Context   10/22/2019 0837 10/25/2019 1757 DNR [id number]  Sela Hua, PA-C Inpatient   10/03/2019 1720 10/22/2019 0837 Full Code [id number]  Leonides Schanz Inpatient   07/31/2019 1230 08/01/2019 2228 Full Code [id number]  Leonides Schanz Inpatient   03/01/2019 1604 03/02/2019 2136 Full Code [id number]  Katha Cabal, MD Inpatient   06/13/2018 1331 06/13/2018 1936 Full Code [id number]  Katha Cabal, MD Inpatient   08/12/2017 1158 08/12/2017 1700 Full Code [id number]  Schnier, Dolores Lory, MD Inpatient      Family Communication: Spoke with patient's daughter on the phone Disposition Plan: Status is: [place]  [facility]

## 2021-04-16 NOTE — Progress Notes (Addendum)
Speech Language Pathology Treatment: Dysphagia  Patient Details Name: [name] MRN: [id number] DOB: 04-Jul-1925 Today's Date: 04/16/2021 Time: 5102-5852 SLP Time Calculation (min) (ACUTE ONLY): 35 min  Assessment / Plan / Recommendation Clinical Impression  Pt seen for ongoing assessment of swallowing. She is awake w/ few verbalizations but low volume. She exhibited interest in wanting ice cream. Noted, and Son endorsed, discomfort from phantom leg pain; NSG is getting an order for pain medication from MD. Dentures in place. She engaged somewhat during session but needed support overall and support w/ feeding d/t overall weakness and discomfort. She was able to make wants/needs known and follow basic instructions.  Pt explained general aspiration precautions and agreed verbally to the need for following them especially sitting upright for all oral intake -- supported behind the back for full upright sitting. Pt required full assistance w/ positioning d/t weakness. She fed herself holding the Cup thin liquids via Cup, small sips, then puree and ice cream. No overt clinical s/s of aspiration were noted; respiratory status remained calm and unlabored, vocal quality clear b/t trials when assessed. Oral phase appeared grossly [facility] for bolus management and timely A-P transfer for swallowing; oral clearing achieved w/ all consistencies. Son denied any deficits in swallowing at [facility] last night as well. Pt required Help feeding self d/t weakness.   Pt appears at reduced risk for aspriation when following general aspiration precautions and when given Support w/ feeding d/t weakness. Recommend continue a mech soft diet for ease of soft foods w/ gravies added to moisten foods; Thin liquids VIA CUP -- no straws. Recommend general aspiration precautions; Pills Whole in Puree for ease and safety of swallowing; tray setup and positioning assistance for meals. Feeding assistance to support. No further acute,  skilled ST services indicated; regarding any speech needs to be addressed, recommend ST f/u post discharge when pt's weakness and pain have improved for better engagement in services. ST services will sign off at this time w/ MD to reconsult if needed while admitted. NSG updated. Precautions posted at bedside.     HPI HPI: Pt 88yo female w/ a past medical of atrial fibrillation not on anticoagulation due to fall risk, PAD s/p RLE amputation, hypothyroidism, type II diabetes and hypertension, Afib, CHF, HLD; also c/o s/s of Reflux who presented to the ED this afternoon with acute onset of aphasia that initially improved but then around 1330 began again and was brought to the ED and a code stroke was initiated.   CT CODE Stroke with no hemorrhage or LVO. Patient does have a history of prior SDH following a fall years back. After discussion between neurology team and family, decision was made to move forward with TNK administration.  MRI and Head CT Post TNK: Patchy multifocal acute ischemic infarcts involving the left  basal ganglia and posterior left parieto-occipital region as above.  No associated hemorrhage or mass effect;  Recent infarction of the left  basal ganglia is more conspicuous on this examination.  Cerebral atrophy and chronic microvascular ischemic changes of  the white matter.  Postsurgical changes for right frontal/parietal craniectomy.      SLP Plan  All goals met      Recommendations for follow up therapy are one component of a multi-disciplinary discharge planning process, led by the attending physician.  Recommendations may be updated based on patient status, additional functional criteria and insurance authorization.    Recommendations  Diet recommendations: Dysphagia 3 (mechanical soft);Thin liquid (chopped meats, gravies) Liquids  provided via: Cup;No straw Medication Administration: Whole meds with puree Supervision: Patient able to self feed;Staff to assist with self  feeding;Intermittent supervision to cue for compensatory strategies (support d/t weakness) Compensations: Minimize environmental distractions;Slow rate;Small sips/bites Postural Changes and/or Swallow Maneuvers: Out of bed for meals;Seated upright 90 degrees;Upright 30-60 min after meal                General recommendations:  (Dietician f/u if needed) Oral Care Recommendations: Oral care BID;Oral care before and after PO;Staff/trained caregiver to provide oral care (Denture care -- supplies left w/ Son at bedside) Follow Up Recommendations: No SLP follow up Assistance recommended at discharge: Set up Supervision/Assistance SLP Visit Diagnosis: Dysphagia, unspecified (R13.10) Plan: All goals met            Orinda Kenner, MS, CCC-SLP Speech Language Pathologist Rehab Services [phone] [facility]  04/16/2021, 2:22 PM

## 2021-04-16 NOTE — TOC Initial Note (Signed)
Transition of Care [facility]) - Initial/Assessment Note    Patient Details  Name: [name] MRN: [id number] Date of Birth: [birth year]  Transition of Care [facility]) CM/SW Contact:    Pete Pelt, RN Phone Number: 04/16/2021, 10:56 AM  Clinical Narrative:     Spoke to daughter, patient lives at home with niece.  Patient is independent at home, can even sweep floor and care for her house from her wheelchair.  She does not use stove or cook when she is by herself.  Family assists with meals.  Daughter states patient has been wheelschair bound since leg amputation but is very functional from wheelchair, she is able to transfer to bed and toilet on her own and daughter feels she is safe.  Daughter states that patient has quite a bit of family support at home, daughter, son, niece and others as needed.  PT/OT to see patient, as they were medically unable yesterday.  Discussed that we are awaiting recommendations but family would like to discuss with patient as they would like her to have the final decision.  TOC contact information provided, TOC to follow to discharge.            Expected Discharge Plan:  (TBD) Barriers to Discharge: Continued Medical Work up   Patient Goals and CMS Choice        Expected Discharge Plan and Services Expected Discharge Plan:  (TBD)       Living arrangements for the past 2 months: Single Family Home                                      Prior Living Arrangements/Services Living arrangements for the past 2 months: Single Family Home Lives with:: Self, Other (Comment) (niece) Patient language and need for interpreter reviewed:: Yes (no interpreter needed) Do you feel safe going back to the place where you live?: Yes (with family support unless other recommendations)      Need for Family Participation in Patient Care: Yes (Comment) Care giver support system in place?: Yes (comment)   Criminal Activity/Legal Involvement Pertinent to Current  Situation/Hospitalization: No - Comment as needed  Activities of Daily Living Home Assistive Devices/Equipment: None ADL Screening (condition at time of admission) Patient's cognitive ability adequate to safely complete daily activities?: Yes Is the patient deaf or have difficulty hearing?: No Does the patient have difficulty seeing, even when wearing glasses/contacts?: Yes Does the patient have difficulty concentrating, remembering, or making decisions?: Yes Patient able to express need for assistance with ADLs?: Yes Does the patient have difficulty dressing or bathing?: Yes Independently performs ADLs?: No Does the patient have difficulty walking or climbing stairs?: Yes Weakness of Legs: Left Weakness of Arms/Hands: None  Permission Sought/Granted Permission sought to share information with : Case Manager Permission granted to share information with : Yes, Verbal Permission Granted              Emotional Assessment Appearance:: Appears stated age Attitude/Demeanor/Rapport: Gracious Affect (typically observed): Pleasant Orientation: : Oriented to Self Alcohol / Substance Use: Not Applicable Psych Involvement: No (comment)  Admission diagnosis:  Stroke [facility]) [I63.9] Cerebrovascular accident (CVA), unspecified mechanism ([place]) [I63.9] Hypertension, unspecified type [I10] Patient Active Problem List   Diagnosis Date Noted   Stroke [facility]) 04/14/2021   Insomnia 02/16/2021   Palliative care by specialist    DNR (do not resuscitate)    Advanced directive placed in chart  this admission    Advanced directives, counseling/discussion    Weakness    Goals of care, counseling/discussion    Protein-calorie malnutrition, severe 10/12/2019   Ischemia of right lower extremity 10/03/2019   Ischemia of extremity 07/31/2019   Proteinuria 04/23/2019   Atherosclerosis of native arteries of the extremities with ulceration ([place]) 03/01/2019   Chronic vertebral fracture due to osteoporosis  ([place]) 05/20/2018   Closed T10 fracture ([place]) 05/20/2018   Gout 03/27/2018   Hypothyroidism 03/27/2018   Subdural hematoma 03/08/2018   Abnormal SPEP 12/07/2017   History of vitamin D deficiency 11/30/2017   Hyperlipidemia associated with type 2 diabetes mellitus ([place]) 11/30/2017   PAD (peripheral artery disease) ([place]) 07/19/2017   Leg pain, lateral, left 07/19/2017   Diabetes ([place]) 07/19/2017   Hyperlipidemia 07/19/2017   Essential hypertension 07/19/2017   Mild aortic valve stenosis 06/22/2017   Persistent atrial fibrillation ([place]) 06/22/2017   Moderate mitral insufficiency 10/30/2015   Chronic kidney disease, stage 3 unspecified ([place]) 09/19/2014   Type II or unspecified type diabetes mellitus with renal manifestations, not stated as uncontrolled(250.40) 11/08/2013   Vitamin D deficiency 11/08/2013   PCP:  Sofie Hartigan, MD Pharmacy:   [facility], [place] [address] [place] [address] Phone: [phone] Fax: [phone]     Social Determinants of Health (SDOH) Interventions    Readmission Risk Interventions No flowsheet data found.

## 2021-04-16 NOTE — Progress Notes (Signed)
NAME:  [name], MRN:  [id number], DOB:  05-11-1925, LOS: 2 ADMISSION DATE:  04/14/2021, CONSULTATION DATE:  04/14/2021 REFERRING MD:  ED, CHIEF COMPLAINT:  Aphasia   History of Present Illness:  88yo female w/ a past medical of atrial fibrillation not on anticoagulation due to fall risk, PAD s/p RLE amputation, hypothyroidism, type II diabetes and hypertension who presented to the ED this afternoon with acute onset of aphasia that initially improved but then around 1330 began again and was brought to the ED and a code stroke was initiated.   CT CODE Stroke with no hemorrhage or LVO. Patient does have a history of prior SDH following a fall years back. After discussion between neurology team and family, decision was made to move forward with TNK administration.   Critical care was called for ICU admission. Patient seen and examined in the ED. TNK infusing. VSS. Patient is alert but w/expressive aphasia. Attempted to state name but then shook head no that she could not speak. Able to follow commands weakly. Difficult to obtain full NIHSS given degree of symptoms. Patients son and daughter at bedside. Son states she has been in her usual state of health and did 3 loads of laundry today. He states baseline she is independent with no neurological deficits.   Chart reviewed. Significant lab values include: K 3, Cr 1.43, Glucose 180  04/15/20- patient is sleeping during my evaluation this am. Her family is with her at bedside. She is on room air and off all infusions with normal vital signs and HR 50-60.  Plan to transfer to medical floor with TRH post 24h neurocheck period is over.    04/16/20- Patient is improved. There is plan to initiate eliquis for AF, however this patient is 88yo with previous subdural bleed, may predispose to bleeding.  PCCM will sign off today due to medical stability and I have reviewed medical plan with Dr Leslye Peer [facility] attending physician.   Pertinent  Medical History  PAD RLE  amputation Hypertension Hyperlipidemia Atrial fibrillation  Significant Hospital Events: Including procedures, antibiotic start and stop dates in addition to other pertinent events   1/3 CODE Stroke s/p TNK Administration  Interim History / Subjective:  88yo female who presented 1/3 with stroke like symptoms including aphasia s/p TNK administration.   Objective   Blood pressure (!) 184/72, pulse (!) 56, temperature 97.9 F (36.6 C), temperature source Oral, resp. rate 17, weight 62.1 kg, SpO2 99 %.        Intake/Output Summary (Last 24 hours) at 04/16/2021 0900 Last data filed at 04/16/2021 0500 Gross per 24 hour  Intake --  Output 400 ml  Net -400 ml   Filed Weights   04/14/21 1604  Weight: 62.1 kg    Examination: General: NAD age appropriate HENT: Pupils equal and reactive Lungs: Lungs clear Cardiovascular: Irregular rhythm Abdomen: Soft, non-tender Extremities: RLE amputation Neuro: Alert but aphasic. Able to follow commands weakly  Resolved Hospital Problem list   NA  Assessment & Plan:  Acute Ischemic Stroke S/P TNK --Suspect cardioembolic given known afib not on anticoagulation --Neurology following --Post TPA protocol --Frequent neuro checks --SBP <180 --Stroke workup (Lipid profile, HgbA1c, ECHO) --sp Repeat CTH 24hr post TNK --MRI per neuro --PT/OT/Speech to evaluate  Hypertension Hyperlipidemia --Home medications: ASA, lisinopril, lovastatin, coreg --Hold home medications for now  Atrial fibrillation     On medical mgt  PAD --Home medications: Plavix --Hold for now s/p lytics  Hypothyroidism --Continue home levothyroxine 82mcg  Type II Diabetes --SSI  Hypokalemia --K 3.0 --KCL 40 meq ordered IV  Best Practice (right click and "Reselect all SmartList Selections" daily)   Diet/type: NPO DVT prophylaxis: other GI prophylaxis: N/A Lines: N/A Foley:  N/A Code Status:  DNR Last date of multidisciplinary goals of care discussion  [Patients daughter and son updated at bedside]  Labs   CBC: Recent Labs  Lab 04/14/21 1600 04/15/21 0748 04/16/21 0647  WBC 6.6 7.7 7.8  NEUTROABS 4.3  --   --   HGB 16.1* 13.4 13.0  HCT 46.1* 38.8 39.2  MCV 92.4 92.2 94.7  PLT 188 179 167     Basic Metabolic Panel: Recent Labs  Lab 04/14/21 1600 04/15/21 0748 04/16/21 0647  NA 140 138 140  K 3.0* 4.0 3.9  CL 101 101 105  CO2 30 26 28   GLUCOSE 180* 220* 139*  BUN 23 25* 33*  CREATININE 1.43* 1.58* 1.92*  CALCIUM 9.5 8.4* 8.9  MG  --  1.9 2.1  PHOS  --  4.2  --     GFR: Estimated Creatinine Clearance: 16.5 mL/min (A) (by C-G formula based on SCr of 1.92 mg/dL (H)). Recent Labs  Lab 04/14/21 1600 04/15/21 0748 04/16/21 0647  WBC 6.6 7.7 7.8     Liver Function Tests: Recent Labs  Lab 04/14/21 1600  AST 20  ALT 13  ALKPHOS 91  BILITOT 0.8  PROT 8.0  ALBUMIN 3.8    No results for input(s): LIPASE, AMYLASE in the last 168 hours. No results for input(s): AMMONIA in the last 168 hours.  ABG No results found for: PHART, PCO2ART, PO2ART, HCO3, TCO2, ACIDBASEDEF, O2SAT   Coagulation Profile: Recent Labs  Lab 04/14/21 1600  INR 1.1     Cardiac Enzymes: No results for input(s): CKTOTAL, CKMB, CKMBINDEX, TROPONINI in the last 168 hours.  HbA1C: Hgb A1c MFr Bld  Date/Time Value Ref Range Status  04/14/2021 04:00 PM 7.7 (H) 4.8 - 5.6 % Final    Comment:    (NOTE) Pre diabetes:          5.7%-6.4%  Diabetes:              >6.4%  Glycemic control for   <7.0% adults with diabetes   03/01/2019 06:41 PM 6.2 (H) 4.8 - 5.6 % Final    Comment:    (NOTE) Pre diabetes:          5.7%-6.4% Diabetes:              >6.4% Glycemic control for   <7.0% adults with diabetes     CBG: Recent Labs  Lab 04/14/21 1532 04/14/21 1554  GLUCAP 159* 158*     Review of Systems:   Unable to assess due to condition  Past Medical History:  She,  has a past medical history of A-fib ([place]), CHF (congestive  heart failure) ([place]), Diabetes mellitus without complication ([place]), and Hypertension.   Surgical History:   Past Surgical History:  Procedure Laterality Date   AMPUTATION Right 10/10/2019   Procedure: AMPUTATION ABOVE KNEE;  Surgeon: Katha Cabal, MD;  Location: [facility] ORS;  Service: Vascular;  Laterality: Right;   CHOLECYSTECTOMY     LOWER EXTREMITY ANGIOGRAPHY Right 08/12/2017   Procedure: LOWER EXTREMITY ANGIOGRAPHY;  Surgeon: Katha Cabal, MD;  Location: [place] CV LAB;  Service: Cardiovascular;  Laterality: Right;   LOWER EXTREMITY ANGIOGRAPHY Right 06/13/2018   Procedure: LOWER EXTREMITY ANGIOGRAPHY;  Surgeon: Katha Cabal, MD;  Location: [place]  CV LAB;  Service: Cardiovascular;  Laterality: Right;   LOWER EXTREMITY ANGIOGRAPHY Right 03/01/2019   Procedure: LOWER EXTREMITY ANGIOGRAPHY;  Surgeon: Katha Cabal, MD;  Location: [place] CV LAB;  Service: Cardiovascular;  Laterality: Right;   LOWER EXTREMITY ANGIOGRAPHY Right 07/31/2019   Procedure: LOWER EXTREMITY ANGIOGRAPHY;  Surgeon: Katha Cabal, MD;  Location: [place] CV LAB;  Service: Cardiovascular;  Laterality: Right;   LOWER EXTREMITY ANGIOGRAPHY Right 10/04/2019   Procedure: Lower Extremity Angiography;  Surgeon: Katha Cabal, MD;  Location: [place] CV LAB;  Service: Cardiovascular;  Laterality: Right;     Social History:   reports that she has never smoked. She has never used smokeless tobacco. She reports that she does not drink alcohol and does not use drugs.   Family History:  Her family history includes Cancer in her brother.   Allergies Allergies  Allergen Reactions   Sulfa Antibiotics Hives     Home Medications  Prior to Admission medications   Medication Sig Start Date End Date Taking? Authorizing Provider  ACCU-CHEK AVIVA PLUS test strip  04/24/19   [provider]  acetaminophen (TYLENOL) 325 MG tablet Take 325-650 mg by mouth every 6 (six)  hours as needed for headache.     [provider]  allopurinol (ZYLOPRIM) 100 MG tablet Take 100 mg by mouth daily. Patient not taking: Reported on 12/01/2020 03/23/17   [provider]  aspirin EC 81 MG EC tablet Take 1 tablet (81 mg total) by mouth daily. 08/02/19   Stegmayer, Janalyn Harder, PA-C  calcium carbonate (OSCAL) 1500 (600 Ca) MG TABS tablet Take by mouth daily with breakfast.    [provider]  carvedilol (COREG) 6.25 MG tablet Take 6.25 mg by mouth 2 (two) times daily with a meal.  06/27/19   [provider]  clopidogrel (PLAVIX) 75 MG tablet TAKE (1) TABLET BY MOUTH EVERY DAY 11/10/20   Schnier, Dolores Lory, MD  furosemide (LASIX) 20 MG tablet Take 20 mg by mouth 2 (two) times daily.  03/07/17   [provider]  gabapentin (NEURONTIN) 100 MG capsule Take 100-200 mg by mouth at bedtime as needed (pain).     [provider]  gabapentin (NEURONTIN) 100 MG capsule Take 100 mg by mouth 3 (three) times daily.    Jason Coop, NP  glucose blood (ACCU-CHEK AVIVA PLUS) test strip CHECK BLOOD SUGAR 3 TIMES DAILY 02/14/19   [provider]  HUMALOG KWIKPEN 100 UNIT/ML KiwkPen Inject 3 Units into the skin 3 (three) times daily after meals. Per sliding scale 04/27/17   [provider]  HYDROcodone-acetaminophen (NORCO/VICODIN) 5-325 MG tablet Take 1 tablet by mouth every 4 (four) hours as needed for moderate pain. Patient not taking: No sig reported    [provider]  LEVEMIR FLEXTOUCH 100 UNIT/ML Pen Inject 8 Units into the skin at bedtime.  06/15/17   [provider]  levothyroxine (SYNTHROID, LEVOTHROID) 88 MCG tablet Take 88 mcg by mouth daily before breakfast.  05/26/17   [provider]  lisinopril (ZESTRIL) 5 MG tablet Take 5 mg by mouth daily.  12/22/18   [provider]  LORazepam (ATIVAN) 0.5 MG tablet Take 0.5 mg by mouth every 6 (six) hours as needed for anxiety. Patient not  taking: Reported on 12/01/2020    [provider]  lovastatin (MEVACOR) 20 MG tablet Take 20 mg by mouth daily with supper.  05/02/17   [provider]  meclizine (ANTIVERT) 25  MG tablet Take 25 mg by mouth 3 (three) times daily as needed for dizziness. 04/20/17   [provider]  mirtazapine (REMERON) 15 MG tablet Take by mouth. 11/21/19 12/01/20  [provider]  Multiple Vitamins-Minerals (PRESERVISION AREDS 2 PO) Take 1 tablet by mouth every evening.    [provider]  oxyCODONE (OXY IR/ROXICODONE) 5 MG immediate release tablet Take 1-2 tablets (5-10 mg total) by mouth every 4 (four) hours as needed for moderate pain. Patient not taking: No sig reported 10/25/19   Stegmayer, Joelene Millin A, PA-C  potassium chloride SA (K-DUR,KLOR-CON) 20 MEQ tablet Take 20 mEq by mouth daily.  Patient not taking: No sig reported    [provider]  Vitamin D3 (VITAMIN D) 25 MCG tablet Take 1,000 Units by mouth daily.     [provider]     Critical care provider statement:   Total critical care time: 33 minutes   Performed by: Lanney Gins MD   Critical care time was exclusive of separately billable procedures and treating other patients.   Critical care was necessary to treat or prevent imminent or life-threatening deterioration.   Critical care was time spent personally by me on the following activities: development of treatment plan with patient and/or surrogate as well as nursing, discussions with consultants, evaluation of patient's response to treatment, examination of patient, obtaining history from patient or surrogate, ordering and performing treatments and interventions, ordering and review of laboratory studies, ordering and review of radiographic studies, pulse oximetry and re-evaluation of patient's condition.    Ottie Glazier, M.D.  Pulmonary & Critical Care Medicine

## 2021-04-16 NOTE — Progress Notes (Signed)
Subjective: Patient continues to have some improvement.   Exam: Vitals:   04/16/21 0537 04/16/21 0859  BP: (!) 166/61 (!) 184/72  Pulse: 60 (!) 56  Resp: 16 17  Temp: 98 F (36.7 C) 97.9 F (36.6 C)  SpO2: 92% 99%   Gen: In bed, NAD Resp: non-labored breathing, no acute distress Abd: soft, nt  Neuro: MS: Awake, alert, speech is slow, with few words but improved, she knows the month and year, she is able to name simple objects, some difficulty with repetition.  CN: Visual fields are full, EOMI without gaze preference Motor: She has no drift in either arm or her left leg, right AKA Sensory: Intact to light touch   Pertinent Labs: LDL is 110 A1c 7.7  Impression: 88 year old female with an embolic stroke in the setting of atrial fibrillation without anticoagulation.  Speaking with her son, she apparently was not much of a fall risk, given that she is wheelchair-bound.  I would favor holding anticoagulation for 2 weeks, but as long as she continues to not be a fall risk, I would favor starting one of the novel anticoagulants as a secondary preventative.    She is on dual antiplatelet therapy at home.  If she is started on anticoagulation, and needs antiplatelet therapy for something other than stroke prevention, I favor combining with only single antiplatelet if feasible.  Recommendations: 1) aspirin 81 mg daily, then start Eliquis after 2 weeks. 2) atorvastatin 40 mg daily 3) PT, OT, ST 5) DM control with goal LDL less than 7 6) neurology will be available on an as needed basis.   Roland Rack, MD [facility] [phone]  If 7pm- 7am, please page neurology on call as listed in [place].

## 2021-04-16 NOTE — Evaluation (Addendum)
Physical Therapy Evaluation Patient Details Name: [name] MRN: [id number] DOB: 20-Sep-1925 Today's Date: 04/16/2021  History of Present Illness  Pt is admitted for CVA, positive through L BG and occipital CVA. Pt is s/p TNK on 1/3. History includes R frontal/temporal craniotomy, Afib, R LE AKA, DM, and HTN.  Clinical Impression  Pt is a pleasant 88 year old female who was admitted for CVA s/p TNK. Pt performs bed mobility with mod assist and able to sit at EOB briefly, however needs total assist to maintain position. Unable to perform transfers at this time. Pt demonstrates deficits with strength/mobility/balance. Pt with intelligible speech, however is very fatigued. Poor finger to nose testing with difficulty reaching target. Pt is very removed from baseline as she typically transfers between surfaces with independence. Would benefit from skilled PT to address above deficits and promote optimal return to PLOF; recommend transition to STR upon discharge from acute hospitalization. Pt and family agreeable to SNF to assist for return to function.      Recommendations for follow up therapy are one component of a multi-disciplinary discharge planning process, led by the attending physician.  Recommendations may be updated based on patient status, additional functional criteria and insurance authorization.  Follow Up Recommendations Skilled nursing-short term rehab (<3 hours/day)    Assistance Recommended at Discharge Frequent or constant Supervision/Assistance  Patient can return home with the following  A lot of help with walking and/or transfers    Equipment Recommendations  (TBD)  Recommendations for Other Services       Functional Status Assessment Patient has had a recent decline in their functional status and/or demonstrates limited ability to make significant improvements in function in a reasonable and predictable amount of time     Precautions / Restrictions  Precautions Precautions: Fall Restrictions Weight Bearing Restrictions: No Other Position/Activity Restrictions: R LE AKA chronic      Mobility  Bed Mobility Overal bed mobility: Needs Assistance Bed Mobility: Supine to Sit     Supine to sit: Mod assist     General bed mobility comments: needs assist for trunkal elevation. Once seated, needs max assist to maintain seated balance. Assisted to return back in bed    Transfers                   General transfer comment: not safe to attempt    Ambulation/Gait                  Stairs            Wheelchair Mobility    Modified Rankin (Stroke Patients Only)       Balance Overall balance assessment: Needs assistance Sitting-balance support: Feet supported;Bilateral upper extremity supported Sitting balance-[name] Scale: Zero                                       Pertinent Vitals/Pain Pain Assessment: No/denies pain    Home Living Family/patient expects to be discharged to:: Private residence Living Arrangements: Children Available Help at Discharge: Family;Available 24 hours/day Type of Home: House Home Access: Ramped entrance       Home Layout: One level Home Equipment: Wheelchair - manual;BSC/3in1      Prior Function Prior Level of Function : Independent/Modified Independent             Mobility Comments: Pt performs SPT between surfaces. Pt self propels WC. Reports no  falls. ADLs Comments: Takes bird baths, feeds self. Family assist with IADLs     Hand Dominance        Extremity/Trunk Assessment   Upper Extremity Assessment Upper Extremity Assessment: Generalized weakness (difficulty with FTN testing)    Lower Extremity Assessment Lower Extremity Assessment: Generalized weakness (L LE grossly 3/5)       Communication   Communication: No difficulties (Per family, aphasia is improving)  Cognition Arousal/Alertness: Lethargic Behavior During Therapy:  WFL for tasks assessed/performed Overall Cognitive Status: Within Functional Limits for tasks assessed                                 General Comments: very sleepy this date per son        General Comments      Exercises     Assessment/Plan    PT Assessment Patient needs continued PT services  PT Problem List Decreased strength;Decreased balance;Decreased mobility;Decreased knowledge of use of DME       PT Treatment Interventions Functional mobility training;Balance training;Wheelchair mobility training    PT Goals (Current goals can be found in the Care Plan section)  Acute Rehab PT Goals Patient Stated Goal: to get OOB PT Goal Formulation: With patient/family Time For Goal Achievement: 04/30/21 Potential to Achieve Goals: Good    Frequency 7X/week     Co-evaluation               AM-PAC PT "6 Clicks" Mobility  Outcome Measure Help needed turning from your back to your side while in a flat bed without using bedrails?: A Lot Help needed moving from lying on your back to sitting on the side of a flat bed without using bedrails?: A Lot Help needed moving to and from a bed to a chair (including a wheelchair)?: Total Help needed standing up from a chair using your arms (e.g., wheelchair or bedside chair)?: Total Help needed to walk in hospital room?: Total Help needed climbing 3-5 steps with a railing? : Total 6 Click Score: 8    End of Session   Activity Tolerance: Patient tolerated treatment well Patient left: in bed;with chair alarm set;with family/visitor present Nurse Communication: Mobility status PT Visit Diagnosis: Unsteadiness on feet (R26.81);Muscle weakness (generalized) (M62.81)    Time: 0258-5277 PT Time Calculation (min) (ACUTE ONLY): 16 min   Charges:   PT Evaluation $PT Eval Low Complexity: 1 Low PT Treatments $Therapeutic Activity: 8-22 mins        Greggory Stallion, PT, DPT [phone]   [name] 04/16/2021, 1:00  PM

## 2021-04-17 ENCOUNTER — Inpatient Hospital Stay: Payer: Medicare Other

## 2021-04-17 DIAGNOSIS — G9341 Metabolic encephalopathy: Secondary | ICD-10-CM

## 2021-04-17 DIAGNOSIS — I10 Essential (primary) hypertension: Secondary | ICD-10-CM | POA: Diagnosis not present

## 2021-04-17 DIAGNOSIS — I63432 Cerebral infarction due to embolism of left posterior cerebral artery: Secondary | ICD-10-CM | POA: Diagnosis not present

## 2021-04-17 DIAGNOSIS — N179 Acute kidney failure, unspecified: Secondary | ICD-10-CM | POA: Diagnosis not present

## 2021-04-17 LAB — BASIC METABOLIC PANEL
Anion gap: 5 (ref 5–15)
BUN: 37 mg/dL — ABNORMAL HIGH (ref 8–23)
CO2: 28 mmol/L (ref 22–32)
Calcium: 8.7 mg/dL — ABNORMAL LOW (ref 8.9–10.3)
Chloride: 106 mmol/L (ref 98–111)
Creatinine, Ser: 2.06 mg/dL — ABNORMAL HIGH (ref 0.44–1.00)
GFR, Estimated: 22 mL/min — ABNORMAL LOW (ref >60)
Glucose, Bld: 150 mg/dL — ABNORMAL HIGH (ref 70–99)
Potassium: 3.8 mmol/L (ref 3.5–5.1)
Sodium: 139 mmol/L (ref 135–145)

## 2021-04-17 LAB — BLOOD GAS, ARTERIAL
Acid-Base Excess: 2.5 mmol/L — ABNORMAL HIGH (ref 0.0–2.0)
Bicarbonate: 27.9 mmol/L (ref 20.0–28.0)
FIO2: 0.21
O2 Saturation: 89.2 %
Patient temperature: 37
pCO2 arterial: 45 mmHg (ref 32.0–48.0)
pH, Arterial: 7.4 (ref 7.350–7.450)
pO2, Arterial: 57 mmHg — ABNORMAL LOW (ref 83.0–108.0)

## 2021-04-17 MED ORDER — AMLODIPINE BESYLATE 10 MG PO TABS
10.0000 mg | ORAL_TABLET | Freq: Every day | ORAL | Status: DC
  Administered 2021-04-18 – 2021-04-25 (×7): 10 mg via ORAL
  Filled 2021-04-17 (×9): qty 1

## 2021-04-17 MED ORDER — SODIUM CHLORIDE 0.9 % IV BOLUS
500.0000 mL | Freq: Once | INTRAVENOUS | Status: AC
  Administered 2021-04-17: 500 mL via INTRAVENOUS

## 2021-04-17 MED ORDER — ASPIRIN 81 MG PO CHEW
81.0000 mg | CHEWABLE_TABLET | Freq: Every day | ORAL | Status: DC
  Administered 2021-04-17 – 2021-04-25 (×8): 81 mg via ORAL
  Filled 2021-04-17 (×9): qty 1

## 2021-04-17 MED ORDER — SODIUM CHLORIDE 0.9 % IV SOLN: INTRAVENOUS | Status: DC

## 2021-04-17 NOTE — Care Management Important Message (Signed)
Important Message  Patient Details  Name: [name] MRN: [id number] Date of Birth: [birth year]   Medicare Important Message Given:  Yes     [name] 04/17/2021, 11:54 AM

## 2021-04-17 NOTE — Progress Notes (Signed)
Speech Language Pathology Treatment: Dysphagia (education)  Patient Details Name: [name] MRN: [id number] DOB: 1926-01-06 Today's Date: 04/17/2021 Time: 3838-1840 SLP Time Calculation (min) (ACUTE ONLY): 23 min  Assessment / Plan / Recommendation Clinical Impression  Met w/ pt/Family in room per MD request. Per report, pt appears to have declined in overall status since yesterday. Pt is no longer getting multiple pain meds per Son. Repeat Head CT this PM: "Known recent left basal ganglia region infarcts. 2. No evidence of new intracranial abnormality. 3. Mild-to-moderate chronic small vessel ischemic disease.  Labs: BUN/Creatinine are elevated; WBC normal at 7.8. Pt remains on 2L [place] O2 support. Pt has decreased breath sounds Bilaterally, No wheezing, rhonchi or rales per MD note. CXR yesterday PM: "No active disease.".  Discussed w/ Family present pt's presentation, current/increased risk for aspiration d/t drowsiness/weakness, and aspiration precautions that have been recommended. Family stated pt had wanted a sip of water just before this SLP arrived. They wanted her to have, so they gave it. Family denied any coughing or discomfort or change in her breathing to overtly indicated poor swallowing. Family stated they wanted to focus on pt's wishes for something like a sip of water/drink, so discussed a liquids-only diet at this time (no foods requiring more effort of chewing). Discussed w/ the Family pt's risk for aspiration as well as need for following aspiration precautions, same as has been discussed w/ the Son since evaluation. The Family agreed w/ monitoring a single sip at a time and Stopping if any difficulty swallowing. Also encouraged single ice chips or an ice popcicle. Precautions posted in room. NSG and MD updated. ST services can follow to monitor pt's status.     HPI HPI: Pt 88yo female w/ a past medical of atrial fibrillation not on anticoagulation due to fall risk, PAD s/p RLE  amputation, hypothyroidism, type II diabetes and hypertension, Afib, CHF, HLD; also c/o s/s of Reflux who presented to the ED this afternoon with acute onset of aphasia that initially improved but then around 1330 began again and was brought to the ED and a code stroke was initiated.   CT CODE Stroke with no hemorrhage or LVO. Patient does have a history of prior SDH following a fall years back. After discussion between neurology team and family, decision was made to move forward with TNK administration.  MRI and Head CT Post TNK: Patchy multifocal acute ischemic infarcts involving the left  basal ganglia and posterior left parieto-occipital region as above.  No associated hemorrhage or mass effect;  Recent infarction of the left  basal ganglia is more conspicuous on this examination.  Cerebral atrophy and chronic microvascular ischemic changes of  the white matter.  Postsurgical changes for right frontal/parietal craniectomy.      SLP Plan  Continue with current plan of care      Recommendations for follow up therapy are one component of a multi-disciplinary discharge planning process, led by the attending physician.  Recommendations may be updated based on patient status, additional functional criteria and insurance authorization.    Recommendations  Diet recommendations: Thin liquid (clear liquid diet) Liquids provided via: Teaspoon;Cup Medication Administration: Crushed with puree (IF appropriate to take per NSG assessment) Supervision: Staff to assist with self feeding;Full supervision/cueing for compensatory strategies Compensations: Minimize environmental distractions;Slow rate;Small sips/bites;Lingual sweep for clearance of pocketing Postural Changes and/or Swallow Maneuvers: Seated upright 90 degrees;Upright 30-60 min after meal (head forward)  General recommendations:  (Palliative Care consult if helpful for Family/pt) Oral Care Recommendations: Oral care BID;Oral  care before and after PO;Staff/trained caregiver to provide oral care ([employer] care) Follow Up Recommendations:  (TBD) Assistance recommended at discharge: Frequent or constant Supervision/Assistance SLP Visit Diagnosis: Dysphagia, unspecified (R13.10) Plan: Continue with current plan of care             Orinda Kenner, MS, [place] Pathologist Rehab Services [phone] [facility]  04/17/2021, 3:30 PM

## 2021-04-17 NOTE — TOC Progression Note (Signed)
Transition of Care [facility]) - Progression Note    Patient Details  Name: [name] MRN: [id number] Date of Birth: 1925/07/10  Transition of Care [facility]) CM/SW [place], RN Phone Number: 04/17/2021, 3:11 PM  Clinical Narrative:   Patient's condition has changed overnight, less responsive.  Head CT pending.  Patient's family would like to wait until MD discusses results of head CT to determine moving forward with bed search.  TOC to follow to discharge.    Expected Discharge Plan:  (TBD) Barriers to Discharge: Continued Medical Work up  Expected Discharge Plan and Services Expected Discharge Plan:  (TBD)       Living arrangements for the past 2 months: Single Family Home                                       Social Determinants of Health (SDOH) Interventions    Readmission Risk Interventions No flowsheet data found.

## 2021-04-17 NOTE — Progress Notes (Signed)
PT Cancellation Note  Patient Details Name: [name] MRN: [id number] DOB: 11/01/1925   Cancelled Treatment:     PT attempt.SLP in room. Will return later and continue to follow.    Willette Pa 04/17/2021, 3:08 PM

## 2021-04-17 NOTE — Progress Notes (Signed)
Patient ID: [name], female   DOB: August 03, 1925, 88 y.o.   MRN: [id number] [facility] PROGRESS NOTE  Sheridan Hew Timber[place] OVZ:[id number] DOB: 21-Mar-1926 DOA: 04/14/2021 PCP: Sofie Hartigan, MD  HPI/Subjective: Patient seen this morning and son was at the bedside.  Patient awakened from sleep.  Had some difficulty waking up and talking with me.  She was able to squeeze my hands and lift her left leg up off the bed but mental status more impaired today than yesterday.  Initially admitted with aphasia and stroke and given TNKase.  Objective: Vitals:   04/17/21 0446 04/17/21 0846  BP: (!) 171/55 (!) 157/76  Pulse: (!) 47 (!) 52  Resp: 20 (!) 22  Temp: 98.2 F (36.8 C) 98.6 F (37 C)  SpO2: 93% 91%    Intake/Output Summary (Last 24 hours) at 04/17/2021 1312 Last data filed at 04/17/2021 0441 Gross per 24 hour  Intake 816.13 ml  Output 400 ml  Net 416.13 ml   Filed Weights   04/14/21 1604 04/17/21 0439  Weight: 62.1 kg 57.7 kg    ROS: Review of Systems  Unable to perform ROS: Acuity of condition  Exam: Physical Exam HENT:     Head: Normocephalic.     Mouth/Throat:     Pharynx: No oropharyngeal exudate.  Eyes:     General: Lids are normal.     Conjunctiva/sclera: Conjunctivae normal.     Comments: Tried to get the patient to move her eyes but she did not do that for me.  Cardiovascular:     Rate and Rhythm: Normal rate and regular rhythm.     Heart sounds: Normal heart sounds, S1 normal and S2 normal.  Pulmonary:     Breath sounds: Examination of the right-lower field reveals decreased breath sounds. Examination of the left-lower field reveals decreased breath sounds. Decreased breath sounds present. No wheezing, rhonchi or rales.  Abdominal:     Palpations: Abdomen is soft.     Tenderness: There is no abdominal tenderness.  Musculoskeletal:     Right upper leg: No swelling.     Left lower leg: No swelling.  Skin:    General: Skin is warm.     Findings: No rash.   Neurological:     Mental Status: She is lethargic.     Comments: Able to squeeze my hands and lift up her left leg.      Scheduled Meds:  amLODipine  10 mg Oral Daily   atorvastatin  40 mg Oral Daily   cholecalciferol  1,000 Units Oral Daily   levothyroxine  88 mcg Oral QAC breakfast   lidocaine  1 patch Transdermal Q24H   mirtazapine  15 mg Oral QHS   multivitamin-lutein  1 capsule Oral QPM   sodium chloride flush  3 mL Intravenous Once   Continuous Infusions:  sodium chloride     sodium chloride      Assessment/Plan:  Acute metabolic encephalopathy.  Case discussed with neurology.  Will obtain an ABG and CT scan of the head.  We will get swallow evaluation.  Hold gabapentin and Lyrica and Remeron. Embolic stroke with persistent atrial fibrillation.  The patient presented with aphasia.  The patient was on aspirin and Plavix at home.  The patient was given TNKase and initially improved.  Patient currently on aspirin.  Neurology recommends starting Eliquis in about 2 weeks. Accelerated hypertension.  Got rid of Coreg with bradycardia.  Continue Norvasc if able to take.  Was initially on  clevidipine drip. Acute kidney injury on chronic disease stage IIIb.  Baseline creatinine around 1.33.  Creatinine rose up to 1.92 yesterday and up at 2.06 today.  We will give another fluid bolus today and continue fluids. Shortness of breath yesterday.  Chest x-ray negative. Hyperlipidemia unspecified on atorvastatin Hypothyroidism unspecified on levothyroxine Phantom pain right lower extremity holding gabapentin and Lyrica today with altered mental status.  Continue Tylenol as needed.  Patient has Lidoderm patch.        Code Status:     Code Status Orders  (From admission, onward)           Start     Ordered   04/14/21 1748  Do not attempt resuscitation (DNR)  Continuous       Question Answer Comment  In the event of cardiac or respiratory ARREST Do not call a code blue   In  the event of cardiac or respiratory ARREST Do not perform Intubation, CPR, defibrillation or ACLS   In the event of cardiac or respiratory ARREST Use medication by any route, position, wound care, and other measures to relive pain and suffering. May use oxygen, suction and manual treatment of airway obstruction as needed for comfort.      04/14/21 1748           Code Status History     Date Active Date Inactive Code Status Order ID Comments User Context   10/22/2019 0837 10/25/2019 1757 DNR [id number]  Sela Hua, PA-C Inpatient   10/03/2019 1720 10/22/2019 0837 Full Code [id number]  Leonides Schanz Inpatient   07/31/2019 1230 08/01/2019 2228 Full Code [id number]  Leonides Schanz Inpatient   03/01/2019 1604 03/02/2019 2136 Full Code [id number]  Katha Cabal, MD Inpatient   06/13/2018 1331 06/13/2018 1936 Full Code [id number]  Katha Cabal, MD Inpatient   08/12/2017 1158 08/12/2017 1700 Full Code [id number]  Schnier, Dolores Lory, MD Inpatient      Family Communication: Spoke with son at the bedside Disposition Plan: Status is: Inpatient.  With worsening status this will entail a longer hospital course than initially expected.  [place]  [facility] [employer]

## 2021-04-17 NOTE — Progress Notes (Signed)
PT Cancellation Note  Patient Details Name: [name] MRN: [id number] DOB: 1925-10-25   Cancelled Treatment:     Pt was laying in bed with eyes open however unresponsive to voice. Supportive family member at bedside. " She is not doing well today at all. She was much better than this yesterday." CT has been ordered. PT will await results prior to returning and treating.    Willette Pa 04/17/2021, 1:12 PM

## 2021-04-18 DIAGNOSIS — I639 Cerebral infarction, unspecified: Secondary | ICD-10-CM | POA: Diagnosis not present

## 2021-04-18 DIAGNOSIS — I63432 Cerebral infarction due to embolism of left posterior cerebral artery: Secondary | ICD-10-CM | POA: Diagnosis not present

## 2021-04-18 DIAGNOSIS — N179 Acute kidney failure, unspecified: Secondary | ICD-10-CM | POA: Diagnosis not present

## 2021-04-18 DIAGNOSIS — G4734 Idiopathic sleep related nonobstructive alveolar hypoventilation: Secondary | ICD-10-CM

## 2021-04-18 DIAGNOSIS — I10 Essential (primary) hypertension: Secondary | ICD-10-CM | POA: Diagnosis not present

## 2021-04-18 DIAGNOSIS — G9341 Metabolic encephalopathy: Secondary | ICD-10-CM | POA: Diagnosis not present

## 2021-04-18 LAB — URINALYSIS, COMPLETE (UACMP) WITH MICROSCOPIC
Bacteria, UA: NONE SEEN
Bilirubin Urine: NEGATIVE
Glucose, UA: 50 mg/dL — AB
Hgb urine dipstick: NEGATIVE
Ketones, ur: NEGATIVE mg/dL
Nitrite: NEGATIVE
Protein, ur: 100 mg/dL — AB
Specific Gravity, Urine: 1.015 (ref 1.005–1.030)
pH: 5 (ref 5.0–8.0)

## 2021-04-18 LAB — CBC
HCT: 38.8 % (ref 36.0–46.0)
Hemoglobin: 13.1 g/dL (ref 12.0–15.0)
MCH: 32 pg (ref 26.0–34.0)
MCHC: 33.8 g/dL (ref 30.0–36.0)
MCV: 94.6 fL (ref 80.0–100.0)
Platelets: 176 10*3/uL (ref 150–400)
RBC: 4.1 MIL/uL (ref 3.87–5.11)
RDW: 12.2 % (ref 11.5–15.5)
WBC: 7.5 10*3/uL (ref 4.0–10.5)
nRBC: 0 % (ref 0.0–0.2)

## 2021-04-18 LAB — BASIC METABOLIC PANEL
Anion gap: 8 (ref 5–15)
BUN: 34 mg/dL — ABNORMAL HIGH (ref 8–23)
CO2: 24 mmol/L (ref 22–32)
Calcium: 8.4 mg/dL — ABNORMAL LOW (ref 8.9–10.3)
Chloride: 111 mmol/L (ref 98–111)
Creatinine, Ser: 1.94 mg/dL — ABNORMAL HIGH (ref 0.44–1.00)
GFR, Estimated: 24 mL/min — ABNORMAL LOW (ref >60)
Glucose, Bld: 108 mg/dL — ABNORMAL HIGH (ref 70–99)
Potassium: 3.9 mmol/L (ref 3.5–5.1)
Sodium: 143 mmol/L (ref 135–145)

## 2021-04-18 MED ORDER — MIRTAZAPINE 15 MG PO TBDP
15.0000 mg | ORAL_TABLET | Freq: Once | ORAL | Status: AC
  Administered 2021-04-18: 15 mg via ORAL
  Filled 2021-04-18: qty 1

## 2021-04-18 MED ORDER — MORPHINE SULFATE (PF) 2 MG/ML IV SOLN
1.0000 mg | Freq: Once | INTRAVENOUS | Status: AC
  Administered 2021-04-18: 20:00:00 1 mg via INTRAVENOUS
  Filled 2021-04-18: qty 1

## 2021-04-18 MED ORDER — PNEUMOCOCCAL VAC POLYVALENT 25 MCG/0.5ML IJ INJ
0.5000 mL | INJECTION | INTRAMUSCULAR | Status: AC
  Administered 2021-04-20: 0.5 mL via INTRAMUSCULAR
  Filled 2021-04-18: qty 0.5

## 2021-04-18 NOTE — Progress Notes (Signed)
Physical Therapy Treatment Patient Details Name: [name] MRN: [id number] DOB: [birth year] Today's Date: 04/18/2021   History of Present Illness Pt is admitted for CVA, positive through L BG and occipital CVA. Pt is s/p TNK on 1/3. History includes R frontal/temporal craniotomy, Afib, R LE AKA, DM, and HTN.    PT Comments    Pt was long sitting in bed upon arriving with supportive son and daughter at bedside. She is much more alert today versus previous date however still remains lethargic and decondition. Was agreeable to session and cooperative throughout. Pt is severely weak and decondition. Non ambulatory at baseline however was able to get to/from w/c at home prior to admission. Lengthy discussion with family about plan going forward. All agreeable to SNF at DC. She was able to sit up on R side of bed with mod assist and a lot of increased time. Stood EOB 1 x to allow RN to change wet pad prior to being repositioned in bed. Pt overall tolerated session well. Will continue to follow and progress as able per current POC.    Recommendations for follow up therapy are one component of a multi-disciplinary discharge planning process, led by the attending physician.  Recommendations may be updated based on patient status, additional functional criteria and insurance authorization.  Follow Up Recommendations  Skilled nursing-short term rehab (<3 hours/day)     Assistance Recommended at Discharge Frequent or constant Supervision/Assistance  Patient can return home with the following A lot of help with walking and/or transfers   Equipment Recommendations  Rolling walker (2 wheels)       Precautions / Restrictions Precautions Precautions: Fall Restrictions Weight Bearing Restrictions: No Other Position/Activity Restrictions: R LE AKA chronic     Mobility  Bed Mobility Overal bed mobility: Needs Assistance Bed Mobility: Supine to Sit;Sit to Supine Rolling: Mod assist   Supine to sit:  Mod assist     General bed mobility comments: increased time, trunk support, cues for sequence    Transfers Overall transfer level: Needs assistance Equipment used: None (BUE supported by therapist with author blocking L knee from buckling.) Transfers: Sit to/from Stand Sit to Stand: Max assist;From elevated surface           General transfer comment: Max assist to stand one time from elevated bed height. stood long enough for RN to change wet draw pads.    Ambulation/Gait    General Gait Details: non ambulatory at baseline per son/daughter in law    Balance Overall balance assessment: Needs assistance Sitting-balance support: Feet supported;Bilateral upper extremity supported Sitting balance-[name] Scale: Poor Sitting balance - Comments: R lateral lean        Cognition Arousal/Alertness: Lethargic;Awake/alert Behavior During Therapy: Flat affect Overall Cognitive Status: History of cognitive impairments - at baseline        General Comments: Pt is much more alert than previous date. Was able to follow simple commands with increased time. Remained lethargic and drowsy however was able to appropriately answer questions and respond with increased time        Exercises Other Exercises Other Exercises: OT ed with pt and family re: d/c planning and concerns with waxing/waning alertness/ability to participate        Pertinent Vitals/Pain Pain Assessment: No/denies pain Faces Pain Scale: No hurt Pain Location: doesn't specify today, could be continued phantom limb pains as pt grimmaces with mobilization of RLE Pain Descriptors / Indicators: Grimacing;Guarding Pain Intervention(s): Limited activity within patient's tolerance;Monitored during session;Repositioned  PT Goals (current goals can now be found in the care plan section) Acute Rehab PT Goals Patient Stated Goal: none stated Progress towards PT goals: Progressing toward goals    Frequency     7X/week      PT Plan Current plan remains appropriate       AM-PAC PT "6 Clicks" Mobility   Outcome Measure  Help needed turning from your back to your side while in a flat bed without using bedrails?: A Lot Help needed moving from lying on your back to sitting on the side of a flat bed without using bedrails?: A Lot Help needed moving to and from a bed to a chair (including a wheelchair)?: A Lot Help needed standing up from a chair using your arms (e.g., wheelchair or bedside chair)?: A Lot Help needed to walk in hospital room?: Total Help needed climbing 3-5 steps with a railing? : Total 6 Click Score: 10    End of Session   Activity Tolerance: Patient tolerated treatment well Patient left: in bed;with chair alarm set;with family/visitor present Nurse Communication: Mobility status PT Visit Diagnosis: Unsteadiness on feet (R26.81);Muscle weakness (generalized) (M62.81)     Time: 1340-1403 PT Time Calculation (min) (ACUTE ONLY): 23 min  Charges:  $Therapeutic Activity: 23-37 mins                     Julaine Fusi PTA 04/18/21, 2:42 PM

## 2021-04-18 NOTE — Plan of Care (Signed)
  Problem: Education: Goal: Knowledge of General Education information will improve Description: Including pain rating scale, medication(s)/side effects and non-pharmacologic comfort measures Outcome: Progressing   Problem: Health Behavior/Discharge Planning: Goal: Ability to manage health-related needs will improve Outcome: Progressing   Problem: Clinical Measurements: Goal: Ability to maintain clinical measurements within normal limits will improve Outcome: Progressing Goal: Will remain free from infection Outcome: Progressing Goal: Diagnostic test results will improve Outcome: Progressing Goal: Respiratory complications will improve Outcome: Progressing Goal: Cardiovascular complication will be avoided Outcome: Progressing   Problem: Activity: Goal: Risk for activity intolerance will decrease Outcome: Progressing   Problem: Nutrition: Goal: Adequate nutrition will be maintained Outcome: Progressing   Problem: Coping: Goal: Level of anxiety will decrease Outcome: Progressing   Problem: Elimination: Goal: Will not experience complications related to bowel motility Outcome: Progressing Goal: Will not experience complications related to urinary retention Outcome: Progressing   Problem: Pain Managment: Goal: General experience of comfort will improve Outcome: Progressing   Problem: Safety: Goal: Ability to remain free from injury will improve Outcome: Progressing   Problem: Skin Integrity: Goal: Risk for impaired skin integrity will decrease Outcome: Progressing   Problem: Education: Goal: Knowledge of disease or condition will improve Outcome: Progressing Goal: Knowledge of secondary prevention will improve (SELECT ALL) Outcome: Progressing Goal: Knowledge of patient specific risk factors will improve (INDIVIDUALIZE FOR PATIENT) Outcome: Progressing Goal: Individualized Educational Video(s) Outcome: Progressing   

## 2021-04-18 NOTE — Progress Notes (Signed)
Patient ID: [name], female   DOB: August 05, 1925, 88 y.o.   MRN: [id number] [facility] PROGRESS NOTE  Lorine Iannaccone Reasor KXF:[id number] DOB: 1925-09-27 DOA: 04/14/2021 PCP: Sofie Hartigan, MD  HPI/Subjective: Patient more awake and alert today than yesterday.  Able to talk with me and answer some questions and follow commands.  No shortness of breath.  Feeling better.  Objective: Vitals:   04/18/21 0512 04/18/21 0925  BP: (!) 179/76 (!) 165/52  Pulse: 60 (!) 54  Resp: 20 20  Temp: 98.1 F (36.7 C) 98.7 F (37.1 C)  SpO2: 92% 95%    Intake/Output Summary (Last 24 hours) at 04/18/2021 1521 Last data filed at 04/18/2021 0824 Gross per 24 hour  Intake 751.99 ml  Output 300 ml  Net 451.99 ml   Filed Weights   04/14/21 1604 04/17/21 0439 04/18/21 0500  Weight: 62.1 kg 57.7 kg 57.6 kg    ROS: Review of Systems  Respiratory:  Negative for shortness of breath.   Cardiovascular:  Negative for chest pain.  Gastrointestinal:  Negative for abdominal pain, nausea and vomiting.  Exam: Physical Exam HENT:     Head: Normocephalic.     Mouth/Throat:     Pharynx: No oropharyngeal exudate.  Eyes:     General: Lids are normal.     Conjunctiva/sclera: Conjunctivae normal.  Cardiovascular:     Rate and Rhythm: Normal rate and regular rhythm.     Heart sounds: Normal heart sounds, S1 normal and S2 normal.  Pulmonary:     Breath sounds: No decreased breath sounds, wheezing, rhonchi or rales.  Abdominal:     Palpations: Abdomen is soft.     Tenderness: There is no abdominal tenderness.  Musculoskeletal:     Right upper leg: No swelling.     Left lower leg: No swelling.  Skin:    General: Skin is warm.     Findings: No rash.  Neurological:     Mental Status: She is alert.     Comments: Answers questions appropriately.  Able to straight leg raise with the left leg.      Scheduled Meds:  amLODipine  10 mg Oral Daily   aspirin  81 mg Oral Daily   atorvastatin  40 mg Oral Daily    cholecalciferol  1,000 Units Oral Daily   levothyroxine  88 mcg Oral QAC breakfast   lidocaine  1 patch Transdermal Q24H   multivitamin-lutein  1 capsule Oral QPM   sodium chloride flush  3 mL Intravenous Once   Continuous Infusions:  sodium chloride 40 mL/hr at 04/18/21 6967    Assessment/Plan:  Acute metabolic encephalopathy.  Improved today as compared to yesterday.  Repeat CT scan did not show any acute event.  Continue to hold gabapentin Lyrica and Remeron.  Hypoxia could be contributing.  Overnight oximetry did show that she was hypoxic.  Qualifies for nocturnal oxygen. Embolic stroke with persistent atrial fibrillation.  Patient presented with aphasia.  Patient was on aspirin and Plavix at home.  The patient received TNKase and initially improved.  Continue aspirin.  Neurology recommends Eliquis in about 2 weeks.  Patient on atorvastatin.  LDL 110 Accelerated hypertension.  Continue Norvasc Acute kidney injury on chronic kidney disease stage IIIb.  Creatinine peaked at 2.06 yesterday.  Down to 1.92 today.  Continue gentle IV fluids. Hyperlipidemia unspecified on atorvastatin Hypothyroidism unspecified on levothyroxine Phantom limb pain right lower extremity.  Hold gabapentin and Lyrica secondary to altered mental status yesterday. Nocturnal hypoxia  will qualify for nocturnal oxygen.      Code Status:     Code Status Orders  (From admission, onward)           Start     Ordered   04/14/21 1748  Do not attempt resuscitation (DNR)  Continuous       Question Answer Comment  In the event of cardiac or respiratory ARREST Do not call a code blue   In the event of cardiac or respiratory ARREST Do not perform Intubation, CPR, defibrillation or ACLS   In the event of cardiac or respiratory ARREST Use medication by any route, position, wound care, and other measures to relive pain and suffering. May use oxygen, suction and manual treatment of airway obstruction as needed for  comfort.      04/14/21 1748           Code Status History     Date Active Date Inactive Code Status Order ID Comments User Context   10/22/2019 0837 10/25/2019 1757 DNR [id number]  Sela Hua, PA-C Inpatient   10/03/2019 1720 10/22/2019 0837 Full Code [id number]  Leonides Schanz Inpatient   07/31/2019 1230 08/01/2019 2228 Full Code [id number]  Leonides Schanz Inpatient   03/01/2019 1604 03/02/2019 2136 Full Code [id number]  Katha Cabal, MD Inpatient   06/13/2018 1331 06/13/2018 1936 Full Code [id number]  Katha Cabal, MD Inpatient   08/12/2017 1158 08/12/2017 1700 Full Code [id number]  Schnier, Dolores Lory, MD Inpatient      Family Communication: Spoke with patient's daughter at the bedside Disposition Plan: Status is: Inpatient  Jamorris Ndiaye [employer]

## 2021-04-18 NOTE — Progress Notes (Signed)
SLP F/u Note  Patient Details Name: [name] MRN: [id number] DOB: 01-08-1926   Cancelled treatment:       Reason Eval/Treat Not Completed:  (chart reviewed; consulted MD re: pt's status). MD stated pt is feeling better today w/ improved engagement. Recommend continue current diet w/ support and monitoring at meals. General aspiration precautions. ST services can be available for anything further while admitted. MD agreed.     Orinda Kenner, MS, CCC-SLP Speech Language Pathologist Rehab Services [phone] [facility] 04/18/2021, 4:23 PM

## 2021-04-18 NOTE — Progress Notes (Signed)
Subjective: She had some worsening yesterday, but better again today.   Exam: Vitals:   04/18/21 0512 04/18/21 0925  BP: (!) 179/76 (!) 165/52  Pulse: 60 (!) 54  Resp: 20 20  Temp: 98.1 F (36.7 C) 98.7 F (37.1 C)  SpO2: 92% 95%   Gen: In bed, NAD Resp: non-labored breathing, no acute distress Abd: soft, nt  Neuro: MS: Awake, alert, speech is slow, with few words but improved, she knows the month and year, she is able to name simple objects, some difficulty with repetition.  CN: Visual fields are full, EOMI without gaze preference Motor: She has no drift in either arm or her left leg, right AKA Sensory: Intact to light touch   Pertinent Labs: LDL is 110 A1c 7.7  Impression: 88 year old female with an embolic stroke in the setting of atrial fibrillation without anticoagulation.  Speaking with her son, she apparently was not much of a fall risk, given that she is wheelchair-bound.  I would favor holding anticoagulation for 2 weeks, but as long as she continues to not be a fall risk, I would favor starting one of the novel anticoagulants as a secondary preventative.    She is on dual antiplatelet therapy at home.  If she is started on anticoagulation, and needs antiplatelet therapy for something other than stroke prevention, I favor combining with only single antiplatelet if feasible.  I suspect her worsening was due to sedating medications, or possibly hypoxemia. She is improved today.   Recommendations: 1) aspirin 81 mg daily, then start Eliquis after 2 weeks. 2) atorvastatin 40 mg daily 3) PT, OT, ST 5) DM control with goal LDL less than 7 6) agree with holding sedating medications.  7) Neurology will be available on an as needed basis.   Roland Rack, MD [facility] [phone]  If 7pm- 7am, please page neurology on call as listed in [place].

## 2021-04-18 NOTE — Progress Notes (Signed)
Occupational Therapy Treatment Patient Details Name: [name] MRN: [id number] DOB: Apr 12, 1926 Today's Date: 04/18/2021   History of present illness Pt is admitted for CVA, positive through L BG and occipital CVA. Pt is s/p TNK on 1/3. History includes R frontal/temporal craniotomy, Afib, R LE AKA, DM, and HTN.   OT comments  Pt seen for OT Tx this date. OT engages pt in sup to sit with MOD A and pt demos F to P static sitting balance (waxes and wanes with fatigue). She is more able to engage and communicate this date, but only oriented to self and continues to only make minimal verbalizations with therapist when being directly spoken to and cued. She requires moderate multimodal cueing for sequencing/initiating tasks throughout. OT engages her in seated oral care with MIN A to sustain balance and SETUP for acutal ADL task. OT attempts to engage pt in STS with minimal ability to clear bottom from bed despite knee/foot blocking, use of RW, bed slightly elevated and TOTAL A from therapist. She is able to complete 3 small lateral scoots with MAX A to get closer to [facility]. She requires MOD A to get back into bed and MAX A for repositioning. Pt is left with all needs met and in reach. OT spend extended time talking to pt and family member (daughter) regarding her waxing and waning capabilities with therapy and her rehabilitation potential as well as likely need for f/u STR services with her rehab potential being guarded at this time. OT will continue to follow acutely. Continue to anticipate that STR is most appropriate d/c recommendation. Decreased frequency to 3x/wk d/t waxing/waning tolerance based on chart review from yesterday as well as performance on 2 OT treatments.    Recommendations for follow up therapy are one component of a multi-disciplinary discharge planning process, led by the attending physician.  Recommendations may be updated based on patient status, additional functional criteria and  insurance authorization.    Follow Up Recommendations  Skilled nursing-short term rehab (<3 hours/day)    Assistance Recommended at Discharge Frequent or constant Supervision/Assistance  Patient can return home with the following  Two people to help with walking and/or transfers;Two people to help with bathing/dressing/bathroom;Assistance with cooking/housework;Assistance with feeding;Direct supervision/assist for medications management;Direct supervision/assist for financial management;Assist for transportation;Help with stairs or ramp for entrance   Equipment Recommendations  Hospital bed    Recommendations for Other Services      Precautions / Restrictions Precautions Precautions: Fall Restrictions Weight Bearing Restrictions: No Other Position/Activity Restrictions: R LE AKA chronic       Mobility Bed Mobility Overal bed mobility: Needs Assistance Bed Mobility: Supine to Sit;Sit to Supine Rolling: Mod assist   Supine to sit: Mod assist     General bed mobility comments: increased time, trunk support, cues for sequence    Transfers                   General transfer comment: attempted with RW and L LE knee/foot blocking-to advance pt to partial stand in prep for SPS transfer trial, but pt unable to clear more than 3-4" with TOTAL A     Balance Overall balance assessment: Needs assistance Sitting-balance support: Feet supported;Bilateral upper extremity supported Sitting balance-[name] Scale: Poor                                     ADL either performed or assessed with  clinical judgement   ADL Overall ADL's : Needs assistance/impaired     Grooming: Oral care;Set up;Cueing for sequencing;Sitting Grooming Details (indicate cue type and reason): EOB with MIN A To sustain balance             Lower Body Dressing: Total assistance Lower Body Dressing Details (indicate cue type and reason): to don sock to L LE                     Extremity/Trunk Assessment              Vision       Perception     Praxis      Cognition Arousal/Alertness: Lethargic;Awake/alert Behavior During Therapy: WFL for tasks assessed/performed Overall Cognitive Status: Within Functional Limits for tasks assessed                                 General Comments: initially very lethargic, does perk up some, requires cues for visual attention/scanning, only oriented to self. Remains drwsy throughout session. Slurred speech.          Exercises Other Exercises Other Exercises: OT ed with pt and family re: d/c planning and concerns with waxing/waning alertness/ability to participate   Shoulder Instructions       General Comments      Pertinent Vitals/ Pain       Pain Assessment: Faces Faces Pain Scale: Hurts little more Pain Location: doesn't specify today, could be continued phantom limb pains as pt grimmaces with mobilization of RLE Pain Descriptors / Indicators: Grimacing;Guarding Pain Intervention(s): Limited activity within patient's tolerance;Monitored during session  Home Living                                          Prior Functioning/Environment              Frequency  Min 3X/week        Progress Toward Goals  OT Goals(current goals can now be found in the care plan section)  Progress towards OT goals: OT to reassess next treatment  Acute Rehab OT Goals Patient Stated Goal: to be more consistently able to engage OT Goal Formulation: With family Time For Goal Achievement: 04/30/21 Potential to Achieve Goals: [place] Discharge plan remains appropriate;Frequency needs to be updated    Co-evaluation                 AM-PAC OT "6 Clicks" Daily Activity     Outcome Measure   Help from another person eating meals?: A Lot Help from another person taking care of personal grooming?: A Lot Help from another person toileting, which includes using toliet,  bedpan, or urinal?: A Lot Help from another person bathing (including washing, rinsing, drying)?: A Lot Help from another person to put on and taking off regular upper body clothing?: A Lot Help from another person to put on and taking off regular lower body clothing?: A Lot 6 Click Score: 12    End of Session Equipment Utilized During Treatment: Gait belt;Rolling walker (2 wheels) (for attempted/unsuccuessful partial stand)  OT Visit Diagnosis: Unsteadiness on feet (R26.81);Muscle weakness (generalized) (M62.81)   Activity Tolerance Patient tolerated treatment well;Patient limited by fatigue;Patient limited by lethargy   Patient Left in bed;with call bell/phone within reach;with bed alarm set;with nursing/sitter  in room;with family/visitor present   Nurse Communication          Time: [phone] OT Time Calculation (min): 42 min  Charges: OT General Charges $OT Visit: 1 Visit OT Treatments $Self Care/Home Management : 23-37 mins $Therapeutic Activity: 8-22 mins  Gerrianne Scale, Jansen, OTR/L ascom [phone] 04/18/21, 2:17 PM

## 2021-04-19 DIAGNOSIS — G9341 Metabolic encephalopathy: Secondary | ICD-10-CM | POA: Diagnosis not present

## 2021-04-19 DIAGNOSIS — I63432 Cerebral infarction due to embolism of left posterior cerebral artery: Secondary | ICD-10-CM | POA: Diagnosis not present

## 2021-04-19 DIAGNOSIS — N179 Acute kidney failure, unspecified: Secondary | ICD-10-CM | POA: Diagnosis not present

## 2021-04-19 DIAGNOSIS — I10 Essential (primary) hypertension: Secondary | ICD-10-CM | POA: Diagnosis not present

## 2021-04-19 LAB — BASIC METABOLIC PANEL WITH GFR
Anion gap: 7 (ref 5–15)
BUN: 30 mg/dL — ABNORMAL HIGH (ref 8–23)
CO2: 25 mmol/L (ref 22–32)
Calcium: 8.5 mg/dL — ABNORMAL LOW (ref 8.9–10.3)
Chloride: 110 mmol/L (ref 98–111)
Creatinine, Ser: 1.52 mg/dL — ABNORMAL HIGH (ref 0.44–1.00)
GFR, Estimated: 32 mL/min — ABNORMAL LOW
Glucose, Bld: 154 mg/dL — ABNORMAL HIGH (ref 70–99)
Potassium: 3.7 mmol/L (ref 3.5–5.1)
Sodium: 142 mmol/L (ref 135–145)

## 2021-04-19 MED ORDER — HALOPERIDOL LACTATE 5 MG/ML IJ SOLN: 1.0000 mg | Freq: Four times a day (QID) | INTRAMUSCULAR | Status: DC | PRN

## 2021-04-19 MED ORDER — MIRTAZAPINE 15 MG PO TABS
7.5000 mg | ORAL_TABLET | Freq: Every day | ORAL | Status: DC
  Administered 2021-04-19 – 2021-04-20 (×2): 7.5 mg via ORAL
  Filled 2021-04-19 (×2): qty 1

## 2021-04-19 MED ORDER — POLYETHYLENE GLYCOL 3350 17 G PO PACK
17.0000 g | PACK | Freq: Two times a day (BID) | ORAL | Status: DC
  Administered 2021-04-19 – 2021-04-24 (×10): 17 g via ORAL
  Filled 2021-04-19 (×12): qty 1

## 2021-04-19 MED ORDER — HYDRALAZINE HCL 10 MG PO TABS
10.0000 mg | ORAL_TABLET | Freq: Three times a day (TID) | ORAL | Status: DC
  Administered 2021-04-19: 19:00:00 10 mg via ORAL
  Filled 2021-04-19 (×2): qty 1

## 2021-04-19 NOTE — Progress Notes (Signed)
Physical Therapy Treatment Patient Details Name: [name] MRN: [id number] DOB: [birth year] Today's Date: 04/19/2021   History of Present Illness Pt is admitted for CVA, positive through L BG and occipital CVA. Pt is s/p TNK on 1/3. History includes R frontal/temporal craniotomy, Afib, R LE AKA, DM, and HTN.    PT Comments    Attempted to see pt earlier in day but pt just eating lunch. Returned later & pt asleep but awakened & agreeable to tx with daughter & son present & encouraging pt. Pt is able to complete supine>sit with mod assist with use of hospital bed features & tolerates sitting EOB ~12 minutes with supervision without LOB while also engaging in LLE strengthening exercises. Pt declines getting OOB to recliner but wishes to continue sitting EOB. PT instructed daughter on need to sit beside pt & pt left in care of family & nurse.     Recommendations for follow up therapy are one component of a multi-disciplinary discharge planning process, led by the attending physician.  Recommendations may be updated based on patient status, additional functional criteria and insurance authorization.  Follow Up Recommendations  Skilled nursing-short term rehab (<3 hours/day)     Assistance Recommended at Discharge Frequent or constant Supervision/Assistance  Patient can return home with the following A lot of help with walking and/or transfers;A lot of help with bathing/dressing/bathroom;Assistance with cooking/housework;Assist for transportation;Assistance with feeding;Help with stairs or ramp for entrance   Equipment Recommendations  BSC/3in1 (drop arm 3-in-1 BSC\)    Recommendations for Other Services       Precautions / Restrictions Precautions Precautions: Fall Restrictions Weight Bearing Restrictions: No Other Position/Activity Restrictions: old R AKA     Mobility  Bed Mobility Overal bed mobility: Needs Assistance Bed Mobility: Supine to Sit     Supine to sit: Mod  assist;HOB elevated     General bed mobility comments: assistance to move LLE to EOB & upright trunk, use of bed rails    Transfers                        Ambulation/Gait                   Stairs             Wheelchair Mobility    Modified Rankin (Stroke Patients Only)       Balance Overall balance assessment: Needs assistance Sitting-balance support: Feet supported (LUE support on bed rail fade to no UE support) Sitting balance-[name] Scale: Fair Sitting balance - Comments: supervision static sitting, no LOB noted                                    Cognition Arousal/Alertness: Awake/alert Behavior During Therapy: Flat affect Overall Cognitive Status: History of cognitive impairments - at baseline                                 General Comments: Pt received asleep but awakened & agreeable to tx. Recalled children's names and which child was coming to visit, which pleased her daughter.        Exercises General Exercises - Lower Extremity Long Arc Quad: AROM;Strengthening;Left;10 reps;Seated Hip Flexion/Marching: AROM;Strengthening;Left;10 reps;Seated    General Comments        Pertinent Vitals/Pain Pain Assessment: No/denies pain    Home  Living                          Prior Function            PT Goals (current goals can now be found in the care plan section) Acute Rehab PT Goals Patient Stated Goal: increase independence with mobility PT Goal Formulation: With family Time For Goal Achievement: 04/30/21 Potential to Achieve Goals: Good Progress towards PT goals: Progressing toward goals    Frequency    7X/week      PT Plan Current plan remains appropriate    Co-evaluation              AM-PAC PT "6 Clicks" Mobility   Outcome Measure  Help needed turning from your back to your side while in a flat bed without using bedrails?: A Little Help needed moving from lying on  your back to sitting on the side of a flat bed without using bedrails?: A Lot Help needed moving to and from a bed to a chair (including a wheelchair)?: A Lot Help needed standing up from a chair using your arms (e.g., wheelchair or bedside chair)?: A Lot Help needed to walk in hospital room?: Total Help needed climbing 3-5 steps with a railing? : Total 6 Click Score: 11    End of Session Equipment Utilized During Treatment: Oxygen Activity Tolerance: Patient tolerated treatment well Patient left:  (sitting EOB with nurse & family in room) Nurse Communication: Mobility status PT Visit Diagnosis: Unsteadiness on feet (R26.81);Muscle weakness (generalized) (M62.81)     Time: 5947-0761 PT Time Calculation (min) (ACUTE ONLY): 18 min  Charges:  $Therapeutic Activity: 8-22 mins                     Lavone Nian, PT, DPT 04/19/21, 2:55 PM    Waunita Schooner 04/19/2021, 2:54 PM

## 2021-04-19 NOTE — Plan of Care (Signed)
  Problem: Education: Goal: Knowledge of General Education information will improve Description: Including pain rating scale, medication(s)/side effects and non-pharmacologic comfort measures Outcome: Progressing   Problem: Health Behavior/Discharge Planning: Goal: Ability to manage health-related needs will improve Outcome: Progressing   Problem: Clinical Measurements: Goal: Ability to maintain clinical measurements within normal limits will improve Outcome: Progressing Goal: Will remain free from infection Outcome: Progressing Goal: Diagnostic test results will improve Outcome: Progressing Goal: Respiratory complications will improve Outcome: Progressing Goal: Cardiovascular complication will be avoided Outcome: Progressing   Problem: Activity: Goal: Risk for activity intolerance will decrease Outcome: Progressing   Problem: Nutrition: Goal: Adequate nutrition will be maintained Outcome: Progressing   Problem: Coping: Goal: Level of anxiety will decrease Outcome: Progressing   Problem: Elimination: Goal: Will not experience complications related to bowel motility Outcome: Progressing Goal: Will not experience complications related to urinary retention Outcome: Progressing   Problem: Pain Managment: Goal: General experience of comfort will improve Outcome: Progressing   Problem: Safety: Goal: Ability to remain free from injury will improve Outcome: Progressing   Problem: Skin Integrity: Goal: Risk for impaired skin integrity will decrease Outcome: Progressing   Problem: Education: Goal: Knowledge of disease or condition will improve Outcome: Progressing Goal: Knowledge of secondary prevention will improve (SELECT ALL) Outcome: Progressing Goal: Knowledge of patient specific risk factors will improve (INDIVIDUALIZE FOR PATIENT) Outcome: Progressing Goal: Individualized Educational Video(s) Outcome: Progressing   

## 2021-04-19 NOTE — Progress Notes (Signed)
Patient ID: [name], female   DOB: 08-17-1925, 88 y.o.   MRN: [id number] [facility] PROGRESS NOTE  [name] PNT:[id number] DOB: 02-21-1926 DOA: 04/14/2021 PCP: Sofie Hartigan, MD  HPI/Subjective: Patient seen this morning and she was sleeping without her oxygen.  I placed the oxygen and she woke up.  She answered a few questions but went back to sleep.  Patient's daughter at the bedside and stated that she had a good day yesterday until the afternoon when she started ripping things off and they ended up giving her morphine.  She has been sleeping since she came this morning.  As per nursing staff the patient was able to work with PT and sit up.  Objective: Vitals:   04/19/21 0737 04/19/21 1146  BP: (!) 141/48 (!) 155/56  Pulse: (!) 54 62  Resp: 18 (!) 24  Temp: 98.2 F (36.8 C) 97.9 F (36.6 C)  SpO2: 92% 95%    Intake/Output Summary (Last 24 hours) at 04/19/2021 1552 Last data filed at 04/19/2021 1048 Gross per 24 hour  Intake 1040.56 ml  Output 850 ml  Net 190.56 ml   Filed Weights   04/18/21 0500 04/18/21 1537 04/19/21 0500  Weight: 57.6 kg 57.6 kg 57.4 kg    ROS: Review of Systems  Respiratory:  Negative for shortness of breath.   Cardiovascular:  Negative for chest pain.  Gastrointestinal:  Negative for abdominal pain, nausea and vomiting.  Exam: Physical Exam HENT:     Head: Normocephalic.     Mouth/Throat:     Pharynx: No oropharyngeal exudate.  Eyes:     General: Lids are normal.     Conjunctiva/sclera: Conjunctivae normal.  Cardiovascular:     Rate and Rhythm: Normal rate and regular rhythm.     Heart sounds: Normal heart sounds, S1 normal and S2 normal.  Pulmonary:     Breath sounds: No decreased breath sounds, wheezing, rhonchi or rales.  Abdominal:     Palpations: Abdomen is soft.     Tenderness: There is no abdominal tenderness.  Musculoskeletal:     Left lower leg: No swelling.  Skin:    General: Skin is warm.     Findings: No rash.   Neurological:     Mental Status: She is lethargic.      Scheduled Meds:  amLODipine  10 mg Oral Daily   aspirin  81 mg Oral Daily   atorvastatin  40 mg Oral Daily   cholecalciferol  1,000 Units Oral Daily   levothyroxine  88 mcg Oral QAC breakfast   lidocaine  1 patch Transdermal Q24H   mirtazapine  7.5 mg Oral QHS   multivitamin-lutein  1 capsule Oral QPM   pneumococcal 23 valent vaccine  0.5 mL Intramuscular Tomorrow-1000   polyethylene glycol  17 g Oral BID   sodium chloride flush  3 mL Intravenous Once   Continuous Infusions:  sodium chloride 30 mL/hr at 04/19/21 0932    Assessment/Plan:  Acute metabolic encephalopathy.  Mental status impaired this morning.  Was better yesterday when I saw her.  Continue to monitor.  I replaced oxygen this morning when I saw her. Embolic stroke with persistent atrial fibrillation.  Patient presented with aphasia.  She was on aspirin and Plavix at home prior to coming in.  Patient received TNK and initially improved.  Continue aspirin.  Neurology recommends Eliquis in about 2 weeks.  Patient on atorvastatin and last LDL 110. Accelerated hypertension on coming in.  Patient currently  on Norvasc. Acute kidney injury on chronic kidney disease stage IIIb.  Creatinine peaked at 2.062 two  days ago and is down to 1.52 today. Hyperlipidemia unspecified on atorvastatin Hypothyroidism unspecified on levothyroxine Phantom limb pain.  Holding gabapentin and Lyrica secondary to altered mental status Nocturnal hypoxia qualifies for nocturnal oxygen. We will get palliative care consultation     Code Status:     Code Status Orders  (From admission, onward)           Start     Ordered   04/14/21 1748  Do not attempt resuscitation (DNR)  Continuous       Question Answer Comment  In the event of cardiac or respiratory ARREST Do not call a code blue   In the event of cardiac or respiratory ARREST Do not perform Intubation, CPR, defibrillation or  ACLS   In the event of cardiac or respiratory ARREST Use medication by any route, position, wound care, and other measures to relive pain and suffering. May use oxygen, suction and manual treatment of airway obstruction as needed for comfort.      04/14/21 1748           Code Status History     Date Active Date Inactive Code Status Order ID Comments User Context   10/22/2019 0837 10/25/2019 1757 DNR [id number]  Leonides Schanz Inpatient   10/03/2019 1720 10/22/2019 0837 Full Code [id number]  Leonides Schanz Inpatient   07/31/2019 1230 08/01/2019 2228 Full Code [id number]  Leonides Schanz Inpatient   03/01/2019 1604 03/02/2019 2136 Full Code [id number]  Katha Cabal, MD Inpatient   06/13/2018 1331 06/13/2018 1936 Full Code [id number]  Katha Cabal, MD Inpatient   08/12/2017 1158 08/12/2017 1700 Full Code [id number]  Schnier, Dolores Lory, MD Inpatient      Advance Directive Documentation    Flowsheet Row Most Recent Value  Type of Advance Directive Living will, Healthcare Power of Attorney, Out of facility DNR (pink MOST or yellow form)  Pre-existing out of facility DNR order (yellow form or pink MOST form) --  "MOST" Form in Place? --      Family Communication: Spoke with patient's daughter at the bedside Disposition Plan: Status is: [place]  [facility]

## 2021-04-19 NOTE — Plan of Care (Addendum)
Reached out to on call NP because pt was clearly uncomfortable - facial grimacing and agitation.  Got 1x order for 1 mg morphine w/little affect.  Continued agitation - gave Remeron 1x.  Pt is alert and responding appropriately.  She removed her O2 but satting 91-93% on RA.  She is also having pain at site of AKA.   Gave tylenol in hope it will relieve pain and help her rest.  Pt's son and daughter at bedside and tearful.  Stated "she is very different". Believe patient and family would benefit from Palliative consult.

## 2021-04-20 DIAGNOSIS — G9341 Metabolic encephalopathy: Secondary | ICD-10-CM | POA: Diagnosis not present

## 2021-04-20 DIAGNOSIS — I63432 Cerebral infarction due to embolism of left posterior cerebral artery: Secondary | ICD-10-CM | POA: Diagnosis not present

## 2021-04-20 DIAGNOSIS — I10 Essential (primary) hypertension: Secondary | ICD-10-CM | POA: Diagnosis not present

## 2021-04-20 DIAGNOSIS — N179 Acute kidney failure, unspecified: Secondary | ICD-10-CM | POA: Diagnosis not present

## 2021-04-20 MED ORDER — HYDRALAZINE HCL 50 MG PO TABS
25.0000 mg | ORAL_TABLET | Freq: Three times a day (TID) | ORAL | Status: DC
  Administered 2021-04-20 – 2021-04-23 (×9): 25 mg via ORAL
  Filled 2021-04-20 (×10): qty 1

## 2021-04-20 NOTE — NC FL2 (Signed)
[place] LEVEL OF CARE SCREENING TOOL     IDENTIFICATION  Patient Name: [name] Birthdate: [birth year] Sex: female Admission Date (Current Location): 04/14/2021  [facility] and [place] Number:  Engineering geologist and Address:  [facility], [address], [place],  [address]      Provider Number: [id number]  Attending Physician Name and Address:  Loletha Grayer, MD  Relative Name and Phone Number:  Fernanda Drum (Daughter)   [phone] [facility])    Current Level of Care: Hospital Recommended Level of Care: [place] Prior Approval Number:    Date Approved/Denied:   PASRR Number: [id number]  Discharge Plan: SNF    Current Diagnoses: Patient Active Problem List   Diagnosis Date Noted   Nocturnal hypoxia    Acute metabolic encephalopathy    Shortness of breath    Acute kidney injury superimposed on CKD [facility])    Phantom pain after amputation of lower extremity ([place])    Stroke ([place]) 04/14/2021   Insomnia 02/16/2021   Palliative care by specialist    DNR (do not resuscitate)    Advanced directive placed in chart this admission    Advanced directives, counseling/discussion    Weakness    Goals of care, counseling/discussion    Protein-calorie malnutrition, severe 10/12/2019   Ischemia of right lower extremity 10/03/2019   Ischemia of extremity 07/31/2019   Proteinuria 04/23/2019   Atherosclerosis of native arteries of the extremities with ulceration ([place]) 03/01/2019   Chronic vertebral fracture due to osteoporosis ([place]) 05/20/2018   Closed T10 fracture ([place]) 05/20/2018   Gout 03/27/2018   Hypothyroidism 03/27/2018   Subdural hematoma 03/08/2018   Abnormal SPEP 12/07/2017   History of vitamin D deficiency 11/30/2017   Hyperlipidemia associated with type 2 diabetes mellitus ([place]) 11/30/2017   PAD (peripheral artery disease) ([place]) 07/19/2017   Leg pain, lateral, left 07/19/2017   Diabetes  ([place]) 07/19/2017   Hyperlipidemia 07/19/2017   Accelerated hypertension 07/19/2017   Mild aortic valve stenosis 06/22/2017   Persistent atrial fibrillation ([place]) 06/22/2017   Moderate mitral insufficiency 10/30/2015   Chronic kidney disease, stage 3 unspecified ([place]) 09/19/2014   Type II or unspecified type diabetes mellitus with renal manifestations, not stated as uncontrolled(250.40) 11/08/2013   Vitamin D deficiency 11/08/2013    Orientation RESPIRATION BLADDER Height & Weight     Self  Normal Incontinent, External catheter Weight: 57.6 kg Height:  5\' 2"  (157.5 cm)  BEHAVIORAL SYMPTOMS/MOOD NEUROLOGICAL BOWEL NUTRITION STATUS      Incontinent Diet (DYS 3)  AMBULATORY STATUS COMMUNICATION OF NEEDS Skin    (Supine to sit mod assist, did not ambulate) Verbally Normal                       Personal Care Assistance Level of Assistance  Bathing, Feeding, Dressing Bathing Assistance: Limited assistance (total assist to lower body) Feeding assistance: Limited assistance Dressing Assistance: Limited assistance (Total assist Lower body)     Functional Limitations Info  Sight, Hearing, Speech Sight Info: Impaired Hearing Info: Impaired Speech Info:  (aphasic of late)    SPECIAL CARE FACTORS FREQUENCY  PT (By licensed PT), OT (By licensed OT)     PT Frequency: Min 5x weekly OT Frequency: Min 5x weekly            Contractures Contractures Info: Not present    Additional Factors Info  Code Status, Allergies Code Status Info: DNR Allergies Info: Sulfa antibiotics  Current Medications (04/20/2021):  This is the current hospital active medication list Current Facility-Administered Medications  Medication Dose Route Frequency Provider Last Rate Last Admin   acetaminophen (TYLENOL) tablet 650 mg  650 mg Oral Q6H PRN Loletha Grayer, MD   650 mg at 04/19/21 0201   amLODipine (NORVASC) tablet 10 mg  10 mg Oral Daily Loletha Grayer, MD   10 mg at 04/19/21 3428    aspirin chewable tablet 81 mg  81 mg Oral Daily Loletha Grayer, MD   81 mg at 04/19/21 0933   atorvastatin (LIPITOR) tablet 40 mg  40 mg Oral Daily Greta Doom, MD   40 mg at 04/19/21 7681   cholecalciferol (VITAMIN D3) tablet 1,000 Units  1,000 Units Oral Daily Loletha Grayer, MD   1,000 Units at 04/19/21 0933   docusate sodium (COLACE) capsule 100 mg  100 mg Oral BID PRN Tonye Royalty, NP       haloperidol lactate (HALDOL) injection 1 mg  1 mg Intravenous Q6H PRN Wieting, Richard, MD       hydrALAZINE (APRESOLINE) tablet 25 mg  25 mg Oral TID Loletha Grayer, MD       levothyroxine (SYNTHROID) tablet 88 mcg  88 mcg Oral QAC breakfast Loletha Grayer, MD   88 mcg at 04/20/21 0524   lidocaine (LIDODERM) 5 % 1 patch  1 patch Transdermal Q24H Ottie Glazier, MD   1 patch at 04/19/21 2117   mirtazapine (REMERON) tablet 7.5 mg  7.5 mg Oral QHS Loletha Grayer, MD   7.5 mg at 04/19/21 2117   multivitamin-lutein (OCUVITE-LUTEIN) capsule 1 capsule  1 capsule Oral QPM Loletha Grayer, MD   1 capsule at 04/19/21 1847   ondansetron (ZOFRAN) injection 4 mg  4 mg Intravenous Q6H PRN Tonye Royalty, NP   4 mg at 04/15/21 0236   pneumococcal 23 valent vaccine (PNEUMOVAX-23) injection 0.5 mL  0.5 mL Intramuscular Tomorrow-1000 Wieting, Richard, MD       polyethylene glycol (MIRALAX / GLYCOLAX) packet 17 g  17 g Oral BID Loletha Grayer, MD   17 g at 04/19/21 2117   sodium chloride flush (NS) 0.9 % injection 3 mL  3 mL Intravenous Once Merlyn Lot, MD         Discharge Medications: Please see discharge summary for a list of discharge medications.  Relevant Imaging Results:  Relevant Lab Results:   Additional Information SS #[id number]  Pete Pelt, RN

## 2021-04-20 NOTE — Progress Notes (Signed)
Physical Therapy Treatment Patient Details Name: [name] MRN: [id number] DOB: Aug 22, 1925 Today's Date: 04/20/2021   History of Present Illness Pt is admitted for CVA, positive through L BG and occipital CVA. Pt is s/p TNK on 1/3. History includes R frontal/temporal craniotomy, Afib, R LE AKA, DM, and HTN.    PT Comments    Pt seen for PT tx with son present. Failed attempt earlier in day as pt still eating breakfast at 10:15AM. Pt agreeable during second attempt & on room air throughout session with SpO2 >90% (nurse aware). Pt is able to complete supine>sit with HOB initially elevated but then flat, with supervision & extra time. Pt successfully completes bed>drop arm recliner via lateral scoot with max assist with PT providing max assist to facilitate pivot/scoot as pt unable. Pt engages in LLE strengthening exercises & tolerates session well. Will continue to follow pt acutely to progress strength & independence with transfers.     Recommendations for follow up therapy are one component of a multi-disciplinary discharge planning process, led by the attending physician.  Recommendations may be updated based on patient status, additional functional criteria and insurance authorization.  Follow Up Recommendations  Skilled nursing-short term rehab (<3 hours/day)     Assistance Recommended at Discharge Frequent or constant Supervision/Assistance  Patient can return home with the following A lot of help with walking and/or transfers;A lot of help with bathing/dressing/bathroom;Assistance with cooking/housework;Assist for transportation;Assistance with feeding;Help with stairs or ramp for entrance   Equipment Recommendations  BSC/3in1 (drop arm 3-in-1 BSC)    Recommendations for Other Services       Precautions / Restrictions Precautions Precautions: Fall Restrictions Weight Bearing Restrictions: No Other Position/Activity Restrictions: old R AKA     Mobility  Bed Mobility Overal  bed mobility: Needs Assistance Bed Mobility: Supine to Sit     Supine to sit: Supervision;HOB elevated     General bed mobility comments: use of bed rails, extra time & cuing to scoot to sitting EOB    Transfers Overall transfer level: Needs assistance Equipment used: None Transfers: Bed to chair/wheelchair/BSC            Lateral/Scoot Transfers: Max assist (son present & electing to assist PRN) General transfer comment: Pt is able to perform anterior weight shift well to clear buttocks but unable to complete pivot or scoot without max/total assist for that portion of transfer. Pt's R AKA does become wedged between bed & recliner with pt unaware (no injury/redness noted), requiring PT assistance to reposition RLE. Pt is able to successfully complete lateral scoot bed>drop arm recliner with max assist and extra time.    Ambulation/Gait                   Stairs             Wheelchair Mobility    Modified Rankin (Stroke Patients Only)       Balance Overall balance assessment: Needs assistance Sitting-balance support: Feet supported (LLE supported 2/2 R AKA) Sitting balance-[name] Scale: Fair Sitting balance - Comments: supervision static sitting EOB                                    Cognition Arousal/Alertness: Awake/alert Behavior During Therapy: Flat affect Overall Cognitive Status: History of cognitive impairments - at baseline  Exercises General Exercises - Lower Extremity Long Arc Quad: AROM;Strengthening;Left;10 reps;Seated Hip ABduction/ADduction: AAROM;Strengthening;Left;10 reps (long sitting in recliner) Straight Leg Raises: AAROM;Strengthening;Left;10 reps (long sitting in recliner) Hip Flexion/Marching: AROM;Strengthening;Left;10 reps;Seated    General Comments General comments (skin integrity, edema, etc.): Pt reviewed how to doff chair alarm belt with pt return  demostrating & son present to observe/hear education      Pertinent Vitals/Pain Pain Assessment: 0-10    Home Living                          Prior Function            PT Goals (current goals can now be found in the care plan section) Acute Rehab PT Goals Patient Stated Goal: increase independence with mobility PT Goal Formulation: With family Time For Goal Achievement: 04/30/21 Potential to Achieve Goals: Fair Progress towards PT goals: Progressing toward goals    Frequency    7X/week      PT Plan Current plan remains appropriate    Co-evaluation              AM-PAC PT "6 Clicks" Mobility   Outcome Measure  Help needed turning from your back to your side while in a flat bed without using bedrails?: A Little Help needed moving from lying on your back to sitting on the side of a flat bed without using bedrails?: A Little Help needed moving to and from a bed to a chair (including a wheelchair)?: A Lot Help needed standing up from a chair using your arms (e.g., wheelchair or bedside chair)?: Total Help needed to walk in hospital room?: Total Help needed climbing 3-5 steps with a railing? : Total 6 Click Score: 11    End of Session Equipment Utilized During Treatment: Gait belt Activity Tolerance: Patient tolerated treatment well Patient left: in chair;with chair alarm set (chair belt alarm donned) Nurse Communication: Mobility status PT Visit Diagnosis: Unsteadiness on feet (R26.81);Muscle weakness (generalized) (M62.81);Other abnormalities of gait and mobility (R26.89)     Time: 7505-1833 PT Time Calculation (min) (ACUTE ONLY): 23 min  Charges:  $Therapeutic Exercise: 8-22 mins $Therapeutic Activity: 8-22 mins                     [name], PT, DPT 04/20/21, 11:53 AM    [name] 04/20/2021, 11:52 AM

## 2021-04-20 NOTE — TOC Progression Note (Signed)
Transition of Care [facility]) - Progression Note    Patient Details  Name: [name] MRN: [id number] Date of Birth: 1925-08-02  Transition of Care [facility]) CM/SW [place], RN Phone Number: 04/20/2021, 10:30 AM  Clinical Narrative:   Spoke to daughter Fernanda Drum (Daughter)  [phone] [facility]).  Daughter states she would like to proceed with SNF at this time.  SNF workup started, will communicate bed offers to daughter.  Spoke to daughter about Palliative Care referral.  Explained Palliative Care.  Daughter verbally understood and accepted.  TOC to follow to discharge.    Expected Discharge Plan:  (TBD) Barriers to Discharge: Continued Medical Work up  Expected Discharge Plan and Services Expected Discharge Plan:  (TBD)       Living arrangements for the past 2 months: Single Family Home                                       Social Determinants of Health (SDOH) Interventions    Readmission Risk Interventions No flowsheet data found.

## 2021-04-20 NOTE — Progress Notes (Signed)
Patient ID: [name], female   DOB: [birth year], 88 y.o.   MRN: [id number] [facility] PROGRESS NOTE  [name] IDP:[id number] DOB: 1926/01/22 DOA: 04/14/2021 PCP: Sofie Hartigan, MD  HPI/Subjective: Patient awakened from sleep.  Son states that he had a decent day yesterday.  Patient was tired this morning.  Did not answer few questions.  Initially admitted with stroke.  Objective: Vitals:   04/20/21 0757 04/20/21 1158  BP: (!) 150/64 (!) 149/68  Pulse: 73 76  Resp: 20 20  Temp: 98.2 F (36.8 C) 98.2 F (36.8 C)  SpO2: 97% 100%    Intake/Output Summary (Last 24 hours) at 04/20/2021 1529 Last data filed at 04/20/2021 1300 Gross per 24 hour  Intake 986.08 ml  Output 1150 ml  Net -163.92 ml   Filed Weights   04/19/21 0500 04/20/21 0500 04/20/21 0620  Weight: 57.4 kg 55.7 kg 57.6 kg    ROS: Review of Systems  Respiratory:  Positive for shortness of breath.   Cardiovascular:  Negative for chest pain.  Gastrointestinal:  Negative for abdominal pain, nausea and vomiting.  Exam: Physical Exam HENT:     Head: Normocephalic.     Mouth/Throat:     Pharynx: No oropharyngeal exudate.  Eyes:     General: Lids are normal.     Conjunctiva/sclera: Conjunctivae normal.  Cardiovascular:     Rate and Rhythm: Normal rate and regular rhythm.     Heart sounds: Normal heart sounds, S1 normal and S2 normal.  Pulmonary:     Breath sounds: Examination of the right-lower field reveals decreased breath sounds. Examination of the left-lower field reveals decreased breath sounds. Decreased breath sounds present. No wheezing, rhonchi or rales.  Abdominal:     Palpations: Abdomen is soft.     Tenderness: There is no abdominal tenderness.  Musculoskeletal:     Left lower leg: No swelling.  Skin:    General: Skin is warm.     Findings: No rash.  Neurological:     Mental Status: She is lethargic.      Scheduled Meds:  amLODipine  10 mg Oral Daily   aspirin  81 mg Oral Daily    atorvastatin  40 mg Oral Daily   cholecalciferol  1,000 Units Oral Daily   hydrALAZINE  25 mg Oral TID   levothyroxine  88 mcg Oral QAC breakfast   lidocaine  1 patch Transdermal Q24H   mirtazapine  7.5 mg Oral QHS   multivitamin-lutein  1 capsule Oral QPM   polyethylene glycol  17 g Oral BID   sodium chloride flush  3 mL Intravenous Once    Assessment/Plan:  Acute metabolic encephalopathy.  Mental status seems to wax and wane. Embolic stroke with persistent atrial fibrillation.  Patient presented with aphasia and received TNK.  Patient on aspirin now.  Neurology recommends Eliquis in 2 weeks.  Patient on atorvastatin and last LDL 110. Accelerated hypertension upon coming in.  Patient on Norvasc.  Added hydralazine and increase the dose this morning. Acute kidney injury on chronic kidney disease stage IIIb.  Creatinine went up to 2.06 on 04/17/2021.  Yesterday's creatinine down to 1.52. Hyperlipidemia unspecified on atorvastatin Hypothyroidism unspecified on levothyroxine Nocturnal hypoxia qualifies for nocturnal oxygen For the leg pain.  Holding gabapentin and Lyrica secondary to altered mental status Palliative care consultation Weakness.  Physical therapy recommends rehab.  Transitional care team started bed search    Code Status:     Code Status Orders  (From  admission, onward)           Start     Ordered   04/14/21 1748  Do not attempt resuscitation (DNR)  Continuous       Question Answer Comment  In the event of cardiac or respiratory ARREST Do not call a code blue   In the event of cardiac or respiratory ARREST Do not perform Intubation, CPR, defibrillation or ACLS   In the event of cardiac or respiratory ARREST Use medication by any route, position, wound care, and other measures to relive pain and suffering. May use oxygen, suction and manual treatment of airway obstruction as needed for comfort.      04/14/21 1748           Code Status History     Date  Active Date Inactive Code Status Order ID Comments User Context   10/22/2019 0837 10/25/2019 1757 DNR [id number]  Leonides Schanz Inpatient   10/03/2019 1720 10/22/2019 0837 Full Code [id number]  Leonides Schanz Inpatient   07/31/2019 1230 08/01/2019 2228 Full Code [id number]  Leonides Schanz Inpatient   03/01/2019 1604 03/02/2019 2136 Full Code [id number]  Katha Cabal, MD Inpatient   06/13/2018 1331 06/13/2018 1936 Full Code [id number]  Katha Cabal, MD Inpatient   08/12/2017 1158 08/12/2017 1700 Full Code [id number]  Schnier, Dolores Lory, MD Inpatient      Advance Directive Documentation    Flowsheet Row Most Recent Value  Type of Advance Directive Living will, Healthcare Power of Attorney, Out of facility DNR (pink MOST or yellow form)  Pre-existing out of facility DNR order (yellow form or pink MOST form) --  "MOST" Form in Place? --      Family Communication: Spoke with son at the bedside Disposition Plan: Status is: Inpatient.  Transitional care team looking into rehab beds.  [place]  [facility] [employer]

## 2021-04-21 DIAGNOSIS — Z66 Do not resuscitate: Secondary | ICD-10-CM | POA: Diagnosis not present

## 2021-04-21 DIAGNOSIS — I63432 Cerebral infarction due to embolism of left posterior cerebral artery: Secondary | ICD-10-CM | POA: Diagnosis not present

## 2021-04-21 DIAGNOSIS — Z515 Encounter for palliative care: Secondary | ICD-10-CM

## 2021-04-21 DIAGNOSIS — I10 Essential (primary) hypertension: Secondary | ICD-10-CM | POA: Diagnosis not present

## 2021-04-21 DIAGNOSIS — G9341 Metabolic encephalopathy: Secondary | ICD-10-CM | POA: Diagnosis not present

## 2021-04-21 DIAGNOSIS — N179 Acute kidney failure, unspecified: Secondary | ICD-10-CM | POA: Diagnosis not present

## 2021-04-21 DIAGNOSIS — I639 Cerebral infarction, unspecified: Secondary | ICD-10-CM | POA: Diagnosis not present

## 2021-04-21 LAB — BASIC METABOLIC PANEL
Anion gap: 8 (ref 5–15)
BUN: 29 mg/dL — ABNORMAL HIGH (ref 8–23)
CO2: 24 mmol/L (ref 22–32)
Calcium: 8.9 mg/dL (ref 8.9–10.3)
Chloride: 107 mmol/L (ref 98–111)
Creatinine, Ser: 1.55 mg/dL — ABNORMAL HIGH (ref 0.44–1.00)
GFR, Estimated: 31 mL/min — ABNORMAL LOW (ref >60)
Glucose, Bld: 185 mg/dL — ABNORMAL HIGH (ref 70–99)
Potassium: 3.9 mmol/L (ref 3.5–5.1)
Sodium: 139 mmol/L (ref 135–145)

## 2021-04-21 LAB — GLUCOSE, CAPILLARY: Glucose-Capillary: 183 mg/dL — ABNORMAL HIGH (ref 70–99)

## 2021-04-21 MED ORDER — MAGNESIUM SULFATE 2 GM/50ML IV SOLN
2.0000 g | Freq: Once | INTRAVENOUS | Status: AC
  Administered 2021-04-21: 10:00:00 2 g via INTRAVENOUS
  Filled 2021-04-21: qty 50

## 2021-04-21 MED ORDER — INSULIN ASPART 100 UNIT/ML IJ SOLN
0.0000 [IU] | Freq: Every day | INTRAMUSCULAR | Status: DC
  Administered 2021-04-22: 20:00:00 3 [IU] via SUBCUTANEOUS
  Filled 2021-04-21: qty 1

## 2021-04-21 MED ORDER — MIRTAZAPINE 15 MG PO TABS
15.0000 mg | ORAL_TABLET | Freq: Every day | ORAL | Status: DC
  Administered 2021-04-21 – 2021-04-24 (×4): 15 mg via ORAL
  Filled 2021-04-21 (×4): qty 1

## 2021-04-21 MED ORDER — INSULIN ASPART 100 UNIT/ML IJ SOLN
0.0000 [IU] | Freq: Three times a day (TID) | INTRAMUSCULAR | Status: DC
  Administered 2021-04-22 (×2): 2 [IU] via SUBCUTANEOUS
  Administered 2021-04-23: 1 [IU] via SUBCUTANEOUS
  Administered 2021-04-23 (×2): 2 [IU] via SUBCUTANEOUS
  Administered 2021-04-24: 1 [IU] via SUBCUTANEOUS
  Administered 2021-04-24: 18:00:00 3 [IU] via SUBCUTANEOUS
  Administered 2021-04-25: 15:00:00 5 [IU] via SUBCUTANEOUS
  Filled 2021-04-21 (×7): qty 1

## 2021-04-21 NOTE — Progress Notes (Signed)
Patient ID: [name], female   DOB: [birth year], 88 y.o.   MRN: [id number] [facility] PROGRESS NOTE  Shaleta Ruacho [place] BTD:[id number] DOB: [birth year] DOA: 04/14/2021 PCP: Sofie Hartigan, MD  HPI/Subjective: Patient seen this morning and complained of a headache.  Patient again awakened from sleep this morning.  No family at bedside today.  Initially admitted with embolic stroke and aphasia and received TNKase.  Objective: Vitals:   04/21/21 1100 04/21/21 1552  BP: 138/64 (!) 143/65  Pulse: 67 67  Resp: 15 (!) 21  Temp: 97.7 F (36.5 C) (!) 97.5 F (36.4 C)  SpO2: 97% 99%    Intake/Output Summary (Last 24 hours) at 04/21/2021 1618 Last data filed at 04/21/2021 1300 Gross per 24 hour  Intake 480 ml  Output 500 ml  Net -20 ml   Filed Weights   04/19/21 0500 04/20/21 0500 04/20/21 0620  Weight: 57.4 kg 55.7 kg 57.6 kg    ROS: Review of Systems  Respiratory:  Negative for shortness of breath.   Cardiovascular:  Negative for chest pain.  Gastrointestinal:  Negative for abdominal pain.  Neurological:  Positive for headaches.  Exam: Physical Exam HENT:     Head: Normocephalic.     Mouth/Throat:     Pharynx: No oropharyngeal exudate.  Eyes:     General: Lids are normal.     Conjunctiva/sclera: Conjunctivae normal.  Cardiovascular:     Rate and Rhythm: Normal rate and regular rhythm.     Heart sounds: Normal heart sounds, S1 normal and S2 normal.  Pulmonary:     Breath sounds: No decreased breath sounds, wheezing, rhonchi or rales.  Abdominal:     Palpations: Abdomen is soft.     Tenderness: There is no abdominal tenderness.  Musculoskeletal:     Right lower leg: No swelling.     Left lower leg: No swelling.  Skin:    General: Skin is warm.     Findings: No rash.  Neurological:     Mental Status: She is lethargic.      Scheduled Meds:  amLODipine  10 mg Oral Daily   aspirin  81 mg Oral Daily   atorvastatin  40 mg Oral Daily   cholecalciferol  1,000  Units Oral Daily   hydrALAZINE  25 mg Oral TID   levothyroxine  88 mcg Oral QAC breakfast   lidocaine  1 patch Transdermal Q24H   mirtazapine  7.5 mg Oral QHS   multivitamin-lutein  1 capsule Oral QPM   polyethylene glycol  17 g Oral BID   sodium chloride flush  3 mL Intravenous Once   Brief history 88 year old female with past medical history of atrial fibrillation, congestive heart failure, type 2 diabetes mellitus, essential hypertension.  She presented to the hospital with aphasia and was given TNKase for acute stroke.  MRI of the brain showed multifocal acute ischemic infarcts involving the left basal ganglia and posterior left parietal-occipital region.  Repeat CT scan on 04/17/2021 did not show any acute infarct.  Patient is still very weak and has fluctuating altered mental status.  Assessment/Plan:  Acute metabolic encephalopathy.  Mental status does seem to wax and wane here.  Palliative care consultation ordered.  Increase Remeron to 30 mg nightly. Embolic stroke with persistent atrial fibrillation.  Patient presented with aphasia and received TNKase.  Patient on aspirin now.  Neurology recommended Eliquis in 2 weeks.  Patient on atorvastatin.  Last LDL 110. Accelerated hypertension upon coming in.  Patient currently now  on Norvasc and added hydralazine. Acute kidney injury on chronic kidney disease stage IIIb.  Creatinine went up to 2.06 on 04/17/2021.  Today's creatinine 1.55. Hyperlipidemia unspecified on atorvastatin Hypothyroidism unspecified on levothyroxine Nocturnal hypoxia.  Qualifies for nocturnal oxygen with overnight sleep study.  Results in paper chart. Type 2 diabetes mellitus.  Last hemoglobin A1c 7.7.  We will have on sliding scale for now. Phantom leg pain.  Holding gabapentin and Lyrica secondary to altered mental status Weakness.  Physical therapy recommends rehab.  Transitional care team to give bed offer and hopefully start insurance authorization. Headache.  As  needed Tylenol.  I did give IV magnesium.        Code Status:     Code Status Orders  (From admission, onward)           Start     Ordered   04/14/21 1748  Do not attempt resuscitation (DNR)  Continuous       Question Answer Comment  In the event of cardiac or respiratory ARREST Do not call a code blue   In the event of cardiac or respiratory ARREST Do not perform Intubation, CPR, defibrillation or ACLS   In the event of cardiac or respiratory ARREST Use medication by any route, position, wound care, and other measures to relive pain and suffering. May use oxygen, suction and manual treatment of airway obstruction as needed for comfort.      04/14/21 1748           Code Status History     Date Active Date Inactive Code Status Order ID Comments User Context   10/22/2019 0837 10/25/2019 1757 DNR [id number]  Leonides Schanz Inpatient   10/03/2019 1720 10/22/2019 0837 Full Code [id number]  Leonides Schanz Inpatient   07/31/2019 1230 08/01/2019 2228 Full Code [id number]  Leonides Schanz Inpatient   03/01/2019 1604 03/02/2019 2136 Full Code [id number]  Katha Cabal, MD Inpatient   06/13/2018 1331 06/13/2018 1936 Full Code [id number]  Katha Cabal, MD Inpatient   08/12/2017 1158 08/12/2017 1700 Full Code [id number]  Schnier, Dolores Lory, MD Inpatient      Advance Directive Documentation    Flowsheet Row Most Recent Value  Type of Advance Directive Living will, Healthcare Power of Attorney, Out of facility DNR (pink MOST or yellow form)  Pre-existing out of facility DNR order (yellow form or pink MOST form) --  "MOST" Form in Place? --      Family Communication: Left message for patient's daughter Disposition Plan: Status is: Inpatient.  Transitional care team to give a bed offer today and hopefully start insurance authorization process   [place]

## 2021-04-21 NOTE — Progress Notes (Signed)
PT Cancellation Note  Patient Details Name: Tory Mckissack [place] MRN: [id number] DOB: May 02, 1925   Cancelled Treatment:     Therapist in several times today. Pt very lethargic, apparently did not sleep last night and is unable to tolerate any activity. Pt's son stated he had to feed her lunch and agrees that today is not a good day for PT/OT services. Will plan to see pt next available time/date.   ARMENTHA BRANAGAN 04/21/2021, 2:21 PM

## 2021-04-21 NOTE — TOC Progression Note (Signed)
Transition of Care [facility]) - Progression Note    Patient Details  Name: [name] MRN: [id number] Date of Birth: 1925-04-22  Transition of Care [facility]) CM/SW [place], RN Phone Number: 04/21/2021, 4:24 PM  Clinical Narrative:   Left message with daughter with bed offers, awaiting return call TOC to follow    Expected Discharge Plan:  (TBD) Barriers to Discharge: Continued Medical Work up  Expected Discharge Plan and Services Expected Discharge Plan:  (TBD)       Living arrangements for the past 2 months: Single Family Home                                       Social Determinants of Health (SDOH) Interventions    Readmission Risk Interventions No flowsheet data found.

## 2021-04-21 NOTE — Progress Notes (Signed)
OT Cancellation Note  Patient Details Name: [name] MRN: [id number] DOB: [birth year]   Cancelled Treatment:    Reason Eval/Treat Not Completed: Other (comment). Pt with increased lethargy today per staff report. Son declined therapeutic intervention for pt secondary to pt not feeling well. OT will re-attempt at next available time.  Darleen Crocker, [place], OTR/L , CBIS ascom [phone]  04/21/21, 2:31 PM

## 2021-04-21 NOTE — Consult Note (Signed)
Consultation Note Date: 04/21/2021   Patient Name: [name]  DOB: 1925-08-10  MRN: [id number]  Age / Sex: 88 y.o., female  PCP: Sofie Hartigan, MD Referring Physician: Loletha Grayer, MD  Reason for Consultation: Establishing goals of care  HPI/Patient Profile: 88 y.o. female  with past medical history of right AKA admitted on 04/14/2021 with facial droop and expressive aphasia. Code stroke was initiated and pt was given TNA.    Clinical Assessment and Goals of Care: I have reviewed medical records including EPIC notes, labs and imaging, assessed the patient and then met with patient, her son, and her daughter at bedside  to discuss diagnosis prognosis, GOC, EOL wishes, disposition and options.  I introduced Palliative Medicine as specialized medical care for people living with serious illness. It focuses on providing relief from the symptoms and stress of a serious illness. The goal is to improve quality of life for both the patient and the family.  We discussed a brief life review of the patient. She has four children and her husband passed in 2020. She is "fiercely independent" and likes to be a caregiver of others, especially her family.  As far as functional and nutritional status pt was independent with a wheelchair after her right AKA a few years ago. She was able to complete ADLs independently and had a healthy appetite.  We discussed patient's current illness and what it means in the larger context of patient's on-going co-morbidities.  Natural disease trajectory and expectations at EOL were discussed.  I attempted to elicit values and goals of care important to the patient. Family shares she made her wishes known in a living will when she had her AKA a few years ago. They convey she would not want to be resuscitated and would not want to suffer.  The difference between aggressive medical  intervention and comfort care was considered in light of the patient's goals of care.   Education offered regarding concept specific to human mortality and the limitations of medical interventions to prolong life when the body begins to fail to thrive. Patient's waxing and waning and inability to participate in decision making has made it difficult for family to know what plan of care to move forward with. Time for outcomes needed.   Family is facing treatment option decisions. Therapeutic silence and active listening given to allow family to share thoughts and emotions regarding patient's current health status.    Discussed with patient/family the importance of continued conversation with family and the medical providers regarding overall plan of care and treatment options, ensuring decisions are within the context of the patients values and GOCs.    Hospice and Palliative Care services outpatient were explained in detail. Daughter shares she wants to do what is "right" by her mother but is unsure of what that is.   Questions and concerns were addressed. The family was encouraged to call with questions or concerns.   Primary Decision Maker HCPOA  Code Status/Advance Care Planning: DNR  Prognosis:  Unable to determine  Discharge Planning: To Be Determined  Primary Diagnoses: Present on Admission:  Stroke [facility])   Physical Exam Vitals and nursing note reviewed.  Constitutional:      General: She is not in acute distress.    Appearance: She is not ill-appearing or toxic-appearing.  HENT:     Head: Normocephalic and atraumatic.  Eyes:     Comments: Left eyelid droop  Cardiovascular:     Rate and Rhythm: Normal rate.     Pulses: Normal pulses.  Pulmonary:     Effort: Pulmonary effort is normal.  Abdominal:     Palpations: Abdomen is soft.  Musculoskeletal:     Comments: Generalized weakness  Skin:    General: Skin is warm and dry.  Neurological:     Comments: nonverbal     Vital Signs: BP (!) 143/65 (BP Location: Left Arm)    Pulse 67    Temp (!) 97.5 F (36.4 C) (Oral)    Resp (!) 21    Ht '5\' 2"'  (1.575 m)    Wt 57.6 kg    SpO2 99%    BMI 23.23 kg/m  Pain Scale: 0-10   Pain Score: 2  SpO2: SpO2: 99 % O2 Device:SpO2: 99 % O2 Flow Rate: .O2 Flow Rate (L/min): 2 L/min  Palliative Assessment/Data: 40%     I discussed this patient's plan of care with patient, patient's HCPOA/son and daughter.  Thank you for this consult. Palliative medicine will continue to follow and assist holistically.   Time Total: 70 minutes Greater than 50%  of this time was spent counseling and coordinating care related to the above assessment and plan.  Signed by: Jordan Hawks, DNP, FNP-BC Palliative Medicine    Please contact Palliative Medicine Team phone at [phone] for questions and concerns.  For individual provider: See Shea Evans

## 2021-04-22 ENCOUNTER — Inpatient Hospital Stay: Payer: Medicare Other

## 2021-04-22 DIAGNOSIS — I63432 Cerebral infarction due to embolism of left posterior cerebral artery: Secondary | ICD-10-CM | POA: Diagnosis not present

## 2021-04-22 DIAGNOSIS — I639 Cerebral infarction, unspecified: Secondary | ICD-10-CM | POA: Diagnosis not present

## 2021-04-22 DIAGNOSIS — G9341 Metabolic encephalopathy: Secondary | ICD-10-CM | POA: Diagnosis not present

## 2021-04-22 DIAGNOSIS — I10 Essential (primary) hypertension: Secondary | ICD-10-CM | POA: Diagnosis not present

## 2021-04-22 DIAGNOSIS — G4734 Idiopathic sleep related nonobstructive alveolar hypoventilation: Secondary | ICD-10-CM | POA: Diagnosis not present

## 2021-04-22 DIAGNOSIS — N179 Acute kidney failure, unspecified: Secondary | ICD-10-CM | POA: Diagnosis not present

## 2021-04-22 LAB — GLUCOSE, CAPILLARY
Glucose-Capillary: 173 mg/dL — ABNORMAL HIGH (ref 70–99)
Glucose-Capillary: 154 mg/dL — ABNORMAL HIGH (ref 70–99)
Glucose-Capillary: 242 mg/dL — ABNORMAL HIGH (ref 70–99)
Glucose-Capillary: 254 mg/dL — ABNORMAL HIGH (ref 70–99)

## 2021-04-22 NOTE — TOC Progression Note (Signed)
Transition of Care [facility]) - Progression Note    Patient Details  Name: Jaycey Gens Whiteaker MRN: [id number] Date of Birth: 18-Apr-1925  Transition of Care [facility]) CM/SW [place], RN Phone Number: 04/22/2021, 9:50 AM  Clinical Narrative:  Patient's daughter states she is confused about whether to send patient to rehab.  If patient goes to SNF rehab, she would like patient to go to [facility], bed accepted in hub in the event patient transfers.    RNCM discussed rehab expectations, patient wishes and information that was provided during patient's stay from the care team with daughter, and that daughter should discuss this information with family to assist with decision making.  Daughter stated she plans to do this later today.  Daughter would like to speak with Palliative further.  Curt Bears advised, states she will speak to daughter later today.  TOC contact information provided, TOC to follow to discharge.  Expected Discharge Plan:  (TBD) Barriers to Discharge: Continued Medical Work up  Expected Discharge Plan and Services Expected Discharge Plan:  (TBD)       Living arrangements for the past 2 months: Single Family Home                                       Social Determinants of Health (SDOH) Interventions    Readmission Risk Interventions No flowsheet data found.

## 2021-04-22 NOTE — Progress Notes (Signed)
Physical Therapy Treatment Patient Details Name: [name] MRN: [id number] DOB: 09-18-1925 Today's Date: 04/22/2021   History of Present Illness Pt is admitted for CVA, positive through L BG and occipital CVA. Pt is s/p TNK on 1/3. History includes R frontal/temporal craniotomy, Afib, R LE AKA, DM, and HTN.    PT Comments    Pt was finishing up OT session upon arriving. Supportive daughter at bedside. Pt is agreeable to session however endorsing a little fatigue. Upon sitting up EOB, notice she had had BM. OT returned and assisted author with standing and peri care post BM. Pt stood 1 x EOB prior to pivot to [facility]: Lambert Rhonda W. Continued to finish BM prior to standing and returning to EOB. Overall pt tolerated well but is very deconditioned. She requested to return to bed after having BM and performing only 2 x stand pivot. Pt will need extensive PT to return to PLOF.    Recommendations for follow up therapy are one component of a multi-disciplinary discharge planning process, led by the attending physician.  Recommendations may be updated based on patient status, additional functional criteria and insurance authorization.  Follow Up Recommendations  Skilled nursing-short term rehab (<3 hours/day)     Assistance Recommended at Discharge Frequent or constant Supervision/Assistance  Patient can return home with the following A lot of help with walking and/or transfers;A lot of help with bathing/dressing/bathroom;Assistance with cooking/housework;Assist for transportation;Assistance with feeding;Help with stairs or ramp for entrance   Equipment Recommendations  Other (comment);BSC/3in1 (ongoing assessment)    Recommendations for Other Services       Precautions / Restrictions Precautions Precautions: Fall Restrictions Weight Bearing Restrictions: No Other Position/Activity Restrictions: old R AKA     Mobility  Bed Mobility Overal bed mobility: Needs Assistance Bed Mobility: Supine to Sit      Supine to sit: Supervision;HOB elevated Sit to supine: Min assist   General bed mobility comments: use of bed rails, extra time & cuing to scoot to sitting EOB. required min-mod assist once fatigued to return to bed and reposition to [facility]    Transfers Overall transfer level: Needs assistance Equipment used: None Transfers: Sit to/from Stand;Bed to chair/wheelchair/BSC Sit to Stand: Mod assist;From elevated surface Stand pivot transfers: Max assist   Ambulation/Gait  General Gait Details: non ambulatory at baseline     Balance Overall balance assessment: Needs assistance Sitting-balance support: Feet supported;No upper extremity supported Sitting balance-[name] Scale: Fair Sitting balance - Comments: Maintain balance sitting EOB during seated grooming tasks   Standing balance support: No upper extremity supported;During functional activity Standing balance-[name] Scale: Poor Standing balance comment: needs constant assistance to maintain sitting balance at EOB       Cognition Arousal/Alertness: Awake/alert Behavior During Therapy: Flat affect Overall Cognitive Status: History of cognitive impairments - at baseline      General Comments: Pt is A but only oriented x 3. she is cooperative however somewhat fatigued throughout session.               Pertinent Vitals/Pain Pain Assessment: No/denies pain Faces Pain Scale: No hurt Pain Location: stomach Pain Descriptors / Indicators: Discomfort Pain Intervention(s): Limited activity within patient's tolerance;Monitored during session;Premedicated before session;Repositioned     PT Goals (current goals can now be found in the care plan section) Acute Rehab PT Goals Patient Stated Goal: none stated Progress towards PT goals: Progressing toward goals    Frequency    7X/week      PT Plan Current plan remains appropriate  Co-evaluation   Reason for Co-Treatment: For patient/therapist safety;To address  functional/ADL transfers PT goals addressed during session: Mobility/safety with mobility;Balance;Proper use of DME;Strengthening/ROM OT goals addressed during session: ADL's and self-care      AM-PAC PT "6 Clicks" Mobility   Outcome Measure  Help needed turning from your back to your side while in a flat bed without using bedrails?: A Little Help needed moving from lying on your back to sitting on the side of a flat bed without using bedrails?: A Little Help needed moving to and from a bed to a chair (including a wheelchair)?: A Lot Help needed standing up from a chair using your arms (e.g., wheelchair or bedside chair)?: A Lot Help needed to walk in hospital room?: Total Help needed climbing 3-5 steps with a railing? : Total 6 Click Score: 12    End of Session   Activity Tolerance: Patient tolerated treatment well Patient left: in bed;with call bell/phone within reach;with bed alarm set;with family/visitor present;Other (comment) (OT /PT cotreat for some of session) Nurse Communication: Mobility status PT Visit Diagnosis: Unsteadiness on feet (R26.81);Muscle weakness (generalized) (M62.81);Other abnormalities of gait and mobility (R26.89)     Time: 1510-1520 PT Time Calculation (min) (ACUTE ONLY): 10 min  Charges:  $Therapeutic Activity: 8-22 mins                     Julaine Fusi PTA 04/22/21, 4:34 PM

## 2021-04-22 NOTE — Progress Notes (Signed)
Patient ID: [name], female   DOB: [birth year], 88 y.o.   MRN: [id number] [facility] PROGRESS NOTE  [name] MWN:[id number] DOB: 14-May-1925 DOA: 04/14/2021 PCP: Sofie Hartigan, MD  HPI/Subjective: Brief history 88 year old female with past medical history of atrial fibrillation, not on AC, congestive heart failure, type 2 diabetes mellitus, essential hypertension.  She presented to the hospital with aphasia and was given TNKase for acute stroke.  MRI of the brain showed multifocal acute ischemic infarcts involving the left basal ganglia and posterior left parietal-occipital region.  Repeat CT scan on 04/17/2021 did not show any acute infarct.  Patient is still very weak and has fluctuating altered mental status.   Today, patient reports not feeling well, but unable to tell me further.  Reported she wants to have a BM.  Denies any headaches, chest pain, abdominal pain, nausea/vomiting.  Objective: Vitals:   04/22/21 0743 04/22/21 1226  BP: 124/63 (!) 142/67  Pulse: 70 73  Resp: 20 18  Temp: 98.1 F (36.7 C) 98.2 F (36.8 C)  SpO2: 98% 94%    Intake/Output Summary (Last 24 hours) at 04/22/2021 1546 Last data filed at 04/22/2021 1300 Gross per 24 hour  Intake 480 ml  Output 400 ml  Net 80 ml    Filed Weights   04/19/21 0500 04/20/21 0500 04/20/21 0620  Weight: 57.4 kg 55.7 kg 57.6 kg     Exam: General: NAD, very deconditioned Cardiovascular: S1, S2 present Respiratory: CTAB Abdomen: Soft, nontender, nondistended, bowel sounds present Musculoskeletal: No bilateral pedal edema noted Skin: Normal Psychiatry: Normal mood   Scheduled Meds:  amLODipine  10 mg Oral Daily   aspirin  81 mg Oral Daily   atorvastatin  40 mg Oral Daily   cholecalciferol  1,000 Units Oral Daily   hydrALAZINE  25 mg Oral TID   insulin aspart  0-5 Units Subcutaneous QHS   insulin aspart  0-9 Units Subcutaneous TID WC   levothyroxine  88 mcg Oral QAC breakfast   lidocaine  1 patch  Transdermal Q24H   mirtazapine  15 mg Oral QHS   multivitamin-lutein  1 capsule Oral QPM   polyethylene glycol  17 g Oral BID   sodium chloride flush  3 mL Intravenous Once     Assessment/Plan:  Acute metabolic encephalopathy.   Mental status does seem to wax and wane. Palliative care consultation ordered.  Increase Remeron to 30 mg nightly  Embolic stroke with persistent atrial fibrillation.   Patient presented with aphasia and received TNKase.  Patient on aspirin now.  Neurology recommended Eliquis in 2 weeks.  Patient on atorvastatin.  Last LDL 110.  Accelerated hypertension on presentation  BP improved Continue Norvasc and added hydralazine.  Acute kidney injury on chronic kidney disease stage IIIb Creatinine currently around baseline  Hyperlipidemia Continue atorvastatin  Hypothyroidism Continue levothyroxine  Nocturnal hypoxia Qualifies for nocturnal oxygen with overnight sleep study.  Results in paper chart  Type 2 diabetes mellitus Last hemoglobin A1c 7.7 Continue SSI, Accu-Cheks, hypoglycemic protocol  Phantom leg pain Holding gabapentin and Lyrica secondary to altered mental status  Weakness Physical therapy recommends rehab  Goals of care discussion DNR Palliative consulted       Code Status:     Code Status Orders  (From admission, onward)           Start     Ordered   04/14/21 1748  Do not attempt resuscitation (DNR)  Continuous       Question Answer  Comment  In the event of cardiac or respiratory ARREST Do not call a code blue   In the event of cardiac or respiratory ARREST Do not perform Intubation, CPR, defibrillation or ACLS   In the event of cardiac or respiratory ARREST Use medication by any route, position, wound care, and other measures to relive pain and suffering. May use oxygen, suction and manual treatment of airway obstruction as needed for comfort.      04/14/21 1748           Code Status History     Date Active  Date Inactive Code Status Order ID Comments User Context   10/22/2019 0837 10/25/2019 1757 DNR [id number]  Leonides Schanz Inpatient   10/03/2019 1720 10/22/2019 0837 Full Code [id number]  Leonides Schanz Inpatient   07/31/2019 1230 08/01/2019 2228 Full Code [id number]  Leonides Schanz Inpatient   03/01/2019 1604 03/02/2019 2136 Full Code [id number]  Katha Cabal, MD Inpatient   06/13/2018 1331 06/13/2018 1936 Full Code [id number]  Katha Cabal, MD Inpatient   08/12/2017 1158 08/12/2017 1700 Full Code [id number]  Schnier, Dolores Lory, MD Inpatient      Advance Directive Documentation    Flowsheet Row Most Recent Value  Type of Advance Directive Living will, Healthcare Power of Attorney, Out of facility DNR (pink MOST or yellow form)  Pre-existing out of facility DNR order (yellow form or pink MOST form) --  "MOST" Form in Place? --      Family Communication: Discussed with son at bedside Disposition Plan: Status is: Inpatient.  SNF   Alma Friendly, MD [facility]

## 2021-04-22 NOTE — Progress Notes (Signed)
PT Cancellation Note  Patient Details Name: [name] MRN: [id number] DOB: [birth year]   Cancelled Treatment:     PT attempt. Pt just finished eating lunch requesting to let food digest prior to PT session. She will be having a palliative care meeting later. Author will return after meeting is concluded. Pt is more alert now than previously observed by author a few days prior.     Willette Pa 04/22/2021, 1:39 PM

## 2021-04-22 NOTE — Progress Notes (Signed)
Occupational Therapy Treatment Patient Details Name: [name] MRN: [id number] DOB: 03-07-1926 Today's Date: 04/22/2021   History of present illness Pt is admitted for CVA, positive through L BG and occipital CVA. Pt is s/p TNK on 1/3. History includes R frontal/temporal craniotomy, Afib, R LE AKA, DM, and HTN.   OT comments  Pt seen for OT treatment on this date. Upon arrival to room, pt awake and sitting upright in bed with family at bedside. Pt with improved cognition this date - A&Ox3. Pt required only SUPERVISION for supine>sit transfer. Once seated EOB, pt maintained fair static sitting balance, requiring only set-up assist for seated oral care and UB dressing. While seated EOB, pt noted to have BM. Functional mobility portion of session coordinated with PT. Pt required MAX A for stand pivot transfer to [facility]. After passing BM, pt required MAXA+2 for sit>stand peri-care. Pt left sitting EOB with PT and pt is no acute distress. Pt is making good progress toward goals and continues to benefit from skilled OT services to maximize return to PLOF and minimize risk of future falls, injury, caregiver burden, and readmission. Will continue to follow POC. Discharge recommendation remains appropriate.     Recommendations for follow up therapy are one component of a multi-disciplinary discharge planning process, led by the attending physician.  Recommendations may be updated based on patient status, additional functional criteria and insurance authorization.    Follow Up Recommendations  Skilled nursing-short term rehab (<3 hours/day)    Assistance Recommended at Discharge Frequent or constant Supervision/Assistance  Patient can return home with the following  Two people to help with walking and/or transfers;Two people to help with bathing/dressing/bathroom;Assistance with cooking/housework;Assistance with feeding;Direct supervision/assist for medications management;Direct supervision/assist for  financial management;Assist for transportation;Help with stairs or ramp for entrance   Equipment Recommendations  Other (comment) (defer to next venue of care)       Precautions / Restrictions Precautions Precautions: Fall Restrictions Weight Bearing Restrictions: No Other Position/Activity Restrictions: old R AKA       Mobility Bed Mobility Overal bed mobility: Needs Assistance Bed Mobility: Supine to Sit     Supine to sit: Supervision;HOB elevated     General bed mobility comments: use of bed rails, extra time & cuing to scoot to sitting EOB    Transfers Overall transfer level: Needs assistance Equipment used: None Transfers: Bed to chair/wheelchair/BSC Sit to Stand: Mod assist Stand pivot transfers: Max assist               Balance Overall balance assessment: Needs assistance Sitting-balance support: Feet supported;No upper extremity supported Sitting balance-[name] Scale: Fair Sitting balance - Comments: Maintain balance sitting EOB during seated grooming tasks   Standing balance support: No upper extremity supported;During functional activity Standing balance-[name] Scale: Poor Standing balance comment: Requires MOD-MAX A for static standing balance                           ADL either performed or assessed with clinical judgement   ADL Overall ADL's : Needs assistance/impaired     Grooming: Wash/dry face;Oral care;Set up;Sitting Grooming Details (indicate cue type and reason): Able to maintain balance without support         Upper Body Dressing : Set up;Sitting Upper Body Dressing Details (indicate cue type and reason): to don/doff hospital gown     Toilet Transfer: Maximal assistance;Stand-pivot;BSC/3in1   Toileting- Clothing Manipulation and Hygiene: Maximal assistance;+2 for physical assistance;Sit to/from stand Toileting -  Clothing Manipulation Details (indicate cue type and reason): Requires MOD-MAX A for static standing balance  and MAX A for peri-care              Cognition Arousal/Alertness: Awake/alert Behavior During Therapy: Flat affect Overall Cognitive Status: History of cognitive impairments - at baseline                                 General Comments: Pt A&Ox3. Recalled children's name and pleasant and agreeable throughout                     Pertinent Vitals/ Pain       Pain Assessment: No/denies pain   Frequency  Min 3X/week        Progress Toward Goals  OT Goals(current goals can now be found in the care plan section)  Progress towards OT goals: Progressing toward goals  Acute Rehab OT Goals Patient Stated Goal: to be more consistently able to engage OT Goal Formulation: With family Time For Goal Achievement: 04/30/21 Potential to Achieve Goals: [place] Discharge plan remains appropriate;Frequency remains appropriate    Co-evaluation    PT/OT/SLP Co-Evaluation/Treatment: Yes Reason for Co-Treatment: For patient/therapist safety;To address functional/ADL transfers PT goals addressed during session: Mobility/safety with mobility;Balance OT goals addressed during session: ADL's and self-care      AM-PAC OT "6 Clicks" Daily Activity     Outcome Measure   Help from another person eating meals?: A Little Help from another person taking care of personal grooming?: A Little Help from another person toileting, which includes using toliet, bedpan, or urinal?: A Lot Help from another person bathing (including washing, rinsing, drying)?: A Lot Help from another person to put on and taking off regular upper body clothing?: A Little Help from another person to put on and taking off regular lower body clothing?: A Lot 6 Click Score: 15    End of Session    OT Visit Diagnosis: Unsteadiness on feet (R26.81);Muscle weakness (generalized) (M62.81)   Activity Tolerance Patient tolerated treatment well   Patient Left in bed;with call bell/phone within  reach;with bed alarm set;with family/visitor present;Other (comment) (with PT)   Nurse Communication Mobility status        Time: 7943-2761 OT Time Calculation (min): 27 min  Charges: OT General Charges $OT Visit: 1 Visit OT Treatments $Self Care/Home Management : 8-22 mins  Fredirick Maudlin, OTR/L [place]

## 2021-04-22 NOTE — Progress Notes (Signed)
Palliative Care Progress Note, Assessment & Plan   Patient Name: [name]       Date: 04/22/2021 DOB: 09-25-1925  Age: 88 y.o. MRN#: [id number] Attending Physician: Alma Friendly, MD Primary Care Physician: Sofie Hartigan, MD Admit Date: 04/14/2021  Reason for Consultation/Follow-up: Establishing goals of care  Subjective: Patient is lying in bed on her right side.  She is in no respiratory or apparent distress.  She does not open her eyes or acknowledge my presence throughout my visit.  Patient son [name] is at bedside.  HPI: 88 y.o. female  with past medical history of right AKA admitted on 04/14/2021 with facial droop and expressive aphasia. Code stroke was initiated and pt was given TNA.   Summary of counseling/coordination of care: After reviewing the patient's chart and assessing the patient, I spoke with the patient's son [name] at bedside.  He shares his concerns regarding patient's disposition.  He reiterates that his sister is the [place] and that he would like for me to speak with her as well.  [name] shares that he wants to do what is right by his mother and give her the best chance possible.  We reviewed that patient has been given opportunity to go to a rehab facility.  I shared that palliative medicine can continue to follow the patient during her time at rehab.  Discussed that this would be giving her the "best shot" and that if she is not able to participate or does not want to continue with rehab then both [name] and his sister will know that they have exhausted all options for their mother.  John again reiterated that he wants to do what ever is in his mother's best interest and that ultimately [name], patient's daughter, is in charge of making medical decisions.    I reviewed MOST form  and DNR status with John to ensure he understood the boundaries and the patient's wishes for medical treatment moving forward.  I spoke with [name] over the phone.  Reviewed patient's nutritional, functional, and cognitive status.  Discussed that functional abilities are a large part of determining patient's prognosis.   Therapeutic silence and active listening provided for her to share her thoughts and emotions regarding her mom's current health status.  She again shares that she feels emotionally exhausted and on a roller coaster of not knowing how to move forward with her mother's care.  I again reviewed the pros and cons of moving forward with hospice and rehab.    After weighing all options, [name] shares that she would like to have her mother attempt to rehab and then see where she stands after an attempt at rehabilitation.  [name] shares that she will speak with her brother about this in person this afternoon.  Then, she plans to contact TOC to let them know her final decision.  Palliative medicine will continue to follow the patient throughout her hospitalization.  Questions and concerns from family addressed.  Code Status: DNR  Prognosis: Unable to determine  Discharge Planning: [place] for rehab with Palliative care service follow-up  Recommendations/Plan: DNR Patient is already established with [facility] and they will continue to follow at  rehab  Care plan was discussed with patient, patient's son [name], patient's decision maker and dtr Tilda Burrow, Dr. Horris Latino  Physical Exam Vitals and nursing note reviewed.  Constitutional:      General: She is not in acute distress.    Appearance: She is not toxic-appearing.  HENT:     Head: Normocephalic.     Comments: Left eyelid droop    Mouth/Throat:     Mouth: Mucous membranes are moist.  Cardiovascular:     Rate and Rhythm: Normal rate.     Pulses: Normal pulses.  Pulmonary:     Effort:  Pulmonary effort is normal.  Abdominal:     Palpations: Abdomen is soft.  Musculoskeletal:     Comments: Generalized weakness  Skin:    General: Skin is warm and dry.  Neurological:     Comments: nonverbal            Palliative Assessment/Data: 50%    Total Time 25 minutes  Greater than 50%  of this time was spent counseling and coordinating care related to the above assessment and plan.  Thank you for allowing the Palliative Medicine Team to assist in the care of this patient.  Wagner Ilsa Iha, FNP-BC Palliative Medicine Team Team Phone # [phone]

## 2021-04-23 DIAGNOSIS — Z794 Long term (current) use of insulin: Secondary | ICD-10-CM

## 2021-04-23 DIAGNOSIS — E1151 Type 2 diabetes mellitus with diabetic peripheral angiopathy without gangrene: Secondary | ICD-10-CM | POA: Diagnosis not present

## 2021-04-23 DIAGNOSIS — G4734 Idiopathic sleep related nonobstructive alveolar hypoventilation: Secondary | ICD-10-CM | POA: Diagnosis present

## 2021-04-23 DIAGNOSIS — I63432 Cerebral infarction due to embolism of left posterior cerebral artery: Secondary | ICD-10-CM | POA: Diagnosis not present

## 2021-04-23 DIAGNOSIS — I160 Hypertensive urgency: Secondary | ICD-10-CM | POA: Diagnosis present

## 2021-04-23 DIAGNOSIS — N179 Acute kidney failure, unspecified: Secondary | ICD-10-CM | POA: Diagnosis not present

## 2021-04-23 DIAGNOSIS — G9341 Metabolic encephalopathy: Secondary | ICD-10-CM | POA: Diagnosis not present

## 2021-04-23 LAB — BASIC METABOLIC PANEL
Anion gap: 8 (ref 5–15)
BUN: 30 mg/dL — ABNORMAL HIGH (ref 8–23)
CO2: 25 mmol/L (ref 22–32)
Calcium: 8.8 mg/dL — ABNORMAL LOW (ref 8.9–10.3)
Chloride: 111 mmol/L (ref 98–111)
Creatinine, Ser: 1.65 mg/dL — ABNORMAL HIGH (ref 0.44–1.00)
GFR, Estimated: 29 mL/min — ABNORMAL LOW (ref >60)
Glucose, Bld: 158 mg/dL — ABNORMAL HIGH (ref 70–99)
Potassium: 4.2 mmol/L (ref 3.5–5.1)
Sodium: 144 mmol/L (ref 135–145)

## 2021-04-23 LAB — CBC WITH DIFFERENTIAL/PLATELET
Abs Immature Granulocytes: 0.03 10*3/uL (ref 0.00–0.07)
Basophils Absolute: 0 10*3/uL (ref 0.0–0.1)
Basophils Relative: 0 %
Eosinophils Absolute: 0.2 10*3/uL (ref 0.0–0.5)
Eosinophils Relative: 2 %
HCT: 40.1 % (ref 36.0–46.0)
Hemoglobin: 13 g/dL (ref 12.0–15.0)
Immature Granulocytes: 0 %
Lymphocytes Relative: 20 %
Lymphs Abs: 1.4 10*3/uL (ref 0.7–4.0)
MCH: 31.7 pg (ref 26.0–34.0)
MCHC: 32.4 g/dL (ref 30.0–36.0)
MCV: 97.8 fL (ref 80.0–100.0)
Monocytes Absolute: 1 10*3/uL (ref 0.1–1.0)
Monocytes Relative: 14 %
Neutro Abs: 4.4 10*3/uL (ref 1.7–7.7)
Neutrophils Relative %: 64 %
Platelets: 252 10*3/uL (ref 150–400)
RBC: 4.1 MIL/uL (ref 3.87–5.11)
RDW: 12.4 % (ref 11.5–15.5)
WBC: 7 10*3/uL (ref 4.0–10.5)
nRBC: 0 % (ref 0.0–0.2)

## 2021-04-23 LAB — GLUCOSE, CAPILLARY
Glucose-Capillary: 155 mg/dL — ABNORMAL HIGH (ref 70–99)
Glucose-Capillary: 149 mg/dL — ABNORMAL HIGH (ref 70–99)
Glucose-Capillary: 173 mg/dL — ABNORMAL HIGH (ref 70–99)
Glucose-Capillary: 164 mg/dL — ABNORMAL HIGH (ref 70–99)

## 2021-04-23 MED ORDER — HYDROCHLOROTHIAZIDE 12.5 MG PO TABS
12.5000 mg | ORAL_TABLET | Freq: Every day | ORAL | Status: DC
  Administered 2021-04-24: 09:00:00 12.5 mg via ORAL
  Filled 2021-04-23: qty 1

## 2021-04-23 MED ORDER — LORAZEPAM 0.5 MG PO TABS
0.5000 mg | ORAL_TABLET | Freq: Two times a day (BID) | ORAL | Status: DC | PRN
  Administered 2021-04-23: 0.5 mg via ORAL
  Filled 2021-04-23: qty 1

## 2021-04-23 NOTE — Progress Notes (Signed)
Patient ID: [name], female   DOB: 30-Oct-1925, 88 y.o.   MRN: [id number] [facility] PROGRESS NOTE  [name] ZHY:[id number] DOB: 1925/04/15 DOA: 04/14/2021 PCP: Sofie Hartigan, MD  HPI/Subjective: Brief history 88 year old female with past medical history of atrial fibrillation, not on AC, congestive heart failure, type 2 diabetes mellitus, essential hypertension.  She presented to the hospital with aphasia and was given TNKase for acute stroke.  MRI of the brain showed multifocal acute ischemic infarcts involving the left basal ganglia and posterior left parietal-occipital region.  Repeat CT scan on 04/17/2021 did not show any acute infarct.  Patient is still very weak and has fluctuating altered mental status.   Patient is sleepier today, had some anxiety this afternoon, but otherwise is interactive, without focal complaints, fever, respiratory distress, or vomiting.       Assessment/Plan:  Acute metabolic encephalopathy.   Continues to wax and wane. - Continue higher dose of Remeron  Embolic stroke with persistent atrial fibrillation.   Patient presented with aphasia and received TNKase.   -Continue aspirin and statin - Resume Eliquis in 2 weeks   Accelerated hypertension on presentation  BP normal -Continue amlodipine and HCTZ  Acute kidney injury on chronic kidney disease stage IIIb Creatinine currently around baseline  Hyperlipidemia -Continue atorvastatin glucose normal   Hypothyroidism -Continue levothyroxine  Nocturnal hypoxia Qualifies for nocturnal oxygen with overnight sleep study.  Results in paper chart  Type 2 diabetes mellitus Last hemoglobin A1c 7.7 - Continue sliding scale corrections  Phantom leg pain -hold gabapentin and Lyrica  secondary to altered mental status                  Objective: Vitals:   04/23/21 1143 04/23/21 1356  BP: (!) 141/67 (!) 121/57  Pulse: 69 74  Resp: (!) 28 (!) 24  Temp: 98.8 F (37.1 C)  98.4 F (36.9 C)  SpO2: 95% 97%    Intake/Output Summary (Last 24 hours) at 04/23/2021 1605 Last data filed at 04/23/2021 1549 Gross per 24 hour  Intake 0 ml  Output 500 ml  Net -500 ml   Filed Weights   04/19/21 0500 04/20/21 0500 04/20/21 0620  Weight: 57.4 kg 55.7 kg 57.6 kg     Exam: General appearance: Elderly adult female, lying in bed, sleeping, opens eyes to touch, then goes back to sleep.     HEENT: Edentulous, oropharynx moist, extraocular movements intact Cardiac: RRR, no murmurs, no lower extremity edema Respiratory: Normal respiratory rate and rhythm, lungs clear without rales or wheezes  Neuro: Moves upper extremities with generalized weakness but symmetric strength, speech fluent, mostly does not follow commands however Psych: Attention diminished    Scheduled Meds:  amLODipine  10 mg Oral Daily   aspirin  81 mg Oral Daily   atorvastatin  40 mg Oral Daily   cholecalciferol  1,000 Units Oral Daily   hydrALAZINE  25 mg Oral TID   insulin aspart  0-5 Units Subcutaneous QHS   insulin aspart  0-9 Units Subcutaneous TID WC   levothyroxine  88 mcg Oral QAC breakfast   lidocaine  1 patch Transdermal Q24H   mirtazapine  15 mg Oral QHS   multivitamin-lutein  1 capsule Oral QPM   polyethylene glycol  17 g Oral BID   sodium chloride flush  3 mL Intravenous Once         Code Status:     Code Status Orders  (From admission, onward)  Start     Ordered   04/14/21 1748  Do not attempt resuscitation (DNR)  Continuous       Question Answer Comment  In the event of cardiac or respiratory ARREST Do not call a code blue   In the event of cardiac or respiratory ARREST Do not perform Intubation, CPR, defibrillation or ACLS   In the event of cardiac or respiratory ARREST Use medication by any route, position, wound care, and other measures to relive pain and suffering. May use oxygen, suction and manual treatment of airway obstruction as needed for  comfort.      04/14/21 1748           Code Status History     Date Active Date Inactive Code Status Order ID Comments User Context   10/22/2019 0837 10/25/2019 1757 DNR [id number]  Leonides Schanz Inpatient   10/03/2019 1720 10/22/2019 0837 Full Code [id number]  Leonides Schanz Inpatient   07/31/2019 1230 08/01/2019 2228 Full Code [id number]  Leonides Schanz Inpatient   03/01/2019 1604 03/02/2019 2136 Full Code [id number]  Katha Cabal, MD Inpatient   06/13/2018 1331 06/13/2018 1936 Full Code [id number]  Katha Cabal, MD Inpatient   08/12/2017 1158 08/12/2017 1700 Full Code [id number]  Schnier, Dolores Lory, MD Inpatient      Advance Directive Documentation    Flowsheet Row Most Recent Value  Type of Advance Directive Living will, Healthcare Power of Attorney, Out of facility DNR (pink MOST or yellow form)  Pre-existing out of facility DNR order (yellow form or pink MOST form) --  "MOST" Form in Place? --      Family Communication: Discussed with son at bedside Disposition Plan: Status is: Inpatient.  SNF   Edwin Dada, MD [facility]

## 2021-04-23 NOTE — Assessment & Plan Note (Signed)
Qualifies for nocturnal oxygen with overnight sleep study.  Results in paper chart

## 2021-04-23 NOTE — Assessment & Plan Note (Addendum)
Patient admitted with acute aphasia, received TNKase as a CODE STROKE, MRI brain showed Patchy multifocal acute ischemic infarcts involving the left basal ganglia and posterior left parieto-occipital region, due to cardiogenic embolism from Afib.  Serial CT head afterwards was stable.  Neurology recommended new statin, also low dose aspirin for now, starting Eliquis at 2 weeks.  PT recommended SNF.

## 2021-04-23 NOTE — Progress Notes (Signed)
Occupational Therapy Treatment Patient Details Name: [name] MRN: [id number] DOB: 09/07/1925 Today's Date: 04/23/2021   History of present illness Pt is admitted for CVA, positive through L BG and occipital CVA. Pt is s/p TNK on 1/3. History includes R frontal/temporal craniotomy, Afib, R LE AKA, DM, and HTN.   OT comments  Chart reviewed, RN cleared pt for participation in OT tx session. Pt son present throughout tx session. Pt alert and oriented to self, grossly oriented to place/time. Tx session targeted increasing active participation in functional mobility tasks and ADL tasks. Pt with improved participation on this date and decreased assistance required for squat pivot transfer to the bedside chair (MIN-MOD A). Performs all grooming tasks seated with SET UP. Pt appears to be making slow progress towards goals, will continue to benefit from skilled OT to address functional deficits. Pt is left in bedside chair with family present, NAD, all needs met. RN aware of pt status. OT will continue to follow while admitted.    Recommendations for follow up therapy are one component of a multi-disciplinary discharge planning process, led by the attending physician.  Recommendations may be updated based on patient status, additional functional criteria and insurance authorization.    Follow Up Recommendations  Skilled nursing-short term rehab (<3 hours/day)    Assistance Recommended at Discharge Frequent or constant Supervision/Assistance  Patient can return home with the following  Two people to help with walking and/or transfers;Two people to help with bathing/dressing/bathroom;Assistance with cooking/housework;Assistance with feeding;Direct supervision/assist for medications management;Direct supervision/assist for financial management;Assist for transportation;Help with stairs or ramp for entrance   Equipment Recommendations  Other (comment) (per next venue of care)    Recommendations for  Other Services      Precautions / Restrictions Precautions Precautions: Fall       Mobility Bed Mobility Overal bed mobility: Needs Assistance Bed Mobility: Supine to Sit     Supine to sit: Min assist     General bed mobility comments: bed rails, extra time, step by step vcs    Transfers Overall transfer level: Needs assistance Equipment used: None Transfers: Sit to/from Stand;Bed to chair/wheelchair/BSC Sit to Stand: Min assist;From elevated surface     Squat pivot transfers: Min assist;Mod assist           Balance Overall balance assessment: Needs assistance Sitting-balance support: Feet supported;No upper extremity supported Sitting balance-[name] Scale: Good                                     ADL either performed or assessed with clinical judgement   ADL Overall ADL's : Needs assistance/impaired     Grooming: Wash/dry face;Oral care;Wash/dry hands;Sitting;Set up Grooming Details (indicate cue type and reason): in bedside chair                 Toilet Transfer: Minimal assistance;Moderate assistance;Stand-pivot Toilet Transfer Details (indicate cue type and reason): simulated to bedside chair         Functional mobility during ADLs: Minimal assistance;Moderate assistance      Extremity/Trunk Assessment              Vision       Perception     Praxis      Cognition Arousal/Alertness: Awake/alert Behavior During Therapy: Flat affect Overall Cognitive Status: History of cognitive impairments - at baseline  General Comments: pt is alert to self, grossly oriented to time/place, not oriented to situation          Exercises     Shoulder Instructions       General Comments      Pertinent Vitals/ Pain       Pain Assessment: No/denies pain  Home Living                                          Prior Functioning/Environment               Frequency  Min 3X/week        Progress Toward Goals  OT Goals(current goals can now be found in the care plan section)  Progress towards OT goals: Progressing toward goals     Plan Discharge plan remains appropriate;Frequency remains appropriate    Co-evaluation                 AM-PAC OT "6 Clicks" Daily Activity     Outcome Measure   Help from another person eating meals?: A Little Help from another person taking care of personal grooming?: A Little Help from another person toileting, which includes using toliet, bedpan, or urinal?: A Lot Help from another person bathing (including washing, rinsing, drying)?: A Lot Help from another person to put on and taking off regular upper body clothing?: A Little Help from another person to put on and taking off regular lower body clothing?: A Lot 6 Click Score: 15    End of Session Equipment Utilized During Treatment: Rolling walker (2 wheels)  OT Visit Diagnosis: Unsteadiness on feet (R26.81);Muscle weakness (generalized) (M62.81)   Activity Tolerance Patient tolerated treatment well   Patient Left in chair;with call bell/phone within reach;with chair alarm set;with family/visitor present   Nurse Communication Mobility status        Time: 1544-1610 OT Time Calculation (min): 26 min  Charges: OT General Charges $OT Visit: 1 Visit OT Treatments $Self Care/Home Management : 8-22 mins $Therapeutic Activity: 8-22 mins  Shanon Payor, OTD OTR/L  04/23/21, 5:09 PM

## 2021-04-23 NOTE — Plan of Care (Signed)
  Problem: Education: Goal: Knowledge of General Education information will improve Description: Including pain rating scale, medication(s)/side effects and non-pharmacologic comfort measures Outcome: Progressing   Problem: Health Behavior/Discharge Planning: Goal: Ability to manage health-related needs will improve Outcome: Progressing   Problem: Clinical Measurements: Goal: Ability to maintain clinical measurements within normal limits will improve Outcome: Progressing Goal: Will remain free from infection Outcome: Progressing Goal: Diagnostic test results will improve Outcome: Progressing Goal: Respiratory complications will improve Outcome: Progressing Goal: Cardiovascular complication will be avoided Outcome: Progressing   Problem: Activity: Goal: Risk for activity intolerance will decrease Outcome: Progressing   Problem: Nutrition: Goal: Adequate nutrition will be maintained Outcome: Progressing   Problem: Coping: Goal: Level of anxiety will decrease Outcome: Progressing   Problem: Elimination: Goal: Will not experience complications related to bowel motility Outcome: Progressing Goal: Will not experience complications related to urinary retention Outcome: Progressing   Problem: Pain Managment: Goal: General experience of comfort will improve Outcome: Progressing   Problem: Safety: Goal: Ability to remain free from injury will improve Outcome: Progressing   Problem: Skin Integrity: Goal: Risk for impaired skin integrity will decrease Outcome: Progressing   Problem: Education: Goal: Knowledge of disease or condition will improve Outcome: Progressing Goal: Knowledge of secondary prevention will improve (SELECT ALL) Outcome: Progressing Goal: Knowledge of patient specific risk factors will improve (INDIVIDUALIZE FOR PATIENT) Outcome: Progressing Goal: Individualized Educational Video(s) Outcome: Progressing   

## 2021-04-23 NOTE — Progress Notes (Signed)
PT Cancellation Note  Patient Details Name: [name] Mode MRN: [id number] DOB: 03/06/1926   Cancelled Treatment:     Author did return, per OT request, to assist pt back to bed. She performed stand pivot back to bed however was soiled from recent BM. RN tech made aware. This was a non billable PT session due to < 8 minutes total. PT will return tomorrow and continue to follow per current POC.    Willette Pa 04/23/2021, 5:45 PM

## 2021-04-23 NOTE — Progress Notes (Signed)
PT Cancellation Note  Patient Details Name: [name] MRN: [id number] DOB: 05-07-1925   Cancelled Treatment:     PT attempt. Pt is very lethargic again today and was unable to stay awake long enough to participate. Will return later this afternoon to see if pt is more appropriate. Pt has been very inconsistent throughout admission with alertness. When alert/awake, is very cooperative and pleasant.    Willette Pa 04/23/2021, 1:34 PM

## 2021-04-23 NOTE — Progress Notes (Addendum)
PT Cancellation Note  Patient Details Name: [name] MRN: [id number] DOB: 1925-08-02   Cancelled Treatment:     PT attempt, 2nd attempt today. Pt was currently being cleaned up from soiled bed. Author will return tomorrow and continue to follow per current POC.    Willette Pa 04/23/2021, 3:48 PM

## 2021-04-23 NOTE — Hospital Course (Signed)
Mrs. Ruff is a 88 y.o. F with paroxysmal atrial fibrillation, not on AC, dCHF DM and HTN who presented acute aphasia.   In the ER, CODE STROKE called and she was given tpA.  MRI of the brain showed multifocal acute ischemic infarcts involving the left basal ganglia and posterior left parietal-occipital region.    Repeat CT scan on 04/17/2021 did not show any acute infarct.

## 2021-04-24 DIAGNOSIS — E1151 Type 2 diabetes mellitus with diabetic peripheral angiopathy without gangrene: Secondary | ICD-10-CM | POA: Diagnosis not present

## 2021-04-24 DIAGNOSIS — I63432 Cerebral infarction due to embolism of left posterior cerebral artery: Secondary | ICD-10-CM | POA: Diagnosis not present

## 2021-04-24 DIAGNOSIS — N179 Acute kidney failure, unspecified: Secondary | ICD-10-CM | POA: Diagnosis not present

## 2021-04-24 DIAGNOSIS — G9341 Metabolic encephalopathy: Secondary | ICD-10-CM | POA: Diagnosis not present

## 2021-04-24 LAB — BASIC METABOLIC PANEL
Anion gap: 8 (ref 5–15)
BUN: 30 mg/dL — ABNORMAL HIGH (ref 8–23)
CO2: 25 mmol/L (ref 22–32)
Calcium: 8.9 mg/dL (ref 8.9–10.3)
Chloride: 108 mmol/L (ref 98–111)
Creatinine, Ser: 1.68 mg/dL — ABNORMAL HIGH (ref 0.44–1.00)
GFR, Estimated: 28 mL/min — ABNORMAL LOW (ref >60)
Glucose, Bld: 143 mg/dL — ABNORMAL HIGH (ref 70–99)
Potassium: 4.2 mmol/L (ref 3.5–5.1)
Sodium: 141 mmol/L (ref 135–145)

## 2021-04-24 LAB — CBC WITH DIFFERENTIAL/PLATELET
Abs Immature Granulocytes: 0.01 10*3/uL (ref 0.00–0.07)
Basophils Absolute: 0 10*3/uL (ref 0.0–0.1)
Basophils Relative: 1 %
Eosinophils Absolute: 0.2 10*3/uL (ref 0.0–0.5)
Eosinophils Relative: 3 %
HCT: 37.5 % (ref 36.0–46.0)
Hemoglobin: 12.7 g/dL (ref 12.0–15.0)
Immature Granulocytes: 0 %
Lymphocytes Relative: 18 %
Lymphs Abs: 1.2 10*3/uL (ref 0.7–4.0)
MCH: 32.1 pg (ref 26.0–34.0)
MCHC: 33.9 g/dL (ref 30.0–36.0)
MCV: 94.7 fL (ref 80.0–100.0)
Monocytes Absolute: 0.8 10*3/uL (ref 0.1–1.0)
Monocytes Relative: 13 %
Neutro Abs: 4.3 10*3/uL (ref 1.7–7.7)
Neutrophils Relative %: 65 %
Platelets: 255 10*3/uL (ref 150–400)
RBC: 3.96 MIL/uL (ref 3.87–5.11)
RDW: 12.5 % (ref 11.5–15.5)
WBC: 6.5 10*3/uL (ref 4.0–10.5)
nRBC: 0 % (ref 0.0–0.2)

## 2021-04-24 LAB — GLUCOSE, CAPILLARY
Glucose-Capillary: 134 mg/dL — ABNORMAL HIGH (ref 70–99)
Glucose-Capillary: 100 mg/dL — ABNORMAL HIGH (ref 70–99)
Glucose-Capillary: 204 mg/dL — ABNORMAL HIGH (ref 70–99)
Glucose-Capillary: 190 mg/dL — ABNORMAL HIGH (ref 70–99)

## 2021-04-24 LAB — RESP PANEL BY RT-PCR (FLU A&B, COVID) ARPGX2
Influenza A by PCR: NEGATIVE
Influenza B by PCR: NEGATIVE
SARS Coronavirus 2 by RT PCR: NEGATIVE

## 2021-04-24 NOTE — Assessment & Plan Note (Addendum)
On admission, patient was confused, she had reduced awareness during her hospital stay, due to stroke.  This resolved and she is more alert and oriented to self and family at discharge.

## 2021-04-24 NOTE — Assessment & Plan Note (Addendum)
Cr 1.9 up to 2.0 on admission, improved to baseline 1.5-1.6 with treatment.

## 2021-04-24 NOTE — Progress Notes (Signed)
°  Progress Note   Patient: [name] XIP:[id number] DOB: 11-03-1925 DOA: 04/14/2021     10 DOS: the patient was seen and examined on 04/24/2021      Brief hospital course: Mrs. Roye is a 88 y.o. F with paroxysmal atrial fibrillation, not on AC, dCHF DM and HTN who presented acute aphasia.   In the ER, CODE STROKE called and she was given tpA.  MRI of the brain showed multifocal acute ischemic infarcts involving the left basal ganglia and posterior left parietal-occipital region.    Repeat CT scan on 04/17/2021 did not show any acute infarct.          Assessment and Plan * Stroke [facility])- (present on admission) - aspirin 81 mg daily, then start Eliquis after 2 weeks. - atorvastatin 40 mg daily     Acute metabolic encephalopathy- (present on admission) This has resolved.  Primary hypertension- (present on admission) Blood pressure slightly elevated today - Continue amlodipine, HCTZ  Hyperlipidemia associated with type 2 diabetes mellitus ([place])- (present on admission) - Continue atorvastatin  Diabetes (H[facility]) Glucose controlled - Continue sliding scale corrections - Hold gabapentin  Hypertensive urgency- (present on admission) Blood pressure improved from previous  Acute kidney injury superimposed on CKD ([place])- (present on admission) Resolved  Stage 3b chronic kidney disease (CKD) ([place])- (present on admission) Creatinine stable relative to baseline - Renally dose meds  PAD (peripheral artery disease) ([place])- (present on admission) - Continue aspirin and statin  Nocturnal hypoxemia- (present on admission) Qualifies for nocturnal oxygen with overnight sleep study.  Results in paper chart  Hypothyroidism- (present on admission) - Continue levothyroxine     Subjective: Feeling generalized malaise.  No fever, no chest pain, no new focal symptoms.  Objective Signs reviewed and remarkable for mild hypertension, otherwise normal General appearance: Adult female, lying  in bed, makes eye contact, appears tired, chronically ill       Cardiac: RRR, loud systolic murmur, no lower extremity edema on the existing leg. Respiratory: Normal respiratory rate and rhythm, lungs clear without rales or wheezes Abdomen: Abdomen soft without tenderness palpation or guarding MSK: Above-knee amputation Neuro: Moves upper extremities with generalized weakness but symmetric strength, speech fluent, face symmetric Psych: Attention diminished, affect blunted   Data Reviewed:  Glucose normal  Family Communication:   Disposition: Status is: Inpatient  Remains inpatient appropriate because: Patient has had a large stroke, she will require significant rehab, placement is pending.  She is medically stable for discharge to rehab when bed available, likely tomorrow           Author: Edwin Dada, MD 04/24/2021 3:56 PM  For on call review [email or web address].

## 2021-04-24 NOTE — Assessment & Plan Note (Deleted)
Glucose controlled - Continue sliding scale corrections - Hold gabapentin

## 2021-04-24 NOTE — Plan of Care (Signed)
  Problem: Education: Goal: Knowledge of General Education information will improve Description: Including pain rating scale, medication(s)/side effects and non-pharmacologic comfort measures Outcome: Progressing   Problem: Health Behavior/Discharge Planning: Goal: Ability to manage health-related needs will improve Outcome: Progressing   Problem: Clinical Measurements: Goal: Ability to maintain clinical measurements within normal limits will improve Outcome: Progressing Goal: Will remain free from infection Outcome: Progressing Goal: Diagnostic test results will improve Outcome: Progressing Goal: Respiratory complications will improve Outcome: Progressing Goal: Cardiovascular complication will be avoided Outcome: Progressing   Problem: Activity: Goal: Risk for activity intolerance will decrease Outcome: Progressing   Problem: Nutrition: Goal: Adequate nutrition will be maintained Outcome: Progressing   Problem: Coping: Goal: Level of anxiety will decrease Outcome: Progressing   Problem: Elimination: Goal: Will not experience complications related to bowel motility Outcome: Progressing Goal: Will not experience complications related to urinary retention Outcome: Progressing   Problem: Pain Managment: Goal: General experience of comfort will improve Outcome: Progressing   Problem: Safety: Goal: Ability to remain free from injury will improve Outcome: Progressing   Problem: Skin Integrity: Goal: Risk for impaired skin integrity will decrease Outcome: Progressing   Problem: Education: Goal: Knowledge of disease or condition will improve Outcome: Progressing Goal: Knowledge of secondary prevention will improve (SELECT ALL) Outcome: Progressing Goal: Knowledge of patient specific risk factors will improve (INDIVIDUALIZE FOR PATIENT) Outcome: Progressing Goal: Individualized Educational Video(s) Outcome: Progressing   

## 2021-04-24 NOTE — TOC Progression Note (Signed)
Transition of Care [facility]) - Progression Note    Patient Details  Name: [name] MRN: [id number] Date of Birth: 01/03/1926  Transition of Care [facility]) CM/SW [place], RN Phone Number: 04/24/2021, 2:11 PM  Clinical Narrative:   Patient has authorization to go to skilled nursing facility tomorrow, [facility] [id number], approved 13-28 Apr 2021.  As per Sharyn Lull at [facility] is able to take patient tomorrow, Saturday 25 Apr 2021.  Please call Sharyn Lull at [phone] to schedule.  TOC to follow.    Expected Discharge Plan:  (TBD) Barriers to Discharge: Continued Medical Work up  Expected Discharge Plan and Services Expected Discharge Plan:  (TBD)       Living arrangements for the past 2 months: Single Family Home                                       Social Determinants of Health (SDOH) Interventions    Readmission Risk Interventions No flowsheet data found.

## 2021-04-24 NOTE — Progress Notes (Signed)
Physical Therapy Treatment Patient Details Name: [name] MRN: [id number] DOB: May 14, 1925 Today's Date: 04/24/2021   History of Present Illness Pt is admitted for CVA, positive through L BG and occipital CVA. Pt is s/p TNK on 1/3. History includes R frontal/temporal craniotomy, Afib, R LE AKA, DM, and HTN.    PT Comments    Pt was supine in bed upon arriving. She agrees to session and is much more alert than she has been this admission. She was able to achieve EOB short sit with increased time and vcs for technique improvements. She stood 2 x but was limited by BMs. Returned pt to supine in bed and repositioned to [facility]. Overall pt is progressing. Will continue to benefit from SNF at [facility] for continued skilled PT.    Recommendations for follow up therapy are one component of a multi-disciplinary discharge planning process, led by the attending physician.  Recommendations may be updated based on patient status, additional functional criteria and insurance authorization.  Follow Up Recommendations  Skilled nursing-short term rehab (<3 hours/day)     Assistance Recommended at Discharge Frequent or constant Supervision/Assistance  Patient can return home with the following A lot of help with walking and/or transfers;A lot of help with bathing/dressing/bathroom;Assistance with cooking/housework;Assist for transportation;Assistance with feeding;Help with stairs or ramp for entrance   Equipment Recommendations  Other (comment);BSC/3in1       Precautions / Restrictions Precautions Precautions: Fall Restrictions Weight Bearing Restrictions: Yes RLE Weight Bearing: Weight bearing as tolerated     Mobility  Bed Mobility Overal bed mobility: Needs Assistance Bed Mobility: Supine to Sit     Supine to sit: Min assist Sit to supine: Min assist   General bed mobility comments: bed rails, extra time, step by step vcs    Transfers Overall transfer level: Needs assistance Equipment used:  None Transfers: Sit to/from Stand Sit to Stand: Min assist;From elevated surface           General transfer comment: pt stood several times EOB to be cleaned after having BM. She does fatigue quickly howveer overall tolerated session well.    Ambulation/Gait      General Gait Details: unable    Balance Overall balance assessment: Needs assistance Sitting-balance support: Feet supported;No upper extremity supported Sitting balance-[name] Scale: Good     Standing balance support: During functional activity;Bilateral upper extremity supported Standing balance-[name] Scale: Poor Standing balance comment: Pt was able to static stand holding onto RW long enough to be cleaned from BM. High fall risk overall     Cognition Arousal/Alertness: Awake/alert Behavior During Therapy: Flat affect Overall Cognitive Status: History of cognitive impairments - at baseline    General Comments: Pt is much more alert than previously observed this admission. She follows simple commands consisently throughout.               Pertinent Vitals/Pain Pain Assessment: No/denies pain Pain Score: 0-No pain Faces Pain Scale: No hurt Pain Location: stomach Pain Intervention(s): Limited activity within patient's tolerance;Monitored during session;Premedicated before session;Repositioned;Ice applied     PT Goals (current goals can now be found in the care plan section) Acute Rehab PT Goals Patient Stated Goal: none stated Progress towards PT goals: Progressing toward goals    Frequency    7X/week      PT Plan Current plan remains appropriate    Co-evaluation     PT goals addressed during session: Mobility/safety with mobility;Balance;Proper use of DME;Strengthening/ROM        AM-PAC PT "  6 Clicks" Mobility   Outcome Measure  Help needed turning from your back to your side while in a flat bed without using bedrails?: A Little Help needed moving from lying on your back to sitting on  the side of a flat bed without using bedrails?: A Little Help needed moving to and from a bed to a chair (including a wheelchair)?: A Lot Help needed standing up from a chair using your arms (e.g., wheelchair or bedside chair)?: A Lot Help needed to walk in hospital room?: Total Help needed climbing 3-5 steps with a railing? : Total 6 Click Score: 12    End of Session Equipment Utilized During Treatment: Gait belt Activity Tolerance: Patient tolerated treatment well Patient left: in bed;with call bell/phone within reach;with bed alarm set;with family/visitor present;Other (comment) Nurse Communication: Mobility status PT Visit Diagnosis: Unsteadiness on feet (R26.81);Muscle weakness (generalized) (M62.81);Other abnormalities of gait and mobility (R26.89)     Time: 2883-3744 PT Time Calculation (min) (ACUTE ONLY): 29 min  Charges:  $Gait Training: 8-22 mins $Therapeutic Activity: 8-22 mins                    Julaine Fusi PTA 04/24/21, 4:35 PM

## 2021-04-24 NOTE — Care Management Important Message (Signed)
Important Message  Patient Details  Name: [name] MRN: [id number] Date of Birth: November 30, 1925   Medicare Important Message Given:  Yes     [name] 04/24/2021, 11:45 AM

## 2021-04-24 NOTE — Assessment & Plan Note (Addendum)
BP elevated on admission.  Discharged on new amlodipine, continued Coreg and lisinopril.

## 2021-04-25 DIAGNOSIS — N179 Acute kidney failure, unspecified: Secondary | ICD-10-CM | POA: Diagnosis not present

## 2021-04-25 DIAGNOSIS — I160 Hypertensive urgency: Secondary | ICD-10-CM

## 2021-04-25 DIAGNOSIS — E1169 Type 2 diabetes mellitus with other specified complication: Secondary | ICD-10-CM | POA: Diagnosis not present

## 2021-04-25 DIAGNOSIS — I63432 Cerebral infarction due to embolism of left posterior cerebral artery: Secondary | ICD-10-CM | POA: Diagnosis not present

## 2021-04-25 DIAGNOSIS — G9341 Metabolic encephalopathy: Secondary | ICD-10-CM | POA: Diagnosis not present

## 2021-04-25 LAB — CBC WITH DIFFERENTIAL/PLATELET
Abs Immature Granulocytes: 0.04 10*3/uL (ref 0.00–0.07)
Basophils Absolute: 0 10*3/uL (ref 0.0–0.1)
Basophils Relative: 1 %
Eosinophils Absolute: 0.2 10*3/uL (ref 0.0–0.5)
Eosinophils Relative: 3 %
HCT: 41.3 % (ref 36.0–46.0)
Hemoglobin: 13.8 g/dL (ref 12.0–15.0)
Immature Granulocytes: 1 %
Lymphocytes Relative: 17 %
Lymphs Abs: 1.4 10*3/uL (ref 0.7–4.0)
MCH: 32.2 pg (ref 26.0–34.0)
MCHC: 33.4 g/dL (ref 30.0–36.0)
MCV: 96.5 fL (ref 80.0–100.0)
Monocytes Absolute: 0.9 10*3/uL (ref 0.1–1.0)
Monocytes Relative: 12 %
Neutro Abs: 5.2 10*3/uL (ref 1.7–7.7)
Neutrophils Relative %: 66 %
Platelets: 280 10*3/uL (ref 150–400)
RBC: 4.28 MIL/uL (ref 3.87–5.11)
RDW: 12.1 % (ref 11.5–15.5)
WBC: 7.8 10*3/uL (ref 4.0–10.5)
nRBC: 0 % (ref 0.0–0.2)

## 2021-04-25 LAB — BASIC METABOLIC PANEL
Anion gap: 12 (ref 5–15)
BUN: 29 mg/dL — ABNORMAL HIGH (ref 8–23)
CO2: 23 mmol/L (ref 22–32)
Calcium: 9.1 mg/dL (ref 8.9–10.3)
Chloride: 104 mmol/L (ref 98–111)
Creatinine, Ser: 1.6 mg/dL — ABNORMAL HIGH (ref 0.44–1.00)
GFR, Estimated: 30 mL/min — ABNORMAL LOW (ref >60)
Glucose, Bld: 149 mg/dL — ABNORMAL HIGH (ref 70–99)
Potassium: 4.3 mmol/L (ref 3.5–5.1)
Sodium: 139 mmol/L (ref 135–145)

## 2021-04-25 LAB — GLUCOSE, CAPILLARY
Glucose-Capillary: 144 mg/dL — ABNORMAL HIGH (ref 70–99)
Glucose-Capillary: 300 mg/dL — ABNORMAL HIGH (ref 70–99)

## 2021-04-25 MED ORDER — HYDRALAZINE HCL 50 MG PO TABS
25.0000 mg | ORAL_TABLET | Freq: Three times a day (TID) | ORAL | Status: DC
  Administered 2021-04-25 (×2): 25 mg via ORAL
  Filled 2021-04-25: qty 1

## 2021-04-25 MED ORDER — AMLODIPINE BESYLATE 10 MG PO TABS: 10.0000 mg | ORAL_TABLET | Freq: Every day | ORAL | Status: DC

## 2021-04-25 MED ORDER — ATORVASTATIN CALCIUM 40 MG PO TABS: 40.0000 mg | ORAL_TABLET | Freq: Every day | ORAL | Status: AC

## 2021-04-25 MED ORDER — HUMALOG KWIKPEN 100 UNIT/ML ~~LOC~~ SOPN: 3.0000 [IU] | PEN_INJECTOR | Freq: Three times a day (TID) | SUBCUTANEOUS | 11 refills | Status: AC

## 2021-04-25 NOTE — Progress Notes (Signed)
EMS has been called, patient is 4th on list for transport today. I tried to call report to [facility]. Call was disconnected. I tried to cal a 2nd time and left message with name, patient name coming, and phone number to unit 1c here at [facility].

## 2021-04-25 NOTE — Discharge Summary (Signed)
Physician Discharge Summary   Patient: [name] MRN: [id number] DOB: March 06, 1926  Admit date:     04/14/2021  Discharge date: 04/25/21  Discharge Physician: Edwin Dada   PCP: Sofie Hartigan, MD   Recommendations at discharge:   Follow up with PCP 1 week after discharge from SNF Follow up with Neurology, Dr. Manuella Ghazi in 4-6 weeks SNF: Please start Eliquis on 05/01/21 and STOP low dose aspirin at that time SNF: Please check BP daily and adjust Coreg and lisinopril as needed SNF: Please check BMP in 1 week to evaluate serum creatinine, Cr on discharge 1.6 mg/dL SNF: Please provide nocturnal supplemental oxygen       Discharge Diagnoses Principal Problem:   Stroke [facility]) Active Problems:   Primary hypertension   Acute metabolic encephalopathy   Type 2 diabetes mellitus with stage 4 chronic kidney disease, without long-term current use of insulin (HCC)   Hyperlipidemia associated with type 2 diabetes mellitus (HCC)   Stage 3b chronic kidney disease (CKD) ([place])   Acute kidney injury superimposed on CKD ([place])   Hypertensive urgency   PAD (peripheral artery disease) (HCC)   Phantom pain after amputation of lower extremity (HCC)   Nocturnal hypoxemia   Hypothyroidism     Hospital Course   [name] is a 88 y.o. F with paroxysmal atrial fibrillation, not on AC, dCHF DM and HTN who presented acute aphasia.   In the ER, CODE STROKE called and she was given tpA.  MRI of the brain showed multifocal acute ischemic infarcts involving the left basal ganglia and posterior left parietal-occipital region.    Repeat CT scan on 04/17/2021 did not show any acute infarct.         * Stroke [facility])- (present on admission) Patient admitted with acute aphasia, received TNKase as a CODE STROKE, MRI brain showed Patchy multifocal acute ischemic infarcts involving the left basal ganglia and posterior left parieto-occipital region, due to cardiogenic embolism from Afib.  Serial CT head  afterwards was stable.  Neurology recommended new statin, also low dose aspirin for now, starting Eliquis at 2 weeks.  PT recommended SNF.    Acute metabolic encephalopathy- (present on admission) On admission, patient was confused, she had reduced awareness during her hospital stay, due to stroke.  This resolved and she is more alert and oriented to self and family at discharge.  Primary hypertension- (present on admission) BP elevated on admission.  Discharged on new amlodipine, continued Coreg and lisinopril.  Hyperlipidemia associated with type 2 diabetes mellitus ([place])- (present on admission)    Hypertensive urgency- (present on admission)    Acute kidney injury superimposed on CKD ([place])- (present on admission) Cr 1.9 up to 2.0 on admission, improved to baseline 1.5-1.6 with treatment.  Stage 3b chronic kidney disease (CKD) ([place])- (present on admission)    PAD (peripheral artery disease) ([place])- (present on admission)    Nocturnal hypoxemia- (present on admission) Qualifies for nocturnal oxygen with overnight sleep study.  Results in paper chart  Phantom pain after amputation of lower extremity ([place])- (present on admission)    Hypothyroidism- (present on admission)          Consultants: Neurology Procedures performed: Thrombolytics, MRI brain, CT brain  Disposition: Skilled nursing facility Diet recommendation: Mechanical soft Thin liquids VIA CUP -- no straws. General aspiration precautions; Pills Whole in Puree for ease and safety of swallowing; Tray setup and positioning assistance for meals. Feeding assistance to support     DISCHARGE MEDICATION: Allergies as  of 04/25/2021       Reactions   Sulfa Antibiotics Hives        Medication List     STOP taking these medications    clopidogrel 75 MG tablet Commonly known as: PLAVIX   furosemide 20 MG tablet Commonly known as: LASIX   gabapentin 100 MG capsule Commonly known as: NEURONTIN   Levemir  FlexTouch 100 UNIT/ML FlexPen Generic drug: insulin detemir   lovastatin 20 MG tablet Commonly known as: MEVACOR   Lyrica 50 MG capsule Generic drug: pregabalin   meclizine 25 MG tablet Commonly known as: ANTIVERT   potassium chloride SA 20 MEQ tablet Commonly known as: KLOR-CON M       TAKE these medications    Accu-Chek Aviva Plus test strip Generic drug: glucose blood What changed: Another medication with the same name was removed. Continue taking this medication, and follow the directions you see here.   acetaminophen 325 MG tablet Commonly known as: TYLENOL Take 325-650 mg by mouth every 6 (six) hours as needed for headache.   amLODipine 10 MG tablet Commonly known as: NORVASC Take 1 tablet (10 mg total) by mouth daily. Start taking on: April 26, 2021   aspirin 81 MG EC tablet Take 1 tablet (81 mg total) by mouth daily.   atorvastatin 40 MG tablet Commonly known as: LIPITOR Take 1 tablet (40 mg total) by mouth daily. Start taking on: April 26, 2021   calcium carbonate 1500 (600 Ca) MG Tabs tablet Commonly known as: OSCAL Take by mouth daily with breakfast.   carvedilol 3.125 MG tablet Commonly known as: COREG Take 3.125 mg by mouth 2 (two) times daily.   HumaLOG KwikPen 100 UNIT/ML KwikPen Generic drug: insulin lispro Inject 3 Units into the skin 3 (three) times daily after meals. Hold if eats <50% of meal What changed: additional instructions   levothyroxine 88 MCG tablet Commonly known as: SYNTHROID Take 88 mcg by mouth daily before breakfast.   lisinopril 5 MG tablet Commonly known as: ZESTRIL Take 5 mg by mouth daily.   mirtazapine 15 MG tablet Commonly known as: REMERON Take by mouth.   PRESERVISION AREDS 2 PO Take 1 tablet by mouth every evening.   Vitamin D3 25 MCG tablet Commonly known as: Vitamin D Take 1,000 Units by mouth daily.        Contact information for follow-up providers     Vladimir Crofts, MD. Schedule an  appointment as soon as possible for a visit in 1 month(s).   Specialty: Neurology Contact information: [facility] [place] [address] [phone]         Sofie Hartigan, MD Follow up.   Specialty: Family Medicine Why: make an appt one week after discharge from snf Contact information: [place] [address] [phone]              Contact information for after-discharge care     Destination     [facility] .   Service: Skilled Nursing Contact information: [address] [place] [phone]                    Discharge Instructions     Diet - low sodium heart healthy   Complete by: As directed    Increase activity slowly   Complete by: As directed         Discharge Exam: Filed Weights   04/20/21 [phone]  04/24/21 0500 04/25/21 0500  Weight: 57.6 kg 58.2 kg 57.5 kg   General: Pt is  awake, not in acute distress, lying in bed, appears tired Cardiovascular: RRR, nl W0-J8, loud systolic murmur appreciated.   No LE edema in existing leg.   Respiratory: Normal respiratory rate and rhythm.  CTAB without rales or wheezes. Abdominal: Abdomen soft and non-tender.  No distension or HSM.   Neuro/Psych: Strength symmetric in upper extremities.  Judgment and insight appear diminished due to dementia   Condition at discharge: stable  The results of significant diagnostics from this hospitalization (including imaging, microbiology, ancillary and laboratory) are listed below for reference.   Imaging Studies: CT HEAD WO CONTRAST (5MM)  Result Date: 04/17/2021 .  CLINICAL DATA: Mental status change, unknown cause recent stoke. EXAM: CT HEAD WITHOUT CONTRAST TECHNIQUE: Contiguous axial images were obtained from the base of the skull through the vertex without intravenous contrast. COMPARISON:  Head CT 04/15/2021 and MRI 04/14/2021 FINDINGS: Brain:  Hypodensities in the left basal ganglia and left corona radiata correspond to known acute infarcts. Additional small acute cortical and subcortical infarcts elsewhere in the left cerebral hemisphere on the prior MRI are not well seen by CT. No definite new infarct, intracranial hemorrhage, mass, midline shift, or extra-axial fluid collection is identified. The ventricles and sulci are within normal limits for age. Patchy hypodensities in the cerebral white matter bilaterally are unchanged and nonspecific but compatible with mild-to-moderate chronic small vessel ischemic disease. Vascular: Calcified atherosclerosis at the skull base. No hyperdense vessel. Skull: Right-sided craniotomy. Sinuses/Orbits: Chronic right sphenoid sinusitis. Clear mastoid air cells. Bilateral cataract extraction. Other: None. IMPRESSION: 1. Known recent left basal ganglia region infarcts. 2. No evidence of new intracranial abnormality. 3. Mild-to-moderate chronic small vessel ischemic disease. Electronically Signed   By: Logan Bores M.D.   On: 04/17/2021 13:45   CT HEAD WO CONTRAST (5MM)  Result Date: 04/15/2021 CLINICAL DATA:  Stroke follow-up EXAM: CT HEAD WITHOUT CONTRAST TECHNIQUE: Contiguous axial images were obtained from the base of the skull through the vertex without intravenous contrast. COMPARISON:  CT and MRI examination dated April 14, 2021 FINDINGS: Brain: Hypodensity in the left basal ganglia consistent with recent infarction is more conspicuous on this examination. Advanced chronic microvascular ischemic changes of the periventricular white matter. No acute intracranial hemorrhage, mass effect or midline shift. Vascular: No hyperdense vessel or unexpected calcification. Skull: Postsurgical changes from prior right frontal/parietal craniectomy. Sinuses/Orbits: Chronic changes of the right maxillary sinus. Other: None. IMPRESSION: 1. No acute intracranial hemorrhage. Recent infarction of the left basal ganglia is more  conspicuous on this examination. 2. Cerebral atrophy and chronic microvascular ischemic changes of the white matter. 3. Postsurgical changes for right frontal/parietal craniectomy as well as right maxillary sinus. Electronically Signed   By: Keane Police D.O.   On: 04/15/2021 09:28   CT HEAD WO CONTRAST (5MM)  Result Date: 04/14/2021 CLINICAL DATA:  Mental status change. Right-sided facial droop with aphasia and atrial fibrillation. Patient underwent CT angiography of the head and neck earlier today. EXAM: CT HEAD WITHOUT CONTRAST TECHNIQUE: Contiguous axial images were obtained from the base of the skull through the vertex without intravenous contrast. COMPARISON:  Prior studies earlier today and 02/04/2021. FINDINGS: Brain: There is intravenous contrast related to the preceding CTA. There is no evidence of acute intracranial hemorrhage, mass lesion, brain edema or extra-axial fluid collection. Stable atrophy and chronic small vessel ischemic changes in the periventricular white matter. There is no CT evidence of  acute cortical infarction. Vascular: Intracranial vascular calcifications and contrast. Skull: Stable sequela of previous right frontotemporal craniotomy. No acute calvarial findings. Sinuses/Orbits: Chronic opacification of the right maxillary sinus. The additional visualized paranasal sinuses, mastoid air cells and middle ears are clear. Previous bilateral lens surgery. Other: None. IMPRESSION: 1. Aside from the presence of residual intravascular contrast from the preceding CTA, no significant changes seen from the earlier study. No evidence of acute intracranial hemorrhage or infarct. 2. Atrophy and chronic small vessel ischemic changes. Postsurgical changes on the right and chronic right maxillary sinus disease. Electronically Signed   By: Richardean Sale M.D.   On: 04/14/2021 17:17   MR BRAIN WO CONTRAST  Result Date: 04/14/2021 CLINICAL DATA:  Initial evaluation for acute altered mental status.  EXAM: MRI HEAD WITHOUT CONTRAST TECHNIQUE: Multiplanar, multiecho pulse sequences of the brain and surrounding structures were obtained without intravenous contrast. COMPARISON:  Head CT from earlier the same day. FINDINGS: Brain: Cerebral volume within normal limits for age. Patchy T2/FLAIR hyperintensity involving the periventricular and deep white matter both cerebral hemispheres most consistent with chronic small vessel ischemic disease, mild for age. Mild patchy involvement of the pons noted. Few small remote bilateral cerebellar infarcts noted. Patchy multifocal areas of restricted diffusion seen involving the left basal ganglia, with involvement of the left caudate and lentiform nuclei. Additional patchy cortical and subcortical areas of diffusion abnormality seen involving the posterior left parieto-occipital region. Findings consistent with acute ischemic infarcts. No associated hemorrhage or mass effect. No other evidence for acute or subacute ischemia. Gray-white matter differentiation otherwise maintained. No other areas of remote cortical infarction. No acute intracranial hemorrhage. No mass lesion, midline shift or mass effect. No hydrocephalus or extra-axial fluid collection. Pituitary gland suprasellar region within normal limits. Midline structures intact. Vascular: Major intracranial vascular flow voids are maintained. Skull and upper cervical spine: Craniocervical junction within normal limits. Bone marrow signal intensity normal. Prior right-sided craniotomy noted. No acute scalp soft tissue abnormality. Sinuses/Orbits: Prior bilateral ocular lens replacement. Globes and orbital soft tissues demonstrate no acute finding. Scattered mucosal thickening noted within the ethmoidal air cells and maxillary sinuses. Paranasal sinuses are otherwise clear. No mastoid effusion. Other: None. IMPRESSION: 1. Patchy multifocal acute ischemic infarcts involving the left basal ganglia and posterior left  parieto-occipital region as above. No associated hemorrhage or mass effect. 2. Underlying age-related cerebral atrophy with mild chronic small vessel ischemic disease. Electronically Signed   By: Jeannine Boga M.D.   On: 04/14/2021 22:51   DG Chest Port 1 View  Result Date: 04/16/2021 CLINICAL DATA:  Short of breath EXAM: PORTABLE CHEST 1 VIEW COMPARISON:  02/23/2020 FINDINGS: Heart size upper normal. Vascularity normal. Lungs clear without infiltrate or effusion. IMPRESSION: No active disease. Electronically Signed   By: Franchot Gallo M.D.   On: 04/16/2021 13:14   DG Abd Portable 1V  Result Date: 04/22/2021 CLINICAL DATA:  Constipation EXAM: PORTABLE ABDOMEN - 1 VIEW COMPARISON:  None. FINDINGS: Lung bases are clear. Nonobstructed gas pattern with moderate stool burden. Aortic atherosclerosis. Stent in the right groin. Phleboliths in the pelvis. IMPRESSION: Nonobstructed gas pattern with moderate stool burden. Electronically Signed   By: Donavan Foil M.D.   On: 04/22/2021 16:11   ECHOCARDIOGRAM COMPLETE  Result Date: 04/15/2021    ECHOCARDIOGRAM REPORT   Patient Name:   ALAYZIA PAVLOCK Berch Date of Exam: 04/15/2021 Medical Rec #:  [id number]    Height:       65.0 in Accession #:  [id number]   Weight:       137.0 lb Date of Birth:  12-14-1925    BSA:          1.684 m Patient Age:    44 years     BP:           154/66 mmHg Patient Gender: F            HR:           47 bpm. Exam Location:  [facility] Procedure: 2D Echo, Color Doppler and Cardiac Doppler Indications:     I63.9 Stroke  History:         Patient has no prior history of Echocardiogram examinations.                  CHF; Risk Factors:Hypertension and Diabetes.  Sonographer:     Charmayne Sheer Referring Phys:  [id number] Tonye Royalty Diagnosing Phys: Isaias Cowman MD  Sonographer Comments: Suboptimal apical window. IMPRESSIONS  1. Left ventricular ejection fraction, by estimation, is 50 to 55%. The left ventricle has low normal function. The left  ventricle has no regional wall motion abnormalities. Left ventricular diastolic parameters are indeterminate.  2. Right ventricular systolic function is normal. The right ventricular size is normal.  3. The mitral valve is normal in structure. Mild mitral valve regurgitation. No evidence of mitral stenosis.  4. The aortic valve is normal in structure. Aortic valve regurgitation is not visualized. Mild to moderate aortic valve stenosis.  5. The inferior vena cava is normal in size with greater than 50% respiratory variability, suggesting right atrial pressure of 3 mmHg. FINDINGS  Left Ventricle: Left ventricular ejection fraction, by estimation, is 50 to 55%. The left ventricle has low normal function. The left ventricle has no regional wall motion abnormalities. The left ventricular internal cavity size was normal in size. There is no left ventricular hypertrophy. Left ventricular diastolic parameters are indeterminate. Right Ventricle: The right ventricular size is normal. No increase in right ventricular wall thickness. Right ventricular systolic function is normal. Left Atrium: Left atrial size was normal in size. Right Atrium: Right atrial size was normal in size. Pericardium: There is no evidence of pericardial effusion. Mitral Valve: The mitral valve is normal in structure. Mild mitral valve regurgitation. No evidence of mitral valve stenosis. MV peak gradient, 8.9 mmHg. The mean mitral valve gradient is 2.0 mmHg. Tricuspid Valve: The tricuspid valve is normal in structure. Tricuspid valve regurgitation is mild . No evidence of tricuspid stenosis. Aortic Valve: The aortic valve is normal in structure. Aortic valve regurgitation is not visualized. Mild to moderate aortic stenosis is present. Aortic valve mean gradient measures 14.3 mmHg. Aortic valve peak gradient measures 26.6 mmHg. Aortic valve area, by VTI measures 0.95 cm. Pulmonic Valve: The pulmonic valve was normal in structure. Pulmonic valve  regurgitation is not visualized. No evidence of pulmonic stenosis. Aorta: The aortic root is normal in size and structure. Venous: The inferior vena cava is normal in size with greater than 50% respiratory variability, suggesting right atrial pressure of 3 mmHg. IAS/Shunts: No atrial level shunt detected by color flow Doppler.  LEFT VENTRICLE PLAX 2D LVIDd:         4.46 cm   Diastology LVIDs:         3.41 cm   LV e' medial:    4.57 cm/s LV PW:         1.38 cm   LV E/e' medial:  14.7 LV IVS:  1.05 cm   LV e' lateral:   6.42 cm/s LVOT diam:     2.10 cm   LV E/e' lateral: 10.5 LV SV:         51 LV SV Index:   30 LVOT Area:     3.46 cm  RIGHT VENTRICLE RV Basal diam:  3.03 cm LEFT ATRIUM             Index        RIGHT ATRIUM           Index LA diam:        3.70 cm 2.20 cm/m   RA Area:     16.60 cm LA Vol (A2C):   61.9 ml 36.75 ml/m  RA Volume:   38.60 ml  22.92 ml/m LA Vol (A4C):   55.2 ml 32.77 ml/m LA Biplane Vol: 62.1 ml 36.87 ml/m  AORTIC VALVE                     PULMONIC VALVE AV Area (Vmax):    0.91 cm      PV Vmax:       0.65 m/s AV Area (Vmean):   0.93 cm      PV Vmean:      42.800 cm/s AV Area (VTI):     0.95 cm      PV VTI:        0.130 m AV Vmax:           258.00 cm/s   PV Peak grad:  1.7 mmHg AV Vmean:          179.333 cm/s  PV Mean grad:  1.0 mmHg AV VTI:            0.532 m AV Peak Grad:      26.6 mmHg AV Mean Grad:      14.3 mmHg LVOT Vmax:         67.90 cm/s LVOT Vmean:        48.200 cm/s LVOT VTI:          0.146 m LVOT/AV VTI ratio: 0.27  AORTA Ao Root diam: 2.80 cm MITRAL VALVE MV Area (PHT): 5.13 cm    SHUNTS MV Area VTI:   1.27 cm    Systemic VTI:  0.15 m MV Peak grad:  8.9 mmHg    Systemic Diam: 2.10 cm MV Mean grad:  2.0 mmHg MV Vmax:       1.49 m/s MV Vmean:      65.3 cm/s MV Decel Time: 148 msec MV E velocity: 67.30 cm/s MV A velocity: 37.30 cm/s MV E/A ratio:  1.80 Isaias Cowman MD Electronically signed by Isaias Cowman MD Signature Date/Time: 04/15/2021/1:24:02 PM     Final    CT HEAD CODE STROKE WO CONTRAST  Result Date: 04/14/2021 CLINICAL DATA:  Code stroke. Neuro deficit, acute, stroke suspected. Acute onset of left-sided weakness and facial droop. Last known well 11:30 a.m. EXAM: CT HEAD WITHOUT CONTRAST TECHNIQUE: Contiguous axial images were obtained from the base of the skull through the vertex without intravenous contrast. COMPARISON:  CT head without contrast 02/04/2021 FINDINGS: Brain: Mild generalized atrophy and moderate diffuse white matter disease is similar the prior exam. No acute cortical infarct is present. Insular ribbon is normal. Basal ganglia are intact. No acute hemorrhage or mass lesion is present. The ventricles are of normal size. No significant extraaxial fluid collection is present. Vascular: Atherosclerotic calcifications are present within the cavernous internal carotid arteries  and at the dural margin of both vertebral arteries. No hyperdense vessel is present. Skull: Right frontal and temporal craniotomy noted. No acute osseous abnormality is present. Calvarium is intact. No focal lytic or blastic lesions are present. Sinuses/Orbits: Chronic right maxillary sinus opacification noted. The paranasal sinuses and mastoid air cells are otherwise clear. Bilateral lens replacements are noted. Globes and orbits are otherwise unremarkable. ASPECTS [facility] Stroke Program Early CT Score) - Ganglionic level infarction (caudate, lentiform nuclei, internal capsule, insula, M1-M3 cortex): 7/7 - Supraganglionic infarction (M4-M6 cortex): 3/3 Total score (0-10 with 10 being normal): 10/10 IMPRESSION: 1. No acute intracranial abnormality or significant interval change. 2. Stable atrophy and moderate diffuse white matter disease. This likely reflects the sequela of chronic microvascular ischemia. 3. Right frontal and temporal craniotomy. 4. Chronic right maxillary sinus disease. 5. Aspects 10/10 These results were called by telephone at the time of  interpretation on 04/14/2021 at 3:52 pm to provider Merlyn Lot , who verbally acknowledged these results. The above was relayed via text pager to Dr. Roland Rack on 04/14/2021 at 15:52 . Electronically Signed   By: San Morelle M.D.   On: 04/14/2021 15:57   CT ANGIO HEAD NECK W WO CM (CODE STROKE)  Result Date: 04/14/2021 CLINICAL DATA:  Code stroke EXAM: CT ANGIOGRAPHY HEAD AND NECK TECHNIQUE: Multidetector CT imaging of the head and neck was performed using the standard protocol during bolus administration of intravenous contrast. Multiplanar CT image reconstructions and MIPs were obtained to evaluate the vascular anatomy. Carotid stenosis measurements (when applicable) are obtained utilizing NASCET criteria, using the distal internal carotid diameter as the denominator. CONTRAST:  61mL OMNIPAQUE IOHEXOL 350 MG/ML SOLN COMPARISON:  Same-day noncontrast CT head, MRA head 02/04/2021 FINDINGS: CTA NECK FINDINGS Aortic arch: There is mild soft and calcified atherosclerotic plaque of the imaged aortic arch without hemodynamically significant stenosis or occlusion to the level imaged. There is a linear filling defect in the left subclavian artery which may reflect ulcerated plaque or focal dissection (9-176, 6-94). The subclavian artery proximal and distal to this point are patent. Right carotid system: There is mild calcified atherosclerotic plaque in the right carotid bulb without hemodynamically significant stenosis or occlusion. There is no dissection or aneurysm. Left carotid system: The origin of the left common carotid artery is not included in the field of view. The imaged left common carotid artery is patent. There is mild calcified atherosclerotic plaque in the left carotid bulb resulting in up to approximally 30% stenosis. There is severe stenosis at the origin of the left external carotid artery. There is no dissection or aneurysm. Vertebral arteries: The left vertebral artery is  occluded from its origin through the mid V2 segment at the C3-C4 level, after which there is diminutive reconstitution of flow. The right vertebral artery is widely patent throughout. Skeleton: Postsurgical changes are again noted in the calvarium. The bones are diffusely demineralized. There is no acute osseous abnormality or aggressive osseous lesion. There is no visible canal hematoma. Other neck: There is complete opacification of the right maxillary sinus with hyperdense material which may reflect inspissated secretions or fungal colonization. There is associated hyperostosis. There is a heterogeneous 1.6 cm right thyroid nodule. The soft tissues are otherwise unremarkable. Upper chest: An incidental azygous fissure is noted. The imaged lung apices are clear. Review of the MIP images confirms the above findings CTA HEAD FINDINGS Anterior circulation: There is mild calcified atherosclerotic plaque of the bilateral intracranial ICAs without hemodynamically significant stenosis or occlusion.  The bilateral MCAs are patent with mild atherosclerotic irregularity distally. The bilateral ACAs are patent. The anterior communicating artery is patent. There is mild atherosclerotic irregularity distally There is no aneurysm. Posterior circulation: The left V4 segment is diminutive with multifocal irregularity and narrowing but patent, similar to the prior MRA head. The dominant right V4 segment is patent. PICA is identified bilaterally. The basilar artery is patent but also mildly diminutive There are fetal origins of the bilateral PCAs with prominent bilateral posterior communicating arteries. There is focal critical stenosis/occlusion of the right P2 segment which appears worsened compared to the prior MRA head (8-135). The right PCA distal to this point is patent with mild atherosclerotic irregularity distally. The left PCA is patent. There is no aneurysm. Venous sinuses: As permitted by contrast timing, patent.  Anatomic variants: As above. Review of the MIP images confirms the above findings IMPRESSION: 1. 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 The right vertebral artery is widely patent. 2. Focal critical stenosis/occlusion of the right P2 segment with distal reconstitution. 3. Otherwise, mild intracranial atherosclerotic disease with no other high-grade stenosis or occlusion. 4. Mild calcified atherosclerotic plaque in the bilateral carotid bulbs without high-grade stenosis or occlusion. 5. Linear filling defect in the left subclavian artery which may reflect ulcerated plaque or focal dissection. The subclavian artery proximal and distal to this point are patent. 6. Heterogeneous 1.6 cm right thyroid nodule. Consider thyroid US if clinically warranted given patient age. Reference: J Am Coll Radiol. 2015 Feb;12(2): 143-50 Impression points #1 and 2 were called by telephone at the time of interpretation on 04/14/2021 at 5:01 pm to provider Merlyn Lot , who verbally acknowledged these results. Electronically Signed   By: Valetta Mole M.D.   On: 04/14/2021 17:03    Microbiology: Results for orders placed or performed during the hospital encounter of 04/14/21  Resp Panel by RT-PCR (Flu A&B, Covid) Nasopharyngeal Swab     Status: None   Collection Time: 04/14/21  6:22 PM   Specimen: Nasopharyngeal Swab; Nasopharyngeal(NP) swabs in vial transport medium  Result Value Ref Range Status   SARS Coronavirus 2 by RT PCR NEGATIVE NEGATIVE Final    Comment: (NOTE) SARS-CoV-2 target nucleic acids are NOT DETECTED.  The SARS-CoV-2 RNA is generally detectable in upper respiratory specimens during the acute phase of infection. The lowest concentration of SARS-CoV-2 viral copies this assay can detect is 138 copies/mL. A negative result does not  preclude SARS-Cov-2 infection and should not be used as the sole basis for treatment or other patient management decisions. A negative result may occur with  improper specimen collection/handling, submission of specimen other than nasopharyngeal swab, presence of viral mutation(s) within the areas targeted by this assay, and inadequate number of viral copies(<138 copies/mL). A negative result must be combined with clinical observations, patient history, and epidemiological information. The expected result is Negative.  Fact Sheet for Patients:  EntrepreneurPulse.com.au  Fact Sheet for Healthcare Providers:  IncredibleEmployment.be  This test is no t yet approved or cleared by the [place] FDA and  has been authorized for detection and/or diagnosis of SARS-CoV-2 by FDA under an Emergency Use Authorization (EUA). This EUA will remain  in effect (meaning this test can be used) for the duration of the COVID-19 declaration under Section 564(b)(1) of the Act, 21 U.S.C.section 360bbb-3(b)(1), unless the  authorization is terminated  or revoked sooner.       Influenza A by PCR NEGATIVE NEGATIVE Final   Influenza B by PCR NEGATIVE NEGATIVE Final    Comment: (NOTE) The Xpert Xpress SARS-CoV-2/FLU/RSV plus assay is intended as an aid in the diagnosis of influenza from Nasopharyngeal swab specimens and should not be used as a sole basis for treatment. Nasal washings and aspirates are unacceptable for Xpert Xpress SARS-CoV-2/FLU/RSV testing.  Fact Sheet for Patients: EntrepreneurPulse.com.au  Fact Sheet for Healthcare Providers: IncredibleEmployment.be  This test is not yet approved or cleared by the [place] FDA and has been authorized for detection and/or diagnosis of SARS-CoV-2 by FDA under an Emergency Use Authorization (EUA). This EUA will remain in effect (meaning this test can be used) for the  duration of the COVID-19 declaration under Section 564(b)(1) of the Act, 21 U.S.C. section 360bbb-3(b)(1), unless the authorization is terminated or revoked.  Performed at [facility], [address] [place], [place] [address]   Resp Panel by RT-PCR (Flu A&B, Covid) Nasopharyngeal Swab     Status: None   Collection Time: 04/24/21  3:37 PM   Specimen: Nasopharyngeal Swab; Nasopharyngeal(NP) swabs in vial transport medium  Result Value Ref Range Status   SARS Coronavirus 2 by RT PCR NEGATIVE NEGATIVE Final    Comment: (NOTE) SARS-CoV-2 target nucleic acids are NOT DETECTED.  The SARS-CoV-2 RNA is generally detectable in upper respiratory specimens during the acute phase of infection. The lowest concentration of SARS-CoV-2 viral copies this assay can detect is 138 copies/mL. A negative result does not preclude SARS-Cov-2 infection and should not be used as the sole basis for treatment or other patient management decisions. A negative result may occur with  improper specimen collection/handling, submission of specimen other than nasopharyngeal swab, presence of viral mutation(s) within the areas targeted by this assay, and inadequate number of viral copies(<138 copies/mL). A negative result must be combined with clinical observations, patient history, and epidemiological information. The expected result is Negative.  Fact Sheet for Patients:  EntrepreneurPulse.com.au  Fact Sheet for Healthcare Providers:  IncredibleEmployment.be  This test is no t yet approved or cleared by the [place] FDA and  has been authorized for detection and/or diagnosis of SARS-CoV-2 by FDA under an Emergency Use Authorization (EUA). This EUA will remain  in effect (meaning this test can be used) for the duration of the COVID-19 declaration under Section 564(b)(1) of the Act, 21 U.S.C.section 360bbb-3(b)(1), unless the authorization is terminated  or  revoked sooner.       Influenza A by PCR NEGATIVE NEGATIVE Final   Influenza B by PCR NEGATIVE NEGATIVE Final    Comment: (NOTE) The Xpert Xpress SARS-CoV-2/FLU/RSV plus assay is intended as an aid in the diagnosis of influenza from Nasopharyngeal swab specimens and should not be used as a sole basis for treatment. Nasal washings and aspirates are unacceptable for Xpert Xpress SARS-CoV-2/FLU/RSV testing.  Fact Sheet for Patients: EntrepreneurPulse.com.au  Fact Sheet for Healthcare Providers: IncredibleEmployment.be  This test is not yet approved or cleared by the [place] FDA and has been authorized for detection and/or diagnosis of SARS-CoV-2 by FDA under an Emergency Use Authorization (EUA). This EUA will remain in effect (meaning this test can be used) for the duration of the COVID-19 declaration under Section 564(b)(1) of the Act, 21 U.S.C. section 360bbb-3(b)(1), unless the authorization is terminated or revoked.  Performed at [facility], [address], [place], [place] [address]  Labs: CBC: Recent Labs  Lab 04/23/21 0619 04/24/21 0754 04/25/21 0558  WBC 7.0 6.5 7.8  NEUTROABS 4.4 4.3 5.2  HGB 13.0 12.7 13.8  HCT 40.1 37.5 41.3  MCV 97.8 94.7 96.5  PLT 252 255 195   Basic Metabolic Panel: Recent Labs  Lab 04/19/21 0730 04/21/21 0541 04/23/21 0619 04/24/21 0754 04/25/21 0558  NA 142 139 144 141 139  K 3.7 3.9 4.2 4.2 4.3  CL 110 107 111 108 104  CO2 25 24 25 25 23   GLUCOSE 154* 185* 158* 143* 149*  BUN 30* 29* 30* 30* 29*  CREATININE 1.52* 1.55* 1.65* 1.68* 1.60*  CALCIUM 8.5* 8.9 8.8* 8.9 9.1   Liver Function Tests: No results for input(s): AST, ALT, ALKPHOS, BILITOT, PROT, ALBUMIN in the last 168 hours. CBG: Recent Labs  Lab 04/24/21 0736 04/24/21 1303 04/24/21 1730 04/24/21 2104 04/25/21 0755  GLUCAP 134* 100* 204* 190* 144*    Discharge time spent: 25  minutes.  Signed: Edwin Dada, MD [facility] 04/25/2021

## 2021-04-25 NOTE — TOC Transition Note (Addendum)
Transition of Care [facility]) - CM/SW Discharge Note   Patient Details  Name: [name] MRN: [id number] Date of Birth: 09-29-1925  [facility]) CM/SW Contact:  Magnus Ivan, LCSW Phone Number: 04/25/2021, 12:23 PM   Clinical Narrative:   Discharge to [facility] today. Room 304A. Confirmed with Admissions Worker [place]. Updated MD, RN, and left VM for daughter [name]. Asked RN to call report. EMS paperwork completed. Signed DNR on file. Will arrange [facility] when RN is ready.  12:40- [facility] called for transport. Patient is 4th on the list. RN notified.       Final next level of care: [place] Barriers to Discharge: Barriers Resolved   Patient Goals and CMS Choice        Discharge Placement              Patient chooses bed at: [facility] Patient to be transferred to facility by: [facility] Name of family member notified: left VM for daughter [name] Patient and family notified of of transfer: 04/25/21  Discharge Plan and Services                                     Social Determinants of Health (SDOH) Interventions     Readmission Risk Interventions No flowsheet data found.

## 2021-04-25 NOTE — TOC Progression Note (Signed)
Transition of Care [facility]) - Progression Note    Patient Details  Name: [name] MRN: [id number] Date of Birth: [birth year]  Transition of Care [facility]) CM/SW [place], LCSW Phone Number: 04/25/2021, 10:10 AM  Clinical Narrative:    Left confidential VM for Sharyn Lull in Admissions at [facility] requesting confirmation on if they can accept patient today.   Expected Discharge Plan:  (TBD) Barriers to Discharge: Continued Medical Work up  Expected Discharge Plan and Services Expected Discharge Plan:  (TBD)       Living arrangements for the past 2 months: Single Family Home Expected Discharge Date: 04/25/21                                     Social Determinants of Health (SDOH) Interventions    Readmission Risk Interventions No flowsheet data found.

## 2021-04-25 NOTE — Progress Notes (Signed)
Attempted to call report to [facility]. Was unable to connect. Gave a report to EMS. Patient is leaving [facility] now

## 2021-05-06 ENCOUNTER — Other Ambulatory Visit: Payer: Self-pay

## 2021-05-06 ENCOUNTER — Non-Acute Institutional Stay: Payer: Medicare Other | Admitting: Student

## 2021-05-06 DIAGNOSIS — I10 Essential (primary) hypertension: Secondary | ICD-10-CM

## 2021-05-06 DIAGNOSIS — G546 Phantom limb syndrome with pain: Secondary | ICD-10-CM

## 2021-05-06 DIAGNOSIS — I63432 Cerebral infarction due to embolism of left posterior cerebral artery: Secondary | ICD-10-CM

## 2021-05-06 DIAGNOSIS — Z515 Encounter for palliative care: Secondary | ICD-10-CM

## 2021-05-06 NOTE — Progress Notes (Signed)
[name] Consult Note Telephone: [phone]  Fax: [phone]    Date of encounter: 05/06/21 11:47 AM PATIENT NAME: [name] [address] [place] [address]   [phone] (home) [phone] (work) DOB: 29-May-1925 MRN: [id number] PRIMARY CARE PROVIDER:    Sofie Hartigan, MD,  [place] [address] [phone]  REFERRING PROVIDER:   Vilinda Boehringer, NP/ Dr. Shon Carlson  RESPONSIBLE PARTY:    Contact Information     Name Relation Home Work Mobile   [name] [place] [facility] [facility] Daughter   [phone]   Carlson, Kathleen Conn Granddaughter   [place] Son [phone]  [phone]        I met face to face with patient and family in the facility. Palliative Care was asked to follow this patient by consultation request of Dr. Ernst Spell, NP to address advance care planning and complex medical decision making. This is a follow up visit.                                   ASSESSMENT AND PLAN / RECOMMENDATIONS:   Advance Care Planning/Goals of Care: Goals include to maximize quality of life and symptom management. Patient/health care surrogate gave his/her permission to discuss. Our advance care planning conversation included a discussion about:    The value and importance of advance care planning  Experiences with loved ones who have been seriously ill or have died  Exploration of personal, cultural or spiritual beliefs that might influence medical decisions  Exploration of goals of care in the event of a sudden injury or illness  CODE STATUS: DNR  Education provided on palliative medicine services. Son at bedside.  Plan is for patient to return home after completing therapy. Patient and son are hoping for patient to return to previous level of care.  CODE STATUS reviewed; patient will continue to be a DO NOT RESUSCITATE.  Symptom Management/Plan:  [name] has improved.  Continues with generalized weakness. Patient currently receiving skilled OT, PT, ST. Plans to return home once therapy is completed. She is to continue on Eliquis. Staff to continue assisting with adl's as needed, use w/c for locomotion. Follow up with Neurology as scheduled.   Hypertension-continue amlodipine, coreg and lisinopril. Routine blood pressure checks.   Phantom pain-patient has not had any complaints of pain. Her Lyrica was discontinued in the hospital due to altered mental status. Discussed with son, should pain worsen, we can have medication restarted, trial at 25 mg; he is agreeable to this plan.   Follow up Palliative Care Visit: Palliative care will continue to follow for complex medical decision making, advance care planning, and clarification of goals. Return in 6-8 weeks or prn.   This visit was coded based on medical decision making (MDM).  PPS: 40%  HOSPICE ELIGIBILITY/DIAGNOSIS: TBD  Chief Complaint: Palliative medicine follow-up visit.  HISTORY OF PRESENT ILLNESS:  [name] is a 88 y.o. year old female  with stroke, paroxysmal atrial fibrillation, T2DM, hypertension, hyperlipidemia, PAD, CKD 4, hypothyroidism, nocturnal hypoxemia, phantom pain. Son states patient had difficulty speaking, and thought it was related to her blood sugar. After eating lunch she cleared up but then began to show symptoms of unclear speech and difficulty completing her normal routine. Patient was transported to hospital per son and found to have CVA and hypertensive urgency. She received tPA. She was hospitalized 1/3/-1/1402023  due to stroke, hypertension, acute metabolic encephalopathy.    [facility] currently at [name]. She is receiving skilled therapy services. She denies having any pain, phantom pain. Son states patient had been receiving Lyrica but this was stopped during hospitalization due to AMS. She denies shortness of breath, constipation, nausea. She endorses a  fair to good appetite.  She is sleeping well most nights; she states she did not sleep as well last night.   History obtained from review of EMR, discussion with primary team, and interview with family, facility staff/caregiver and/or [name].  I reviewed available labs, medications, imaging, studies and related documents from the EMR.  Records reviewed and summarized above.   ROS  General: NAD EYES: denies vision changes ENMT: denies dysphagia Cardiovascular: denies chest pain, denies DOE Pulmonary: denies cough, denies increased SOB Abdomen: denies constipation, endorses continence of bowel GU: denies dysuria MSK:+weakness, right AKA Skin: denies rashes or wounds Neurological: denies pain Psych: Endorses stable Heme/lymph/immuno: denies bruises, abnormal bleeding  Physical Exam: Weight: 122/8 pounds Pulse 82, resp 18, b/p 156/72, sats 96% on room air Constitutional: NAD General: frail appearing EYES: anicteric sclera, lids intact, no discharge  ENMT: intact hearing, oral mucous membranes moist, dentition intact CV: S1S2, RRR, murmur, no LE edema Pulmonary: LCTA, no increased work of breathing, no cough, room air Abdomen: normo-active BS + 4 quadrants, soft and non tender GU: deferred MSK: moves extremities, Right AKA Skin: warm and dry, no rashes or wounds on visible skin Neuro: generalized weakness, A & O x 3 Psych: non-anxious affect, pleasant Hem/lymph/immuno: no widespread bruising   Thank you for the opportunity to participate in the care of [name].  The palliative care team will continue to follow. Please call our office at [phone] if we can be of additional assistance.   Kathleen Slocumb, NP   COVID-19 PATIENT SCREENING TOOL Asked and negative response unless otherwise noted:   Have you had symptoms of covid, tested positive or been in contact with someone with symptoms/positive test in the past 5-10 days? No

## 2021-05-30 ENCOUNTER — Inpatient Hospital Stay: Payer: Medicare Other

## 2021-05-30 ENCOUNTER — Emergency Department: Payer: Medicare Other

## 2021-05-30 ENCOUNTER — Inpatient Hospital Stay
Admission: EM | Admit: 2021-05-30 | Discharge: 2021-06-03 | DRG: 291 | Disposition: A | Discharge status: Home Health | Payer: Medicare Other | Attending: Internal Medicine | Admitting: Internal Medicine

## 2021-05-30 ENCOUNTER — Encounter: Payer: Self-pay | Admitting: Emergency Medicine

## 2021-05-30 ENCOUNTER — Other Ambulatory Visit: Payer: Self-pay

## 2021-05-30 DIAGNOSIS — N1831 Chronic kidney disease, stage 3a: Secondary | ICD-10-CM

## 2021-05-30 DIAGNOSIS — I4819 Other persistent atrial fibrillation: Secondary | ICD-10-CM | POA: Diagnosis present

## 2021-05-30 DIAGNOSIS — Z89611 Acquired absence of right leg above knee: Secondary | ICD-10-CM

## 2021-05-30 DIAGNOSIS — R0902 Hypoxemia: Secondary | ICD-10-CM

## 2021-05-30 DIAGNOSIS — E041 Nontoxic single thyroid nodule: Secondary | ICD-10-CM | POA: Diagnosis present

## 2021-05-30 DIAGNOSIS — N1832 Chronic kidney disease, stage 3b: Secondary | ICD-10-CM | POA: Diagnosis present

## 2021-05-30 DIAGNOSIS — Z79890 Hormone replacement therapy (postmenopausal): Secondary | ICD-10-CM | POA: Diagnosis not present

## 2021-05-30 DIAGNOSIS — R590 Localized enlarged lymph nodes: Secondary | ICD-10-CM | POA: Diagnosis present

## 2021-05-30 DIAGNOSIS — Z9049 Acquired absence of other specified parts of digestive tract: Secondary | ICD-10-CM | POA: Diagnosis not present

## 2021-05-30 DIAGNOSIS — E1122 Type 2 diabetes mellitus with diabetic chronic kidney disease: Secondary | ICD-10-CM | POA: Diagnosis present

## 2021-05-30 DIAGNOSIS — Z794 Long term (current) use of insulin: Secondary | ICD-10-CM

## 2021-05-30 DIAGNOSIS — E039 Hypothyroidism, unspecified: Secondary | ICD-10-CM | POA: Diagnosis present

## 2021-05-30 DIAGNOSIS — E785 Hyperlipidemia, unspecified: Secondary | ICD-10-CM | POA: Diagnosis present

## 2021-05-30 DIAGNOSIS — J189 Pneumonia, unspecified organism: Principal | ICD-10-CM

## 2021-05-30 DIAGNOSIS — R54 Age-related physical debility: Secondary | ICD-10-CM | POA: Diagnosis present

## 2021-05-30 DIAGNOSIS — I480 Paroxysmal atrial fibrillation: Secondary | ICD-10-CM | POA: Diagnosis present

## 2021-05-30 DIAGNOSIS — G546 Phantom limb syndrome with pain: Secondary | ICD-10-CM | POA: Diagnosis present

## 2021-05-30 DIAGNOSIS — J9811 Atelectasis: Secondary | ICD-10-CM

## 2021-05-30 DIAGNOSIS — I739 Peripheral vascular disease, unspecified: Secondary | ICD-10-CM | POA: Diagnosis present

## 2021-05-30 DIAGNOSIS — E1151 Type 2 diabetes mellitus with diabetic peripheral angiopathy without gangrene: Secondary | ICD-10-CM | POA: Diagnosis present

## 2021-05-30 DIAGNOSIS — Z7901 Long term (current) use of anticoagulants: Secondary | ICD-10-CM | POA: Diagnosis not present

## 2021-05-30 DIAGNOSIS — I5033 Acute on chronic diastolic (congestive) heart failure: Secondary | ICD-10-CM | POA: Insufficient documentation

## 2021-05-30 DIAGNOSIS — I5043 Acute on chronic combined systolic (congestive) and diastolic (congestive) heart failure: Secondary | ICD-10-CM | POA: Diagnosis present

## 2021-05-30 DIAGNOSIS — A419 Sepsis, unspecified organism: Secondary | ICD-10-CM

## 2021-05-30 DIAGNOSIS — I69351 Hemiplegia and hemiparesis following cerebral infarction affecting right dominant side: Secondary | ICD-10-CM | POA: Diagnosis not present

## 2021-05-30 DIAGNOSIS — Z882 Allergy status to sulfonamides status: Secondary | ICD-10-CM | POA: Diagnosis not present

## 2021-05-30 DIAGNOSIS — I130 Hypertensive heart and chronic kidney disease with heart failure and stage 1 through stage 4 chronic kidney disease, or unspecified chronic kidney disease: Principal | ICD-10-CM | POA: Diagnosis present

## 2021-05-30 DIAGNOSIS — J9601 Acute respiratory failure with hypoxia: Secondary | ICD-10-CM | POA: Diagnosis present

## 2021-05-30 DIAGNOSIS — I959 Hypotension, unspecified: Secondary | ICD-10-CM | POA: Diagnosis present

## 2021-05-30 DIAGNOSIS — J90 Pleural effusion, not elsewhere classified: Secondary | ICD-10-CM

## 2021-05-30 DIAGNOSIS — Z20822 Contact with and (suspected) exposure to covid-19: Secondary | ICD-10-CM | POA: Diagnosis present

## 2021-05-30 DIAGNOSIS — I10 Essential (primary) hypertension: Secondary | ICD-10-CM

## 2021-05-30 DIAGNOSIS — I350 Nonrheumatic aortic (valve) stenosis: Secondary | ICD-10-CM

## 2021-05-30 DIAGNOSIS — Z7982 Long term (current) use of aspirin: Secondary | ICD-10-CM

## 2021-05-30 DIAGNOSIS — Z9889 Other specified postprocedural states: Secondary | ICD-10-CM

## 2021-05-30 DIAGNOSIS — Z79899 Other long term (current) drug therapy: Secondary | ICD-10-CM | POA: Diagnosis not present

## 2021-05-30 DIAGNOSIS — Z66 Do not resuscitate: Secondary | ICD-10-CM | POA: Diagnosis present

## 2021-05-30 DIAGNOSIS — I214 Non-ST elevation (NSTEMI) myocardial infarction: Secondary | ICD-10-CM

## 2021-05-30 LAB — RESP PANEL BY RT-PCR (FLU A&B, COVID) ARPGX2
Influenza A by PCR: NEGATIVE
Influenza B by PCR: NEGATIVE
SARS Coronavirus 2 by RT PCR: NEGATIVE

## 2021-05-30 LAB — CBC WITH DIFFERENTIAL/PLATELET
Abs Immature Granulocytes: 0.06 10*3/uL (ref 0.00–0.07)
Basophils Absolute: 0 10*3/uL (ref 0.0–0.1)
Basophils Relative: 0 %
Eosinophils Absolute: 0.1 10*3/uL (ref 0.0–0.5)
Eosinophils Relative: 1 %
HCT: 41.7 % (ref 36.0–46.0)
Hemoglobin: 13.3 g/dL (ref 12.0–15.0)
Immature Granulocytes: 1 %
Lymphocytes Relative: 9 %
Lymphs Abs: 1.1 10*3/uL (ref 0.7–4.0)
MCH: 30.4 pg (ref 26.0–34.0)
MCHC: 31.9 g/dL (ref 30.0–36.0)
MCV: 95.4 fL (ref 80.0–100.0)
Monocytes Absolute: 1.6 10*3/uL — ABNORMAL HIGH (ref 0.1–1.0)
Monocytes Relative: 12 %
Neutro Abs: 9.8 10*3/uL — ABNORMAL HIGH (ref 1.7–7.7)
Neutrophils Relative %: 77 %
Platelets: 251 10*3/uL (ref 150–400)
RBC: 4.37 MIL/uL (ref 3.87–5.11)
RDW: 12.5 % (ref 11.5–15.5)
WBC: 12.7 10*3/uL — ABNORMAL HIGH (ref 4.0–10.5)
nRBC: 0 % (ref 0.0–0.2)

## 2021-05-30 LAB — BLOOD GAS, VENOUS
Acid-Base Excess: 1.7 mmol/L (ref 0.0–2.0)
Bicarbonate: 27.7 mmol/L (ref 20.0–28.0)
O2 Saturation: 100 %
Patient temperature: 37
pCO2, Ven: 48 mmHg (ref 44–60)
pH, Ven: 7.37 (ref 7.25–7.43)
pO2, Ven: 137 mmHg — ABNORMAL HIGH (ref 32–45)

## 2021-05-30 LAB — GLUCOSE, CAPILLARY
Glucose-Capillary: 114 mg/dL — ABNORMAL HIGH (ref 70–99)
Glucose-Capillary: 115 mg/dL — ABNORMAL HIGH (ref 70–99)
Glucose-Capillary: 111 mg/dL — ABNORMAL HIGH (ref 70–99)

## 2021-05-30 LAB — PROTIME-INR
INR: 1.3 — ABNORMAL HIGH (ref 0.8–1.2)
Prothrombin Time: 16.1 seconds — ABNORMAL HIGH (ref 11.4–15.2)

## 2021-05-30 LAB — COMPREHENSIVE METABOLIC PANEL
ALT: 36 U/L (ref 0–44)
AST: 45 U/L — ABNORMAL HIGH (ref 15–41)
Albumin: 3.4 g/dL — ABNORMAL LOW (ref 3.5–5.0)
Alkaline Phosphatase: 105 U/L (ref 38–126)
Anion gap: 9 (ref 5–15)
BUN: 27 mg/dL — ABNORMAL HIGH (ref 8–23)
CO2: 24 mmol/L (ref 22–32)
Calcium: 9.1 mg/dL (ref 8.9–10.3)
Chloride: 109 mmol/L (ref 98–111)
Creatinine, Ser: 1.78 mg/dL — ABNORMAL HIGH (ref 0.44–1.00)
GFR, Estimated: 26 mL/min — ABNORMAL LOW (ref >60)
Glucose, Bld: 230 mg/dL — ABNORMAL HIGH (ref 70–99)
Potassium: 4.3 mmol/L (ref 3.5–5.1)
Sodium: 142 mmol/L (ref 135–145)
Total Bilirubin: 1.2 mg/dL (ref 0.3–1.2)
Total Protein: 8.1 g/dL (ref 6.5–8.1)

## 2021-05-30 LAB — TROPONIN I (HIGH SENSITIVITY)
Troponin I (High Sensitivity): 63 ng/L — ABNORMAL HIGH (ref <18)
Troponin I (High Sensitivity): 62 ng/L — ABNORMAL HIGH (ref <18)

## 2021-05-30 LAB — BRAIN NATRIURETIC PEPTIDE: B Natriuretic Peptide: 563.9 pg/mL — ABNORMAL HIGH (ref 0.0–100.0)

## 2021-05-30 LAB — PROCALCITONIN: Procalcitonin: 0.11 ng/mL

## 2021-05-30 LAB — URINALYSIS, COMPLETE (UACMP) WITH MICROSCOPIC
Bacteria, UA: NONE SEEN
Bilirubin Urine: NEGATIVE
Glucose, UA: 150 mg/dL — AB
Hgb urine dipstick: NEGATIVE
Ketones, ur: NEGATIVE mg/dL
Leukocytes,Ua: NEGATIVE
Nitrite: NEGATIVE
Protein, ur: >=300 mg/dL — AB
Specific Gravity, Urine: 1.017 (ref 1.005–1.030)
Squamous Epithelial / HPF: NONE SEEN (ref 0–5)
WBC, UA: NONE SEEN WBC/hpf (ref 0–5)
pH: 5 (ref 5.0–8.0)

## 2021-05-30 LAB — APTT: aPTT: 29 seconds (ref 24–36)

## 2021-05-30 LAB — LACTIC ACID, PLASMA
Lactic Acid, Venous: 1.1 mmol/L (ref 0.5–1.9)
Lactic Acid, Venous: 0.9 mmol/L (ref 0.5–1.9)

## 2021-05-30 MED ORDER — GUAIFENESIN-DM 100-10 MG/5ML PO SYRP
10.0000 mL | ORAL_SOLUTION | ORAL | Status: DC | PRN
  Administered 2021-05-31: 10 mL via ORAL
  Filled 2021-05-30: qty 10

## 2021-05-30 MED ORDER — SODIUM CHLORIDE 0.9 % IV SOLN
2.0000 g | Freq: Once | INTRAVENOUS | Status: AC
  Administered 2021-05-30: 2 g via INTRAVENOUS
  Filled 2021-05-30: qty 2

## 2021-05-30 MED ORDER — SODIUM CHLORIDE 0.9 % IV SOLN
2.0000 g | INTRAVENOUS | Status: DC
  Administered 2021-05-30 – 2021-06-02 (×4): 2 g via INTRAVENOUS
  Filled 2021-05-30: qty 20
  Filled 2021-05-30: qty 2
  Filled 2021-05-30 (×2): qty 20
  Filled 2021-05-30: qty 2

## 2021-05-30 MED ORDER — IPRATROPIUM-ALBUTEROL 0.5-2.5 (3) MG/3ML IN SOLN
9.0000 mL | Freq: Once | RESPIRATORY_TRACT | Status: AC
  Administered 2021-05-30: 9 mL via RESPIRATORY_TRACT
  Filled 2021-05-30: qty 9

## 2021-05-30 MED ORDER — ACETAMINOPHEN 325 MG PO TABS
325.0000 mg | ORAL_TABLET | Freq: Four times a day (QID) | ORAL | Status: DC | PRN
  Administered 2021-06-01: 650 mg via ORAL
  Filled 2021-05-30: qty 2

## 2021-05-30 MED ORDER — ORAL CARE MOUTH RINSE
15.0000 mL | Freq: Two times a day (BID) | OROMUCOSAL | Status: DC
  Administered 2021-05-30 – 2021-06-03 (×8): 15 mL via OROMUCOSAL

## 2021-05-30 MED ORDER — LEVOTHYROXINE SODIUM 88 MCG PO TABS
88.0000 ug | ORAL_TABLET | Freq: Every day | ORAL | Status: DC
  Administered 2021-05-31 – 2021-06-03 (×3): 88 ug via ORAL
  Filled 2021-05-30 (×4): qty 1

## 2021-05-30 MED ORDER — OCUVITE-LUTEIN PO CAPS
1.0000 | ORAL_CAPSULE | Freq: Every evening | ORAL | Status: DC
  Administered 2021-05-31 – 2021-06-02 (×3): 1 via ORAL
  Filled 2021-05-30 (×4): qty 1

## 2021-05-30 MED ORDER — LISINOPRIL 10 MG PO TABS
5.0000 mg | ORAL_TABLET | Freq: Every day | ORAL | Status: DC
Start: 2021-05-31 — End: 2021-05-31
  Filled 2021-05-30: qty 1

## 2021-05-30 MED ORDER — CALCIUM CARBONATE 1250 (500 CA) MG PO TABS
1250.0000 mg | ORAL_TABLET | Freq: Two times a day (BID) | ORAL | Status: DC
  Administered 2021-06-01 – 2021-06-03 (×4): 1250 mg via ORAL
  Filled 2021-05-30 (×7): qty 1

## 2021-05-30 MED ORDER — APIXABAN 2.5 MG PO TABS
2.5000 mg | ORAL_TABLET | Freq: Two times a day (BID) | ORAL | Status: DC
  Administered 2021-05-30 – 2021-06-03 (×6): 2.5 mg via ORAL
  Filled 2021-05-30 (×8): qty 1

## 2021-05-30 MED ORDER — ONDANSETRON HCL 4 MG PO TABS: 4.0000 mg | ORAL_TABLET | Freq: Four times a day (QID) | ORAL | Status: DC | PRN

## 2021-05-30 MED ORDER — FUROSEMIDE 20 MG PO TABS
20.0000 mg | ORAL_TABLET | Freq: Every day | ORAL | Status: DC
Start: 2021-05-31 — End: 2021-05-31

## 2021-05-30 MED ORDER — ONDANSETRON HCL 4 MG/2ML IJ SOLN: 4.0000 mg | Freq: Four times a day (QID) | INTRAMUSCULAR | Status: DC | PRN

## 2021-05-30 MED ORDER — SODIUM CHLORIDE 0.9% FLUSH
3.0000 mL | INTRAVENOUS | Status: DC | PRN
Start: 2021-05-30 — End: 2021-06-03

## 2021-05-30 MED ORDER — SODIUM CHLORIDE 0.9 % IV SOLN
500.0000 mg | INTRAVENOUS | Status: DC
  Administered 2021-05-30 – 2021-06-01 (×3): 500 mg via INTRAVENOUS
  Filled 2021-05-30 (×2): qty 5
  Filled 2021-05-30: qty 500
  Filled 2021-05-30: qty 5

## 2021-05-30 MED ORDER — PRESERVISION AREDS 2 PO CAPS
ORAL_CAPSULE | Freq: Every evening | ORAL | Status: DC
Start: 2021-05-30 — End: 2021-05-30

## 2021-05-30 MED ORDER — INSULIN ASPART 100 UNIT/ML IJ SOLN
0.0000 [IU] | INTRAMUSCULAR | Status: DC
  Administered 2021-05-31: 3 [IU] via SUBCUTANEOUS
  Administered 2021-05-31: 2 [IU] via SUBCUTANEOUS
  Administered 2021-05-31 – 2021-06-01 (×2): 1 [IU] via SUBCUTANEOUS
  Administered 2021-06-01 (×2): 3 [IU] via SUBCUTANEOUS
  Administered 2021-06-01 – 2021-06-02 (×2): 1 [IU] via SUBCUTANEOUS
  Filled 2021-05-30 (×7): qty 1

## 2021-05-30 MED ORDER — VANCOMYCIN HCL 1500 MG/300ML IV SOLN
1500.0000 mg | Freq: Once | INTRAVENOUS | Status: AC
  Administered 2021-05-30: 1500 mg via INTRAVENOUS
  Filled 2021-05-30: qty 300

## 2021-05-30 MED ORDER — MIRTAZAPINE 15 MG PO TABS
15.0000 mg | ORAL_TABLET | Freq: Every day | ORAL | Status: DC
  Administered 2021-05-30 – 2021-06-02 (×4): 15 mg via ORAL
  Filled 2021-05-30 (×4): qty 1

## 2021-05-30 MED ORDER — ASPIRIN 81 MG PO CHEW
324.0000 mg | CHEWABLE_TABLET | Freq: Once | ORAL | Status: AC
  Administered 2021-05-30: 324 mg via ORAL
  Filled 2021-05-30: qty 4

## 2021-05-30 MED ORDER — SODIUM CHLORIDE 0.9 % IV SOLN
250.0000 mL | INTRAVENOUS | Status: DC | PRN
  Administered 2021-05-31: 250 mL via INTRAVENOUS

## 2021-05-30 MED ORDER — ATORVASTATIN CALCIUM 20 MG PO TABS
40.0000 mg | ORAL_TABLET | Freq: Every day | ORAL | Status: DC
  Administered 2021-06-01 – 2021-06-03 (×2): 40 mg via ORAL
  Filled 2021-05-30 (×4): qty 2

## 2021-05-30 MED ORDER — SODIUM CHLORIDE 0.9% FLUSH
3.0000 mL | Freq: Two times a day (BID) | INTRAVENOUS | Status: DC
  Administered 2021-05-30 – 2021-06-03 (×8): 3 mL via INTRAVENOUS

## 2021-05-30 MED ORDER — MELATONIN 5 MG PO TABS
5.0000 mg | ORAL_TABLET | Freq: Every day | ORAL | Status: DC
  Administered 2021-05-30 – 2021-06-02 (×4): 5 mg via ORAL
  Filled 2021-05-30 (×4): qty 1

## 2021-05-30 MED ORDER — AMLODIPINE BESYLATE 10 MG PO TABS
10.0000 mg | ORAL_TABLET | Freq: Every day | ORAL | Status: DC
  Filled 2021-05-30: qty 1

## 2021-05-30 NOTE — Progress Notes (Signed)
SLP Cancellation Note  Patient Details Name: [name] MRN: [id number] DOB: [birth year]   Cancelled treatment:       Reason Eval/Treat Not Completed: Patient at procedure or test/unavailable  Pt was leaving for CT as I was speaking with pt's daughter. CT felt to be priority, therefore plan was made for pt to be seen for a bedside swallow Evaluation on next day. Pt to remain NPO until then to allow pt time to recover and rest. Pt's daughter and MD in agreement with this plan.   While pt's daughter reports that pt is eating "regular food" when asked about specific food items, almost all items are considered to be mechanical soft in nature.   [name] M.S., CCC-SLP, [place] Office [phone]   Stormy Fabian 05/30/2021, 2:32 PM

## 2021-05-30 NOTE — H&P (Signed)
History and Physical    Patient: [name] UMP:[id number] DOB: [birth year] DOA: 05/30/2021 DOS: the patient was seen and examined on 05/30/2021 PCP: Sofie Hartigan, MD  Patient coming from: Home  Chief Complaint:  Chief Complaint  Patient presents with   Shortness of Breath   History obtained from patient's children at the bedside. HPI: [name] is a 88 y.o. female with medical history significant for recent CVA with right-sided hemiparesis status post tPA administration, history of peripheral arterial disease status post right AKA, diabetes mellitus with complications of stage IV chronic kidney disease, hypertension and dyslipidemia who was recently discharged from rehab following her hospitalization for acute stroke. She was brought into the emergency room by private vehicle from home for evaluation of worsening shortness of breath. Her son states that symptoms started 4 days prior to her presentation and she was seen by her primary care provider who recommended she resume her diuretic therapy and follow-up in 1 week.  Her children state that she has remained short of breath and now has a cough which is nonproductive.  He also states that she is very weak when compared to her baseline.  She denies having any fever or chills.  Upon arrival to the ER she was noted to be hypoxic with room air pulse oximetry of 85% and was placed on 3 L of oxygen.  She also had diffuse wheezes. Family states that she has not had any difficulty swallowing and that she is on a regular diet at home.  Following her discharge speech had recommended mechanical soft diet with thin liquids which patient received while at rehab but since she has been home she has been on a regular diet.  Daughter notes difficulty swallowing large pills but otherwise states that she is able to tolerate her diet. They also note that since the stroke she has word finding difficulties.  Unable to do a review of systems on this  patient  Review of Systems: unable to review all systems due to the inability of the patient to answer questions. Past Medical History:  Diagnosis Date   A-fib [facility])    CHF (congestive heart failure) ([place])    Diabetes mellitus without complication ([place])    Hypertension    Past Surgical History:  Procedure Laterality Date   AMPUTATION Right 10/10/2019   Procedure: AMPUTATION ABOVE KNEE;  Surgeon: Katha Cabal, MD;  Location: [facility] ORS;  Service: Vascular;  Laterality: Right;   CHOLECYSTECTOMY     LOWER EXTREMITY ANGIOGRAPHY Right 08/12/2017   Procedure: LOWER EXTREMITY ANGIOGRAPHY;  Surgeon: Katha Cabal, MD;  Location: [place] [facility];  Service: Cardiovascular;  Laterality: Right;   LOWER EXTREMITY ANGIOGRAPHY Right 06/13/2018   Procedure: LOWER EXTREMITY ANGIOGRAPHY;  Surgeon: Katha Cabal, MD;  Location: [place] [facility];  Service: Cardiovascular;  Laterality: Right;   LOWER EXTREMITY ANGIOGRAPHY Right 03/01/2019   Procedure: LOWER EXTREMITY ANGIOGRAPHY;  Surgeon: Katha Cabal, MD;  Location: [place] [facility];  Service: Cardiovascular;  Laterality: Right;   LOWER EXTREMITY ANGIOGRAPHY Right 07/31/2019   Procedure: LOWER EXTREMITY ANGIOGRAPHY;  Surgeon: Katha Cabal, MD;  Location: [place] [facility];  Service: Cardiovascular;  Laterality: Right;   LOWER EXTREMITY ANGIOGRAPHY Right 10/04/2019   Procedure: Lower Extremity Angiography;  Surgeon: Katha Cabal, MD;  Location: [place] [facility];  Service: Cardiovascular;  Laterality: Right;   Social History:  reports that she has never smoked. She has never used smokeless tobacco. She  reports that she does not drink alcohol and does not use drugs.  Allergies  Allergen Reactions   Sulfa Antibiotics Hives    Family History  Problem Relation Age of Onset   Cancer Brother     Prior to Admission medications   Medication Sig Start Date End follow-up Taking? Authorizing Provider   acetaminophen (TYLENOL) 325 MG tablet Take 325-650 mg by mouth every 6 (six) hours as needed for headache.    Yes [provider]  amLODipine (NORVASC) 10 MG tablet Take 1 tablet (10 mg total) by mouth daily. 04/26/21  Yes Danford, Suann Larry, MD  atorvastatin (LIPITOR) 40 MG tablet Take 1 tablet (40 mg total) by mouth daily. 04/26/21  Yes Danford, Suann Larry, MD  calcium carbonate (OSCAL) 1500 (600 Ca) MG TABS tablet Take by mouth daily with breakfast.   Yes [provider]  Calcium Carbonate-Vitamin D 600-5 MG-MCG TABS Take 1 tablet by mouth daily. 05/20/21  Yes [provider]  ELIQUIS 2.5 MG TABS tablet Take 2.5 mg by mouth 2 (two) times daily. 05/20/21  Yes [provider]  furosemide (LASIX) 20 MG tablet Take 20 mg by mouth daily.   Yes [provider]  insulin lispro (HUMALOG) 100 UNIT/ML KwikPen Inject into the skin. 05/19/21  Yes [provider]  levothyroxine (SYNTHROID, LEVOTHROID) 88 MCG tablet Take 88 mcg by mouth daily before breakfast.  05/26/17  Yes [provider]  lisinopril (ZESTRIL) 5 MG tablet Take 5 mg by mouth daily.  12/22/18  Yes [provider]  mirtazapine (REMERON) 15 MG tablet Take 1 tablet by mouth at bedtime. 05/20/21  Yes [provider]  Multiple Vitamins-Minerals (PRESERVISION AREDS 2 PO) Take 1 tablet by mouth every evening.   Yes [provider]  nystatin (MYCOSTATIN/NYSTOP) powder Apply topically. 05/15/21  Yes [provider]  ACCU-CHEK AVIVA PLUS test strip  04/24/19   [provider]  aspirin EC 81 MG EC tablet Take 1 tablet (81 mg total) by mouth daily. Patient not taking: Reported on 05/30/2021 08/02/19   Stegmayer, Joelene Millin A, PA-C  carvedilol (COREG) 3.125 MG tablet Take 3.125 mg by mouth 2 (two) times daily. Patient not taking: Reported on 05/30/2021 03/04/21   [provider]  HUMALOG KWIKPEN 100 UNIT/ML KwikPen Inject 3 Units into the skin 3  (three) times daily after meals. Hold if eats <50% of meal 04/25/21   Danford, Suann Larry, MD  mirtazapine (REMERON) 15 MG tablet Take by mouth. 11/21/19 12/01/20  [provider]  Vitamin D3 (VITAMIN D) 25 MCG tablet Take 1,000 Units by mouth daily.  Patient not taking: Reported on 05/30/2021    [provider]    Physical Exam: Vitals:   05/30/21 0856 05/30/21 0930 05/30/21 1048 05/30/21 1130  BP: (!) 156/77  (!) 146/62 (!) 109/47  Pulse:  78 72 (!) 59  Resp:  (!) 22 (!) 26 20  Temp:      TempSrc:      SpO2: 96% 98% 99% 100%  Weight:      Height:       Physical Exam Vitals and nursing note reviewed.  Constitutional:      Appearance: She is well-developed and normal weight. She is ill-appearing.  HENT:     Head: Normocephalic and atraumatic.  Eyes:     Pupils: Pupils are equal, round, and reactive to light.  Cardiovascular:     Rate and Rhythm: Normal rate and regular rhythm.  Pulmonary:  Effort: Tachypnea present.     Breath sounds: Examination of the right-upper field reveals rhonchi. Examination of the right-middle field reveals rhonchi. Examination of the right-lower field reveals rhonchi. Rhonchi present.  Abdominal:     General: Bowel sounds are normal.     Palpations: Abdomen is soft.  Musculoskeletal:     Cervical back: Normal range of motion and neck supple.     Left lower leg: Edema present.  Skin:    General: Skin is warm and dry.  Neurological:     Mental Status: She is alert and oriented to person, place, and time.  Psychiatric:        Mood and Affect: Mood normal.        Behavior: Behavior normal.     Data Reviewed: Notes from primary care and specialist visits, past discharge summaries. Prior diagnostic testing as applicable to current admission diagnoses Updated medications and problem lists for reconciliation ED course, including vitals, labs, imaging, treatment and response to treatment Triage notes and ED providers  notes Labs reviewed.  Troponin 63 >> 62, lactic acid 1.1, procalcitonin 0.11 VBG reviewed and within normal limits Glucose 230, BUN 27, creatinine 1.78 white count 12.7 Respiratory viral panel is negative Chest x-ray reviewed by me shows extensive opacities throughout the right lung concerning for pneumonia.  Small right pleural effusion. Twelve-lead EKG reviewed by me shows sinus rhythm with PVCs There are no new results to review at this time.  Assessment and Plan: Principal Problem:   Pneumonia Active Problems:   PAD (peripheral artery disease) (HCC)   Type 2 diabetes mellitus with stage 4 chronic kidney disease, without long-term current use of insulin (HCC)   Hypothyroidism   Mild aortic valve stenosis   DNR (do not resuscitate)   Phantom pain after amputation of lower extremity ([place])   Acute respiratory failure with hypoxia (HCC)   Paroxysmal atrial fibrillation ([place])    Pneumonia with acute respiratory failure  Possible community-acquired pneumonia to rule out aspiration Patient noted to have extensive opacities throughout the right lung We will place patient empirically on Rocephin and Zithromax Consult speech therapy for swallow function evaluation Patient was hypoxic upon arrival to the ER with room air pulse oximetry of 85% and associated tachypnea and is currently on 3 L of oxygen. Continue oxygen supplementation to maintain pulse oximetry greater than 92% Attempt to wean off oxygen as tolerated We will need to be assessed for home oxygen need prior to discharge     Paroxysmal atrial fibrillation Continue Eliquis as secondary prophylaxis for an acute stroke    History of CVA with residual dysarthria and right-sided hemiparesis Continue statins and apixaban     Diabetes mellitus with complications of stage IV chronic kidney disease Maintain consistent carbohydrate diet Glycemic control with sliding scale insulin     Hypothyroidism Continue  Synthroid   Hypertension Continue amlodipine and lisinopril    Peripheral arterial disease Status post right AKA Continue statins      Advance Care Planning:   Code Status: Prior DO NOT RESUSCITATE  Consults: Speech therapy  Family Communication: Greater than 50% of time was spent discussing patient's condition and plan of care with her children at the bedside.  All questions and concerns have been addressed.  They verbalized understanding and agree with the plan.  CODE STATUS was discussed and she is a DO NOT RESUSCITATE.  Severity of Illness: The appropriate patient status for this patient is INPATIENT. Inpatient status is judged to be reasonable and  necessary in order to provide the required intensity of service to ensure the patient's safety. The patient's presenting symptoms, physical exam findings, and initial radiographic and laboratory data in the context of their chronic comorbidities is felt to place them at high risk for further clinical deterioration. Furthermore, it is not anticipated that the patient will be medically stable for discharge from the hospital within 2 midnights of admission.   * I certify that at the point of admission it is my clinical judgment that the patient will require inpatient hospital care spanning beyond 2 midnights from the point of admission due to high intensity of service, high risk for further deterioration and high frequency of surveillance required.*  Author: Collier Bullock, MD 05/30/2021 12:27 PM  For on call review [email or web address].

## 2021-05-30 NOTE — ED Provider Notes (Signed)
[facility] Provider Note    Event Date/Time   First MD Initiated Contact with Patient 05/30/21 [phone]     (approximate)   History   Shortness of Breath   HPI  [name] is a 88 y.o. female with a past medical history of paroxysmal A-fib not anticoagulated, diastolic heart failure, DM, PAD status post right AKA, CKD, HDL, and HTN as well as recent admission in January of this year for management of an acute stroke presenting with aphasia status post tPA who presents from home for evaluation of reported worsening cough and shortness of breath associate with generalized weakness and malaise.  He is accompanied by family who assist in providing significant amount of history secondary to patient's aphasia from her stroke.  They do feel that she has been a little more confused than usual and she is able to state that she has a headache but clear acute pain associate with a cough and shortness of breath..  Per family bedside she has not been complaining of any pain to them has not had any clear vomiting, diarrhea rash or recent injuries or falls.        Physical Exam  Triage Vital Signs: ED Triage Vitals [05/30/21 0852]  Enc Vitals Group     BP      Pulse      Resp      Temp      Temp src      SpO2      Weight 127 lb 8 oz (57.8 kg)     Height 5\' 2"  (1.575 m)     Head Circumference      Peak Flow      Pain Score 0     Pain Loc      Pain Edu?      Excl. in [place]?     Most recent vital signs: Vitals:   05/30/21 0930 05/30/21 1048  BP:  (!) 146/62  Pulse: 78 72  Resp: (!) 22 (!) 26  Temp:    SpO2: 98% 99%    General: Awake, ill-appearing CV:  Good peripheral perfusion.  Systolic murmur. Resp:  Normal effort.  Fairly tachypneic with some index.  Wheezing and bilateral rhonchi. Abd:  No distention.  Soft Other:  PERRLA.  EOMI.  Patient is able to move her right upper extremities and left lower extremity on command although she does not otherwise  participate in neurological exam and is not oriented.  No family at bedside since coming home from rehab couple weeks ago they feel that she has been slowly declining and is usually much more awake and alert and able to follow commands much more easily.   ED Results / Procedures / Treatments  Labs (all labs ordered are listed, but only abnormal results are displayed) Labs Reviewed  COMPREHENSIVE METABOLIC PANEL - Abnormal; Notable for the following components:      Result Value   Glucose, Bld 230 (*)    BUN 27 (*)    Creatinine, Ser 1.78 (*)    Albumin 3.4 (*)    AST 45 (*)    GFR, Estimated 26 (*)    All other components within normal limits  CBC WITH DIFFERENTIAL/PLATELET - Abnormal; Notable for the following components:   WBC 12.7 (*)    Neutro Abs 9.8 (*)    Monocytes Absolute 1.6 (*)    All other components within normal limits  PROTIME-INR - Abnormal; Notable for the following components:  Prothrombin Time 16.1 (*)    INR 1.3 (*)    All other components within normal limits  URINALYSIS, COMPLETE (UACMP) WITH MICROSCOPIC - Abnormal; Notable for the following components:   Color, Urine YELLOW (*)    APPearance CLOUDY (*)    Glucose, UA 150 (*)    Protein, ur >=300 (*)    All other components within normal limits  BLOOD GAS, VENOUS - Abnormal; Notable for the following components:   pO2, Ven 137 (*)    All other components within normal limits  TROPONIN I (HIGH SENSITIVITY) - Abnormal; Notable for the following components:   Troponin I (High Sensitivity) 63 (*)    All other components within normal limits  RESP PANEL BY RT-PCR (FLU A&B, COVID) ARPGX2  CULTURE, BLOOD (ROUTINE X 2)  CULTURE, BLOOD (ROUTINE X 2)  EXPECTORATED SPUTUM ASSESSMENT W GRAM STAIN, RFLX TO RESP C  LACTIC ACID, PLASMA  APTT  PROCALCITONIN  LACTIC ACID, PLASMA  BRAIN NATRIURETIC PEPTIDE  TROPONIN I (HIGH SENSITIVITY)     EKG  ECG is remarkable sinus rhythm with a ventricular rate of 81,  first-degree block with a period of all of 250, PVCs versus artifact and some nonspecific changes versus artifact in multiple leads.   RADIOLOGY CT head interpreted by myself with no evidence of hemorrhage or other clear acute process.  I reviewed radiology interpretation and agree with the findings of some chronic small vessel ischemic disease and atrophy with evidence of chronic left basal ganglia infarct.  No other acute process noted by radiology.  Chest x-ray reviewed by myself shows bilateral opacities concerning for pneumonia with small bilateral pleural effusions as well.  No pneumothorax or overt edema or other clear process.  Also reviewed radiology findings and agree with their interpretation.   PROCEDURES:  Critical Care performed: Yes, see critical care procedure note(s)  .1-3 Lead EKG Interpretation Performed by: Lucrezia Starch, MD Authorized by: Lucrezia Starch, MD     Interpretation: normal     ECG rate assessment: normal     Rhythm: sinus rhythm     Ectopy: PVCs     Conduction: abnormal     Abnormal conduction: 1st degree AV block   .Critical Care Performed by: Lucrezia Starch, MD Authorized by: Lucrezia Starch, MD   Critical care provider statement:    Critical care time (minutes):  30   Critical care was necessary to treat or prevent imminent or life-threatening deterioration of the following conditions:  Sepsis and respiratory failure   Critical care was time spent personally by me on the following activities:  Development of treatment plan with patient or surrogate, discussions with consultants, evaluation of patient's response to treatment, examination of patient, ordering and review of laboratory studies, ordering and review of radiographic studies, ordering and performing treatments and interventions, pulse oximetry, re-evaluation of patient's condition and review of old charts  The patient is on the cardiac monitor to evaluate for evidence of arrhythmia  and/or significant heart rate changes.   MEDICATIONS ORDERED IN ED: Medications  vancomycin (VANCOREADY) IVPB 1500 mg/300 mL (1,500 mg Intravenous New Bag/Given 05/30/21 1042)  ipratropium-albuterol (DUONEB) 0.5-2.5 (3) MG/3ML nebulizer solution 9 mL (9 mLs Nebulization Given 05/30/21 1043)  ceFEPIme (MAXIPIME) 2 g in sodium chloride 0.9 % 100 mL IVPB (0 g Intravenous Stopped 05/30/21 1027)  aspirin chewable tablet 324 mg (324 mg Oral Given 05/30/21 1040)     IMPRESSION / MDM / ASSESSMENT AND PLAN / ED COURSE  I reviewed the triage vital signs and the nursing notes.                              With regard to patient's shortness of breath and hypoxia noted on triage and improved to SPO2 of 96% on 3 L nasal cannula differential considerations include ACS, PE, pneumonia, bronchitis, pneumothorax, anemia, metabolic derangements, aspiration and CHF.  This certainly could be contributing to some weakness and slight increase in confusion versus aphasia from baseline.  Given patient is less than 3 months out from tPA will obtain a CT head to rule out acute subarachnoid hemorrhage as well.  We will also obtain a UA to assess for urinary source of infection that could be contributing to some confusion.  ECG is remarkable sinus rhythm with a ventricular rate of 81, first-degree block with a period of all of 250, PVCs versus artifact and some nonspecific changes versus artifact in multiple leads.  CT head interpreted by myself with no evidence of hemorrhage or other clear acute process.  I reviewed radiology interpretation and agree with the findings of some chronic small vessel ischemic disease and atrophy with evidence of chronic left basal ganglia infarct.  No other acute process noted by radiology.  Chest x-ray reviewed by myself shows bilateral opacities concerning for pneumonia with small bilateral pleural effusions as well.  No pneumothorax or overt edema or other clear process.  Also reviewed  radiology findings and agree with their interpretation.   CMP shows a glucose of 230 and a creatinine of 1.78 compared to 1.55-month ago without other significant electrolyte or metabolic derangements.  CBC shows WC count of 12.7 without evidence of acute anemia.  Coagulation studies are unremarkable.  Troponin is elevated at 63 and I suspect some demand ischemia in the setting of sepsis and respiratory failure from pneumonia with lower suspicion for an occlusion MI at this time.  We will treat with aspirin for heparin pending repeat troponin.  Procalcitonin 0.11.  COVID influenza PCR is negative.  Lactic acid is not elevated.  UA has some protein and glucose but no evidence of infection.  VBG with a pH of 7.37 and PCO2 of 48 is not suggestive of hypercarbic respiratory failure.  Blood cultures obtained and patient started on broad-spectrum antibiotics with concern for sepsis given tachypnea and leukocytosis with respiratory failure.  I will plan admit to medicine service for further evaluation and management.      FINAL CLINICAL IMPRESSION(S) / ED DIAGNOSES   Final diagnoses:  Healthcare-associated pneumonia  NSTEMI (non-ST elevated myocardial infarction) ([place])  Sepsis, due to unspecified organism, unspecified whether acute organ dysfunction present ([place])  Acute respiratory failure with hypoxia ([place])     Rx / DC Orders   ED Discharge Orders     None        Note:  This document was prepared using Dragon voice recognition software and may include unintentional dictation errors.   Lucrezia Starch, MD 05/30/21 1055

## 2021-05-30 NOTE — ED Triage Notes (Signed)
Pt via POV from home. Pt c/o weakness and SOB for the past 2 days. Per family, pt increased SOB and weakness. Pt is wheezing on arrival. Denies any pain. Pt is A&OX4. Pt 85% on RA, placed on 3L [place] and increased to 96%. Pt has a hx of RLE amputation.

## 2021-05-30 NOTE — Plan of Care (Signed)
Care plan reviewed with pt family

## 2021-05-30 NOTE — ED Notes (Signed)
I&O cath was done by this tech and EDT, Gabby assisted.

## 2021-05-30 NOTE — Plan of Care (Signed)
Care plan reviewed with family.

## 2021-05-30 NOTE — Consult Note (Signed)
PHARMACY -  BRIEF ANTIBIOTIC NOTE   Pharmacy has received consult(s) for PNA from an ED provider.  The patient's profile has been reviewed for ht/wt/allergies/indication/available labs.    One time order(s) placed for cefepime and vancomycin  Further antibiotics/pharmacy consults should be ordered by admitting physician if indicated.                       Thank you, Oswald Hillock 05/30/2021  9:40 AM

## 2021-05-30 NOTE — Progress Notes (Signed)
Epic handoff

## 2021-05-30 NOTE — ED Notes (Signed)
Mara RN aware of assigned bed °

## 2021-05-31 ENCOUNTER — Inpatient Hospital Stay: Payer: Medicare Other

## 2021-05-31 ENCOUNTER — Encounter: Payer: Self-pay | Admitting: Internal Medicine

## 2021-05-31 DIAGNOSIS — N1831 Chronic kidney disease, stage 3a: Secondary | ICD-10-CM

## 2021-05-31 DIAGNOSIS — I5033 Acute on chronic diastolic (congestive) heart failure: Secondary | ICD-10-CM | POA: Insufficient documentation

## 2021-05-31 LAB — BLOOD GAS, VENOUS
Acid-base deficit: 0.7 mmol/L (ref 0.0–2.0)
Bicarbonate: 25.8 mmol/L (ref 20.0–28.0)
O2 Saturation: 100 %
Patient temperature: 37
pCO2, Ven: 49 mmHg (ref 44–60)
pH, Ven: 7.33 (ref 7.25–7.43)
pO2, Ven: 162 mmHg — ABNORMAL HIGH (ref 32–45)

## 2021-05-31 LAB — CBC
HCT: 37.8 % (ref 36.0–46.0)
Hemoglobin: 11.9 g/dL — ABNORMAL LOW (ref 12.0–15.0)
MCH: 30.5 pg (ref 26.0–34.0)
MCHC: 31.5 g/dL (ref 30.0–36.0)
MCV: 96.9 fL (ref 80.0–100.0)
Platelets: 225 10*3/uL (ref 150–400)
RBC: 3.9 MIL/uL (ref 3.87–5.11)
RDW: 12.4 % (ref 11.5–15.5)
WBC: 10.6 10*3/uL — ABNORMAL HIGH (ref 4.0–10.5)
nRBC: 0 % (ref 0.0–0.2)

## 2021-05-31 LAB — GLUCOSE, CAPILLARY
Glucose-Capillary: 139 mg/dL — ABNORMAL HIGH (ref 70–99)
Glucose-Capillary: 113 mg/dL — ABNORMAL HIGH (ref 70–99)
Glucose-Capillary: 129 mg/dL — ABNORMAL HIGH (ref 70–99)
Glucose-Capillary: 104 mg/dL — ABNORMAL HIGH (ref 70–99)
Glucose-Capillary: 165 mg/dL — ABNORMAL HIGH (ref 70–99)
Glucose-Capillary: 224 mg/dL — ABNORMAL HIGH (ref 70–99)

## 2021-05-31 LAB — BODY FLUID CELL COUNT WITH DIFFERENTIAL
Lymphs, Fluid: 28 %
Monocyte-Macrophage-Serous Fluid: 55 %
Neutrophil Count, Fluid: 17 %
Total Nucleated Cell Count, Fluid: 856 cu mm

## 2021-05-31 LAB — TSH: TSH: 0.967 u[IU]/mL (ref 0.350–4.500)

## 2021-05-31 LAB — MRSA NEXT GEN BY PCR, NASAL: MRSA by PCR Next Gen: NOT DETECTED

## 2021-05-31 LAB — BASIC METABOLIC PANEL
Anion gap: 10 (ref 5–15)
BUN: 31 mg/dL — ABNORMAL HIGH (ref 8–23)
CO2: 24 mmol/L (ref 22–32)
Calcium: 8.9 mg/dL (ref 8.9–10.3)
Chloride: 112 mmol/L — ABNORMAL HIGH (ref 98–111)
Creatinine, Ser: 1.69 mg/dL — ABNORMAL HIGH (ref 0.44–1.00)
GFR, Estimated: 28 mL/min — ABNORMAL LOW (ref >60)
Glucose, Bld: 143 mg/dL — ABNORMAL HIGH (ref 70–99)
Potassium: 4.4 mmol/L (ref 3.5–5.1)
Sodium: 146 mmol/L — ABNORMAL HIGH (ref 135–145)

## 2021-05-31 LAB — BRAIN NATRIURETIC PEPTIDE: B Natriuretic Peptide: 601.3 pg/mL — ABNORMAL HIGH (ref 0.0–100.0)

## 2021-05-31 LAB — PROCALCITONIN
Procalcitonin: 0.23 ng/mL
Procalcitonin: <0.1 ng/mL

## 2021-05-31 LAB — GLUCOSE, PLEURAL OR PERITONEAL FLUID: Glucose, Fluid: 133 mg/dL

## 2021-05-31 LAB — LACTATE DEHYDROGENASE, PLEURAL OR PERITONEAL FLUID: LD, Fluid: 58 U/L — ABNORMAL HIGH (ref 3–23)

## 2021-05-31 LAB — C-REACTIVE PROTEIN: CRP: 18.8 mg/dL — ABNORMAL HIGH (ref <1.0)

## 2021-05-31 MED ORDER — METOPROLOL TARTRATE 5 MG/5ML IV SOLN: 5.0000 mg | INTRAVENOUS | Status: DC | PRN

## 2021-05-31 MED ORDER — LISINOPRIL 2.5 MG PO TABS
2.5000 mg | ORAL_TABLET | Freq: Every day | ORAL | Status: DC
  Administered 2021-06-01: 2.5 mg via ORAL
  Filled 2021-05-31 (×2): qty 1

## 2021-05-31 MED ORDER — IPRATROPIUM-ALBUTEROL 0.5-2.5 (3) MG/3ML IN SOLN
3.0000 mL | Freq: Two times a day (BID) | RESPIRATORY_TRACT | Status: DC
Start: 2021-05-31 — End: 2021-06-03
  Administered 2021-05-31 – 2021-06-03 (×6): 3 mL via RESPIRATORY_TRACT
  Filled 2021-05-31 (×6): qty 3

## 2021-05-31 MED ORDER — HYDRALAZINE HCL 20 MG/ML IJ SOLN
10.0000 mg | INTRAMUSCULAR | Status: DC | PRN
Start: 2021-05-31 — End: 2021-06-03

## 2021-05-31 MED ORDER — FUROSEMIDE 10 MG/ML IJ SOLN
20.0000 mg | Freq: Two times a day (BID) | INTRAMUSCULAR | Status: DC
  Administered 2021-05-31 – 2021-06-02 (×5): 20 mg via INTRAVENOUS
  Filled 2021-05-31 (×6): qty 4

## 2021-05-31 MED ORDER — SENNOSIDES-DOCUSATE SODIUM 8.6-50 MG PO TABS
1.0000 | ORAL_TABLET | Freq: Every evening | ORAL | Status: DC | PRN
Start: 2021-05-31 — End: 2021-06-03
  Filled 2021-05-31: qty 1

## 2021-05-31 MED ORDER — IPRATROPIUM-ALBUTEROL 0.5-2.5 (3) MG/3ML IN SOLN
3.0000 mL | RESPIRATORY_TRACT | Status: DC | PRN
Start: 2021-05-31 — End: 2021-06-01

## 2021-05-31 NOTE — Assessment & Plan Note (Signed)
CODE STATUS confirmed-DNR

## 2021-05-31 NOTE — Progress Notes (Signed)
Cross Cover Patient with increased oxygen needs.  Chest xray with worsening pneumonia  VBG pending Bedside patient is frail, alert, sats stable. She admits breathing better when sat up in bed, repositioned, however, overall she has poor inspiratory effort, and very weak cough with difficulty with expectoration of sputum

## 2021-05-31 NOTE — Evaluation (Signed)
Physical Therapy Evaluation Patient Details Name: [name] MRN: [id number] DOB: 11-19-1925 Today's Date: 05/31/2021  History of Present Illness  Pt is a 88 y/o F admitted on 05/30/21 after presenting with c/c of SOB. Chest x-ray shows extensive opacities throughout the right lung concerning for pneumonia, small right pleural effusion. Pt is being tx for PNA with ARF. PMH: CVA with R sided hemiparesis, PAD s/p R AKA, DM, stage IV CKD, HTN, dyslipidemia, a-fib, CHF  Clinical Impression  Pt seen for PT evaluation with family (son, [name], and daughter, [name]) present, reporting pt was doing well upon return home from [facility] as she was able to perform bed mobility & bed<>w/c transfers without physical assistance. On this date, pt is agreeable to sitting EOB but requires mod assist for supine>sit with reliance on HOB elevated & bed rails. Pt tolerates sitting EOB ~12 minutes with close supervision while engaging in LLE exercises. Pt is able to initiate scooting to R along EOB but reports fatigue & ultimately requires max assist. Pt's children are eager to take pt home vs STR again - discussed recommendations of hospital bed, manual hoyer lift, & HHPT f/u & children are agreeable. Will continue to follow pt acutely to address balance, endurance and bed<>chair transfers.        Recommendations for follow up therapy are one component of a multi-disciplinary discharge planning process, led by the attending physician.  Recommendations may be updated based on patient status, additional functional criteria and insurance authorization.  Follow Up Recommendations Home health PT    Assistance Recommended at Discharge Frequent or constant Supervision/Assistance  Patient can return home with the following  A lot of help with bathing/dressing/bathroom;Two people to help with walking and/or transfers;Assistance with cooking/housework;Direct supervision/assist for financial management;Assist for transportation;Help with  stairs or ramp for entrance;Direct supervision/assist for medications management;Assistance with feeding    Equipment Recommendations  (hospital bed, manual hoyer lift)  Recommendations for Other Services       Functional Status Assessment Patient has had a recent decline in their functional status and demonstrates the ability to make significant improvements in function in a reasonable and predictable amount of time.     Precautions / Restrictions Precautions Precautions: Fall Precaution Comments: old R AKA Restrictions Weight Bearing Restrictions: No      Mobility  Bed Mobility Overal bed mobility: Needs Assistance Bed Mobility: Rolling, Supine to Sit, Sit to Supine Rolling: Supervision (with bed rails to L & R)   Supine to sit: Mod assist, HOB elevated (assistance to pivot hips towards EOB, pt holds to PT's hand to pull trunk upright, use of bed rails & HOB almost fully elevated) Sit to supine: Min assist        Transfers                        Ambulation/Gait                  Stairs            Wheelchair Mobility    Modified Rankin (Stroke Patients Only)       Balance Overall balance assessment: Needs assistance Sitting-balance support: Feet supported, Bilateral upper extremity supported Sitting balance-[name] Scale: Fair Sitting balance - Comments: close supervision static sitting EOB  Pertinent Vitals/Pain Pain Assessment Pain Assessment: No/denies pain    Home Living Family/patient expects to be discharged to:: Private residence Living Arrangements: Children Available Help at Discharge: Family;Available 24 hours/day Type of Home: House Home Access: Ramped entrance       Home Layout: One level Home Equipment: Wheelchair - manual;BSC/3in1      Prior Function               Mobility Comments: Pt recently returned home from SN[facility] rehab & pt was able to perform bed  mobility & bed<>w/c transfers without physical assistance until pt began experiencing RLE pain 2/2 infection, followed by current medical issues that has caused her to be admitted.       Hand Dominance        Extremity/Trunk Assessment   Upper Extremity Assessment Upper Extremity Assessment: Generalized weakness    Lower Extremity Assessment Lower Extremity Assessment: Generalized weakness (old R AKA)    Cervical / Trunk Assessment Cervical / Trunk Assessment: Kyphotic (rounded shoulders, forward head)  Communication   Communication: No difficulties  Cognition Arousal/Alertness: Awake/alert Behavior During Therapy: WFL for tasks assessed/performed Overall Cognitive Status: Within Functional Limits for tasks assessed                                 General Comments: sweet lady        General Comments General comments (skin integrity, edema, etc.): SpO2 >90% throughout session on supplemental O2.    Exercises General Exercises - Lower Extremity Long Arc Quad: AROM, Strengthening, Left, 10 reps, Seated (x 2 sets)   Assessment/Plan    PT Assessment Patient needs continued PT services  PT Problem List Decreased strength;Decreased mobility;Decreased activity tolerance;Decreased balance;Cardiopulmonary status limiting activity       PT Treatment Interventions DME instruction;Therapeutic exercise;Balance training;Wheelchair mobility training;Neuromuscular re-education;Functional mobility training;Therapeutic activities;Patient/family education    PT Goals (Current goals can be found in the Care Plan section)  Acute Rehab PT Goals Patient Stated Goal: regain as much independence as possible PT Goal Formulation: With patient/family Time For Goal Achievement: 06/14/21 Potential to Achieve Goals: Fair    Frequency Min 2X/week     Co-evaluation               AM-PAC PT "6 Clicks" Mobility  Outcome Measure Help needed turning from your back to your  side while in a flat bed without using bedrails?: A Little Help needed moving from lying on your back to sitting on the side of a flat bed without using bedrails?: Total Help needed moving to and from a bed to a chair (including a wheelchair)?: Total Help needed standing up from a chair using your arms (e.g., wheelchair or bedside chair)?: Total Help needed to walk in hospital room?: Total Help needed climbing 3-5 steps with a railing? : Total 6 Click Score: 8    End of Session Equipment Utilized During Treatment: Oxygen Activity Tolerance: Patient tolerated treatment well;Patient limited by fatigue Patient left: in bed;with bed alarm set;with call bell/phone within reach;with family/visitor present (in chair position with 4 rails up with family agreeable to this) Nurse Communication: Mobility status PT Visit Diagnosis: Muscle weakness (generalized) (M62.81);Other abnormalities of gait and mobility (R26.89)    Time: 4315-4008 PT Time Calculation (min) (ACUTE ONLY): 22 min   Charges:   PT Evaluation $PT Eval Moderate Complexity: 1 Mod PT Treatments $Therapeutic Activity: 8-22 mins  Lavone Nian, PT, DPT 05/31/21, 3:39 PM   Waunita Schooner 05/31/2021, 3:37 PM

## 2021-05-31 NOTE — Progress Notes (Signed)
OT Cancellation Note  Patient Details Name: [name] MRN: [id number] DOB: 01-20-1926   Cancelled Treatment:    Reason Eval/Treat Not Completed: Other (comment);Patient at procedure or test/ unavailable (pt off floor for procedure, OT will re-attempt as able)  Shanon Payor, OTD OTR/L  05/31/21, 1:52 PM

## 2021-05-31 NOTE — Assessment & Plan Note (Signed)
On Eliquis and statin.  Has history of AKA

## 2021-05-31 NOTE — Progress Notes (Signed)
Pt placed on hfnc due to desats on standard [place].  O2 sat currently 92% on 15 lpm. Pt has moist cough with diffuse coarse crackles.  RN notified.

## 2021-05-31 NOTE — Assessment & Plan Note (Signed)
Secondary to history of AKA

## 2021-05-31 NOTE — Progress Notes (Signed)
SLP Cancellation Note  Patient Details Name: [name] MRN: [id number] DOB: 1926-02-29   Cancelled treatment:       Reason Eval/Treat Not Completed: Medical issues which prohibited therapy   Per MD, pt NPO for thoracentesis. Will re-attempt clinical swallowing evaluation later today, as able.  Cherrie Gauze, M.S., [facility] [phone])  Quintella Baton 05/31/2021, 10:25 AM

## 2021-05-31 NOTE — Progress Notes (Signed)
SLP Cancellation Note  Patient Details Name: Verlyn Dannenberg Ruffini MRN: [id number] DOB: [birth year]   Cancelled treatment:       Reason Eval/Treat Not Completed: Medical issues which prohibited therapy;Patient at procedure or test/unavailable (Pt OTF for thoracentesis. Will attempt clinical swallowing evaluation next date, as appropriate.)  Cherrie Gauze, M.S., [facility] [phone] ([place])  Quintella Baton 05/31/2021, 1:21 PM

## 2021-05-31 NOTE — Progress Notes (Signed)
Respiratory therapy called to room because patient's O2 saturation was 85% on 6L [place].  I placed a non-rebreather on and oxygen went up to 98%.  Respiratory placed patient on HFNC and Sharion Settler was notified.  Chest xray and labs were ordered.  Will continue to monitor.  Christene Slates

## 2021-05-31 NOTE — Assessment & Plan Note (Signed)
Daily Lipitor

## 2021-05-31 NOTE — Assessment & Plan Note (Addendum)
Cont IV lasix. Norvasc held Lisinopril reduced 2.5mg  po daily

## 2021-05-31 NOTE — Assessment & Plan Note (Addendum)
Echocardiogram in January 2020 showed EF of 55%.  Elevated BNP, status post thoracentesis 2/19, 650 cc removed. Continue Lasix 20 mg IV twice daily.  Seems to be responding well.

## 2021-05-31 NOTE — Assessment & Plan Note (Signed)
Creatinine is around baseline of 1.6.  Continue to closely monitor this.

## 2021-05-31 NOTE — Assessment & Plan Note (Signed)
Seen on previous echocardiogram.  Currently chest pain-free.  Not symptomatic

## 2021-05-31 NOTE — Assessment & Plan Note (Addendum)
CT of the chest shows multifocal infection, moderate right-sided pleural effusion, mediastinal lymphadenopathy and previously known 1.6 cm right-sided thyroid nodule Currently on 2 L nasal cannula.  We will attempt to wean this off. ProCal 0.23; BNP 600 On empiric IV Rocephin and azithromycin Bronchodilators Echocardiogram January 2022 showed EF 55% Seen by pulmonary.  Status post thoracentesis, 650 cc removed.  Transudative fluid.  No organisms seen on blood cultures.

## 2021-05-31 NOTE — Assessment & Plan Note (Signed)
Chronic.  Rate controlled Continue Eliquis

## 2021-05-31 NOTE — Progress Notes (Signed)
PROGRESS NOTE    [name]  JSE:[id number] DOB: [birth year] DOA: 05/30/2021 PCP: Sofie Hartigan, MD   Brief Narrative:  88 year old with history of recent CVA status post tPA, PAD status post right-sided AKA, DM 2, CKD stage IIIa, HTN, HLD admitted to the hospital for shortness of breath.  She was found to have acute respiratory distress likely from fluid overload, right-sided pleural effusion with possibly TOF multifocal pneumonia.  She was started on IV diuretics, pulmonary was consulted for thoracentesis.  She was empirically started on IV Rocephin and azithromycin as well.   Assessment & Plan:  Principal Problem:   Acute respiratory failure with hypoxia (HCC) Active Problems:   Pneumonia   Acute on chronic diastolic CHF (congestive heart failure) (HCC)   Stage 3a chronic kidney disease (CKD) (HCC)   PAD (peripheral artery disease) (HCC)   Type 2 diabetes mellitus with stage 4 chronic kidney disease, without long-term current use of insulin (HCC)   Hyperlipidemia   Essential hypertension   Hypothyroidism   Mild aortic valve stenosis   DNR (do not resuscitate)   Phantom pain after amputation of lower extremity ([place])   Paroxysmal atrial fibrillation ([place])     Assessment and Plan: * Acute respiratory failure with hypoxia ([place])- (present on admission) CT of the chest shows multifocal infection, moderate right-sided pleural effusion, mediastinal lymphadenopathy and previously known 1.6 cm right-sided thyroid nodule Currently on 7L [facility] ProCal 0.23; BNP 600 On empiric IV Rocephin and azithromycin Bronchodilators Echocardiogram January 2022 showed EF 55% Pulm Consulted for Thoracentesis  Stage 3a chronic kidney disease (CKD) (HCC) Creatinine is around baseline of 1.6.  Continue to closely monitor this.  Acute on chronic diastolic CHF (congestive heart failure) (HCC) Echocardiogram in January 2020 showed EF of 55%.  We will check BNP.  Does have right-sided pleural  effusion. Lasix  IV BID  Pneumonia- (present on admission) Multifocal pneumonia seen on the CT.  Currently on empiric IV Rocephin and Zosyn.  Bronchodilators.  Procal 0.23  Paroxysmal atrial fibrillation ([place])- (present on admission) Chronic.  Rate controlled Continue Eliquis  Phantom pain after amputation of lower extremity ([place])- (present on admission) Secondary to history of AKA  DNR (do not resuscitate)- (present on admission) CODE STATUS confirmed-DNR  Mild aortic valve stenosis Seen on previous echocardiogram.  Currently chest pain-free.  Not symptomatic  Hypothyroidism- (present on admission) Synthroid  Essential hypertension Cont IV lasix. Norvasc held Lisinopril reduced 2.5mg  po daily  Hyperlipidemia- (present on admission) Daily Lipitor  Type 2 diabetes mellitus with stage 4 chronic kidney disease, without long-term current use of insulin ([place])- (present on admission) Sliding scale and Accu-Cheks A1c 7.18 Apr 2921  PAD (peripheral artery disease) ([place])- (present on admission) On Eliquis and statin.  Has history of AKA          DVT prophylaxis: On Eliquis, on hold for now until thoracentesis Code Status: DNR Family Communication: Daughter is at the bedside  Status is: Inpatient Remains inpatient appropriate because: Patient is on 7 L nasal cannula.  Not on any home oxygen undergoing aggressive breathing treatment.  She is not safe for discharge.          Subjective: During my evaluation patient had dyspnea with minimal movement and orthopnea.  Did have some coughing.  Overall appears very weak.  She is a poor historian, daughter is present at bedside who is helping with her history.   Examination:  Constitutional: Not in acute distress, 7 L nasal cannula.  Elderly frail  Respiratory: Diffuse bilateral rhonchi with diminished breath sounds on the right side. Cardiovascular: Normal sinus rhythm, no rubs Abdomen: Nontender nondistended good bowel  sounds Musculoskeletal: No edema noted Skin: No rashes seen Neurologic: CN 2-12 grossly intact.  And nonfocal Psychiatric: Normal judgment and insight. Alert and oriented x 3. Normal mood. Objective: Vitals:   05/31/21 0732 05/31/21 0749 05/31/21 0752 05/31/21 0910  BP: (!) 112/58     Pulse: 61 (!) 52 64   Resp: 18 (!) 22 (!) 22   Temp: (!) 97.5 F (36.4 C)     TempSrc: Oral     SpO2: 99% 97% 96% 92%  Weight:      Height:        Intake/Output Summary (Last 24 hours) at 05/31/2021 1158 Last data filed at 05/31/2021 0509 Gross per 24 hour  Intake 910.41 ml  Output 350 ml  Net 560.41 ml   Filed Weights   05/30/21 0852  Weight: 57.8 kg     Data Reviewed:   CBC: Recent Labs  Lab 05/30/21 0904 05/31/21 0534  WBC 12.7* 10.6*  NEUTROABS 9.8*  --   HGB 13.3 11.9*  HCT 41.7 37.8  MCV 95.4 96.9  PLT 251 956   Basic Metabolic Panel: Recent Labs  Lab 05/30/21 0904 05/31/21 0534  NA 142 146*  K 4.3 4.4  CL 109 112*  CO2 24 24  GLUCOSE 230* 143*  BUN 27* 31*  CREATININE 1.78* 1.69*  CALCIUM 9.1 8.9   GFR: Estimated Creatinine Clearance: 16.4 mL/min (A) (by C-G formula based on SCr of 1.69 mg/dL (H)). Liver Function Tests: Recent Labs  Lab 05/30/21 0904  AST 45*  ALT 36  ALKPHOS 105  BILITOT 1.2  PROT 8.1  ALBUMIN 3.4*   No results for input(s): LIPASE, AMYLASE in the last 168 hours. No results for input(s): AMMONIA in the last 168 hours. Coagulation Profile: Recent Labs  Lab 05/30/21 0904  INR 1.3*   Cardiac Enzymes: No results for input(s): CKTOTAL, CKMB, CKMBINDEX, TROPONINI in the last 168 hours. BNP (last 3 results) No results for input(s): PROBNP in the last 8760 hours. HbA1C: No results for input(s): HGBA1C in the last 72 hours. CBG: Recent Labs  Lab 05/30/21 2011 05/30/21 2308 05/31/21 0412 05/31/21 0735 05/31/21 1127  GLUCAP 115* 111* 139* 113* 129*   Lipid Profile: No results for input(s): CHOL, HDL, LDLCALC, TRIG, CHOLHDL,  LDLDIRECT in the last 72 hours. Thyroid Function Tests: Recent Labs    05/31/21 0534  TSH 0.967   Anemia Panel: No results for input(s): VITAMINB12, FOLATE, FERRITIN, TIBC, IRON, RETICCTPCT in the last 72 hours. Sepsis Labs: Recent Labs  Lab 05/30/21 0904 05/30/21 0927 05/30/21 1106 05/31/21 0534  PROCALCITON 0.11  --   --  0.23  LATICACIDVEN  --  1.1 0.9  --     Recent Results (from the past 240 hour(s))  Resp Panel by RT-PCR (Flu A&B, Covid) Nasopharyngeal Swab     Status: None   Collection Time: 05/30/21  9:27 AM   Specimen: Nasopharyngeal Swab; Nasopharyngeal(NP) swabs in vial transport medium  Result Value Ref Range Status   SARS Coronavirus 2 by RT PCR NEGATIVE NEGATIVE Final    Comment: (NOTE) SARS-CoV-2 target nucleic acids are NOT DETECTED.  The SARS-CoV-2 RNA is generally detectable in upper respiratory specimens during the acute phase of infection. The lowest concentration of SARS-CoV-2 viral copies this assay can detect is 138 copies/mL. A negative result does not preclude SARS-Cov-2 infection and should not  be used as the sole basis for treatment or other patient management decisions. A negative result may occur with  improper specimen collection/handling, submission of specimen other than nasopharyngeal swab, presence of viral mutation(s) within the areas targeted by this assay, and inadequate number of viral copies(<138 copies/mL). A negative result must be combined with clinical observations, patient history, and epidemiological information. The expected result is Negative.  Fact Sheet for Patients:  EntrepreneurPulse.com.au  Fact Sheet for Healthcare Providers:  IncredibleEmployment.be  This test is no t yet approved or cleared by the [place] FDA and  has been authorized for detection and/or diagnosis of SARS-CoV-2 by FDA under an Emergency Use Authorization (EUA). This EUA will remain  in effect (meaning  this test can be used) for the duration of the COVID-19 declaration under Section 564(b)(1) of the Act, 21 U.S.C.section 360bbb-3(b)(1), unless the authorization is terminated  or revoked sooner.       Influenza A by PCR NEGATIVE NEGATIVE Final   Influenza B by PCR NEGATIVE NEGATIVE Final    Comment: (NOTE) The Xpert Xpress SARS-CoV-2/FLU/RSV plus assay is intended as an aid in the diagnosis of influenza from Nasopharyngeal swab specimens and should not be used as a sole basis for treatment. Nasal washings and aspirates are unacceptable for Xpert Xpress SARS-CoV-2/FLU/RSV testing.  Fact Sheet for Patients: EntrepreneurPulse.com.au  Fact Sheet for Healthcare Providers: IncredibleEmployment.be  This test is not yet approved or cleared by the [place] FDA and has been authorized for detection and/or diagnosis of SARS-CoV-2 by FDA under an Emergency Use Authorization (EUA). This EUA will remain in effect (meaning this test can be used) for the duration of the COVID-19 declaration under Section 564(b)(1) of the Act, 21 U.S.C. section 360bbb-3(b)(1), unless the authorization is terminated or revoked.  Performed at [facility], [address] [place], [place] [address]   Blood Culture (routine x 2)     Status: None (Preliminary result)   Collection Time: 05/30/21  9:27 AM   Specimen: BLOOD  Result Value Ref Range Status   Specimen Description BLOOD LEFT ANTECUBITAL  Final   Special Requests   Final    BOTTLES DRAWN AEROBIC AND ANAEROBIC Blood Culture adequate volume   Culture   Final    NO GROWTH < 24 HOURS Performed at [facility], [address] [place], [place] [address]    Report Status PENDING  Incomplete  Blood Culture (routine x 2)     Status: None (Preliminary result)   Collection Time: 05/30/21  9:27 AM   Specimen: BLOOD  Result Value Ref Range Status   Specimen Description BLOOD RIGHT ANTECUBITAL   Final   Special Requests   Final    BOTTLES DRAWN AEROBIC AND ANAEROBIC Blood Culture adequate volume   Culture   Final    NO GROWTH < 24 HOURS Performed at [facility], [address], [place],  [address]    Report Status PENDING  Incomplete         Radiology Studies: CT HEAD WO CONTRAST (5MM)  Result Date: 05/30/2021 CLINICAL DATA:  Evaluate for subarachnoid hemorrhage. Complains of weakness. EXAM: CT HEAD WITHOUT CONTRAST TECHNIQUE: Contiguous axial images were obtained from the base of the skull through the vertex without intravenous contrast. RADIATION DOSE REDUCTION: This exam was performed according to the departmental dose-optimization program which includes automated exposure control, adjustment of the mA and/or kV according to patient size and/or use of iterative reconstruction technique. COMPARISON:  04/17/2021 FINDINGS: Brain: No  evidence of acute infarction, hemorrhage, hydrocephalus, extra-axial collection or mass lesion/mass effect. Again noted are chronic left basal ganglia lacunar infarcts, image 16/2. There is mild diffuse low-attenuation within the subcortical and periventricular white matter compatible with chronic microvascular disease. Prominence of sulci and ventricles compatible with brain atrophy. Vascular: No hyperdense vessel or unexpected calcification. Skull: Postoperative change from right frontoparietal craniotomy. No fracture or focal lesion identified. Sinuses/Orbits: Unchanged chronic opacification of the right maxillary sinus. No acute findings. Other: None IMPRESSION: 1. No acute intracranial abnormalities. 2. Chronic small vessel ischemic disease and brain atrophy. 3. Chronic left basal ganglia lacunar infarcts. Electronically Signed   By: Kerby Moors M.D.   On: 05/30/2021 09:48   CT CHEST WO CONTRAST  Result Date: 05/30/2021 CLINICAL DATA:  Shortness of breath.  Pneumonia. EXAM: CT CHEST WITHOUT CONTRAST TECHNIQUE: Multidetector CT  imaging of the chest was performed following the standard protocol without IV contrast. RADIATION DOSE REDUCTION: This exam was performed according to the departmental dose-optimization program which includes automated exposure control, adjustment of the mA and/or kV according to patient size and/or use of iterative reconstruction technique. COMPARISON:  None. FINDINGS: Cardiovascular: The heart size is enlarged. No substantial pericardial effusion. Moderate atherosclerotic calcification is noted in the wall of the thoracic aorta. Coronary artery calcification is evident. Mediastinum/Nodes: Mild mediastinal lymphadenopathy evident. 1.3 cm short axis precarinal node identified on image 60/2. 1.2 cm short axis subcarinal node identified on 74/2 No evidence for gross hilar lymphadenopathy although assessment is limited by the lack of intravenous contrast on the current study. The esophagus has normal imaging features. 1.6 cm right thyroid nodule evident. Lungs/Pleura: Interlobular septal thickening with patchy ground-glass airspace disease noted right upper lobe. Consolidative airspace disease with some volume loss noted in the lower lobes bilaterally, right greater than left. Moderate right with tiny left pleural effusions. Upper Abdomen: Unremarkable. Musculoskeletal: No worrisome lytic or sclerotic osseous abnormality. Old posterior right rib fractures evident. IMPRESSION: 1. Moderate right with tiny left pleural effusions. 2. Patchy ground-glass opacity in the right upper lobe is associated with bibasilar collapse/consolidation, right greater than left. Imaging features suggest multifocal infection. Follow-up recommended to ensure resolution. 3. Mild mediastinal lymphadenopathy, likely reactive. This could also be reassessed at the time of follow-up. 4. 1.6 cm right thyroid nodule. This has been evaluated on previous imaging. (ref: J Am Coll Radiol. 2015 Feb;12(2): 143-50). 5. Aortic Atherosclerosis (ICD10-I70.0).  Electronically Signed   By: Misty Stanley M.D.   On: 05/30/2021 15:01   DG Chest Port 1 View  Result Date: 05/31/2021 CLINICAL DATA:  88 year old female with hypoxia. EXAM: PORTABLE CHEST 1 VIEW COMPARISON:  Chest CT and portable radiographs yesterday and earlier. FINDINGS: Portable AP upright view at 0512 hours. Combined veiling and confluent opacity throughout the right lung compatible with multilobar infection and pleural effusion demonstrated by CT yesterday. Lower lung volumes and right lung base ventilation has worsened. Contralateral left lower lobe atelectasis versus consolidation. Stable cardiomegaly and mediastinal contours. No pneumothorax. Stable visualized osseous structures. IMPRESSION: Lower lung volumes with worsening right lung ventilation from the CT yesterday most suggestive of multilobar right lung infection with pleural effusion. Ongoing left lung base atelectasis versus infection. Electronically Signed   By: Genevie Ann M.D.   On: 05/31/2021 05:32   DG Chest Port 1 View  Result Date: 05/30/2021 CLINICAL DATA:  Questionable sepsis EXAM: PORTABLE CHEST 1 VIEW COMPARISON:  Chest x-ray 04/16/2021 FINDINGS: Heart is enlarged. Mediastinum appears stable. Calcified plaques in the aortic arch. Extensive  increased ground-glass opacities in the right lung with possible small right pleural effusion. Left lung appears grossly clear. No pneumothorax identified. IMPRESSION: Extensive opacities throughout the right lung which are new since previous study and concerning for pneumonia. Likely small right pleural effusion. Electronically Signed   By: Ofilia Neas M.D.   On: 05/30/2021 09:33        Scheduled Meds:  apixaban  2.5 mg Oral BID   atorvastatin  40 mg Oral Daily   calcium carbonate  1,250 mg Oral BID WC   furosemide  20 mg Intravenous BID   insulin aspart  0-9 Units Subcutaneous Q4H   ipratropium-albuterol  3 mL Nebulization BID   levothyroxine  88 mcg Oral QAC breakfast   [START  ON 06/01/2021] lisinopril  2.5 mg Oral Daily   mouth rinse  15 mL Mouth Rinse BID   melatonin  5 mg Oral QHS   mirtazapine  15 mg Oral QHS   multivitamin-lutein  1 capsule Oral QPM   sodium chloride flush  3 mL Intravenous Q12H   Continuous Infusions:  sodium chloride     azithromycin Stopped (05/30/21 1608)   cefTRIAXone (ROCEPHIN)  IV Stopped (05/30/21 2343)     LOS: 1 day   Time spent= 35 mins    Eliese Kerwood Arsenio Loader, MD [facility]  If 7PM-7AM, please contact night-coverage  05/31/2021, 11:58 AM

## 2021-05-31 NOTE — Assessment & Plan Note (Addendum)
Sliding scale and Accu-Cheks A1c 7.18 Apr 2021

## 2021-05-31 NOTE — Assessment & Plan Note (Addendum)
Multifocal pneumonia seen on the CT.  Currently on empiric IV Rocephin and azithromycin.  Bronchodilators.  Procal 0.23

## 2021-05-31 NOTE — Procedures (Signed)
PROCEDURE SUMMARY:  Successful US guided diagnostic and therapeutic right thoracentesis. Yielded 650 cc of clear, amber fluid. Pt tolerated procedure well. No immediate complications.  Specimen was sent for labs. CXR ordered.  EBL < 1 mL  Tyson Alias, AGNP 05/31/2021 2:04 PM

## 2021-05-31 NOTE — Assessment & Plan Note (Signed)
Synthroid 

## 2021-05-31 NOTE — Consult Note (Signed)
PULMONOLOGY         Date: 05/31/2021,   MRN# [id number] [name] [birth year]     AdmissionWeight: 57.8 kg                 CurrentWeight: 57.8 kg   Referring physician: Dr Reesa Chew   CHIEF COMPLAINT:   Acute hypoxemic respiratory failure   HISTORY OF PRESENT ILLNESS   88 yo F with Hx of CHF DM HTN AF on anticoagulation chronically.  Patient is unwell and sleeping during my evaluation but daughter is at bedside shares that patient is DNR and DNI but without dementia.    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   I reviewed Chest imaging independently and with family at bedside.  We looked at images together with findings of right upper lobe infiltrate, right moderate pleural effusion and diffuse bronchitic changes.   She is receiving rocephin and zithromax.    PAST MEDICAL HISTORY   Past Medical History:  Diagnosis Date   A-fib [facility])    CHF (congestive heart failure) ([place])    Diabetes mellitus without complication ([place])    Hypertension      SURGICAL HISTORY   Past Surgical History:  Procedure Laterality Date   AMPUTATION Right 10/10/2019   Procedure: AMPUTATION ABOVE KNEE;  Surgeon: Katha Cabal, MD;  Location: [facility] ORS;  Service: Vascular;  Laterality: Right;   CHOLECYSTECTOMY     LOWER EXTREMITY ANGIOGRAPHY Right 08/12/2017   Procedure: LOWER EXTREMITY ANGIOGRAPHY;  Surgeon: Katha Cabal, MD;  Location: [place] [facility];  Service: Cardiovascular;  Laterality: Right;   LOWER EXTREMITY ANGIOGRAPHY Right 06/13/2018   Procedure: LOWER EXTREMITY ANGIOGRAPHY;  Surgeon: Katha Cabal, MD;  Location: [place] [facility];  Service: Cardiovascular;  Laterality: Right;   LOWER EXTREMITY ANGIOGRAPHY Right 03/01/2019   Procedure: LOWER EXTREMITY ANGIOGRAPHY;  Surgeon: Katha Cabal, MD;  Location: [place] [facility];  Service: Cardiovascular;  Laterality: Right;    LOWER EXTREMITY ANGIOGRAPHY Right 07/31/2019   Procedure: LOWER EXTREMITY ANGIOGRAPHY;  Surgeon: Katha Cabal, MD;  Location: [place] [facility];  Service: Cardiovascular;  Laterality: Right;   LOWER EXTREMITY ANGIOGRAPHY Right 10/04/2019   Procedure: Lower Extremity Angiography;  Surgeon: Katha Cabal, MD;  Location: [place] [facility];  Service: Cardiovascular;  Laterality: Right;     FAMILY HISTORY   Family History  Problem Relation Age of Onset   Cancer Brother      SOCIAL HISTORY   Social History   Tobacco Use   Smoking status: Never   Smokeless tobacco: Never  Vaping Use   Vaping Use: Never used  Substance Use Topics   Alcohol use: No   Drug use: No     MEDICATIONS    Home Medication:    Current Medication:  Current Facility-Administered Medications:    0.9 %  sodium chloride infusion, 250 mL, Intravenous, PRN, Agbata, Tochukwu, MD   acetaminophen (TYLENOL) tablet 325-650 mg, 325-650 mg, Oral, Q6H PRN, Agbata, Tochukwu, MD   amLODipine (NORVASC) tablet 10 mg, 10 mg, Oral, Daily, Agbata, Tochukwu, MD   apixaban (ELIQUIS) tablet 2.5 mg, 2.5 mg, Oral, BID, Agbata, Tochukwu, MD, 2.5 mg at 05/30/21 2253   atorvastatin (LIPITOR) tablet 40 mg, 40 mg, Oral, Daily, Agbata, Tochukwu, MD   azithromycin (ZITHROMAX) 500 mg in sodium chloride 0.9 % 250 mL IVPB, 500 mg, Intravenous, Q24H, Agbata, Tochukwu, MD,  Stopped at 05/30/21 1608   calcium carbonate (OS-CAL - dosed in mg of elemental calcium) tablet 1,250 mg, 1,250 mg, Oral, BID WC, Agbata, Tochukwu, MD   cefTRIAXone (ROCEPHIN) 2 g in sodium chloride 0.9 % 100 mL IVPB, 2 g, Intravenous, Q24H, Agbata, Tochukwu, MD, Stopped at 05/30/21 2343   furosemide (LASIX) injection 20 mg, 20 mg, Intravenous, BID, Amin, Ankit Chirag, MD, 20 mg at 05/31/21 0940   guaiFENesin-dextromethorphan (ROBITUSSIN DM) 100-10 MG/5ML syrup 10 mL, 10 mL, Oral, Q4H PRN, Agbata, Tochukwu, MD, 10 mL at 05/31/21 0041   hydrALAZINE  (APRESOLINE) injection 10 mg, 10 mg, Intravenous, Q4H PRN, Amin, Ankit Chirag, MD   insulin aspart (novoLOG) injection 0-9 Units, 0-9 Units, Subcutaneous, Q4H, Agbata, Tochukwu, MD, 1 Units at 05/31/21 0432   ipratropium-albuterol (DUONEB) 0.5-2.5 (3) MG/3ML nebulizer solution 3 mL, 3 mL, Nebulization, Q4H PRN, Amin, Ankit Chirag, MD   ipratropium-albuterol (DUONEB) 0.5-2.5 (3) MG/3ML nebulizer solution 3 mL, 3 mL, Nebulization, BID, Amin, Ankit Chirag, MD, 3 mL at 05/31/21 1157   levothyroxine (SYNTHROID) tablet 88 mcg, 88 mcg, Oral, QAC breakfast, Agbata, Tochukwu, MD, 88 mcg at 05/31/21 0557   lisinopril (ZESTRIL) tablet 5 mg, 5 mg, Oral, Daily, Agbata, Tochukwu, MD   MEDLINE mouth rinse, 15 mL, Mouth Rinse, BID, Agbata, Tochukwu, MD, 15 mL at 05/30/21 2019   melatonin tablet 5 mg, 5 mg, Oral, QHS, Sharion Settler, NP, 5 mg at 05/30/21 2331   metoprolol tartrate (LOPRESSOR) injection 5 mg, 5 mg, Intravenous, Q4H PRN, Amin, Ankit Chirag, MD   mirtazapine (REMERON) tablet 15 mg, 15 mg, Oral, QHS, Agbata, Tochukwu, MD, 15 mg at 05/30/21 2253   multivitamin-lutein (OCUVITE-LUTEIN) capsule 1 capsule, 1 capsule, Oral, QPM, Agbata, Tochukwu, MD   ondansetron (ZOFRAN) tablet 4 mg, 4 mg, Oral, Q6H PRN **OR** ondansetron (ZOFRAN) injection 4 mg, 4 mg, Intravenous, Q6H PRN, Agbata, Tochukwu, MD   senna-docusate (Senokot-S) tablet 1 tablet, 1 tablet, Oral, QHS PRN, Amin, Ankit Chirag, MD   sodium chloride flush (NS) 0.9 % injection 3 mL, 3 mL, Intravenous, Q12H, Agbata, Tochukwu, MD, 3 mL at 05/30/21 2018   sodium chloride flush (NS) 0.9 % injection 3 mL, 3 mL, Intravenous, PRN, Agbata, Tochukwu, MD    ALLERGIES   Sulfa antibiotics     REVIEW OF SYSTEMS    Review of Systems:  Gen:  Denies  fever, sweats, chills weigh loss  HEENT: Denies blurred vision, double vision, ear pain, eye pain, hearing loss, nose bleeds, sore throat Cardiac:  No dizziness, chest pain or heaviness, chest  tightness,edema Resp:   Denies cough or sputum porduction, shortness of breath,wheezing, hemoptysis,  Gi: Denies swallowing difficulty, stomach pain, nausea or vomiting, diarrhea, constipation, bowel incontinence Gu:  Denies bladder incontinence, burning urine Ext:   Denies Joint pain, stiffness or swelling Skin: Denies  skin rash, easy bruising or bleeding or hives Endoc:  Denies polyuria, polydipsia , polyphagia or weight change Psych:   Denies depression, insomnia or hallucinations   Other:  All other systems negative   VS: BP (!) 112/58 (BP Location: Left Arm)    Pulse 64    Temp (!) 97.5 F (36.4 C) (Oral)    Resp (!) 22    Ht 5\' 2"  (1.575 m)    Wt 57.8 kg    SpO2 92%    BMI 23.32 kg/m      PHYSICAL EXAM    GENERAL:NAD, no fevers, chills, no weakness no fatigue HEAD: Normocephalic, atraumatic.  EYES: Pupils equal, round, reactive  to light. Extraocular muscles intact. No scleral icterus.  MOUTH: Moist mucosal membrane. Dentition intact. No abscess noted.  EAR, NOSE, THROAT: Clear without exudates. No external lesions.  NECK: Supple. No thyromegaly. No nodules. No JVD.  PULMONARY: decreased breath sounds on right CARDIOVASCULAR: S1 and S2. Regular rate and rhythm. No murmurs, rubs, or gallops. No edema. Pedal pulses 2+ bilaterally.  GASTROINTESTINAL: Soft, nontender, nondistended. No masses. Positive bowel sounds. No hepatosplenomegaly.  MUSCULOSKELETAL: No swelling, clubbing, or edema. Range of motion full in all extremities.  NEUROLOGIC: Cranial nerves II through XII are intact. No gross focal neurological deficits. Sensation intact. Reflexes intact.  SKIN: No ulceration, lesions, rashes, or cyanosis. Skin warm and dry. Turgor intact.  PSYCHIATRIC: Mood, affect within normal limits. The patient is awake, alert and oriented x 3. Insight, judgment intact.       IMAGING    CT HEAD WO CONTRAST (5MM)  Result Date: 05/30/2021 CLINICAL DATA:  Evaluate for subarachnoid  hemorrhage. Complains of weakness. EXAM: CT HEAD WITHOUT CONTRAST TECHNIQUE: Contiguous axial images were obtained from the base of the skull through the vertex without intravenous contrast. RADIATION DOSE REDUCTION: This exam was performed according to the departmental dose-optimization program which includes automated exposure control, adjustment of the mA and/or kV according to patient size and/or use of iterative reconstruction technique. COMPARISON:  04/17/2021 FINDINGS: Brain: No evidence of acute infarction, hemorrhage, hydrocephalus, extra-axial collection or mass lesion/mass effect. Again noted are chronic left basal ganglia lacunar infarcts, image 16/2. There is mild diffuse low-attenuation within the subcortical and periventricular white matter compatible with chronic microvascular disease. Prominence of sulci and ventricles compatible with brain atrophy. Vascular: No hyperdense vessel or unexpected calcification. Skull: Postoperative change from right frontoparietal craniotomy. No fracture or focal lesion identified. Sinuses/Orbits: Unchanged chronic opacification of the right maxillary sinus. No acute findings. Other: None IMPRESSION: 1. No acute intracranial abnormalities. 2. Chronic small vessel ischemic disease and brain atrophy. 3. Chronic left basal ganglia lacunar infarcts. Electronically Signed   By: Kerby Moors M.D.   On: 05/30/2021 09:48   CT CHEST WO CONTRAST  Result Date: 05/30/2021 CLINICAL DATA:  Shortness of breath.  Pneumonia. EXAM: CT CHEST WITHOUT CONTRAST TECHNIQUE: Multidetector CT imaging of the chest was performed following the standard protocol without IV contrast. RADIATION DOSE REDUCTION: This exam was performed according to the departmental dose-optimization program which includes automated exposure control, adjustment of the mA and/or kV according to patient size and/or use of iterative reconstruction technique. COMPARISON:  None. FINDINGS: Cardiovascular: The heart size  is enlarged. No substantial pericardial effusion. Moderate atherosclerotic calcification is noted in the wall of the thoracic aorta. Coronary artery calcification is evident. Mediastinum/Nodes: Mild mediastinal lymphadenopathy evident. 1.3 cm short axis precarinal node identified on image 60/2. 1.2 cm short axis subcarinal node identified on 74/2 No evidence for gross hilar lymphadenopathy although assessment is limited by the lack of intravenous contrast on the current study. The esophagus has normal imaging features. 1.6 cm right thyroid nodule evident. Lungs/Pleura: Interlobular septal thickening with patchy ground-glass airspace disease noted right upper lobe. Consolidative airspace disease with some volume loss noted in the lower lobes bilaterally, right greater than left. Moderate right with tiny left pleural effusions. Upper Abdomen: Unremarkable. Musculoskeletal: No worrisome lytic or sclerotic osseous abnormality. Old posterior right rib fractures evident. IMPRESSION: 1. Moderate right with tiny left pleural effusions. 2. Patchy ground-glass opacity in the right upper lobe is associated with bibasilar collapse/consolidation, right greater than left. Imaging features suggest multifocal infection. Follow-up  recommended to ensure resolution. 3. Mild mediastinal lymphadenopathy, likely reactive. This could also be reassessed at the time of follow-up. 4. 1.6 cm right thyroid nodule. This has been evaluated on previous imaging. (ref: J Am Coll Radiol. 2015 Feb;12(2): 143-50). 5. Aortic Atherosclerosis (ICD10-I70.0). Electronically Signed   By: Misty Stanley M.D.   On: 05/30/2021 15:01   DG Chest Port 1 View  Result Date: 05/31/2021 CLINICAL DATA:  88 year old female with hypoxia. EXAM: PORTABLE CHEST 1 VIEW COMPARISON:  Chest CT and portable radiographs yesterday and earlier. FINDINGS: Portable AP upright view at 0512 hours. Combined veiling and confluent opacity throughout the right lung compatible with  multilobar infection and pleural effusion demonstrated by CT yesterday. Lower lung volumes and right lung base ventilation has worsened. Contralateral left lower lobe atelectasis versus consolidation. Stable cardiomegaly and mediastinal contours. No pneumothorax. Stable visualized osseous structures. IMPRESSION: Lower lung volumes with worsening right lung ventilation from the CT yesterday most suggestive of multilobar right lung infection with pleural effusion. Ongoing left lung base atelectasis versus infection. Electronically Signed   By: Genevie Ann M.D.   On: 05/31/2021 05:32   DG Chest Port 1 View  Result Date: 05/30/2021 CLINICAL DATA:  Questionable sepsis EXAM: PORTABLE CHEST 1 VIEW COMPARISON:  Chest x-ray 04/16/2021 FINDINGS: Heart is enlarged. Mediastinum appears stable. Calcified plaques in the aortic arch. Extensive increased ground-glass opacities in the right lung with possible small right pleural effusion. Left lung appears grossly clear. No pneumothorax identified. IMPRESSION: Extensive opacities throughout the right lung which are new since previous study and concerning for pneumonia. Likely small right pleural effusion. Electronically Signed   By: Ofilia Neas M.D.   On: 05/30/2021 09:33      ASSESSMENT/PLAN   Acute hypoxemic respiratory failure    Patient with compressive atelectasis due to moderate pleural effusion with possible CAP vs pulmonary edema.  - she is on diuretics and antibiotics -patient is on eliquis and we are holding this for now due to ordered thoracentesis   Moderate right pleural effusion    -hold eliquis for now   -thoracentesis ordered - patient is agreeable to procedure.    Chronic renal failure   - due to hypotension and ongoing diuresis , will hold amlodipine for now and reduce lisinopril to 2.5 mg po daily    Chronic systolic CHF  TTE with low normal systolic function - ongoing diuresis   Patient with multiple severe active comorbid  conditions including s/p CVA , amputation status, CHF, hypoxemic failure and is a nanogenerian     Thank you for allowing me to participate in the care of this patient.   Patient/Family are satisfied with care plan and all questions have been answered.  This document was prepared using Dragon voice recognition software and may include unintentional dictation errors.     Ottie Glazier, M.D.  Division of [facility]

## 2021-06-01 LAB — BASIC METABOLIC PANEL
Anion gap: 7 (ref 5–15)
BUN: 36 mg/dL — ABNORMAL HIGH (ref 8–23)
CO2: 26 mmol/L (ref 22–32)
Calcium: 8.7 mg/dL — ABNORMAL LOW (ref 8.9–10.3)
Chloride: 110 mmol/L (ref 98–111)
Creatinine, Ser: 1.68 mg/dL — ABNORMAL HIGH (ref 0.44–1.00)
GFR, Estimated: 28 mL/min — ABNORMAL LOW (ref >60)
Glucose, Bld: 134 mg/dL — ABNORMAL HIGH (ref 70–99)
Potassium: 3.6 mmol/L (ref 3.5–5.1)
Sodium: 143 mmol/L (ref 135–145)

## 2021-06-01 LAB — CBC
HCT: 35.6 % — ABNORMAL LOW (ref 36.0–46.0)
Hemoglobin: 11.8 g/dL — ABNORMAL LOW (ref 12.0–15.0)
MCH: 30.9 pg (ref 26.0–34.0)
MCHC: 33.1 g/dL (ref 30.0–36.0)
MCV: 93.2 fL (ref 80.0–100.0)
Platelets: 235 10*3/uL (ref 150–400)
RBC: 3.82 MIL/uL — ABNORMAL LOW (ref 3.87–5.11)
RDW: 12.4 % (ref 11.5–15.5)
WBC: 10.1 10*3/uL (ref 4.0–10.5)
nRBC: 0 % (ref 0.0–0.2)

## 2021-06-01 LAB — GLUCOSE, CAPILLARY
Glucose-Capillary: 118 mg/dL — ABNORMAL HIGH (ref 70–99)
Glucose-Capillary: 119 mg/dL — ABNORMAL HIGH (ref 70–99)
Glucose-Capillary: 119 mg/dL — ABNORMAL HIGH (ref 70–99)
Glucose-Capillary: 124 mg/dL — ABNORMAL HIGH (ref 70–99)
Glucose-Capillary: 149 mg/dL — ABNORMAL HIGH (ref 70–99)
Glucose-Capillary: 216 mg/dL — ABNORMAL HIGH (ref 70–99)
Glucose-Capillary: 221 mg/dL — ABNORMAL HIGH (ref 70–99)

## 2021-06-01 LAB — MAGNESIUM: Magnesium: 1.9 mg/dL (ref 1.7–2.4)

## 2021-06-01 MED ORDER — IPRATROPIUM-ALBUTEROL 0.5-2.5 (3) MG/3ML IN SOLN: 3.0000 mL | Freq: Four times a day (QID) | RESPIRATORY_TRACT | Status: DC | PRN

## 2021-06-01 MED ORDER — POTASSIUM CHLORIDE 20 MEQ PO PACK
40.0000 meq | PACK | Freq: Once | ORAL | Status: AC
  Administered 2021-06-01: 40 meq via ORAL
  Filled 2021-06-01: qty 2

## 2021-06-01 NOTE — Progress Notes (Signed)
PROGRESS NOTE    [name]  GXQ:[id number] DOB: Apr 28, 1925 DOA: 05/30/2021 PCP: Sofie Hartigan, MD   Brief Narrative:  88 year old with history of recent CVA status post tPA, PAD status post right-sided AKA, DM 2, CKD stage IIIa, HTN, HLD admitted to the hospital for shortness of breath.  She was found to have acute respiratory distress likely from fluid overload, right-sided pleural effusion with possibly TOF multifocal pneumonia.  She was started on IV diuretics, pulmonary was consulted for thoracentesis.  She was empirically started on IV Rocephin and azithromycin as well.  Pulmonary was consulted and she underwent thoracentesis on 2/19.  650 cc was removed.  Study showed transudative fluid, no organisms were seen on micro.     Assessment & Plan:  Principal Problem:   Acute respiratory failure with hypoxia (HCC) Active Problems:   Pneumonia   Acute on chronic diastolic CHF (congestive heart failure) (HCC)   Stage 3a chronic kidney disease (CKD) (HCC)   PAD (peripheral artery disease) (HCC)   Type 2 diabetes mellitus with stage 4 chronic kidney disease, without long-term current use of insulin (HCC)   Hyperlipidemia   Essential hypertension   Hypothyroidism   Mild aortic valve stenosis   DNR (do not resuscitate)   Phantom pain after amputation of lower extremity ([place])   Paroxysmal atrial fibrillation ([place])     Assessment and Plan: * Acute respiratory failure with hypoxia ([place])- (present on admission) CT of the chest shows multifocal infection, moderate right-sided pleural effusion, mediastinal lymphadenopathy and previously known 1.6 cm right-sided thyroid nodule Currently on 5L [place] ProCal 0.23; BNP 600 On empiric IV Rocephin and azithromycin Bronchodilators Echocardiogram January 2022 showed EF 55% Seen by pulmonary.  Status post thoracentesis, 650 cc removed.  Transudative fluid.  No organisms seen on blood cultures.  Stage 3a chronic kidney disease (CKD)  (HCC) Creatinine is around baseline of 1.6.  Continue to closely monitor this.  Acute on chronic diastolic CHF (congestive heart failure) (HCC) Echocardiogram in January 2020 showed EF of 55%.  Elevated BNP, status post thoracentesis 2/19, 650 cc removed. Increase Lasix to 40 mg IV twice daily.  Monitor blood pressure.  Pneumonia- (present on admission) Multifocal pneumonia seen on the CT.  Currently on empiric IV Rocephin and Zosyn.  Bronchodilators.  Procal 0.23  Paroxysmal atrial fibrillation ([place])- (present on admission) Chronic.  Rate controlled Continue Eliquis  Phantom pain after amputation of lower extremity ([place])- (present on admission) Secondary to history of AKA  DNR (do not resuscitate)- (present on admission) CODE STATUS confirmed-DNR  Mild aortic valve stenosis Seen on previous echocardiogram.  Currently chest pain-free.  Not symptomatic  Hypothyroidism- (present on admission) Synthroid  Essential hypertension Cont IV lasix. Norvasc held Lisinopril reduced 2.5mg  po daily  Hyperlipidemia- (present on admission) Daily Lipitor  Type 2 diabetes mellitus with stage 4 chronic kidney disease, without long-term current use of insulin ([place])- (present on admission) Sliding scale and Accu-Cheks A1c 7.18 Apr 2921  PAD (peripheral artery disease) ([place])- (present on admission) On Eliquis and statin.  Has history of AKA          DVT prophylaxis: apixaban (ELIQUIS) tablet 2.5 mg Start: 05/30/21 2200 apixaban (ELIQUIS) tablet 2.5 mg  Code Status: DNR Family Communication: Son at bedside  Status is: Inpatient Remains inpatient appropriate because: Patient still remains on 5 L of nasal cannula.  She is not on any home oxygen.  Getting aggressive treatment with IV Lasix.  Subjective: Seen and examined at bedside, currently on 5 L nasal cannula.  Overall appears weak.  Reports her shortness of breath is improved  Review of Systems Otherwise  negative except as per HPI, including: General: Denies fever, chills, night sweats or unintended weight loss. Resp: Denies cough, wheezing, shortness of breath. Cardiac: Denies chest pain, palpitations, orthopnea, paroxysmal nocturnal dyspnea. GI: Denies abdominal pain, nausea, vomiting, diarrhea or constipation GU: Denies dysuria, frequency, hesitancy or incontinence MS: Denies muscle aches, joint pain or swelling Neuro: Denies headache, neurologic deficits (focal weakness, numbness, tingling), abnormal gait Psych: Denies anxiety, depression, SI/HI/AVH Skin: Denies new rashes or lesions ID: Denies sick contacts, exotic exposures, travel  Examination:  General exam: Appears calm and comfortable, 5 L nasal cannula.  Elderly frail. Respiratory system: Bilateral diminished breath sounds at the bases Cardiovascular system: S1 & S2 heard, RRR. No JVD, systolic murmurs heard, 2+  lower extremity pitting edema Gastrointestinal system: Abdomen is nondistended, soft and nontender. No organomegaly or masses felt. Normal bowel sounds heard. Central nervous system: Alert and oriented. No focal neurological deficits. Extremities: Symmetric 5 x 5 power. Skin: No rashes, lesions or ulcers Psychiatry: Judgement and insight appear normal. Mood & affect appropriate.     Objective: Vitals:   05/31/21 2113 06/01/21 0129 06/01/21 0353 06/01/21 0740  BP:   (!) 145/57   Pulse:   70   Resp:   18   Temp:   97.7 F (36.5 C)   TempSrc:      SpO2: 98% 98% 97% 93%  Weight:      Height:        Intake/Output Summary (Last 24 hours) at 06/01/2021 1126 Last data filed at 06/01/2021 1026 Gross per 24 hour  Intake 473.82 ml  Output 1300 ml  Net -826.18 ml   Filed Weights   05/30/21 0852  Weight: 57.8 kg     Data Reviewed:   CBC: Recent Labs  Lab 05/30/21 0904 05/31/21 0534 06/01/21 0530  WBC 12.7* 10.6* 10.1  NEUTROABS 9.8*  --   --   HGB 13.3 11.9* 11.8*  HCT 41.7 37.8 35.6*  MCV 95.4  96.9 93.2  PLT 251 225 409   Basic Metabolic Panel: Recent Labs  Lab 05/30/21 0904 05/31/21 0534 06/01/21 0530  NA 142 146* 143  K 4.3 4.4 3.6  CL 109 112* 110  CO2 24 24 26   GLUCOSE 230* 143* 134*  BUN 27* 31* 36*  CREATININE 1.78* 1.69* 1.68*  CALCIUM 9.1 8.9 8.7*  MG  --   --  1.9   GFR: Estimated Creatinine Clearance: 16.5 mL/min (A) (by C-G formula based on SCr of 1.68 mg/dL (H)). Liver Function Tests: Recent Labs  Lab 05/30/21 0904  AST 45*  ALT 36  ALKPHOS 105  BILITOT 1.2  PROT 8.1  ALBUMIN 3.4*   No results for input(s): LIPASE, AMYLASE in the last 168 hours. No results for input(s): AMMONIA in the last 168 hours. Coagulation Profile: Recent Labs  Lab 05/30/21 0904  INR 1.3*   Cardiac Enzymes: No results for input(s): CKTOTAL, CKMB, CKMBINDEX, TROPONINI in the last 168 hours. BNP (last 3 results) No results for input(s): PROBNP in the last 8760 hours. HbA1C: No results for input(s): HGBA1C in the last 72 hours. CBG: Recent Labs  Lab 05/31/21 2026 05/31/21 2301 06/01/21 0322 06/01/21 0419 06/01/21 0808  GLUCAP 165* 224* 119* 119* 124*   Lipid Profile: No results for input(s): CHOL, HDL, LDLCALC, TRIG, CHOLHDL, LDLDIRECT in the last 72  hours. Thyroid Function Tests: Recent Labs    05/31/21 0534  TSH 0.967   Anemia Panel: No results for input(s): VITAMINB12, FOLATE, FERRITIN, TIBC, IRON, RETICCTPCT in the last 72 hours. Sepsis Labs: Recent Labs  Lab 05/30/21 0904 05/30/21 0927 05/30/21 1106 05/31/21 0534 05/31/21 1125  PROCALCITON 0.11  --   --  0.23 <0.10  LATICACIDVEN  --  1.1 0.9  --   --     Recent Results (from the past 240 hour(s))  Resp Panel by RT-PCR (Flu A&B, Covid) Nasopharyngeal Swab     Status: None   Collection Time: 05/30/21  9:27 AM   Specimen: Nasopharyngeal Swab; Nasopharyngeal(NP) swabs in vial transport medium  Result Value Ref Range Status   SARS Coronavirus 2 by RT PCR NEGATIVE NEGATIVE Final    Comment:  (NOTE) SARS-CoV-2 target nucleic acids are NOT DETECTED.  The SARS-CoV-2 RNA is generally detectable in upper respiratory specimens during the acute phase of infection. The lowest concentration of SARS-CoV-2 viral copies this assay can detect is 138 copies/mL. A negative result does not preclude SARS-Cov-2 infection and should not be used as the sole basis for treatment or other patient management decisions. A negative result may occur with  improper specimen collection/handling, submission of specimen other than nasopharyngeal swab, presence of viral mutation(s) within the areas targeted by this assay, and inadequate number of viral copies(<138 copies/mL). A negative result must be combined with clinical observations, patient history, and epidemiological information. The expected result is Negative.  Fact Sheet for Patients:  EntrepreneurPulse.com.au  Fact Sheet for Healthcare Providers:  IncredibleEmployment.be  This test is no t yet approved or cleared by the [place] FDA and  has been authorized for detection and/or diagnosis of SARS-CoV-2 by FDA under an Emergency Use Authorization (EUA). This EUA will remain  in effect (meaning this test can be used) for the duration of the COVID-19 declaration under Section 564(b)(1) of the Act, 21 U.S.C.section 360bbb-3(b)(1), unless the authorization is terminated  or revoked sooner.       Influenza A by PCR NEGATIVE NEGATIVE Final   Influenza B by PCR NEGATIVE NEGATIVE Final    Comment: (NOTE) The Xpert Xpress SARS-CoV-2/FLU/RSV plus assay is intended as an aid in the diagnosis of influenza from Nasopharyngeal swab specimens and should not be used as a sole basis for treatment. Nasal washings and aspirates are unacceptable for Xpert Xpress SARS-CoV-2/FLU/RSV testing.  Fact Sheet for Patients: EntrepreneurPulse.com.au  Fact Sheet for Healthcare  Providers: IncredibleEmployment.be  This test is not yet approved or cleared by the [place] FDA and has been authorized for detection and/or diagnosis of SARS-CoV-2 by FDA under an Emergency Use Authorization (EUA). This EUA will remain in effect (meaning this test can be used) for the duration of the COVID-19 declaration under Section 564(b)(1) of the Act, 21 U.S.C. section 360bbb-3(b)(1), unless the authorization is terminated or revoked.  Performed at [facility], [address] [place], [place] [address]   Blood Culture (routine x 2)     Status: None (Preliminary result)   Collection Time: 05/30/21  9:27 AM   Specimen: BLOOD  Result Value Ref Range Status   Specimen Description BLOOD LEFT ANTECUBITAL  Final   Special Requests   Final    BOTTLES DRAWN AEROBIC AND ANAEROBIC Blood Culture adequate volume   Culture   Final    NO GROWTH 2 DAYS Performed at [facility], [address], [place], [place] [address]    Report Status PENDING  Incomplete  Blood Culture (routine x 2)     Status: None (Preliminary result)   Collection Time: 05/30/21  9:27 AM   Specimen: BLOOD  Result Value Ref Range Status   Specimen Description BLOOD RIGHT ANTECUBITAL  Final   Special Requests   Final    BOTTLES DRAWN AEROBIC AND ANAEROBIC Blood Culture adequate volume   Culture   Final    NO GROWTH 2 DAYS Performed at [facility], [address] [place], [place] [address]    Report Status PENDING  Incomplete  MRSA Next Gen by PCR, Nasal     Status: None   Collection Time: 05/31/21 11:04 AM   Specimen: Nasal Mucosa; Nasal Swab  Result Value Ref Range Status   MRSA by PCR Next Gen NOT DETECTED NOT DETECTED Final    Comment: (NOTE) The GeneXpert MRSA Assay (FDA approved for NASAL specimens only), is one component of a comprehensive MRSA colonization surveillance program. It is not intended to diagnose MRSA infection nor to guide or monitor  treatment for MRSA infections. Test performance is not FDA approved in patients less than 53 years old. Performed at [facility], [address] [place], [place] [address]   Body fluid culture w Gram Stain     Status: None (Preliminary result)   Collection Time: 05/31/21  2:13 PM   Specimen: PATH Cytology Pleural fluid  Result Value Ref Range Status   Specimen Description   Final    PLEURAL FLUID Performed at [facility], [address], [place], [place] [address]    Special Requests   Final    NONE Performed at [facility], [address] [place], [place] [address]    Gram Stain   Final    WBC PRESENT,BOTH PMN AND MONONUCLEAR NO ORGANISMS SEEN CYTOSPIN SMEAR    Culture   Final    NO GROWTH < 24 HOURS Performed at [facility], [place] [address], [place], [place] [address]    Report Status PENDING  Incomplete         Radiology Studies: DG Chest 1 View  Result Date: 05/31/2021 CLINICAL DATA:  Status post RIGHT thoracentesis. EXAM: CHEST  1 VIEW COMPARISON:  05/31/2021 and prior studies FINDINGS: Cardiomegaly again noted. Bilateral airspace opacities are slightly improved. RIGHT pleural effusion appears somewhat decreased. Bilateral LOWER lung consolidation/atelectasis again noted. There is no evidence of pneumothorax. IMPRESSION: Slightly improved bilateral airspace opacities and likely decreased RIGHT pleural effusion. No pneumothorax. Electronically Signed   By: Margarette Canada M.D.   On: 05/31/2021 14:18   CT CHEST WO CONTRAST  Result Date: 05/30/2021 CLINICAL DATA:  Shortness of breath.  Pneumonia. EXAM: CT CHEST WITHOUT CONTRAST TECHNIQUE: Multidetector CT imaging of the chest was performed following the standard protocol without IV contrast. RADIATION DOSE REDUCTION: This exam was performed according to the departmental dose-optimization program which includes automated exposure control, adjustment of the mA and/or kV according to patient  size and/or use of iterative reconstruction technique. COMPARISON:  None. FINDINGS: Cardiovascular: The heart size is enlarged. No substantial pericardial effusion. Moderate atherosclerotic calcification is noted in the wall of the thoracic aorta. Coronary artery calcification is evident. Mediastinum/Nodes: Mild mediastinal lymphadenopathy evident. 1.3 cm short axis precarinal node identified on image 60/2. 1.2 cm short axis subcarinal node identified on 74/2 No evidence for gross hilar lymphadenopathy although assessment is limited by the lack of intravenous contrast on the current study. The esophagus has normal imaging features. 1.6 cm right thyroid  nodule evident. Lungs/Pleura: Interlobular septal thickening with patchy ground-glass airspace disease noted right upper lobe. Consolidative airspace disease with some volume loss noted in the lower lobes bilaterally, right greater than left. Moderate right with tiny left pleural effusions. Upper Abdomen: Unremarkable. Musculoskeletal: No worrisome lytic or sclerotic osseous abnormality. Old posterior right rib fractures evident. IMPRESSION: 1. Moderate right with tiny left pleural effusions. 2. Patchy ground-glass opacity in the right upper lobe is associated with bibasilar collapse/consolidation, right greater than left. Imaging features suggest multifocal infection. Follow-up recommended to ensure resolution. 3. Mild mediastinal lymphadenopathy, likely reactive. This could also be reassessed at the time of follow-up. 4. 1.6 cm right thyroid nodule. This has been evaluated on previous imaging. (ref: J Am Coll Radiol. 2015 Feb;12(2): 143-50). 5. Aortic Atherosclerosis (ICD10-I70.0). Electronically Signed   By: Misty Stanley M.D.   On: 05/30/2021 15:01   DG Chest Port 1 View  Result Date: 05/31/2021 CLINICAL DATA:  88 year old female with hypoxia. EXAM: PORTABLE CHEST 1 VIEW COMPARISON:  Chest CT and portable radiographs yesterday and earlier. FINDINGS: Portable  AP upright view at 0512 hours. Combined veiling and confluent opacity throughout the right lung compatible with multilobar infection and pleural effusion demonstrated by CT yesterday. Lower lung volumes and right lung base ventilation has worsened. Contralateral left lower lobe atelectasis versus consolidation. Stable cardiomegaly and mediastinal contours. No pneumothorax. Stable visualized osseous structures. IMPRESSION: Lower lung volumes with worsening right lung ventilation from the CT yesterday most suggestive of multilobar right lung infection with pleural effusion. Ongoing left lung base atelectasis versus infection. Electronically Signed   By: Genevie Ann M.D.   On: 05/31/2021 05:32   US THORACENTESIS ASP PLEURAL SPACE W/IMG GUIDE  Result Date: 05/31/2021 INDICATION: History of CHF, DM, HTN and AFib. Patient admitted for shortness of breath, found to right pleural effusion. Request for diagnostic and therapeutic right-sided thoracentesis. EXAM: ULTRASOUND GUIDED DIAGNOSTIC THERAPEUTIC RIGHT THORACENTESIS MEDICATIONS: 10 mL 1 % lidocaine COMPLICATIONS: None immediate. PROCEDURE: An ultrasound guided thoracentesis was thoroughly discussed with the patient and questions answered. The benefits, risks, alternatives and complications were also discussed. The patient understands and wishes to proceed with the procedure. Written consent was obtained. Ultrasound was performed to localize and mark an adequate pocket of fluid in the right chest. The area was then prepped and draped in the normal sterile fashion. 1% Lidocaine was used for local anesthesia. Under ultrasound guidance a 6 Fr Safe-T-Centesis catheter was introduced. Thoracentesis was performed. The catheter was removed and a dressing applied. FINDINGS: A total of approximately 650 cc of clear, amber fluid was removed. Samples were sent to the laboratory as requested by the clinical team. IMPRESSION: Successful ultrasound guided right thoracentesis yielding  650 cc of pleural fluid. Read by: Narda Rutherford, AGNP-BC Electronically Signed   By: Markus Daft M.D.   On: 05/31/2021 15:38        Scheduled Meds:  apixaban  2.5 mg Oral BID   atorvastatin  40 mg Oral Daily   calcium carbonate  1,250 mg Oral BID WC   furosemide  20 mg Intravenous BID   insulin aspart  0-9 Units Subcutaneous Q4H   ipratropium-albuterol  3 mL Nebulization BID   levothyroxine  88 mcg Oral QAC breakfast   lisinopril  2.5 mg Oral Daily   mouth rinse  15 mL Mouth Rinse BID   melatonin  5 mg Oral QHS   mirtazapine  15 mg Oral QHS   multivitamin-lutein  1 capsule Oral QPM   sodium  chloride flush  3 mL Intravenous Q12H   Continuous Infusions:  sodium chloride Stopped (06/01/21 0017)   azithromycin Stopped (05/31/21 1551)   cefTRIAXone (ROCEPHIN)  IV Stopped (05/31/21 2332)     LOS: 2 days   Time spent= 35 mins    Rilee Knoll Arsenio Loader, MD [facility]  If 7PM-7AM, please contact night-coverage  06/01/2021, 11:26 AM

## 2021-06-01 NOTE — Evaluation (Signed)
Occupational Therapy Evaluation Patient Details Name: [name] MRN: [id number] DOB: [birth year] Today's Date: 06/01/2021   History of Present Illness Pt is a 88 y/o F admitted on 05/30/21 after presenting with c/c of SOB. Chest x-ray shows extensive opacities throughout the right lung concerning for pneumonia, small right pleural effusion. Pt is being tx for PNA with ARF. PMH: CVA with R sided hemiparesis, PAD s/p R AKA, DM, stage IV CKD, HTN, dyslipidemia, a-fib, CHF   Clinical Impression   Pt was seen for OT evaluation this date. Prior to hospital admission, t recently returned home from SNF rehab & pt was able to perform bed mobility & bed<>w/c transfers without physical assistance until pt began experiencing RLE pain 2/2 infection, followed by current medical issues that has caused her to be admitted. Currently pt demonstrates impairments in strength, balance, and activity tolerance as described below (See OT problem list) which functionally limit her ability to perform ADL/self-care tasks. Pt currently requires set up/supv for seated grooming tasks, MIN A for bed mobility, and pt declined additional mobility/ADL citing fatigue. Pt would benefit from skilled OT services to address noted impairments and functional limitations (see below for any additional details) in order to maximize safety and independence while minimizing falls risk and caregiver burden. Upon hospital discharge, recommend HHOT to maximize pt safety and return to functional independence during meaningful occupations of daily life.     Recommendations for follow up therapy are one component of a multi-disciplinary discharge planning process, led by the attending physician.  Recommendations may be updated based on patient status, additional functional criteria and insurance authorization.   Follow Up Recommendations  Home health OT    Assistance Recommended at Discharge Frequent or constant Supervision/Assistance  Patient can  return home with the following Two people to help with walking and/or transfers;A lot of help with bathing/dressing/bathroom;Direct supervision/assist for medications management;Direct supervision/assist for financial management;Assistance with cooking/housework;Assist for transportation;Help with stairs or ramp for entrance    Functional Status Assessment  Patient has had a recent decline in their functional status and demonstrates the ability to make significant improvements in function in a reasonable and predictable amount of time.  Equipment Recommendations  BSC/3in1    Recommendations for Other Services       Precautions / Restrictions Precautions Precautions: Fall Precaution Comments: old R AKA Restrictions Weight Bearing Restrictions: No      Mobility Bed Mobility Overal bed mobility: Needs Assistance Bed Mobility: Supine to Sit, Sit to Supine     Supine to sit: Min assist Sit to supine: Min assist   General bed mobility comments: encouragement, VC to complete    Transfers                          Balance Overall balance assessment: Needs assistance Sitting-balance support: Single extremity supported, No upper extremity supported, Feet unsupported Sitting balance-[name] Scale: Fair                                     ADL either performed or assessed with clinical judgement   ADL Overall ADL's : Needs assistance/impaired                                       General ADL Comments: Pt provided with set up for  grooming tasks EOB with supervision. Pt washed her face then declined additional ADL/mobility citing fatigue     Vision         Perception     Praxis      Pertinent Vitals/Pain Pain Assessment Pain Assessment: No/denies pain     Hand Dominance Right   Extremity/Trunk Assessment Upper Extremity Assessment Upper Extremity Assessment: Generalized weakness   Lower Extremity Assessment Lower Extremity  Assessment: Generalized weakness (old R AKA)   Cervical / Trunk Assessment Cervical / Trunk Assessment: Kyphotic (rounded shoulders, forward head)   Communication Communication Communication: No difficulties   Cognition Arousal/Alertness: Awake/alert Behavior During Therapy: WFL for tasks assessed/performed Overall Cognitive Status: No family/caregiver present to determine baseline cognitive functioning                                 General Comments: oriented to self only, follows commands with cues     General Comments       Exercises     Shoulder Instructions      Home Living Family/patient expects to be discharged to:: Private residence Living Arrangements: Children Available Help at Discharge: Family;Available 24 hours/day Type of Home: House Home Access: Ramped entrance     Home Layout: One level               Home Equipment: Wheelchair - manual;BSC/3in1          Prior Functioning/Environment               Mobility Comments: Pt recently returned home from SNF rehab & pt was able to perform bed mobility & bed<>w/c transfers without physical assistance until pt began experiencing RLE pain 2/2 infection, followed by current medical issues that has caused her to be admitted. ADLs Comments: sponge baths, feeds self, family assist with IADL per chart review before SNF        OT Problem List: Decreased strength;Decreased activity tolerance;Impaired balance (sitting and/or standing)      OT Treatment/Interventions: Self-care/ADL training;Therapeutic exercise;Therapeutic activities;Energy conservation;DME and/or AE instruction;Patient/family education;Balance training    OT Goals(Current goals can be found in the care plan section) Acute Rehab OT Goals Patient Stated Goal: pt does not state OT Goal Formulation: With patient Time For Goal Achievement: 06/15/21 Potential to Achieve Goals: Fair ADL Goals Pt Will Perform Grooming:  sitting;with set-up;with supervision Pt Will Perform Lower Body Dressing: sit to/from stand;with mod assist Pt Will Transfer to Toilet: stand pivot transfer;bedside commode;with mod assist;with min assist (LRAD) Pt Will Perform Toileting - Clothing Manipulation and hygiene: sit to/from stand;with min assist  OT Frequency: Min 2X/week    Co-evaluation              AM-PAC OT "6 Clicks" Daily Activity     Outcome Measure Help from another person eating meals?: None Help from another person taking care of personal grooming?: A Little Help from another person toileting, which includes using toliet, bedpan, or urinal?: A Lot Help from another person bathing (including washing, rinsing, drying)?: A Lot Help from another person to put on and taking off regular upper body clothing?: A Little Help from another person to put on and taking off regular lower body clothing?: A Lot 6 Click Score: 16   End of Session    Activity Tolerance: Patient limited by fatigue Patient left: in bed;with call bell/phone within reach;with bed alarm set  OT Visit Diagnosis: Other abnormalities of gait and  mobility (R26.89);Muscle weakness (generalized) (M62.81)                Time: 7289-7915 OT Time Calculation (min): 13 min Charges:  OT General Charges $OT Visit: 1 Visit OT Evaluation $OT Eval Moderate Complexity: 1 Mod  Ardeth Perfect., MPH, MS, OTR/L ascom [phone] 06/01/21, 12:49 PM

## 2021-06-01 NOTE — Evaluation (Signed)
Clinical/Bedside Swallow Evaluation Patient Details  Name: [name] MRN: [id number] Date of Birth: 1925/12/10  Today's Date: 06/01/2021 Time: SLP Start Time (ACUTE ONLY): 0830 SLP Stop Time (ACUTE ONLY): 0850 SLP Time Calculation (min) (ACUTE ONLY): 20 min  Past Medical History:  Past Medical History:  Diagnosis Date   A-fib ([place])    CHF (congestive heart failure) ([place])    Diabetes mellitus without complication ([place])    Hypertension    Past Surgical History:  Past Surgical History:  Procedure Laterality Date   AMPUTATION Right 10/10/2019   Procedure: AMPUTATION ABOVE KNEE;  Surgeon: Katha Cabal, MD;  Location: [facility];  Service: Vascular;  Laterality: Right;   CHOLECYSTECTOMY     LOWER EXTREMITY ANGIOGRAPHY Right 08/12/2017   Procedure: LOWER EXTREMITY ANGIOGRAPHY;  Surgeon: Katha Cabal, MD;  Location: [place] [facility];  Service: Cardiovascular;  Laterality: Right;   LOWER EXTREMITY ANGIOGRAPHY Right 06/13/2018   Procedure: LOWER EXTREMITY ANGIOGRAPHY;  Surgeon: Katha Cabal, MD;  Location: [place] [facility];  Service: Cardiovascular;  Laterality: Right;   LOWER EXTREMITY ANGIOGRAPHY Right 03/01/2019   Procedure: LOWER EXTREMITY ANGIOGRAPHY;  Surgeon: Katha Cabal, MD;  Location: [place] [facility];  Service: Cardiovascular;  Laterality: Right;   LOWER EXTREMITY ANGIOGRAPHY Right 07/31/2019   Procedure: LOWER EXTREMITY ANGIOGRAPHY;  Surgeon: Katha Cabal, MD;  Location: [place] [facility];  Service: Cardiovascular;  Laterality: Right;   LOWER EXTREMITY ANGIOGRAPHY Right 10/04/2019   Procedure: Lower Extremity Angiography;  Surgeon: Katha Cabal, MD;  Location: [place] [facility];  Service: Cardiovascular;  Laterality: Right;   HPI:  Per H&P "[name] is a 88 y.o. female with medical history significant for recent CVA with right-sided hemiparesis status post tPA administration, history of peripheral arterial disease status post  right AKA, diabetes mellitus with complications of stage IV chronic kidney disease, hypertension and dyslipidemia who was recently discharged from rehab following her hospitalization for acute stroke.  She was brought into the emergency room by private vehicle from home for evaluation of worsening shortness of breath.  Her son states that symptoms started 4 days prior to her presentation and she was seen by her primary care provider who recommended she resume her diuretic therapy and follow-up in 1 week.  Her children state that she has remained short of breath and now has a cough which is nonproductive.  He also states that she is very weak when compared to her baseline.  She denies having any fever or chills.  Upon arrival to the ER she was noted to be hypoxic with room air pulse oximetry of 85% and was placed on 3 L of oxygen.  She also had diffuse wheezes.  Family states that she has not had any difficulty swallowing and that she is on a regular diet at home.  Following her discharge speech had recommended mechanical soft diet with thin liquids which patient received while at rehab but since she has been home she has been on a regular diet.  Daughter notes difficulty swallowing large pills but otherwise states that she is able to tolerate her diet.  They also note that since the stroke she has word finding difficulties.   Unable to do a review of systems on this patient"   Pt s/p thoracentesis 05/31/21. CXR 05/31/21 "Slightly improved bilateral airspace opacities and likely decreased RIGHT pleural effusion. No pneumothorax."   Assessment / Plan / Recommendation  Clinical Impression  Pt seen for clinical  swallowing evaluation. Pt alert, pleasant, and cooperative. Upright in bed consuming breakfast. On 5L/min O2 via [place]. Cleared with RN.   Pt known to SLP services from previous admissions. Pt most recently seen 04/17/21 with recommendations for a clear liquid diet. Per SLP note on 05/30/21 "While pt's daughter  reports that pt is eating "regular food" when asked about specific food items, almost all items are considered to be mechanical soft in nature." P  Pt observed with consistencies from meal tray including scrambled eggs, diced sausage, coffee, and milk (via straw). Applesauce and regular solid also presented. Pt with a mildly prolonged mastication with regular solid. No observed difficulty with soft solids. Oral phase was functional with additional consistencies. No overt or subtle s/sx pharyngeal dysphagia noted. To palpation, pt with seemingly timely swallow initiation and seemingly adequate laryngeal elevation. No change to vocal quality across trials.  Recommend mech soft diet w/ moistened foods w/ thin liquids.  General Reflux and aspiration precautions. Recommend positioning and less talking/distraction during meals to allow pt to focus on oral intake. Recommend set up assistance with feeding. Recommend pills whole with water vs puree.   Pt is at increased risk for aspiration/aspiration PNA given AMS, multiple medical comorobidities, and overall deconditioning.   Pt and RN made aware of results, recommendations, and SLP POC. ?full understanding by pt.   SLP to f/u x1 for diet tolerance in setting of PNA.     SLP Visit Diagnosis: Dysphagia, oral phase (R13.11)    Aspiration Risk  Mild aspiration risk    Diet Recommendation Dysphagia 3 (Mech soft);Thin liquid   Medication Administration: Whole meds with liquid (vs puree) Supervision: Patient able to self feed;Intermittent supervision to cue for compensatory strategies Compensations: Minimize environmental distractions;Slow rate;Small sips/bites Postural Changes: Seated upright at 90 degrees;Remain upright for at least 30 minutes after po intake    Other  Recommendations Oral Care Recommendations: Oral care QID    Recommendations for follow up therapy are one component of a multi-disciplinary discharge planning process, led by the  attending physician.  Recommendations may be updated based on patient status, additional functional criteria and insurance authorization.  Follow up Recommendations  (TBD)      Assistance Recommended at Discharge Frequent or constant Supervision/Assistance  Functional Status Assessment Patient has had a recent decline in their functional status and demonstrates the ability to make significant improvements in function in a reasonable and predictable amount of time.  Frequency and Duration min 2x/week  2 weeks       Prognosis Prognosis for Safe Diet Advancement: Fair Barriers to Reach Goals:  (multiple medical comorbidities)      Swallow Study   General Date of Onset: 06/01/21 HPI: Per H&P "Neda Willenbring Lassalle is a 88 y.o. female with medical history significant for recent CVA with right-sided hemiparesis status post tPA administration, history of peripheral arterial disease status post right AKA, diabetes mellitus with complications of stage IV chronic kidney disease, hypertension and dyslipidemia who was recently discharged from rehab following her hospitalization for acute stroke.  She was brought into the emergency room by private vehicle from home for evaluation of worsening shortness of breath.  Her son states that symptoms started 4 days prior to her presentation and she was seen by her primary care provider who recommended she resume her diuretic therapy and follow-up in 1 week.  Her children state that she has remained short of breath and now has a cough which is nonproductive.  He also states that she  is very weak when compared to her baseline.  She denies having any fever or chills.  Upon arrival to the ER she was noted to be hypoxic with room air pulse oximetry of 85% and was placed on 3 L of oxygen.  She also had diffuse wheezes.  Family states that she has not had any difficulty swallowing and that she is on a regular diet at home.  Following her discharge speech had recommended mechanical  soft diet with thin liquids which patient received while at rehab but since she has been home she has been on a regular diet.  Daughter notes difficulty swallowing large pills but otherwise states that she is able to tolerate her diet.  They also note that since the stroke she has word finding difficulties.   Unable to do a review of systems on this patient" Type of Study: Bedside Swallow Evaluation Previous Swallow Assessment: known to SLP services from previous admissions Diet Prior to this Study: Dysphagia 2 (chopped);Thin liquids Temperature Spikes Noted: No Respiratory Status: Nasal cannula (5L/min) History of Recent Intubation: No Behavior/Cognition: Alert;Cooperative;Pleasant mood;Confused Oral Cavity Assessment: Within Functional Limits Oral Care Completed by SLP: Yes Oral Cavity - Dentition: Dentures, top;Dentures, bottom Vision: Functional for self-feeding Self-Feeding Abilities: Able to feed self;Needs set up Patient Positioning: Upright in bed Baseline Vocal Quality:  (dysphonic; ?tremor) Volitional Cough: Strong    Oral/Motor/Sensory Function Overall Oral Motor/Sensory Function: Within functional limits   Thin Liquid Thin Liquid: Within functional limits Presentation: Cup;Straw Other Comments: ~8 oz total    Puree Puree: Within functional limits Presentation: Self Fed;Spoon Other Comments: ~2 oz   Solid     Solid: Impaired Presentation: Self Fed Oral Phase Impairments: Impaired mastication Oral Phase Functional Implications: Impaired mastication Other Comments: mildly prolonged mastication with regular solid; functional with soft solid; x1 cracker; several bites from meal tray     Cherrie Gauze, M.S., [facility] [phone] ([place])   Clearnce Sorrel Jaelyn Cloninger 06/01/2021,9:36 AM

## 2021-06-01 NOTE — Progress Notes (Signed)
PULMONOLOGY         Date: 06/01/2021,   MRN# [id number] [name] [birth year]     AdmissionWeight: 57.8 kg                 CurrentWeight: 57.8 kg   Referring physician: Dr Reesa Chew   CHIEF COMPLAINT:   Acute hypoxemic respiratory failure   HISTORY OF PRESENT ILLNESS   88 yo F with Hx of CHF DM HTN AF on anticoagulation chronically.  Patient is unwell and sleeping during my evaluation but daughter is at bedside shares that patient is DNR and DNI but without dementia.    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   I reviewed Chest imaging independently and with family at bedside.  We looked at images together with findings of right upper lobe infiltrate, right moderate pleural effusion and diffuse bronchitic changes.   She is receiving rocephin and zithromax.   06/01/21- patietn is imporved with decreased O2 requirement s/p thoracentesis   PAST MEDICAL HISTORY   Past Medical History:  Diagnosis Date   A-fib [facility])    CHF (congestive heart failure) ([place])    Diabetes mellitus without complication ([place])    Hypertension      SURGICAL HISTORY   Past Surgical History:  Procedure Laterality Date   AMPUTATION Right 10/10/2019   Procedure: AMPUTATION ABOVE KNEE;  Surgeon: Katha Cabal, MD;  Location: [facility] ORS;  Service: Vascular;  Laterality: Right;   CHOLECYSTECTOMY     LOWER EXTREMITY ANGIOGRAPHY Right 08/12/2017   Procedure: LOWER EXTREMITY ANGIOGRAPHY;  Surgeon: Katha Cabal, MD;  Location: [place] [facility];  Service: Cardiovascular;  Laterality: Right;   LOWER EXTREMITY ANGIOGRAPHY Right 06/13/2018   Procedure: LOWER EXTREMITY ANGIOGRAPHY;  Surgeon: Katha Cabal, MD;  Location: [place] [facility];  Service: Cardiovascular;  Laterality: Right;   LOWER EXTREMITY ANGIOGRAPHY Right 03/01/2019   Procedure: LOWER EXTREMITY ANGIOGRAPHY;  Surgeon: Katha Cabal, MD;   Location: [place] [facility];  Service: Cardiovascular;  Laterality: Right;   LOWER EXTREMITY ANGIOGRAPHY Right 07/31/2019   Procedure: LOWER EXTREMITY ANGIOGRAPHY;  Surgeon: Katha Cabal, MD;  Location: [place] [facility];  Service: Cardiovascular;  Laterality: Right;   LOWER EXTREMITY ANGIOGRAPHY Right 10/04/2019   Procedure: Lower Extremity Angiography;  Surgeon: Katha Cabal, MD;  Location: [place] [facility];  Service: Cardiovascular;  Laterality: Right;     FAMILY HISTORY   Family History  Problem Relation Age of Onset   Cancer Brother      SOCIAL HISTORY   Social History   Tobacco Use   Smoking status: Never   Smokeless tobacco: Never  Vaping Use   Vaping Use: Never used  Substance Use Topics   Alcohol use: No   Drug use: No     MEDICATIONS    Home Medication:    Current Medication:  Current Facility-Administered Medications:    0.9 %  sodium chloride infusion, 250 mL, Intravenous, PRN, Agbata, Tochukwu, MD, Stopped at 06/01/21 0017   acetaminophen (TYLENOL) tablet 325-650 mg, 325-650 mg, Oral, Q6H PRN, Agbata, Tochukwu, MD   apixaban (ELIQUIS) tablet 2.5 mg, 2.5 mg, Oral, BID, Agbata, Tochukwu, MD, 2.5 mg at 06/01/21 0925   atorvastatin (LIPITOR) tablet 40 mg, 40 mg, Oral, Daily, Agbata, Tochukwu, MD, 40 mg at 06/01/21 0925   azithromycin (ZITHROMAX) 500 mg in sodium chloride 0.9 % 250 mL IVPB, 500  mg, Intravenous, Q24H, Agbata, Tochukwu, MD, Stopped at 05/31/21 1551   calcium carbonate (OS-CAL - dosed in mg of elemental calcium) tablet 1,250 mg, 1,250 mg, Oral, BID WC, Agbata, Tochukwu, MD, 1,250 mg at 06/01/21 0925   cefTRIAXone (ROCEPHIN) 2 g in sodium chloride 0.9 % 100 mL IVPB, 2 g, Intravenous, Q24H, Agbata, Tochukwu, MD, Stopped at 05/31/21 2332   furosemide (LASIX) injection 20 mg, 20 mg, Intravenous, BID, Amin, Ankit Chirag, MD, 20 mg at 06/01/21 0924   guaiFENesin-dextromethorphan (ROBITUSSIN DM) 100-10 MG/5ML syrup 10 mL, 10 mL, Oral,  Q4H PRN, Agbata, Tochukwu, MD, 10 mL at 05/31/21 0041   hydrALAZINE (APRESOLINE) injection 10 mg, 10 mg, Intravenous, Q4H PRN, Amin, Ankit Chirag, MD   insulin aspart (novoLOG) injection 0-9 Units, 0-9 Units, Subcutaneous, Q4H, Agbata, Tochukwu, MD, 1 Units at 06/01/21 0924   ipratropium-albuterol (DUONEB) 0.5-2.5 (3) MG/3ML nebulizer solution 3 mL, 3 mL, Nebulization, Q4H PRN, Amin, Ankit Chirag, MD   ipratropium-albuterol (DUONEB) 0.5-2.5 (3) MG/3ML nebulizer solution 3 mL, 3 mL, Nebulization, BID, Amin, Ankit Chirag, MD, 3 mL at 06/01/21 0740   levothyroxine (SYNTHROID) tablet 88 mcg, 88 mcg, Oral, QAC breakfast, Agbata, Tochukwu, MD, 88 mcg at 05/31/21 0557   lisinopril (ZESTRIL) tablet 2.5 mg, 2.5 mg, Oral, Daily, Lanney Gins, Sekai Nayak, MD, 2.5 mg at 06/01/21 7564   MEDLINE mouth rinse, 15 mL, Mouth Rinse, BID, Agbata, Tochukwu, MD, 15 mL at 06/01/21 0925   melatonin tablet 5 mg, 5 mg, Oral, QHS, Sharion Settler, NP, 5 mg at 05/31/21 2056   mirtazapine (REMERON) tablet 15 mg, 15 mg, Oral, QHS, Agbata, Tochukwu, MD, 15 mg at 05/31/21 2056   multivitamin-lutein (OCUVITE-LUTEIN) capsule 1 capsule, 1 capsule, Oral, QPM, Agbata, Tochukwu, MD, 1 capsule at 05/31/21 1735   ondansetron (ZOFRAN) tablet 4 mg, 4 mg, Oral, Q6H PRN **OR** ondansetron (ZOFRAN) injection 4 mg, 4 mg, Intravenous, Q6H PRN, Agbata, Tochukwu, MD   senna-docusate (Senokot-S) tablet 1 tablet, 1 tablet, Oral, QHS PRN, Amin, Ankit Chirag, MD   sodium chloride flush (NS) 0.9 % injection 3 mL, 3 mL, Intravenous, Q12H, Agbata, Tochukwu, MD, 3 mL at 05/31/21 2100   sodium chloride flush (NS) 0.9 % injection 3 mL, 3 mL, Intravenous, PRN, Agbata, Tochukwu, MD    ALLERGIES   Sulfa antibiotics     REVIEW OF SYSTEMS    Review of Systems:  Gen:  Denies  fever, sweats, chills weigh loss  HEENT: Denies blurred vision, double vision, ear pain, eye pain, hearing loss, nose bleeds, sore throat Cardiac:  No dizziness, chest pain or  heaviness, chest tightness,edema Resp:   Denies cough or sputum porduction, shortness of breath,wheezing, hemoptysis,  Gi: Denies swallowing difficulty, stomach pain, nausea or vomiting, diarrhea, constipation, bowel incontinence Gu:  Denies bladder incontinence, burning urine Ext:   Denies Joint pain, stiffness or swelling Skin: Denies  skin rash, easy bruising or bleeding or hives Endoc:  Denies polyuria, polydipsia , polyphagia or weight change Psych:   Denies depression, insomnia or hallucinations   Other:  All other systems negative   VS: BP (!) 145/57 (BP Location: Left Arm)    Pulse 70    Temp 97.7 F (36.5 C)    Resp 18    Ht 5\' 2"  (1.575 m)    Wt 57.8 kg    SpO2 93%    BMI 23.32 kg/m      PHYSICAL EXAM    GENERAL:NAD, no fevers, chills, no weakness no fatigue HEAD: Normocephalic, atraumatic.  EYES: Pupils equal, round,  reactive to light. Extraocular muscles intact. No scleral icterus.  MOUTH: Moist mucosal membrane. Dentition intact. No abscess noted.  EAR, NOSE, THROAT: Clear without exudates. No external lesions.  NECK: Supple. No thyromegaly. No nodules. No JVD.  PULMONARY: decreased breath sounds on right CARDIOVASCULAR: S1 and S2. Regular rate and rhythm. No murmurs, rubs, or gallops. No edema. Pedal pulses 2+ bilaterally.  GASTROINTESTINAL: Soft, nontender, nondistended. No masses. Positive bowel sounds. No hepatosplenomegaly.  MUSCULOSKELETAL: No swelling, clubbing, or edema. Range of motion full in all extremities.  NEUROLOGIC: Cranial nerves II through XII are intact. No gross focal neurological deficits. Sensation intact. Reflexes intact.  SKIN: No ulceration, lesions, rashes, or cyanosis. Skin warm and dry. Turgor intact.  PSYCHIATRIC: Mood, affect within normal limits. The patient is awake, alert and oriented x 3. Insight, judgment intact.       IMAGING    DG Chest 1 View  Result Date: 05/31/2021 CLINICAL DATA:  Status post RIGHT thoracentesis. EXAM:  CHEST  1 VIEW COMPARISON:  05/31/2021 and prior studies FINDINGS: Cardiomegaly again noted. Bilateral airspace opacities are slightly improved. RIGHT pleural effusion appears somewhat decreased. Bilateral LOWER lung consolidation/atelectasis again noted. There is no evidence of pneumothorax. IMPRESSION: Slightly improved bilateral airspace opacities and likely decreased RIGHT pleural effusion. No pneumothorax. Electronically Signed   By: Margarette Canada M.D.   On: 05/31/2021 14:18   CT HEAD WO CONTRAST (5MM)  Result Date: 05/30/2021 CLINICAL DATA:  Evaluate for subarachnoid hemorrhage. Complains of weakness. EXAM: CT HEAD WITHOUT CONTRAST TECHNIQUE: Contiguous axial images were obtained from the base of the skull through the vertex without intravenous contrast. RADIATION DOSE REDUCTION: This exam was performed according to the departmental dose-optimization program which includes automated exposure control, adjustment of the mA and/or kV according to patient size and/or use of iterative reconstruction technique. COMPARISON:  04/17/2021 FINDINGS: Brain: No evidence of acute infarction, hemorrhage, hydrocephalus, extra-axial collection or mass lesion/mass effect. Again noted are chronic left basal ganglia lacunar infarcts, image 16/2. There is mild diffuse low-attenuation within the subcortical and periventricular white matter compatible with chronic microvascular disease. Prominence of sulci and ventricles compatible with brain atrophy. Vascular: No hyperdense vessel or unexpected calcification. Skull: Postoperative change from right frontoparietal craniotomy. No fracture or focal lesion identified. Sinuses/Orbits: Unchanged chronic opacification of the right maxillary sinus. No acute findings. Other: None IMPRESSION: 1. No acute intracranial abnormalities. 2. Chronic small vessel ischemic disease and brain atrophy. 3. Chronic left basal ganglia lacunar infarcts. Electronically Signed   By: Kerby Moors M.D.   On:  05/30/2021 09:48   CT CHEST WO CONTRAST  Result Date: 05/30/2021 CLINICAL DATA:  Shortness of breath.  Pneumonia. EXAM: CT CHEST WITHOUT CONTRAST TECHNIQUE: Multidetector CT imaging of the chest was performed following the standard protocol without IV contrast. RADIATION DOSE REDUCTION: This exam was performed according to the departmental dose-optimization program which includes automated exposure control, adjustment of the mA and/or kV according to patient size and/or use of iterative reconstruction technique. COMPARISON:  None. FINDINGS: Cardiovascular: The heart size is enlarged. No substantial pericardial effusion. Moderate atherosclerotic calcification is noted in the wall of the thoracic aorta. Coronary artery calcification is evident. Mediastinum/Nodes: Mild mediastinal lymphadenopathy evident. 1.3 cm short axis precarinal node identified on image 60/2. 1.2 cm short axis subcarinal node identified on 74/2 No evidence for gross hilar lymphadenopathy although assessment is limited by the lack of intravenous contrast on the current study. The esophagus has normal imaging features. 1.6 cm right thyroid nodule evident. Lungs/Pleura: Interlobular  septal thickening with patchy ground-glass airspace disease noted right upper lobe. Consolidative airspace disease with some volume loss noted in the lower lobes bilaterally, right greater than left. Moderate right with tiny left pleural effusions. Upper Abdomen: Unremarkable. Musculoskeletal: No worrisome lytic or sclerotic osseous abnormality. Old posterior right rib fractures evident. IMPRESSION: 1. Moderate right with tiny left pleural effusions. 2. Patchy ground-glass opacity in the right upper lobe is associated with bibasilar collapse/consolidation, right greater than left. Imaging features suggest multifocal infection. Follow-up recommended to ensure resolution. 3. Mild mediastinal lymphadenopathy, likely reactive. This could also be reassessed at the time of  follow-up. 4. 1.6 cm right thyroid nodule. This has been evaluated on previous imaging. (ref: J Am Coll Radiol. 2015 Feb;12(2): 143-50). 5. Aortic Atherosclerosis (ICD10-I70.0). Electronically Signed   By: Misty Stanley M.D.   On: 05/30/2021 15:01   DG Chest Port 1 View  Result Date: 05/31/2021 CLINICAL DATA:  88 year old female with hypoxia. EXAM: PORTABLE CHEST 1 VIEW COMPARISON:  Chest CT and portable radiographs yesterday and earlier. FINDINGS: Portable AP upright view at 0512 hours. Combined veiling and confluent opacity throughout the right lung compatible with multilobar infection and pleural effusion demonstrated by CT yesterday. Lower lung volumes and right lung base ventilation has worsened. Contralateral left lower lobe atelectasis versus consolidation. Stable cardiomegaly and mediastinal contours. No pneumothorax. Stable visualized osseous structures. IMPRESSION: Lower lung volumes with worsening right lung ventilation from the CT yesterday most suggestive of multilobar right lung infection with pleural effusion. Ongoing left lung base atelectasis versus infection. Electronically Signed   By: Genevie Ann M.D.   On: 05/31/2021 05:32   DG Chest Port 1 View  Result Date: 05/30/2021 CLINICAL DATA:  Questionable sepsis EXAM: PORTABLE CHEST 1 VIEW COMPARISON:  Chest x-ray 04/16/2021 FINDINGS: Heart is enlarged. Mediastinum appears stable. Calcified plaques in the aortic arch. Extensive increased ground-glass opacities in the right lung with possible small right pleural effusion. Left lung appears grossly clear. No pneumothorax identified. IMPRESSION: Extensive opacities throughout the right lung which are new since previous study and concerning for pneumonia. Likely small right pleural effusion. Electronically Signed   By: Ofilia Neas M.D.   On: 05/30/2021 09:33   US THORACENTESIS ASP PLEURAL SPACE W/IMG GUIDE  Result Date: 05/31/2021 INDICATION: History of CHF, DM, HTN and AFib. Patient admitted  for shortness of breath, found to right pleural effusion. Request for diagnostic and therapeutic right-sided thoracentesis. EXAM: ULTRASOUND GUIDED DIAGNOSTIC THERAPEUTIC RIGHT THORACENTESIS MEDICATIONS: 10 mL 1 % lidocaine COMPLICATIONS: None immediate. PROCEDURE: An ultrasound guided thoracentesis was thoroughly discussed with the patient and questions answered. The benefits, risks, alternatives and complications were also discussed. The patient understands and wishes to proceed with the procedure. Written consent was obtained. Ultrasound was performed to localize and mark an adequate pocket of fluid in the right chest. The area was then prepped and draped in the normal sterile fashion. 1% Lidocaine was used for local anesthesia. Under ultrasound guidance a 6 Fr Safe-T-Centesis catheter was introduced. Thoracentesis was performed. The catheter was removed and a dressing applied. FINDINGS: A total of approximately 650 cc of clear, amber fluid was removed. Samples were sent to the laboratory as requested by the clinical team. IMPRESSION: Successful ultrasound guided right thoracentesis yielding 650 cc of pleural fluid. Read by: Narda Rutherford, AGNP-BC Electronically Signed   By: Markus Daft M.D.   On: 05/31/2021 15:38      ASSESSMENT/PLAN   Acute hypoxemic respiratory failure    Patient with compressive atelectasis due  to moderate pleural effusion with possible CAP vs pulmonary edema.  - she is on diuretics and antibiotics -patient is on eliquis and we are holding this for now due to ordered thoracentesis   Moderate right pleural effusion    -hold eliquis for now   -thoracentesis ordered - patient is agreeable to procedure.    Chronic renal failure   - due to hypotension and ongoing diuresis , will hold amlodipine for now and reduce lisinopril to 2.5 mg po daily    Chronic systolic CHF  TTE with low normal systolic function - ongoing diuresis   Patient with multiple severe active comorbid  conditions including s/p CVA , amputation status, CHF, hypoxemic failure and is a nanogenerian     Thank you for allowing me to participate in the care of this patient.   Patient/Family are satisfied with care plan and all questions have been answered.  This document was prepared using Dragon voice recognition software and may include unintentional dictation errors.     Ottie Glazier, M.D.  Division of [facility]

## 2021-06-01 NOTE — TOC Progression Note (Signed)
Transition of Care [facility]) - Progression Note    Patient Details  Name: [name] MRN: [id number] Date of Birth: [birth year]  [facility] CM/SW Contact  [name], RN Phone Number: 06/01/2021, 1:06 PM  Clinical Narrative:     Josem Kaufmann still pending        Expected Discharge Plan and Services                                                 Social Determinants of Health (SDOH) Interventions    Readmission Risk Interventions No flowsheet data found.

## 2021-06-01 NOTE — Plan of Care (Signed)

## 2021-06-02 ENCOUNTER — Inpatient Hospital Stay: Payer: Medicare Other

## 2021-06-02 LAB — CBC
HCT: 36.3 % (ref 36.0–46.0)
Hemoglobin: 11.8 g/dL — ABNORMAL LOW (ref 12.0–15.0)
MCH: 30.6 pg (ref 26.0–34.0)
MCHC: 32.5 g/dL (ref 30.0–36.0)
MCV: 94.3 fL (ref 80.0–100.0)
Platelets: 218 10*3/uL (ref 150–400)
RBC: 3.85 MIL/uL — ABNORMAL LOW (ref 3.87–5.11)
RDW: 12.5 % (ref 11.5–15.5)
WBC: 7.2 10*3/uL (ref 4.0–10.5)
nRBC: 0 % (ref 0.0–0.2)

## 2021-06-02 LAB — BASIC METABOLIC PANEL
Anion gap: 9 (ref 5–15)
BUN: 38 mg/dL — ABNORMAL HIGH (ref 8–23)
CO2: 28 mmol/L (ref 22–32)
Calcium: 8.6 mg/dL — ABNORMAL LOW (ref 8.9–10.3)
Chloride: 107 mmol/L (ref 98–111)
Creatinine, Ser: 1.6 mg/dL — ABNORMAL HIGH (ref 0.44–1.00)
GFR, Estimated: 30 mL/min — ABNORMAL LOW (ref >60)
Glucose, Bld: 132 mg/dL — ABNORMAL HIGH (ref 70–99)
Potassium: 3.9 mmol/L (ref 3.5–5.1)
Sodium: 144 mmol/L (ref 135–145)

## 2021-06-02 LAB — MISC LABCORP TEST (SEND OUT): Labcorp test code: 9985

## 2021-06-02 LAB — GLUCOSE, CAPILLARY
Glucose-Capillary: 123 mg/dL — ABNORMAL HIGH (ref 70–99)
Glucose-Capillary: 113 mg/dL — ABNORMAL HIGH (ref 70–99)
Glucose-Capillary: 126 mg/dL — ABNORMAL HIGH (ref 70–99)
Glucose-Capillary: 162 mg/dL — ABNORMAL HIGH (ref 70–99)
Glucose-Capillary: 205 mg/dL — ABNORMAL HIGH (ref 70–99)

## 2021-06-02 LAB — MAGNESIUM: Magnesium: 1.9 mg/dL (ref 1.7–2.4)

## 2021-06-02 MED ORDER — INSULIN ASPART 100 UNIT/ML IJ SOLN
0.0000 [IU] | Freq: Every day | INTRAMUSCULAR | Status: DC
  Administered 2021-06-02: 2 [IU] via SUBCUTANEOUS
  Filled 2021-06-02: qty 1

## 2021-06-02 MED ORDER — LISINOPRIL 10 MG PO TABS
5.0000 mg | ORAL_TABLET | Freq: Every day | ORAL | Status: DC
  Administered 2021-06-03: 5 mg via ORAL
  Filled 2021-06-02: qty 1

## 2021-06-02 MED ORDER — INSULIN ASPART 100 UNIT/ML IJ SOLN
0.0000 [IU] | Freq: Three times a day (TID) | INTRAMUSCULAR | Status: DC
  Administered 2021-06-02 – 2021-06-03 (×2): 2 [IU] via SUBCUTANEOUS
  Filled 2021-06-02 (×2): qty 1

## 2021-06-02 MED ORDER — AZITHROMYCIN 250 MG PO TABS
500.0000 mg | ORAL_TABLET | Freq: Every day | ORAL | Status: AC
  Administered 2021-06-02 – 2021-06-03 (×2): 500 mg via ORAL
  Filled 2021-06-02 (×2): qty 2

## 2021-06-02 NOTE — Progress Notes (Signed)
Mobility Specialist - Progress Note   06/02/21 1100  Mobility  Activity Transferred from bed to chair;Dangled on edge of bed  Range of Motion/Exercises Active  Level of Assistance Moderate assist, patient does 50-74%  Assistive Device Front wheel walker  Activity Response Tolerated well  $Mobility charge 1 Mobility    Pt lying in bed upon arrival, utilizing 2L. Pt achieved EOB with modA and participated in seated ADL tasks (brushed teeth, washed face) with set-up assist. Good sitting balance. Pt stood at bedside with minA and SPT to chair with +2 assist for safety. Family at bedside able to assist with transfer. O2 maintained > 90% most of session, does desat to 84% with transfer but was able to self recover once seated. Does voice nausea at the end of session. RN notified. Pt left in chair with alarm set, needs in reach.    Kathee Delton Mobility Specialist 06/02/21, 11:41 AM

## 2021-06-02 NOTE — Progress Notes (Signed)
PROGRESS NOTE    [name]  QQV:[id number] DOB: April 27, 1925 DOA: 05/30/2021 PCP: Sofie Hartigan, MD   Brief Narrative:  88 year old with history of recent CVA status post tPA, PAD status post right-sided AKA, DM 2, CKD stage IIIa, HTN, HLD admitted to the hospital for shortness of breath.  She was found to have acute respiratory distress likely from fluid overload, right-sided pleural effusion with possibly TOF multifocal pneumonia.  She was started on IV diuretics, pulmonary was consulted for thoracentesis.  She was empirically started on IV Rocephin and azithromycin as well.  Pulmonary was consulted and she underwent thoracentesis on 2/19.  650 cc was removed.  Study showed transudative fluid, no organisms were seen on micro.  Her oxygen has been weaned down to 2 L today.  We will continue IV diuretics and hopefully home tomorrow.     Assessment & Plan:  Principal Problem:   Acute respiratory failure with hypoxia (HCC) Active Problems:   Pneumonia   Acute on chronic diastolic CHF (congestive heart failure) (HCC)   Stage 3a chronic kidney disease (CKD) (HCC)   PAD (peripheral artery disease) (HCC)   Type 2 diabetes mellitus with stage 4 chronic kidney disease, without long-term current use of insulin (HCC)   Hyperlipidemia   Essential hypertension   Hypothyroidism   Mild aortic valve stenosis   DNR (do not resuscitate)   Phantom pain after amputation of lower extremity ([place])   Paroxysmal atrial fibrillation ([place])     Assessment and Plan: * Acute respiratory failure with hypoxia ([place])- (present on admission) CT of the chest shows multifocal infection, moderate right-sided pleural effusion, mediastinal lymphadenopathy and previously known 1.6 cm right-sided thyroid nodule Currently on 2 L nasal cannula.  We will attempt to wean this off. ProCal 0.23; BNP 600 On empiric IV Rocephin and azithromycin Bronchodilators Echocardiogram January 2022 showed EF 55% Seen by pulmonary.   Status post thoracentesis, 650 cc removed.  Transudative fluid.  No organisms seen on blood cultures.  Stage 3a chronic kidney disease (CKD) (HCC) Creatinine is around baseline of 1.6.  Continue to closely monitor this.  Acute on chronic diastolic CHF (congestive heart failure) (HCC) Echocardiogram in January 2020 showed EF of 55%.  Elevated BNP, status post thoracentesis 2/19, 650 cc removed. Continue Lasix 20 mg IV twice daily.  Seems to be responding well.  Pneumonia- (present on admission) Multifocal pneumonia seen on the CT.  Currently on empiric IV Rocephin and azithromycin.  Bronchodilators.  Procal 0.23  Paroxysmal atrial fibrillation ([place])- (present on admission) Chronic.  Rate controlled Continue Eliquis  Phantom pain after amputation of lower extremity ([place])- (present on admission) Secondary to history of AKA  DNR (do not resuscitate)- (present on admission) CODE STATUS confirmed-DNR  Mild aortic valve stenosis Seen on previous echocardiogram.  Currently chest pain-free.  Not symptomatic  Hypothyroidism- (present on admission) Synthroid  Essential hypertension Cont IV lasix. Norvasc held Lisinopril reduced 2.5mg  po daily  Hyperlipidemia- (present on admission) Daily Lipitor  Type 2 diabetes mellitus with stage 4 chronic kidney disease, without long-term current use of insulin ([place])- (present on admission) Sliding scale and Accu-Cheks A1c 7.18 Apr 2021  PAD (peripheral artery disease) ([place])- (present on admission) On Eliquis and statin.  Has history of AKA          DVT prophylaxis: apixaban (ELIQUIS) tablet 2.5 mg Start: 05/30/21 2200 apixaban (ELIQUIS) tablet 2.5 mg  Code Status: DNR Family Communication: Son at bedside  Status is: Inpatient Remains inpatient appropriate because: This  morning still on 2 L nasal cannula, delirious.  Hopefully will continue to improve over next 24 hours.  Home health orders placed      Subjective:  During my  visit this morning, son was at bedside.  Patient was delirious and Thinking her son is her husband.  Did not have any evidence of shortness of breath especially at rest.  She is still on 2 L nasal cannula and desaturates down to lower 90s when attempted to take her off.   Examination:  Constitutional: Not in acute distress, overall frail and weak.  2 L nasal cannula Respiratory: Bibasilar crackles Cardiovascular: Normal sinus rhythm, no rubs Abdomen: Nontender nondistended good bowel sounds Musculoskeletal: No edema noted Skin: No rashes seen Neurologic: CN 2-12 grossly intact.  And nonfocal Psychiatric: Delirious this morning.  Alert to her name only. Objective: Vitals:   06/02/21 0356 06/02/21 0500 06/02/21 0723 06/02/21 0800  BP: 99/81   (!) 154/105  Pulse: 65   68  Resp: 18   18  Temp: 97.6 F (36.4 C)   98.2 F (36.8 C)  TempSrc:    Oral  SpO2: 100% 96% 95% 99%  Weight:      Height:        Intake/Output Summary (Last 24 hours) at 06/02/2021 1142 Last data filed at 06/02/2021 0433 Gross per 24 hour  Intake 598.01 ml  Output 1450 ml  Net -851.99 ml   Filed Weights   05/30/21 0852  Weight: 57.8 kg     Data Reviewed:   CBC: Recent Labs  Lab 05/30/21 0904 05/31/21 0534 06/01/21 0530 06/02/21 0404  WBC 12.7* 10.6* 10.1 7.2  NEUTROABS 9.8*  --   --   --   HGB 13.3 11.9* 11.8* 11.8*  HCT 41.7 37.8 35.6* 36.3  MCV 95.4 96.9 93.2 94.3  PLT 251 225 235 161   Basic Metabolic Panel: Recent Labs  Lab 05/30/21 0904 05/31/21 0534 06/01/21 0530 06/02/21 0404  NA 142 146* 143 144  K 4.3 4.4 3.6 3.9  CL 109 112* 110 107  CO2 24 24 26 28   GLUCOSE 230* 143* 134* 132*  BUN 27* 31* 36* 38*  CREATININE 1.78* 1.69* 1.68* 1.60*  CALCIUM 9.1 8.9 8.7* 8.6*  MG  --   --  1.9 1.9   GFR: Estimated Creatinine Clearance: 17.4 mL/min (A) (by C-G formula based on SCr of 1.6 mg/dL (H)). Liver Function Tests: Recent Labs  Lab 05/30/21 0904  AST 45*  ALT 36  ALKPHOS 105   BILITOT 1.2  PROT 8.1  ALBUMIN 3.4*   No results for input(s): LIPASE, AMYLASE in the last 168 hours. No results for input(s): AMMONIA in the last 168 hours. Coagulation Profile: Recent Labs  Lab 05/30/21 0904  INR 1.3*   Cardiac Enzymes: No results for input(s): CKTOTAL, CKMB, CKMBINDEX, TROPONINI in the last 168 hours. BNP (last 3 results) No results for input(s): PROBNP in the last 8760 hours. HbA1C: No results for input(s): HGBA1C in the last 72 hours. CBG: Recent Labs  Lab 06/01/21 1534 06/01/21 1940 06/01/21 2348 06/02/21 0348 06/02/21 0804  GLUCAP 221* 216* 118* 123* 113*   Lipid Profile: No results for input(s): CHOL, HDL, LDLCALC, TRIG, CHOLHDL, LDLDIRECT in the last 72 hours. Thyroid Function Tests: Recent Labs    05/31/21 0534  TSH 0.967   Anemia Panel: No results for input(s): VITAMINB12, FOLATE, FERRITIN, TIBC, IRON, RETICCTPCT in the last 72 hours. Sepsis Labs: Recent Labs  Lab 05/30/21 [id number] 05/30/21 4540  05/30/21 1106 05/31/21 0534 05/31/21 1125  PROCALCITON 0.11  --   --  0.23 <0.10  LATICACIDVEN  --  1.1 0.9  --   --     Recent Results (from the past 240 hour(s))  Resp Panel by RT-PCR (Flu A&B, Covid) Nasopharyngeal Swab     Status: None   Collection Time: 05/30/21  9:27 AM   Specimen: Nasopharyngeal Swab; Nasopharyngeal(NP) swabs in vial transport medium  Result Value Ref Range Status   SARS Coronavirus 2 by RT PCR NEGATIVE NEGATIVE Final    Comment: (NOTE) SARS-CoV-2 target nucleic acids are NOT DETECTED.  The SARS-CoV-2 RNA is generally detectable in upper respiratory specimens during the acute phase of infection. The lowest concentration of SARS-CoV-2 viral copies this assay can detect is 138 copies/mL. A negative result does not preclude SARS-Cov-2 infection and should not be used as the sole basis for treatment or other patient management decisions. A negative result may occur with  improper specimen collection/handling,  submission of specimen other than nasopharyngeal swab, presence of viral mutation(s) within the areas targeted by this assay, and inadequate number of viral copies(<138 copies/mL). A negative result must be combined with clinical observations, patient history, and epidemiological information. The expected result is Negative.  Fact Sheet for Patients:  EntrepreneurPulse.com.au  Fact Sheet for Healthcare Providers:  IncredibleEmployment.be  This test is no t yet approved or cleared by the [place] FDA and  has been authorized for detection and/or diagnosis of SARS-CoV-2 by FDA under an Emergency Use Authorization (EUA). This EUA will remain  in effect (meaning this test can be used) for the duration of the COVID-19 declaration under Section 564(b)(1) of the Act, 21 U.S.C.section 360bbb-3(b)(1), unless the authorization is terminated  or revoked sooner.       Influenza A by PCR NEGATIVE NEGATIVE Final   Influenza B by PCR NEGATIVE NEGATIVE Final    Comment: (NOTE) The Xpert Xpress SARS-CoV-2/FLU/RSV plus assay is intended as an aid in the diagnosis of influenza from Nasopharyngeal swab specimens and should not be used as a sole basis for treatment. Nasal washings and aspirates are unacceptable for Xpert Xpress SARS-CoV-2/FLU/RSV testing.  Fact Sheet for Patients: EntrepreneurPulse.com.au  Fact Sheet for Healthcare Providers: IncredibleEmployment.be  This test is not yet approved or cleared by the [place] FDA and has been authorized for detection and/or diagnosis of SARS-CoV-2 by FDA under an Emergency Use Authorization (EUA). This EUA will remain in effect (meaning this test can be used) for the duration of the COVID-19 declaration under Section 564(b)(1) of the Act, 21 U.S.C. section 360bbb-3(b)(1), unless the authorization is terminated or revoked.  Performed at [facility], [address] [place], [place] [address]   Blood Culture (routine x 2)     Status: None (Preliminary result)   Collection Time: 05/30/21  9:27 AM   Specimen: BLOOD  Result Value Ref Range Status   Specimen Description BLOOD LEFT ANTECUBITAL  Final   Special Requests   Final    BOTTLES DRAWN AEROBIC AND ANAEROBIC Blood Culture adequate volume   Culture   Final    NO GROWTH 3 DAYS Performed at [facility], [address] [place], [place] [address]    Report Status PENDING  Incomplete  Blood Culture (routine x 2)     Status: None (Preliminary result)   Collection Time: 05/30/21  9:27 AM   Specimen: BLOOD  Result Value Ref Range Status   Specimen Description BLOOD RIGHT ANTECUBITAL  Final  Special Requests   Final    BOTTLES DRAWN AEROBIC AND ANAEROBIC Blood Culture adequate volume   Culture   Final    NO GROWTH 3 DAYS Performed at [facility], [address] [place], [place] [address]    Report Status PENDING  Incomplete  MRSA Next Gen by PCR, Nasal     Status: None   Collection Time: 05/31/21 11:04 AM   Specimen: Nasal Mucosa; Nasal Swab  Result Value Ref Range Status   MRSA by PCR Next Gen NOT DETECTED NOT DETECTED Final    Comment: (NOTE) The GeneXpert MRSA Assay (FDA approved for NASAL specimens only), is one component of a comprehensive MRSA colonization surveillance program. It is not intended to diagnose MRSA infection nor to guide or monitor treatment for MRSA infections. Test performance is not FDA approved in patients less than 35 years old. Performed at [facility], [address] [place], [place] [address]   Body fluid culture w Gram Stain     Status: None (Preliminary result)   Collection Time: 05/31/21  2:13 PM   Specimen: PATH Cytology Pleural fluid  Result Value Ref Range Status   Specimen Description   Final    PLEURAL FLUID Performed at [facility], [address], [place], [place] [address]    Special  Requests   Final    NONE Performed at [facility], [address] [place], [place] [address]    Gram Stain   Final    WBC PRESENT,BOTH PMN AND MONONUCLEAR NO ORGANISMS SEEN CYTOSPIN SMEAR    Culture   Final    NO GROWTH 2 DAYS Performed at [facility], [place] [address] [place], [place] [address]    Report Status PENDING  Incomplete         Radiology Studies: DG Chest 1 View  Result Date: 05/31/2021 CLINICAL DATA:  Status post RIGHT thoracentesis. EXAM: CHEST  1 VIEW COMPARISON:  05/31/2021 and prior studies FINDINGS: Cardiomegaly again noted. Bilateral airspace opacities are slightly improved. RIGHT pleural effusion appears somewhat decreased. Bilateral LOWER lung consolidation/atelectasis again noted. There is no evidence of pneumothorax. IMPRESSION: Slightly improved bilateral airspace opacities and likely decreased RIGHT pleural effusion. No pneumothorax. Electronically Signed   By: Margarette Canada M.D.   On: 05/31/2021 14:18   US THORACENTESIS ASP PLEURAL SPACE W/IMG GUIDE  Result Date: 05/31/2021 INDICATION: History of CHF, DM, HTN and AFib. Patient admitted for shortness of breath, found to right pleural effusion. Request for diagnostic and therapeutic right-sided thoracentesis. EXAM: ULTRASOUND GUIDED DIAGNOSTIC THERAPEUTIC RIGHT THORACENTESIS MEDICATIONS: 10 mL 1 % lidocaine COMPLICATIONS: None immediate. PROCEDURE: An ultrasound guided thoracentesis was thoroughly discussed with the patient and questions answered. The benefits, risks, alternatives and complications were also discussed. The patient understands and wishes to proceed with the procedure. Written consent was obtained. Ultrasound was performed to localize and mark an adequate pocket of fluid in the right chest. The area was then prepped and draped in the normal sterile fashion. 1% Lidocaine was used for local anesthesia. Under ultrasound guidance a 6 Fr Safe-T-Centesis catheter was introduced.  Thoracentesis was performed. The catheter was removed and a dressing applied. FINDINGS: A total of approximately 650 cc of clear, amber fluid was removed. Samples were sent to the laboratory as requested by the clinical team. IMPRESSION: Successful ultrasound guided right thoracentesis yielding 650 cc of pleural fluid. Read by: Narda Rutherford, AGNP-BC Electronically Signed   By: Markus Daft M.D.   On: 05/31/2021  15:38        Scheduled Meds:  apixaban  2.5 mg Oral BID   atorvastatin  40 mg Oral Daily   calcium carbonate  1,250 mg Oral BID WC   furosemide  20 mg Intravenous BID   insulin aspart  0-5 Units Subcutaneous QHS   insulin aspart  0-9 Units Subcutaneous TID WC   ipratropium-albuterol  3 mL Nebulization BID   levothyroxine  88 mcg Oral QAC breakfast   lisinopril  2.5 mg Oral Daily   mouth rinse  15 mL Mouth Rinse BID   melatonin  5 mg Oral QHS   mirtazapine  15 mg Oral QHS   multivitamin-lutein  1 capsule Oral QPM   sodium chloride flush  3 mL Intravenous Q12H   Continuous Infusions:  sodium chloride Stopped (06/02/21 0053)   azithromycin Stopped (06/01/21 1524)   cefTRIAXone (ROCEPHIN)  IV Stopped (06/02/21 0016)     LOS: 3 days   Time spent= 35 mins    Demica Zook Arsenio Loader, MD [facility]  If 7PM-7AM, please contact night-coverage  06/02/2021, 11:42 AM

## 2021-06-02 NOTE — Progress Notes (Signed)
PULMONOLOGY         Date: 06/02/2021,   MRN# [id number] [name] 01-Jul-1925     AdmissionWeight: 57.8 kg                 CurrentWeight: 57.8 kg   Referring physician: Dr Reesa Chew   CHIEF COMPLAINT:   Acute hypoxemic respiratory failure   HISTORY OF PRESENT ILLNESS   88 yo F with Hx of CHF DM HTN AF on anticoagulation chronically.  Patient is unwell and sleeping during my evaluation but daughter is at bedside shares that patient is DNR and DNI but without dementia.    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   I reviewed Chest imaging independently and with family at bedside.  We looked at images together with findings of right upper lobe infiltrate, right moderate pleural effusion and diffuse bronchitic changes.   She is receiving rocephin and zithromax.   06/02/21- patient on 2L/min [place].  She does have atelectasis and would benefit from IS/flutter valve.  She is weak but should be OOB to chair orders placed. She is not edematous on leg s/p amputation on right.    PAST MEDICAL HISTORY   Past Medical History:  Diagnosis Date   A-fib [facility])    CHF (congestive heart failure) ([place])    Diabetes mellitus without complication ([place])    Hypertension      SURGICAL HISTORY   Past Surgical History:  Procedure Laterality Date   AMPUTATION Right 10/10/2019   Procedure: AMPUTATION ABOVE KNEE;  Surgeon: Katha Cabal, MD;  Location: [facility] ORS;  Service: Vascular;  Laterality: Right;   CHOLECYSTECTOMY     LOWER EXTREMITY ANGIOGRAPHY Right 08/12/2017   Procedure: LOWER EXTREMITY ANGIOGRAPHY;  Surgeon: Katha Cabal, MD;  Location: [place] [facility];  Service: Cardiovascular;  Laterality: Right;   LOWER EXTREMITY ANGIOGRAPHY Right 06/13/2018   Procedure: LOWER EXTREMITY ANGIOGRAPHY;  Surgeon: Katha Cabal, MD;  Location: [place] [facility];  Service: Cardiovascular;  Laterality:  Right;   LOWER EXTREMITY ANGIOGRAPHY Right 03/01/2019   Procedure: LOWER EXTREMITY ANGIOGRAPHY;  Surgeon: Katha Cabal, MD;  Location: [place] [facility];  Service: Cardiovascular;  Laterality: Right;   LOWER EXTREMITY ANGIOGRAPHY Right 07/31/2019   Procedure: LOWER EXTREMITY ANGIOGRAPHY;  Surgeon: Katha Cabal, MD;  Location: [place] [facility];  Service: Cardiovascular;  Laterality: Right;   LOWER EXTREMITY ANGIOGRAPHY Right 10/04/2019   Procedure: Lower Extremity Angiography;  Surgeon: Katha Cabal, MD;  Location: [place] CV LAB;  Service: Cardiovascular;  Laterality: Right;     FAMILY HISTORY   Family History  Problem Relation Age of Onset   Cancer Brother      SOCIAL HISTORY   Social History   Tobacco Use   Smoking status: Never   Smokeless tobacco: Never  Vaping Use   Vaping Use: Never used  Substance Use Topics   Alcohol use: No   Drug use: No     MEDICATIONS    Home Medication:    Current Medication:  Current Facility-Administered Medications:    0.9 %  sodium chloride infusion, 250 mL, Intravenous, PRN, Agbata, Tochukwu, MD, Stopped at 06/02/21 0053   acetaminophen (TYLENOL) tablet 325-650 mg, 325-650 mg, Oral, Q6H PRN, Agbata, Tochukwu, MD, 650 mg at 06/01/21 1609   apixaban (ELIQUIS) tablet 2.5 mg, 2.5 mg, Oral, BID, Agbata, Tochukwu, MD, 2.5 mg at 06/01/21 2002  atorvastatin (LIPITOR) tablet 40 mg, 40 mg, Oral, Daily, Agbata, Tochukwu, MD, 40 mg at 06/01/21 0925   azithromycin (ZITHROMAX) tablet 500 mg, 500 mg, Oral, Daily, Rauer, Samantha O, RPH, 500 mg at 06/02/21 1457   calcium carbonate (OS-CAL - dosed in mg of elemental calcium) tablet 1,250 mg, 1,250 mg, Oral, BID WC, Agbata, Tochukwu, MD, 1,250 mg at 06/01/21 1609   cefTRIAXone (ROCEPHIN) 2 g in sodium chloride 0.9 % 100 mL IVPB, 2 g, Intravenous, Q24H, Agbata, Tochukwu, MD, Stopped at 06/02/21 0016   furosemide (LASIX) injection 20 mg, 20 mg, Intravenous, BID, Amin, Ankit  Chirag, MD, 20 mg at 06/02/21 0909   guaiFENesin-dextromethorphan (ROBITUSSIN DM) 100-10 MG/5ML syrup 10 mL, 10 mL, Oral, Q4H PRN, Agbata, Tochukwu, MD, 10 mL at 05/31/21 0041   hydrALAZINE (APRESOLINE) injection 10 mg, 10 mg, Intravenous, Q4H PRN, Amin, Ankit Chirag, MD   insulin aspart (novoLOG) injection 0-5 Units, 0-5 Units, Subcutaneous, QHS, Amin, Ankit Chirag, MD   insulin aspart (novoLOG) injection 0-9 Units, 0-9 Units, Subcutaneous, TID WC, Amin, Ankit Chirag, MD   ipratropium-albuterol (DUONEB) 0.5-2.5 (3) MG/3ML nebulizer solution 3 mL, 3 mL, Nebulization, BID, Amin, Ankit Chirag, MD, 3 mL at 06/02/21 0723   ipratropium-albuterol (DUONEB) 0.5-2.5 (3) MG/3ML nebulizer solution 3 mL, 3 mL, Nebulization, Q6H PRN, Amin, Ankit Chirag, MD   levothyroxine (SYNTHROID) tablet 88 mcg, 88 mcg, Oral, QAC breakfast, Agbata, Tochukwu, MD, 88 mcg at 06/02/21 0524   lisinopril (ZESTRIL) tablet 2.5 mg, 2.5 mg, Oral, Daily, Ottie Glazier, MD, 2.5 mg at 06/01/21 7902   MEDLINE mouth rinse, 15 mL, Mouth Rinse, BID, Agbata, Tochukwu, MD, 15 mL at 06/02/21 0908   melatonin tablet 5 mg, 5 mg, Oral, QHS, Sharion Settler, NP, 5 mg at 06/01/21 2001   mirtazapine (REMERON) tablet 15 mg, 15 mg, Oral, QHS, Agbata, Tochukwu, MD, 15 mg at 06/01/21 2002   multivitamin-lutein (OCUVITE-LUTEIN) capsule 1 capsule, 1 capsule, Oral, QPM, Agbata, Tochukwu, MD, 1 capsule at 06/01/21 1743   ondansetron (ZOFRAN) tablet 4 mg, 4 mg, Oral, Q6H PRN **OR** ondansetron (ZOFRAN) injection 4 mg, 4 mg, Intravenous, Q6H PRN, Agbata, Tochukwu, MD   senna-docusate (Senokot-S) tablet 1 tablet, 1 tablet, Oral, QHS PRN, Amin, Ankit Chirag, MD   sodium chloride flush (NS) 0.9 % injection 3 mL, 3 mL, Intravenous, Q12H, Agbata, Tochukwu, MD, 3 mL at 06/02/21 0910   sodium chloride flush (NS) 0.9 % injection 3 mL, 3 mL, Intravenous, PRN, Agbata, Tochukwu, MD    ALLERGIES   Sulfa antibiotics     REVIEW OF SYSTEMS    Review of  Systems:  Gen:  Denies  fever, sweats, chills weigh loss  HEENT: Denies blurred vision, double vision, ear pain, eye pain, hearing loss, nose bleeds, sore throat Cardiac:  No dizziness, chest pain or heaviness, chest tightness,edema Resp:   Denies cough or sputum porduction, shortness of breath,wheezing, hemoptysis,  Gi: Denies swallowing difficulty, stomach pain, nausea or vomiting, diarrhea, constipation, bowel incontinence Gu:  Denies bladder incontinence, burning urine Ext:   Denies Joint pain, stiffness or swelling Skin: Denies  skin rash, easy bruising or bleeding or hives Endoc:  Denies polyuria, polydipsia , polyphagia or weight change Psych:   Denies depression, insomnia or hallucinations   Other:  All other systems negative   VS: BP (!) 144/64 (BP Location: Left Arm)    Pulse 76    Temp 98.5 F (36.9 C) (Oral)    Resp 18    Ht 5\' 2"  (1.575 m)  Wt 57.8 kg    SpO2 96%    BMI 23.32 kg/m      PHYSICAL EXAM    GENERAL:NAD, no fevers, chills, no weakness no fatigue HEAD: Normocephalic, atraumatic.  EYES: Pupils equal, round, reactive to light. Extraocular muscles intact. No scleral icterus.  MOUTH: Moist mucosal membrane. Dentition intact. No abscess noted.  EAR, NOSE, THROAT: Clear without exudates. No external lesions.  NECK: Supple. No thyromegaly. No nodules. No JVD.  PULMONARY: decreased breath sounds on right CARDIOVASCULAR: S1 and S2. Regular rate and rhythm. No murmurs, rubs, or gallops. No edema. Pedal pulses 2+ bilaterally.  GASTROINTESTINAL: Soft, nontender, nondistended. No masses. Positive bowel sounds. No hepatosplenomegaly.  MUSCULOSKELETAL: No swelling, clubbing, or edema. Range of motion full in all extremities.  NEUROLOGIC: Cranial nerves II through XII are intact. No gross focal neurological deficits. Sensation intact. Reflexes intact.  SKIN: No ulceration, lesions, rashes, or cyanosis. Skin warm and dry. Turgor intact.  PSYCHIATRIC: Mood, affect within  normal limits. The patient is awake, alert and oriented x 3. Insight, judgment intact.       IMAGING    DG Chest 1 View  Result Date: 05/31/2021 CLINICAL DATA:  Status post RIGHT thoracentesis. EXAM: CHEST  1 VIEW COMPARISON:  05/31/2021 and prior studies FINDINGS: Cardiomegaly again noted. Bilateral airspace opacities are slightly improved. RIGHT pleural effusion appears somewhat decreased. Bilateral LOWER lung consolidation/atelectasis again noted. There is no evidence of pneumothorax. IMPRESSION: Slightly improved bilateral airspace opacities and likely decreased RIGHT pleural effusion. No pneumothorax. Electronically Signed   By: Margarette Canada M.D.   On: 05/31/2021 14:18   CT HEAD WO CONTRAST (5MM)  Result Date: 05/30/2021 CLINICAL DATA:  Evaluate for subarachnoid hemorrhage. Complains of weakness. EXAM: CT HEAD WITHOUT CONTRAST TECHNIQUE: Contiguous axial images were obtained from the base of the skull through the vertex without intravenous contrast. RADIATION DOSE REDUCTION: This exam was performed according to the departmental dose-optimization program which includes automated exposure control, adjustment of the mA and/or kV according to patient size and/or use of iterative reconstruction technique. COMPARISON:  04/17/2021 FINDINGS: Brain: No evidence of acute infarction, hemorrhage, hydrocephalus, extra-axial collection or mass lesion/mass effect. Again noted are chronic left basal ganglia lacunar infarcts, image 16/2. There is mild diffuse low-attenuation within the subcortical and periventricular white matter compatible with chronic microvascular disease. Prominence of sulci and ventricles compatible with brain atrophy. Vascular: No hyperdense vessel or unexpected calcification. Skull: Postoperative change from right frontoparietal craniotomy. No fracture or focal lesion identified. Sinuses/Orbits: Unchanged chronic opacification of the right maxillary sinus. No acute findings. Other: None  IMPRESSION: 1. No acute intracranial abnormalities. 2. Chronic small vessel ischemic disease and brain atrophy. 3. Chronic left basal ganglia lacunar infarcts. Electronically Signed   By: Kerby Moors M.D.   On: 05/30/2021 09:48   CT CHEST WO CONTRAST  Result Date: 05/30/2021 CLINICAL DATA:  Shortness of breath.  Pneumonia. EXAM: CT CHEST WITHOUT CONTRAST TECHNIQUE: Multidetector CT imaging of the chest was performed following the standard protocol without IV contrast. RADIATION DOSE REDUCTION: This exam was performed according to the departmental dose-optimization program which includes automated exposure control, adjustment of the mA and/or kV according to patient size and/or use of iterative reconstruction technique. COMPARISON:  None. FINDINGS: Cardiovascular: The heart size is enlarged. No substantial pericardial effusion. Moderate atherosclerotic calcification is noted in the wall of the thoracic aorta. Coronary artery calcification is evident. Mediastinum/Nodes: Mild mediastinal lymphadenopathy evident. 1.3 cm short axis precarinal node identified on image 60/2. 1.2 cm short  axis subcarinal node identified on 74/2 No evidence for gross hilar lymphadenopathy although assessment is limited by the lack of intravenous contrast on the current study. The esophagus has normal imaging features. 1.6 cm right thyroid nodule evident. Lungs/Pleura: Interlobular septal thickening with patchy ground-glass airspace disease noted right upper lobe. Consolidative airspace disease with some volume loss noted in the lower lobes bilaterally, right greater than left. Moderate right with tiny left pleural effusions. Upper Abdomen: Unremarkable. Musculoskeletal: No worrisome lytic or sclerotic osseous abnormality. Old posterior right rib fractures evident. IMPRESSION: 1. Moderate right with tiny left pleural effusions. 2. Patchy ground-glass opacity in the right upper lobe is associated with bibasilar collapse/consolidation,  right greater than left. Imaging features suggest multifocal infection. Follow-up recommended to ensure resolution. 3. Mild mediastinal lymphadenopathy, likely reactive. This could also be reassessed at the time of follow-up. 4. 1.6 cm right thyroid nodule. This has been evaluated on previous imaging. (ref: J Am Coll Radiol. 2015 Feb;12(2): 143-50). 5. Aortic Atherosclerosis (ICD10-I70.0). Electronically Signed   By: Misty Stanley M.D.   On: 05/30/2021 15:01   DG Chest Port 1 View  Result Date: 05/31/2021 CLINICAL DATA:  88 year old female with hypoxia. EXAM: PORTABLE CHEST 1 VIEW COMPARISON:  Chest CT and portable radiographs yesterday and earlier. FINDINGS: Portable AP upright view at 0512 hours. Combined veiling and confluent opacity throughout the right lung compatible with multilobar infection and pleural effusion demonstrated by CT yesterday. Lower lung volumes and right lung base ventilation has worsened. Contralateral left lower lobe atelectasis versus consolidation. Stable cardiomegaly and mediastinal contours. No pneumothorax. Stable visualized osseous structures. IMPRESSION: Lower lung volumes with worsening right lung ventilation from the CT yesterday most suggestive of multilobar right lung infection with pleural effusion. Ongoing left lung base atelectasis versus infection. Electronically Signed   By: Genevie Ann M.D.   On: 05/31/2021 05:32   DG Chest Port 1 View  Result Date: 05/30/2021 CLINICAL DATA:  Questionable sepsis EXAM: PORTABLE CHEST 1 VIEW COMPARISON:  Chest x-ray 04/16/2021 FINDINGS: Heart is enlarged. Mediastinum appears stable. Calcified plaques in the aortic arch. Extensive increased ground-glass opacities in the right lung with possible small right pleural effusion. Left lung appears grossly clear. No pneumothorax identified. IMPRESSION: Extensive opacities throughout the right lung which are new since previous study and concerning for pneumonia. Likely small right pleural effusion.  Electronically Signed   By: Ofilia Neas M.D.   On: 05/30/2021 09:33   US THORACENTESIS ASP PLEURAL SPACE W/IMG GUIDE  Result Date: 05/31/2021 INDICATION: History of CHF, DM, HTN and AFib. Patient admitted for shortness of breath, found to right pleural effusion. Request for diagnostic and therapeutic right-sided thoracentesis. EXAM: ULTRASOUND GUIDED DIAGNOSTIC THERAPEUTIC RIGHT THORACENTESIS MEDICATIONS: 10 mL 1 % lidocaine COMPLICATIONS: None immediate. PROCEDURE: An ultrasound guided thoracentesis was thoroughly discussed with the patient and questions answered. The benefits, risks, alternatives and complications were also discussed. The patient understands and wishes to proceed with the procedure. Written consent was obtained. Ultrasound was performed to localize and mark an adequate pocket of fluid in the right chest. The area was then prepped and draped in the normal sterile fashion. 1% Lidocaine was used for local anesthesia. Under ultrasound guidance a 6 Fr Safe-T-Centesis catheter was introduced. Thoracentesis was performed. The catheter was removed and a dressing applied. FINDINGS: A total of approximately 650 cc of clear, amber fluid was removed. Samples were sent to the laboratory as requested by the clinical team. IMPRESSION: Successful ultrasound guided right thoracentesis yielding 650 cc of pleural  fluid. Read by: Narda Rutherford, AGNP-BC Electronically Signed   By: Markus Daft M.D.   On: 05/31/2021 15:38      ASSESSMENT/PLAN   Acute hypoxemic respiratory failure    Patient with compressive atelectasis due to moderate pleural effusion with possible CAP vs pulmonary edema.  - she is on diuretics and antibiotics -patient is on eliquis and we are holding this for now due to ordered thoracentesis   Moderate right pleural effusion    -hold eliquis for now   -thoracentesis ordered - patient is agreeable to procedure.    Chronic renal failure   - will now dc lasix and increase  lisinopril back to home dose   Chronic systolic CHF  TTE with low normal systolic function - ongoing diuresis   Patient with multiple severe active comorbid conditions including s/p CVA , amputation status, CHF, hypoxemic failure and is a nanogenerian     Thank you for allowing me to participate in the care of this patient.   Patient/Family are satisfied with care plan and all questions have been answered.  This document was prepared using Dragon voice recognition software and may include unintentional dictation errors.     Ottie Glazier, M.D.  Division of [facility]

## 2021-06-02 NOTE — Progress Notes (Signed)
OT Cancellation Note  Patient Details Name: Clista Rainford Wallen MRN: [id number] DOB: 11/17/1925   Cancelled Treatment:    Reason Eval/Treat Not Completed: Patient declined, no reason specified. Family requesting rest for pt this afternoon. Planning to take pt home tomorrow. Will re-attempt as appropriate.   Ardeth Perfect., MPH, MS, OTR/L ascom [phone] 06/02/21, 3:41 PM

## 2021-06-02 NOTE — Progress Notes (Signed)
PHARMACIST - PHYSICIAN COMMUNICATION DR:   Reesa Chew  CONCERNING: Antibiotic IV to Oral Route Change Policy  RECOMMENDATION: This patient is receiving azithromycin by the intravenous route.  Based on criteria approved by the Pharmacy and Therapeutics Committee, the antibiotic(s) is/are being converted to the equivalent oral dose form(s).   DESCRIPTION: These criteria include: Patient being treated for a respiratory tract infection, urinary tract infection, cellulitis or clostridium difficile associated diarrhea if on metronidazole The patient is not neutropenic and does not exhibit a GI malabsorption state The patient is eating (either orally or via tube) and/or has been taking other orally administered medications for a least 24 hours The patient is improving clinically and has a Tmax < 100.5

## 2021-06-02 NOTE — Progress Notes (Signed)
PT Cancellation Note  Patient Details Name: [name] MRN: [id number] DOB: 05/16/1925   Cancelled Treatment:     Pt worked with Mobility Team earlier and sat up in [place]. Now fatigued and resting in bed. Family stated they will be taking pt home tomorrow and requested pt rest for now.    OFFIE PICKRON 06/02/2021, 3:28 PM

## 2021-06-02 NOTE — Care Management Important Message (Signed)
Important Message  Patient Details  Name: [name] MRN: [id number] Date of Birth: 1925/10/02   Medicare Important Message Given:  Yes     [name] 06/02/2021, 11:37 AM

## 2021-06-02 NOTE — TOC Initial Note (Signed)
Transition of Care [facility]) - Initial/Assessment Note    Patient Details  Name: [name] MRN: [id number] Date of Birth: [birth year]  [facility]) CM/SW Contact:    [name], RN Phone Number: 06/02/2021, 3:10 PM  Clinical Narrative:                  Admitted for: Respiratory failure.  Currently on 2L acute Admitted from: home.  Son, daughter, and granddaughter alternate providing 24/7 care PCP: [place]  Current home health/prior home health/DME:  elevated [place] seat, WC, bsc, shower chair, ramp  Son and daughter declined hospital bed and lift   Patient acute with [facility] home health for RN and PT       Patient Goals and CMS Choice        Expected Discharge Plan and Services                                                Prior Living Arrangements/Services                       Activities of Daily Living Home Assistive Devices/Equipment: Wheelchair ADL Screening (condition at time of admission) Patient's cognitive ability adequate to safely complete daily activities?: No Is the patient deaf or have difficulty hearing?: Yes Does the patient have difficulty seeing, even when wearing glasses/contacts?: No Does the patient have difficulty concentrating, remembering, or making decisions?: No Patient able to express need for assistance with ADLs?: Yes Does the patient have difficulty dressing or bathing?: Yes Independently performs ADLs?: No Communication: Independent Dressing (OT): Needs assistance Is this a change from baseline?: Pre-admission baseline Grooming: Needs assistance Is this a change from baseline?: Pre-admission baseline Feeding: Independent Bathing: Needs assistance Is this a change from baseline?: Pre-admission baseline Toileting: Needs assistance Is this a change from baseline?: Pre-admission baseline In/Out Bed: Needs assistance Is this a change from baseline?: Pre-admission baseline Walks in Home: Needs  assistance Is this a change from baseline?: Pre-admission baseline Does the patient have difficulty walking or climbing stairs?: Yes Weakness of Legs: Both Weakness of Arms/Hands: None  Permission Sought/Granted                  Emotional Assessment              Admission diagnosis:  Pneumonia [J18.9] Healthcare-associated pneumonia [J18.9] NSTEMI (non-ST elevated myocardial infarction) ([place]) [I21.4] Acute respiratory failure with hypoxia ([place]) [J96.01] Sepsis, due to unspecified organism, unspecified whether acute organ dysfunction present [facility]) [A41.9] Patient Active Problem List   Diagnosis Date Noted   Acute on chronic diastolic CHF (congestive heart failure) ([place]) 05/31/2021   Stage 3a chronic kidney disease (CKD) ([place]) 05/31/2021   Pneumonia 05/30/2021   Acute respiratory failure with hypoxia ([place]) 05/30/2021   Paroxysmal atrial fibrillation ([place]) 05/30/2021   Hypertensive urgency 04/23/2021   Nocturnal hypoxemia 87/86/7672   Acute metabolic encephalopathy    Acute kidney injury superimposed on CKD [facility])    Phantom pain after amputation of lower extremity ([place])    Stroke ([place]) 04/14/2021   Insomnia 02/16/2021   Palliative care by specialist    DNR (do not resuscitate)    Advanced directive placed in chart this admission    Advanced directives, counseling/discussion    Weakness    Goals of care, counseling/discussion    Protein-calorie malnutrition,  severe 10/12/2019   Ischemia of right lower extremity 10/03/2019   Ischemia of extremity 07/31/2019   Proteinuria 04/23/2019   Atherosclerosis of native arteries of the extremities with ulceration ([place]) 03/01/2019   Chronic vertebral fracture due to osteoporosis ([place]) 05/20/2018   Closed T10 fracture ([place]) 05/20/2018   Gout 03/27/2018   Hypothyroidism 03/27/2018   Subdural hematoma 03/08/2018   Abnormal SPEP 12/07/2017   History of vitamin D deficiency 11/30/2017   Hyperlipidemia associated with type 2  diabetes mellitus ([place]) 11/30/2017   PAD (peripheral artery disease) ([place]) 07/19/2017   Leg pain, lateral, left 07/19/2017   Type 2 diabetes mellitus with stage 4 chronic kidney disease, without long-term current use of insulin ([place]) 07/19/2017   Hyperlipidemia 07/19/2017   Essential hypertension 07/19/2017   Mild aortic valve stenosis 06/22/2017   Moderate mitral insufficiency 10/30/2015   Stage 3b chronic kidney disease (CKD) ([place]) 09/19/2014   Type II or unspecified type diabetes mellitus with renal manifestations, not stated as uncontrolled(250.40) 11/08/2013   Vitamin D deficiency 11/08/2013   PCP:  Sofie Hartigan, MD Pharmacy:   [facility] [place] [address] [place] [address] Phone: [phone] Fax: [phone]     Social Determinants of Health (SDOH) Interventions    Readmission Risk Interventions No flowsheet data found.

## 2021-06-02 NOTE — Progress Notes (Signed)
°  °  Durable Medical Equipment  (From admission, onward)           Start     Ordered   06/02/21 1209  For home use only DME Hospital bed  Once       Question Answer Comment  Length of Need Lifetime   Patient has (list medical condition): dementia   The above medical condition requires: Patient requires the ability to reposition immediately   Head must be elevated greater than: 45 degrees   Bed type Semi-electric   [place] Lift Yes      06/02/21 1209

## 2021-06-03 LAB — CBC
HCT: 37.8 % (ref 36.0–46.0)
Hemoglobin: 12.3 g/dL (ref 12.0–15.0)
MCH: 30.7 pg (ref 26.0–34.0)
MCHC: 32.5 g/dL (ref 30.0–36.0)
MCV: 94.3 fL (ref 80.0–100.0)
Platelets: 232 10*3/uL (ref 150–400)
RBC: 4.01 MIL/uL (ref 3.87–5.11)
RDW: 12 % (ref 11.5–15.5)
WBC: 7.1 10*3/uL (ref 4.0–10.5)
nRBC: 0 % (ref 0.0–0.2)

## 2021-06-03 LAB — BASIC METABOLIC PANEL
Anion gap: 8 (ref 5–15)
BUN: 35 mg/dL — ABNORMAL HIGH (ref 8–23)
CO2: 30 mmol/L (ref 22–32)
Calcium: 8.9 mg/dL (ref 8.9–10.3)
Chloride: 103 mmol/L (ref 98–111)
Creatinine, Ser: 1.35 mg/dL — ABNORMAL HIGH (ref 0.44–1.00)
GFR, Estimated: 37 mL/min — ABNORMAL LOW (ref >60)
Glucose, Bld: 130 mg/dL — ABNORMAL HIGH (ref 70–99)
Potassium: 4 mmol/L (ref 3.5–5.1)
Sodium: 141 mmol/L (ref 135–145)

## 2021-06-03 LAB — GLUCOSE, CAPILLARY
Glucose-Capillary: 121 mg/dL — ABNORMAL HIGH (ref 70–99)
Glucose-Capillary: 192 mg/dL — ABNORMAL HIGH (ref 70–99)

## 2021-06-03 LAB — MAGNESIUM: Magnesium: 1.8 mg/dL (ref 1.7–2.4)

## 2021-06-03 MED ORDER — CEFDINIR 300 MG PO CAPS: 300.0000 mg | ORAL_CAPSULE | Freq: Two times a day (BID) | ORAL | 0 refills | Status: AC

## 2021-06-03 NOTE — TOC Transition Note (Signed)
Transition of Care [facility]) - CM/SW Discharge Note   Patient Details  Name: [name] MRN: [id number] Date of Birth: [birth year]  Transition of Care [facility]) CM/SW Contact:  [name], RN Phone Number: 06/03/2021, 1:47 PM   Clinical Narrative:    Patient to discharge today.  Patient weaned to RA and did not require O2 with PT.  Family declined hospital bed and lift.  Family to transport at discharge   [name] with [facility] notified of discharge          Patient Goals and CMS Choice        Discharge Placement                       Discharge Plan and Services                                     Social Determinants of Health (SDOH) Interventions     Readmission Risk Interventions No flowsheet data found.

## 2021-06-03 NOTE — Progress Notes (Signed)
Speech Language Pathology Treatment: Dysphagia  Patient Details Name: Vianna Venezia Smejkal MRN: [id number] DOB: 1925/07/11 Today's Date: 06/03/2021 Time: 7616-0737 SLP Time Calculation (min) (ACUTE ONLY): 50 min  Assessment / Plan / Recommendation Clinical Impression  Pt seen for ongoing assessment of swallowing. She was awake w/ few verbalizations but low volume. She exhibited interest in wanting to eat her breakfast meal that had arrived. She needed only min support w/ Upright positioning from SLP/staff d/t overall weakness but then fed self w/ setup of tray.    Pt explained general aspiration precautions and agreed verbally to the need for following them especially sitting upright for all oral intake -- supported behind the back for full upright sitting. She fed herself holding the Cup for thin liquids sips. No overt clinical s/s of aspiration were noted; respiratory status remained calm and unlabored, vocal quality clear b/t trials when assessed; no coughing. Oral phase appeared grossly [facility] for bolus management and timely A-P transfer for swallowing; min increased time for mastication of soft solids -- Son stated "she takes her time at home too". Suspect impact from advanced age, full Dentures. Oral clearing achieved w/ all consistencies w/ min extra time intermittently. Son denied any deficits in swallowing at recent meals yesterday as well.    Pt appears at reduced risk for aspriation when following general aspiration precautions and when given Support w/ sitting Upright and tray setup at meals d/t overall weakness.   OF NOTE: Pt has described and endorsed potential s/s of REFLUX including the Globus feelings when eating solids at a previous evaluation in 2021. Any Reflux behavior and/or Esophageal dysmotility can impact comfort of swallowing and the pharyngeal phase of swallowing. Discussed w/ Son and pt general Aging impact on Esophageal motility and oral intake in general then; Reflux precautions; use  of cut, moist foods for ease of clearing -- such diet consistency also offers support of conservation of energy.     Recommend a more mech soft diet w/ Chopped meats, moistened foods w/ thin liquids VIA CUP; general Reflux and general aspiration precautions. Recommend Upright positioning and less talking/distraction during meals to allow pt to focus on oral intake. Recommend support at meals w/ as needed; tray prep. Recommend REFLUX precautions. Recommend Pills CRUSHED vs Whole in Puree for easier, safer swallowing. These precautions were discussed w/ pt/Son/NSG; posted in chart for NSG. Handouts given on the mech soft diet consistency; general precautions. No further skilled ST services indicated at this time. NSG to reconsult if any new needs while admitted. NSG updated.      Recommend continue a mech soft diet for ease of soft foods w/ gravies added to moisten foods; Thin liquids VIA CUP -- no straws. Recommend general aspiration precautions; Pills Whole in Puree for ease and safety of swallowing; tray setup and positioning assistance for meals. Feeding assistance to support. No further acute, skilled ST services indicated; regarding any speech needs to be addressed, recommend ST f/u post discharge when pt's weakness and pain have improved for better engagement in services. ST services will sign off at this time w/ MD to reconsult if needed while admitted. NSG updated. Precautions posted at bedside.         HPI HPI: Per H&P "Samanth Mirkin Vierra is a 88 y.o. female with medical history significant for recent CVA with right-sided hemiparesis status post tPA administration, history of peripheral arterial disease status post right AKA, diabetes mellitus with complications of stage IV chronic kidney disease, hypertension and dyslipidemia who was  recently discharged from rehab following her hospitalization for acute stroke.  She was brought into the emergency room by private vehicle from home for evaluation of  worsening shortness of breath.  Her son states that symptoms started 4 days prior to her presentation and she was seen by her primary care provider who recommended she resume her diuretic therapy and follow-up in 1 week.  Her children state that she has remained short of breath and now has a cough which is nonproductive.  He also states that she is very weak when compared to her baseline.  She denies having any fever or chills.  Upon arrival to the ER she was noted to be hypoxic with room air pulse oximetry of 85% and was placed on 3 L of oxygen.  She also had diffuse wheezes.  Family states that she has not had any difficulty swallowing and that she is on a regular diet at home.  Following her discharge speech had recommended mechanical soft diet with thin liquids which patient received while at rehab but since she has been home she has been on a regular diet.  Daughter notes difficulty swallowing large pills but otherwise states that she is able to tolerate her diet.  They also note that since the stroke she has word finding difficulties.   Unable to do a review of systems on this patient"      SLP Plan  All goals met      Recommendations for follow up therapy are one component of a multi-disciplinary discharge planning process, led by the attending physician.  Recommendations may be updated based on patient status, additional functional criteria and insurance authorization.    Recommendations  Diet recommendations: Dysphagia 3 (mechanical soft);Thin liquid (for conservation of energy) Liquids provided via: Cup;No straw Medication Administration: Whole meds with puree (vs Crushed in puree for ease of intake) Supervision: Patient able to self feed (setup support) Compensations: Minimize environmental distractions;Slow rate;Small sips/bites;Lingual sweep for clearance of pocketing;Follow solids with liquid Postural Changes and/or Swallow Maneuvers: Out of bed for meals;Seated upright 90  degrees;Upright 30-60 min after meal                General recommendations:  (Dietician f/u as needed) Oral Care Recommendations: Oral care BID;Oral care before and after PO;Staff/trained caregiver to provide oral care (Denture care) Follow Up Recommendations: No SLP follow up Assistance recommended at discharge: Set up Supervision/Assistance SLP Visit Diagnosis: Dysphagia, unspecified (R13.10) (advance age; dentures) Plan: All goals met            Orinda Kenner, [place], [place]; [place] [phone] (ascom) Jodey Burbano  06/03/2021, 3:39 PM

## 2021-06-03 NOTE — Discharge Summary (Signed)
Physician Discharge Summary  [name] SWF:[id number] DOB: June 27, 1925 DOA: 05/30/2021  PCP: Kathleen Hartigan, MD  Admit date: 05/30/2021 Discharge date: 06/03/2021  Admitted From: Home Disposition:  Home  Discharge Condition:Stable CODE STATUS:DNR Diet recommendation: Dysphagia 3 diet   Brief/Interim Summary:  88 year old with history of recent CVA status post tPA, PAD status post right-sided AKA, DM 2, CKD stage IIIa, HTN, HLD admitted to the hospital for shortness of breath.  She was found to have acute respiratory distress likely from fluid overload, right-sided pleural effusion with possibly TOF multifocal pneumonia.  She was started on IV diuretics, pulmonary was consulted for thoracentesis.  She was empirically started on IV Rocephin and azithromycin as well.  Pulmonary was consulted and she underwent thoracentesis on 2/19.  650 cc was removed.  Study showed transudative fluid, no organisms were seen on micro.  She has been weaned down to room air.  PT/OT recommended home health.  Medically stable for discharge to home today.  Following problems were addressed during her hospitalization:  Acute respiratory failure with hypoxia ([place])- (present on admission) CT of the chest showed multifocal infection, moderate right-sided pleural effusion, mediastinal lymphadenopathy and previously known 1.6 cm right-sided thyroid nodule Started on IV Rocephin and azithromycin Echocardiogram January 2022 showed EF 55% Seen by pulmonary.  Status post thoracentesis, 650 cc removed.  Transudative fluid.  No organisms seen on blood cultures. Weaned down to room air today.  Antibiotics changed to oral.  Respiratory status is stable   Stage 3a chronic kidney disease (CKD) (HCC) Creatinine is around baseline of 1.6.  Kidney function better than baseline now.   Acute on chronic diastolic CHF (congestive heart failure) (HCC) Echocardiogram in January 2020 showed EF of 55%.  Elevated BNP, status post  thoracentesis 2/19, 650 cc removed. Will not continue diuretics on discharge   Pneumonia- (present on admission) Multifocal pneumonia seen on the CT. plan as above   Paroxysmal atrial fibrillation ([place])- (present on admission) Chronic.  Rate controlled, not on any rate controlling medication Continue Eliquis   Phantom pain after amputation of lower extremity ([place])- (present on admission) Secondary to history of AKA   DNR (do not resuscitate)- (present on admission) CODE STATUS confirmed-DNR   Mild aortic valve stenosis Seen on previous echocardiogram.  Currently chest pain-free.  Not symptomatic   Hypothyroidism- (present on admission) On Synthroid   Essential hypertension Continue lisinopril 5 mg daily  Hyperlipidemia- (present on admission) Daily Lipitor   Type 2 diabetes mellitus with stage 4 chronic kidney disease, without long-term current use of insulin ([place])- (present on admission) Continue home regimen.  A1c of 7.7 as per Jan 2023   PAD (peripheral artery disease) ([place])- (present on admission) On Eliquis and statin.  Has history of AKA           Discharge Diagnoses:  Principal Problem:   Acute respiratory failure with hypoxia ([place]) Active Problems:   PAD (peripheral artery disease) (HCC)   Type 2 diabetes mellitus with stage 4 chronic kidney disease, without long-term current use of insulin (HCC)   Hyperlipidemia   Essential hypertension   Hypothyroidism   Mild aortic valve stenosis   DNR (do not resuscitate)   Phantom pain after amputation of lower extremity ([place])   Pneumonia   Paroxysmal atrial fibrillation (HCC)   Acute on chronic diastolic CHF (congestive heart failure) ([place])   Stage 3a chronic kidney disease (CKD) [facility])    Discharge Instructions  Discharge Instructions     Diet general  Complete by: As directed    Dysphagia 3 diet   Discharge instructions   Complete by: As directed    1)Please take your medications as  instructed 2)Follow up with your PCP in a week   Increase activity slowly   Complete by: As directed       Allergies as of 06/03/2021       Reactions   Sulfa Antibiotics Hives        Medication List     STOP taking these medications    amLODipine 10 MG tablet Commonly known as: NORVASC   aspirin 81 MG EC tablet   carvedilol 3.125 MG tablet Commonly known as: COREG   furosemide 20 MG tablet Commonly known as: LASIX   Vitamin D3 25 MCG tablet Commonly known as: Vitamin D       TAKE these medications    Accu-Chek Aviva Plus test strip Generic drug: glucose blood   acetaminophen 325 MG tablet Commonly known as: TYLENOL Take 325-650 mg by mouth every 6 (six) hours as needed for headache.   atorvastatin 40 MG tablet Commonly known as: LIPITOR Take 1 tablet (40 mg total) by mouth daily.   calcium carbonate 1500 (600 Ca) MG Tabs tablet Commonly known as: OSCAL Take by mouth daily with breakfast.   Calcium Carbonate-Vitamin D 600-5 MG-MCG Tabs Take 1 tablet by mouth daily.   cefdinir 300 MG capsule Commonly known as: OMNICEF Take 1 capsule (300 mg total) by mouth 2 (two) times daily for 1 day. Start taking on: June 04, 2021   Eliquis 2.5 MG Tabs tablet Generic drug: apixaban Take 2.5 mg by mouth 2 (two) times daily.   HumaLOG KwikPen 100 UNIT/ML KwikPen Generic drug: insulin lispro Inject 3 Units into the skin 3 (three) times daily after meals. Hold if eats <50% of meal   insulin lispro 100 UNIT/ML KwikPen Commonly known as: HUMALOG Inject into the skin.   levothyroxine 88 MCG tablet Commonly known as: SYNTHROID Take 88 mcg by mouth daily before breakfast.   lisinopril 5 MG tablet Commonly known as: ZESTRIL Take 5 mg by mouth daily.   mirtazapine 15 MG tablet Commonly known as: REMERON Take 1 tablet by mouth at bedtime. What changed: Another medication with the same name was removed. Continue taking this medication, and follow the  directions you see here.   nystatin powder Commonly known as: MYCOSTATIN/NYSTOP Apply topically.   PRESERVISION AREDS 2 PO Take 1 tablet by mouth every evening.               Durable Medical Equipment  (From admission, onward)           Start     Ordered   06/02/21 1209  For home use only DME Hospital bed  Once       Question Answer Comment  Length of Need Lifetime   Patient has (list medical condition): dementia   The above medical condition requires: Patient requires the ability to reposition immediately   Head must be elevated greater than: 45 degrees   Bed type Semi-electric   Hoyer Lift Yes      06/02/21 1209            Follow-up Information     Feldpausch, Chrissie Noa, MD. Schedule an appointment as soon as possible for a visit in 1 week(s).   Specialty: Family Medicine Contact information: [address] [place] [phone]  Allergies  Allergen Reactions   Sulfa Antibiotics Hives    Consultations: Pulmonology   Procedures/Studies: DG Chest 1 View  Result Date: 05/31/2021 CLINICAL DATA:  Status post RIGHT thoracentesis. EXAM: CHEST  1 VIEW COMPARISON:  05/31/2021 and prior studies FINDINGS: Cardiomegaly again noted. Bilateral airspace opacities are slightly improved. RIGHT pleural effusion appears somewhat decreased. Bilateral LOWER lung consolidation/atelectasis again noted. There is no evidence of pneumothorax. IMPRESSION: Slightly improved bilateral airspace opacities and likely decreased RIGHT pleural effusion. No pneumothorax. Electronically Signed   By: Margarette Canada M.D.   On: 05/31/2021 14:18   CT HEAD WO CONTRAST (5MM)  Result Date: 05/30/2021 CLINICAL DATA:  Evaluate for subarachnoid hemorrhage. Complains of weakness. EXAM: CT HEAD WITHOUT CONTRAST TECHNIQUE: Contiguous axial images were obtained from the base of the skull through the vertex without intravenous contrast. RADIATION DOSE REDUCTION: This exam  was performed according to the departmental dose-optimization program which includes automated exposure control, adjustment of the mA and/or kV according to patient size and/or use of iterative reconstruction technique. COMPARISON:  04/17/2021 FINDINGS: Brain: No evidence of acute infarction, hemorrhage, hydrocephalus, extra-axial collection or mass lesion/mass effect. Again noted are chronic left basal ganglia lacunar infarcts, image 16/2. There is mild diffuse low-attenuation within the subcortical and periventricular white matter compatible with chronic microvascular disease. Prominence of sulci and ventricles compatible with brain atrophy. Vascular: No hyperdense vessel or unexpected calcification. Skull: Postoperative change from right frontoparietal craniotomy. No fracture or focal lesion identified. Sinuses/Orbits: Unchanged chronic opacification of the right maxillary sinus. No acute findings. Other: None IMPRESSION: 1. No acute intracranial abnormalities. 2. Chronic small vessel ischemic disease and brain atrophy. 3. Chronic left basal ganglia lacunar infarcts. Electronically Signed   By: Kerby Moors M.D.   On: 05/30/2021 09:48   CT CHEST WO CONTRAST  Result Date: 05/30/2021 CLINICAL DATA:  Shortness of breath.  Pneumonia. EXAM: CT CHEST WITHOUT CONTRAST TECHNIQUE: Multidetector CT imaging of the chest was performed following the standard protocol without IV contrast. RADIATION DOSE REDUCTION: This exam was performed according to the departmental dose-optimization program which includes automated exposure control, adjustment of the mA and/or kV according to patient size and/or use of iterative reconstruction technique. COMPARISON:  None. FINDINGS: Cardiovascular: The heart size is enlarged. No substantial pericardial effusion. Moderate atherosclerotic calcification is noted in the wall of the thoracic aorta. Coronary artery calcification is evident. Mediastinum/Nodes: Mild mediastinal lymphadenopathy  evident. 1.3 cm short axis precarinal node identified on image 60/2. 1.2 cm short axis subcarinal node identified on 74/2 No evidence for gross hilar lymphadenopathy although assessment is limited by the lack of intravenous contrast on the current study. The esophagus has normal imaging features. 1.6 cm right thyroid nodule evident. Lungs/Pleura: Interlobular septal thickening with patchy ground-glass airspace disease noted right upper lobe. Consolidative airspace disease with some volume loss noted in the lower lobes bilaterally, right greater than left. Moderate right with tiny left pleural effusions. Upper Abdomen: Unremarkable. Musculoskeletal: No worrisome lytic or sclerotic osseous abnormality. Old posterior right rib fractures evident. IMPRESSION: 1. Moderate right with tiny left pleural effusions. 2. Patchy ground-glass opacity in the right upper lobe is associated with bibasilar collapse/consolidation, right greater than left. Imaging features suggest multifocal infection. Follow-up recommended to ensure resolution. 3. Mild mediastinal lymphadenopathy, likely reactive. This could also be reassessed at the time of follow-up. 4. 1.6 cm right thyroid nodule. This has been evaluated on previous imaging. (ref: J Am Coll Radiol. 2015 Feb;12(2): 143-50). 5. Aortic Atherosclerosis (ICD10-I70.0). Electronically Signed  By: Misty Stanley M.D.   On: 05/30/2021 15:01   DG Chest Port 1 View  Result Date: 06/02/2021 CLINICAL DATA:  Shortness of breath, hypoxia EXAM: PORTABLE CHEST 1 VIEW COMPARISON:  Previous studies including the examination done on 05/31/2021 FINDINGS: Transverse diameter of heart is increased. There is interval improvement in aeration in the right parahilar region. There is prominence of central pulmonary vessels. There is residual prominence of interstitial markings in both parahilar regions and lower lung fields. There is blunting of lateral CP angles. There is no pneumothorax. IMPRESSION:  Cardiomegaly. There is improvement in aeration of right parahilar region suggesting resolving pneumonia or resolving asymmetric pulmonary edema. There is residual prominence of interstitial markings in both parahilar regions and lower lung fields suggesting residual interstitial edema or interstitial pneumonia. Small bilateral pleural effusions are seen. Electronically Signed   By: Elmer Picker M.D.   On: 06/02/2021 16:32   DG Chest Port 1 View  Result Date: 05/31/2021 CLINICAL DATA:  88 year old female with hypoxia. EXAM: PORTABLE CHEST 1 VIEW COMPARISON:  Chest CT and portable radiographs yesterday and earlier. FINDINGS: Portable AP upright view at 0512 hours. Combined veiling and confluent opacity throughout the right lung compatible with multilobar infection and pleural effusion demonstrated by CT yesterday. Lower lung volumes and right lung base ventilation has worsened. Contralateral left lower lobe atelectasis versus consolidation. Stable cardiomegaly and mediastinal contours. No pneumothorax. Stable visualized osseous structures. IMPRESSION: Lower lung volumes with worsening right lung ventilation from the CT yesterday most suggestive of multilobar right lung infection with pleural effusion. Ongoing left lung base atelectasis versus infection. Electronically Signed   By: Genevie Ann M.D.   On: 05/31/2021 05:32   DG Chest Port 1 View  Result Date: 05/30/2021 CLINICAL DATA:  Questionable sepsis EXAM: PORTABLE CHEST 1 VIEW COMPARISON:  Chest x-ray 04/16/2021 FINDINGS: Heart is enlarged. Mediastinum appears stable. Calcified plaques in the aortic arch. Extensive increased ground-glass opacities in the right lung with possible small right pleural effusion. Left lung appears grossly clear. No pneumothorax identified. IMPRESSION: Extensive opacities throughout the right lung which are new since previous study and concerning for pneumonia. Likely small right pleural effusion. Electronically Signed   By:  Ofilia Neas M.D.   On: 05/30/2021 09:33   US THORACENTESIS ASP PLEURAL SPACE W/IMG GUIDE  Result Date: 05/31/2021 INDICATION: History of CHF, DM, HTN and AFib. Patient admitted for shortness of breath, found to right pleural effusion. Request for diagnostic and therapeutic right-sided thoracentesis. EXAM: ULTRASOUND GUIDED DIAGNOSTIC THERAPEUTIC RIGHT THORACENTESIS MEDICATIONS: 10 mL 1 % lidocaine COMPLICATIONS: None immediate. PROCEDURE: An ultrasound guided thoracentesis was thoroughly discussed with the patient and questions answered. The benefits, risks, alternatives and complications were also discussed. The patient understands and wishes to proceed with the procedure. Written consent was obtained. Ultrasound was performed to localize and mark an adequate pocket of fluid in the right chest. The area was then prepped and draped in the normal sterile fashion. 1% Lidocaine was used for local anesthesia. Under ultrasound guidance a 6 Fr Safe-T-Centesis catheter was introduced. Thoracentesis was performed. The catheter was removed and a dressing applied. FINDINGS: A total of approximately 650 cc of clear, amber fluid was removed. Samples were sent to the laboratory as requested by the clinical team. IMPRESSION: Successful ultrasound guided right thoracentesis yielding 650 cc of pleural fluid. Read by: Narda Rutherford, AGNP-BC Electronically Signed   By: Markus Daft M.D.   On: 05/31/2021 15:38      Subjective: Patient seen and  examined at the bedside this morning. On room air. Hemodynamically stable.  Sitting on the chair.  Daughter at the bedside.  Medically stable for discharge  Discharge Exam: Vitals:   06/03/21 0424 06/03/21 0856  BP: (!) 152/66 (!) 169/70  Pulse: 69 69  Resp: 18 18  Temp: 98.9 F (37.2 C) 98.6 F (37 C)  SpO2: 96% 94%   Vitals:   06/02/21 1925 06/02/21 2107 06/03/21 0424 06/03/21 0856  BP: (!) 145/63  (!) 152/66 (!) 169/70  Pulse: 76  69 69  Resp: 18  18 18   Temp: 99  F (37.2 C)  98.9 F (37.2 C) 98.6 F (37 C)  TempSrc:   Oral Oral  SpO2: 96% 94% 96% 94%  Weight:      Height:        General: Pt is alert, awake, not in acute distress, pleasant elderly female Cardiovascular: RRR, S1/S2 +, no rubs, no gallops Respiratory: CTA bilaterally, no wheezing, no rhonchi Abdominal: Soft, NT, ND, bowel sounds + Extremities: no edema, no cyanosis, right AKA    The results of significant diagnostics from this hospitalization (including imaging, microbiology, ancillary and laboratory) are listed below for reference.     Microbiology: Recent Results (from the past 240 hour(s))  Resp Panel by RT-PCR (Flu A&B, Covid) Nasopharyngeal Swab     Status: None   Collection Time: 05/30/21  9:27 AM   Specimen: Nasopharyngeal Swab; Nasopharyngeal(NP) swabs in vial transport medium  Result Value Ref Range Status   SARS Coronavirus 2 by RT PCR NEGATIVE NEGATIVE Final    Comment: (NOTE) SARS-CoV-2 target nucleic acids are NOT DETECTED.  The SARS-CoV-2 RNA is generally detectable in upper respiratory specimens during the acute phase of infection. The lowest concentration of SARS-CoV-2 viral copies this assay can detect is 138 copies/mL. A negative result does not preclude SARS-Cov-2 infection and should not be used as the sole basis for treatment or other patient management decisions. A negative result may occur with  improper specimen collection/handling, submission of specimen other than nasopharyngeal swab, presence of viral mutation(s) within the areas targeted by this assay, and inadequate number of viral copies(<138 copies/mL). A negative result must be combined with clinical observations, patient history, and epidemiological information. The expected result is Negative.  Fact Sheet for Patients:  EntrepreneurPulse.com.au  Fact Sheet for Healthcare Providers:  IncredibleEmployment.be  This test is no t yet approved or  cleared by the [place] FDA and  has been authorized for detection and/or diagnosis of SARS-CoV-2 by FDA under an Emergency Use Authorization (EUA). This EUA will remain  in effect (meaning this test can be used) for the duration of the COVID-19 declaration under Section 564(b)(1) of the Act, 21 U.S.C.section 360bbb-3(b)(1), unless the authorization is terminated  or revoked sooner.       Influenza A by PCR NEGATIVE NEGATIVE Final   Influenza B by PCR NEGATIVE NEGATIVE Final    Comment: (NOTE) The Xpert Xpress SARS-CoV-2/FLU/RSV plus assay is intended as an aid in the diagnosis of influenza from Nasopharyngeal swab specimens and should not be used as a sole basis for treatment. Nasal washings and aspirates are unacceptable for Xpert Xpress SARS-CoV-2/FLU/RSV testing.  Fact Sheet for Patients: EntrepreneurPulse.com.au  Fact Sheet for Healthcare Providers: IncredibleEmployment.be  This test is not yet approved or cleared by the [place] FDA and has been authorized for detection and/or diagnosis of SARS-CoV-2 by FDA under an Emergency Use Authorization (EUA). This EUA will remain in effect (meaning this test  can be used) for the duration of the COVID-19 declaration under Section 564(b)(1) of the Act, 21 U.S.C. section 360bbb-3(b)(1), unless the authorization is terminated or revoked.  Performed at [facility], [address] [place], [place] [address]   Blood Culture (routine x 2)     Status: None (Preliminary result)   Collection Time: 05/30/21  9:27 AM   Specimen: BLOOD  Result Value Ref Range Status   Specimen Description BLOOD LEFT ANTECUBITAL  Final   Special Requests   Final    BOTTLES DRAWN AEROBIC AND ANAEROBIC Blood Culture adequate volume   Culture   Final    NO GROWTH 4 DAYS Performed at [facility], [address], [place], [place] [address]    Report Status PENDING  Incomplete  Blood Culture  (routine x 2)     Status: None (Preliminary result)   Collection Time: 05/30/21  9:27 AM   Specimen: BLOOD  Result Value Ref Range Status   Specimen Description BLOOD RIGHT ANTECUBITAL  Final   Special Requests   Final    BOTTLES DRAWN AEROBIC AND ANAEROBIC Blood Culture adequate volume   Culture   Final    NO GROWTH 4 DAYS Performed at [facility], [address] [place], [place] [address]    Report Status PENDING  Incomplete  MRSA Next Gen by PCR, Nasal     Status: None   Collection Time: 05/31/21 11:04 AM   Specimen: Nasal Mucosa; Nasal Swab  Result Value Ref Range Status   MRSA by PCR Next Gen NOT DETECTED NOT DETECTED Final    Comment: (NOTE) The GeneXpert MRSA Assay (FDA approved for NASAL specimens only), is one component of a comprehensive MRSA colonization surveillance program. It is not intended to diagnose MRSA infection nor to guide or monitor treatment for MRSA infections. Test performance is not FDA approved in patients less than 34 years old. Performed at [facility], [address] [place], [place] [address]   Body fluid culture w Gram Stain     Status: None (Preliminary result)   Collection Time: 05/31/21  2:13 PM   Specimen: PATH Cytology Pleural fluid  Result Value Ref Range Status   Specimen Description   Final    PLEURAL FLUID Performed at [facility], [address] [place], [place] [address]    Special Requests   Final    NONE Performed at [facility], [address] [place], [place] [address]    Gram Stain   Final    WBC PRESENT,BOTH PMN AND MONONUCLEAR NO ORGANISMS SEEN CYTOSPIN SMEAR    Culture   Final    NO GROWTH 3 DAYS Performed at [facility], [place] [address], [place],  [address]    Report Status PENDING  Incomplete     Labs: BNP (last 3 results) Recent Labs    05/30/21 0904 05/31/21 0534  BNP 563.9* 782.4*   Basic Metabolic Panel: Recent Labs  Lab 05/30/21 0904  05/31/21 0534 06/01/21 0530 06/02/21 0404 06/03/21 0416  NA 142 146* 143 144 141  K 4.3 4.4 3.6 3.9 4.0  CL 109 112* 110 107 103  CO2 24 24 26 28 30   GLUCOSE 230* 143* 134* 132* 130*  BUN 27* 31* 36* 38* 35*  CREATININE 1.78* 1.69* 1.68* 1.60* 1.35*  CALCIUM 9.1 8.9 8.7* 8.6* 8.9  MG  --   --  1.9 1.9 1.8   Liver Function Tests: Recent Labs  Lab 05/30/21  0904  AST 45*  ALT 36  ALKPHOS 105  BILITOT 1.2  PROT 8.1  ALBUMIN 3.4*   No results for input(s): LIPASE, AMYLASE in the last 168 hours. No results for input(s): AMMONIA in the last 168 hours. CBC: Recent Labs  Lab 05/30/21 0904 05/31/21 0534 06/01/21 0530 06/02/21 0404 06/03/21 0416  WBC 12.7* 10.6* 10.1 7.2 7.1  NEUTROABS 9.8*  --   --   --   --   HGB 13.3 11.9* 11.8* 11.8* 12.3  HCT 41.7 37.8 35.6* 36.3 37.8  MCV 95.4 96.9 93.2 94.3 94.3  PLT 251 225 235 218 232   Cardiac Enzymes: No results for input(s): CKTOTAL, CKMB, CKMBINDEX, TROPONINI in the last 168 hours. BNP: Invalid input(s): POCBNP CBG: Recent Labs  Lab 06/02/21 1147 06/02/21 1641 06/02/21 2102 06/03/21 0859 06/03/21 1204  GLUCAP 126* 162* 205* 121* 192*   D-Dimer No results for input(s): DDIMER in the last 72 hours. Hgb A1c No results for input(s): HGBA1C in the last 72 hours. Lipid Profile No results for input(s): CHOL, HDL, LDLCALC, TRIG, CHOLHDL, LDLDIRECT in the last 72 hours. Thyroid function studies No results for input(s): TSH, T4TOTAL, T3FREE, THYROIDAB in the last 72 hours.  Invalid input(s): FREET3 Anemia work up No results for input(s): VITAMINB12, FOLATE, FERRITIN, TIBC, IRON, RETICCTPCT in the last 72 hours. Urinalysis    Component Value Date/Time   COLORURINE YELLOW (A) 05/30/2021 0927   APPEARANCEUR CLOUDY (A) 05/30/2021 0927   LABSPEC 1.017 05/30/2021 0927   PHURINE 5.0 05/30/2021 0927   GLUCOSEU 150 (A) 05/30/2021 0927   HGBUR NEGATIVE 05/30/2021 0927   BILIRUBINUR NEGATIVE 05/30/2021 0927   KETONESUR  NEGATIVE 05/30/2021 0927   PROTEINUR >=300 (A) 05/30/2021 0927   NITRITE NEGATIVE 05/30/2021 0927   LEUKOCYTESUR NEGATIVE 05/30/2021 0927   Sepsis Labs Invalid input(s): PROCALCITONIN,  WBC,  LACTICIDVEN Microbiology Recent Results (from the past 240 hour(s))  Resp Panel by RT-PCR (Flu A&B, Covid) Nasopharyngeal Swab     Status: None   Collection Time: 05/30/21  9:27 AM   Specimen: Nasopharyngeal Swab; Nasopharyngeal(NP) swabs in vial transport medium  Result Value Ref Range Status   SARS Coronavirus 2 by RT PCR NEGATIVE NEGATIVE Final    Comment: (NOTE) SARS-CoV-2 target nucleic acids are NOT DETECTED.  The SARS-CoV-2 RNA is generally detectable in upper respiratory specimens during the acute phase of infection. The lowest concentration of SARS-CoV-2 viral copies this assay can detect is 138 copies/mL. A negative result does not preclude SARS-Cov-2 infection and should not be used as the sole basis for treatment or other patient management decisions. A negative result may occur with  improper specimen collection/handling, submission of specimen other than nasopharyngeal swab, presence of viral mutation(s) within the areas targeted by this assay, and inadequate number of viral copies(<138 copies/mL). A negative result must be combined with clinical observations, patient history, and epidemiological information. The expected result is Negative.  Fact Sheet for Patients:  EntrepreneurPulse.com.au  Fact Sheet for Healthcare Providers:  IncredibleEmployment.be  This test is no t yet approved or cleared by the [place] FDA and  has been authorized for detection and/or diagnosis of SARS-CoV-2 by FDA under an Emergency Use Authorization (EUA). This EUA will remain  in effect (meaning this test can be used) for the duration of the COVID-19 declaration under Section 564(b)(1) of the Act, 21 U.S.C.section 360bbb-3(b)(1), unless the authorization  is terminated  or revoked sooner.       Influenza A by PCR NEGATIVE NEGATIVE  Final   Influenza B by PCR NEGATIVE NEGATIVE Final    Comment: (NOTE) The Xpert Xpress SARS-CoV-2/FLU/RSV plus assay is intended as an aid in the diagnosis of influenza from Nasopharyngeal swab specimens and should not be used as a sole basis for treatment. Nasal washings and aspirates are unacceptable for Xpert Xpress SARS-CoV-2/FLU/RSV testing.  Fact Sheet for Patients: EntrepreneurPulse.com.au  Fact Sheet for Healthcare Providers: IncredibleEmployment.be  This test is not yet approved or cleared by the [place] FDA and has been authorized for detection and/or diagnosis of SARS-CoV-2 by FDA under an Emergency Use Authorization (EUA). This EUA will remain in effect (meaning this test can be used) for the duration of the COVID-19 declaration under Section 564(b)(1) of the Act, 21 U.S.C. section 360bbb-3(b)(1), unless the authorization is terminated or revoked.  Performed at [facility], [address] [place], [place] [address]   Blood Culture (routine x 2)     Status: None (Preliminary result)   Collection Time: 05/30/21  9:27 AM   Specimen: BLOOD  Result Value Ref Range Status   Specimen Description BLOOD LEFT ANTECUBITAL  Final   Special Requests   Final    BOTTLES DRAWN AEROBIC AND ANAEROBIC Blood Culture adequate volume   Culture   Final    NO GROWTH 4 DAYS Performed at [facility], [address], [place], [place] [address]    Report Status PENDING  Incomplete  Blood Culture (routine x 2)     Status: None (Preliminary result)   Collection Time: 05/30/21  9:27 AM   Specimen: BLOOD  Result Value Ref Range Status   Specimen Description BLOOD RIGHT ANTECUBITAL  Final   Special Requests   Final    BOTTLES DRAWN AEROBIC AND ANAEROBIC Blood Culture adequate volume   Culture   Final    NO GROWTH 4 DAYS Performed at [facility], [address], [place], [place] [address]    Report Status PENDING  Incomplete  MRSA Next Gen by PCR, Nasal     Status: None   Collection Time: 05/31/21 11:04 AM   Specimen: Nasal Mucosa; Nasal Swab  Result Value Ref Range Status   MRSA by PCR Next Gen NOT DETECTED NOT DETECTED Final    Comment: (NOTE) The GeneXpert MRSA Assay (FDA approved for NASAL specimens only), is one component of a comprehensive MRSA colonization surveillance program. It is not intended to diagnose MRSA infection nor to guide or monitor treatment for MRSA infections. Test performance is not FDA approved in patients less than 63 years old. Performed at [facility], [address] [place], [place] [address]   Body fluid culture w Gram Stain     Status: None (Preliminary result)   Collection Time: 05/31/21  2:13 PM   Specimen: PATH Cytology Pleural fluid  Result Value Ref Range Status   Specimen Description   Final    PLEURAL FLUID Performed at [facility], [address], [place], [place] [address]    Special Requests   Final    NONE Performed at [facility], [address] [place], [place] [address]    Gram Stain   Final    WBC PRESENT,BOTH PMN AND MONONUCLEAR NO ORGANISMS SEEN CYTOSPIN SMEAR    Culture   Final    NO GROWTH 3 DAYS Performed at [facility], [place] [address], [place], [place] [address]    Report Status PENDING  Incomplete    Please note: You were cared  for by a hospitalist during your hospital stay. Once you are discharged, your primary care physician will handle any further medical issues. Please note that NO REFILLS for any discharge medications will be authorized once you are discharged, as it is imperative that you return to your primary care physician (or establish a relationship with a primary care physician if you do not have one) for your post hospital discharge needs so that they can reassess your need for medications and  monitor your lab values.    Time coordinating discharge: 40 minutes  SIGNED:   Shelly Coss, MD  [facility] 06/03/2021, 1:43 PM Pager [phone]  If 7PM-7AM, please contact night-coverage [email or web address] Password [email or web address]

## 2021-06-03 NOTE — Progress Notes (Signed)
PULMONOLOGY         Date: 06/03/2021,   MRN# [id number] [name] 1925-11-24     AdmissionWeight: 57.8 kg                 CurrentWeight: 57.8 kg   Referring physician: Dr Reesa Chew   CHIEF COMPLAINT:   Acute hypoxemic respiratory failure   HISTORY OF PRESENT ILLNESS   88 yo F with Hx of CHF DM HTN AF on anticoagulation chronically.  Patient is unwell and sleeping during my evaluation but daughter is at bedside shares that patient is DNR and DNI but without dementia.    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   I reviewed Chest imaging independently and with family at bedside.  We looked at images together with findings of right upper lobe infiltrate, right moderate pleural effusion and diffuse bronchitic changes.   She is receiving rocephin and zithromax.   06/02/21- patient on 2L/min [place].  She does have atelectasis and would benefit from IS/flutter valve.  She is weak but should be OOB to chair orders placed. She is not edematous on leg s/p amputation on right.   06/03/21- patient is improved to room air and is participating in PT.  She is substantially improved.   PAST MEDICAL HISTORY   Past Medical History:  Diagnosis Date   A-fib [facility])    CHF (congestive heart failure) ([place])    Diabetes mellitus without complication ([place])    Hypertension      SURGICAL HISTORY   Past Surgical History:  Procedure Laterality Date   AMPUTATION Right 10/10/2019   Procedure: AMPUTATION ABOVE KNEE;  Surgeon: Katha Cabal, MD;  Location: [facility] ORS;  Service: Vascular;  Laterality: Right;   CHOLECYSTECTOMY     LOWER EXTREMITY ANGIOGRAPHY Right 08/12/2017   Procedure: LOWER EXTREMITY ANGIOGRAPHY;  Surgeon: Katha Cabal, MD;  Location: [place] [facility] LAB;  Service: Cardiovascular;  Laterality: Right;   LOWER EXTREMITY ANGIOGRAPHY Right 06/13/2018   Procedure: LOWER EXTREMITY ANGIOGRAPHY;   Surgeon: Katha Cabal, MD;  Location: [place] CV LAB;  Service: Cardiovascular;  Laterality: Right;   LOWER EXTREMITY ANGIOGRAPHY Right 03/01/2019   Procedure: LOWER EXTREMITY ANGIOGRAPHY;  Surgeon: Katha Cabal, MD;  Location: [place] CV LAB;  Service: Cardiovascular;  Laterality: Right;   LOWER EXTREMITY ANGIOGRAPHY Right 07/31/2019   Procedure: LOWER EXTREMITY ANGIOGRAPHY;  Surgeon: Katha Cabal, MD;  Location: [place] CV LAB;  Service: Cardiovascular;  Laterality: Right;   LOWER EXTREMITY ANGIOGRAPHY Right 10/04/2019   Procedure: Lower Extremity Angiography;  Surgeon: Katha Cabal, MD;  Location: [place] CV LAB;  Service: Cardiovascular;  Laterality: Right;     FAMILY HISTORY   Family History  Problem Relation Age of Onset   Cancer Brother      SOCIAL HISTORY   Social History   Tobacco Use   Smoking status: Never   Smokeless tobacco: Never  Vaping Use   Vaping Use: Never used  Substance Use Topics   Alcohol use: No   Drug use: No     MEDICATIONS    Home Medication:    Current Medication:  Current Facility-Administered Medications:    0.9 %  sodium chloride infusion, 250 mL, Intravenous, PRN, Agbata, Tochukwu, MD, Stopped at 06/03/21 0042   acetaminophen (TYLENOL) tablet 325-650 mg, 325-650 mg, Oral, Q6H PRN, Agbata, Tochukwu, MD, 650 mg at 06/01/21 1609  apixaban (ELIQUIS) tablet 2.5 mg, 2.5 mg, Oral, BID, Agbata, Tochukwu, MD, 2.5 mg at 06/03/21 0940   atorvastatin (LIPITOR) tablet 40 mg, 40 mg, Oral, Daily, Agbata, Tochukwu, MD, 40 mg at 06/03/21 0940   calcium carbonate (OS-CAL - dosed in mg of elemental calcium) tablet 1,250 mg, 1,250 mg, Oral, BID WC, Agbata, Tochukwu, MD, 1,250 mg at 06/03/21 0940   cefTRIAXone (ROCEPHIN) 2 g in sodium chloride 0.9 % 100 mL IVPB, 2 g, Intravenous, Q24H, Agbata, Tochukwu, MD, Stopped at 06/03/21 0009   guaiFENesin-dextromethorphan (ROBITUSSIN DM) 100-10 MG/5ML syrup 10 mL, 10 mL,  Oral, Q4H PRN, Agbata, Tochukwu, MD, 10 mL at 05/31/21 0041   hydrALAZINE (APRESOLINE) injection 10 mg, 10 mg, Intravenous, Q4H PRN, Amin, Ankit Chirag, MD   insulin aspart (novoLOG) injection 0-5 Units, 0-5 Units, Subcutaneous, QHS, Amin, Ankit Chirag, MD, 2 Units at 06/02/21 2108   insulin aspart (novoLOG) injection 0-9 Units, 0-9 Units, Subcutaneous, TID WC, Amin, Ankit Chirag, MD, 2 Units at 06/03/21 1217   ipratropium-albuterol (DUONEB) 0.5-2.5 (3) MG/3ML nebulizer solution 3 mL, 3 mL, Nebulization, BID, Amin, Ankit Chirag, MD, 3 mL at 06/03/21 0730   ipratropium-albuterol (DUONEB) 0.5-2.5 (3) MG/3ML nebulizer solution 3 mL, 3 mL, Nebulization, Q6H PRN, Amin, Ankit Chirag, MD   levothyroxine (SYNTHROID) tablet 88 mcg, 88 mcg, Oral, QAC breakfast, Agbata, Tochukwu, MD, 88 mcg at 06/03/21 0622   lisinopril (ZESTRIL) tablet 5 mg, 5 mg, Oral, Daily, Lanney Gins, Trayven Lumadue, MD, 5 mg at 06/03/21 0940   MEDLINE mouth rinse, 15 mL, Mouth Rinse, BID, Agbata, Tochukwu, MD, 15 mL at 06/03/21 0941   melatonin tablet 5 mg, 5 mg, Oral, QHS, Sharion Settler, NP, 5 mg at 06/02/21 2102   mirtazapine (REMERON) tablet 15 mg, 15 mg, Oral, QHS, Agbata, Tochukwu, MD, 15 mg at 06/02/21 2102   multivitamin-lutein (OCUVITE-LUTEIN) capsule 1 capsule, 1 capsule, Oral, QPM, Agbata, Tochukwu, MD, 1 capsule at 06/02/21 1749   ondansetron (ZOFRAN) tablet 4 mg, 4 mg, Oral, Q6H PRN **OR** ondansetron (ZOFRAN) injection 4 mg, 4 mg, Intravenous, Q6H PRN, Agbata, Tochukwu, MD   senna-docusate (Senokot-S) tablet 1 tablet, 1 tablet, Oral, QHS PRN, Amin, Ankit Chirag, MD   sodium chloride flush (NS) 0.9 % injection 3 mL, 3 mL, Intravenous, Q12H, Agbata, Tochukwu, MD, 3 mL at 06/03/21 0941   sodium chloride flush (NS) 0.9 % injection 3 mL, 3 mL, Intravenous, PRN, Agbata, Tochukwu, MD    ALLERGIES   Sulfa antibiotics     REVIEW OF SYSTEMS    Review of Systems:  Gen:  Denies  fever, sweats, chills weigh loss  HEENT: Denies  blurred vision, double vision, ear pain, eye pain, hearing loss, nose bleeds, sore throat Cardiac:  No dizziness, chest pain or heaviness, chest tightness,edema Resp:   Denies cough or sputum porduction, shortness of breath,wheezing, hemoptysis,  Gi: Denies swallowing difficulty, stomach pain, nausea or vomiting, diarrhea, constipation, bowel incontinence Gu:  Denies bladder incontinence, burning urine Ext:   Denies Joint pain, stiffness or swelling Skin: Denies  skin rash, easy bruising or bleeding or hives Endoc:  Denies polyuria, polydipsia , polyphagia or weight change Psych:   Denies depression, insomnia or hallucinations   Other:  All other systems negative   VS: BP (!) 169/70 (BP Location: Left Arm)    Pulse 69    Temp 98.6 F (37 C) (Oral)    Resp 18    Ht 5\' 2"  (1.575 m)    Wt 57.8 kg    SpO2 94%  BMI 23.32 kg/m      PHYSICAL EXAM    GENERAL:NAD, no fevers, chills, no weakness no fatigue HEAD: Normocephalic, atraumatic.  EYES: Pupils equal, round, reactive to light. Extraocular muscles intact. No scleral icterus.  MOUTH: Moist mucosal membrane. Dentition intact. No abscess noted.  EAR, NOSE, THROAT: Clear without exudates. No external lesions.  NECK: Supple. No thyromegaly. No nodules. No JVD.  PULMONARY:  CARDIOVASCULAR: S1 and S2. Regular rate and rhythm. No murmurs, rubs, or gallops. No edema. Pedal pulses 2+ bilaterally.  GASTROINTESTINAL: Soft, nontender, nondistended. No masses. Positive bowel sounds. No hepatosplenomegaly.  MUSCULOSKELETAL: No swelling, clubbing, or edema. Range of motion full in all extremities.  NEUROLOGIC: Cranial nerves II through XII are intact. No gross focal neurological deficits. Sensation intact. Reflexes intact.  SKIN: No ulceration, lesions, rashes, or cyanosis. Skin warm and dry. Turgor intact.  PSYCHIATRIC: Mood, affect within normal limits. The patient is awake, alert and oriented x 3. Insight, judgment intact.       IMAGING     DG Chest 1 View  Result Date: 05/31/2021 CLINICAL DATA:  Status post RIGHT thoracentesis. EXAM: CHEST  1 VIEW COMPARISON:  05/31/2021 and prior studies FINDINGS: Cardiomegaly again noted. Bilateral airspace opacities are slightly improved. RIGHT pleural effusion appears somewhat decreased. Bilateral LOWER lung consolidation/atelectasis again noted. There is no evidence of pneumothorax. IMPRESSION: Slightly improved bilateral airspace opacities and likely decreased RIGHT pleural effusion. No pneumothorax. Electronically Signed   By: Margarette Canada M.D.   On: 05/31/2021 14:18   CT HEAD WO CONTRAST (5MM)  Result Date: 05/30/2021 CLINICAL DATA:  Evaluate for subarachnoid hemorrhage. Complains of weakness. EXAM: CT HEAD WITHOUT CONTRAST TECHNIQUE: Contiguous axial images were obtained from the base of the skull through the vertex without intravenous contrast. RADIATION DOSE REDUCTION: This exam was performed according to the departmental dose-optimization program which includes automated exposure control, adjustment of the mA and/or kV according to patient size and/or use of iterative reconstruction technique. COMPARISON:  04/17/2021 FINDINGS: Brain: No evidence of acute infarction, hemorrhage, hydrocephalus, extra-axial collection or mass lesion/mass effect. Again noted are chronic left basal ganglia lacunar infarcts, image 16/2. There is mild diffuse low-attenuation within the subcortical and periventricular white matter compatible with chronic microvascular disease. Prominence of sulci and ventricles compatible with brain atrophy. Vascular: No hyperdense vessel or unexpected calcification. Skull: Postoperative change from right frontoparietal craniotomy. No fracture or focal lesion identified. Sinuses/Orbits: Unchanged chronic opacification of the right maxillary sinus. No acute findings. Other: None IMPRESSION: 1. No acute intracranial abnormalities. 2. Chronic small vessel ischemic disease and brain  atrophy. 3. Chronic left basal ganglia lacunar infarcts. Electronically Signed   By: Kerby Moors M.D.   On: 05/30/2021 09:48   CT CHEST WO CONTRAST  Result Date: 05/30/2021 CLINICAL DATA:  Shortness of breath.  Pneumonia. EXAM: CT CHEST WITHOUT CONTRAST TECHNIQUE: Multidetector CT imaging of the chest was performed following the standard protocol without IV contrast. RADIATION DOSE REDUCTION: This exam was performed according to the departmental dose-optimization program which includes automated exposure control, adjustment of the mA and/or kV according to patient size and/or use of iterative reconstruction technique. COMPARISON:  None. FINDINGS: Cardiovascular: The heart size is enlarged. No substantial pericardial effusion. Moderate atherosclerotic calcification is noted in the wall of the thoracic aorta. Coronary artery calcification is evident. Mediastinum/Nodes: Mild mediastinal lymphadenopathy evident. 1.3 cm short axis precarinal node identified on image 60/2. 1.2 cm short axis subcarinal node identified on 74/2 No evidence for gross hilar lymphadenopathy although assessment is  limited by the lack of intravenous contrast on the current study. The esophagus has normal imaging features. 1.6 cm right thyroid nodule evident. Lungs/Pleura: Interlobular septal thickening with patchy ground-glass airspace disease noted right upper lobe. Consolidative airspace disease with some volume loss noted in the lower lobes bilaterally, right greater than left. Moderate right with tiny left pleural effusions. Upper Abdomen: Unremarkable. Musculoskeletal: No worrisome lytic or sclerotic osseous abnormality. Old posterior right rib fractures evident. IMPRESSION: 1. Moderate right with tiny left pleural effusions. 2. Patchy ground-glass opacity in the right upper lobe is associated with bibasilar collapse/consolidation, right greater than left. Imaging features suggest multifocal infection. Follow-up recommended to ensure  resolution. 3. Mild mediastinal lymphadenopathy, likely reactive. This could also be reassessed at the time of follow-up. 4. 1.6 cm right thyroid nodule. This has been evaluated on previous imaging. (ref: J Am Coll Radiol. 2015 Feb;12(2): 143-50). 5. Aortic Atherosclerosis (ICD10-I70.0). Electronically Signed   By: Misty Stanley M.D.   On: 05/30/2021 15:01   DG Chest Port 1 View  Result Date: 06/02/2021 CLINICAL DATA:  Shortness of breath, hypoxia EXAM: PORTABLE CHEST 1 VIEW COMPARISON:  Previous studies including the examination done on 05/31/2021 FINDINGS: Transverse diameter of heart is increased. There is interval improvement in aeration in the right parahilar region. There is prominence of central pulmonary vessels. There is residual prominence of interstitial markings in both parahilar regions and lower lung fields. There is blunting of lateral CP angles. There is no pneumothorax. IMPRESSION: Cardiomegaly. There is improvement in aeration of right parahilar region suggesting resolving pneumonia or resolving asymmetric pulmonary edema. There is residual prominence of interstitial markings in both parahilar regions and lower lung fields suggesting residual interstitial edema or interstitial pneumonia. Small bilateral pleural effusions are seen. Electronically Signed   By: Elmer Picker M.D.   On: 06/02/2021 16:32   DG Chest Port 1 View  Result Date: 05/31/2021 CLINICAL DATA:  88 year old female with hypoxia. EXAM: PORTABLE CHEST 1 VIEW COMPARISON:  Chest CT and portable radiographs yesterday and earlier. FINDINGS: Portable AP upright view at 0512 hours. Combined veiling and confluent opacity throughout the right lung compatible with multilobar infection and pleural effusion demonstrated by CT yesterday. Lower lung volumes and right lung base ventilation has worsened. Contralateral left lower lobe atelectasis versus consolidation. Stable cardiomegaly and mediastinal contours. No pneumothorax. Stable  visualized osseous structures. IMPRESSION: Lower lung volumes with worsening right lung ventilation from the CT yesterday most suggestive of multilobar right lung infection with pleural effusion. Ongoing left lung base atelectasis versus infection. Electronically Signed   By: Genevie Ann M.D.   On: 05/31/2021 05:32   DG Chest Port 1 View  Result Date: 05/30/2021 CLINICAL DATA:  Questionable sepsis EXAM: PORTABLE CHEST 1 VIEW COMPARISON:  Chest x-ray 04/16/2021 FINDINGS: Heart is enlarged. Mediastinum appears stable. Calcified plaques in the aortic arch. Extensive increased ground-glass opacities in the right lung with possible small right pleural effusion. Left lung appears grossly clear. No pneumothorax identified. IMPRESSION: Extensive opacities throughout the right lung which are new since previous study and concerning for pneumonia. Likely small right pleural effusion. Electronically Signed   By: Ofilia Neas M.D.   On: 05/30/2021 09:33   US THORACENTESIS ASP PLEURAL SPACE W/IMG GUIDE  Result Date: 05/31/2021 INDICATION: History of CHF, DM, HTN and AFib. Patient admitted for shortness of breath, found to right pleural effusion. Request for diagnostic and therapeutic right-sided thoracentesis. EXAM: ULTRASOUND GUIDED DIAGNOSTIC THERAPEUTIC RIGHT THORACENTESIS MEDICATIONS: 10 mL 1 % lidocaine COMPLICATIONS: None  immediate. PROCEDURE: An ultrasound guided thoracentesis was thoroughly discussed with the patient and questions answered. The benefits, risks, alternatives and complications were also discussed. The patient understands and wishes to proceed with the procedure. Written consent was obtained. Ultrasound was performed to localize and mark an adequate pocket of fluid in the right chest. The area was then prepped and draped in the normal sterile fashion. 1% Lidocaine was used for local anesthesia. Under ultrasound guidance a 6 Fr Safe-T-Centesis catheter was introduced. Thoracentesis was performed. The  catheter was removed and a dressing applied. FINDINGS: A total of approximately 650 cc of clear, amber fluid was removed. Samples were sent to the laboratory as requested by the clinical team. IMPRESSION: Successful ultrasound guided right thoracentesis yielding 650 cc of pleural fluid. Read by: Narda Rutherford, AGNP-BC Electronically Signed   By: Markus Daft M.D.   On: 05/31/2021 15:38      ASSESSMENT/PLAN   Acute hypoxemic respiratory failure    Patient with compressive atelectasis due to moderate pleural effusion with possible CAP vs pulmonary edema.  - she is on diuretics and antibiotics -patient is on eliquis and we are holding this for now due to ordered thoracentesis   Moderate right pleural effusion    -hold eliquis for now   -thoracentesis ordered - patient is agreeable to procedure.    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   Patient/Family are satisfied with care plan and all questions have been answered.  This document was prepared using Dragon voice recognition software and may include unintentional dictation errors.     Ottie Glazier, M.D.  Division of [place]

## 2021-06-03 NOTE — Progress Notes (Signed)
Physical Therapy Treatment Patient Details Name: [name] MRN: [id number] DOB: 03/17/1926 Today's Date: 06/03/2021   History of Present Illness Pt is a 88 y/o F admitted on 05/30/21 after presenting with c/c of SOB. Chest x-ray shows extensive opacities throughout the right lung concerning for pneumonia, small right pleural effusion. Pt is being tx for PNA with ARF. PMH: CVA with R sided hemiparesis, PAD s/p R AKA, DM, stage IV CKD, HTN, dyslipidemia, a-fib, CHF    PT Comments    Pt received supine in bed agreeable to PT services. Son present at bedside. Pt tolerating LE therex well in bed with brief desat on RA to 86-87%. Quick return to >/= 90% with rest and PLB. Overall pt requiring minA to transfer to EOB with HOB elevated and bed features and assit with scooting to EOB for LLE to reach floor. Overall able to maintain minA+2 to stand to RW and modA+2 to SPT to recliner with cuing for RW sequencing. Son assisted with transfer assisting with RW while Pt maintained pt assist. SPO2 remained 90-91% after transfer. Pt and family remain confident in assisting pt with all scoot and SPT's at home environment. Health team updated on SPO2 sats during functional activity. D/c recs remain appropriate.    Recommendations for follow up therapy are one component of a multi-disciplinary discharge planning process, led by the attending physician.  Recommendations may be updated based on patient status, additional functional criteria and insurance authorization.  Follow Up Recommendations  Home health PT     Assistance Recommended at Discharge Frequent or constant Supervision/Assistance  Patient can return home with the following A lot of help with bathing/dressing/bathroom;Two people to help with walking and/or transfers;Assistance with cooking/housework;Direct supervision/assist for financial management;Assist for transportation;Help with stairs or ramp for entrance;Direct supervision/assist for medications  management;Assistance with feeding   Equipment Recommendations       Recommendations for Other Services       Precautions / Restrictions Precautions Precautions: Fall Restrictions Weight Bearing Restrictions: No     Mobility  Bed Mobility Overal bed mobility: Needs Assistance Bed Mobility: Supine to Sit     Supine to sit: Min assist       Patient Response: Cooperative, Flat affect  Transfers Overall transfer level: Needs assistance Equipment used: Rolling walker (2 wheels) Transfers: Bed to chair/wheelchair/BSC   Stand pivot transfers: Mod assist, +2 physical assistance         General transfer comment: Requires 2 person assist for safe pivot to recliner and VC's for hand placement.    Ambulation/Gait               General Gait Details: N/a   Stairs             Wheelchair Mobility    Modified Rankin (Stroke Patients Only)       Balance                                            Cognition Arousal/Alertness: Awake/alert Behavior During Therapy: WFL for tasks assessed/performed Overall Cognitive Status: Within Functional Limits for tasks assessed                                          Exercises General Exercises - Lower Extremity Ankle Circles/Pumps: AROM,  Strengthening, Left, Supine, 15 reps Hip ABduction/ADduction: AROM, Strengthening, Right, Left, 20 reps, Supine Straight Leg Raises: AROM, Strengthening, Both, Supine, 10 reps Other Exercises Other Exercises: Pursed lip breathing    General Comments        Pertinent Vitals/Pain Pain Assessment Pain Assessment: No/denies pain    Home Living                          Prior Function            PT Goals (current goals can now be found in the care plan section) Acute Rehab PT Goals Patient Stated Goal: regain as much independence as possible PT Goal Formulation: With patient/family Time For Goal Achievement:  06/14/21 Potential to Achieve Goals: Fair Progress towards PT goals: Progressing toward goals    Frequency    Min 2X/week      PT Plan Current plan remains appropriate    Co-evaluation              AM-PAC PT "6 Clicks" Mobility   Outcome Measure  Help needed turning from your back to your side while in a flat bed without using bedrails?: A Little Help needed moving from lying on your back to sitting on the side of a flat bed without using bedrails?: Total Help needed moving to and from a bed to a chair (including a wheelchair)?: A Lot Help needed standing up from a chair using your arms (e.g., wheelchair or bedside chair)?: A Lot Help needed to walk in hospital room?: Total Help needed climbing 3-5 steps with a railing? : Total 6 Click Score: 10    End of Session Equipment Utilized During Treatment: Gait belt Activity Tolerance: Patient tolerated treatment well Patient left: in chair;with call bell/phone within reach;with family/visitor present Nurse Communication: Mobility status PT Visit Diagnosis: Muscle weakness (generalized) (M62.81);Other abnormalities of gait and mobility (R26.89)     Time: 1100-1120 PT Time Calculation (min) (ACUTE ONLY): 20 min  Charges:  $Therapeutic Exercise: 8-22 mins                     Amiley Shishido M. Fairly IV, PT, DPT Physical Therapist- [facility]  06/03/2021, 12:10 PM

## 2021-06-04 LAB — CULTURE, BLOOD (ROUTINE X 2)
Culture: NO GROWTH
Culture: NO GROWTH
Special Requests: ADEQUATE
Special Requests: ADEQUATE

## 2021-06-04 LAB — BODY FLUID CULTURE W GRAM STAIN: Culture: NO GROWTH

## 2021-06-08 ENCOUNTER — Encounter (INDEPENDENT_AMBULATORY_CARE_PROVIDER_SITE_OTHER): Payer: Medicare Other

## 2021-06-08 ENCOUNTER — Ambulatory Visit (INDEPENDENT_AMBULATORY_CARE_PROVIDER_SITE_OTHER): Payer: Medicare Other | Admitting: Vascular Surgery

## 2021-06-09 NOTE — Progress Notes (Signed)
Patient ID: [name], female    DOB: 1925/07/05, 88 y.o.   MRN: [id number]  HPI  [name] is a 88 y/o female with a history of DM, HTN, stroke, atrial fibrillation and chronic heart failure.   Echo report from 04/15/21 reviewed and showed an EF of 50-55% along with mild MR and mild/moderate AS. No wall motion abnormalities.   Admitted 05/30/21 due to SOB due to fluid overload, right-sided pleural effusion with possibly multifocal pneumonia. Initially given IV lasix and IV antibiotics. Pulmonology consult obtained. Thoracentesis on 2/19 and 650 cc was removed. PT/OT/speech evaluations done. Weaned off of oxygen. Diuretic and antibiotics were changed to oral formulation. Discharged after 4 days.   She presents for her initial visit with a chief complaint of minimal fatigue upon moderate exertion. She describes this as chronic in nature. She has associated dry cough, pedal edema and occasional light-headedness along with this. She denies any difficulty sleeping, abdominal distention, palpitations, chest pain or shortness of breath.     Has been on carvedilol in the past intermittently as it gets stopped due to bradycardia but then will get resumed again.   Has scales at home but hasn't been weighing because she says that she can't stand without support (right leg amputee)  Past Medical History:  Diagnosis Date   A-fib [facility])    CHF (congestive heart failure) ([place])    Diabetes mellitus without complication ([place])    Hypertension    Stroke [facility])    Past Surgical History:  Procedure Laterality Date   AMPUTATION Right 10/10/2019   Procedure: AMPUTATION ABOVE KNEE;  Surgeon: Katha Cabal, MD;  Location: [facility] ORS;  Service: Vascular;  Laterality: Right;   CHOLECYSTECTOMY     LOWER EXTREMITY ANGIOGRAPHY Right 08/12/2017   Procedure: LOWER EXTREMITY ANGIOGRAPHY;  Surgeon: Katha Cabal, MD;  Location: [place] CV LAB;  Service: Cardiovascular;  Laterality: Right;   LOWER EXTREMITY  ANGIOGRAPHY Right 06/13/2018   Procedure: LOWER EXTREMITY ANGIOGRAPHY;  Surgeon: Katha Cabal, MD;  Location: [place] CV LAB;  Service: Cardiovascular;  Laterality: Right;   LOWER EXTREMITY ANGIOGRAPHY Right 03/01/2019   Procedure: LOWER EXTREMITY ANGIOGRAPHY;  Surgeon: Katha Cabal, MD;  Location: [place] CV LAB;  Service: Cardiovascular;  Laterality: Right;   LOWER EXTREMITY ANGIOGRAPHY Right 07/31/2019   Procedure: LOWER EXTREMITY ANGIOGRAPHY;  Surgeon: Katha Cabal, MD;  Location: [place] CV LAB;  Service: Cardiovascular;  Laterality: Right;   LOWER EXTREMITY ANGIOGRAPHY Right 10/04/2019   Procedure: Lower Extremity Angiography;  Surgeon: Katha Cabal, MD;  Location: [place] CV LAB;  Service: Cardiovascular;  Laterality: Right;   Family History  Problem Relation Age of Onset   Cancer Brother    Social History   Tobacco Use   Smoking status: Never   Smokeless tobacco: Never  Substance Use Topics   Alcohol use: No   Allergies  Allergen Reactions   Sulfa Antibiotics Hives   Prior to Admission medications   Medication Sig Start Date End Date Taking? Authorizing Provider  ACCU-CHEK AVIVA PLUS test strip  04/24/19  Yes [provider]  acetaminophen (TYLENOL) 325 MG tablet Take 325-650 mg by mouth every 6 (six) hours as needed for headache.    Yes [provider]  atorvastatin (LIPITOR) 40 MG tablet Take 1 tablet (40 mg total) by mouth daily. 04/26/21  Yes Danford, Suann Larry, MD  calcium carbonate (OSCAL) 1500 (600 Ca) MG TABS tablet Take by mouth daily with breakfast.  Yes [provider]  ELIQUIS 2.5 MG TABS tablet Take 2.5 mg by mouth 2 (two) times daily. 05/20/21  Yes [provider]  HUMALOG KWIKPEN 100 UNIT/ML KwikPen Inject 3 Units into the skin 3 (three) times daily after meals. Hold if eats <50% of meal 04/25/21  Yes Danford, Suann Larry, MD  levothyroxine (SYNTHROID, LEVOTHROID) 88 MCG tablet  Take 88 mcg by mouth daily before breakfast.  05/26/17  Yes [provider]  lisinopril (ZESTRIL) 5 MG tablet Take 5 mg by mouth daily.  12/22/18  Yes [provider]  mirtazapine (REMERON) 15 MG tablet Take 1 tablet by mouth at bedtime. 05/20/21  Yes [provider]  Multiple Vitamins-Minerals (PRESERVISION AREDS 2 PO) Take 1 tablet by mouth every evening.   Yes [provider]  nystatin (MYCOSTATIN/NYSTOP) powder Apply topically. 05/15/21  Yes [provider]  Vitamin D, Cholecalciferol, 25 MCG (1000 UT) CAPS Take 1 capsule by mouth daily.   Yes [provider]   Review of Systems  Constitutional:  Positive for fatigue. Negative for appetite change.  HENT:  Negative for congestion, postnasal drip and sore throat.   Eyes: Negative.   Respiratory:  Positive for cough (dry). Negative for shortness of breath.   Cardiovascular:  Positive for leg swelling. Negative for chest pain and palpitations.  Gastrointestinal:  Negative for abdominal distention and abdominal pain.  Endocrine: Negative.   Genitourinary: Negative.   Musculoskeletal:  Negative for back pain and neck pain.  Skin: Negative.   Allergic/Immunologic: Negative.   Neurological:  Positive for light-headedness. Negative for dizziness.  Hematological:  Negative for adenopathy. Bruises/bleeds easily.  Psychiatric/Behavioral:  Negative for decreased concentration and sleep disturbance (sleeping on 2 pillows). The patient is not nervous/anxious.    Vitals:   06/10/21 1008  BP: (!) 174/66  Pulse: 73  Resp: 16  SpO2: 99%  Height: 5\' 2"  (1.575 m)   Wt Readings from Last 3 Encounters:  05/30/21 127 lb 8 oz (57.8 kg)  04/25/21 126 lb 12.2 oz (57.5 kg)  02/04/21 107 lb (48.5 kg)   Lab Results  Component Value Date   CREATININE 1.35 (H) 06/03/2021   CREATININE 1.60 (H) 06/02/2021   CREATININE 1.68 (H) 06/01/2021   Physical Exam Vitals and nursing note reviewed. Exam conducted with  a chaperone present (son).  Constitutional:      Appearance: Normal appearance.  HENT:     Head: Normocephalic and atraumatic.  Cardiovascular:     Rate and Rhythm: Normal rate and regular rhythm.  Pulmonary:     Effort: Pulmonary effort is normal. No respiratory distress.     Breath sounds: No wheezing or rales.  Abdominal:     General: There is no distension.     Palpations: Abdomen is soft.  Musculoskeletal:        General: Deformity (right leg AKA) present. No tenderness.     Cervical back: Normal range of motion and neck supple.     Left lower leg: Edema (1+ pitting) present.  Skin:    General: Skin is warm and dry.  Neurological:     General: No focal deficit present.     Mental Status: She is alert and oriented to person, place, and time. Mental status is at baseline.  Psychiatric:        Mood and Affect: Mood normal.        Behavior: Behavior normal.        Thought Content: Thought content normal.   Assessment &  Plan:  1: Chronic heart failure with preserved ejection fraction without structural changes- - NYHA class II - euvolemic today - has scales but hasn't been weighing due to begin unsteady; suggested that they put her walker over the scale and see if she can weigh safely that way so she can call for an overnight weight gain of > 2 pounds or a weekly weight gain of > 5 pounds; if any concern about her safely standing, advised to NOT weigh - not adding salt; low sodium cookbook provided - palliative care visit done 05/06/21 - will resume low dose carvedilol (3.125mg ) BID; has a history of bradycardia - consider SGLT2 - BNP 05/31/21 was 601.3 - PharmD reconciled medications with the patient  2: HTN- - BP elevated (174/66) - saw PCP Ellison Hughs) 05/28/21 - saw nephrology Holley Raring) 03/19/22 - BMP 06/03/21 reviewed and showed sodium 141, potassium 4.0, creatinine 1.35 and GFR 37  3: DM- - saw endocrinology Honor Junes) 05/19/21 - A1c 05/19/21 was 7.8%  4: Atrial  fibrillation- - saw cardiology Nehemiah Massed) 11/05/20; returns 07/07/21   Patient did not bring her medications nor a list. Each medication was verbally reviewed with the patient and she was encouraged to bring the bottles to every visit to confirm accuracy of list.   Return in 1 month, sooner if needed.

## 2021-06-10 ENCOUNTER — Encounter: Payer: Self-pay | Admitting: Pharmacist

## 2021-06-10 ENCOUNTER — Ambulatory Visit: Payer: Medicare Other | Attending: Family | Admitting: Family

## 2021-06-10 ENCOUNTER — Encounter: Payer: Self-pay | Admitting: Family

## 2021-06-10 ENCOUNTER — Other Ambulatory Visit: Payer: Self-pay

## 2021-06-10 VITALS — BP 174/66 | HR 73 | Resp 16 | Ht 62.0 in

## 2021-06-10 DIAGNOSIS — I110 Hypertensive heart disease with heart failure: Secondary | ICD-10-CM | POA: Diagnosis present

## 2021-06-10 DIAGNOSIS — J90 Pleural effusion, not elsewhere classified: Secondary | ICD-10-CM | POA: Insufficient documentation

## 2021-06-10 DIAGNOSIS — E1122 Type 2 diabetes mellitus with diabetic chronic kidney disease: Secondary | ICD-10-CM | POA: Diagnosis not present

## 2021-06-10 DIAGNOSIS — Z79899 Other long term (current) drug therapy: Secondary | ICD-10-CM | POA: Diagnosis not present

## 2021-06-10 DIAGNOSIS — Z8673 Personal history of transient ischemic attack (TIA), and cerebral infarction without residual deficits: Secondary | ICD-10-CM | POA: Insufficient documentation

## 2021-06-10 DIAGNOSIS — E119 Type 2 diabetes mellitus without complications: Secondary | ICD-10-CM | POA: Insufficient documentation

## 2021-06-10 DIAGNOSIS — Z89611 Acquired absence of right leg above knee: Secondary | ICD-10-CM | POA: Diagnosis not present

## 2021-06-10 DIAGNOSIS — I5032 Chronic diastolic (congestive) heart failure: Secondary | ICD-10-CM | POA: Insufficient documentation

## 2021-06-10 DIAGNOSIS — I480 Paroxysmal atrial fibrillation: Secondary | ICD-10-CM | POA: Diagnosis not present

## 2021-06-10 DIAGNOSIS — N1832 Chronic kidney disease, stage 3b: Secondary | ICD-10-CM

## 2021-06-10 DIAGNOSIS — Z794 Long term (current) use of insulin: Secondary | ICD-10-CM

## 2021-06-10 DIAGNOSIS — I10 Essential (primary) hypertension: Secondary | ICD-10-CM | POA: Diagnosis not present

## 2021-06-10 DIAGNOSIS — I4891 Unspecified atrial fibrillation: Secondary | ICD-10-CM | POA: Insufficient documentation

## 2021-06-10 NOTE — Patient Instructions (Addendum)
Begin weighing daily (if safely can stand) and call for an overnight weight gain of 3 pounds or more or a weekly weight gain of more than 5 pounds. ? ? ?Begin carvedilol 3.125mg  in the morning and again in the evening.  ?

## 2021-06-10 NOTE — Progress Notes (Signed)
[place] COUNSELING NOTE ? ?*HFpEF* ? ?Guideline-Directed Medical Therapy/Evidence Based Medicine ? ?ACE/ARB/ARNI: Lisinopril 5 mg daily ?Beta Blocker:  none ?Aldosterone Antagonist:  none ?Diuretic:  none ?SGLT2i:  none ? ?Adherence Assessment ? ?Do you ever forget to take your medication? [] Yes ?[x] No  ?Do you ever skip doses due to side effects? [] Yes ?[x] No  ?Do you have trouble affording your medicines? [] Yes ?[x] No  ?Are you ever unable to pick up your medication due to transportation difficulties? [] Yes ?[x] No  ?Do you ever stop taking your medications because you don't believe they are helping? [] Yes ?[x] No  ?Do you check your weight daily? [] Yes ?[x] No  ? ?Adherence strategy: pill box ? ?Barriers to obtaining medications: none - well cover by insurance ? ?Vital signs: HR 73, BP 174/66 ?ECHO: Date 04/15/21, EF 50-55%, mild MR and mild/moderate AS ? ?BMP Latest Ref Rng & Units 06/03/2021 06/02/2021 06/01/2021  ?Glucose 70 - 99 mg/dL 130(H) 132(H) 134(H)  ?BUN 8 - 23 mg/dL 35(H) 38(H) 36(H)  ?Creatinine 0.44 - 1.00 mg/dL 1.35(H) 1.60(H) 1.68(H)  ?Sodium 135 - 145 mmol/L 141 144 143  ?Potassium 3.5 - 5.1 mmol/L 4.0 3.9 3.6  ?Chloride 98 - 111 mmol/L 103 107 110  ?CO2 22 - 32 mmol/L 30 28 26   ?Calcium 8.9 - 10.3 mg/dL 8.9 8.6(L) 8.7(L)  ? ? ?Past Medical History:  ?Diagnosis Date  ? A-fib ([place])   ? CHF (congestive heart failure) ([place])   ? Diabetes mellitus without complication ([place])   ? Hypertension   ? Stroke [facility])   ? ? ?ASSESSMENT ?88 year old female who presents to the HF clinic as new patient.  PMH includes HFpEF, DM, A.fib, embolic stroke, and HTN. ? ?Recent ED Visit (past 6 months): Date - 05/30/21, CC - pneumonia & fluid overloaded ?Date 04/13/2021, CC - stroke (received tnk) ? ?Patient presnets in good spirint and accomany by her son. Denies adverse reaction or problems with current therapy. Reports compliance with all prescribed medication. Noted BP  significantly elevated today, and HR in the 70s. Patient was previously on low dose carvedilol, but stopped during acute admission for stroke. ? ?PLAN ?Resume carvedilol at 3.125mg  BID ?Continue all other medication as prescribed ?Plan to add SGLT2i at next office visit ? ? ?Time spent: 15 minutes ? ?Tristen Pennino Rodriguez-Guzman PharmD, BCPS ?06/10/2021 10:40 AM ? ? ?Current Outpatient Medications:  ?  ACCU-CHEK AVIVA PLUS test strip, , Disp: , Rfl:  ?  acetaminophen (TYLENOL) 325 MG tablet, Take 325-650 mg by mouth every 6 (six) hours as needed for headache. , Disp: , Rfl:  ?  atorvastatin (LIPITOR) 40 MG tablet, Take 1 tablet (40 mg total) by mouth daily., Disp: , Rfl:  ?  calcium carbonate (OSCAL) 1500 (600 Ca) MG TABS tablet, Take by mouth daily with breakfast., Disp: , Rfl:  ?  ELIQUIS 2.5 MG TABS tablet, Take 2.5 mg by mouth 2 (two) times daily., Disp: , Rfl:  ?  HUMALOG KWIKPEN 100 UNIT/ML KwikPen, Inject 3 Units into the skin 3 (three) times daily after meals. Hold if eats <50% of meal, Disp: 15 mL, Rfl: 11 ?  levothyroxine (SYNTHROID, LEVOTHROID) 88 MCG tablet, Take 88 mcg by mouth daily before breakfast. , Disp: , Rfl:  ?  lisinopril (ZESTRIL) 5 MG tablet, Take 5 mg by mouth daily. , Disp: , Rfl:  ?  mirtazapine (REMERON) 15 MG tablet, Take 1 tablet by mouth at bedtime., Disp: , Rfl:  ?  Multiple Vitamins-Minerals (PRESERVISION AREDS 2 PO), Take 1 tablet by mouth every evening., Disp: , Rfl:  ?  nystatin (MYCOSTATIN/NYSTOP) powder, Apply topically., Disp: , Rfl:  ?  Vitamin D, Cholecalciferol, 25 MCG (1000 UT) CAPS, Take 1 capsule by mouth daily., Disp: , Rfl:  ? ? ?MEDICATION ADHERENCES TIPS AND STRATEGIES ?Taking medication as prescribed improves patient outcomes in heart failure (reduces hospitalizations, improves symptoms, increases survival) ?Side effects of medications can be managed by decreasing doses, switching agents, stopping drugs, or adding additional therapy. Please let someone in the [facility] know if you have having bothersome side effects so we can modify your regimen. Do not alter your medication regimen without talking to [place].  ?Medication reminders can help patients remember to take drugs on time. If you are missing or forgetting doses you can try linking behaviors, using pill boxes, or an electronic reminder like an alarm on your phone or an app. Some people can also get automated phone calls as medication reminders.  ? ?

## 2021-06-18 ENCOUNTER — Other Ambulatory Visit: Payer: Self-pay

## 2021-06-18 ENCOUNTER — Other Ambulatory Visit: Payer: Medicare Other | Admitting: Primary Care

## 2021-06-18 DIAGNOSIS — I63432 Cerebral infarction due to embolism of left posterior cerebral artery: Secondary | ICD-10-CM

## 2021-06-18 DIAGNOSIS — E1151 Type 2 diabetes mellitus with diabetic peripheral angiopathy without gangrene: Secondary | ICD-10-CM

## 2021-06-18 DIAGNOSIS — Z515 Encounter for palliative care: Secondary | ICD-10-CM

## 2021-06-18 DIAGNOSIS — M79605 Pain in left leg: Secondary | ICD-10-CM

## 2021-06-18 DIAGNOSIS — I5032 Chronic diastolic (congestive) heart failure: Secondary | ICD-10-CM

## 2021-06-18 NOTE — Progress Notes (Signed)
 Therapist, nutritional Palliative Care Consult Note Telephone: [phone]  Fax: [phone]    Date of encounter: 06/18/21 1:52 PM PATIENT NAME: [name] [address] [place] [address]   [phone] (home) [phone] (work) DOB: 05-Jan-1926 MRN: [id number] PRIMARY CARE PROVIDER:    Jeffie Cheryl BRAVO, MD,  [address] [facility] [place] [address] [phone]  REFERRING PROVIDER:   Jeffie Cheryl BRAVO, MD [address] [place],  [place] [address] [phone]  RESPONSIBLE PARTY:    Contact Information     Name Relation Home Work Mobile   Gwenn Race [facility] Daughter   [phone]   Wildasin, Powell Granddaughter   [phone]   Siller III,John Son [phone]  [phone]       I met face to face with patient and family in the  home. Two daughters, son and granddaughter were present Palliative Care was asked to follow this patient by consultation request of  Feldpausch, Cheryl BRAVO, MD to address advance care planning and complex medical decision making. This is a follow up visit.                                   ASSESSMENT AND PLAN / RECOMMENDATIONS:   Advance Care Planning/Goals of Care: Goals include to maximize quality of life and symptom management. Patient/health care surrogate gave his/her permission to discuss.Our advance care planning conversation included a discussion about:    The value and importance of advance care planning  Experiences with loved ones who have been seriously ill or have died - Pt husband Exploration of personal, cultural or spiritual beliefs that might influence medical decisions  Exploration of goals of care in the event of a sudden injury or illness  Identification of a healthcare agent - daughter Race Aliment of an  advance directive document : Documents were taken during recent hospital stay, and not returned. Reviewed and recreated today. Decision de-escalate disease focused treatments due to poor  prognosis. Discussed patient decline, starting with CVA then pneumonia, then AMS whereby she did not arouse. They sent to ED per EMS, but she spontaneously awoke. Family does not want to continue to put her through appts and treatments, and patient voices she does not want to pursue treatments. She states she is ready when it's her time. Reviewed recent AMS episode where she could not wake up.Family outlined they would likely still call EMS but all agreed they were not planning to pursue advanced level treatments.  Currently on home health services, but she's refused OT and PT . Her goal was to return to her baseline prior to stroke. We discussed her having to do exercises to get there, and she feels great fatigue. They may call back for continued PT and OT especially for toilet transfer, but she may be approaching hospice eligibility. Family state this is their goal once she's eligible. CODE STATUS: DNR  I completed a MOST form today. The patient and family outlined their wishes for the following treatment decisions:  Cardiopulmonary Resuscitation: Do Not Attempt Resuscitation (DNR/No CPR)  Medical Interventions: Comfort Measures: Keep clean, warm, and dry. Use medication by any route, positioning, wound care, and other measures to relieve pain and suffering. Use oxygen, suction and manual treatment of airway obstruction as needed for comfort. Do not transfer to the hospital unless comfort needs cannot be met in current location.  Antibiotics: Antibiotics if indicated  IV Fluids:  IV fluids for a defined trial period  Feeding Tube: No feeding tube    I spent 50 minutes providing this consultation. More than 50% of the time in this consultation was spent in counseling and care coordination. _________________________________________________________________________  Symptom Management/Plan:  Nutrition: Eating and able to prep food. Has albumin 3.3. Wt is difficult to assess due to amputation, and  confounded by 1-2+ LE edema. Current weight noted in chart at 127 lbs. Earlier wt in the fall was 136 lbs, an 8% loss in 4 months. Avoids sodium as much as possible.   Renal Failure Stage 4, Gfr on hospital stay 37 most recent value, 3 weeks ago was 26. Taking PO better now but does not drink much water. We discussed her drinking water enough and not avoiding due to toileting effort.  ADLs : pt is able to do some transfers from w/c to toilet but has become more onerous. She will transfer 180 degrees. We discussed her asking PT to bring a sliding board to tranfer, take off sides of w/c, and look for drop arm BSC. Her one goal is to toilet herself, and this would be better effected with OT and PT input. Pt endorses having told them she didn't want to take therapy, but today we discussed this would be the way for her to achieve her goal.  Debility: Endorses reassessment of goals in light of decreased function, age and decreased desire for aggressive measures. Endorses her strength has not returned to her baseline and they are not wanting to take her out to appointments due to taxing effort for her. They are considering a home based PCP for home management for the short term and to continue with hospice once she is eligible.  DM management: Patient A1C 7.8%.  I think this is reasonable in light of her comfort oriented goals. Family also requested to simplify diabetic management. She currently has a sliding scale that starts at 47 which she is doing ac, and giving 3-6 units of humulog, and 8 Units at hs without bg checks. She has a Chief Operating Officer also and I asked them to set alarm to 80 low and 400 high. I would suggest she stop the meal time insulin  and roll the approx 20 units of regular into a basal dose. Unclear if she is using a basilar insulin  as well. Family is feeling there have been a lot of med changes recently, plus loss of her endocrine provider, which has been frustrating to them. Family requested  to stop specialist visits and to simplify her regimen as much as possible.   BP: today is elevated at 176/72. LCTA but has 2 + LE edema to mid tibia. Instructed to give lasix  for increased bp and edema. Will revisit.  Follow up Palliative Care Visit: Palliative care will continue to follow for complex medical decision making, advance care planning, and clarification of goals. Return 3-4 weeks or prn.  This visit was coded based on medical decision making (MDM).  PPS: 30%  HOSPICE ELIGIBILITY/DIAGNOSIS: TBD  Chief Complaint: debility, self care deficits, DM and bg regulation  HISTORY OF PRESENT ILLNESS:  [name] is a 88 y.o. year old female  with insulin  dependence DM2, L AKA, recent hospital stays (CVA, pneumonia and AMS), and  goals to de escalate curative treatments. Presents for f/u from hospital stays to discuss goals for ACP as well as medical management, insulin  management and mobility impairment.  History obtained from review of EMR, discussion with primary team, and  interview with family, facility staff/caregiver and/or Ms. Deyton.  I reviewed available labs, medications, imaging, studies and related documents from the EMR.  Records reviewed and summarized above.   ROS   General: NAD EYES: denies vision changes, has glasses  ENMT: denies dysphagia Cardiovascular: denies chest pain, denies DOE, endorses increased fatigue Pulmonary: denies cough, denies increased SOB Abdomen: endorses fair to good appetite, denies constipation, endorses continence of bowel GU: denies dysuria, endorses continence of urine MSK:  endorses   increased weakness,  no falls reported Skin: denies rashes or wounds Neurological: endorses chronic L phantom pain pain, denies insomnia Psych: Endorses positive mood Heme/lymph/immuno: denies bruises, abnormal bleeding  Physical Exam: Current and past weights: 127 lbs per record, 8% loss in 4 months Constitutional: frail, NAD General: frail appearing,  thin EYES: anicteric sclera, lids intact, no discharge  ENMT: intact hearing, oral mucous membranes moist, dentition intact CV: S1S2, murmur, irreg rate, 1-2+  LE edema Pulmonary: LCTA, no increased work of breathing, no cough, room air Abdomen: intake 75%,  no ascites GU: deferred MSK: advanced  sarcopenia, moves all extremities, non ambulatory, L AKA, mobility in w/c in home Skin: warm and dry, no rashes or wounds on visible skin Neuro:  + increased  generalized weakness,  no cognitive impairment, fatigued Psych: non-anxious affect, A and O x 3 Hem/lymph/immuno: no widespread bruising  Outpatient Encounter Medications as of 06/18/2021  Medication Sig   ACCU-CHEK AVIVA PLUS test strip    acetaminophen  (TYLENOL ) 325 MG tablet Take 325-650 mg by mouth every 6 (six) hours as needed for headache.    atorvastatin  (LIPITOR) 40 MG tablet Take 1 tablet (40 mg total) by mouth daily.   calcium  carbonate (OSCAL) 1500 (600 Ca) MG TABS tablet Take by mouth daily with breakfast.   carvedilol  (COREG ) 6.25 MG tablet Take 6.25 mg by mouth 2 (two) times daily with a meal.   ELIQUIS  2.5 MG TABS tablet Take 2.5 mg by mouth 2 (two) times daily.   HUMALOG  KWIKPEN 100 UNIT/ML KwikPen Inject 3 Units into the skin 3 (three) times daily after meals. Hold if eats <50% of meal   levothyroxine  (SYNTHROID , LEVOTHROID) 88 MCG tablet Take 88 mcg by mouth daily before breakfast.    lisinopril  (ZESTRIL ) 5 MG tablet Take 5 mg by mouth daily.    mirtazapine  (REMERON ) 15 MG tablet Take 1 tablet by mouth at bedtime.   Multiple Vitamins-Minerals (PRESERVISION AREDS 2 PO) Take 1 tablet by mouth every evening.   nystatin  (MYCOSTATIN /NYSTOP ) powder Apply topically.   Vitamin D , Cholecalciferol , 25 MCG (1000 UT) CAPS Take 1 capsule by mouth daily.   No facility-administered encounter medications on file as of 06/18/2021.    Thank you for the opportunity to participate in the care of Ms. Nappi.  The palliative care team will  continue to follow. Please call our office at [phone] if we can be of additional assistance.   Lamarr Welby Sharps, NP DNP, AGPCNP-BC  COVID-19 PATIENT SCREENING TOOL Asked and negative response unless otherwise noted:   Have you had symptoms of covid, tested positive or been in contact with someone with symptoms/positive test in the past 5-10 days?

## 2021-06-25 ENCOUNTER — Other Ambulatory Visit: Payer: Medicare Other | Admitting: Primary Care

## 2021-06-25 ENCOUNTER — Other Ambulatory Visit: Payer: Self-pay

## 2021-06-25 DIAGNOSIS — I739 Peripheral vascular disease, unspecified: Secondary | ICD-10-CM

## 2021-06-25 DIAGNOSIS — E1151 Type 2 diabetes mellitus with diabetic peripheral angiopathy without gangrene: Secondary | ICD-10-CM

## 2021-06-25 DIAGNOSIS — I5032 Chronic diastolic (congestive) heart failure: Secondary | ICD-10-CM

## 2021-06-25 DIAGNOSIS — I63432 Cerebral infarction due to embolism of left posterior cerebral artery: Secondary | ICD-10-CM

## 2021-06-25 DIAGNOSIS — Z515 Encounter for palliative care: Secondary | ICD-10-CM

## 2021-06-25 NOTE — Progress Notes (Signed)
? ? ?Manufacturing engineer ?Community Palliative Care Consult Note ?Telephone: [phone]  ?Fax: [phone]  ? ? ?Due to the COVID-19 crisis, this visit was done via telemedicine from my office and it was initiated and consent by this patient and or family. ? ?I connected with  [name] OR PROXY on 06/25/21 by a telemedicine application and verified that I am speaking with the correct person using two identifiers. ?  ?I discussed the limitations of evaluation and management by telemedicine. The patient expressed understanding and agreed to proceed. ? ?Date of encounter: 06/25/21 ?PATIENT NAME: [name] ?[name] [address]   ?[phone] (home) [phone] (work) ?DOB: Dec 25, 1925 ?MRN: [id number] ?PRIMARY CARE PROVIDER:    ?Kathleen Hartigan, MD,  ?[address] ?[place] [address] ?[phone] ? ?REFERRING PROVIDER:   ?Kathleen Hartigan, MD ?[address] ?[place],  [place] [address] ?[phone] ? ?RESPONSIBLE PARTY:    ?Contact Information   ? ? Name Relation Home Work Mobile  ? [name] Daughter   [phone]  ? Carlson, Kathleen Conn Granddaughter   [phone]  ? Carlson Kathleen Hibbs [phone]  [phone]  ? ?  ? ? ? ?Palliative Care was asked to follow this patient by consultation request of  Feldpausch, Chrissie Noa, MD to address advance care planning. ? ?                       ASSESSMENT AND PLAN / RECOMMENDATIONS:  ? ?Advance Care Planning/Goals of Care: Goals include to maximize quality of life and symptom management. Patient/health care surrogate gave his/her permission to discuss.Our advance care planning conversation included a discussion about:    ?The value and importance of advance care planning  ?Experiences with loved ones who have been seriously ill or have died  ?Exploration of personal, cultural or spiritual beliefs that might influence medical decisions  ?Exploration of goals of care in the event of a sudden injury or illness  ?Review of an  advance  directive document . ?Decision to  de-escalate disease focused treatments due to poor prognosis. ?CODE STATUS: DNR ?Discussed hospice care and services, qualifying diagnoses and Hospice philosophy. ? ?She had an episode of not awaking and rescue took her to ED. She spontaneously awakened. Family endorses wanting to de escalate care, stop ED, stop hospitals, stop specialists. ?Family would like hospice support  and patient has voiced wanting to go when it is time, not to be prolonged. Pt husband was on hospice a few years ago. ?Patient has refused  PT and OT saying it is too tiring, from home health. ?Daughter tearful at decision and wants to discuss with siblings RE hospice choice.  Pt is clear in her choices but feels her body is declining and she is fatigued and ready for her passing.  ? ?Follow up Palliative Care Visit: Palliative care will continue to follow for complex medical decision making, advance care planning, and clarification of goals. Return 1-2 weeks or prn. ? ?I spent 25 minutes providing this consultation. More than 50% of the time in this consultation was spent in counseling and care coordination. ? ?Thank you for the opportunity to participate in the care of Ms. [name].  The palliative care team will continue to follow. Please call our office at [phone] if we can be of additional assistance.  ? ?Kathleen Coop DNP, MPH, AGPCNP-BC, ACHPN ? ? ?COVID-19 PATIENT SCREENING TOOL ?Asked and negative response unless otherwise noted:  ? ?  Have you had symptoms of covid, tested positive or been in contact with someone with symptoms/positive test in the past 5-10 days?  ? ?

## 2021-06-30 ENCOUNTER — Telehealth: Payer: Self-pay | Admitting: Primary Care

## 2021-06-30 NOTE — Telephone Encounter (Signed)
Discussed ongoing goals of care. Not apparent that she is hospice eligible yet, per report. Discussed their desire to de escalate and stop consultant visits. Voices desire to get in home care for supportive care.  ?

## 2021-07-06 ENCOUNTER — Other Ambulatory Visit: Payer: Self-pay

## 2021-07-06 ENCOUNTER — Telehealth: Payer: Self-pay

## 2021-07-06 ENCOUNTER — Other Ambulatory Visit: Payer: Medicare Other | Admitting: Primary Care

## 2021-07-06 DIAGNOSIS — S91002A Unspecified open wound, left ankle, initial encounter: Secondary | ICD-10-CM

## 2021-07-06 DIAGNOSIS — E1151 Type 2 diabetes mellitus with diabetic peripheral angiopathy without gangrene: Secondary | ICD-10-CM

## 2021-07-06 DIAGNOSIS — Z515 Encounter for palliative care: Secondary | ICD-10-CM

## 2021-07-06 DIAGNOSIS — R531 Weakness: Secondary | ICD-10-CM

## 2021-07-06 DIAGNOSIS — I63432 Cerebral infarction due to embolism of left posterior cerebral artery: Secondary | ICD-10-CM

## 2021-07-06 DIAGNOSIS — I739 Peripheral vascular disease, unspecified: Secondary | ICD-10-CM

## 2021-07-06 DIAGNOSIS — M79605 Pain in left leg: Secondary | ICD-10-CM

## 2021-07-06 NOTE — Progress Notes (Signed)
? ? ?Manufacturing engineer ?Community Palliative Care Consult Note ?Telephone: [phone]  ?Fax: [phone]  ? ? ?Date of encounter: 07/06/21 ?5:04 PM ?PATIENT NAME: [name] ?[place] [address]   ?[phone] (home) [phone] (work) ?DOB: 1925/12/26 ?MRN: [id number] ?PRIMARY CARE PROVIDER:    ?Kathleen Chou DNP ? ?REFERRING PROVIDER:   ?Kathleen Hartigan, MD ?[address] ?[place],  [place] [address] ?[phone] ? ?RESPONSIBLE PARTY:    ?Contact Information   ? ? Name Relation Home Work Mobile  ? [name] Daughter   [phone]  ? Bourn, [name] Granddaughter   [phone]  ? Isaacs Carlson,Kathleen Son [phone]  [phone]  ? ?  ? ?I met face to face with patient and family in  home. Palliative Care was asked to follow this patient by consultation request of  Feldpausch, Chrissie Noa, MD to address advance care planning and complex medical decision making. This is a follow up visit. ? ?                                 ASSESSMENT AND PLAN / RECOMMENDATIONS:  ? ?Advance Care Planning/Goals of Care: Goals include to maximize quality of life and symptom management. Patient/health care surrogate gave his/her permission to discuss.Our advance care planning conversation included a discussion about:    ?Exploration of personal, cultural or spiritual beliefs that might influence medical decisions  ?Exploration of goals of care in the event of a sudden injury or illness  ?Reiterates desire for all care in home ?CODE STATUS: DNR ? ?Symptom Management/Plan: ? ?Insomnia: Endorses frequent. Trazodone 25-50 mg po given for hs use. #30 sent to [facility] ? ?Nutrition; Eating 100%, glucose 180 pc today. Would like injectable glucogon for use if needed.Gvoke injectable pen 1 mg/0.2 mL  #1 sent to [facility]. ? ?SOB: Endorses DOE, sometimes at hs. Today lungs are clear. Family would like lasix 20 mg refilled, which I did for prn use. #30 to [facility].  I suspect some of her SOB is exertion. ? ?Mobility:  Discussed sliding transfer board. Family to f/u. D/c from home health this week.  ? ?Labs : Were not gotten by Home health, will send our RN out to get them. Needs cbc and cmp. ? ?Wound: Has medial L malleolus wound, covered, but family Is managing.  Area of slough over 4 mm round area, scant drainage, no odor, no tenderness, surrounding skin intact. Has slight to 1 + edema  on LE. Asked to elevate, will refer back to wound management if needed but pt has voiced goal include cessation of outside appts. ? ?Edema: has LE edema, 1+. Reports some wheezing/fluid in lungs sensation. Recommend zipper light compression sock, 15-20 mmHg ? ?Pain: Endorses having phantom pain improved s/p CVA. Her CVA in 1/23 left her with R sided effects, including reducing eyesight and improvement of phantom pains. ? ?Follow up Palliative Care Visit: Palliative care will continue to follow for complex medical decision making, advance care planning, and clarification of goals. Return 4 weeks or prn. ? ?I spent 60 minutes providing this consultation. More than 50% of the time in this consultation was spent in counseling and care coordination. ? ?PPS: 30% ? ?HOSPICE ELIGIBILITY/DIAGNOSIS: TBD ? ?Chief Complaint: debility, fatigue ? ?HISTORY OF PRESENT ILLNESS:  [name] is a 88 y.o. year old female  with DM, with insulin use, CHF, L ankle wound, fatigue, immobility . Patient seen  today to review palliative care needs to include medical decision making and advance care planning as appropriate.  ? ?History obtained from review of EMR, discussion with primary team, and interview with family, facility staff/caregiver and/or [name].  ?I reviewed available labs, medications, imaging, studies and related documents from the EMR.  Records reviewed and summarized above.  ? ?ROS ? ? ?General: NAD ?EYES: endorses R eye vision changes ?ENMT: denies dysphagia ?Cardiovascular: denies chest pain,  endorses DOE ?Pulmonary: denies cough, endorses   increased SOB ?Abdomen: endorses good appetite, denies constipation, endorses continence of bowel ?GU: denies dysuria, endorses continence of urine (uses depend at night so as not to get oob) ?MSK:  endorses  increased weakness,  no falls reported ?Skin: denies rashes, endorses L ankle  wound ?Neurological: endorses occ  pain,  endorses insomnia ?Psych: Endorses discouraged mood ? ?Physical Exam: ?Current and past weights:unavailable ?Constitutional: NAD ?General: frail appearing, thin ?EYES: anicteric sclera, lids intact, no discharge  ?ENMT: hard of hearing, oral mucous membranes dry , dentition intact ?CV: S1S2, RRR, 1+ L  LE edema ?Pulmonary: LCTA, no increased work of breathing, no cough, room air ?Abdomen: intake 100%,no ascites ?MSK: + sarcopenia, moves all extremities, R AKA,non -ambulatory ?Skin: warm and dry, no rashes on visible skin, wound as above ?Neuro:  + generalized weakness,  no cognitive impairment, slight anxious affect ? ?Thank you for the opportunity to participate in the care of [name].  The palliative care team will continue to follow. Please call our office at [phone] if we can be of additional assistance.  ? ?Kathleen Coop, NP DNP, AGPCNP-BC ? ?COVID-19 PATIENT SCREENING TOOL ?Asked and negative response unless otherwise noted:  ? ?Have you had symptoms of covid, tested positive or been in contact with someone with symptoms/positive test in the past 5-10 days?  ? ?

## 2021-07-06 NOTE — Telephone Encounter (Signed)
350 pm.  Phone call made to son to advise NP will be closer to 5.  Son agreeable.  ?

## 2021-07-07 ENCOUNTER — Telehealth: Payer: Self-pay

## 2021-07-07 NOTE — Telephone Encounter (Signed)
Alvester Chou, NP has ordered CBC with Diff, CMP, HgA1c, TSH, vit D and vit b12.  Phone call made to patient to schedule a home visit for blood work.  Patient agreeable to 4/6@ 1130 am. ?

## 2021-07-13 ENCOUNTER — Telehealth: Payer: Self-pay | Admitting: Family

## 2021-07-13 ENCOUNTER — Ambulatory Visit: Payer: Medicare Other | Admitting: Family

## 2021-07-13 NOTE — Telephone Encounter (Signed)
Patient did not show for her [facility] appointment on 07/13/21. Will attempt to reschedule.   ?

## 2021-07-13 NOTE — Progress Notes (Deleted)
? Patient ID: [name], female    DOB: 1925/12/10, 88 y.o.   MRN: [id number] ? ?HPI ? ?Ms Turcott is a 88 y/o female with a history of DM, HTN, stroke, atrial fibrillation and chronic heart failure.  ? ?Echo report from 04/15/21 reviewed and showed an EF of 50-55% along with mild MR and mild/moderate AS. No wall motion abnormalities.  ? ?Was in the ED 06/14/21 due to AMS. MRI negative. Slight dehydration and mental status improved and she was released. Admitted 05/30/21 due to SOB due to fluid overload, right-sided pleural effusion with possibly multifocal pneumonia. Initially given IV lasix and IV antibiotics. Pulmonology consult obtained. Thoracentesis on 2/19 and 650 cc was removed. PT/OT/speech evaluations done. Weaned off of oxygen. Diuretic and antibiotics were changed to oral formulation. Discharged after 4 days.  ? ?She presents for a follow-up visit with a chief complaint of  ? ?Past Medical History:  ?Diagnosis Date  ? A-fib ([place])   ? CHF (congestive heart failure) ([place])   ? Diabetes mellitus without complication ([place])   ? Hypertension   ? Stroke [facility])   ? ?Past Surgical History:  ?Procedure Laterality Date  ? AMPUTATION Right 10/10/2019  ? Procedure: AMPUTATION ABOVE KNEE;  Surgeon: Katha Cabal, MD;  Location: [facility] ORS;  Service: Vascular;  Laterality: Right;  ? CHOLECYSTECTOMY    ? LOWER EXTREMITY ANGIOGRAPHY Right 08/12/2017  ? Procedure: LOWER EXTREMITY ANGIOGRAPHY;  Surgeon: Katha Cabal, MD;  Location: [place] [facility];  Service: Cardiovascular;  Laterality: Right;  ? LOWER EXTREMITY ANGIOGRAPHY Right 06/13/2018  ? Procedure: LOWER EXTREMITY ANGIOGRAPHY;  Surgeon: Katha Cabal, MD;  Location: [place] [facility];  Service: Cardiovascular;  Laterality: Right;  ? LOWER EXTREMITY ANGIOGRAPHY Right 03/01/2019  ? Procedure: LOWER EXTREMITY ANGIOGRAPHY;  Surgeon: Katha Cabal, MD;  Location: [place] [facility];  Service: Cardiovascular;  Laterality: Right;  ? LOWER EXTREMITY  ANGIOGRAPHY Right 07/31/2019  ? Procedure: LOWER EXTREMITY ANGIOGRAPHY;  Surgeon: Katha Cabal, MD;  Location: [place] [facility];  Service: Cardiovascular;  Laterality: Right;  ? LOWER EXTREMITY ANGIOGRAPHY Right 10/04/2019  ? Procedure: Lower Extremity Angiography;  Surgeon: Katha Cabal, MD;  Location: [place] [facility];  Service: Cardiovascular;  Laterality: Right;  ? ?Family History  ?Problem Relation Age of Onset  ? Cancer Brother   ? ?Social History  ? ?Tobacco Use  ? Smoking status: Never  ? Smokeless tobacco: Never  ?Substance Use Topics  ? Alcohol use: No  ? ?Allergies  ?Allergen Reactions  ? Sulfa Antibiotics Hives  ? ? ?Review of Systems  ?Constitutional:  Positive for fatigue. Negative for appetite change.  ?HENT:  Negative for congestion, postnasal drip and sore throat.   ?Eyes: Negative.   ?Respiratory:  Positive for cough (dry). Negative for shortness of breath.   ?Cardiovascular:  Positive for leg swelling. Negative for chest pain and palpitations.  ?Gastrointestinal:  Negative for abdominal distention and abdominal pain.  ?Endocrine: Negative.   ?Genitourinary: Negative.   ?Musculoskeletal:  Negative for back pain and neck pain.  ?Skin: Negative.   ?Allergic/Immunologic: Negative.   ?Neurological:  Positive for light-headedness. Negative for dizziness.  ?Hematological:  Negative for adenopathy. Bruises/bleeds easily.  ?Psychiatric/Behavioral:  Negative for decreased concentration and sleep disturbance (sleeping on 2 pillows). The patient is not nervous/anxious.   ? ? ? ?Physical Exam ?Vitals and nursing note reviewed. Exam conducted with a chaperone present (son).  ?Constitutional:   ?   Appearance: Normal appearance.  ?  HENT:  ?   Head: Normocephalic and atraumatic.  ?Cardiovascular:  ?   Rate and Rhythm: Normal rate and regular rhythm.  ?Pulmonary:  ?   Effort: Pulmonary effort is normal. No respiratory distress.  ?   Breath sounds: No wheezing or rales.  ?Abdominal:  ?   General:  There is no distension.  ?   Palpations: Abdomen is soft.  ?Musculoskeletal:     ?   General: Deformity (right leg AKA) present. No tenderness.  ?   Cervical back: Normal range of motion and neck supple.  ?   Left lower leg: Edema (1+ pitting) present.  ?Skin: ?   General: Skin is warm and dry.  ?Neurological:  ?   General: No focal deficit present.  ?   Mental Status: She is alert and oriented to person, place, and time. Mental status is at baseline.  ?Psychiatric:     ?   Mood and Affect: Mood normal.     ?   Behavior: Behavior normal.     ?   Thought Content: Thought content normal.  ? ?Assessment & Plan: ? ?1: Chronic heart failure with preserved ejection fraction without structural changes- ?- NYHA class II ?- euvolemic today ?- has scales but hasn't been weighing due to begin unsteady; suggested that they put her walker over the scale and see if she can weigh safely that way so she can call for an overnight weight gain of > 2 pounds or a weekly weight gain of > 5 pounds; if any concern about her safely standing, advised to NOT weigh ? ?- not adding salt ?- palliative care visit done 07/06/21 ?- carvedilol 3.125mg  resumed at last visit ?- consider SGLT2 ?- BNP 05/31/21 was 601.3 ? ? ?2: HTN- ?- BP  ?- saw PCP (Feldpausch) 05/28/21 ?- saw nephrology Holley Raring) 03/19/22 ?- BMP 06/14/21 reviewed and showed sodium 145, potassium 4.5, creatinine 1.33 and GFR 37 ? ?3: DM- ?- saw endocrinology Honor Junes) 05/19/21 ?- A1c 05/19/21 was 7.8% ? ?4: Atrial fibrillation- ?- saw cardiology Nehemiah Massed) 11/05/20; returns 07/07/21 ? ? ?Patient did not bring her medications nor a list. Each medication was verbally reviewed with the patient and she was encouraged to bring the bottles to every visit to confirm accuracy of list.  ? ? ?

## 2021-07-14 ENCOUNTER — Other Ambulatory Visit: Payer: Medicare Other | Admitting: Primary Care

## 2021-07-16 ENCOUNTER — Other Ambulatory Visit: Payer: Medicare Other

## 2021-07-16 DIAGNOSIS — Z515 Encounter for palliative care: Secondary | ICD-10-CM

## 2021-07-16 NOTE — Progress Notes (Signed)
PATIENT NAME: [name] ?DOB: 10-Oct-1925 ?MRN: [id number] ? ?PRIMARY CARE PROVIDER: Sofie Hartigan, MD ? ?RESPONSIBLE PARTY:  ?Acct ID - Guarantor Home Phone Work Phone Relationship Acct Type  ?[id number] SHAKEELA, RABADAN [phone]  Self P/F  ?   [place], [place], [place] [address]  ? ? ?Home visit made to obtain blood ordered by Alvester Chou, NP.  Explained procedure to patient and she is agreeable to proceed.  Accessed after 1st attempt.  Patient tolerated well.  Blood work taken to [facility] for processing.  Results will be faxed to Palliative Care office and Alvester Chou, NP. ? ? ? ?Lorenza Burton, RN ? ?

## 2021-07-23 ENCOUNTER — Non-Acute Institutional Stay: Payer: Medicare Other | Admitting: Primary Care

## 2021-07-23 DIAGNOSIS — I63432 Cerebral infarction due to embolism of left posterior cerebral artery: Secondary | ICD-10-CM

## 2021-07-23 DIAGNOSIS — E1151 Type 2 diabetes mellitus with diabetic peripheral angiopathy without gangrene: Secondary | ICD-10-CM

## 2021-07-23 DIAGNOSIS — I5032 Chronic diastolic (congestive) heart failure: Secondary | ICD-10-CM

## 2021-07-23 DIAGNOSIS — Z515 Encounter for palliative care: Secondary | ICD-10-CM

## 2021-07-23 DIAGNOSIS — R531 Weakness: Secondary | ICD-10-CM

## 2021-07-23 DIAGNOSIS — I739 Peripheral vascular disease, unspecified: Secondary | ICD-10-CM

## 2021-07-23 NOTE — Progress Notes (Addendum)
? ? ?Manufacturing engineer ?Community Palliative Care Consult Note ?Telephone: [phone]  ?Fax: [phone]  ? ? ?Date of encounter: 07/23/21 ?4:07 PM ?PATIENT NAME: [name] ?[place] [address]   ?[phone] (home) [phone] (work) ?DOB: 01-Apr-1926 ?MRN: [id number] ?PRIMARY CARE PROVIDER:    ?Kathleen Chou, NP] ?[address] ?[place] [address] ?[phone] ? ? ?REFERRING PROVIDER:   ?Kathleen Hartigan, MD ?[address] ?[place],  [place] [address] ?[phone] ? ?RESPONSIBLE PARTY:    ?Contact Information   ? ? Name Relation Home Work Mobile  ? [name] Daughter   [phone]  ? [name] Granddaughter   [phone]  ? [name] [id number]  [phone]  ? ?  ? ? ?I met face to face with patient and family in  home. Palliative Care was asked to follow this patient by consultation request of  Feldpausch, Chrissie Noa, MD to address advance care planning and complex medical decision making. This is a follow up visit. ? ?                                 ASSESSMENT AND PLAN / RECOMMENDATIONS:  ? ?Advance Care Planning/Goals of Care: Goals include to maximize quality of life and symptom management. Patient/health care surrogate gave his/her permission to discuss.Our advance care planning conversation included a discussion about:    ? ?Experiences with loved ones who have been seriously ill or have died  ?Exploration of personal, cultural or spiritual beliefs that might influence medical decisions  ?Exploration of goals of care in the event of a sudden injury or illness  ?Identification of a healthcare agent - son ?Decision  to de-escalate disease focused treatments due to poor prognosis. ?CODE STATUS: DNR ?Goals of Care: We again discussed their request for Hospice services. She has had some recent lab work which now corroborates some decline  in renal function and in albumin level. Our medical director has deemed that she is now exhibiting signs of decline  that would indicate a less than six month prognosis. Both she and family to want to avoid Hospice services. I've reached out to PCP and also let her know of their decision.  We reviewed services hospice will supply to support the family in caregiving for the patient. Her husband received  services from our Hospice at his EOL. Son states that his birthday is this week and that his mother may be making her way due to this. ? ?I spent 20 minutes providing this consultation. More than 50% of the time in this consultation was spent in counseling and care coordination. ?--------------------------------------------------------------------------------- ? ?Symptom Management/Plan: ? ?Decline: Family called today to report patient is ?not doing well ? and needs a visit. I was due to see her next week. Patient today shows continued decline. She endorses increased weakness and is now not able to transfer herself at all; prior to her CVA in January she was independent in transfers. She appears to have lost weight, and is  especially cachectic in her face.  ? ?Nutrition: Her intake is 50% or less and this is with coaching / queuing from her family.  Albumin is 3.3. Renal function is declining  and po intake is declining.  ? ?SOB: Son endorses some shortness of breath but that her pulse oximetry was 95% on room air. We discussed that maybe this was due to anxiety and he was in  agreement. I let him know that if she would like, Hospice can bring out oxygen if it is a comfort measure. ? ?Headache: She endorses a headache but has no gross neuro changes. However the goal of care is to avoid hospitalization and family and she elected not to treat a future recurrent stroke. She had a TIA about a month ago and they did have come out  to assess but then elected not to transport. PERRL. Meanwhile for her bad  headache I sent in Norco 5 mg/ 325 mg, po # 25 Q4 to six hours PRN pain/headache to [facility] in [place]. Discussed with  family stroke risks. She remains on Eliquis at this writing. ? ? ?Follow up Palliative Care Visit: . Return  prn if  Not admitted to hospice. Hand off to hospice services. ? ?This visit was coded based on medical decision making (MDM). ? ?PPS: 30% ? ?HOSPICE ELIGIBILITY/DIAGNOSIS: yes/vascular disease ? ?Chief Complaint: CHF, DM, COPD, vascular disease ? ?HISTORY OF PRESENT ILLNESS:  [name] is a 88 y.o. year old female  with CHF, DM, COPD, vascular disease, CVA in 1/23, immobility, FTT . Patient seen today to review palliative care needs to include medical decision making and advance care planning as appropriate.  ? ?History obtained from review of EMR, discussion with primary team, and interview with family, facility staff/caregiver and/or [name].  ?I reviewed available labs, medications, imaging, studies and related documents from the EMR.  Records reviewed and summarized above.  ? ?ROS ? ? ?General: ill appearing ?ENMT: denies dysphagia, poor intake ?Cardiovascular: denies chest pain, denies DOE ?Pulmonary: denies cough, denies increased SOB ?Abdomen: endorses poor appetite, denies constipation, endorses continence of bowel ?GU: denies dysuria, endorses continence of urine ?MSK:  endorses  increased weakness,  no falls reported ?Skin: denies rashes or wounds ?Neurological: endorses headache  pain, endorses insomnia ?Psych: Endorses  flat  mood ? ?Physical Exam: ?Current and past weights: 127 lbs 6 weeks ago, not able due to debility. Visible loss since last visit.  ?Constitutional: ill appearing ?General: frail appearing, thin ?EYES: anicteric sclera, lids intact, no discharge , PERRL ?ENMT: intact hearing, oral mucous membranes dry dentition intact ?CV: S1S2, RRR, no LE edema ?Pulmonary: LCTA, no increased work of breathing, no cough, room air ?Abdomen: intake <25%, soft and non tender, no ascites ?MSK: + sarcopenia, moves all extremities, non  ambulatory, R AKA ?Skin: warm and dry, no rashes or wounds  on visible skin ?Neuro:  + generalized weakness,  no cognitive impairment, anxious affect ? ? ?Thank you for the opportunity to participate in the care of Ms. Meeuwsen.  The palliative care team will continue to follow. Please call our office at [phone] if we can be of additional assistance.  ? ?Jason Coop, NP DNP, AGPCNP-BC ? ?COVID-19 PATIENT SCREENING TOOL ?Asked and negative response unless otherwise noted:  ? ?Have you had symptoms of covid, tested positive or been in contact with someone with symptoms/positive test in the past 5-10 days?  ? ?

## 2021-07-31 ENCOUNTER — Other Ambulatory Visit: Payer: Medicare Other | Admitting: Primary Care

## 2021-11-10 DEATH — deceased

## 2022-02-08 ENCOUNTER — Encounter (INDEPENDENT_AMBULATORY_CARE_PROVIDER_SITE_OTHER): Payer: Self-pay

## 2022-10-26 IMAGING — DX DG CHEST 1V
1 series · 1 of 1 positions shown · non-contrast
Comparison: 05/31/2021 and prior studies

CLINICAL DATA: Status post RIGHT thoracentesis.

EXAM:
CHEST  1 VIEW

[chest ap]
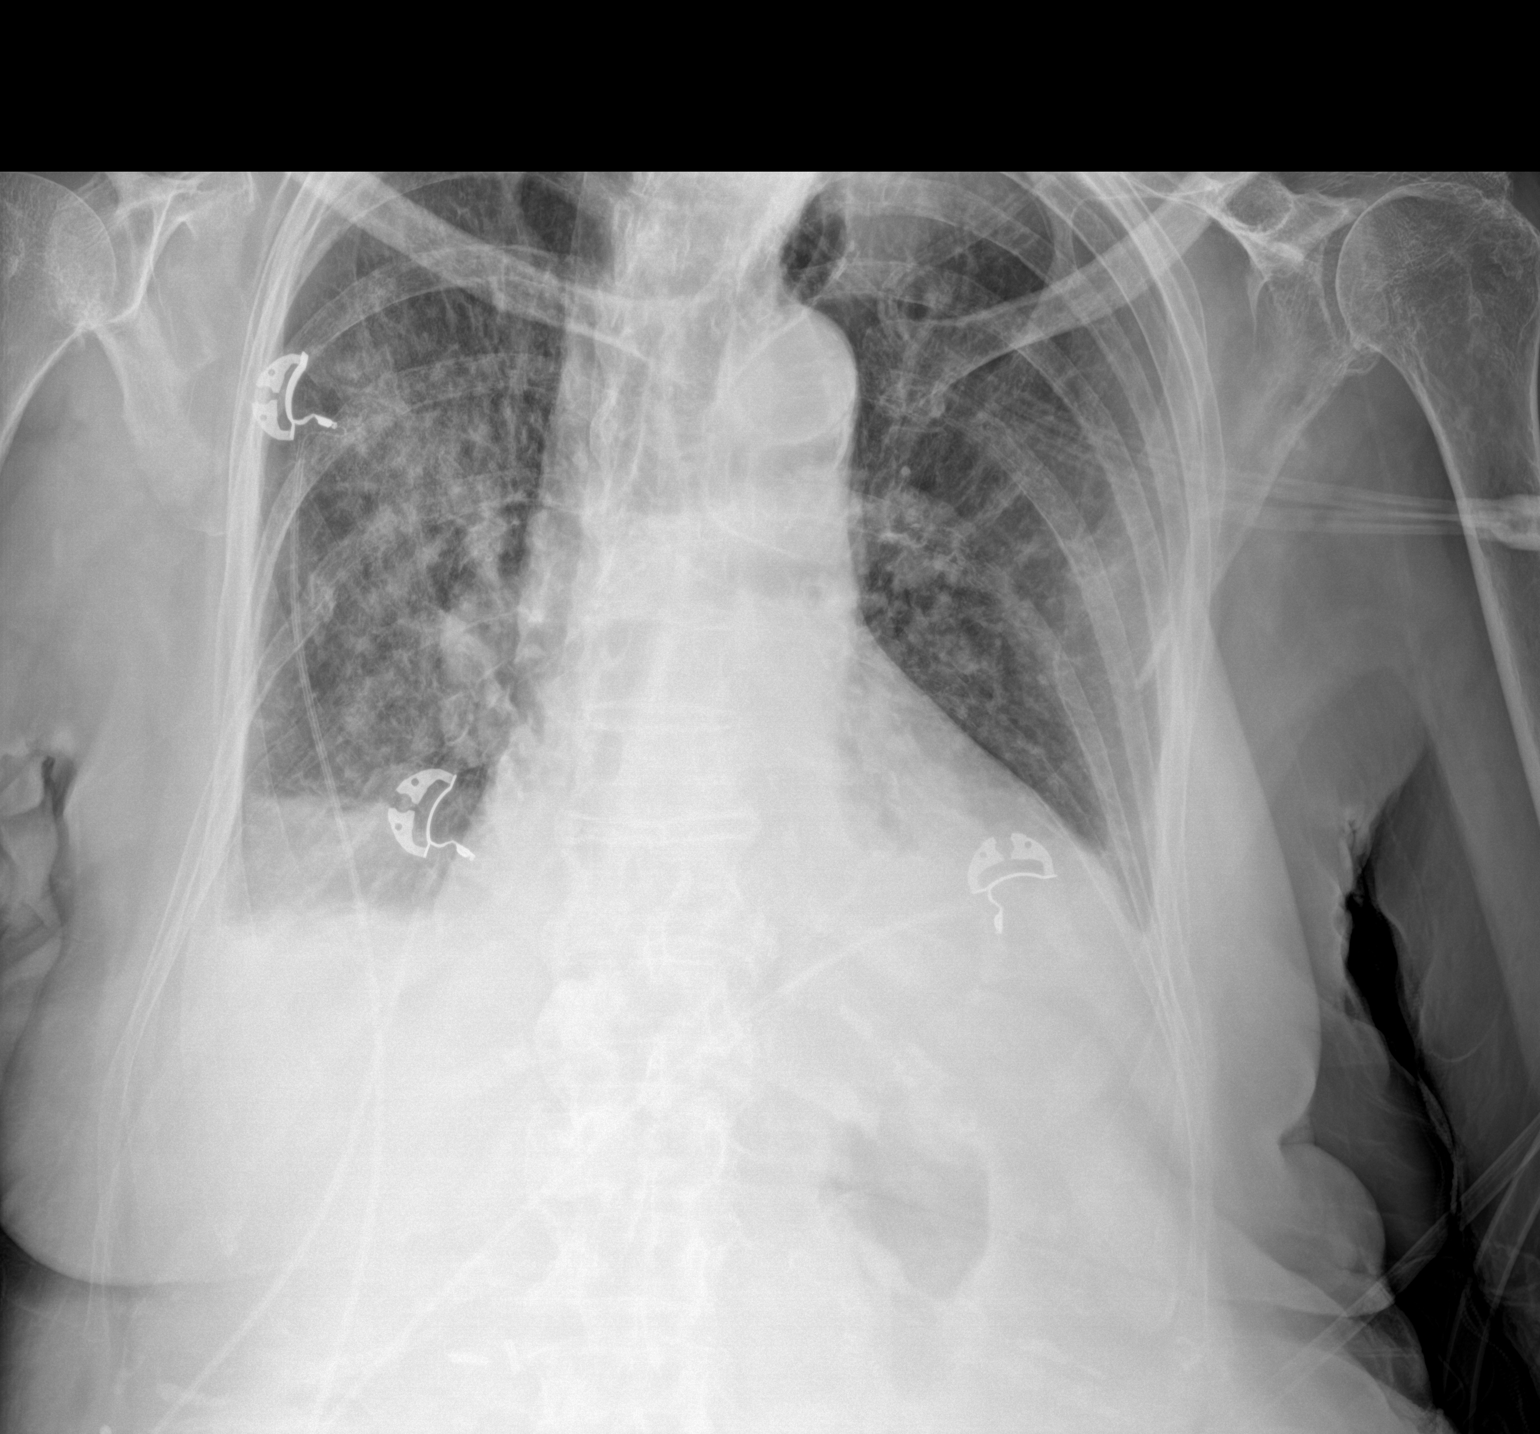

[1 of 1 positions shown; findings below may reference images not displayed]

FINDINGS: Cardiomegaly again noted.

Bilateral airspace opacities are slightly improved.

RIGHT pleural effusion appears somewhat decreased.

Bilateral LOWER lung consolidation/atelectasis again noted.

There is no evidence of pneumothorax.
IMPRESSION: Slightly improved bilateral airspace opacities and likely decreased
RIGHT pleural effusion. No pneumothorax.

## 2022-10-26 IMAGING — DX DG CHEST 1V PORT
2 series · 2 of 2 positions shown · non-contrast
Comparison: Chest CT and portable radiographs yesterday and
earlier.

CLINICAL DATA: [AGE] female with hypoxia.

EXAM:
PORTABLE CHEST 1 VIEW

[chest ap (1 of 2)]
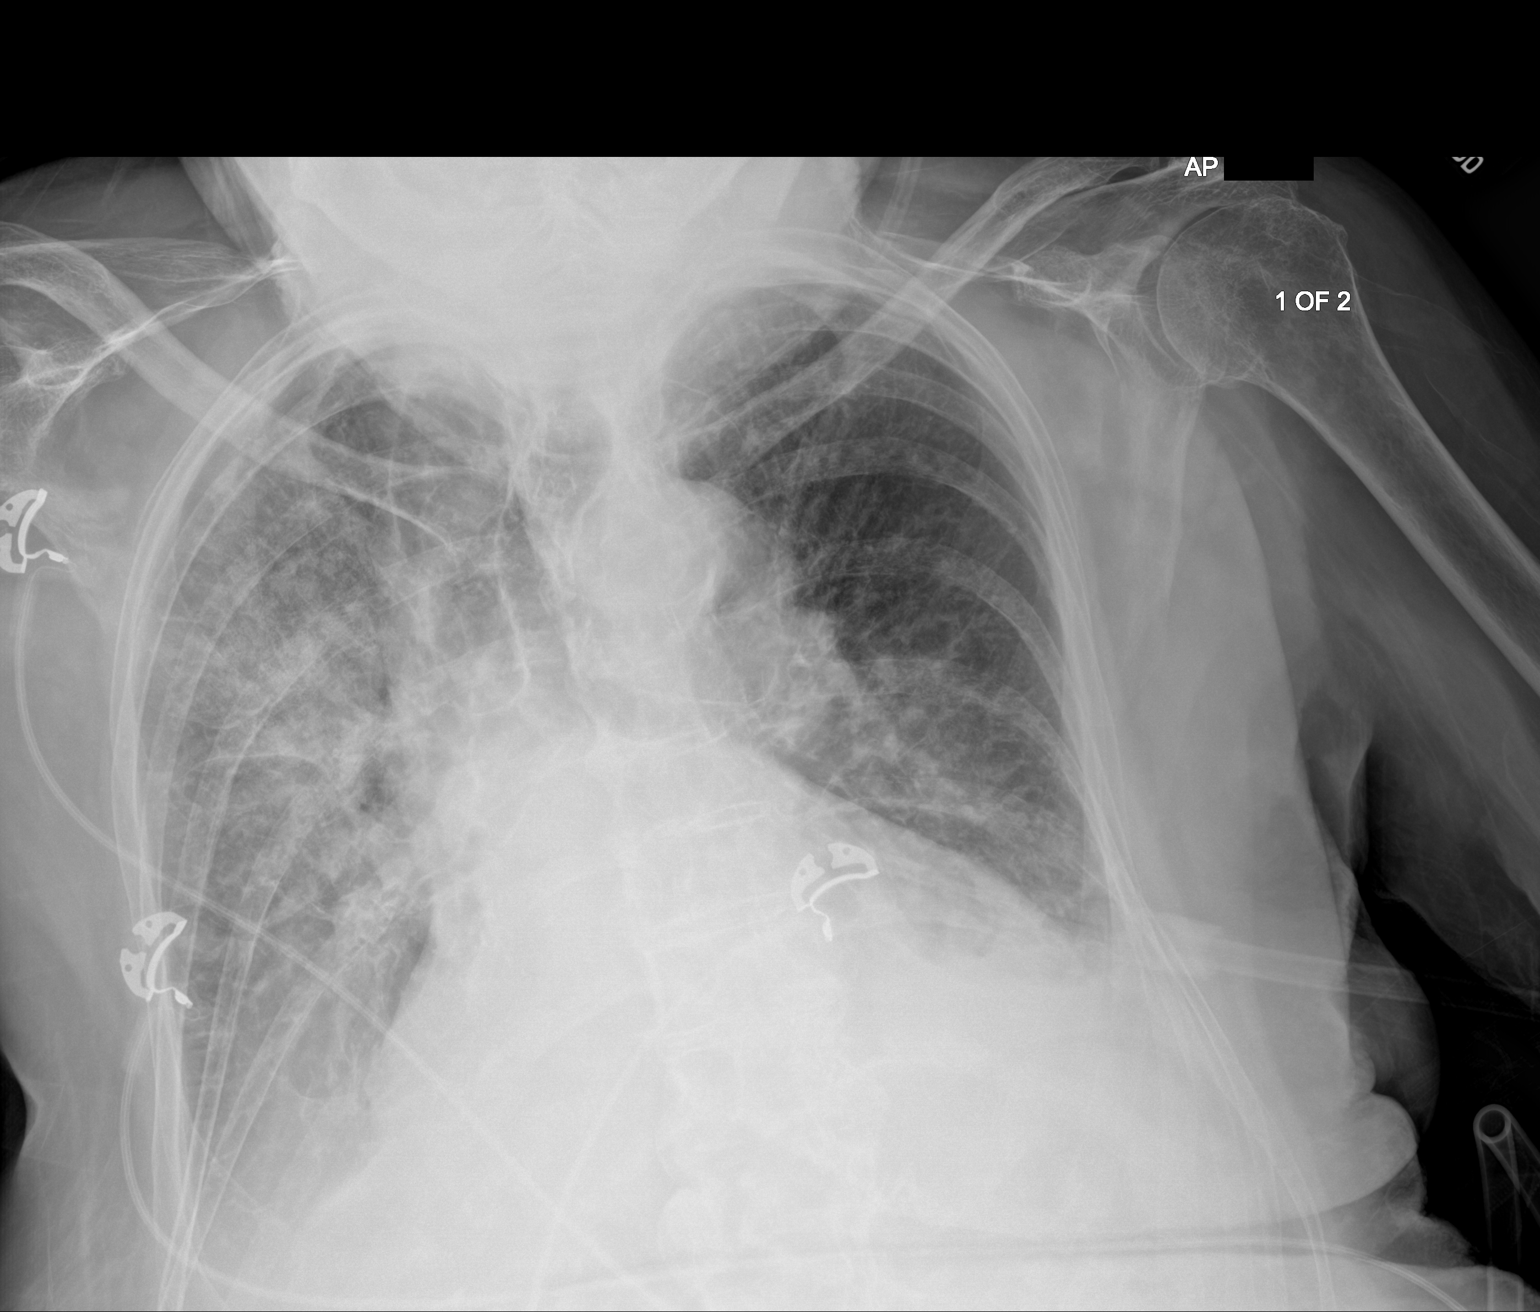

[chest ap (2 of 2)]
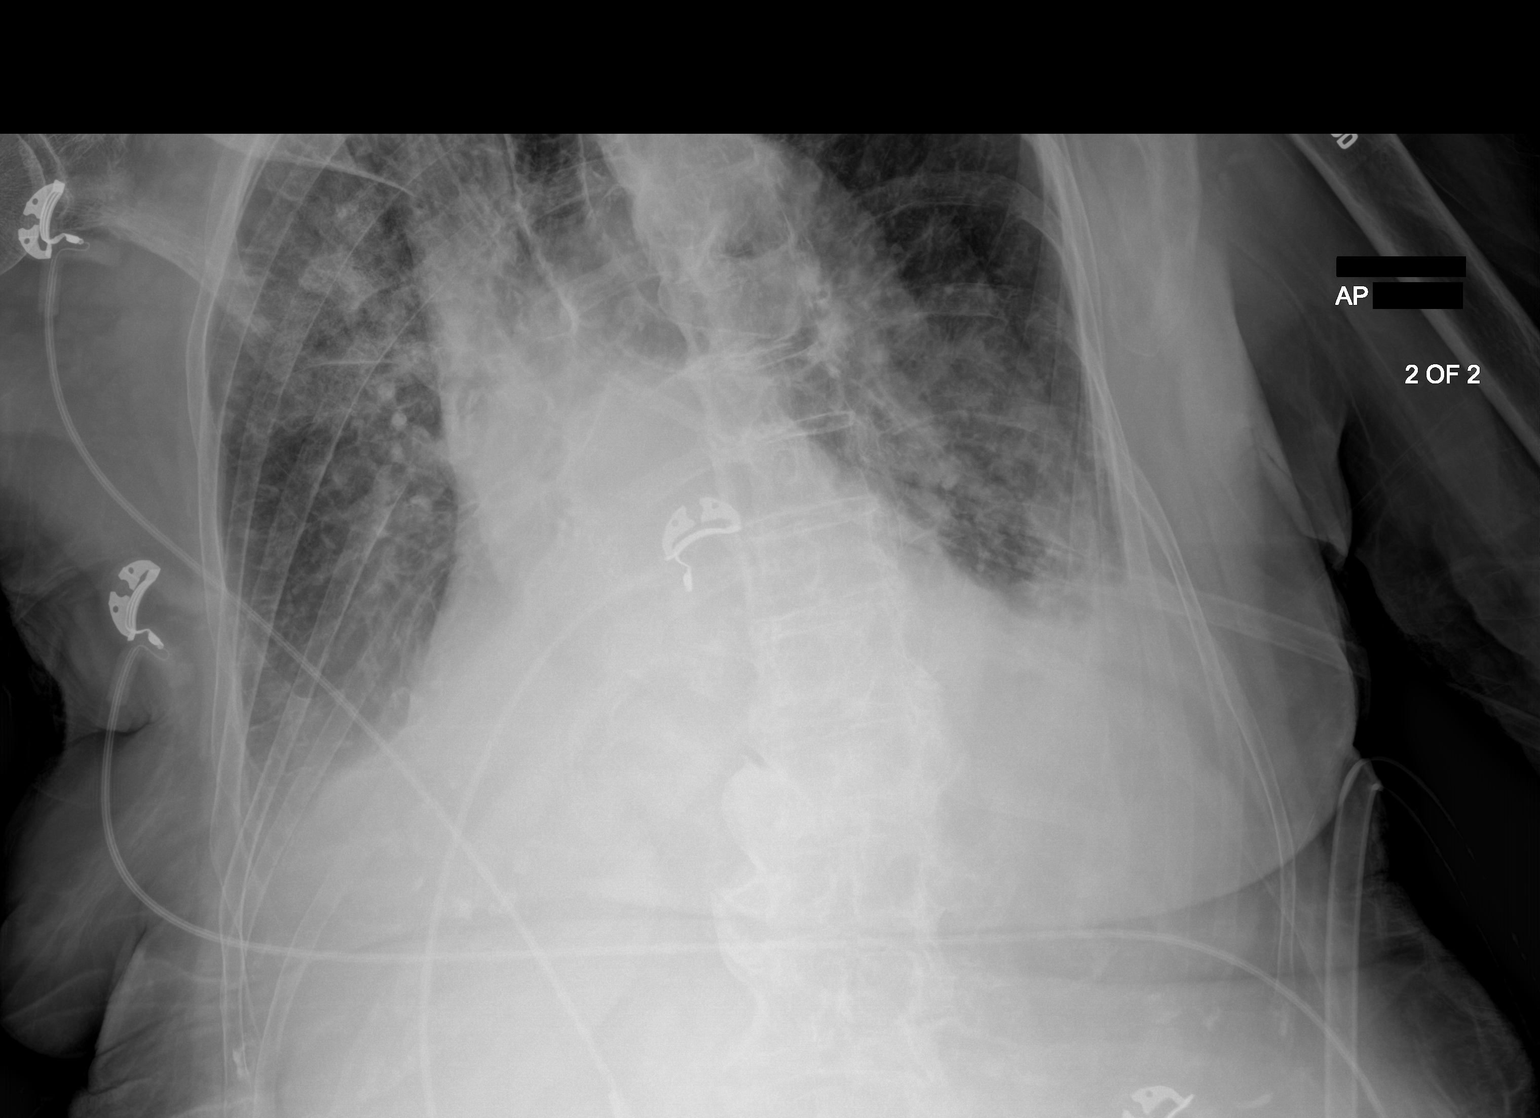

[2 of 2 positions shown; findings below may reference images not displayed]

FINDINGS: Portable AP upright view at 7841 hours. Combined veiling and
confluent opacity throughout the right lung compatible with
multilobar infection and pleural effusion demonstrated by CT
yesterday. Lower lung volumes and right lung base ventilation has
worsened. Contralateral left lower lobe atelectasis versus
consolidation.

Stable cardiomegaly and mediastinal contours. No pneumothorax.
Stable visualized osseous structures.
IMPRESSION: Lower lung volumes with worsening right lung ventilation from the CT
yesterday most suggestive of multilobar right lung infection with
pleural effusion. Ongoing left lung base atelectasis versus
infection.

## 2022-10-28 IMAGING — DX DG CHEST 1V PORT
1 series · 1 of 1 positions shown · non-contrast
Comparison: Previous studies including the examination done on
05/31/2021

CLINICAL DATA: Shortness of breath, hypoxia

EXAM:
PORTABLE CHEST 1 VIEW

[chest ap]
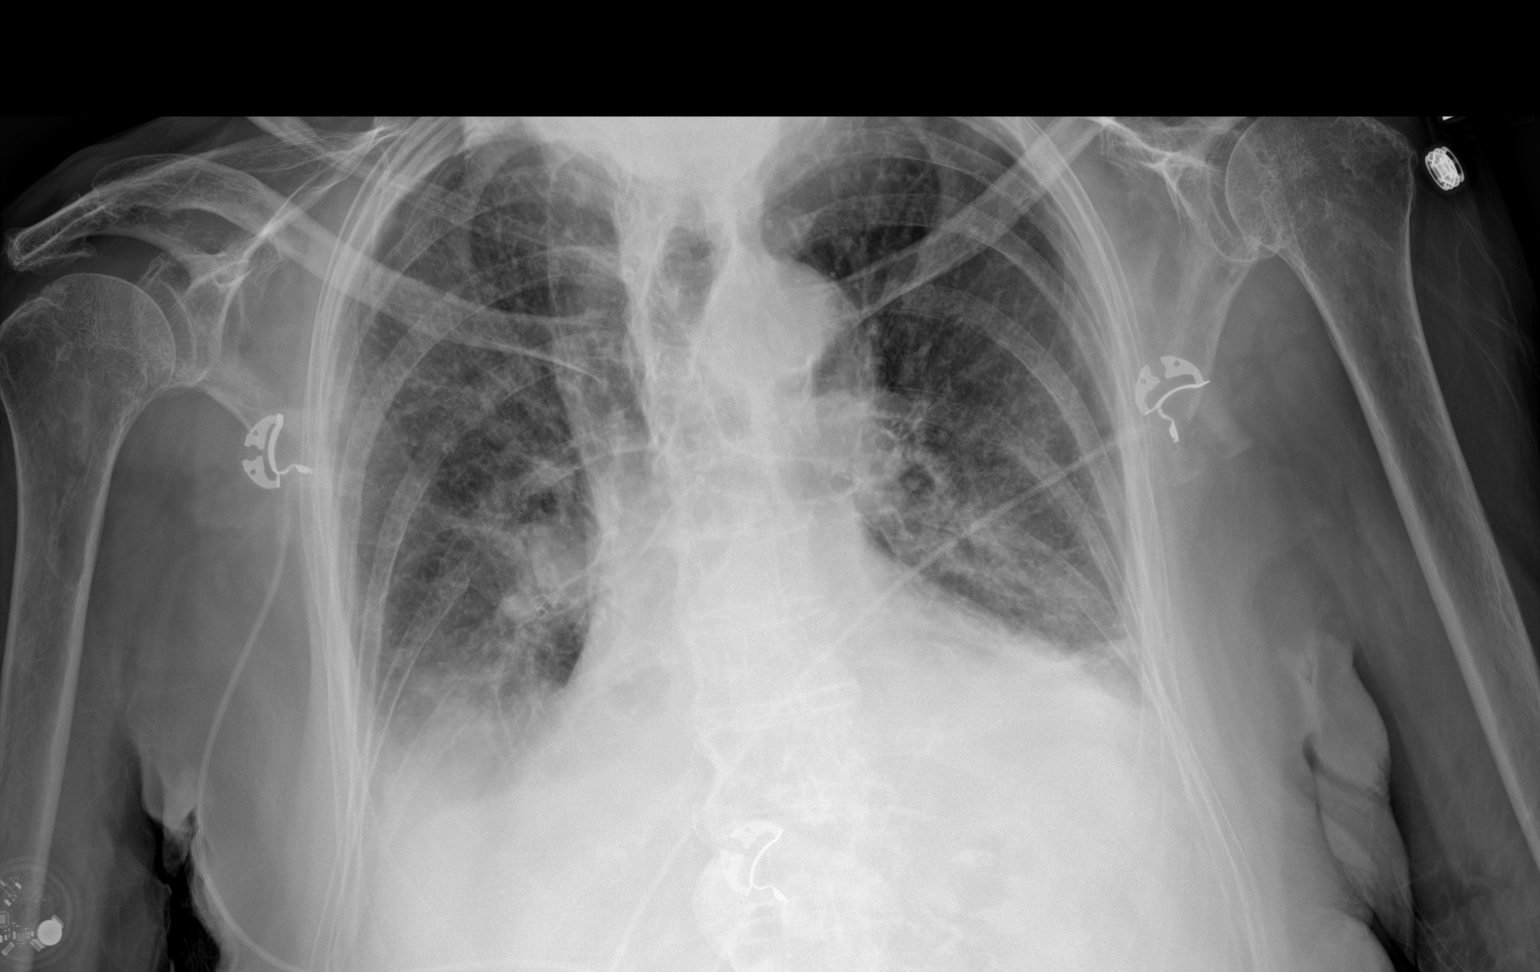

[1 of 1 positions shown; findings below may reference images not displayed]

FINDINGS: Transverse diameter of heart is increased. There is interval
improvement in aeration in the right parahilar region. There is
prominence of central pulmonary vessels. There is residual
prominence of interstitial markings in both parahilar regions and
lower lung fields. There is blunting of lateral CP angles. There is
no pneumothorax.
IMPRESSION: Cardiomegaly. There is improvement in aeration of right parahilar
region suggesting resolving pneumonia or resolving asymmetric
pulmonary edema. There is residual prominence of interstitial
markings in both parahilar regions and lower lung fields suggesting
residual interstitial edema or interstitial pneumonia. Small
bilateral pleural effusions are seen.
